# Patient Record
Sex: Female | Born: 1950 | Race: White | Hispanic: No | Marital: Married | Smoking: Never smoker
Health system: Southern US, Community
[De-identification: ages and names within clinical notes are randomized; demographics above are authoritative.]

## PROBLEM LIST (undated history)

## (undated) DIAGNOSIS — K219 Gastro-esophageal reflux disease without esophagitis: Secondary | ICD-10-CM

## (undated) DIAGNOSIS — D649 Anemia, unspecified: Secondary | ICD-10-CM

## (undated) DIAGNOSIS — Z87442 Personal history of urinary calculi: Secondary | ICD-10-CM

## (undated) DIAGNOSIS — M722 Plantar fascial fibromatosis: Secondary | ICD-10-CM

## (undated) DIAGNOSIS — E782 Mixed hyperlipidemia: Secondary | ICD-10-CM

## (undated) DIAGNOSIS — I10 Essential (primary) hypertension: Secondary | ICD-10-CM

## (undated) DIAGNOSIS — E119 Type 2 diabetes mellitus without complications: Secondary | ICD-10-CM

## (undated) DIAGNOSIS — H269 Unspecified cataract: Secondary | ICD-10-CM

## (undated) DIAGNOSIS — E785 Hyperlipidemia, unspecified: Secondary | ICD-10-CM

## (undated) DIAGNOSIS — R011 Cardiac murmur, unspecified: Secondary | ICD-10-CM

## (undated) DIAGNOSIS — I350 Nonrheumatic aortic (valve) stenosis: Secondary | ICD-10-CM

## (undated) DIAGNOSIS — T7840XA Allergy, unspecified, initial encounter: Secondary | ICD-10-CM

## (undated) DIAGNOSIS — R519 Headache, unspecified: Secondary | ICD-10-CM

## (undated) DIAGNOSIS — G4730 Sleep apnea, unspecified: Secondary | ICD-10-CM

## (undated) DIAGNOSIS — M7582 Other shoulder lesions, left shoulder: Secondary | ICD-10-CM

## (undated) DIAGNOSIS — M1990 Unspecified osteoarthritis, unspecified site: Secondary | ICD-10-CM

## (undated) HISTORY — DX: Sleep apnea, unspecified: G47.30

## (undated) HISTORY — DX: Mixed hyperlipidemia: E78.2

## (undated) HISTORY — PX: UPPER GASTROINTESTINAL ENDOSCOPY: SHX188

## (undated) HISTORY — DX: Other shoulder lesions, left shoulder: M75.82

## (undated) HISTORY — DX: Anemia, unspecified: D64.9

## (undated) HISTORY — DX: Plantar fascial fibromatosis: M72.2

## (undated) HISTORY — PX: NECK SURGERY: SHX720

## (undated) HISTORY — DX: Nonrheumatic aortic (valve) stenosis: I35.0

## (undated) HISTORY — PX: BILATERAL CARPAL TUNNEL RELEASE: SHX6508

## (undated) HISTORY — DX: Unspecified cataract: H26.9

## (undated) HISTORY — PX: COLONOSCOPY: SHX174

## (undated) HISTORY — DX: Type 2 diabetes mellitus without complications: E11.9

## (undated) HISTORY — DX: Essential (primary) hypertension: I10

## (undated) HISTORY — DX: Unspecified osteoarthritis, unspecified site: M19.90

## (undated) HISTORY — PX: WRIST SURGERY: SHX841

## (undated) HISTORY — PX: OTHER SURGICAL HISTORY: SHX169

## (undated) HISTORY — PX: ANTERIOR CERVICAL DECOMP/DISCECTOMY FUSION: SHX1161

## (undated) HISTORY — PX: TRIGGER FINGER RELEASE: SHX641

## (undated) HISTORY — DX: Gastro-esophageal reflux disease without esophagitis: K21.9

## (undated) HISTORY — DX: Allergy, unspecified, initial encounter: T78.40XA

## (undated) HISTORY — DX: Cardiac murmur, unspecified: R01.1

## (undated) HISTORY — DX: Hyperlipidemia, unspecified: E78.5

---

## 1997-11-21 ENCOUNTER — Other Ambulatory Visit: Admission: RE | Admit: 1997-11-21 | Discharge: 1997-11-21 | Payer: Self-pay | Admitting: Obstetrics and Gynecology

## 1998-08-11 ENCOUNTER — Ambulatory Visit (HOSPITAL_COMMUNITY): Admission: RE | Admit: 1998-08-11 | Discharge: 1998-08-11 | Payer: Self-pay | Admitting: Obstetrics and Gynecology

## 1999-04-03 ENCOUNTER — Other Ambulatory Visit: Admission: RE | Admit: 1999-04-03 | Discharge: 1999-04-03 | Payer: Self-pay | Admitting: Obstetrics and Gynecology

## 2000-02-25 ENCOUNTER — Encounter: Admission: RE | Admit: 2000-02-25 | Discharge: 2000-02-25 | Payer: Self-pay | Admitting: Family Medicine

## 2000-02-25 ENCOUNTER — Encounter: Payer: Self-pay | Admitting: Family Medicine

## 2000-04-15 ENCOUNTER — Other Ambulatory Visit: Admission: RE | Admit: 2000-04-15 | Discharge: 2000-04-15 | Payer: Self-pay | Admitting: Obstetrics and Gynecology

## 2001-04-15 ENCOUNTER — Other Ambulatory Visit: Admission: RE | Admit: 2001-04-15 | Discharge: 2001-04-15 | Payer: Self-pay | Admitting: Obstetrics & Gynecology

## 2002-04-30 ENCOUNTER — Other Ambulatory Visit: Admission: RE | Admit: 2002-04-30 | Discharge: 2002-04-30 | Payer: Self-pay | Admitting: Obstetrics and Gynecology

## 2002-05-02 ENCOUNTER — Encounter: Admission: RE | Admit: 2002-05-02 | Discharge: 2002-05-02 | Payer: Self-pay | Admitting: Family Medicine

## 2002-05-02 ENCOUNTER — Encounter: Payer: Self-pay | Admitting: Family Medicine

## 2002-06-23 ENCOUNTER — Encounter: Payer: Self-pay | Admitting: Neurosurgery

## 2002-06-25 ENCOUNTER — Encounter: Payer: Self-pay | Admitting: Neurosurgery

## 2002-06-25 ENCOUNTER — Observation Stay (HOSPITAL_COMMUNITY): Admission: RE | Admit: 2002-06-25 | Discharge: 2002-06-26 | Payer: Self-pay | Admitting: Neurosurgery

## 2003-08-16 ENCOUNTER — Other Ambulatory Visit: Admission: RE | Admit: 2003-08-16 | Discharge: 2003-08-16 | Payer: Self-pay | Admitting: Obstetrics and Gynecology

## 2004-08-24 ENCOUNTER — Other Ambulatory Visit: Admission: RE | Admit: 2004-08-24 | Discharge: 2004-08-24 | Payer: Self-pay | Admitting: Obstetrics and Gynecology

## 2007-11-13 ENCOUNTER — Encounter: Admission: RE | Admit: 2007-11-13 | Discharge: 2007-11-13 | Payer: Self-pay | Admitting: Orthopedic Surgery

## 2008-01-22 ENCOUNTER — Encounter: Admission: RE | Admit: 2008-01-22 | Discharge: 2008-01-22 | Payer: Self-pay | Admitting: Neurosurgery

## 2008-02-11 ENCOUNTER — Ambulatory Visit (HOSPITAL_COMMUNITY): Admission: RE | Admit: 2008-02-11 | Discharge: 2008-02-12 | Payer: Self-pay | Admitting: Neurosurgery

## 2008-10-21 ENCOUNTER — Ambulatory Visit (HOSPITAL_BASED_OUTPATIENT_CLINIC_OR_DEPARTMENT_OTHER): Admission: RE | Admit: 2008-10-21 | Discharge: 2008-10-21 | Payer: Self-pay | Admitting: Orthopedic Surgery

## 2009-03-07 ENCOUNTER — Ambulatory Visit (HOSPITAL_BASED_OUTPATIENT_CLINIC_OR_DEPARTMENT_OTHER): Admission: RE | Admit: 2009-03-07 | Discharge: 2009-03-07 | Payer: Self-pay | Admitting: Orthopedic Surgery

## 2009-06-06 ENCOUNTER — Encounter: Admission: RE | Admit: 2009-06-06 | Discharge: 2009-06-06 | Payer: Self-pay | Admitting: Orthopedic Surgery

## 2010-05-20 ENCOUNTER — Encounter: Payer: Self-pay | Admitting: Rheumatology

## 2010-07-16 ENCOUNTER — Emergency Department (HOSPITAL_COMMUNITY): Payer: 59

## 2010-07-16 ENCOUNTER — Emergency Department (HOSPITAL_COMMUNITY)
Admission: EM | Admit: 2010-07-16 | Discharge: 2010-07-16 | Disposition: A | Discharge status: Home / Self Care | Payer: 59 | Attending: Emergency Medicine | Admitting: Emergency Medicine

## 2010-07-16 DIAGNOSIS — M25476 Effusion, unspecified foot: Secondary | ICD-10-CM | POA: Insufficient documentation

## 2010-07-16 DIAGNOSIS — Y92009 Unspecified place in unspecified non-institutional (private) residence as the place of occurrence of the external cause: Secondary | ICD-10-CM | POA: Insufficient documentation

## 2010-07-16 DIAGNOSIS — Z79899 Other long term (current) drug therapy: Secondary | ICD-10-CM | POA: Insufficient documentation

## 2010-07-16 DIAGNOSIS — E7800 Pure hypercholesterolemia, unspecified: Secondary | ICD-10-CM | POA: Insufficient documentation

## 2010-07-16 DIAGNOSIS — I10 Essential (primary) hypertension: Secondary | ICD-10-CM | POA: Insufficient documentation

## 2010-07-16 DIAGNOSIS — M25473 Effusion, unspecified ankle: Secondary | ICD-10-CM | POA: Insufficient documentation

## 2010-07-16 DIAGNOSIS — M25579 Pain in unspecified ankle and joints of unspecified foot: Secondary | ICD-10-CM | POA: Insufficient documentation

## 2010-07-16 DIAGNOSIS — X500XXA Overexertion from strenuous movement or load, initial encounter: Secondary | ICD-10-CM | POA: Insufficient documentation

## 2010-07-16 DIAGNOSIS — S93409A Sprain of unspecified ligament of unspecified ankle, initial encounter: Secondary | ICD-10-CM | POA: Insufficient documentation

## 2010-07-16 DIAGNOSIS — M129 Arthropathy, unspecified: Secondary | ICD-10-CM | POA: Insufficient documentation

## 2010-07-16 DIAGNOSIS — M25519 Pain in unspecified shoulder: Secondary | ICD-10-CM | POA: Insufficient documentation

## 2010-08-01 LAB — BASIC METABOLIC PANEL
BUN: 17 mg/dL (ref 6–23)
CO2: 29 mEq/L (ref 19–32)
Calcium: 9 mg/dL (ref 8.4–10.5)
Chloride: 105 mEq/L (ref 96–112)
Creatinine, Ser: 0.83 mg/dL (ref 0.4–1.2)
GFR calc Af Amer: >60 mL/min (ref >60)
GFR calc non Af Amer: >60 mL/min (ref >60)
Glucose, Bld: 130 mg/dL — ABNORMAL HIGH (ref 70–99)
Potassium: 4.3 mEq/L (ref 3.5–5.1)
Sodium: 140 mEq/L (ref 135–145)

## 2010-08-01 LAB — POCT HEMOGLOBIN-HEMACUE: Hemoglobin: 12.9 g/dL (ref 12.0–15.0)

## 2010-08-06 LAB — BASIC METABOLIC PANEL
BUN: 11 mg/dL (ref 6–23)
CO2: 29 mEq/L (ref 19–32)
Calcium: 9.2 mg/dL (ref 8.4–10.5)
Chloride: 107 mEq/L (ref 96–112)
Creatinine, Ser: 0.65 mg/dL (ref 0.4–1.2)
GFR calc Af Amer: >60 mL/min (ref >60)
GFR calc non Af Amer: >60 mL/min (ref >60)
Glucose, Bld: 133 mg/dL — ABNORMAL HIGH (ref 70–99)
Potassium: 4 mEq/L (ref 3.5–5.1)
Sodium: 142 mEq/L (ref 135–145)

## 2010-08-06 LAB — POCT HEMOGLOBIN-HEMACUE: Hemoglobin: 13.6 g/dL (ref 12.0–15.0)

## 2010-09-11 NOTE — Op Note (Signed)
Megan Galvan, Megan Galvan               ACCOUNT NO.:  [id number]   MEDICAL RECORD NO.:  [id number]          PATIENT TYPE:  OIB   LOCATION:  3534                         FACILITY:  [facility]   PHYSICIAN:  Danae Orleans. Venetia Maxon, M.D.  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   PROCEDURES:  1. Exploration of fusion C5-C7 levels.  2. Anterior cervical decompression and fusion C4-C5 levels with      allograft, autograft, and anterior cervical plate.   SURGEON:  Danae Orleans. Venetia Maxon, MD   ASSISTANTS:  1. Georgiann Cocker, RN  2. Hewitt Shorts, MD   ANESTHESIA:  General endotracheal anesthesia.   ESTIMATED BLOOD LOSS:  Minimal.   COMPLICATIONS:  None.   DISPOSITION:  Recovery.   INDICATIONS:  Megan Galvan is a 60 year old woman who had previously  undergone anterior cervical decompression and fusion, C5-C7 levels.  She  has developed neck pain and bilateral upper extremity pain and weakness  and bilateral wrist pain and was found to have a herniated cervical disk  at C4-5 causing cord compression and her examination was consistent with  a cervical myelopathy.  It was elected to take her to surgery for  anterior cervical decompression and fusion at the C4-5 level with  exploration of previous fusion at C5-C7 levels.   PROCEDURE:  Megan Galvan was brought to the operating room.  Following  satisfactory and uncomplicated induction of general endotracheal  anesthesia and placement of intravenous lines, the patient was placed in  supine position on the operating table.  Her neck was maintained in  neutral alignment.  She was placed in 5 pounds Holter traction with her  head on  a horseshoe head holder.  Her anterior neck was then prepped and  draped in usual sterile fashion.  Previous incision was reopened on the  left side of midline, carried through platysmal layer, subplatysmal  dissection was performed exposing the anterior border of  sternocleidomastoid muscle.  Using blunt dissection, the carotid sheath  was kept lateral.  Trachea and esophagus kept medial exposing the  anterior cervical spine.  Dense scar tissue was then carefully dissected  exposing the previously placed anterior cervical plate.  This was  cleared of investing soft tissue and the previous Trinica plate was then  removed and all screws were removed.  The screw holes were waxed except  for the superior screw holes, which I planned to re-use.  The bone  grafts interfaces with the vertebrae were then carefully inspected.  There was no evidence of any pseudoarthrosis.  There did not appear to  be clefts suggestive of a nonunion, and I was not able to move the  vertebrae at each of these levels.  I therefore felt that the previous  fusion was solid.  I then turned my attention to the C4-5  level.  A  spinal needle was placed and intraoperative x-ray confirmed correct  orientation.  The longus colli muscles were taken down from the anterior  cervical spine using electrocautery and Key elevator.  Anterior  osteophytes were removed.  The interspace was incised and disk material  was removed in piecemeal fashion.  A shadow line retractor was placed  along with up and down retractors.  The interspace was cleared of  residual disk material.  A large amount of centrally herniated disk  material was removed.  Microscope was brought into the field.  Using  high-power microscopic visualization, the endplates were decorticated  with high-speed drill and uncinate spurs were removed.  The large  central disk herniation was removed.  The spinal cord dura was  decompressed, both cephalad and caudad and  overlying the neural  foramina.  Hemostasis was assured with Gelfoam-soaked in thrombin.  After trial sizing, a 7-mm allograft bone wedge was selected, fashioned  with a high-speed drill, packed with morselized bone autograft, and  demineralized bone matrix, inserted in the interspace, and countersunk  appropriately.  A 16-mm Trestle anterior cervical plate was then affixed  to the anterior cervical spine using variable-angled 14-mm screws.  Rescue screws were used at the previously utilized C5 screw holes and 4-  mm screws were placed at C4.  All screws had excellent purchase and  locking mechanisms were engaged.  Traction weight was removed prior to  placing the screws.  Wound was irrigated.  Soft tissues were inspected  and found to be in good repair.  Hemostasis assured with bipolar  electrocautery.  There appeared to be excellent hemostasis.  The final x-  ray demonstrated well-positioned interbody graft in anterior cervical  plate.  The platysma layer was closed with 3-0 Vicryl sutures.  Skin  edges were approximated with 3-0 Vicryl interrupted inverted sutures.  The wound was dressed with Dermabond.  The patient was extubated in the  operating room and taken to the recovery in stable and satisfactory  condition having tolerated the operation well.  Counts were correct at  the end of the case.      Danae Orleans. Venetia Maxon, M.D.  Electronically Signed     JDS/MEDQ  D:  02/11/2008  T:  02/11/2008  Job:  [id number]

## 2010-09-11 NOTE — Op Note (Signed)
Megan Galvan, GARRETSON               ACCOUNT NO.:  [id number]   MEDICAL RECORD NO.:  [id number]          PATIENT TYPE:  AMB   LOCATION:  DSC                          FACILITY:  [facility]   PHYSICIAN:  Cindee Salt, M.D.       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   POSTOPERATIVE DIAGNOSE:  1. Extensor carpi ulnaris subluxation and tenosynovitis of right wrist      with triangular fibrocartilage complex injury.  2. Dorsal wrist ganglion.   OPERATION:  Arthroscopy of right wrist, excision of cyst, debridement of  triangular fibrocartilage complex, reconstruction of extensor carpi  ulnaris sheath, and tenosynovectomy.   SURGEON:  Cindee Salt, MD   ANESTHESIA:  General.   ANESTHESIOLOGIST:  Dr. Jean Rosenthal.   HISTORY:  The patient is a 60 year old female with a history of injury  to her wrist.  She has had ulnar wrist pain.  She has had bone scan done  which is positive.  She has had MRI which reveals leakage of contrast  agent from the wrist joint into the ECU tendon sheath.  She has not  responded to conservative treatment including anti-inflammatory,  splinting, and injections.  She is admitted now for arthroscopy of her  wrist, debridement, possible repair of TFCC, and reconstruction of ECU  sheath as dictated by findings.  She is aware of risks and complications  including infection, recurrence injury to arteries, nerves, and tendons,  incomplete relief of symptoms, and dystrophy.  In the preoperative area,  the patient is seen.  The extremity is marked by both the patient and  surgeon.  Antibiotic is given.   PROCEDURE:  The patient was brought to the operating room where a  general anesthetic was carried out without difficulty under the  direction of Dr.  Jean Rosenthal.  She was prepped using DuraPrep, supine  position, right arm free.  A 3-minute dry time was allowed.  The time-  out was taken confirming the patient and procedure.  She was then  draped.  She was placed in the arc arthroscopy tower.  The 10 pounds of  traction was applied.  The joint was inflated to 3/4 portal.  A  transverse incision was made, deepened with hemostat.  Blunt trocar was  used to enter the joint.  The joint was inspected.  The volar radial  wrist ligaments were intact.  The scapholunate ligament was intact.  The  joint was fairly tight.  An irrigation catheter was placed using an 18  gauge needle and 6U, a 4/5 portal opened after localization with a 22  gauge needle.  A blunt probe was inserted.  This allowed the joint to be  opened and the scope to be brought to the ulnar side.  Triangle  fibrocartilage complex was found to be intact and normal trampoline  effect was palpable with placement of a probe.  A very significant ulnar  synovitis was present.  This was then removed with Merton Border wand full  radius shaver.  Bleeders were electrocauterized.  An area of the TFCC  appeared to be attached.  The scope was removed and attempt was made to  enter the distal radioulnar joint portal which was unsuccessful.  The  1.9-mm scope was used, but was unable to be entered in the joint and as  such it was felt the foveal attachment was probably intact.  This was  abandoned after several attempts.  This was done after inflating the  joint with a 22 gauge needle.  This was done from the proximal and  distal radioulnar joint portal dorsally.  The scope was then introduced  in the ulnar side of the wrist.  The small area of abrasion on the ulnar  aspect of the lunate was present, but no full cartilage loss.  The  lunotriquetral joint was intact.  The dorsal carpal ligaments were then  inspected.  A ganglion cyst was noted at the junction of scapholunate.  A full radius shaver was  introduced and this was removed.  Scope was  reintroduced into the 3/4 portal.  A continuation of the debridement of  the synovial tissue on the ulnar side, no discrete detachment of the  triangle fibrocartilage complex was noted.  The area was then shrunk,  maintaining excellent flow throughout the procedure.  The scopes were  removed.  The limb was exsanguinated with an Esmarch bandage after  removal from the arthroscopy tower.  A tourniquet placed high on the arm  was inflated to 250 mmHg.  A longitudinal incision was made on the ulnar  aspect of the wrist and carried down through the subcutaneous tissue.  Dorsal sensory branch of the ulnar nerve was protected.  The incision  was then made after creating a flap of retinaculum volarly.  This was  left attached to the dorsal aspect.  The ECU sheath was opened and a  moderate tenosynovitis was present.  This was removed with a rongeur.  No further unification was noted with the joint.  The ECU sheath was  then reconstructed, sutured into position, stabilizing the ECU tendon in  its groove.  This was performed with figure-of-eight 2-0 Vicryl sutures.  The old sheath was then repaired over the reconstruction and sutured  into position dorsally with the 2-0 Vicryl sutures.  This was done after  copiously irrigating the area with saline.  The subcutaneous tissue was  then closed with interrupted 4-0 Vicryl sutures, maintaining the wrist  in a pronated position.  The skin was closed with interrupted 4-0 Vicryl  Rapide sutures along with the portals.  A sterile compressive dressing  and long-arm splint were applied maintaining the elbow in slight flexion  and in pronation.  On deflation of the tourniquet, all fingers  immediately pinked.  She was taken to the recovery room for observation  in satisfactory condition.  She will be discharged home to return to the  [facility] in 1 week on Percocet.            ______________________________  Cindee Salt, M.D.     GK/MEDQ  D:  10/21/2008  T:  10/22/2008  Job:  [id number]

## 2010-09-14 NOTE — Op Note (Signed)
Megan Galvan                         ACCOUNT NO.:  [id number]   MEDICAL RECORD NO.:  [id number]                   PATIENT TYPE:  INP   LOCATION:  3172                                 FACILITY:  [facility]   PHYSICIAN:  Danae Orleans. Venetia Maxon, M.D.               DATE OF BIRTH:  [birth year]   DATE OF PROCEDURE:  06/25/2002  DATE OF DISCHARGE:                                 OPERATIVE REPORT   PREOPERATIVE DIAGNOSES:  1. Herniated cervical disk.  2. Cervical spondylosis.  3. Degenerative disk disease.  4. Cervical radiculopathy at C5-6 and C6-7 levels.   POSTOPERATIVE DIAGNOSES:  1. Herniated cervical disk.  2. Cervical spondylosis.  3. Degenerative disk disease.  4. Cervical radiculopathy at C5-6 and C6-7 levels.   PROCEDURE:  Anterior cervical decompression and fusion, C5-6 and C6-7  levels; with allograft bone graft and anterior cervical plate.   SURGEON:  Danae Orleans. Venetia Maxon, M.D.   ASSISTANT:  Hewitt Shorts, M.D.   ANESTHESIA:  General endotracheal.   ESTIMATED BLOOD LOSS:  300 cc.   COMPLICATIONS:  None.   DISPOSITION:  To recovery in good condition.   INDICATIONS:  The patient is Galvan 60 year old woman with central disk  herniation at C6-7 and biforaminal stenosis at C6-7 with left upper  extremity pain, and left greater than right foraminal stenosis of the C6-7  level.  Biforaminal stenosis at C5-6, with evidence of biceps and triceps  weakness bilaterally.  It was elected to  undergo surgery for anterior  decompression and fusion of the C5-6 and C6-7 levels.   DESCRIPTION OF PROCEDURE:  The patient is brought to the operating room.  Following the satisfactory and uncomplicated induction of general  endotracheal anesthesia, and placement of intravenous line, the patient was  placed in the supine position on the operating room table and the neck was  placed in slight extension.  She was placed in 10 pounds of Holter traction.  Her anterior neck was then prepped and  draped in the usual sterile fashion.  The area of planned incision was infiltrated with 0.25% Marcaine with  lidocaine 1:200,000 epinephrine.   The incision was made in the midline, carried through anterior border of the  sternocleidomastoid muscle and the lower neck crease on the left side.  This  was carried sharply through the platysmal area; subplatysmal dissection was  performed, exposing the anterior border of the sternocleidomastoid muscle --  using blunt dissection.  The carotid sheath was kept lateral, trachea and  esophagus kept medial and the anterior cervical spine was identified.  Because of the patient's large body habitus, it was elected to place an  identifying spinal needle at the C4-5 level, and this was visualized on  intraoperative x-ray.  Subsequently the longus colli muscles were taken down  from the anterior cervical spine from C5 through C7 levels bilaterally; this  was done with electrocautery and Key elevator.  Galvan self-retaining retractor  was then placed, __________ retraction.  The Leksell rongeur was used to  remove large ventral osteophytes at the C5-6 and C6-7 levels.   Subsequently, disk space spreader was placed additionally at C6-7 and  subsequently the C5-6 level.  The highly degenerated disk material was  removed; there was marked hypertrophy of the uncinate processes, which were  drilled down using an Anspach drill and AT equivalent bur.  Subsequently,  markedly degenerated disk at the C6-7 level was then removed.  Ultimately  the posterior longitudinal ligament was incised and removed in piecemeal  fashion, resulting in significant decompression of the cervical spinal cord  dura and C7 nerve roots.   Hemostasis was assured with Gelfoam soaked in thrombin.  An 8 mm tricortical  iliac crest bone graft was selected, sized and found to fit appropriately;  was then entered into the anterior of interspace and countersunk  appropriately.   Attention was  then turned to C5-6 level, where there was marked degeneration  of the disk space as well.  Uncinate spurs were drilled down and the central  spinal cord dura was decompressed with the longitudinal ligament being  removed in piecemeal fashion.  Both C6 nerve roots were widely decompressed  as they extended up the neuroforaminal.  Central spinal cord dura was also  decompressed.  Hemostasis was again assured with Gelfoam soaked in thrombin.  Galvan similarly sized bone graft was placed.   Galvan 46 mm anterior cervical Trinica anterior cervical plate was then affixed  to the anterior cervical spine using 14 mm very long screws (two at C5, two  at C6, two at C7).  All screws had excellent purchase.  Locking nuts added  and engaged.  Final x-ray confirmed positioning of bone graft and anterior  cervical plate.   The wound was then copiously irrigated with Bacitracin and saline.  Soft  tissues were inspected and found to be good to repair.  The platysmal area  was then closed with 3-0 Vicryl sutures.  The skin edges were reapproximated  with running 4-0 Vicryl subcuticular stitch.  The wound was dressed with  Dermabond.   The patient was extubated in the operating room and taken to the recovery  room in stable satisfactory condition, having tolerated her operation well.  Counts were correct at the end of the case.                                               Danae Orleans. Venetia Maxon, M.D.    JDS/MEDQ  D:  06/25/2002  T:  06/25/2002  Job:  [id number]

## 2011-01-29 LAB — BASIC METABOLIC PANEL
BUN: 11
CO2: 30
Calcium: 9.5
Chloride: 104
Creatinine, Ser: 0.71
GFR calc Af Amer: >60
GFR calc non Af Amer: >60
Glucose, Bld: 94
Potassium: 3.6
Sodium: 141

## 2011-01-29 LAB — CBC
HCT: 41.8
Hemoglobin: 14.1
MCHC: 33.7
MCV: 98.1
Platelets: 261
RBC: 4.26
RDW: 13.7
WBC: 6.4

## 2011-08-22 HISTORY — PX: COLONOSCOPY: SHX174

## 2015-07-07 DIAGNOSIS — E782 Mixed hyperlipidemia: Secondary | ICD-10-CM | POA: Diagnosis not present

## 2015-07-07 DIAGNOSIS — M25512 Pain in left shoulder: Secondary | ICD-10-CM | POA: Diagnosis not present

## 2015-07-07 DIAGNOSIS — E1140 Type 2 diabetes mellitus with diabetic neuropathy, unspecified: Secondary | ICD-10-CM | POA: Diagnosis not present

## 2015-07-07 DIAGNOSIS — I10 Essential (primary) hypertension: Secondary | ICD-10-CM | POA: Diagnosis not present

## 2015-07-07 DIAGNOSIS — F5101 Primary insomnia: Secondary | ICD-10-CM | POA: Diagnosis not present

## 2015-08-03 DIAGNOSIS — M25512 Pain in left shoulder: Secondary | ICD-10-CM | POA: Diagnosis not present

## 2015-08-03 DIAGNOSIS — M19012 Primary osteoarthritis, left shoulder: Secondary | ICD-10-CM | POA: Diagnosis not present

## 2015-08-21 DIAGNOSIS — M7552 Bursitis of left shoulder: Secondary | ICD-10-CM | POA: Diagnosis not present

## 2015-10-02 DIAGNOSIS — M7552 Bursitis of left shoulder: Secondary | ICD-10-CM | POA: Diagnosis not present

## 2015-10-13 DIAGNOSIS — E1142 Type 2 diabetes mellitus with diabetic polyneuropathy: Secondary | ICD-10-CM | POA: Diagnosis not present

## 2015-10-13 DIAGNOSIS — I10 Essential (primary) hypertension: Secondary | ICD-10-CM | POA: Diagnosis not present

## 2015-10-13 DIAGNOSIS — E782 Mixed hyperlipidemia: Secondary | ICD-10-CM | POA: Diagnosis not present

## 2015-10-13 DIAGNOSIS — E1140 Type 2 diabetes mellitus with diabetic neuropathy, unspecified: Secondary | ICD-10-CM | POA: Diagnosis not present

## 2015-10-13 DIAGNOSIS — F5101 Primary insomnia: Secondary | ICD-10-CM | POA: Diagnosis not present

## 2015-10-13 DIAGNOSIS — M25512 Pain in left shoulder: Secondary | ICD-10-CM | POA: Diagnosis not present

## 2015-10-16 DIAGNOSIS — E1140 Type 2 diabetes mellitus with diabetic neuropathy, unspecified: Secondary | ICD-10-CM | POA: Diagnosis not present

## 2015-11-15 DIAGNOSIS — M7552 Bursitis of left shoulder: Secondary | ICD-10-CM | POA: Diagnosis not present

## 2015-11-15 DIAGNOSIS — M25512 Pain in left shoulder: Secondary | ICD-10-CM | POA: Diagnosis not present

## 2015-11-22 DIAGNOSIS — M75112 Incomplete rotator cuff tear or rupture of left shoulder, not specified as traumatic: Secondary | ICD-10-CM | POA: Diagnosis not present

## 2015-11-22 DIAGNOSIS — M25512 Pain in left shoulder: Secondary | ICD-10-CM | POA: Diagnosis not present

## 2015-11-27 DIAGNOSIS — M75101 Unspecified rotator cuff tear or rupture of right shoulder, not specified as traumatic: Secondary | ICD-10-CM | POA: Diagnosis not present

## 2015-11-27 DIAGNOSIS — M7552 Bursitis of left shoulder: Secondary | ICD-10-CM | POA: Diagnosis not present

## 2015-11-30 DIAGNOSIS — I10 Essential (primary) hypertension: Secondary | ICD-10-CM | POA: Diagnosis not present

## 2015-12-12 DIAGNOSIS — Z01810 Encounter for preprocedural cardiovascular examination: Secondary | ICD-10-CM | POA: Diagnosis not present

## 2015-12-15 DIAGNOSIS — Z01818 Encounter for other preprocedural examination: Secondary | ICD-10-CM | POA: Diagnosis not present

## 2015-12-15 DIAGNOSIS — I517 Cardiomegaly: Secondary | ICD-10-CM | POA: Diagnosis not present

## 2015-12-22 DIAGNOSIS — I10 Essential (primary) hypertension: Secondary | ICD-10-CM | POA: Diagnosis not present

## 2015-12-22 DIAGNOSIS — Z79899 Other long term (current) drug therapy: Secondary | ICD-10-CM | POA: Diagnosis not present

## 2015-12-22 DIAGNOSIS — G8918 Other acute postprocedural pain: Secondary | ICD-10-CM | POA: Diagnosis not present

## 2015-12-22 DIAGNOSIS — M19012 Primary osteoarthritis, left shoulder: Secondary | ICD-10-CM | POA: Diagnosis not present

## 2015-12-22 DIAGNOSIS — M75102 Unspecified rotator cuff tear or rupture of left shoulder, not specified as traumatic: Secondary | ICD-10-CM | POA: Diagnosis not present

## 2015-12-22 DIAGNOSIS — M75101 Unspecified rotator cuff tear or rupture of right shoulder, not specified as traumatic: Secondary | ICD-10-CM | POA: Diagnosis not present

## 2015-12-22 DIAGNOSIS — M25512 Pain in left shoulder: Secondary | ICD-10-CM | POA: Diagnosis not present

## 2015-12-22 DIAGNOSIS — M7542 Impingement syndrome of left shoulder: Secondary | ICD-10-CM | POA: Diagnosis not present

## 2015-12-22 DIAGNOSIS — E119 Type 2 diabetes mellitus without complications: Secondary | ICD-10-CM | POA: Diagnosis not present

## 2015-12-22 DIAGNOSIS — M7552 Bursitis of left shoulder: Secondary | ICD-10-CM | POA: Diagnosis not present

## 2015-12-22 DIAGNOSIS — E7800 Pure hypercholesterolemia, unspecified: Secondary | ICD-10-CM | POA: Diagnosis not present

## 2015-12-22 DIAGNOSIS — G8929 Other chronic pain: Secondary | ICD-10-CM | POA: Diagnosis not present

## 2015-12-22 DIAGNOSIS — Z7982 Long term (current) use of aspirin: Secondary | ICD-10-CM | POA: Diagnosis not present

## 2015-12-25 DIAGNOSIS — M25412 Effusion, left shoulder: Secondary | ICD-10-CM | POA: Diagnosis not present

## 2015-12-25 DIAGNOSIS — M25612 Stiffness of left shoulder, not elsewhere classified: Secondary | ICD-10-CM | POA: Diagnosis not present

## 2015-12-25 DIAGNOSIS — M25512 Pain in left shoulder: Secondary | ICD-10-CM | POA: Diagnosis not present

## 2015-12-27 DIAGNOSIS — M25612 Stiffness of left shoulder, not elsewhere classified: Secondary | ICD-10-CM | POA: Diagnosis not present

## 2015-12-27 DIAGNOSIS — M25412 Effusion, left shoulder: Secondary | ICD-10-CM | POA: Diagnosis not present

## 2015-12-27 DIAGNOSIS — M25512 Pain in left shoulder: Secondary | ICD-10-CM | POA: Diagnosis not present

## 2016-01-03 DIAGNOSIS — M25612 Stiffness of left shoulder, not elsewhere classified: Secondary | ICD-10-CM | POA: Diagnosis not present

## 2016-01-03 DIAGNOSIS — M25412 Effusion, left shoulder: Secondary | ICD-10-CM | POA: Diagnosis not present

## 2016-01-03 DIAGNOSIS — M25512 Pain in left shoulder: Secondary | ICD-10-CM | POA: Diagnosis not present

## 2016-01-05 DIAGNOSIS — M25412 Effusion, left shoulder: Secondary | ICD-10-CM | POA: Diagnosis not present

## 2016-01-05 DIAGNOSIS — M25612 Stiffness of left shoulder, not elsewhere classified: Secondary | ICD-10-CM | POA: Diagnosis not present

## 2016-01-05 DIAGNOSIS — M25512 Pain in left shoulder: Secondary | ICD-10-CM | POA: Diagnosis not present

## 2016-01-09 DIAGNOSIS — M25512 Pain in left shoulder: Secondary | ICD-10-CM | POA: Diagnosis not present

## 2016-01-09 DIAGNOSIS — M25612 Stiffness of left shoulder, not elsewhere classified: Secondary | ICD-10-CM | POA: Diagnosis not present

## 2016-01-09 DIAGNOSIS — M25412 Effusion, left shoulder: Secondary | ICD-10-CM | POA: Diagnosis not present

## 2016-01-11 DIAGNOSIS — M25412 Effusion, left shoulder: Secondary | ICD-10-CM | POA: Diagnosis not present

## 2016-01-11 DIAGNOSIS — M25512 Pain in left shoulder: Secondary | ICD-10-CM | POA: Diagnosis not present

## 2016-01-11 DIAGNOSIS — M25612 Stiffness of left shoulder, not elsewhere classified: Secondary | ICD-10-CM | POA: Diagnosis not present

## 2016-01-16 DIAGNOSIS — E782 Mixed hyperlipidemia: Secondary | ICD-10-CM | POA: Diagnosis not present

## 2016-01-16 DIAGNOSIS — Z23 Encounter for immunization: Secondary | ICD-10-CM | POA: Diagnosis not present

## 2016-01-16 DIAGNOSIS — E1140 Type 2 diabetes mellitus with diabetic neuropathy, unspecified: Secondary | ICD-10-CM | POA: Diagnosis not present

## 2016-01-16 DIAGNOSIS — F5101 Primary insomnia: Secondary | ICD-10-CM | POA: Diagnosis not present

## 2016-01-16 DIAGNOSIS — I10 Essential (primary) hypertension: Secondary | ICD-10-CM | POA: Diagnosis not present

## 2016-01-17 DIAGNOSIS — M25612 Stiffness of left shoulder, not elsewhere classified: Secondary | ICD-10-CM | POA: Diagnosis not present

## 2016-01-17 DIAGNOSIS — M25412 Effusion, left shoulder: Secondary | ICD-10-CM | POA: Diagnosis not present

## 2016-01-17 DIAGNOSIS — M25512 Pain in left shoulder: Secondary | ICD-10-CM | POA: Diagnosis not present

## 2016-01-19 DIAGNOSIS — M25412 Effusion, left shoulder: Secondary | ICD-10-CM | POA: Diagnosis not present

## 2016-01-19 DIAGNOSIS — M25612 Stiffness of left shoulder, not elsewhere classified: Secondary | ICD-10-CM | POA: Diagnosis not present

## 2016-01-19 DIAGNOSIS — M25512 Pain in left shoulder: Secondary | ICD-10-CM | POA: Diagnosis not present

## 2016-01-22 DIAGNOSIS — M25612 Stiffness of left shoulder, not elsewhere classified: Secondary | ICD-10-CM | POA: Diagnosis not present

## 2016-01-22 DIAGNOSIS — M25512 Pain in left shoulder: Secondary | ICD-10-CM | POA: Diagnosis not present

## 2016-01-22 DIAGNOSIS — M25412 Effusion, left shoulder: Secondary | ICD-10-CM | POA: Diagnosis not present

## 2016-01-25 DIAGNOSIS — M25512 Pain in left shoulder: Secondary | ICD-10-CM | POA: Diagnosis not present

## 2016-01-25 DIAGNOSIS — M25612 Stiffness of left shoulder, not elsewhere classified: Secondary | ICD-10-CM | POA: Diagnosis not present

## 2016-01-25 DIAGNOSIS — M25412 Effusion, left shoulder: Secondary | ICD-10-CM | POA: Diagnosis not present

## 2016-01-29 DIAGNOSIS — M25612 Stiffness of left shoulder, not elsewhere classified: Secondary | ICD-10-CM | POA: Diagnosis not present

## 2016-01-29 DIAGNOSIS — M25412 Effusion, left shoulder: Secondary | ICD-10-CM | POA: Diagnosis not present

## 2016-01-29 DIAGNOSIS — M25512 Pain in left shoulder: Secondary | ICD-10-CM | POA: Diagnosis not present

## 2016-01-31 DIAGNOSIS — M25512 Pain in left shoulder: Secondary | ICD-10-CM | POA: Diagnosis not present

## 2016-01-31 DIAGNOSIS — M25412 Effusion, left shoulder: Secondary | ICD-10-CM | POA: Diagnosis not present

## 2016-01-31 DIAGNOSIS — M25612 Stiffness of left shoulder, not elsewhere classified: Secondary | ICD-10-CM | POA: Diagnosis not present

## 2016-02-06 DIAGNOSIS — M25412 Effusion, left shoulder: Secondary | ICD-10-CM | POA: Diagnosis not present

## 2016-02-06 DIAGNOSIS — M25612 Stiffness of left shoulder, not elsewhere classified: Secondary | ICD-10-CM | POA: Diagnosis not present

## 2016-02-06 DIAGNOSIS — M25512 Pain in left shoulder: Secondary | ICD-10-CM | POA: Diagnosis not present

## 2016-02-09 DIAGNOSIS — M25512 Pain in left shoulder: Secondary | ICD-10-CM | POA: Diagnosis not present

## 2016-02-09 DIAGNOSIS — M25412 Effusion, left shoulder: Secondary | ICD-10-CM | POA: Diagnosis not present

## 2016-02-09 DIAGNOSIS — M25612 Stiffness of left shoulder, not elsewhere classified: Secondary | ICD-10-CM | POA: Diagnosis not present

## 2016-02-13 DIAGNOSIS — M25612 Stiffness of left shoulder, not elsewhere classified: Secondary | ICD-10-CM | POA: Diagnosis not present

## 2016-02-13 DIAGNOSIS — M25412 Effusion, left shoulder: Secondary | ICD-10-CM | POA: Diagnosis not present

## 2016-02-13 DIAGNOSIS — M25512 Pain in left shoulder: Secondary | ICD-10-CM | POA: Diagnosis not present

## 2016-02-15 DIAGNOSIS — M25412 Effusion, left shoulder: Secondary | ICD-10-CM | POA: Diagnosis not present

## 2016-02-15 DIAGNOSIS — M25512 Pain in left shoulder: Secondary | ICD-10-CM | POA: Diagnosis not present

## 2016-02-15 DIAGNOSIS — M25612 Stiffness of left shoulder, not elsewhere classified: Secondary | ICD-10-CM | POA: Diagnosis not present

## 2016-02-19 DIAGNOSIS — M25512 Pain in left shoulder: Secondary | ICD-10-CM | POA: Diagnosis not present

## 2016-02-19 DIAGNOSIS — M25412 Effusion, left shoulder: Secondary | ICD-10-CM | POA: Diagnosis not present

## 2016-02-19 DIAGNOSIS — M25612 Stiffness of left shoulder, not elsewhere classified: Secondary | ICD-10-CM | POA: Diagnosis not present

## 2016-02-22 DIAGNOSIS — M25512 Pain in left shoulder: Secondary | ICD-10-CM | POA: Diagnosis not present

## 2016-02-22 DIAGNOSIS — M25612 Stiffness of left shoulder, not elsewhere classified: Secondary | ICD-10-CM | POA: Diagnosis not present

## 2016-02-22 DIAGNOSIS — M25412 Effusion, left shoulder: Secondary | ICD-10-CM | POA: Diagnosis not present

## 2016-02-26 DIAGNOSIS — Z23 Encounter for immunization: Secondary | ICD-10-CM | POA: Diagnosis not present

## 2016-02-26 DIAGNOSIS — Z0001 Encounter for general adult medical examination with abnormal findings: Secondary | ICD-10-CM | POA: Diagnosis not present

## 2016-02-26 DIAGNOSIS — Z1239 Encounter for other screening for malignant neoplasm of breast: Secondary | ICD-10-CM | POA: Diagnosis not present

## 2016-02-26 DIAGNOSIS — Z1211 Encounter for screening for malignant neoplasm of colon: Secondary | ICD-10-CM | POA: Diagnosis not present

## 2016-02-28 DIAGNOSIS — M25612 Stiffness of left shoulder, not elsewhere classified: Secondary | ICD-10-CM | POA: Diagnosis not present

## 2016-02-28 DIAGNOSIS — M25412 Effusion, left shoulder: Secondary | ICD-10-CM | POA: Diagnosis not present

## 2016-02-28 DIAGNOSIS — M7501 Adhesive capsulitis of right shoulder: Secondary | ICD-10-CM | POA: Diagnosis not present

## 2016-02-28 DIAGNOSIS — M25512 Pain in left shoulder: Secondary | ICD-10-CM | POA: Diagnosis not present

## 2016-02-29 DIAGNOSIS — I10 Essential (primary) hypertension: Secondary | ICD-10-CM | POA: Diagnosis not present

## 2016-02-29 DIAGNOSIS — M25612 Stiffness of left shoulder, not elsewhere classified: Secondary | ICD-10-CM | POA: Diagnosis not present

## 2016-02-29 DIAGNOSIS — M25412 Effusion, left shoulder: Secondary | ICD-10-CM | POA: Diagnosis not present

## 2016-02-29 DIAGNOSIS — M25512 Pain in left shoulder: Secondary | ICD-10-CM | POA: Diagnosis not present

## 2016-03-04 DIAGNOSIS — M25512 Pain in left shoulder: Secondary | ICD-10-CM | POA: Diagnosis not present

## 2016-03-04 DIAGNOSIS — M25412 Effusion, left shoulder: Secondary | ICD-10-CM | POA: Diagnosis not present

## 2016-03-04 DIAGNOSIS — M25612 Stiffness of left shoulder, not elsewhere classified: Secondary | ICD-10-CM | POA: Diagnosis not present

## 2016-03-07 DIAGNOSIS — M25512 Pain in left shoulder: Secondary | ICD-10-CM | POA: Diagnosis not present

## 2016-03-07 DIAGNOSIS — M25612 Stiffness of left shoulder, not elsewhere classified: Secondary | ICD-10-CM | POA: Diagnosis not present

## 2016-03-07 DIAGNOSIS — M25412 Effusion, left shoulder: Secondary | ICD-10-CM | POA: Diagnosis not present

## 2016-03-11 DIAGNOSIS — M25412 Effusion, left shoulder: Secondary | ICD-10-CM | POA: Diagnosis not present

## 2016-03-11 DIAGNOSIS — M25612 Stiffness of left shoulder, not elsewhere classified: Secondary | ICD-10-CM | POA: Diagnosis not present

## 2016-03-11 DIAGNOSIS — M25512 Pain in left shoulder: Secondary | ICD-10-CM | POA: Diagnosis not present

## 2016-03-14 DIAGNOSIS — M25612 Stiffness of left shoulder, not elsewhere classified: Secondary | ICD-10-CM | POA: Diagnosis not present

## 2016-03-14 DIAGNOSIS — M25512 Pain in left shoulder: Secondary | ICD-10-CM | POA: Diagnosis not present

## 2016-03-14 DIAGNOSIS — M25412 Effusion, left shoulder: Secondary | ICD-10-CM | POA: Diagnosis not present

## 2016-03-15 DIAGNOSIS — I10 Essential (primary) hypertension: Secondary | ICD-10-CM | POA: Diagnosis not present

## 2016-03-19 DIAGNOSIS — M25612 Stiffness of left shoulder, not elsewhere classified: Secondary | ICD-10-CM | POA: Diagnosis not present

## 2016-03-19 DIAGNOSIS — M25412 Effusion, left shoulder: Secondary | ICD-10-CM | POA: Diagnosis not present

## 2016-03-19 DIAGNOSIS — M25512 Pain in left shoulder: Secondary | ICD-10-CM | POA: Diagnosis not present

## 2016-03-20 DIAGNOSIS — Z1211 Encounter for screening for malignant neoplasm of colon: Secondary | ICD-10-CM | POA: Diagnosis not present

## 2016-03-25 DIAGNOSIS — M25612 Stiffness of left shoulder, not elsewhere classified: Secondary | ICD-10-CM | POA: Diagnosis not present

## 2016-03-25 DIAGNOSIS — M25512 Pain in left shoulder: Secondary | ICD-10-CM | POA: Diagnosis not present

## 2016-03-25 DIAGNOSIS — M25412 Effusion, left shoulder: Secondary | ICD-10-CM | POA: Diagnosis not present

## 2016-03-26 DIAGNOSIS — M25512 Pain in left shoulder: Secondary | ICD-10-CM | POA: Diagnosis not present

## 2016-03-26 DIAGNOSIS — M25412 Effusion, left shoulder: Secondary | ICD-10-CM | POA: Diagnosis not present

## 2016-03-26 DIAGNOSIS — M25612 Stiffness of left shoulder, not elsewhere classified: Secondary | ICD-10-CM | POA: Diagnosis not present

## 2016-03-29 DIAGNOSIS — Z1231 Encounter for screening mammogram for malignant neoplasm of breast: Secondary | ICD-10-CM | POA: Diagnosis not present

## 2016-04-01 DIAGNOSIS — M25512 Pain in left shoulder: Secondary | ICD-10-CM | POA: Diagnosis not present

## 2016-04-01 DIAGNOSIS — M25412 Effusion, left shoulder: Secondary | ICD-10-CM | POA: Diagnosis not present

## 2016-04-01 DIAGNOSIS — M25612 Stiffness of left shoulder, not elsewhere classified: Secondary | ICD-10-CM | POA: Diagnosis not present

## 2016-04-04 DIAGNOSIS — M25512 Pain in left shoulder: Secondary | ICD-10-CM | POA: Diagnosis not present

## 2016-04-04 DIAGNOSIS — M25412 Effusion, left shoulder: Secondary | ICD-10-CM | POA: Diagnosis not present

## 2016-04-04 DIAGNOSIS — M25612 Stiffness of left shoulder, not elsewhere classified: Secondary | ICD-10-CM | POA: Diagnosis not present

## 2016-04-09 DIAGNOSIS — M25512 Pain in left shoulder: Secondary | ICD-10-CM | POA: Diagnosis not present

## 2016-04-09 DIAGNOSIS — M25612 Stiffness of left shoulder, not elsewhere classified: Secondary | ICD-10-CM | POA: Diagnosis not present

## 2016-04-09 DIAGNOSIS — M25412 Effusion, left shoulder: Secondary | ICD-10-CM | POA: Diagnosis not present

## 2016-04-12 DIAGNOSIS — M25612 Stiffness of left shoulder, not elsewhere classified: Secondary | ICD-10-CM | POA: Diagnosis not present

## 2016-04-12 DIAGNOSIS — M25412 Effusion, left shoulder: Secondary | ICD-10-CM | POA: Diagnosis not present

## 2016-04-12 DIAGNOSIS — M25512 Pain in left shoulder: Secondary | ICD-10-CM | POA: Diagnosis not present

## 2016-04-16 DIAGNOSIS — M25412 Effusion, left shoulder: Secondary | ICD-10-CM | POA: Diagnosis not present

## 2016-04-16 DIAGNOSIS — M25512 Pain in left shoulder: Secondary | ICD-10-CM | POA: Diagnosis not present

## 2016-04-16 DIAGNOSIS — M25612 Stiffness of left shoulder, not elsewhere classified: Secondary | ICD-10-CM | POA: Diagnosis not present

## 2016-04-25 DIAGNOSIS — M25412 Effusion, left shoulder: Secondary | ICD-10-CM | POA: Diagnosis not present

## 2016-04-25 DIAGNOSIS — M25612 Stiffness of left shoulder, not elsewhere classified: Secondary | ICD-10-CM | POA: Diagnosis not present

## 2016-04-25 DIAGNOSIS — M25512 Pain in left shoulder: Secondary | ICD-10-CM | POA: Diagnosis not present

## 2016-05-06 DIAGNOSIS — J018 Other acute sinusitis: Secondary | ICD-10-CM | POA: Diagnosis not present

## 2016-05-09 DIAGNOSIS — R509 Fever, unspecified: Secondary | ICD-10-CM | POA: Diagnosis not present

## 2016-05-09 DIAGNOSIS — J0100 Acute maxillary sinusitis, unspecified: Secondary | ICD-10-CM | POA: Diagnosis not present

## 2016-05-09 DIAGNOSIS — J209 Acute bronchitis, unspecified: Secondary | ICD-10-CM | POA: Diagnosis not present

## 2016-05-20 DIAGNOSIS — E1140 Type 2 diabetes mellitus with diabetic neuropathy, unspecified: Secondary | ICD-10-CM | POA: Diagnosis not present

## 2016-05-20 DIAGNOSIS — K219 Gastro-esophageal reflux disease without esophagitis: Secondary | ICD-10-CM | POA: Diagnosis not present

## 2016-05-20 DIAGNOSIS — E782 Mixed hyperlipidemia: Secondary | ICD-10-CM | POA: Diagnosis not present

## 2016-05-20 DIAGNOSIS — F5101 Primary insomnia: Secondary | ICD-10-CM | POA: Diagnosis not present

## 2016-05-20 DIAGNOSIS — I10 Essential (primary) hypertension: Secondary | ICD-10-CM | POA: Diagnosis not present

## 2016-05-22 DIAGNOSIS — M75101 Unspecified rotator cuff tear or rupture of right shoulder, not specified as traumatic: Secondary | ICD-10-CM | POA: Diagnosis not present

## 2016-06-06 DIAGNOSIS — E119 Type 2 diabetes mellitus without complications: Secondary | ICD-10-CM | POA: Diagnosis not present

## 2016-06-06 DIAGNOSIS — H5203 Hypermetropia, bilateral: Secondary | ICD-10-CM | POA: Diagnosis not present

## 2016-06-18 DIAGNOSIS — L02411 Cutaneous abscess of right axilla: Secondary | ICD-10-CM | POA: Diagnosis not present

## 2016-06-21 DIAGNOSIS — L02411 Cutaneous abscess of right axilla: Secondary | ICD-10-CM | POA: Diagnosis not present

## 2016-06-24 DIAGNOSIS — R21 Rash and other nonspecific skin eruption: Secondary | ICD-10-CM | POA: Diagnosis not present

## 2016-06-24 DIAGNOSIS — L02411 Cutaneous abscess of right axilla: Secondary | ICD-10-CM | POA: Diagnosis not present

## 2016-06-28 DIAGNOSIS — L02411 Cutaneous abscess of right axilla: Secondary | ICD-10-CM | POA: Diagnosis not present

## 2016-07-08 DIAGNOSIS — M75101 Unspecified rotator cuff tear or rupture of right shoulder, not specified as traumatic: Secondary | ICD-10-CM | POA: Diagnosis not present

## 2016-08-08 DIAGNOSIS — M25511 Pain in right shoulder: Secondary | ICD-10-CM | POA: Diagnosis not present

## 2016-08-21 DIAGNOSIS — E1140 Type 2 diabetes mellitus with diabetic neuropathy, unspecified: Secondary | ICD-10-CM | POA: Diagnosis not present

## 2016-08-21 DIAGNOSIS — K219 Gastro-esophageal reflux disease without esophagitis: Secondary | ICD-10-CM | POA: Diagnosis not present

## 2016-08-21 DIAGNOSIS — E1142 Type 2 diabetes mellitus with diabetic polyneuropathy: Secondary | ICD-10-CM | POA: Diagnosis not present

## 2016-08-21 DIAGNOSIS — I10 Essential (primary) hypertension: Secondary | ICD-10-CM | POA: Diagnosis not present

## 2016-08-21 DIAGNOSIS — R0789 Other chest pain: Secondary | ICD-10-CM | POA: Diagnosis not present

## 2016-08-21 DIAGNOSIS — E782 Mixed hyperlipidemia: Secondary | ICD-10-CM | POA: Diagnosis not present

## 2016-10-15 DIAGNOSIS — M25511 Pain in right shoulder: Secondary | ICD-10-CM | POA: Diagnosis not present

## 2016-10-24 DIAGNOSIS — R400 Somnolence: Secondary | ICD-10-CM | POA: Diagnosis not present

## 2016-10-24 DIAGNOSIS — B070 Plantar wart: Secondary | ICD-10-CM | POA: Diagnosis not present

## 2016-10-24 DIAGNOSIS — R0683 Snoring: Secondary | ICD-10-CM | POA: Diagnosis not present

## 2016-11-08 DIAGNOSIS — L2089 Other atopic dermatitis: Secondary | ICD-10-CM | POA: Diagnosis not present

## 2016-11-08 DIAGNOSIS — R400 Somnolence: Secondary | ICD-10-CM | POA: Diagnosis not present

## 2016-11-08 DIAGNOSIS — B070 Plantar wart: Secondary | ICD-10-CM | POA: Diagnosis not present

## 2016-11-13 DIAGNOSIS — M25511 Pain in right shoulder: Secondary | ICD-10-CM | POA: Diagnosis not present

## 2016-11-17 HISTORY — PX: BACK SURGERY: SHX140

## 2016-11-20 DIAGNOSIS — M25511 Pain in right shoulder: Secondary | ICD-10-CM | POA: Diagnosis not present

## 2016-11-21 DIAGNOSIS — M25511 Pain in right shoulder: Secondary | ICD-10-CM | POA: Diagnosis not present

## 2016-11-21 DIAGNOSIS — M19011 Primary osteoarthritis, right shoulder: Secondary | ICD-10-CM | POA: Diagnosis not present

## 2016-11-21 DIAGNOSIS — M7531 Calcific tendinitis of right shoulder: Secondary | ICD-10-CM | POA: Diagnosis not present

## 2016-11-22 DIAGNOSIS — E782 Mixed hyperlipidemia: Secondary | ICD-10-CM | POA: Diagnosis not present

## 2016-11-22 DIAGNOSIS — E1140 Type 2 diabetes mellitus with diabetic neuropathy, unspecified: Secondary | ICD-10-CM | POA: Diagnosis not present

## 2016-11-22 DIAGNOSIS — K219 Gastro-esophageal reflux disease without esophagitis: Secondary | ICD-10-CM | POA: Diagnosis not present

## 2016-11-22 DIAGNOSIS — I10 Essential (primary) hypertension: Secondary | ICD-10-CM | POA: Diagnosis not present

## 2016-12-03 DIAGNOSIS — Z01818 Encounter for other preprocedural examination: Secondary | ICD-10-CM | POA: Diagnosis not present

## 2016-12-03 DIAGNOSIS — Z01810 Encounter for preprocedural cardiovascular examination: Secondary | ICD-10-CM | POA: Diagnosis not present

## 2016-12-03 DIAGNOSIS — M25511 Pain in right shoulder: Secondary | ICD-10-CM | POA: Diagnosis not present

## 2016-12-11 DIAGNOSIS — G4733 Obstructive sleep apnea (adult) (pediatric): Secondary | ICD-10-CM | POA: Diagnosis not present

## 2016-12-13 DIAGNOSIS — G8918 Other acute postprocedural pain: Secondary | ICD-10-CM | POA: Diagnosis not present

## 2016-12-13 DIAGNOSIS — E119 Type 2 diabetes mellitus without complications: Secondary | ICD-10-CM | POA: Diagnosis not present

## 2016-12-13 DIAGNOSIS — M7531 Calcific tendinitis of right shoulder: Secondary | ICD-10-CM | POA: Diagnosis not present

## 2016-12-13 DIAGNOSIS — J3089 Other allergic rhinitis: Secondary | ICD-10-CM | POA: Diagnosis not present

## 2016-12-13 DIAGNOSIS — Z79899 Other long term (current) drug therapy: Secondary | ICD-10-CM | POA: Diagnosis not present

## 2016-12-13 DIAGNOSIS — E7800 Pure hypercholesterolemia, unspecified: Secondary | ICD-10-CM | POA: Diagnosis not present

## 2016-12-13 DIAGNOSIS — M19011 Primary osteoarthritis, right shoulder: Secondary | ICD-10-CM | POA: Diagnosis not present

## 2016-12-13 DIAGNOSIS — Z7984 Long term (current) use of oral hypoglycemic drugs: Secondary | ICD-10-CM | POA: Diagnosis not present

## 2016-12-13 DIAGNOSIS — G8929 Other chronic pain: Secondary | ICD-10-CM | POA: Diagnosis not present

## 2016-12-13 DIAGNOSIS — I10 Essential (primary) hypertension: Secondary | ICD-10-CM | POA: Diagnosis not present

## 2016-12-13 DIAGNOSIS — K219 Gastro-esophageal reflux disease without esophagitis: Secondary | ICD-10-CM | POA: Diagnosis not present

## 2016-12-13 DIAGNOSIS — M75101 Unspecified rotator cuff tear or rupture of right shoulder, not specified as traumatic: Secondary | ICD-10-CM | POA: Diagnosis not present

## 2016-12-13 DIAGNOSIS — Z7982 Long term (current) use of aspirin: Secondary | ICD-10-CM | POA: Diagnosis not present

## 2016-12-16 DIAGNOSIS — M7531 Calcific tendinitis of right shoulder: Secondary | ICD-10-CM | POA: Diagnosis not present

## 2016-12-16 DIAGNOSIS — M6281 Muscle weakness (generalized): Secondary | ICD-10-CM | POA: Diagnosis not present

## 2016-12-16 DIAGNOSIS — M25511 Pain in right shoulder: Secondary | ICD-10-CM | POA: Diagnosis not present

## 2016-12-16 DIAGNOSIS — M19011 Primary osteoarthritis, right shoulder: Secondary | ICD-10-CM | POA: Diagnosis not present

## 2016-12-19 DIAGNOSIS — M6281 Muscle weakness (generalized): Secondary | ICD-10-CM | POA: Diagnosis not present

## 2016-12-19 DIAGNOSIS — M7531 Calcific tendinitis of right shoulder: Secondary | ICD-10-CM | POA: Diagnosis not present

## 2016-12-19 DIAGNOSIS — M19011 Primary osteoarthritis, right shoulder: Secondary | ICD-10-CM | POA: Diagnosis not present

## 2016-12-19 DIAGNOSIS — M25511 Pain in right shoulder: Secondary | ICD-10-CM | POA: Diagnosis not present

## 2016-12-24 DIAGNOSIS — M6281 Muscle weakness (generalized): Secondary | ICD-10-CM | POA: Diagnosis not present

## 2016-12-24 DIAGNOSIS — M19011 Primary osteoarthritis, right shoulder: Secondary | ICD-10-CM | POA: Diagnosis not present

## 2016-12-24 DIAGNOSIS — M7531 Calcific tendinitis of right shoulder: Secondary | ICD-10-CM | POA: Diagnosis not present

## 2016-12-24 DIAGNOSIS — M25511 Pain in right shoulder: Secondary | ICD-10-CM | POA: Diagnosis not present

## 2016-12-26 DIAGNOSIS — M6281 Muscle weakness (generalized): Secondary | ICD-10-CM | POA: Diagnosis not present

## 2016-12-26 DIAGNOSIS — M25511 Pain in right shoulder: Secondary | ICD-10-CM | POA: Diagnosis not present

## 2016-12-26 DIAGNOSIS — M7531 Calcific tendinitis of right shoulder: Secondary | ICD-10-CM | POA: Diagnosis not present

## 2016-12-26 DIAGNOSIS — M19011 Primary osteoarthritis, right shoulder: Secondary | ICD-10-CM | POA: Diagnosis not present

## 2016-12-27 DIAGNOSIS — M19011 Primary osteoarthritis, right shoulder: Secondary | ICD-10-CM | POA: Diagnosis not present

## 2016-12-27 DIAGNOSIS — M25511 Pain in right shoulder: Secondary | ICD-10-CM | POA: Diagnosis not present

## 2016-12-27 DIAGNOSIS — M6281 Muscle weakness (generalized): Secondary | ICD-10-CM | POA: Diagnosis not present

## 2016-12-27 DIAGNOSIS — M7531 Calcific tendinitis of right shoulder: Secondary | ICD-10-CM | POA: Diagnosis not present

## 2016-12-31 DIAGNOSIS — M6281 Muscle weakness (generalized): Secondary | ICD-10-CM | POA: Diagnosis not present

## 2016-12-31 DIAGNOSIS — M7531 Calcific tendinitis of right shoulder: Secondary | ICD-10-CM | POA: Diagnosis not present

## 2016-12-31 DIAGNOSIS — G4733 Obstructive sleep apnea (adult) (pediatric): Secondary | ICD-10-CM | POA: Diagnosis not present

## 2016-12-31 DIAGNOSIS — M19011 Primary osteoarthritis, right shoulder: Secondary | ICD-10-CM | POA: Diagnosis not present

## 2016-12-31 DIAGNOSIS — M25511 Pain in right shoulder: Secondary | ICD-10-CM | POA: Diagnosis not present

## 2017-01-02 DIAGNOSIS — M25511 Pain in right shoulder: Secondary | ICD-10-CM | POA: Diagnosis not present

## 2017-01-02 DIAGNOSIS — M7531 Calcific tendinitis of right shoulder: Secondary | ICD-10-CM | POA: Diagnosis not present

## 2017-01-02 DIAGNOSIS — M6281 Muscle weakness (generalized): Secondary | ICD-10-CM | POA: Diagnosis not present

## 2017-01-02 DIAGNOSIS — M19011 Primary osteoarthritis, right shoulder: Secondary | ICD-10-CM | POA: Diagnosis not present

## 2017-01-06 DIAGNOSIS — M25511 Pain in right shoulder: Secondary | ICD-10-CM | POA: Diagnosis not present

## 2017-01-06 DIAGNOSIS — M6281 Muscle weakness (generalized): Secondary | ICD-10-CM | POA: Diagnosis not present

## 2017-01-06 DIAGNOSIS — M19011 Primary osteoarthritis, right shoulder: Secondary | ICD-10-CM | POA: Diagnosis not present

## 2017-01-06 DIAGNOSIS — M7531 Calcific tendinitis of right shoulder: Secondary | ICD-10-CM | POA: Diagnosis not present

## 2017-01-10 DIAGNOSIS — M19011 Primary osteoarthritis, right shoulder: Secondary | ICD-10-CM | POA: Diagnosis not present

## 2017-01-10 DIAGNOSIS — M25511 Pain in right shoulder: Secondary | ICD-10-CM | POA: Diagnosis not present

## 2017-01-10 DIAGNOSIS — M6281 Muscle weakness (generalized): Secondary | ICD-10-CM | POA: Diagnosis not present

## 2017-01-10 DIAGNOSIS — M7531 Calcific tendinitis of right shoulder: Secondary | ICD-10-CM | POA: Diagnosis not present

## 2017-01-14 DIAGNOSIS — M7531 Calcific tendinitis of right shoulder: Secondary | ICD-10-CM | POA: Diagnosis not present

## 2017-01-14 DIAGNOSIS — M6281 Muscle weakness (generalized): Secondary | ICD-10-CM | POA: Diagnosis not present

## 2017-01-14 DIAGNOSIS — M19011 Primary osteoarthritis, right shoulder: Secondary | ICD-10-CM | POA: Diagnosis not present

## 2017-01-14 DIAGNOSIS — M25511 Pain in right shoulder: Secondary | ICD-10-CM | POA: Diagnosis not present

## 2017-01-16 DIAGNOSIS — M19011 Primary osteoarthritis, right shoulder: Secondary | ICD-10-CM | POA: Diagnosis not present

## 2017-01-16 DIAGNOSIS — M25511 Pain in right shoulder: Secondary | ICD-10-CM | POA: Diagnosis not present

## 2017-01-16 DIAGNOSIS — M6281 Muscle weakness (generalized): Secondary | ICD-10-CM | POA: Diagnosis not present

## 2017-01-16 DIAGNOSIS — M7531 Calcific tendinitis of right shoulder: Secondary | ICD-10-CM | POA: Diagnosis not present

## 2017-01-21 DIAGNOSIS — M7531 Calcific tendinitis of right shoulder: Secondary | ICD-10-CM | POA: Diagnosis not present

## 2017-01-21 DIAGNOSIS — M6281 Muscle weakness (generalized): Secondary | ICD-10-CM | POA: Diagnosis not present

## 2017-01-21 DIAGNOSIS — M25511 Pain in right shoulder: Secondary | ICD-10-CM | POA: Diagnosis not present

## 2017-01-21 DIAGNOSIS — M19011 Primary osteoarthritis, right shoulder: Secondary | ICD-10-CM | POA: Diagnosis not present

## 2017-01-24 DIAGNOSIS — M19011 Primary osteoarthritis, right shoulder: Secondary | ICD-10-CM | POA: Diagnosis not present

## 2017-01-24 DIAGNOSIS — M6281 Muscle weakness (generalized): Secondary | ICD-10-CM | POA: Diagnosis not present

## 2017-01-24 DIAGNOSIS — M7531 Calcific tendinitis of right shoulder: Secondary | ICD-10-CM | POA: Diagnosis not present

## 2017-01-24 DIAGNOSIS — M25511 Pain in right shoulder: Secondary | ICD-10-CM | POA: Diagnosis not present

## 2017-03-03 DIAGNOSIS — M6281 Muscle weakness (generalized): Secondary | ICD-10-CM | POA: Diagnosis not present

## 2017-03-03 DIAGNOSIS — M19011 Primary osteoarthritis, right shoulder: Secondary | ICD-10-CM | POA: Diagnosis not present

## 2017-03-03 DIAGNOSIS — M7531 Calcific tendinitis of right shoulder: Secondary | ICD-10-CM | POA: Diagnosis not present

## 2017-03-03 DIAGNOSIS — M25511 Pain in right shoulder: Secondary | ICD-10-CM | POA: Diagnosis not present

## 2017-03-04 DIAGNOSIS — G4733 Obstructive sleep apnea (adult) (pediatric): Secondary | ICD-10-CM | POA: Diagnosis not present

## 2017-03-05 DIAGNOSIS — M25511 Pain in right shoulder: Secondary | ICD-10-CM | POA: Diagnosis not present

## 2017-03-05 DIAGNOSIS — M25611 Stiffness of right shoulder, not elsewhere classified: Secondary | ICD-10-CM | POA: Diagnosis not present

## 2017-03-07 DIAGNOSIS — M6281 Muscle weakness (generalized): Secondary | ICD-10-CM | POA: Diagnosis not present

## 2017-03-07 DIAGNOSIS — M19011 Primary osteoarthritis, right shoulder: Secondary | ICD-10-CM | POA: Diagnosis not present

## 2017-03-07 DIAGNOSIS — M25511 Pain in right shoulder: Secondary | ICD-10-CM | POA: Diagnosis not present

## 2017-03-07 DIAGNOSIS — M7531 Calcific tendinitis of right shoulder: Secondary | ICD-10-CM | POA: Diagnosis not present

## 2017-03-12 DIAGNOSIS — M25511 Pain in right shoulder: Secondary | ICD-10-CM | POA: Diagnosis not present

## 2017-03-12 DIAGNOSIS — M7531 Calcific tendinitis of right shoulder: Secondary | ICD-10-CM | POA: Diagnosis not present

## 2017-03-12 DIAGNOSIS — M19011 Primary osteoarthritis, right shoulder: Secondary | ICD-10-CM | POA: Diagnosis not present

## 2017-03-12 DIAGNOSIS — M6281 Muscle weakness (generalized): Secondary | ICD-10-CM | POA: Diagnosis not present

## 2017-03-13 DIAGNOSIS — Z13820 Encounter for screening for osteoporosis: Secondary | ICD-10-CM | POA: Diagnosis not present

## 2017-03-13 DIAGNOSIS — Z0001 Encounter for general adult medical examination with abnormal findings: Secondary | ICD-10-CM | POA: Diagnosis not present

## 2017-03-13 DIAGNOSIS — R012 Other cardiac sounds: Secondary | ICD-10-CM | POA: Diagnosis not present

## 2017-03-13 DIAGNOSIS — Z1231 Encounter for screening mammogram for malignant neoplasm of breast: Secondary | ICD-10-CM | POA: Diagnosis not present

## 2017-03-14 DIAGNOSIS — M19011 Primary osteoarthritis, right shoulder: Secondary | ICD-10-CM | POA: Diagnosis not present

## 2017-03-14 DIAGNOSIS — M6281 Muscle weakness (generalized): Secondary | ICD-10-CM | POA: Diagnosis not present

## 2017-03-14 DIAGNOSIS — M25511 Pain in right shoulder: Secondary | ICD-10-CM | POA: Diagnosis not present

## 2017-03-14 DIAGNOSIS — M7531 Calcific tendinitis of right shoulder: Secondary | ICD-10-CM | POA: Diagnosis not present

## 2017-03-19 DIAGNOSIS — M19011 Primary osteoarthritis, right shoulder: Secondary | ICD-10-CM | POA: Diagnosis not present

## 2017-03-19 DIAGNOSIS — M7531 Calcific tendinitis of right shoulder: Secondary | ICD-10-CM | POA: Diagnosis not present

## 2017-03-19 DIAGNOSIS — M25511 Pain in right shoulder: Secondary | ICD-10-CM | POA: Diagnosis not present

## 2017-03-19 DIAGNOSIS — M6281 Muscle weakness (generalized): Secondary | ICD-10-CM | POA: Diagnosis not present

## 2017-03-21 DIAGNOSIS — R011 Cardiac murmur, unspecified: Secondary | ICD-10-CM | POA: Diagnosis not present

## 2017-03-25 DIAGNOSIS — E782 Mixed hyperlipidemia: Secondary | ICD-10-CM | POA: Diagnosis not present

## 2017-03-25 DIAGNOSIS — E1140 Type 2 diabetes mellitus with diabetic neuropathy, unspecified: Secondary | ICD-10-CM | POA: Diagnosis not present

## 2017-03-25 DIAGNOSIS — I10 Essential (primary) hypertension: Secondary | ICD-10-CM | POA: Diagnosis not present

## 2017-03-25 DIAGNOSIS — K219 Gastro-esophageal reflux disease without esophagitis: Secondary | ICD-10-CM | POA: Diagnosis not present

## 2017-03-26 DIAGNOSIS — G4733 Obstructive sleep apnea (adult) (pediatric): Secondary | ICD-10-CM | POA: Diagnosis not present

## 2017-03-26 DIAGNOSIS — M19011 Primary osteoarthritis, right shoulder: Secondary | ICD-10-CM | POA: Diagnosis not present

## 2017-03-26 DIAGNOSIS — M6281 Muscle weakness (generalized): Secondary | ICD-10-CM | POA: Diagnosis not present

## 2017-03-26 DIAGNOSIS — M25511 Pain in right shoulder: Secondary | ICD-10-CM | POA: Diagnosis not present

## 2017-03-26 DIAGNOSIS — M7531 Calcific tendinitis of right shoulder: Secondary | ICD-10-CM | POA: Diagnosis not present

## 2017-03-28 DIAGNOSIS — M19011 Primary osteoarthritis, right shoulder: Secondary | ICD-10-CM | POA: Diagnosis not present

## 2017-03-28 DIAGNOSIS — M7531 Calcific tendinitis of right shoulder: Secondary | ICD-10-CM | POA: Diagnosis not present

## 2017-03-28 DIAGNOSIS — M6281 Muscle weakness (generalized): Secondary | ICD-10-CM | POA: Diagnosis not present

## 2017-03-28 DIAGNOSIS — M25511 Pain in right shoulder: Secondary | ICD-10-CM | POA: Diagnosis not present

## 2017-04-01 DIAGNOSIS — M19011 Primary osteoarthritis, right shoulder: Secondary | ICD-10-CM | POA: Diagnosis not present

## 2017-04-01 DIAGNOSIS — M6281 Muscle weakness (generalized): Secondary | ICD-10-CM | POA: Diagnosis not present

## 2017-04-01 DIAGNOSIS — M7531 Calcific tendinitis of right shoulder: Secondary | ICD-10-CM | POA: Diagnosis not present

## 2017-04-01 DIAGNOSIS — M25511 Pain in right shoulder: Secondary | ICD-10-CM | POA: Diagnosis not present

## 2017-04-02 DIAGNOSIS — J060 Acute laryngopharyngitis: Secondary | ICD-10-CM | POA: Diagnosis not present

## 2017-04-03 DIAGNOSIS — G4733 Obstructive sleep apnea (adult) (pediatric): Secondary | ICD-10-CM | POA: Diagnosis not present

## 2017-04-11 DIAGNOSIS — N958 Other specified menopausal and perimenopausal disorders: Secondary | ICD-10-CM | POA: Diagnosis not present

## 2017-04-11 DIAGNOSIS — M85852 Other specified disorders of bone density and structure, left thigh: Secondary | ICD-10-CM | POA: Diagnosis not present

## 2017-04-11 DIAGNOSIS — Z1231 Encounter for screening mammogram for malignant neoplasm of breast: Secondary | ICD-10-CM | POA: Diagnosis not present

## 2017-04-16 DIAGNOSIS — J060 Acute laryngopharyngitis: Secondary | ICD-10-CM | POA: Diagnosis not present

## 2017-04-21 DIAGNOSIS — M25511 Pain in right shoulder: Secondary | ICD-10-CM | POA: Diagnosis not present

## 2017-04-21 DIAGNOSIS — M7531 Calcific tendinitis of right shoulder: Secondary | ICD-10-CM | POA: Diagnosis not present

## 2017-04-21 DIAGNOSIS — M25611 Stiffness of right shoulder, not elsewhere classified: Secondary | ICD-10-CM | POA: Diagnosis not present

## 2017-05-04 DIAGNOSIS — G4733 Obstructive sleep apnea (adult) (pediatric): Secondary | ICD-10-CM | POA: Diagnosis not present

## 2017-05-08 DIAGNOSIS — L578 Other skin changes due to chronic exposure to nonionizing radiation: Secondary | ICD-10-CM | POA: Diagnosis not present

## 2017-05-08 DIAGNOSIS — D485 Neoplasm of uncertain behavior of skin: Secondary | ICD-10-CM | POA: Diagnosis not present

## 2017-05-08 DIAGNOSIS — L570 Actinic keratosis: Secondary | ICD-10-CM | POA: Diagnosis not present

## 2017-05-22 DIAGNOSIS — I10 Essential (primary) hypertension: Secondary | ICD-10-CM | POA: Diagnosis not present

## 2017-06-04 DIAGNOSIS — G4733 Obstructive sleep apnea (adult) (pediatric): Secondary | ICD-10-CM | POA: Diagnosis not present

## 2017-06-05 DIAGNOSIS — I10 Essential (primary) hypertension: Secondary | ICD-10-CM | POA: Diagnosis not present

## 2017-06-05 DIAGNOSIS — R600 Localized edema: Secondary | ICD-10-CM | POA: Diagnosis not present

## 2017-06-18 DIAGNOSIS — R82998 Other abnormal findings in urine: Secondary | ICD-10-CM | POA: Diagnosis not present

## 2017-06-18 DIAGNOSIS — M545 Low back pain: Secondary | ICD-10-CM | POA: Diagnosis not present

## 2017-06-19 DIAGNOSIS — M545 Low back pain: Secondary | ICD-10-CM | POA: Diagnosis not present

## 2017-06-19 DIAGNOSIS — M5136 Other intervertebral disc degeneration, lumbar region: Secondary | ICD-10-CM | POA: Diagnosis not present

## 2017-06-23 DIAGNOSIS — M4726 Other spondylosis with radiculopathy, lumbar region: Secondary | ICD-10-CM | POA: Diagnosis not present

## 2017-06-23 DIAGNOSIS — M5432 Sciatica, left side: Secondary | ICD-10-CM | POA: Diagnosis not present

## 2017-06-23 DIAGNOSIS — M5116 Intervertebral disc disorders with radiculopathy, lumbar region: Secondary | ICD-10-CM | POA: Diagnosis not present

## 2017-06-24 DIAGNOSIS — G4733 Obstructive sleep apnea (adult) (pediatric): Secondary | ICD-10-CM | POA: Diagnosis not present

## 2017-06-27 DIAGNOSIS — E782 Mixed hyperlipidemia: Secondary | ICD-10-CM | POA: Diagnosis not present

## 2017-06-27 DIAGNOSIS — I10 Essential (primary) hypertension: Secondary | ICD-10-CM | POA: Diagnosis not present

## 2017-06-27 DIAGNOSIS — K219 Gastro-esophageal reflux disease without esophagitis: Secondary | ICD-10-CM | POA: Diagnosis not present

## 2017-06-27 DIAGNOSIS — E1140 Type 2 diabetes mellitus with diabetic neuropathy, unspecified: Secondary | ICD-10-CM | POA: Diagnosis not present

## 2017-06-30 DIAGNOSIS — M545 Low back pain: Secondary | ICD-10-CM | POA: Diagnosis not present

## 2017-06-30 DIAGNOSIS — M4726 Other spondylosis with radiculopathy, lumbar region: Secondary | ICD-10-CM | POA: Diagnosis not present

## 2017-07-02 DIAGNOSIS — G4733 Obstructive sleep apnea (adult) (pediatric): Secondary | ICD-10-CM | POA: Diagnosis not present

## 2017-07-03 DIAGNOSIS — M5116 Intervertebral disc disorders with radiculopathy, lumbar region: Secondary | ICD-10-CM | POA: Diagnosis not present

## 2017-07-03 DIAGNOSIS — M5432 Sciatica, left side: Secondary | ICD-10-CM | POA: Diagnosis not present

## 2017-07-03 DIAGNOSIS — M4726 Other spondylosis with radiculopathy, lumbar region: Secondary | ICD-10-CM | POA: Diagnosis not present

## 2017-07-10 DIAGNOSIS — J018 Other acute sinusitis: Secondary | ICD-10-CM | POA: Diagnosis not present

## 2017-07-17 DIAGNOSIS — I10 Essential (primary) hypertension: Secondary | ICD-10-CM | POA: Diagnosis not present

## 2017-07-17 DIAGNOSIS — M5416 Radiculopathy, lumbar region: Secondary | ICD-10-CM | POA: Diagnosis not present

## 2017-07-22 DIAGNOSIS — M5416 Radiculopathy, lumbar region: Secondary | ICD-10-CM | POA: Diagnosis not present

## 2017-08-02 DIAGNOSIS — G4733 Obstructive sleep apnea (adult) (pediatric): Secondary | ICD-10-CM | POA: Diagnosis not present

## 2017-08-12 DIAGNOSIS — M5416 Radiculopathy, lumbar region: Secondary | ICD-10-CM | POA: Diagnosis not present

## 2017-08-28 DIAGNOSIS — M7918 Myalgia, other site: Secondary | ICD-10-CM | POA: Diagnosis not present

## 2017-08-28 DIAGNOSIS — M5416 Radiculopathy, lumbar region: Secondary | ICD-10-CM | POA: Diagnosis not present

## 2017-09-01 DIAGNOSIS — G4733 Obstructive sleep apnea (adult) (pediatric): Secondary | ICD-10-CM | POA: Diagnosis not present

## 2017-09-13 DIAGNOSIS — A932 Colorado tick fever: Secondary | ICD-10-CM | POA: Diagnosis not present

## 2017-09-17 DIAGNOSIS — M5416 Radiculopathy, lumbar region: Secondary | ICD-10-CM | POA: Diagnosis not present

## 2017-09-17 DIAGNOSIS — G894 Chronic pain syndrome: Secondary | ICD-10-CM | POA: Diagnosis not present

## 2017-09-17 DIAGNOSIS — Z79899 Other long term (current) drug therapy: Secondary | ICD-10-CM | POA: Diagnosis not present

## 2017-09-17 DIAGNOSIS — Z79891 Long term (current) use of opiate analgesic: Secondary | ICD-10-CM | POA: Diagnosis not present

## 2017-09-29 DIAGNOSIS — K219 Gastro-esophageal reflux disease without esophagitis: Secondary | ICD-10-CM | POA: Diagnosis not present

## 2017-09-29 DIAGNOSIS — E1140 Type 2 diabetes mellitus with diabetic neuropathy, unspecified: Secondary | ICD-10-CM | POA: Diagnosis not present

## 2017-09-29 DIAGNOSIS — Z0184 Encounter for antibody response examination: Secondary | ICD-10-CM | POA: Diagnosis not present

## 2017-09-29 DIAGNOSIS — E782 Mixed hyperlipidemia: Secondary | ICD-10-CM | POA: Diagnosis not present

## 2017-09-29 DIAGNOSIS — I10 Essential (primary) hypertension: Secondary | ICD-10-CM | POA: Diagnosis not present

## 2017-10-02 DIAGNOSIS — G4733 Obstructive sleep apnea (adult) (pediatric): Secondary | ICD-10-CM | POA: Diagnosis not present

## 2017-10-09 DIAGNOSIS — M5416 Radiculopathy, lumbar region: Secondary | ICD-10-CM | POA: Diagnosis not present

## 2017-10-09 DIAGNOSIS — M5116 Intervertebral disc disorders with radiculopathy, lumbar region: Secondary | ICD-10-CM | POA: Diagnosis not present

## 2017-10-09 DIAGNOSIS — M4726 Other spondylosis with radiculopathy, lumbar region: Secondary | ICD-10-CM | POA: Diagnosis not present

## 2017-10-28 DIAGNOSIS — H52223 Regular astigmatism, bilateral: Secondary | ICD-10-CM | POA: Diagnosis not present

## 2017-10-28 DIAGNOSIS — E119 Type 2 diabetes mellitus without complications: Secondary | ICD-10-CM | POA: Diagnosis not present

## 2017-11-01 DIAGNOSIS — G4733 Obstructive sleep apnea (adult) (pediatric): Secondary | ICD-10-CM | POA: Diagnosis not present

## 2017-11-06 DIAGNOSIS — Z01818 Encounter for other preprocedural examination: Secondary | ICD-10-CM | POA: Diagnosis not present

## 2017-11-06 DIAGNOSIS — R233 Spontaneous ecchymoses: Secondary | ICD-10-CM | POA: Diagnosis not present

## 2017-11-06 DIAGNOSIS — Z01810 Encounter for preprocedural cardiovascular examination: Secondary | ICD-10-CM | POA: Diagnosis not present

## 2017-11-06 DIAGNOSIS — E1142 Type 2 diabetes mellitus with diabetic polyneuropathy: Secondary | ICD-10-CM | POA: Diagnosis not present

## 2017-11-06 DIAGNOSIS — E782 Mixed hyperlipidemia: Secondary | ICD-10-CM | POA: Diagnosis not present

## 2017-11-06 DIAGNOSIS — I10 Essential (primary) hypertension: Secondary | ICD-10-CM | POA: Diagnosis not present

## 2017-11-07 DIAGNOSIS — M4726 Other spondylosis with radiculopathy, lumbar region: Secondary | ICD-10-CM | POA: Diagnosis not present

## 2017-11-07 DIAGNOSIS — M5116 Intervertebral disc disorders with radiculopathy, lumbar region: Secondary | ICD-10-CM | POA: Diagnosis not present

## 2017-11-07 DIAGNOSIS — Z4689 Encounter for fitting and adjustment of other specified devices: Secondary | ICD-10-CM | POA: Diagnosis not present

## 2017-11-07 DIAGNOSIS — M48062 Spinal stenosis, lumbar region with neurogenic claudication: Secondary | ICD-10-CM | POA: Diagnosis not present

## 2017-11-11 DIAGNOSIS — M5117 Intervertebral disc disorders with radiculopathy, lumbosacral region: Secondary | ICD-10-CM | POA: Diagnosis not present

## 2017-11-11 DIAGNOSIS — Z01818 Encounter for other preprocedural examination: Secondary | ICD-10-CM | POA: Diagnosis not present

## 2017-11-11 DIAGNOSIS — G4733 Obstructive sleep apnea (adult) (pediatric): Secondary | ICD-10-CM | POA: Diagnosis not present

## 2017-11-17 DIAGNOSIS — M6281 Muscle weakness (generalized): Secondary | ICD-10-CM | POA: Diagnosis not present

## 2017-11-17 DIAGNOSIS — M545 Low back pain: Secondary | ICD-10-CM | POA: Diagnosis not present

## 2017-11-17 DIAGNOSIS — Z7984 Long term (current) use of oral hypoglycemic drugs: Secondary | ICD-10-CM | POA: Diagnosis not present

## 2017-11-17 DIAGNOSIS — M48062 Spinal stenosis, lumbar region with neurogenic claudication: Secondary | ICD-10-CM | POA: Diagnosis not present

## 2017-11-17 DIAGNOSIS — R5383 Other fatigue: Secondary | ICD-10-CM | POA: Diagnosis not present

## 2017-11-17 DIAGNOSIS — G4730 Sleep apnea, unspecified: Secondary | ICD-10-CM | POA: Diagnosis not present

## 2017-11-17 DIAGNOSIS — I10 Essential (primary) hypertension: Secondary | ICD-10-CM | POA: Diagnosis not present

## 2017-11-17 DIAGNOSIS — M2550 Pain in unspecified joint: Secondary | ICD-10-CM | POA: Diagnosis not present

## 2017-11-17 DIAGNOSIS — K219 Gastro-esophageal reflux disease without esophagitis: Secondary | ICD-10-CM | POA: Diagnosis not present

## 2017-11-17 DIAGNOSIS — M532X6 Spinal instabilities, lumbar region: Secondary | ICD-10-CM | POA: Diagnosis not present

## 2017-11-17 DIAGNOSIS — R278 Other lack of coordination: Secondary | ICD-10-CM | POA: Diagnosis not present

## 2017-11-17 DIAGNOSIS — M549 Dorsalgia, unspecified: Secondary | ICD-10-CM | POA: Diagnosis not present

## 2017-11-17 DIAGNOSIS — M5116 Intervertebral disc disorders with radiculopathy, lumbar region: Secondary | ICD-10-CM | POA: Diagnosis not present

## 2017-11-17 DIAGNOSIS — M4327 Fusion of spine, lumbosacral region: Secondary | ICD-10-CM | POA: Diagnosis not present

## 2017-11-17 DIAGNOSIS — R2689 Other abnormalities of gait and mobility: Secondary | ICD-10-CM | POA: Diagnosis not present

## 2017-11-17 DIAGNOSIS — M47896 Other spondylosis, lumbar region: Secondary | ICD-10-CM | POA: Diagnosis not present

## 2017-11-17 DIAGNOSIS — M48061 Spinal stenosis, lumbar region without neurogenic claudication: Secondary | ICD-10-CM | POA: Diagnosis not present

## 2017-11-17 DIAGNOSIS — Z79899 Other long term (current) drug therapy: Secondary | ICD-10-CM | POA: Diagnosis not present

## 2017-11-17 DIAGNOSIS — Z7401 Bed confinement status: Secondary | ICD-10-CM | POA: Diagnosis not present

## 2017-11-17 DIAGNOSIS — M4726 Other spondylosis with radiculopathy, lumbar region: Secondary | ICD-10-CM | POA: Diagnosis not present

## 2017-11-17 DIAGNOSIS — E119 Type 2 diabetes mellitus without complications: Secondary | ICD-10-CM | POA: Diagnosis not present

## 2017-11-17 DIAGNOSIS — Z48811 Encounter for surgical aftercare following surgery on the nervous system: Secondary | ICD-10-CM | POA: Diagnosis not present

## 2017-11-17 DIAGNOSIS — M5136 Other intervertebral disc degeneration, lumbar region: Secondary | ICD-10-CM | POA: Diagnosis not present

## 2017-11-19 DIAGNOSIS — M47896 Other spondylosis, lumbar region: Secondary | ICD-10-CM | POA: Diagnosis not present

## 2017-11-19 DIAGNOSIS — R278 Other lack of coordination: Secondary | ICD-10-CM | POA: Diagnosis not present

## 2017-11-19 DIAGNOSIS — M2550 Pain in unspecified joint: Secondary | ICD-10-CM | POA: Diagnosis not present

## 2017-11-19 DIAGNOSIS — R5383 Other fatigue: Secondary | ICD-10-CM | POA: Diagnosis not present

## 2017-11-19 DIAGNOSIS — M6281 Muscle weakness (generalized): Secondary | ICD-10-CM | POA: Diagnosis not present

## 2017-11-19 DIAGNOSIS — Z48811 Encounter for surgical aftercare following surgery on the nervous system: Secondary | ICD-10-CM | POA: Diagnosis not present

## 2017-11-19 DIAGNOSIS — Z7401 Bed confinement status: Secondary | ICD-10-CM | POA: Diagnosis not present

## 2017-11-19 DIAGNOSIS — I10 Essential (primary) hypertension: Secondary | ICD-10-CM | POA: Diagnosis not present

## 2017-11-19 DIAGNOSIS — E1159 Type 2 diabetes mellitus with other circulatory complications: Secondary | ICD-10-CM | POA: Diagnosis not present

## 2017-11-19 DIAGNOSIS — R2689 Other abnormalities of gait and mobility: Secondary | ICD-10-CM | POA: Diagnosis not present

## 2017-11-19 DIAGNOSIS — Z981 Arthrodesis status: Secondary | ICD-10-CM | POA: Diagnosis not present

## 2017-11-19 DIAGNOSIS — M549 Dorsalgia, unspecified: Secondary | ICD-10-CM | POA: Diagnosis not present

## 2017-11-19 DIAGNOSIS — R531 Weakness: Secondary | ICD-10-CM | POA: Diagnosis not present

## 2017-11-19 DIAGNOSIS — M5136 Other intervertebral disc degeneration, lumbar region: Secondary | ICD-10-CM | POA: Diagnosis not present

## 2017-11-19 DIAGNOSIS — M545 Low back pain: Secondary | ICD-10-CM | POA: Diagnosis not present

## 2017-11-19 DIAGNOSIS — M48061 Spinal stenosis, lumbar region without neurogenic claudication: Secondary | ICD-10-CM | POA: Diagnosis not present

## 2017-11-19 DIAGNOSIS — E119 Type 2 diabetes mellitus without complications: Secondary | ICD-10-CM | POA: Diagnosis not present

## 2017-11-21 DIAGNOSIS — I10 Essential (primary) hypertension: Secondary | ICD-10-CM | POA: Diagnosis not present

## 2017-11-21 DIAGNOSIS — R531 Weakness: Secondary | ICD-10-CM | POA: Diagnosis not present

## 2017-11-21 DIAGNOSIS — E1159 Type 2 diabetes mellitus with other circulatory complications: Secondary | ICD-10-CM | POA: Diagnosis not present

## 2017-11-21 DIAGNOSIS — Z981 Arthrodesis status: Secondary | ICD-10-CM | POA: Diagnosis not present

## 2017-11-28 DIAGNOSIS — Z7984 Long term (current) use of oral hypoglycemic drugs: Secondary | ICD-10-CM | POA: Diagnosis not present

## 2017-11-28 DIAGNOSIS — E559 Vitamin D deficiency, unspecified: Secondary | ICD-10-CM | POA: Diagnosis not present

## 2017-11-28 DIAGNOSIS — Z7982 Long term (current) use of aspirin: Secondary | ICD-10-CM | POA: Diagnosis not present

## 2017-11-28 DIAGNOSIS — G4733 Obstructive sleep apnea (adult) (pediatric): Secondary | ICD-10-CM | POA: Diagnosis not present

## 2017-11-28 DIAGNOSIS — E1159 Type 2 diabetes mellitus with other circulatory complications: Secondary | ICD-10-CM | POA: Diagnosis not present

## 2017-11-28 DIAGNOSIS — M150 Primary generalized (osteo)arthritis: Secondary | ICD-10-CM | POA: Diagnosis not present

## 2017-11-28 DIAGNOSIS — Z79891 Long term (current) use of opiate analgesic: Secondary | ICD-10-CM | POA: Diagnosis not present

## 2017-11-28 DIAGNOSIS — M47892 Other spondylosis, cervical region: Secondary | ICD-10-CM | POA: Diagnosis not present

## 2017-11-28 DIAGNOSIS — Z9181 History of falling: Secondary | ICD-10-CM | POA: Diagnosis not present

## 2017-11-28 DIAGNOSIS — Z981 Arthrodesis status: Secondary | ICD-10-CM | POA: Diagnosis not present

## 2017-11-28 DIAGNOSIS — I10 Essential (primary) hypertension: Secondary | ICD-10-CM | POA: Diagnosis not present

## 2017-11-28 DIAGNOSIS — Z4789 Encounter for other orthopedic aftercare: Secondary | ICD-10-CM | POA: Diagnosis not present

## 2017-12-01 DIAGNOSIS — K5903 Drug induced constipation: Secondary | ICD-10-CM | POA: Diagnosis not present

## 2017-12-01 DIAGNOSIS — K648 Other hemorrhoids: Secondary | ICD-10-CM | POA: Diagnosis not present

## 2017-12-02 DIAGNOSIS — Z79891 Long term (current) use of opiate analgesic: Secondary | ICD-10-CM | POA: Diagnosis not present

## 2017-12-02 DIAGNOSIS — Z7982 Long term (current) use of aspirin: Secondary | ICD-10-CM | POA: Diagnosis not present

## 2017-12-02 DIAGNOSIS — I10 Essential (primary) hypertension: Secondary | ICD-10-CM | POA: Diagnosis not present

## 2017-12-02 DIAGNOSIS — Z9181 History of falling: Secondary | ICD-10-CM | POA: Diagnosis not present

## 2017-12-02 DIAGNOSIS — Z7984 Long term (current) use of oral hypoglycemic drugs: Secondary | ICD-10-CM | POA: Diagnosis not present

## 2017-12-02 DIAGNOSIS — Z4789 Encounter for other orthopedic aftercare: Secondary | ICD-10-CM | POA: Diagnosis not present

## 2017-12-02 DIAGNOSIS — M47892 Other spondylosis, cervical region: Secondary | ICD-10-CM | POA: Diagnosis not present

## 2017-12-02 DIAGNOSIS — E559 Vitamin D deficiency, unspecified: Secondary | ICD-10-CM | POA: Diagnosis not present

## 2017-12-02 DIAGNOSIS — M150 Primary generalized (osteo)arthritis: Secondary | ICD-10-CM | POA: Diagnosis not present

## 2017-12-02 DIAGNOSIS — Z981 Arthrodesis status: Secondary | ICD-10-CM | POA: Diagnosis not present

## 2017-12-02 DIAGNOSIS — G4733 Obstructive sleep apnea (adult) (pediatric): Secondary | ICD-10-CM | POA: Diagnosis not present

## 2017-12-02 DIAGNOSIS — E1159 Type 2 diabetes mellitus with other circulatory complications: Secondary | ICD-10-CM | POA: Diagnosis not present

## 2017-12-08 DIAGNOSIS — E559 Vitamin D deficiency, unspecified: Secondary | ICD-10-CM | POA: Diagnosis not present

## 2017-12-08 DIAGNOSIS — E1159 Type 2 diabetes mellitus with other circulatory complications: Secondary | ICD-10-CM | POA: Diagnosis not present

## 2017-12-08 DIAGNOSIS — M150 Primary generalized (osteo)arthritis: Secondary | ICD-10-CM | POA: Diagnosis not present

## 2017-12-08 DIAGNOSIS — Z981 Arthrodesis status: Secondary | ICD-10-CM | POA: Diagnosis not present

## 2017-12-08 DIAGNOSIS — Z79891 Long term (current) use of opiate analgesic: Secondary | ICD-10-CM | POA: Diagnosis not present

## 2017-12-08 DIAGNOSIS — Z7984 Long term (current) use of oral hypoglycemic drugs: Secondary | ICD-10-CM | POA: Diagnosis not present

## 2017-12-08 DIAGNOSIS — Z9181 History of falling: Secondary | ICD-10-CM | POA: Diagnosis not present

## 2017-12-08 DIAGNOSIS — M47892 Other spondylosis, cervical region: Secondary | ICD-10-CM | POA: Diagnosis not present

## 2017-12-08 DIAGNOSIS — Z7982 Long term (current) use of aspirin: Secondary | ICD-10-CM | POA: Diagnosis not present

## 2017-12-08 DIAGNOSIS — I10 Essential (primary) hypertension: Secondary | ICD-10-CM | POA: Diagnosis not present

## 2017-12-08 DIAGNOSIS — Z4789 Encounter for other orthopedic aftercare: Secondary | ICD-10-CM | POA: Diagnosis not present

## 2017-12-08 DIAGNOSIS — G4733 Obstructive sleep apnea (adult) (pediatric): Secondary | ICD-10-CM | POA: Diagnosis not present

## 2017-12-09 DIAGNOSIS — I10 Essential (primary) hypertension: Secondary | ICD-10-CM | POA: Diagnosis not present

## 2017-12-09 DIAGNOSIS — Z981 Arthrodesis status: Secondary | ICD-10-CM | POA: Diagnosis not present

## 2017-12-09 DIAGNOSIS — Z79891 Long term (current) use of opiate analgesic: Secondary | ICD-10-CM | POA: Diagnosis not present

## 2017-12-09 DIAGNOSIS — Z7982 Long term (current) use of aspirin: Secondary | ICD-10-CM | POA: Diagnosis not present

## 2017-12-09 DIAGNOSIS — G4733 Obstructive sleep apnea (adult) (pediatric): Secondary | ICD-10-CM | POA: Diagnosis not present

## 2017-12-09 DIAGNOSIS — Z7984 Long term (current) use of oral hypoglycemic drugs: Secondary | ICD-10-CM | POA: Diagnosis not present

## 2017-12-09 DIAGNOSIS — Z4789 Encounter for other orthopedic aftercare: Secondary | ICD-10-CM | POA: Diagnosis not present

## 2017-12-09 DIAGNOSIS — E1159 Type 2 diabetes mellitus with other circulatory complications: Secondary | ICD-10-CM | POA: Diagnosis not present

## 2017-12-09 DIAGNOSIS — E559 Vitamin D deficiency, unspecified: Secondary | ICD-10-CM | POA: Diagnosis not present

## 2017-12-09 DIAGNOSIS — Z9181 History of falling: Secondary | ICD-10-CM | POA: Diagnosis not present

## 2017-12-09 DIAGNOSIS — M47892 Other spondylosis, cervical region: Secondary | ICD-10-CM | POA: Diagnosis not present

## 2017-12-09 DIAGNOSIS — M150 Primary generalized (osteo)arthritis: Secondary | ICD-10-CM | POA: Diagnosis not present

## 2017-12-11 ENCOUNTER — Encounter: Payer: Self-pay | Admitting: Gastroenterology

## 2017-12-11 ENCOUNTER — Telehealth: Payer: Self-pay | Admitting: Gastroenterology

## 2017-12-11 ENCOUNTER — Ambulatory Visit (INDEPENDENT_AMBULATORY_CARE_PROVIDER_SITE_OTHER): Payer: Medicare Other | Admitting: Gastroenterology

## 2017-12-11 VITALS — BP 128/72 | HR 75 | Ht 66.0 in | Wt 210.2 lb

## 2017-12-11 DIAGNOSIS — K625 Hemorrhage of anus and rectum: Secondary | ICD-10-CM | POA: Diagnosis not present

## 2017-12-11 DIAGNOSIS — K602 Anal fissure, unspecified: Secondary | ICD-10-CM | POA: Diagnosis not present

## 2017-12-11 MED ORDER — PROCTOZONE-HC 2.5 % RE CREA: TOPICAL_CREAM | Freq: Two times a day (BID) | RECTAL | 0 refills | Status: AC

## 2017-12-11 MED ORDER — DILTIAZEM GEL 2 %: 1.0000 "application " | Freq: Three times a day (TID) | CUTANEOUS | 1 refills | Status: DC

## 2017-12-11 NOTE — Patient Instructions (Signed)
If you are age 66 or older, your body mass index should be between 23-30. Your Body mass index is 33.94 kg/m. If this is out of the aforementioned range listed, please consider follow up with your Primary Care Provider.  If you are age 54 or younger, your body mass index should be between 19-25. Your Body mass index is 33.94 kg/m. If this is out of the aformentioned range listed, please consider follow up with your Primary Care Provider.   We have sent the following medications to your pharmacy for you to pick up at your convenience: Diltiazem 2% three times daily x 4 weeks Anusol 2.5% twice daily x 14 days.   It has been recommended to you by your physician that you have a(n) Colonoscopy completed. He would like you to have this done in 3 months.  Please contact our office at [phone]  to have the procedure scheduled.  Thank you,  Dr. Jackquline Denmark

## 2017-12-11 NOTE — Progress Notes (Signed)
IMPRESSION and PLAN:    #1. Chronic constipation (worse after back surgery July 2019) - better now #2.  Rectal bleeding due to hoids/fissure. R/O other causes.  Last colonoscopy 07/2011 showing moderate sigmoid diverticulosis, small internal hemorrhoids.  Colon was difficult to examine due to redundancy. -Prune 1/day -Increase water intake -Anusol HC cream 2.5% 1 twice daily after the bowel movement for 10 to 14 days. -Diltiazem 2% gel 3 times a day for 4 weeks.  -Recommend colonoscopy in 3 months since patient recently had back surgery. Pt will also discussed with Dr Patrice Paradise. -Patient and patient's husband will call us in 2 weeks if she still has problems.      HPI:    Chief Complaint:   Megan Galvan is a 67 y.o. female  Status post back surgery November 17, 2017 Had severe constipation thereafter Had been taking stool softeners and prunes. Started having rectal bleeding with rectal pain. Evaluated by Dr. Marguerite Olea on Anusol 2.5% HC cream with some relief. 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 Has been off all pain medications Has appointment with Dr. Patrice Paradise in coming weeks   Past Medical History:  Diagnosis Date  . Arthritis   . Bone spur of acromioclavicular joint, left   . GERD (gastroesophageal reflux disease)   . Hyperlipidemia   . Hypertension   . Plantar fasciitis     Current Outpatient Medications  Medication Sig Dispense Refill  . Acetaminophen 500 MG coapsule 1 capsule as needed.    . Azilsartan Medoxomil 40 MG TABS Take 2 tablets by mouth daily.    Marland Kitchen CALCIUM CITRATE-VITAMIN D PO Take 1 tablet by mouth daily.    . cetirizine (ZYRTEC) 10 MG tablet Take 1 tablet by mouth daily.    . chlorthalidone (HYGROTON) 25 MG tablet Take 1 tablet by mouth daily.    . fluticasone (FLONASE) 50 MCG/ACT nasal spray Place 1 spray into the nose as needed.    . lansoprazole (PREVACID) 30 MG capsule Take 1 capsule by mouth 2 (two) times daily.    .  Melatonin 5 MG TABS Take 1 tablet by mouth at bedtime.    . metFORMIN (GLUCOPHAGE) 1000 MG tablet Take 2 tablets by mouth.    . metoprolol succinate (TOPROL-XL) 50 MG 24 hr tablet Take 1 tablet by mouth daily.    . Multiple Vitamin (MULTI-VITAMINS) TABS Take 1 tablet by mouth daily.    . potassium chloride SA (K-DUR,KLOR-CON) 20 MEQ tablet Take 3 tablets by mouth daily.    . pravastatin (PRAVACHOL) 40 MG tablet Take 1 tablet by mouth at bedtime.    Marland Kitchen PROCTOZONE-HC 2.5 % rectal cream APPLY A THIN LAYER TO THE AFFECTED AREA RECTALLY TWICE DAILY  0  . ranitidine (ZANTAC) 300 MG tablet Take 1 tablet by mouth at bedtime.    . traZODone (DESYREL) 100 MG tablet Take 1 tablet by mouth at bedtime.     No current facility-administered medications for this visit.     Past Surgical History:  Procedure Laterality Date  . BACK SURGERY  11/17/2016   L3-L5  . BILATERAL CARPAL TUNNEL RELEASE    . bone spur Left    left shoulder  . bone spur Right    Right shoulder  . COLONOSCOPY  08/22/2011   Moderate predominantly sigmoid diverticulosis. Small internal hemorrhoids.   Marland Kitchen NECK SURGERY    . NECK SURGERY    . torn rotator cuff Right    Right  shoulder  . torn rotator cuff Left    left shoulder  . TRIGGER FINGER RELEASE Left    thumb  . TRIGGER FINGER RELEASE Right    thumb and middle finger  . WRIST SURGERY Left    torn ligament  . WRIST SURGERY Right    cyst    Family History  Problem Relation Age of Onset  . Colon cancer Paternal Grandmother     Social History   Tobacco Use  . Smoking status: Never Smoker  . Smokeless tobacco: Never Used  Substance Use Topics  . Alcohol use: Never    Frequency: Never  . Drug use: Never    Allergies  Allergen Reactions  . Doxycycline     Rash   . Latex     Burn and itch     Review of Systems: All systems reviewed and negative except where noted in HPI.    Physical Exam:     BP 128/72   Pulse 75   Ht 5\' 6"  (1.676 m)   Wt 210 lb 4  oz (95.4 kg)   BMI 33.94 kg/m  @WEIGHTLAST3 @ GENERAL:  Alert, oriented, cooperative, not in acute distress. PSYCH: :Pleasant, normal mood and affect. HEENT:  conjunctiva pink, mucous membranes moist, neck supple without masses. No jaundice. CARDIAC:  S1 S2 normal. No murmers. PULM: Normal respiratory effort, lungs CTA bilaterally, no wheezing. ABDOMEN: Inspection: No visible peristalsis, no abnormal pulsations, skin normal.  Palpation/percussion: Soft, nontender, nondistended, no rigidity, no abnormal dullness to percussion, no hepatosplenomegaly and no palpable abdominal masses.  Auscultation: Normal bowel sounds, no abdominal bruits. Rectal exam: Posterior anal fissure with rectal spasms, small internal hemorrhoids, heme-negative stools SKIN:  turgor, no lesions seen. Musculoskeletal:  Normal muscle tone, normal strength. NEURO: Alert and oriented x 3, no focal neurologic deficits. Seen and rectal exam in presence of Robin CMA. Pt's husband was present throughout the history taking and exam.     Azzan Butler,MD 12/11/2017, 3:31 PM   CC Cox, Elnita Maxwell, MD

## 2017-12-11 NOTE — Telephone Encounter (Signed)
[facility] in [place] called to inform that prescription for diltiazem gel is a compound and they do not do it.

## 2017-12-12 DIAGNOSIS — I10 Essential (primary) hypertension: Secondary | ICD-10-CM | POA: Diagnosis not present

## 2017-12-12 DIAGNOSIS — E1159 Type 2 diabetes mellitus with other circulatory complications: Secondary | ICD-10-CM | POA: Diagnosis not present

## 2017-12-12 DIAGNOSIS — G4733 Obstructive sleep apnea (adult) (pediatric): Secondary | ICD-10-CM | POA: Diagnosis not present

## 2017-12-12 DIAGNOSIS — Z7984 Long term (current) use of oral hypoglycemic drugs: Secondary | ICD-10-CM | POA: Diagnosis not present

## 2017-12-12 DIAGNOSIS — Z9181 History of falling: Secondary | ICD-10-CM | POA: Diagnosis not present

## 2017-12-12 DIAGNOSIS — Z7982 Long term (current) use of aspirin: Secondary | ICD-10-CM | POA: Diagnosis not present

## 2017-12-12 DIAGNOSIS — E559 Vitamin D deficiency, unspecified: Secondary | ICD-10-CM | POA: Diagnosis not present

## 2017-12-12 DIAGNOSIS — Z981 Arthrodesis status: Secondary | ICD-10-CM | POA: Diagnosis not present

## 2017-12-12 DIAGNOSIS — M47892 Other spondylosis, cervical region: Secondary | ICD-10-CM | POA: Diagnosis not present

## 2017-12-12 DIAGNOSIS — Z79891 Long term (current) use of opiate analgesic: Secondary | ICD-10-CM | POA: Diagnosis not present

## 2017-12-12 DIAGNOSIS — M150 Primary generalized (osteo)arthritis: Secondary | ICD-10-CM | POA: Diagnosis not present

## 2017-12-12 DIAGNOSIS — Z4789 Encounter for other orthopedic aftercare: Secondary | ICD-10-CM | POA: Diagnosis not present

## 2017-12-12 MED ORDER — DILTIAZEM GEL 2 %: 1.0000 "application " | Freq: Three times a day (TID) | CUTANEOUS | 1 refills | Status: DC

## 2017-12-12 NOTE — Addendum Note (Signed)
Addended by: Karena Addison on: 12/12/2017 09:56 AM   Modules accepted: Orders

## 2017-12-12 NOTE — Telephone Encounter (Signed)
Sent prescription to [facility], notified patient.

## 2017-12-15 DIAGNOSIS — M5106 Intervertebral disc disorders with myelopathy, lumbar region: Secondary | ICD-10-CM | POA: Diagnosis not present

## 2017-12-15 DIAGNOSIS — M4726 Other spondylosis with radiculopathy, lumbar region: Secondary | ICD-10-CM | POA: Diagnosis not present

## 2017-12-15 DIAGNOSIS — M4326 Fusion of spine, lumbar region: Secondary | ICD-10-CM | POA: Diagnosis not present

## 2017-12-15 DIAGNOSIS — Z981 Arthrodesis status: Secondary | ICD-10-CM | POA: Diagnosis not present

## 2017-12-15 DIAGNOSIS — M5116 Intervertebral disc disorders with radiculopathy, lumbar region: Secondary | ICD-10-CM | POA: Diagnosis not present

## 2017-12-16 DIAGNOSIS — I10 Essential (primary) hypertension: Secondary | ICD-10-CM | POA: Diagnosis not present

## 2017-12-16 DIAGNOSIS — E1159 Type 2 diabetes mellitus with other circulatory complications: Secondary | ICD-10-CM | POA: Diagnosis not present

## 2017-12-16 DIAGNOSIS — G4733 Obstructive sleep apnea (adult) (pediatric): Secondary | ICD-10-CM | POA: Diagnosis not present

## 2017-12-16 DIAGNOSIS — E559 Vitamin D deficiency, unspecified: Secondary | ICD-10-CM | POA: Diagnosis not present

## 2017-12-16 DIAGNOSIS — Z981 Arthrodesis status: Secondary | ICD-10-CM | POA: Diagnosis not present

## 2017-12-16 DIAGNOSIS — Z9181 History of falling: Secondary | ICD-10-CM | POA: Diagnosis not present

## 2017-12-16 DIAGNOSIS — Z7982 Long term (current) use of aspirin: Secondary | ICD-10-CM | POA: Diagnosis not present

## 2017-12-16 DIAGNOSIS — M150 Primary generalized (osteo)arthritis: Secondary | ICD-10-CM | POA: Diagnosis not present

## 2017-12-16 DIAGNOSIS — Z79891 Long term (current) use of opiate analgesic: Secondary | ICD-10-CM | POA: Diagnosis not present

## 2017-12-16 DIAGNOSIS — M47892 Other spondylosis, cervical region: Secondary | ICD-10-CM | POA: Diagnosis not present

## 2017-12-16 DIAGNOSIS — Z4789 Encounter for other orthopedic aftercare: Secondary | ICD-10-CM | POA: Diagnosis not present

## 2017-12-16 DIAGNOSIS — Z7984 Long term (current) use of oral hypoglycemic drugs: Secondary | ICD-10-CM | POA: Diagnosis not present

## 2017-12-17 ENCOUNTER — Encounter: Payer: Self-pay | Admitting: Gastroenterology

## 2017-12-17 DIAGNOSIS — M150 Primary generalized (osteo)arthritis: Secondary | ICD-10-CM | POA: Diagnosis not present

## 2017-12-17 DIAGNOSIS — Z9181 History of falling: Secondary | ICD-10-CM | POA: Diagnosis not present

## 2017-12-17 DIAGNOSIS — E559 Vitamin D deficiency, unspecified: Secondary | ICD-10-CM | POA: Diagnosis not present

## 2017-12-17 DIAGNOSIS — E1159 Type 2 diabetes mellitus with other circulatory complications: Secondary | ICD-10-CM | POA: Diagnosis not present

## 2017-12-17 DIAGNOSIS — Z4789 Encounter for other orthopedic aftercare: Secondary | ICD-10-CM | POA: Diagnosis not present

## 2017-12-17 DIAGNOSIS — M47892 Other spondylosis, cervical region: Secondary | ICD-10-CM | POA: Diagnosis not present

## 2017-12-17 DIAGNOSIS — Z79891 Long term (current) use of opiate analgesic: Secondary | ICD-10-CM | POA: Diagnosis not present

## 2017-12-17 DIAGNOSIS — G4733 Obstructive sleep apnea (adult) (pediatric): Secondary | ICD-10-CM | POA: Diagnosis not present

## 2017-12-17 DIAGNOSIS — Z7982 Long term (current) use of aspirin: Secondary | ICD-10-CM | POA: Diagnosis not present

## 2017-12-17 DIAGNOSIS — Z981 Arthrodesis status: Secondary | ICD-10-CM | POA: Diagnosis not present

## 2017-12-17 DIAGNOSIS — I10 Essential (primary) hypertension: Secondary | ICD-10-CM | POA: Diagnosis not present

## 2017-12-17 DIAGNOSIS — Z7984 Long term (current) use of oral hypoglycemic drugs: Secondary | ICD-10-CM | POA: Diagnosis not present

## 2017-12-18 ENCOUNTER — Telehealth: Payer: Self-pay | Admitting: Gastroenterology

## 2017-12-18 NOTE — Telephone Encounter (Signed)
I spoke with the patient and she reports that the Diltiazem cream has not been effective and she is requesting something different if possible.  Please advise.  Thank you.

## 2017-12-19 NOTE — Telephone Encounter (Signed)
Pt calling back in regards of this states she really needs to hear back from [place] needs help.

## 2017-12-22 ENCOUNTER — Telehealth: Payer: Self-pay | Admitting: Gastroenterology

## 2017-12-22 DIAGNOSIS — M150 Primary generalized (osteo)arthritis: Secondary | ICD-10-CM | POA: Diagnosis not present

## 2017-12-22 DIAGNOSIS — E1159 Type 2 diabetes mellitus with other circulatory complications: Secondary | ICD-10-CM | POA: Diagnosis not present

## 2017-12-22 DIAGNOSIS — E559 Vitamin D deficiency, unspecified: Secondary | ICD-10-CM | POA: Diagnosis not present

## 2017-12-22 DIAGNOSIS — Z981 Arthrodesis status: Secondary | ICD-10-CM | POA: Diagnosis not present

## 2017-12-22 DIAGNOSIS — G4733 Obstructive sleep apnea (adult) (pediatric): Secondary | ICD-10-CM | POA: Diagnosis not present

## 2017-12-22 DIAGNOSIS — Z9181 History of falling: Secondary | ICD-10-CM | POA: Diagnosis not present

## 2017-12-22 DIAGNOSIS — Z7982 Long term (current) use of aspirin: Secondary | ICD-10-CM | POA: Diagnosis not present

## 2017-12-22 DIAGNOSIS — Z4789 Encounter for other orthopedic aftercare: Secondary | ICD-10-CM | POA: Diagnosis not present

## 2017-12-22 DIAGNOSIS — M47892 Other spondylosis, cervical region: Secondary | ICD-10-CM | POA: Diagnosis not present

## 2017-12-22 DIAGNOSIS — Z79891 Long term (current) use of opiate analgesic: Secondary | ICD-10-CM | POA: Diagnosis not present

## 2017-12-22 DIAGNOSIS — Z7984 Long term (current) use of oral hypoglycemic drugs: Secondary | ICD-10-CM | POA: Diagnosis not present

## 2017-12-22 DIAGNOSIS — I10 Essential (primary) hypertension: Secondary | ICD-10-CM | POA: Diagnosis not present

## 2017-12-22 NOTE — Telephone Encounter (Signed)
Pt has been having diarrea for the past 3 days, wants to know what she can take. Pls call her

## 2017-12-22 NOTE — Telephone Encounter (Signed)
She is having 3-4 loose stools daily.  She is going to increase the Immodium to 3 times daily and see if that helps.

## 2017-12-25 DIAGNOSIS — M150 Primary generalized (osteo)arthritis: Secondary | ICD-10-CM | POA: Diagnosis not present

## 2017-12-25 DIAGNOSIS — M47892 Other spondylosis, cervical region: Secondary | ICD-10-CM | POA: Diagnosis not present

## 2017-12-25 DIAGNOSIS — Z7982 Long term (current) use of aspirin: Secondary | ICD-10-CM | POA: Diagnosis not present

## 2017-12-25 DIAGNOSIS — E1159 Type 2 diabetes mellitus with other circulatory complications: Secondary | ICD-10-CM | POA: Diagnosis not present

## 2017-12-25 DIAGNOSIS — I10 Essential (primary) hypertension: Secondary | ICD-10-CM | POA: Diagnosis not present

## 2017-12-25 DIAGNOSIS — Z9181 History of falling: Secondary | ICD-10-CM | POA: Diagnosis not present

## 2017-12-25 DIAGNOSIS — Z981 Arthrodesis status: Secondary | ICD-10-CM | POA: Diagnosis not present

## 2017-12-25 DIAGNOSIS — G4733 Obstructive sleep apnea (adult) (pediatric): Secondary | ICD-10-CM | POA: Diagnosis not present

## 2017-12-25 DIAGNOSIS — E559 Vitamin D deficiency, unspecified: Secondary | ICD-10-CM | POA: Diagnosis not present

## 2017-12-25 DIAGNOSIS — Z7984 Long term (current) use of oral hypoglycemic drugs: Secondary | ICD-10-CM | POA: Diagnosis not present

## 2017-12-25 DIAGNOSIS — Z79891 Long term (current) use of opiate analgesic: Secondary | ICD-10-CM | POA: Diagnosis not present

## 2017-12-25 DIAGNOSIS — Z4789 Encounter for other orthopedic aftercare: Secondary | ICD-10-CM | POA: Diagnosis not present

## 2017-12-31 DIAGNOSIS — K219 Gastro-esophageal reflux disease without esophagitis: Secondary | ICD-10-CM | POA: Diagnosis not present

## 2017-12-31 DIAGNOSIS — E1142 Type 2 diabetes mellitus with diabetic polyneuropathy: Secondary | ICD-10-CM | POA: Diagnosis not present

## 2017-12-31 DIAGNOSIS — Z23 Encounter for immunization: Secondary | ICD-10-CM | POA: Diagnosis not present

## 2017-12-31 DIAGNOSIS — E1140 Type 2 diabetes mellitus with diabetic neuropathy, unspecified: Secondary | ICD-10-CM | POA: Diagnosis not present

## 2017-12-31 DIAGNOSIS — I10 Essential (primary) hypertension: Secondary | ICD-10-CM | POA: Diagnosis not present

## 2017-12-31 DIAGNOSIS — G4733 Obstructive sleep apnea (adult) (pediatric): Secondary | ICD-10-CM | POA: Diagnosis not present

## 2017-12-31 DIAGNOSIS — E782 Mixed hyperlipidemia: Secondary | ICD-10-CM | POA: Diagnosis not present

## 2018-01-07 DIAGNOSIS — R197 Diarrhea, unspecified: Secondary | ICD-10-CM | POA: Diagnosis not present

## 2018-01-13 ENCOUNTER — Telehealth: Payer: Self-pay | Admitting: Gastroenterology

## 2018-01-13 MED ORDER — HYDROCORTISONE 2.5 % RE CREA: 1.0000 "application " | TOPICAL_CREAM | Freq: Two times a day (BID) | RECTAL | 1 refills | Status: DC

## 2018-01-13 NOTE — Telephone Encounter (Signed)
Spoke with pt and she is aware. Scripts sent to pharmacy. 

## 2018-01-13 NOTE — Telephone Encounter (Signed)
Let us restart -Anusol HC cream 2.5% 1 twice daily after the bowel movement for 10 to 14 days. -Diltiazem 2% gel 3 times a day for 4 weeks. -Sitz bath's twice a day -If not better, can we work her in [facility]

## 2018-01-13 NOTE — Telephone Encounter (Signed)
Pt states that she is out of her medication for hemorrhoids and fissure. Reports she was getting better but now her hemorrhoids are quite swollen and there was a lot of blood in the toilet. Also reports she is having a lot of pain in her rectum. Please advise.

## 2018-01-13 NOTE — Telephone Encounter (Signed)
Patient states her hemorrhoid medication was working but now she is seeing some blood and having pain. Patient wanting advice from the nurse.

## 2018-01-13 NOTE — Telephone Encounter (Signed)
Diltiazem gel refilled at [facility].

## 2018-02-05 DIAGNOSIS — G4733 Obstructive sleep apnea (adult) (pediatric): Secondary | ICD-10-CM | POA: Diagnosis not present

## 2018-02-11 DIAGNOSIS — M5116 Intervertebral disc disorders with radiculopathy, lumbar region: Secondary | ICD-10-CM | POA: Diagnosis not present

## 2018-02-11 DIAGNOSIS — M4326 Fusion of spine, lumbar region: Secondary | ICD-10-CM | POA: Diagnosis not present

## 2018-02-12 ENCOUNTER — Other Ambulatory Visit: Payer: Self-pay

## 2018-02-12 ENCOUNTER — Ambulatory Visit (AMBULATORY_SURGERY_CENTER): Payer: Self-pay

## 2018-02-12 VITALS — Ht 66.0 in | Wt 212.6 lb

## 2018-02-12 DIAGNOSIS — K625 Hemorrhage of anus and rectum: Secondary | ICD-10-CM

## 2018-02-12 MED ORDER — NA SULFATE-K SULFATE-MG SULF 17.5-3.13-1.6 GM/177ML PO SOLN: 1.0000 | Freq: Once | ORAL | 0 refills | Status: AC

## 2018-02-12 NOTE — Progress Notes (Signed)
No egg or soy allergy known to patient  No issues with past sedation with any surgeries  or procedures, no intubation problems  No diet pills per patient No home 02 use per patient  No blood thinners per patient  Pt denies issues with constipation  No A fib or A flutter  EMMI video sent to pt's e mail  

## 2018-02-16 ENCOUNTER — Telehealth: Payer: Self-pay | Admitting: Gastroenterology

## 2018-02-16 NOTE — Telephone Encounter (Signed)
Patient states she used the medication creams that Dr.Gupta advised her to use for 4 weeks. Patient states they helped tremendously but when the 4 weeks was up everything went back to the same symptoms. Patient wanting to know does she keep using medication or do something else. Pt scheduled for colon on 10.29.19.

## 2018-02-16 NOTE — Telephone Encounter (Signed)
Pt scheduled to see Dr. Lyndel Safe tomorrow at 3:45pm. Pt aware of appt.

## 2018-02-17 ENCOUNTER — Ambulatory Visit (INDEPENDENT_AMBULATORY_CARE_PROVIDER_SITE_OTHER): Payer: Medicare Other | Admitting: Gastroenterology

## 2018-02-17 ENCOUNTER — Encounter: Payer: Self-pay | Admitting: Gastroenterology

## 2018-02-17 VITALS — BP 144/82 | HR 65 | Ht 66.0 in | Wt 209.2 lb

## 2018-02-17 DIAGNOSIS — K625 Hemorrhage of anus and rectum: Secondary | ICD-10-CM | POA: Diagnosis not present

## 2018-02-17 MED ORDER — HYDROCORTISONE 2.5 % RE CREA: 1.0000 "application " | TOPICAL_CREAM | Freq: Two times a day (BID) | RECTAL | 2 refills | Status: AC

## 2018-02-17 MED ORDER — HYDROCORTISONE 2.5 % RE CREA: 1.0000 "application " | TOPICAL_CREAM | Freq: Two times a day (BID) | RECTAL | 6 refills | Status: DC

## 2018-02-17 NOTE — Patient Instructions (Signed)
If you are age 66 or older, your body mass index should be between 23-30. Your Body mass index is 33.77 kg/m. If this is out of the aforementioned range listed, please consider follow up with your Primary Care Provider.  If you are age 38 or younger, your body mass index should be between 19-25. Your Body mass index is 33.77 kg/m. If this is out of the aformentioned range listed, please consider follow up with your Primary Care Provider.   We have sent the following medications to your pharmacy for you to pick up at your convenience: Anusol  Thank you,  Dr. Jackquline Denmark

## 2018-02-17 NOTE — Progress Notes (Signed)
IMPRESSION and PLAN:    #1. Chronic constipation (worse after back surgery July 2019) - better now #2. Rectal bleeding due to hoids/fissure. R/O other causes.  Last colonoscopy 07/2011 showing moderate sigmoid diverticulosis, small internal hemorrhoids.  Colon was difficult to examine due to redundancy.  Plan: -Prune 1/day -Increase water intake. -Anusol HC cream 2.5% 1 twice daily after the bowel movement for 10 to 14 days. -Discontinued diltiazem 2% gel 3 times a day for 4 weeks.  -Proceed with colonoscopy after 2-day preparation.  This is already scheduled.  I have explained risks and benefits.  Okay with Dr Patrice Paradise to proceed. -Patient and patient's husband will call us in 2 weeks if she still has problems. -May need hemorrhoid banding or surgical hemorrhoidectomy thereafter.      HPI:    Chief Complaint:   Megan Galvan is a 67 y.o. female  Status post back surgery November 17, 2017 Doing much better Had severe constipation thereafter -better with prunes.  She has stopped taking stool softeners Would occasionally have diarrhea Started having rectal bleeding with rectal pain. Still using Anusol 2.5% HC cream with some relief. Diltiazem cream has been discontinued. Here as a pre-colonoscopy visit.   Past Medical History:  Diagnosis Date  . Arthritis   . Bone spur of acromioclavicular joint, left   . GERD (gastroesophageal reflux disease)   . Hyperlipidemia   . Hypertension   . Plantar fasciitis     Current Outpatient Medications  Medication Sig Dispense Refill  . Acetaminophen 500 MG coapsule 1 capsule as needed.    . Azilsartan Medoxomil 40 MG TABS Take 2 tablets by mouth daily.    Marland Kitchen CALCIUM CITRATE-VITAMIN D PO Take 1 tablet by mouth daily.    . cetirizine (ZYRTEC) 10 MG tablet Take 1 tablet by mouth daily.    . chlorthalidone (HYGROTON) 25 MG tablet Take 1 tablet by mouth daily.    . fluticasone (FLONASE) 50 MCG/ACT nasal spray Place 1 spray into the nose as  needed.    . lansoprazole (PREVACID) 30 MG capsule Take 1 capsule by mouth 2 (two) times daily.    . Melatonin 5 MG TABS Take 1 tablet by mouth at bedtime.    . metFORMIN (GLUCOPHAGE) 1000 MG tablet Take 2 tablets by mouth.    . metoprolol succinate (TOPROL-XL) 50 MG 24 hr tablet Take 1 tablet by mouth daily.    . Multiple Vitamin (MULTI-VITAMINS) TABS Take 1 tablet by mouth daily.    . potassium chloride SA (K-DUR,KLOR-CON) 20 MEQ tablet Take 3 tablets by mouth daily.    . pravastatin (PRAVACHOL) 40 MG tablet Take 1 tablet by mouth at bedtime.    Marland Kitchen PROCTOZONE-HC 2.5 % rectal cream APPLY A THIN LAYER TO THE AFFECTED AREA RECTALLY TWICE DAILY  0  . ranitidine (ZANTAC) 300 MG tablet Take 1 tablet by mouth at bedtime.    . traZODone (DESYREL) 100 MG tablet Take 1 tablet by mouth at bedtime.     No current facility-administered medications for this visit.     Past Surgical History:  Procedure Laterality Date  . BACK SURGERY  11/17/2016   L3-L5  . BILATERAL CARPAL TUNNEL RELEASE    . bone spur Left    left shoulder  . bone spur Right    Right shoulder  . COLONOSCOPY  08/22/2011   Moderate predominantly sigmoid diverticulosis. Small internal hemorrhoids.   Marland Kitchen NECK SURGERY    . NECK SURGERY    .  torn rotator cuff Right    Right shoulder  . torn rotator cuff Left    left shoulder  . TRIGGER FINGER RELEASE Left    thumb  . TRIGGER FINGER RELEASE Right    thumb and middle finger  . WRIST SURGERY Left    torn ligament  . WRIST SURGERY Right    cyst    Family History  Problem Relation Age of Onset  . Colon cancer Paternal Grandmother     Social History   Tobacco Use  . Smoking status: Never Smoker  . Smokeless tobacco: Never Used  Substance Use Topics  . Alcohol use: Never    Frequency: Never  . Drug use: Never    Allergies  Allergen Reactions  . Doxycycline     Rash   . Latex     Burn and itch     Review of Systems: All systems reviewed and negative except  where noted in HPI.    Physical Exam:     BP 128/72   Pulse 75   Ht 5\' 6"  (1.676 m)   Wt 210 lb 4 oz (95.4 kg)   BMI 33.94 kg/m  @WEIGHTLAST3 @ GENERAL:  Alert, oriented, cooperative, not in acute distress. PSYCH: :Pleasant, normal mood and affect. HEENT:  conjunctiva pink, mucous membranes moist, neck supple without masses. No jaundice. CARDIAC:  S1 S2 normal. No murmers. PULM: Normal respiratory effort, lungs CTA bilaterally, no wheezing. ABDOMEN: Inspection: No visible peristalsis, no abnormal pulsations, skin normal.  Palpation/percussion: Soft, nontender, nondistended, no rigidity, no abnormal dullness to percussion, no hepatosplenomegaly and no palpable abdominal masses.  Auscultation: Normal bowel sounds, no abdominal bruits. Rectal exam: To be performed at the time of colonoscopy SKIN:  turgor, no lesions seen. Musculoskeletal:  Normal muscle tone, normal strength. NEURO: Alert and oriented x 3, no focal neurologic deficits. Pt's husband was present throughout the history taking and exam.     Lenox Bink,MD 12/11/2017, 3:31 PM   CC Cox, Elnita Maxwell, MD

## 2018-02-24 ENCOUNTER — Encounter: Payer: Self-pay | Admitting: Gastroenterology

## 2018-02-24 ENCOUNTER — Ambulatory Visit (AMBULATORY_SURGERY_CENTER): Payer: Medicare Other | Admitting: Gastroenterology

## 2018-02-24 VITALS — BP 115/58 | HR 57 | Temp 97.8°F | Resp 12 | Ht 66.0 in | Wt 209.0 lb

## 2018-02-24 DIAGNOSIS — D120 Benign neoplasm of cecum: Secondary | ICD-10-CM | POA: Diagnosis not present

## 2018-02-24 DIAGNOSIS — K625 Hemorrhage of anus and rectum: Secondary | ICD-10-CM

## 2018-02-24 DIAGNOSIS — K5730 Diverticulosis of large intestine without perforation or abscess without bleeding: Secondary | ICD-10-CM | POA: Diagnosis not present

## 2018-02-24 DIAGNOSIS — K641 Second degree hemorrhoids: Secondary | ICD-10-CM

## 2018-02-24 MED ORDER — SODIUM CHLORIDE 0.9 % IV SOLN: 500.0000 mL | Freq: Once | INTRAVENOUS | Status: DC

## 2018-02-24 NOTE — Progress Notes (Signed)
Pt's states no medical or surgical changes since previsit or office visit. 

## 2018-02-24 NOTE — Progress Notes (Signed)
PT taken to PACU. Monitors in place. VSS. Report given to RN. 

## 2018-02-24 NOTE — Patient Instructions (Signed)
YOU HAD AN ENDOSCOPIC PROCEDURE TODAY AT THE [facility]:   Refer to the procedure report that was given to you for any specific questions about what was found during the examination.  If the procedure report does not answer your questions, please call your gastroenterologist to clarify.  If you requested that your care partner not be given the details of your procedure findings, then the procedure report has been included in a sealed envelope for you to review at your convenience later.  YOU SHOULD EXPECT: Some feelings of bloating in the abdomen. Passage of more gas than usual.  Walking can help get rid of the air that was put into your GI tract during the procedure and reduce the bloating. If you had a lower endoscopy (such as a colonoscopy or flexible sigmoidoscopy) you may notice spotting of blood in your stool or on the toilet paper. If you underwent a bowel prep for your procedure, you may not have a normal bowel movement for a few days.  Please Note:  You might notice some irritation and congestion in your nose or some drainage.  This is from the oxygen used during your procedure.  There is no need for concern and it should clear up in a day or so.  SYMPTOMS TO REPORT IMMEDIATELY:   Following lower endoscopy (colonoscopy or flexible sigmoidoscopy):  Excessive amounts of blood in the stool  Significant tenderness or worsening of abdominal pains  Swelling of the abdomen that is new, acute  Fever of 100F or higher  For urgent or emergent issues, a gastroenterologist can be reached at any hour by calling [phone].   DIET:  We do recommend a small meal at first, but then you may proceed to your regular diet.  Drink plenty of fluids but you should avoid alcoholic beverages for 24 hours.  ACTIVITY:  You should plan to take it easy for the rest of today and you should NOT DRIVE or use heavy machinery until tomorrow (because of the sedation medicines used during the test).     FOLLOW UP: Our staff will call the number listed on your records the next business day following your procedure to check on you and address any questions or concerns that you may have regarding the information given to you following your procedure. If we do not reach you, we will leave a message.  However, if you are feeling well and you are not experiencing any problems, there is no need to return our call.  We will assume that you have returned to your regular daily activities without incident.  If any biopsies were taken you will be contacted by phone or by letter within the next 1-3 weeks.  Please call us at [phone] if you have not heard about the biopsies in 3 weeks.    Await for biopsy results Polyps (handout given) Hemorrhoids (handout given)  SIGNATURES/CONFIDENTIALITY: You and/or your care partner have signed paperwork which will be entered into your electronic medical record.  These signatures attest to the fact that that the information above on your After Visit Summary has been reviewed and is understood.  Full responsibility of the confidentiality of this discharge information lies with you and/or your care-partner. 

## 2018-02-24 NOTE — Progress Notes (Signed)
Called to room to assist during endoscopic procedure.  Patient ID and intended procedure confirmed with present staff. Received instructions for my participation in the procedure from the performing physician.  

## 2018-02-24 NOTE — Op Note (Signed)
[facility] Patient Name: Megan Galvan Procedure Date: 02/24/2018 10:33 AM MRN: [id number] Endoscopist: Jackquline Denmark , MD Age: 67 Referring MD:  Date of Birth: 1950-05-30 Gender: Female Account #: [id number] Procedure:                Colonoscopy Indications:              #1. Chronic constipation (worse after back surgery                            July 2019) - better now                           #2. Rectal bleeding Medicines:                Monitored Anesthesia Care Procedure:                Pre-Anesthesia Assessment:                           - Prior to the procedure, a History and Physical                            was performed, and patient medications and                            allergies were reviewed. The patient's tolerance of                            previous anesthesia was also reviewed. The risks                            and benefits of the procedure and the sedation                            options and risks were discussed with the patient.                            All questions were answered, and informed consent                            was obtained. Prior Anticoagulants: The patient has                            taken no previous anticoagulant or antiplatelet                            agents. ASA Grade Assessment: III - A patient with                            severe systemic disease. After reviewing the risks                            and benefits, the patient was deemed in  satisfactory condition to undergo the procedure.                           After obtaining informed consent, the colonoscope                            was passed under direct vision. Throughout the                            procedure, the patient's blood pressure, pulse, and                            oxygen saturations were monitored continuously. The                            Colonoscope was introduced through the anus and               advanced to the the cecum, identified by                            appendiceal orifice and ileocecal valve. The                            colonoscopy was performed without difficulty. The                            patient tolerated the procedure well. The quality                            of the bowel preparation was excellent. Scope In: 10:45:51 AM Scope Out: 11:02:49 AM Scope Withdrawal Time: 0 hours 10 minutes 26 seconds  Total Procedure Duration: 0 hours 16 minutes 58 seconds  Findings:                 A 6 mm polyp was found in the cecum. The polyp was                            sessile. The polyp was removed with a cold snare.                            Resection and retrieval were complete. Estimated                            blood loss: none.                           A few small-mouthed diverticula were found in the                            sigmoid colon, descending colon and rare in                            ascending colon.  Non-bleeding internal hemorrhoids were found during                            retroflexion and during perianal exam. The                            hemorrhoids were small. Completely healed posterior                            anal fissure.                           The exam was otherwise without abnormality. Complications:            No immediate complications. Estimated Blood Loss:     Estimated blood loss: none. Impression:               -Colonic polyp status post polypectomy                           -Predominantly left colonic diverticulosis.                           -Non-bleeding internal hemorrhoids.                           -Otherwise normal colonoscopy. Recommendation:           - Patient has a contact number available for                            emergencies. The signs and symptoms of potential                            delayed complications were discussed with the                             patient. Return to normal activities tomorrow.                            Written discharge instructions were provided to the                            patient.                           - Resume previous diet.                           - Continue present medications.                           - Await pathology results.                           - Repeat colonoscopy for surveillance based on                            pathology  results.                           - Return to GI clinic PRN. Jackquline Denmark, MD 02/24/2018 11:09:36 AM This report has been signed electronically.

## 2018-02-25 ENCOUNTER — Telehealth: Payer: Self-pay

## 2018-02-25 NOTE — Telephone Encounter (Signed)
  Follow up Call-  Call back number 02/24/2018  Post procedure Call Back phone  # [phone]  Permission to leave phone message Yes  Some recent data might be hidden     Patient questions:  Do you have a fever, pain , or abdominal swelling? No Pain Score  0  Have you tolerated food without any problems? Yes  Have you been able to return to your normal activities? Yes  Do you have any questions about your discharge instructions: Diet   No Medications  No Follow up visit  No  Do you have questions or concerns about your Care? No  Actions: * If pain score is 4 or above: "No action needed, pain <4."

## 2018-02-26 ENCOUNTER — Encounter: Payer: Self-pay | Admitting: Gastroenterology

## 2018-03-16 DIAGNOSIS — I10 Essential (primary) hypertension: Secondary | ICD-10-CM | POA: Diagnosis not present

## 2018-03-16 DIAGNOSIS — Z0000 Encounter for general adult medical examination without abnormal findings: Secondary | ICD-10-CM | POA: Diagnosis not present

## 2018-04-02 DIAGNOSIS — E782 Mixed hyperlipidemia: Secondary | ICD-10-CM | POA: Diagnosis not present

## 2018-04-02 DIAGNOSIS — I10 Essential (primary) hypertension: Secondary | ICD-10-CM | POA: Diagnosis not present

## 2018-04-02 DIAGNOSIS — G4733 Obstructive sleep apnea (adult) (pediatric): Secondary | ICD-10-CM | POA: Diagnosis not present

## 2018-04-02 DIAGNOSIS — E1142 Type 2 diabetes mellitus with diabetic polyneuropathy: Secondary | ICD-10-CM | POA: Diagnosis not present

## 2018-04-02 DIAGNOSIS — K219 Gastro-esophageal reflux disease without esophagitis: Secondary | ICD-10-CM | POA: Diagnosis not present

## 2018-04-13 DIAGNOSIS — Z1231 Encounter for screening mammogram for malignant neoplasm of breast: Secondary | ICD-10-CM | POA: Diagnosis not present

## 2018-05-01 ENCOUNTER — Telehealth: Payer: Self-pay | Admitting: Gastroenterology

## 2018-05-01 NOTE — Telephone Encounter (Signed)
Pt requesting rf for anusol hc 25 ml suppositories sent to [facility] in [place].

## 2018-05-01 NOTE — Telephone Encounter (Signed)
Would you like to refill this prescription?  

## 2018-05-04 MED ORDER — HYDROCORTISONE 2.5 % RE CREA: 1.0000 "application " | TOPICAL_CREAM | Freq: Two times a day (BID) | RECTAL | 6 refills | Status: AC

## 2018-05-04 NOTE — Telephone Encounter (Signed)
Please do Anusol HC suppositories 1 twice daily after the bowel movement for 10 days prn, 6 refills

## 2018-05-04 NOTE — Telephone Encounter (Signed)
Sent medication to patients pharmacy.  

## 2018-05-04 NOTE — Telephone Encounter (Signed)
Pt called to follow up on this request. I inform pt that prescription was approved by Dr. Lyndel Safe and will be sent to her pharmacy today.

## 2018-05-05 DIAGNOSIS — K600 Acute anal fissure: Secondary | ICD-10-CM | POA: Diagnosis not present

## 2018-05-05 DIAGNOSIS — K5904 Chronic idiopathic constipation: Secondary | ICD-10-CM | POA: Diagnosis not present

## 2018-05-12 ENCOUNTER — Ambulatory Visit: Payer: Medicare Other | Admitting: Gastroenterology

## 2018-05-13 DIAGNOSIS — G4733 Obstructive sleep apnea (adult) (pediatric): Secondary | ICD-10-CM | POA: Diagnosis not present

## 2018-05-14 DIAGNOSIS — M4326 Fusion of spine, lumbar region: Secondary | ICD-10-CM | POA: Diagnosis not present

## 2018-05-14 DIAGNOSIS — M5116 Intervertebral disc disorders with radiculopathy, lumbar region: Secondary | ICD-10-CM | POA: Diagnosis not present

## 2018-06-04 DIAGNOSIS — J018 Other acute sinusitis: Secondary | ICD-10-CM | POA: Diagnosis not present

## 2018-06-04 DIAGNOSIS — K600 Acute anal fissure: Secondary | ICD-10-CM | POA: Diagnosis not present

## 2018-06-20 DIAGNOSIS — G4733 Obstructive sleep apnea (adult) (pediatric): Secondary | ICD-10-CM | POA: Diagnosis not present

## 2018-07-28 DIAGNOSIS — E782 Mixed hyperlipidemia: Secondary | ICD-10-CM | POA: Diagnosis not present

## 2018-07-28 DIAGNOSIS — G4733 Obstructive sleep apnea (adult) (pediatric): Secondary | ICD-10-CM | POA: Diagnosis not present

## 2018-07-28 DIAGNOSIS — K219 Gastro-esophageal reflux disease without esophagitis: Secondary | ICD-10-CM | POA: Diagnosis not present

## 2018-07-28 DIAGNOSIS — E1142 Type 2 diabetes mellitus with diabetic polyneuropathy: Secondary | ICD-10-CM | POA: Diagnosis not present

## 2018-07-28 DIAGNOSIS — I10 Essential (primary) hypertension: Secondary | ICD-10-CM | POA: Diagnosis not present

## 2018-08-11 DIAGNOSIS — G4733 Obstructive sleep apnea (adult) (pediatric): Secondary | ICD-10-CM | POA: Diagnosis not present

## 2018-10-29 DIAGNOSIS — I10 Essential (primary) hypertension: Secondary | ICD-10-CM | POA: Diagnosis not present

## 2018-10-29 DIAGNOSIS — G4733 Obstructive sleep apnea (adult) (pediatric): Secondary | ICD-10-CM | POA: Diagnosis not present

## 2018-10-29 DIAGNOSIS — E782 Mixed hyperlipidemia: Secondary | ICD-10-CM | POA: Diagnosis not present

## 2018-10-29 DIAGNOSIS — K219 Gastro-esophageal reflux disease without esophagitis: Secondary | ICD-10-CM | POA: Diagnosis not present

## 2018-10-29 DIAGNOSIS — E1169 Type 2 diabetes mellitus with other specified complication: Secondary | ICD-10-CM | POA: Diagnosis not present

## 2018-10-29 DIAGNOSIS — E1142 Type 2 diabetes mellitus with diabetic polyneuropathy: Secondary | ICD-10-CM | POA: Diagnosis not present

## 2018-11-13 DIAGNOSIS — J018 Other acute sinusitis: Secondary | ICD-10-CM | POA: Diagnosis not present

## 2018-11-19 DIAGNOSIS — H25811 Combined forms of age-related cataract, right eye: Secondary | ICD-10-CM | POA: Diagnosis not present

## 2018-11-19 DIAGNOSIS — E119 Type 2 diabetes mellitus without complications: Secondary | ICD-10-CM | POA: Diagnosis not present

## 2018-11-25 DIAGNOSIS — M5116 Intervertebral disc disorders with radiculopathy, lumbar region: Secondary | ICD-10-CM | POA: Diagnosis not present

## 2018-11-25 DIAGNOSIS — M4326 Fusion of spine, lumbar region: Secondary | ICD-10-CM | POA: Diagnosis not present

## 2018-11-26 DIAGNOSIS — K219 Gastro-esophageal reflux disease without esophagitis: Secondary | ICD-10-CM | POA: Diagnosis not present

## 2018-11-26 DIAGNOSIS — G4733 Obstructive sleep apnea (adult) (pediatric): Secondary | ICD-10-CM | POA: Diagnosis not present

## 2018-12-21 ENCOUNTER — Other Ambulatory Visit: Payer: Self-pay

## 2018-12-21 ENCOUNTER — Ambulatory Visit (INDEPENDENT_AMBULATORY_CARE_PROVIDER_SITE_OTHER): Payer: Medicare Other | Admitting: Gastroenterology

## 2018-12-21 ENCOUNTER — Encounter: Payer: Self-pay | Admitting: Gastroenterology

## 2018-12-21 VITALS — BP 138/84 | HR 66 | Temp 98.4°F | Ht 66.0 in | Wt 208.1 lb

## 2018-12-21 DIAGNOSIS — K625 Hemorrhage of anus and rectum: Secondary | ICD-10-CM

## 2018-12-21 DIAGNOSIS — K649 Unspecified hemorrhoids: Secondary | ICD-10-CM

## 2018-12-21 DIAGNOSIS — K5904 Chronic idiopathic constipation: Secondary | ICD-10-CM | POA: Diagnosis not present

## 2018-12-21 MED ORDER — FLUTICASONE PROPIONATE 0.05 % EX CREA: TOPICAL_CREAM | Freq: Two times a day (BID) | CUTANEOUS | 2 refills | Status: AC

## 2018-12-21 NOTE — Patient Instructions (Addendum)
Your prescription for Fluticasone 0.05% cream has been sent to your pharmacy of choice.  Please use this cream twice a day for at least 10 days for relief from hemorrhoids. Usually used after a bowel movement and before bed.  Please eat one prune per day.    Use Miralax 17 grams (1 capfull) daily.    Increase your water intake.   If still with problems after these 10 days, please call the office for additional information/instructions.
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
## 2019-01-27 DIAGNOSIS — R21 Rash and other nonspecific skin eruption: Secondary | ICD-10-CM | POA: Diagnosis not present

## 2019-01-27 DIAGNOSIS — M25551 Pain in right hip: Secondary | ICD-10-CM | POA: Diagnosis not present

## 2019-02-01 DIAGNOSIS — M1611 Unilateral primary osteoarthritis, right hip: Secondary | ICD-10-CM | POA: Diagnosis not present

## 2019-02-01 DIAGNOSIS — M25551 Pain in right hip: Secondary | ICD-10-CM | POA: Diagnosis not present

## 2019-02-01 DIAGNOSIS — E782 Mixed hyperlipidemia: Secondary | ICD-10-CM | POA: Diagnosis not present

## 2019-02-01 DIAGNOSIS — I10 Essential (primary) hypertension: Secondary | ICD-10-CM | POA: Diagnosis not present

## 2019-02-01 DIAGNOSIS — G4733 Obstructive sleep apnea (adult) (pediatric): Secondary | ICD-10-CM | POA: Diagnosis not present

## 2019-02-01 DIAGNOSIS — E1142 Type 2 diabetes mellitus with diabetic polyneuropathy: Secondary | ICD-10-CM | POA: Diagnosis not present

## 2019-02-01 DIAGNOSIS — K219 Gastro-esophageal reflux disease without esophagitis: Secondary | ICD-10-CM | POA: Diagnosis not present

## 2019-02-16 ENCOUNTER — Encounter: Payer: Self-pay | Admitting: Gastroenterology

## 2019-02-16 ENCOUNTER — Ambulatory Visit (INDEPENDENT_AMBULATORY_CARE_PROVIDER_SITE_OTHER): Payer: Medicare Other | Admitting: Gastroenterology

## 2019-02-16 ENCOUNTER — Other Ambulatory Visit: Payer: Self-pay

## 2019-02-16 VITALS — BP 128/82 | HR 60 | Temp 98.0°F | Ht 66.0 in | Wt 210.4 lb

## 2019-02-16 DIAGNOSIS — K649 Unspecified hemorrhoids: Secondary | ICD-10-CM

## 2019-02-16 NOTE — Progress Notes (Signed)
IMPRESSION and PLAN:    #1. Chronic constipation (worse after back surgery July 2019) - better now with MiraLAX/Metamucil. #2. Rectal bleeding d/t hoids.  Failed extensive medical therapy including fiber supplements, sitz bath's, hydrocortisone cream, fluticasone, Cardizem cream.  Last colonoscopy 01/2018- small colonic polyp (TA) S/P polypectomy, moderate sigmoid diverticulosis, internal hemorrhoids.  Plan: -Continue Miralax 17g po qd. Can decrease dose if she has frequent bowel movements. -Continue Metamucil. -Increase water intake. -Continue sitz bath's twice a day. -fluticasone cream 0.05% generic 30g 1 bid PR x 10 days, 2 refill. -Appointment with Dr. Forestine Na [facility]) for possible EUA and hemorrhoidectomy.      HPI:    Chief Complaint:   Megan Galvan is a 68 y.o. female  For follow-up visit Having good bowel movements on MiraLAX Still having problems with hemorrhoids despite aggressive medical treatment with fiber, HC cream, fluticasone, sitz baths, Cardizem cream. This is affecting her lifestyle. She is mentally ready to get it "fixed"  No weight loss.  Accompanied by her husband.   Past Medical History:  Diagnosis Date  . Arthritis   . Bone spur of acromioclavicular joint, left   . GERD (gastroesophageal reflux disease)   . Hyperlipidemia   . Hypertension   . Plantar fasciitis     Current Outpatient Medications  Medication Sig Dispense Refill  . Acetaminophen 500 MG coapsule 1 capsule as needed.    . Azilsartan Medoxomil 40 MG TABS Take 2 tablets by mouth daily.    Marland Kitchen CALCIUM CITRATE-VITAMIN D PO Take 1 tablet by mouth daily.    . cetirizine (ZYRTEC) 10 MG tablet Take 1 tablet by mouth daily.    . chlorthalidone (HYGROTON) 25 MG tablet Take 1 tablet by mouth daily.    . fluticasone (FLONASE) 50 MCG/ACT nasal spray Place 1 spray into the nose as needed.    . lansoprazole (PREVACID) 30 MG capsule Take 1 capsule by mouth 2 (two) times daily.    .  Melatonin 5 MG TABS Take 1 tablet by mouth at bedtime.    . metFORMIN (GLUCOPHAGE) 1000 MG tablet Take 2 tablets by mouth.    . metoprolol succinate (TOPROL-XL) 50 MG 24 hr tablet Take 1 tablet by mouth daily.    . Multiple Vitamin (MULTI-VITAMINS) TABS Take 1 tablet by mouth daily.    . potassium chloride SA (K-DUR,KLOR-CON) 20 MEQ tablet Take 3 tablets by mouth daily.    . pravastatin (PRAVACHOL) 40 MG tablet Take 1 tablet by mouth at bedtime.    Marland Kitchen PROCTOZONE-HC 2.5 % rectal cream APPLY A THIN LAYER TO THE AFFECTED AREA RECTALLY TWICE DAILY  0  . ranitidine (ZANTAC) 300 MG tablet Take 1 tablet by mouth at bedtime.    . traZODone (DESYREL) 100 MG tablet Take 1 tablet by mouth at bedtime.     No current facility-administered medications for this visit.     Past Surgical History:  Procedure Laterality Date  . BACK SURGERY  11/17/2016   L3-L5  . BILATERAL CARPAL TUNNEL RELEASE    . bone spur Left    left shoulder  . bone spur Right    Right shoulder  . COLONOSCOPY  08/22/2011   Moderate predominantly sigmoid diverticulosis. Small internal hemorrhoids.   Marland Kitchen NECK SURGERY    . NECK SURGERY    . torn rotator cuff Right    Right shoulder  . torn rotator cuff Left    left shoulder  . TRIGGER FINGER RELEASE Left  thumb  . TRIGGER FINGER RELEASE Right    thumb and middle finger  . WRIST SURGERY Left    torn ligament  . WRIST SURGERY Right    cyst    Family History  Problem Relation Age of Onset  . Colon cancer Paternal Grandmother     Social History   Tobacco Use  . Smoking status: Never Smoker  . Smokeless tobacco: Never Used  Substance Use Topics  . Alcohol use: Never    Frequency: Never  . Drug use: Never    Allergies  Allergen Reactions  . Doxycycline     Rash   . Latex     Burn and itch     Review of Systems: All systems reviewed and negative except where noted in HPI.    Physical Exam:     BP 128/72   Pulse 75   Ht 5\' 6"  (1.676 m)   Wt 210 lb 4  oz (95.4 kg)   BMI 33.94 kg/m  GENERAL:  Alert, oriented, cooperative, not in acute distress. PSYCH: :Pleasant, normal mood and affect. ABDOMEN: Inspection: No visible peristalsis, no abnormal pulsations, skin normal.  Palpation/percussion: Soft, nontender, nondistended, no rigidity, no abnormal dullness to percussion, no hepatosplenomegaly and no palpable abdominal masses.  Auscultation: Normal bowel sounds, no abdominal bruits. Rectal exam: Not examined today.  Previously examined -no external hemorrhoids, no obvious fissures, 2 channels of internal hemorrhoids.  Heme-negative brown stool. Pt's husband was present throughout the history taking and exam. Seen in presence of Brooks CMA      Jorgen Wolfinger,MD 12/11/2017, 3:31 PM   CC Cox, Elnita Maxwell, MD Dr Amalia Hailey

## 2019-02-16 NOTE — Patient Instructions (Signed)
If you are age 67 or older, your body mass index should be between 23-30. Your Body mass index is 33.96 kg/m. If this is out of the aforementioned range listed, please consider follow up with your Primary Care Provider.  If you are age 67 or younger, your body mass index should be between 19-25. Your Body mass index is 33.96 kg/m. If this is out of the aformentioned range listed, please consider follow up with your Primary Care Provider.   You have an appointment with Dr. Amalia Hailey on 02/17/19 at 3:15pm. Address: [address], [place], [place] [address] Phone: [phone]  Thank you,  Dr. Jackquline Denmark

## 2019-02-17 DIAGNOSIS — E119 Type 2 diabetes mellitus without complications: Secondary | ICD-10-CM | POA: Diagnosis not present

## 2019-02-17 DIAGNOSIS — K6289 Other specified diseases of anus and rectum: Secondary | ICD-10-CM | POA: Diagnosis not present

## 2019-02-17 DIAGNOSIS — I10 Essential (primary) hypertension: Secondary | ICD-10-CM | POA: Diagnosis not present

## 2019-02-17 DIAGNOSIS — K601 Chronic anal fissure: Secondary | ICD-10-CM | POA: Diagnosis not present

## 2019-02-22 DIAGNOSIS — Z01812 Encounter for preprocedural laboratory examination: Secondary | ICD-10-CM | POA: Diagnosis not present

## 2019-02-22 DIAGNOSIS — K601 Chronic anal fissure: Secondary | ICD-10-CM | POA: Diagnosis not present

## 2019-02-24 DIAGNOSIS — G4733 Obstructive sleep apnea (adult) (pediatric): Secondary | ICD-10-CM | POA: Diagnosis not present

## 2019-02-28 HISTORY — PX: HEMORROIDECTOMY: SUR656

## 2019-03-01 DIAGNOSIS — K648 Other hemorrhoids: Secondary | ICD-10-CM | POA: Diagnosis not present

## 2019-03-01 DIAGNOSIS — E1165 Type 2 diabetes mellitus with hyperglycemia: Secondary | ICD-10-CM | POA: Diagnosis not present

## 2019-03-01 DIAGNOSIS — R9431 Abnormal electrocardiogram [ECG] [EKG]: Secondary | ICD-10-CM | POA: Diagnosis not present

## 2019-03-01 DIAGNOSIS — K601 Chronic anal fissure: Secondary | ICD-10-CM | POA: Diagnosis not present

## 2019-03-01 DIAGNOSIS — K649 Unspecified hemorrhoids: Secondary | ICD-10-CM | POA: Diagnosis not present

## 2019-03-01 DIAGNOSIS — K644 Residual hemorrhoidal skin tags: Secondary | ICD-10-CM | POA: Diagnosis not present

## 2019-03-01 DIAGNOSIS — K641 Second degree hemorrhoids: Secondary | ICD-10-CM | POA: Diagnosis not present

## 2019-03-18 DIAGNOSIS — Z0000 Encounter for general adult medical examination without abnormal findings: Secondary | ICD-10-CM | POA: Diagnosis not present

## 2019-05-24 DIAGNOSIS — N952 Postmenopausal atrophic vaginitis: Secondary | ICD-10-CM | POA: Diagnosis not present

## 2019-05-24 DIAGNOSIS — R300 Dysuria: Secondary | ICD-10-CM | POA: Diagnosis not present

## 2019-06-01 ENCOUNTER — Other Ambulatory Visit: Payer: Self-pay | Admitting: Family Medicine

## 2019-06-01 ENCOUNTER — Other Ambulatory Visit: Payer: Self-pay

## 2019-06-01 MED ORDER — PREMARIN 0.625 MG/GM VA CREA: 1.0000 | TOPICAL_CREAM | Freq: Every day | VAGINAL | 12 refills | Status: DC

## 2019-06-01 NOTE — Telephone Encounter (Signed)
Megan Galvan called to report that the estrogen samples worked well but have not cleared her symptoms.  Do you want to send a prescription to [facility] in [place].

## 2019-06-01 NOTE — Telephone Encounter (Signed)
Sent Ann's Premarin cream. She may use it daily for another 2 weeks and then go to 3 x week.

## 2019-06-02 DIAGNOSIS — M85852 Other specified disorders of bone density and structure, left thigh: Secondary | ICD-10-CM | POA: Diagnosis not present

## 2019-06-02 DIAGNOSIS — Z1231 Encounter for screening mammogram for malignant neoplasm of breast: Secondary | ICD-10-CM | POA: Diagnosis not present

## 2019-06-02 DIAGNOSIS — N959 Unspecified menopausal and perimenopausal disorder: Secondary | ICD-10-CM | POA: Diagnosis not present

## 2019-06-02 NOTE — Telephone Encounter (Signed)
Patient aware that premarin cream was sent to pharmacy and directions were given.

## 2019-06-03 ENCOUNTER — Other Ambulatory Visit: Payer: Self-pay

## 2019-06-03 ENCOUNTER — Encounter: Payer: Self-pay | Admitting: Family Medicine

## 2019-06-03 ENCOUNTER — Ambulatory Visit (INDEPENDENT_AMBULATORY_CARE_PROVIDER_SITE_OTHER): Payer: Medicare Other | Admitting: Family Medicine

## 2019-06-03 VITALS — BP 124/70 | HR 64 | Temp 97.5°F | Resp 16 | Ht 65.0 in | Wt 211.2 lb

## 2019-06-03 DIAGNOSIS — M7918 Myalgia, other site: Secondary | ICD-10-CM

## 2019-06-03 DIAGNOSIS — K219 Gastro-esophageal reflux disease without esophagitis: Secondary | ICD-10-CM

## 2019-06-03 DIAGNOSIS — F5101 Primary insomnia: Secondary | ICD-10-CM

## 2019-06-03 DIAGNOSIS — E1142 Type 2 diabetes mellitus with diabetic polyneuropathy: Secondary | ICD-10-CM

## 2019-06-03 DIAGNOSIS — M791 Myalgia, unspecified site: Secondary | ICD-10-CM | POA: Diagnosis not present

## 2019-06-03 DIAGNOSIS — Z6835 Body mass index (BMI) 35.0-35.9, adult: Secondary | ICD-10-CM

## 2019-06-03 HISTORY — DX: Type 2 diabetes mellitus with diabetic polyneuropathy: E11.42

## 2019-06-03 HISTORY — DX: Primary insomnia: F51.01

## 2019-06-03 HISTORY — DX: Gastro-esophageal reflux disease without esophagitis: K21.9

## 2019-06-03 LAB — POCT UA - MICROALBUMIN: Microalbumin Ur, POC: 10 mg/L

## 2019-06-04 LAB — HGB A1C W/O EAG: Hgb A1c MFr Bld: 6.6 % — ABNORMAL HIGH (ref 4.8–5.6)

## 2019-06-04 LAB — CBC WITH DIFFERENTIAL/PLATELET
Basophils Absolute: 0.1 10*3/uL (ref 0.0–0.2)
Basos: 1 %
EOS (ABSOLUTE): 0.1 10*3/uL (ref 0.0–0.4)
Eos: 2 %
Hematocrit: 34.4 % (ref 34.0–46.6)
Hemoglobin: 11.8 g/dL (ref 11.1–15.9)
Immature Grans (Abs): 0 10*3/uL (ref 0.0–0.1)
Immature Granulocytes: 0 %
Lymphocytes Absolute: 2.6 10*3/uL (ref 0.7–3.1)
Lymphs: 39 %
MCH: 33.1 pg — ABNORMAL HIGH (ref 26.6–33.0)
MCHC: 34.3 g/dL (ref 31.5–35.7)
MCV: 96 fL (ref 79–97)
Monocytes Absolute: 0.7 10*3/uL (ref 0.1–0.9)
Monocytes: 10 %
Neutrophils Absolute: 3.2 10*3/uL (ref 1.4–7.0)
Neutrophils: 48 %
Platelets: 241 10*3/uL (ref 150–450)
RBC: 3.57 x10E6/uL — ABNORMAL LOW (ref 3.77–5.28)
RDW: 12.9 % (ref 11.7–15.4)
WBC: 6.6 10*3/uL (ref 3.4–10.8)

## 2019-06-04 LAB — COMPREHENSIVE METABOLIC PANEL
ALT: 28 IU/L (ref 0–32)
AST: 33 IU/L (ref 0–40)
Albumin/Globulin Ratio: 1.8 (ref 1.2–2.2)
Albumin: 4.4 g/dL (ref 3.8–4.8)
Alkaline Phosphatase: 62 IU/L (ref 39–117)
BUN/Creatinine Ratio: 20 (ref 12–28)
BUN: 18 mg/dL (ref 8–27)
Bilirubin Total: 0.5 mg/dL (ref 0.0–1.2)
CO2: 25 mmol/L (ref 20–29)
Calcium: 9.3 mg/dL (ref 8.7–10.3)
Chloride: 101 mmol/L (ref 96–106)
Creatinine, Ser: 0.88 mg/dL (ref 0.57–1.00)
GFR calc Af Amer: 78 mL/min/{1.73_m2} (ref >59)
GFR calc non Af Amer: 68 mL/min/{1.73_m2} (ref >59)
Globulin, Total: 2.4 g/dL (ref 1.5–4.5)
Glucose: 131 mg/dL — ABNORMAL HIGH (ref 65–99)
Potassium: 3.6 mmol/L (ref 3.5–5.2)
Sodium: 141 mmol/L (ref 134–144)
Total Protein: 6.8 g/dL (ref 6.0–8.5)

## 2019-06-04 LAB — LIPID PANEL W/O CHOL/HDL RATIO
Cholesterol, Total: 109 mg/dL (ref 100–199)
HDL: 39 mg/dL — ABNORMAL LOW (ref >39)
LDL Chol Calc (NIH): 50 mg/dL (ref 0–99)
Triglycerides: 107 mg/dL (ref 0–149)
VLDL Cholesterol Cal: 20 mg/dL (ref 5–40)

## 2019-06-04 LAB — CARDIOVASCULAR RISK ASSESSMENT

## 2019-06-07 DIAGNOSIS — M7918 Myalgia, other site: Secondary | ICD-10-CM | POA: Insufficient documentation

## 2019-06-07 DIAGNOSIS — M791 Myalgia, unspecified site: Secondary | ICD-10-CM | POA: Insufficient documentation

## 2019-06-07 DIAGNOSIS — Z6835 Body mass index (BMI) 35.0-35.9, adult: Secondary | ICD-10-CM | POA: Insufficient documentation

## 2019-06-07 DIAGNOSIS — I739 Peripheral vascular disease, unspecified: Secondary | ICD-10-CM | POA: Insufficient documentation

## 2019-06-07 DIAGNOSIS — E6609 Other obesity due to excess calories: Secondary | ICD-10-CM | POA: Insufficient documentation

## 2019-06-07 DIAGNOSIS — E66812 Obesity, class 2: Secondary | ICD-10-CM

## 2019-06-07 DIAGNOSIS — Z6834 Body mass index (BMI) 34.0-34.9, adult: Secondary | ICD-10-CM | POA: Insufficient documentation

## 2019-06-07 HISTORY — DX: Myalgia, unspecified site: M79.10

## 2019-06-07 HISTORY — DX: Obesity, class 2: E66.812

## 2019-06-07 NOTE — Assessment & Plan Note (Signed)
Well controlled. Continue current medicines. 

## 2019-06-07 NOTE — Assessment & Plan Note (Addendum)
Associated with diabetes and hyperlipidemia.  Work on diet and exercise.

## 2019-06-07 NOTE — Assessment & Plan Note (Signed)
Drink plenty of fluids. Check magnesium and phosphorus.

## 2019-06-07 NOTE — Progress Notes (Signed)
Established Patient Office Visit  Subjective:  Patient ID: Megan Galvan, female    DOB: 02/14/1951  Age: 69 y.o. MRN: [id number]  CC:  Chief Complaint  Patient presents with  . Diabetes  . Hyperlipidemia  . Hypertension  . Gastroesophageal Reflux    HPI Megan Galvan presents for Diabetes She presents for her follow-up diabetic visit. She has type 2 diabetes mellitus. Her disease course has been stable. Pertinent negatives for hypoglycemia include no dizziness, headaches or nervousness/anxiousness. Pertinent negatives for diabetes include no chest pain, no polydipsia, no polyphagia and no polyuria. There are no hypoglycemic complications. Diabetic complications include peripheral neuropathy. Risk factors for coronary artery disease include dyslipidemia. Current diabetic treatment includes diet and oral agent (monotherapy) (Metformin). She is compliant with treatment all of the time. She is following a diabetic diet. She rarely participates in exercise. Her breakfast blood glucose range is generally 130-140 mg/dl. An ACE inhibitor/angiotensin II receptor blocker is being taken. Eye exam is current (10/2018).  Hyperlipidemia This is a chronic problem. The current episode started more than 1 year ago. The problem is controlled. Recent lipid tests were reviewed and are normal. There are no known factors aggravating her hyperlipidemia. Associated symptoms include myalgias. Pertinent negatives include no chest pain or shortness of breath. Current antihyperlipidemic treatment includes statins (pravastatin). Compliance problems include adherence to exercise.   Hypertension This is a chronic problem. The current episode started more than 1 year ago. The problem is controlled. Pertinent negatives include no chest pain, headaches or shortness of breath. Past treatments include angiotensin blockers and beta blockers. Compliance problems include exercise.   Gastroesophageal Reflux She reports no  abdominal pain, no chest pain, no coughing, no nausea or no sore throat. This is a chronic problem. She has tried a PPI and a histamine-2 antagonist for the symptoms. The treatment provided significant relief.    Past Medical History:  Diagnosis Date  . Anemia   . Arthritis   . Bone spur of acromioclavicular joint, left   . Cataract   . Diabetes mellitus without complication ([place])   . GERD (gastroesophageal reflux disease)   . Heart murmur   . Hyperlipidemia   . Hypertension   . Nonrheumatic aortic (valve) stenosis   . Plantar fasciitis   . Sleep apnea    cpap    Past Surgical History:  Procedure Laterality Date  . BACK SURGERY  11/17/2016   L3-L5  . BILATERAL CARPAL TUNNEL RELEASE    . bone spur Left    left shoulder  . bone spur Right    Right shoulder  . COLONOSCOPY  08/22/2011   Moderate predominantly sigmoid diverticulosis. Small internal hemorrhoids.   Marland Kitchen NECK SURGERY    . NECK SURGERY    . torn rotator cuff Right    Right shoulder  . torn rotator cuff Left    left shoulder  . TRIGGER FINGER RELEASE Left    thumb  . TRIGGER FINGER RELEASE Right    thumb and middle finger  . WRIST SURGERY Left    torn ligament  . WRIST SURGERY Right    cyst    Family History  Problem Relation Age of Onset  . Colon cancer Paternal Grandmother   . Esophageal cancer Neg Hx   . Rectal cancer Neg Hx   . Stomach cancer Neg Hx     Social History   Socioeconomic History  . Marital status: Married    Spouse name: Not on file  .  Number of children: 2  . Years of education: Not on file  . Highest education level: Not on file  Occupational History  . Not on file  Tobacco Use  . Smoking status: Never Smoker  . Smokeless tobacco: Never Used  Substance and Sexual Activity  . Alcohol use: Never  . Drug use: Never  . Sexual activity: Not on file  Other Topics Concern  . Not on file  Social History Narrative  . Not on file   Social Determinants of Health   Financial  Resource Strain:   . Difficulty of Paying Living Expenses: Not on file  Food Insecurity:   . Worried About Charity fundraiser in the Last Year: Not on file  . Ran Out of Food in the Last Year: Not on file  Transportation Needs:   . Lack of Transportation (Medical): Not on file  . Lack of Transportation (Non-Medical): Not on file  Physical Activity:   . Days of Exercise per Week: Not on file  . Minutes of Exercise per Session: Not on file  Stress:   . Feeling of Stress : Not on file  Social Connections:   . Frequency of Communication with Friends and Family: Not on file  . Frequency of Social Gatherings with Friends and Family: Not on file  . Attends Religious Services: Not on file  . Active Member of Clubs or Organizations: Not on file  . Attends Archivist Meetings: Not on file  . Marital Status: Not on file  Intimate Partner Violence:   . Fear of Current or Ex-Partner: Not on file  . Emotionally Abused: Not on file  . Physically Abused: Not on file  . Sexually Abused: Not on file    Outpatient Medications Prior to Visit  Medication Sig Dispense Refill  . Acetaminophen 500 MG coapsule 1 capsule as needed.    . AMBULATORY NON FORMULARY MEDICATION Nifedipine 5%    . aspirin EC 81 MG tablet Take 81 mg by mouth daily.    Marland Kitchen CALCIUM CITRATE-VITAMIN D PO Take 1 tablet by mouth daily.    . cetirizine (ZYRTEC) 10 MG tablet Take 1 tablet by mouth daily.    . chlorthalidone (HYGROTON) 25 MG tablet Take 1 tablet by mouth daily.    Marland Kitchen conjugated estrogens (PREMARIN) vaginal cream Place 1 Applicatorful vaginally daily. 42.5 g 12  . famotidine (PEPCID) 40 MG tablet Take 40 mg by mouth daily.    . fluticasone (FLONASE) 50 MCG/ACT nasal spray Place 1 spray into the nose as needed.    . Melatonin 5 MG TABS Take 1 tablet by mouth at bedtime.    . metFORMIN (GLUCOPHAGE) 1000 MG tablet Take 1 tablet by mouth daily.     . metoprolol succinate (TOPROL-XL) 50 MG 24 hr tablet Take 1 tablet  by mouth daily.    . Multiple Vitamin (MULTI-VITAMINS) TABS Take 1 tablet by mouth daily.    Marland Kitchen omeprazole (PRILOSEC) 40 MG capsule Take 40 mg by mouth daily.    . potassium chloride SA (K-DUR,KLOR-CON) 20 MEQ tablet Take 3 tablets by mouth daily.    . pravastatin (PRAVACHOL) 40 MG tablet Take 1 tablet by mouth at bedtime.    . traZODone (DESYREL) 100 MG tablet Take 1 tablet by mouth at bedtime.    . valsartan (DIOVAN) 320 MG tablet Take 320 mg by mouth daily.     Facility-Administered Medications Prior to Visit  Medication Dose Route Frequency Provider Last Rate Last Admin  .  0.9 %  sodium chloride infusion  500 mL Intravenous Once Jackquline Denmark, MD        Allergies  Allergen Reactions  . Ace Inhibitors Cough  . Doxycycline     Rash   . Keflex [Cephalexin]   . Latex     Burn and itch  . Nexium [Esomeprazole Magnesium] Diarrhea  . Protonix [Pantoprazole Sodium] Other (See Comments)    Constipation    ROS Review of Systems  Constitutional: Negative for chills and fever.  HENT: Negative for congestion, ear pain and sore throat.   Respiratory: Negative for cough and shortness of breath.   Cardiovascular: Negative for chest pain.  Gastrointestinal: Negative for abdominal pain, constipation, diarrhea, nausea and vomiting.  Endocrine: Negative for polydipsia, polyphagia and polyuria.  Genitourinary: Positive for vaginal pain. Negative for dysuria and urgency.       Vaginal irritation. Premarin helped, but rx was $140. For a month. She cannot afford this.   Musculoskeletal: Positive for myalgias. Negative for arthralgias.       Cramps in her legs.   Neurological: Negative for dizziness and headaches.  Psychiatric/Behavioral: Negative for dysphoric mood. The patient is not nervous/anxious.       Objective:    Physical Exam  Constitutional: She is oriented to person, place, and time. She appears well-developed and well-nourished.  Cardiovascular: Normal rate, regular rhythm,  normal heart sounds and normal pulses.  No murmur heard. Pulmonary/Chest: Effort normal and breath sounds normal. No respiratory distress. She has no wheezes. She has no rales.  Abdominal: Soft. Bowel sounds are normal. There is no abdominal tenderness.  Musculoskeletal:        General: No edema. Normal range of motion.  Neurological: She is alert and oriented to person, place, and time.  Psychiatric: She has a normal mood and affect.    BP 124/70 (BP Location: Left Arm, Patient Position: Sitting, Cuff Size: Normal)   Pulse 64   Temp (!) 97.5 F (36.4 C)   Resp 16   Ht 5\' 5"  (1.651 m)   Wt 211 lb 3.2 oz (95.8 kg)   BMI 35.15 kg/m  Wt Readings from Last 3 Encounters:  06/03/19 211 lb 3.2 oz (95.8 kg)  02/16/19 210 lb 6 oz (95.4 kg)  12/21/18 208 lb 2 oz (94.4 kg)     Health Maintenance Due  Topic Date Due  . Hepatitis C Screening  Apr 11, 1951  . FOOT EXAM  05/28/1961  . OPHTHALMOLOGY EXAM  05/28/1961  . MAMMOGRAM  05/28/2001  . DEXA SCAN  05/28/2016  . PNA vac Low Risk Adult (2 of 2 - PPSV23) 04/13/2017    There are no preventive care reminders to display for this patient.  No results found for: TSH Lab Results  Component Value Date   WBC 6.6 06/03/2019   HGB 11.8 06/03/2019   HCT 34.4 06/03/2019   MCV 96 06/03/2019   PLT 241 06/03/2019   Lab Results  Component Value Date   NA 141 06/03/2019   K 3.6 06/03/2019   CO2 25 06/03/2019   GLUCOSE 131 (H) 06/03/2019   BUN 18 06/03/2019   CREATININE 0.88 06/03/2019   BILITOT 0.5 06/03/2019   ALKPHOS 62 06/03/2019   AST 33 06/03/2019   ALT 28 06/03/2019   PROT 6.8 06/03/2019   ALBUMIN 4.4 06/03/2019   CALCIUM 9.3 06/03/2019   Lab Results  Component Value Date   CHOL 109 06/03/2019   Lab Results  Component Value Date   HDL 39 (  L) 06/03/2019   Lab Results  Component Value Date   LDLCALC 50 06/03/2019   Lab Results  Component Value Date   TRIG 107 06/03/2019   No results found for: CHOLHDL Lab Results   Component Value Date   HGBA1C 6.6 (H) 06/03/2019      Assessment & Plan:   Problem List Items Addressed This Visit      Digestive   Gastro-esophageal reflux disease without esophagitis   Relevant Orders   Comprehensive metabolic panel (Completed)   CBC with Differential/Platelet (Completed)   Hgb A1c w/o eAG (Completed)   Lipid Panel w/o Chol/HDL Ratio (Completed)     Endocrine   Type 2 diabetes mellitus with diabetic polyneuropathy ([place]) - Primary   Relevant Orders   POCT UA - Microalbumin (Completed)   Comprehensive metabolic panel (Completed)   CBC with Differential/Platelet (Completed)   Hgb A1c w/o eAG (Completed)   Lipid Panel w/o Chol/HDL Ratio (Completed)     Other   Primary insomnia    Other Visit Diagnoses    Myalgia       Relevant Orders   Magnesium   Phosphorus      No orders of the defined types were placed in this encounter.   Follow-up: No follow-ups on file.    Rochel Brome, MD

## 2019-06-07 NOTE — Assessment & Plan Note (Signed)
Well controlled. Continue current medicines. Labs drawn. Work on diet and exercise.

## 2019-06-09 ENCOUNTER — Encounter: Payer: Self-pay | Admitting: Family Medicine

## 2019-06-11 LAB — SPECIMEN STATUS REPORT

## 2019-06-11 LAB — MAGNESIUM: Magnesium: 1.7 mg/dL (ref 1.6–2.3)

## 2019-06-11 LAB — PHOSPHORUS: Phosphorus: 3.9 mg/dL (ref 3.0–4.3)

## 2019-06-18 ENCOUNTER — Other Ambulatory Visit: Payer: Self-pay | Admitting: Family Medicine

## 2019-06-21 DIAGNOSIS — N760 Acute vaginitis: Secondary | ICD-10-CM | POA: Diagnosis not present

## 2019-07-06 ENCOUNTER — Other Ambulatory Visit: Payer: Self-pay | Admitting: Family Medicine

## 2019-07-13 DIAGNOSIS — G4733 Obstructive sleep apnea (adult) (pediatric): Secondary | ICD-10-CM | POA: Diagnosis not present

## 2019-07-19 DIAGNOSIS — N760 Acute vaginitis: Secondary | ICD-10-CM | POA: Diagnosis not present

## 2019-07-20 ENCOUNTER — Ambulatory Visit (INDEPENDENT_AMBULATORY_CARE_PROVIDER_SITE_OTHER): Payer: Medicare Other | Admitting: Nurse Practitioner

## 2019-07-20 ENCOUNTER — Other Ambulatory Visit: Payer: Self-pay

## 2019-07-20 ENCOUNTER — Encounter: Payer: Self-pay | Admitting: Nurse Practitioner

## 2019-07-20 VITALS — BP 138/72 | HR 72 | Temp 96.2°F | Ht 65.0 in | Wt 212.0 lb

## 2019-07-20 DIAGNOSIS — R42 Dizziness and giddiness: Secondary | ICD-10-CM | POA: Insufficient documentation

## 2019-07-20 HISTORY — DX: Dizziness and giddiness: R42

## 2019-07-20 MED ORDER — MECLIZINE HCL 12.5 MG PO TABS: 12.5000 mg | ORAL_TABLET | Freq: Two times a day (BID) | ORAL | 0 refills | Status: AC

## 2019-07-20 NOTE — Patient Instructions (Addendum)
Dizziness Dizziness is a common problem. It makes you feel unsteady or light-headed. You may feel like you are about to pass out (faint). Dizziness can lead to getting hurt if you stumble or fall. Dizziness can be caused by many things, including:  Medicines.  Not having enough water in your body (dehydration).  Illness. Follow these instructions at home: Eating and drinking   Drink enough fluid to keep your pee (urine) clear or pale yellow. This helps to keep you from getting dehydrated. Try to drink more clear fluids, such as water.  Do not drink alcohol.  Limit how much caffeine you drink or eat, if your doctor tells you to do that.  Limit how much salt (sodium) you drink or eat, if your doctor tells you to do that. Activity   Avoid making quick movements. ? When you stand up from sitting in a chair, steady yourself until you feel okay. ? In the morning, first sit up on the side of the bed. When you feel okay, stand slowly while you hold onto something. Do this until you know that your balance is fine.  If you need to stand in one place for a long time, move your legs often. Tighten and relax the muscles in your legs while you are standing.  Do not drive or use heavy machinery if you feel dizzy.  Avoid bending down if you feel dizzy. Place items in your home so you can reach them easily without leaning over. Lifestyle  Do not use any products that contain nicotine or tobacco, such as cigarettes and e-cigarettes. If you need help quitting, ask your doctor.  Try to lower your stress level. You can do this by using methods such as yoga or meditation. Talk with your doctor if you need help. General instructions  Watch your dizziness for any changes.  Take over-the-counter and prescription medicines only as told by your doctor. Talk with your doctor if you think that you are dizzy because of a medicine that you are taking.  Tell a friend or a family member that you are feeling  dizzy. If he or she notices any changes in your behavior, have this person call your doctor.  Keep all follow-up visits as told by your doctor. This is important. Contact a doctor if:  Your dizziness does not go away.  Your dizziness or light-headedness gets worse.  You feel sick to your stomach (nauseous).  You have trouble hearing.  You have new symptoms.  You are unsteady on your feet.  You feel like the room is spinning. Get help right away if:  You throw up (vomit) or have watery poop (diarrhea), and you cannot eat or drink anything.  You have trouble: ? Talking. ? Walking. ? Swallowing. ? Using your arms, hands, or legs.  You feel generally weak.  You are not thinking clearly, or you have trouble forming sentences. A friend or family member may notice this.  You have: ? Chest pain. ? Pain in your belly (abdomen). ? Shortness of breath. ? Sweating.  Your vision changes.  You are bleeding.  You have a very bad headache.  You have neck pain or a stiff neck.  You have a fever. These symptoms may be an emergency. Do not wait to see if the symptoms will go away. Get medical help right away. Call your local emergency services (911 in the [place] Do not drive yourself to the hospital. Summary  Dizziness makes you feel unsteady or light-headed. You   may feel like you are about to pass out (faint).  Drink enough fluid to keep your pee (urine) clear or pale yellow. Do not drink alcohol.  Avoid making quick movements if you feel dizzy.  Watch your dizziness for any changes. This information is not intended to replace advice given to you by your health care provider. Make sure you discuss any questions you have with your health care provider. Document Revised: 04/18/2017 Document Reviewed: 05/02/2016 Elsevier Patient Education  2020 [employer] Inc[employer]  Vertigo Vertigo is the feeling that you or the things around you are moving when they are not. This feeling can come  and go at any time. Vertigo often goes away on its own. This condition can be dangerous if it happens when you are doing activities like driving or working with machines. Your doctor will do tests to find the cause of your vertigo. These tests will also help your doctor decide on the best treatment for you. Follow these instructions at home: Eating and drinking      Drink enough fluid to keep your pee (urine) pale yellow.  Do not drink alcohol. Activity  Return to your normal activities as told by your doctor. Ask your doctor what activities are safe for you.  In the morning, first sit up on the side of the bed. When you feel okay, stand slowly while you hold onto something until you know that your balance is fine.  Move slowly. Avoid sudden body or head movements or certain positions, as told by your doctor.  Use a cane if you have trouble standing or walking.  Sit down right away if you feel dizzy.  Avoid doing any tasks or activities that can cause danger to you or others if you get dizzy.  Avoid bending down if you feel dizzy. Place items in your home so that they are easy for you to reach without leaning over.  Do not drive or use heavy machinery if you feel dizzy. General instructions  Take over-the-counter and prescription medicines only as told by your doctor.  Keep all follow-up visits as told by your doctor. This is important. Contact a doctor if:  Your medicine does not help your vertigo.  You have a fever.  Your problems get worse or you have new symptoms.  Your family or friends see changes in your behavior.  The feeling of being sick to your stomach gets worse.  Your vomiting gets worse.  You lose feeling (have numbness) in part of your body.  You feel prickling and tingling in a part of your body. Get help right away if:  You have trouble moving or talking.  You are always dizzy.  You pass out (faint).  You get very bad headaches.  You feel  weak in your hands, arms, or legs.  You have changes in your hearing.  You have changes in how you see (vision).  You get a stiff neck.  Bright light starts to bother you. Summary  Vertigo is the feeling that you or the things around you are moving when they are not.  Your doctor will do tests to find the cause of your vertigo.  You may be told to avoid some tasks, positions, or movements.  Contact a doctor if your medicine is not helping, or if you have a fever, new symptoms, or a change in behavior.  Get help right away if you get very bad headaches, or if you have changes in how you speak, hear, or   see. This information is not intended to replace advice given to you by your health care provider. Make sure you discuss any questions you have with your health care provider. Document Revised: 03/09/2018 Document Reviewed: 03/09/2018 Elsevier Patient Education  2020 Elsevier Inc.  

## 2019-07-20 NOTE — Progress Notes (Signed)
Acute Office Visit  Subjective:    Patient ID: Megan Galvan, female    DOB: November 12, 1950, 69 y.o.   MRN: [id number]  Chief Complaint  Patient presents with  . Dizziness    X3 WEEKS WHEN LAYING DOWN AND GETTING UP.  Patient  is a  69 years old female. She is here for virtigo  The patient's medications were reviewed and reconciled since the patient's last visit.  History details were provided by the patient. The history appears to be reliable.    Vertigo Patient presents for evaluation of dizziness. The symptoms started 3 days ago  and have progressively worse. The attacks occurs frequently and last for a few seconds.   Positions that worsen symptoms: standing from lying and turning head from side to side. No previous work up has been done, this is a new problem. patient has not changed or started any new medication, no recent infection, no head trauma, noise exposure or family history   Past Medical History:  Diagnosis Date  . Anemia   . Arthritis   . Bone spur of acromioclavicular joint, left   . Cataract   . Diabetes mellitus without complication ([place])   . GERD (gastroesophageal reflux disease)   . Heart murmur   . Hyperlipidemia   . Hypertension   . Nonrheumatic aortic (valve) stenosis   . Plantar fasciitis   . Sleep apnea    cpap    Past Surgical History:  Procedure Laterality Date  . BACK SURGERY  11/17/2016   L3-L5  . BILATERAL CARPAL TUNNEL RELEASE    . bone spur Left    left shoulder  . bone spur Right    Right shoulder  . COLONOSCOPY  08/22/2011   Moderate predominantly sigmoid diverticulosis. Small internal hemorrhoids.   Marland Kitchen NECK SURGERY    . NECK SURGERY    . torn rotator cuff Right    Right shoulder  . torn rotator cuff Left    left shoulder  . TRIGGER FINGER RELEASE Left    thumb  . TRIGGER FINGER RELEASE Right    thumb and middle finger  . WRIST SURGERY Left    torn ligament  . WRIST SURGERY Right    cyst    Family History  Problem Relation  Age of Onset  . Colon cancer Paternal Grandmother   . Esophageal cancer Neg Hx   . Rectal cancer Neg Hx   . Stomach cancer Neg Hx     Social History   Socioeconomic History  . Marital status: Married    Spouse name: Not on file  . Number of children: 2  . Years of education: Not on file  . Highest education level: Not on file  Occupational History  . Not on file  Tobacco Use  . Smoking status: Never Smoker  . Smokeless tobacco: Never Used  Substance and Sexual Activity  . Alcohol use: Never  . Drug use: Never  . Sexual activity: Not on file  Other Topics Concern  . Not on file  Social History Narrative  . Not on file   Social Determinants of Health   Financial Resource Strain:   . Difficulty of Paying Living Expenses:   Food Insecurity:   . Worried About Charity fundraiser in the Last Year:   . Arboriculturist in the Last Year:   Transportation Needs:   . Film/video editor (Medical):   Marland Kitchen Lack of Transportation (Non-Medical):   Physical Activity:   .  Days of Exercise per Week:   . Minutes of Exercise per Session:   Stress:   . Feeling of Stress :   Social Connections:   . Frequency of Communication with Friends and Family:   . Frequency of Social Gatherings with Friends and Family:   . Attends Religious Services:   . Active Member of Clubs or Organizations:   . Attends Archivist Meetings:   Marland Kitchen Marital Status:   Intimate Partner Violence:   . Fear of Current or Ex-Partner:   . Emotionally Abused:   Marland Kitchen Physically Abused:   . Sexually Abused:     Outpatient Medications Prior to Visit  Medication Sig Dispense Refill  . ACCU-CHEK AVIVA PLUS test strip CHECK BLOOD SUGAR ONCE  DAILY 100 strip 3  . Acetaminophen 500 MG coapsule 1 capsule as needed.    . AMBULATORY NON FORMULARY MEDICATION Nifedipine 5%    . ANUSOL-HC 2.5 % rectal cream SMARTSIG:1 Sparingly Topical Twice Daily    . aspirin EC 81 MG tablet Take 81 mg by mouth daily.    . calcium  citrate-vitamin D (CITRACAL+D) 315-200 MG-UNIT tablet     . cetirizine (ZYRTEC) 10 MG tablet Take 1 tablet by mouth daily.    . chlorthalidone (HYGROTON) 25 MG tablet Take 1 tablet by mouth daily.    Marland Kitchen conjugated estrogens (PREMARIN) vaginal cream Place 1 Applicatorful vaginally daily. 42.5 g 12  . famotidine (PEPCID) 40 MG tablet TAKE 1 TABLET BY MOUTH AT  BEDTIME 90 tablet 3  . fluconazole (DIFLUCAN) 150 MG tablet     . fluticasone (FLONASE) 50 MCG/ACT nasal spray Place 1 spray into the nose as needed.    . Melatonin 5 MG CAPS     . metFORMIN (GLUCOPHAGE) 1000 MG tablet Take 1 tablet by mouth daily.     . metoprolol succinate (TOPROL-XL) 50 MG 24 hr tablet Take 1 tablet by mouth daily.    . metroNIDAZOLE (FLAGYL) 500 MG tablet Take 500 mg by mouth 2 (two) times daily.    . Multiple Vitamin (MULTI-VITAMINS) TABS Take 1 tablet by mouth daily.    Marland Kitchen omeprazole (PRILOSEC) 40 MG capsule Take 40 mg by mouth daily.    . potassium chloride SA (K-DUR,KLOR-CON) 20 MEQ tablet Take 3 tablets by mouth daily.    . pravastatin (PRAVACHOL) 40 MG tablet Take 1 tablet by mouth at bedtime.    Marland Kitchen PREVACID 30 MG capsule     . traZODone (DESYREL) 100 MG tablet Take 1 tablet by mouth at bedtime.    . valsartan (DIOVAN) 320 MG tablet Take 320 mg by mouth daily.    Marland Kitchen CALCIUM CITRATE-VITAMIN D PO Take 1 tablet by mouth daily.    . Melatonin 5 MG TABS Take 1 tablet by mouth at bedtime.     Facility-Administered Medications Prior to Visit  Medication Dose Route Frequency Provider Last Rate Last Admin  . 0.9 %  sodium chloride infusion  500 mL Intravenous Once Jackquline Denmark, MD        Allergies  Allergen Reactions  . Ace Inhibitors Cough  . Doxycycline     Rash   . Keflex [Cephalexin]   . Latex     Burn and itch  . Nexium [Esomeprazole Magnesium] Diarrhea  . Protonix [Pantoprazole Sodium] Other (See Comments)    Constipation    Review of Systems  Constitutional: Negative for activity change, appetite  change and fatigue.  HENT: Negative for congestion and ear pain.   Eyes:  Negative for pain.  Respiratory: Negative for cough, chest tightness and shortness of breath.   Cardiovascular: Negative for chest pain and palpitations.  Gastrointestinal: Negative for abdominal distention and abdominal pain.  Genitourinary: Negative for difficulty urinating.  Musculoskeletal: Negative for arthralgias and myalgias.  Skin: Negative for rash.  Neurological: Positive for dizziness. Negative for facial asymmetry, speech difficulty and headaches.  Psychiatric/Behavioral: The patient is not nervous/anxious.        Objective:    Physical Exam Constitutional:      Appearance: Normal appearance.  HENT:     Head: Normocephalic.     Right Ear: External ear normal.     Left Ear: External ear normal.     Nose: Nose normal. No congestion.     Mouth/Throat:     Mouth: Mucous membranes are moist.  Eyes:     Conjunctiva/sclera: Conjunctivae normal.  Cardiovascular:     Rate and Rhythm: Normal rate and regular rhythm.     Pulses: Normal pulses.  Pulmonary:     Effort: Pulmonary effort is normal.     Breath sounds: Normal breath sounds.  Chest:     Chest wall: No tenderness.  Abdominal:     General: Bowel sounds are normal.     Tenderness: There is no abdominal tenderness.  Musculoskeletal:     Cervical back: Normal range of motion.  Skin:    General: Skin is warm.  Neurological:     Mental Status: She is alert and oriented to person, place, and time.     Motor: No weakness.  Psychiatric:        Mood and Affect: Mood normal.        Behavior: Behavior normal.     BP 138/72 (BP Location: Left Arm, Patient Position: Sitting)   Pulse 72   Temp (!) 96.2 F (35.7 C) (Temporal)   Ht 5\' 5"  (1.651 m)   Wt 212 lb (96.2 kg)   SpO2 98%   BMI 35.28 kg/m  Wt Readings from Last 3 Encounters:  07/20/19 212 lb (96.2 kg)  06/03/19 211 lb 3.2 oz (95.8 kg)  02/16/19 210 lb 6 oz (95.4 kg)    Health  Maintenance Due  Topic Date Due  . Hepatitis C Screening  Never done  . FOOT EXAM  Never done  . OPHTHALMOLOGY EXAM  Never done  . PNA vac Low Risk Adult (2 of 2 - PPSV23) 04/13/2017    There are no preventive care reminders to display for this patient.   No results found for: TSH Lab Results  Component Value Date   WBC 6.6 06/03/2019   HGB 11.8 06/03/2019   HCT 34.4 06/03/2019   MCV 96 06/03/2019   PLT 241 06/03/2019   Lab Results  Component Value Date   NA 141 06/03/2019   K 3.6 06/03/2019   CO2 25 06/03/2019   GLUCOSE 131 (H) 06/03/2019   BUN 18 06/03/2019   CREATININE 0.88 06/03/2019   BILITOT 0.5 06/03/2019   ALKPHOS 62 06/03/2019   AST 33 06/03/2019   ALT 28 06/03/2019   PROT 6.8 06/03/2019   ALBUMIN 4.4 06/03/2019   CALCIUM 9.3 06/03/2019   Lab Results  Component Value Date   CHOL 109 06/03/2019   Lab Results  Component Value Date   HDL 39 (L) 06/03/2019   Lab Results  Component Value Date   LDLCALC 50 06/03/2019   Lab Results  Component Value Date   TRIG 107 06/03/2019   No results found  for: CHOLHDL Lab Results  Component Value Date   HGBA1C 6.6 (H) 06/03/2019       Assessment & Plan:  Vertigo Patient not well controlled.  provided education. Meclizine 12.5 mg started for vertigo. Pt knows to follow up with unresolved or worsening symptoms.  Problem List Items Addressed This Visit      Other   Vertigo - Primary    Patient not well controlled.  provided education. Meclizine 12.5 mg started for vertigo. Pt knows to follow up with unresolved or worsening symptoms.          Meds ordered this encounter  Medications  . meclizine (ANTIVERT) 12.5 MG tablet    Sig: Take 1 tablet (12.5 mg total) by mouth 2 (two) times daily for 10 days.    Dispense:  20 tablet    Refill:  0    Order Specific Question:   Supervising Provider    AnswerShelton Silvas     Ivy Lynn, NP

## 2019-07-26 DIAGNOSIS — S82891A Other fracture of right lower leg, initial encounter for closed fracture: Secondary | ICD-10-CM | POA: Diagnosis not present

## 2019-07-29 DIAGNOSIS — S82831A Other fracture of upper and lower end of right fibula, initial encounter for closed fracture: Secondary | ICD-10-CM | POA: Diagnosis not present

## 2019-07-30 DIAGNOSIS — G4733 Obstructive sleep apnea (adult) (pediatric): Secondary | ICD-10-CM | POA: Diagnosis not present

## 2019-08-05 ENCOUNTER — Telehealth: Payer: Self-pay | Admitting: Family Medicine

## 2019-08-05 NOTE — Progress Notes (Signed)
  Chronic Care Management   Note  08/05/2019 Name: Megan Galvan MRN: [id number] DOB: Nov 21, 1950  Megan Galvan is a 69 y.o. year old female who is a primary care patient of Cox, Kirsten, MD. I reached out to [name] by phone today in response to a referral sent by [name] PCP, Cox, Kirsten, MD.   [name] was given information about Chronic Care Management services today including:  1. CCM service includes personalized support from designated clinical staff supervised by her physician, including individualized plan of care and coordination with other care providers 2. 24/7 contact phone numbers for assistance for urgent and routine care needs. 3. Service will only be billed when office clinical staff spend 20 minutes or more in a month to coordinate care. 4. Only one practitioner may furnish and bill the service in a calendar month. 5. The patient may stop CCM services at any time (effective at the end of the month) by phone call to the office staff.   Patient agreed to services and verbal consent obtained.   Follow up plan:   Earney Hamburg Upstream Scheduler

## 2019-08-06 NOTE — Assessment & Plan Note (Signed)
Patient not well controlled.  provided education. Meclizine 12.5 mg started for vertigo. Pt knows to follow up with unresolved or worsening symptoms.

## 2019-08-16 ENCOUNTER — Other Ambulatory Visit: Payer: Self-pay | Admitting: Family Medicine

## 2019-08-17 NOTE — Chronic Care Management (AMB) (Deleted)
Chronic Care Management Pharmacy  Name: Megan Galvan  MRN: [id number] DOB: [birth year]  Chief Complaint/ HPI  Megan Galvan,  69 y.o. , female presents for their Initial CCM visit with the clinical pharmacist via telephone due to COVID-19 Pandemic.  PCP : Rochel Brome, MD  Their chronic conditions include: GERD, DM, Insomnia, Vertigo, HTN, Arthritis, HLD.  Office Visits: 07/20/2019 - Vertigo - ordered meclizine 12.5 mg bid. 06/03/2019 - no med changes. Encouraged diet and exercise.  05/24/2019 - Postmenopausal atrophic vaginitis symptoms of itching and acute cystitis. Premarin vaginal cream started.  03/18/2019 - Medicare AWV  Consult Visit: 06/02/2019 - Dexa scan revealed osteopenia. Negative Mammogram 03/01/2019 - Dr. Amalia Hailey procedure for hemorrhoids. Oxycodone for post-op pain. 02/22/2019 - COVID test for procedure. 02/17/2019 - Pre-op consult with Dr. Amalia Hailey 02/16/2019 - Dr. Lyndel Safe visit for hemorrhoids and chronic constipation. Continue metamucil and miralax along with sitz bath and hydration. Referred to Dr. Amalia Hailey for evaluation of hemorrhoid removal. Medications: Outpatient Encounter Medications as of 08/18/2019  Medication Sig Note  . ACCU-CHEK AVIVA PLUS test strip CHECK BLOOD SUGAR ONCE  DAILY   . Acetaminophen 500 MG coapsule 1 capsule as needed.   . AMBULATORY NON FORMULARY MEDICATION Nifedipine 5%   . ANUSOL-HC 2.5 % rectal cream SMARTSIG:1 Sparingly Topical Twice Daily   . aspirin EC 81 MG tablet Take 81 mg by mouth daily.   . calcium citrate-vitamin D (CITRACAL+D) 315-200 MG-UNIT tablet    . cetirizine (ZYRTEC) 10 MG tablet Take 1 tablet by mouth daily.   . chlorthalidone (HYGROTON) 25 MG tablet Take 1 tablet by mouth daily.   Marland Kitchen conjugated estrogens (PREMARIN) vaginal cream Place 1 Applicatorful vaginally daily.   . famotidine (PEPCID) 40 MG tablet TAKE 1 TABLET BY MOUTH AT  BEDTIME   . fluconazole (DIFLUCAN) 150 MG tablet    . fluticasone (FLONASE) 50 MCG/ACT  nasal spray Place 1 spray into the nose as needed. 12/11/2017: 1 spray in each   . Melatonin 5 MG CAPS    . metFORMIN (GLUCOPHAGE) 1000 MG tablet Take 1 tablet by mouth daily.  12/11/2017: At night   . metoprolol succinate (TOPROL-XL) 50 MG 24 hr tablet Take 1 tablet by mouth daily.   . metroNIDAZOLE (FLAGYL) 500 MG tablet Take 500 mg by mouth 2 (two) times daily.   . Multiple Vitamin (MULTI-VITAMINS) TABS Take 1 tablet by mouth daily.   Marland Kitchen omeprazole (PRILOSEC) 40 MG capsule Take 40 mg by mouth daily.   . potassium chloride SA (K-DUR,KLOR-CON) 20 MEQ tablet Take 3 tablets by mouth daily. 12/11/2017: 2 in the morning and 1 at night   . pravastatin (PRAVACHOL) 40 MG tablet Take 1 tablet by mouth at bedtime.   Marland Kitchen PREVACID 30 MG capsule    . traZODone (DESYREL) 100 MG tablet Take 1 tablet by mouth at bedtime.   . valsartan (DIOVAN) 320 MG tablet Take 320 mg by mouth daily.    Facility-Administered Encounter Medications as of 08/18/2019  Medication  . 0.9 %  sodium chloride infusion   Allergies  Allergen Reactions  . Ace Inhibitors Cough  . Doxycycline     Rash   . Keflex [Cephalexin]   . Latex     Burn and itch  . Nexium [Esomeprazole Magnesium] Diarrhea  . Protonix [Pantoprazole Sodium] Other (See Comments)    Constipation    SDOH Screenings   Alcohol Screen:   . Last Alcohol Screening Score (AUDIT):   Depression (PHQ2-9): Low Risk   .  PHQ-2 Score: 0  Financial Resource Strain:   . Difficulty of Paying Living Expenses:   Food Insecurity:   . Worried About Charity fundraiser in the Last Year:   . [place] in the Last Year:   Housing:   . Last Housing Risk Score:   Physical Activity:   . Days of Exercise per Week:   . Minutes of Exercise per Session:   Social Connections:   . Frequency of Communication with Friends and Family:   . Frequency of Social Gatherings with Friends and Family:   . Attends Religious Services:   . Active Member of Clubs or Organizations:   .  Attends Archivist Meetings:   Marland Kitchen Marital Status:   Stress:   . Feeling of Stress :   Tobacco Use: Low Risk   . Smoking Tobacco Use: Never Smoker  . Smokeless Tobacco Use: Never Used  Transportation Needs:   . Film/video editor (Medical):   Marland Kitchen Lack of Transportation (Non-Medical):      Current Diagnosis/Assessment:  Goals Addressed   None    Hyperlipidemia   Lipid Panel     Component Value Date/Time   CHOL 109 06/03/2019 1408   TRIG 107 06/03/2019 1408   HDL 39 (L) 06/03/2019 1408   LDLCALC 50 06/03/2019 1408   LABVLDL 20 06/03/2019 1408     The ASCVD Risk score (Goff DC Jr., et al., 2013) failed to calculate for the following reasons:   The valid total cholesterol range is 130 to 320 mg/dL   Patient has failed these meds in past: *** Patient is currently {CHL Controlled/Uncontrolled:2160170081} on the following medications: pravastatin 40 mg bedtime, aspirin ec 81 mg  We discussed:  {CHL HP Upstream Pharmacy discussion:[phone]}  Plan  Continue {CHL HP Upstream Pharmacy Plans:[phone]}  Diabetes   Recent Relevant Labs: Lab Results  Component Value Date/Time   HGBA1C 6.6 (H) 06/03/2019 02:08 PM   MICROALBUR 10 06/03/2019 11:13 AM     Checking BG: {CHL HP Blood Glucose Monitoring Frequency:[phone]}  Recent FBG Readings: Recent pre-meal BG readings: *** Recent 2hr PP BG readings:  *** Recent HS BG readings: *** Patient has failed these meds in past: *** Patient is currently {CHL Controlled/Uncontrolled:2160170081} on the following medications: metformin 1000 mg daily, aviva plus testing supplies  Last diabetic Foot exam: No results found for: HMDIABEYEEXA  Last diabetic Eye exam: No results found for: HMDIABFOOTEX   We discussed: {CHL HP Upstream Pharmacy discussion:[phone]}  Plan  Continue {CHL HP Upstream Pharmacy Plans:[phone]},  Hypertension   BP today is:  {CHL HP UPSTREAM Pharmacist BP ranges:[phone]}  Office  blood pressures are  BP Readings from Last 3 Encounters:  07/20/19 138/72  06/03/19 124/70  02/16/19 128/82    Patient has failed these meds in the past: azilsartan medoxomil 40 mg - 2 tablets daily Patient is currently controlled/uncontrolled*** on the following medications: chlorthalidone 25 mg daily, metoprolol succinate 50 mg daily, valsartan 320 mg daily  Patient checks BP at home {CHL HP BP Monitoring [phone]}  Patient home BP readings are ranging: ***  We discussed {CHL HP Upstream Pharmacy discussion:[phone]}  Plan  Continue {CHL HP Upstream Pharmacy Plans:[phone]}   GERD   Patient has failed these meds in past: ranitidine 300 mg qhs Patient is currently {CHL Controlled/Uncontrolled:2160170081} on the following medications: prevacid 30 mg   We discussed:  {CHL HP Upstream Pharmacy discussion:[phone]}  Plan  Continue {CHL HP Upstream Pharmacy Plans:[phone]}     and  Insomnia    Patient has failed these meds in past: *** Patient is currently {CHL Controlled/Uncontrolled:[phone]} on the following medications: melatonin 5 mg, traozodone 100 mg bedtime   We discussed:  {CHL HP Upstream Pharmacy [phone]}  Plan  Continue {CHL HP Upstream Pharmacy Plans:[id number]}   Allergies???***   Patient has failed these meds in past: *** Patient is currently {CHL Controlled/Uncontrolled:[phone]} on the following medications: cetirizine 10 mg daily, fluticasone nasal prn  We discussed:  {CHL HP Upstream Pharmacy [phone]}  Plan  Continue {CHL HP Upstream Pharmacy Plans:[id number]}   Health Maintenance   Patient is currently {CHL Controlled/Uncontrolled:[phone]} on the following medications:  Multivitamin Premarin -  Citracal+D -  Tylenol - arthritis pain  We discussed:  ***  Plan  Continue {CHL HP Upstream Pharmacy PH:[id number]  Vaccines   Reviewed and discussed patient's vaccination history.     Immunization History  Administered Date(s) Administered  . Hepatitis B 06/22/2013, 11/16/2014, 05/26/2015  . Influenza, High Dose Seasonal PF 12/29/2018  . Pneumococcal Conjugate-13 12/29/2013  . Pneumococcal Polysaccharide-23 04/13/2012  . Td 11/23/2014    Plan  Recommended patient receive *** vaccine in *** office/pharmacy.   Medication Management   Pt uses *** pharmacy for all medications ***pill box Pt endorses ***% compliance  We discussed: ***  Plan  ***     Follow up: ***

## 2019-08-18 ENCOUNTER — Telehealth: Payer: Medicare Other

## 2019-08-19 ENCOUNTER — Other Ambulatory Visit: Payer: Self-pay

## 2019-08-19 DIAGNOSIS — E1142 Type 2 diabetes mellitus with diabetic polyneuropathy: Secondary | ICD-10-CM

## 2019-08-19 DIAGNOSIS — N952 Postmenopausal atrophic vaginitis: Secondary | ICD-10-CM | POA: Diagnosis not present

## 2019-08-19 DIAGNOSIS — S82831A Other fracture of upper and lower end of right fibula, initial encounter for closed fracture: Secondary | ICD-10-CM | POA: Diagnosis not present

## 2019-08-19 DIAGNOSIS — F5101 Primary insomnia: Secondary | ICD-10-CM

## 2019-08-19 NOTE — Progress Notes (Signed)
Patient had an appointment on 08/18/2019 with Sherre Poot, PharmD

## 2019-08-21 ENCOUNTER — Other Ambulatory Visit: Payer: Self-pay | Admitting: Family Medicine

## 2019-09-20 DIAGNOSIS — S82831A Other fracture of upper and lower end of right fibula, initial encounter for closed fracture: Secondary | ICD-10-CM | POA: Diagnosis not present

## 2019-09-29 NOTE — Progress Notes (Signed)
Established Patient Office Visit  Subjective:  Patient ID: Megan Galvan, female    DOB: 1950/07/13  Age: 69 y.o. MRN: [id number]  CC:  Chief Complaint  Patient presents with  . Diabetes  . Hyperlipidemia  . Hypertension  . Gastroesophageal Reflux    HPI  Megan Galvan presents for Diabetes She presents for her follow-up diabetic visit. She has type 2 diabetes mellitus. Pertinent negatives for diabetes include no polydipsia, no polyphagia and no polyuria. There are no hypoglycemic complications. Diabetic complications include peripheral neuropathy. Risk factors for coronary artery disease include dyslipidemia. Current diabetic treatment includes diet and oral agent (monotherapy) (Metformin). She is compliant with treatment all of the time. She is following a diabetic diet. She rarely participates in exercise.  Exercise decreased due to ankle sprain. Her fasting blood glucose range is generally 120-140 mg/dl. An ACE inhibitor/angiotensin II receptor blocker is being taken. Eye exam is current (10/2018). Hyperlipidemia This is a chronic problem. The current episode started more than 1 year ago. The problem is controlled.  Pertinent negatives include no chest pain, myalgias or shortness of breath. Current antihyperlipidemic treatment includes statins (pravastatin). Compliance problems include adherence to exercise.   Hypertension This is a chronic problem. The current episode started more than 1 year ago. The problem is well controlled. Pertinent negatives include no chest pain, headaches or shortness of breath. Currently on valsartan 320 mg once daily, chlorthalidone 25 mg once daily, and metoprolol xl 50 mg 1/2 pill daily.    Gastroesophageal Reflux: Long standing problem that is well controlled. She reports no abdominal pain, no chest pain, no coughing, no nausea or no sore throat. This is a chronic problem. She has tried a omeprazole and pepcid for the symptoms. The treatment provided  significant relief.    Past Medical History:  Diagnosis Date  . Anemia   . Arthritis   . Bone spur of acromioclavicular joint, left   . Cataract   . Diabetes mellitus without complication ([place])   . GERD (gastroesophageal reflux disease)   . Heart murmur   . Hyperlipidemia   . Hypertension   . Nonrheumatic aortic (valve) stenosis   . Plantar fasciitis   . Sleep apnea    cpap    Past Surgical History:  Procedure Laterality Date  . BACK SURGERY  11/17/2016   L3-L5  . BILATERAL CARPAL TUNNEL RELEASE    . bone spur Left    left shoulder  . bone spur Right    Right shoulder  . COLONOSCOPY  08/22/2011   Moderate predominantly sigmoid diverticulosis. Small internal hemorrhoids.   Marland Kitchen NECK SURGERY    . NECK SURGERY    . torn rotator cuff Right    Right shoulder  . torn rotator cuff Left    left shoulder  . TRIGGER FINGER RELEASE Left    thumb  . TRIGGER FINGER RELEASE Right    thumb and middle finger  . WRIST SURGERY Left    torn ligament  . WRIST SURGERY Right    cyst    Family History  Problem Relation Age of Onset  . Colon cancer Paternal Grandmother   . Esophageal cancer Neg Hx   . Rectal cancer Neg Hx   . Stomach cancer Neg Hx     Social History   Socioeconomic History  . Marital status: Married    Spouse name: Not on file  . Number of children: 2  . Years of education: Not on file  . Highest  education level: Not on file  Occupational History  . Not on file  Tobacco Use  . Smoking status: Never Smoker  . Smokeless tobacco: Never Used  Substance and Sexual Activity  . Alcohol use: Never  . Drug use: Never  . Sexual activity: Not on file  Other Topics Concern  . Not on file  Social History Narrative  . Not on file   Social Determinants of Health   Financial Resource Strain:   . Difficulty of Paying Living Expenses:   Food Insecurity: No Food Insecurity  . Worried About Charity fundraiser in the Last Year: Never true  . Ran Out of Food in the  Last Year: Never true  Transportation Needs: No Transportation Needs  . Lack of Transportation (Medical): No  . Lack of Transportation (Non-Medical): No  Physical Activity:   . Days of Exercise per Week:   . Minutes of Exercise per Session:   Stress:   . Feeling of Stress :   Social Connections:   . Frequency of Communication with Friends and Family:   . Frequency of Social Gatherings with Friends and Family:   . Attends Religious Services:   . Active Member of Clubs or Organizations:   . Attends Archivist Meetings:   Marland Kitchen Marital Status:   Intimate Partner Violence:   . Fear of Current or Ex-Partner:   . Emotionally Abused:   Marland Kitchen Physically Abused:   . Sexually Abused:     Outpatient Medications Prior to Visit  Medication Sig Dispense Refill  . ACCU-CHEK AVIVA PLUS test strip CHECK BLOOD SUGAR ONCE  DAILY 100 strip 3  . acetaminophen (TYLENOL) 500 MG tablet 500 mg as needed.     . ANUSOL-HC 2.5 % rectal cream Place 1 application rectally as needed.     Marland Kitchen aspirin EC 81 MG tablet Take 81 mg by mouth daily.    . calcium citrate-vitamin D (CITRACAL+D) 315-200 MG-UNIT tablet Take 2 tablets by mouth daily.     . cetirizine (ZYRTEC) 10 MG tablet Take 1 tablet by mouth daily.    . chlorthalidone (HYGROTON) 25 MG tablet TAKE 1 TABLET BY MOUTH ONCE DAILY (Patient taking differently: Take 25 mg by mouth daily. ) 90 tablet 3  . conjugated estrogens (PREMARIN) vaginal cream Place 1 Applicatorful vaginally daily. (Patient taking differently: Place 1 Applicatorful vaginally 2 (two) times a week. ) 42.5 g 12  . famotidine (PEPCID) 40 MG tablet TAKE 1 TABLET BY MOUTH AT  BEDTIME 90 tablet 3  . fluticasone (FLONASE) 50 MCG/ACT nasal spray Place 1 spray into the nose as needed.    . magnesium oxide (MAG-OX) 400 MG tablet Take 400 mg by mouth 2 (two) times daily.    . Melatonin 5 MG CAPS     . metFORMIN (GLUCOPHAGE) 1000 MG tablet TAKE 1 TABLET BY MOUTH ONCE DAILY WITH MORNING MEAL (Patient  taking differently: Take 1,000 mg by mouth at bedtime. ) 90 tablet 3  . metoprolol succinate (TOPROL-XL) 50 MG 24 hr tablet TAKE ONE-HALF TABLET BY  MOUTH DAILY 130 tablet 0  . Multiple Vitamin (MULTI-VITAMINS) TABS Take 1 tablet by mouth daily.    Marland Kitchen omeprazole (PRILOSEC) 40 MG capsule Take 40 mg by mouth in the morning and at bedtime.     . potassium chloride SA (K-DUR,KLOR-CON) 20 MEQ tablet Take 3 tablets by mouth daily. Takes 2 in the morning and 1 at night.    . pravastatin (PRAVACHOL) 40 MG tablet Take 1  tablet by mouth at bedtime.    . traZODone (DESYREL) 100 MG tablet Take 1 tablet by mouth at bedtime.    . valsartan (DIOVAN) 320 MG tablet Take 320 mg by mouth daily.    . AMBULATORY NON FORMULARY MEDICATION Nifedipine 5%    . fluconazole (DIFLUCAN) 150 MG tablet     . metroNIDAZOLE (FLAGYL) 500 MG tablet Take 500 mg by mouth 2 (two) times daily.    Marland Kitchen PREVACID 30 MG capsule      Facility-Administered Medications Prior to Visit  Medication Dose Route Frequency Provider Last Rate Last Admin  . 0.9 %  sodium chloride infusion  500 mL Intravenous Once Jackquline Denmark, MD        Allergies  Allergen Reactions  . Ace Inhibitors Cough  . Doxycycline     Rash   . Keflex [Cephalexin]   . Latex     Burn and itch  . Nexium [Esomeprazole Magnesium] Diarrhea  . Protonix [Pantoprazole Sodium] Other (See Comments)    Constipation    ROS Review of Systems  Constitutional: Negative for chills and fever.  HENT: Negative for congestion, ear pain and sore throat.   Respiratory: Negative for cough and shortness of breath.   Cardiovascular: Negative for chest pain.  Gastrointestinal: Negative for abdominal pain, constipation, diarrhea, nausea and vomiting.  Endocrine: Positive for polyuria. Negative for polydipsia and polyphagia.  Genitourinary: Positive for frequency. Negative for dysuria and urgency (Urge incontinence. prefers no medicines.).  Musculoskeletal: Negative for arthralgias, back  pain and myalgias.  Neurological: Negative for dizziness and headaches.  Psychiatric/Behavioral: Negative for dysphoric mood and sleep disturbance. The patient is not nervous/anxious.       Objective:    Physical Exam  Constitutional: She is oriented to person, place, and time. She appears well-developed and well-nourished.  Cardiovascular: Normal rate, regular rhythm and normal pulses.  Murmur heard. Pulmonary/Chest: Effort normal and breath sounds normal. No respiratory distress. She has no wheezes. She has no rales.  Abdominal: Soft. Bowel sounds are normal. There is no abdominal tenderness.  Musculoskeletal:        General: No edema.  Neurological: She is alert and oriented to person, place, and time.  Skin: Skin is warm.  Psychiatric: She has a normal mood and affect.   Diabetic Foot Exam - Simple   Simple Foot Form Visual Inspection No deformities, no ulcerations, no other skin breakdown bilaterally: Yes Sensation Testing Intact to touch and monofilament testing bilaterally: Yes Pulse Check Posterior Tibialis and Dorsalis pulse intact bilaterally: Yes Comments     BP (!) 148/78   Pulse 72   Temp (!) 97.2 F (36.2 C)   Resp 14   Ht 5\' 6"  (1.676 m)   Wt 213 lb 3.2 oz (96.7 kg)   BMI 34.41 kg/m  Wt Readings from Last 3 Encounters:  10/01/19 213 lb 3.2 oz (96.7 kg)  07/20/19 212 lb (96.2 kg)  06/03/19 211 lb 3.2 oz (95.8 kg)     Health Maintenance Due  Topic Date Due  . Hepatitis C Screening  Never done  . FOOT EXAM  Never done  . OPHTHALMOLOGY EXAM  Never done  . PNA vac Low Risk Adult (2 of 2 - PPSV23) 04/13/2017    There are no preventive care reminders to display for this patient.  No results found for: TSH Lab Results  Component Value Date   WBC 6.6 06/03/2019   HGB 11.8 06/03/2019   HCT 34.4 06/03/2019   MCV 96  06/03/2019   PLT 241 06/03/2019   Lab Results  Component Value Date   NA 141 06/03/2019   K 3.6 06/03/2019   CO2 25 06/03/2019    GLUCOSE 131 (H) 06/03/2019   BUN 18 06/03/2019   CREATININE 0.88 06/03/2019   BILITOT 0.5 06/03/2019   ALKPHOS 62 06/03/2019   AST 33 06/03/2019   ALT 28 06/03/2019   PROT 6.8 06/03/2019   ALBUMIN 4.4 06/03/2019   CALCIUM 9.3 06/03/2019   Lab Results  Component Value Date   CHOL 109 06/03/2019   Lab Results  Component Value Date   HDL 39 (L) 06/03/2019   Lab Results  Component Value Date   LDLCALC 50 06/03/2019   Lab Results  Component Value Date   TRIG 107 06/03/2019   No results found for: [facility] Lab Results  Component Value Date   HGBA1C 6.6 (H) 06/03/2019      Assessment & Plan:  1. Type 2 diabetes mellitus with diabetic polyneuropathy, without long-term current use of insulin (HCC) Control: good Recommend check sugars fasting daily. Recommend check feet daily. Recommend annual eye exams. Medicines: no changes Continue to work on eating a healthy diet and exercise.  Labs drawn today.   - Hemoglobin A1c - CBC with Differential/Platelet - POCT UA - Microalbumin  2. Mixed hyperlipidemia Well controlled.  No changes to medicines.  Continue to work on eating a healthy diet and exercise.  Labs drawn today.  - Lipid panel  3. Essential hypertension Well controlled.  No changes to medicines.  Continue to work on eating a healthy diet and exercise.  Labs drawn today.  - Comprehensive metabolic panel  4. Class 1 obesity due to excess calories with serious comorbidity and body mass index (BMI) of 34.0 to 34.9 in adult Recommend continue to work on eating healthy diet and exercise.  5. GERD - well controlled. Continue current medicines.  Follow-up: Return in about 4 months (around 01/31/2020) for fasting.    Rochel Brome, MD

## 2019-09-30 ENCOUNTER — Ambulatory Visit: Payer: Medicare Other

## 2019-09-30 DIAGNOSIS — E1142 Type 2 diabetes mellitus with diabetic polyneuropathy: Secondary | ICD-10-CM

## 2019-09-30 DIAGNOSIS — E782 Mixed hyperlipidemia: Secondary | ICD-10-CM

## 2019-09-30 NOTE — Chronic Care Management (AMB) (Signed)
Chronic Care Management Pharmacy  Name: Megan Galvan  MRN: [id number] DOB: [birth year]  Chief Complaint/ HPI  Megan Galvan,  69 y.o. , female presents for their Initial CCM visit with the clinical pharmacist via telephone due to COVID-19 Pandemic.  PCP : Rochel Brome, MD  Their chronic conditions include: GERD, DM, Insomnia, Vertigo, HTN, HLD, Anemia.  Office Visits: 07/20/2019 - Vertigo - ordered meclizine 12.5 mg bid. 06/03/2019 - no med changes. Encouraged diet and exercise.  05/24/2019 - Postmenopausal atrophic vaginitis symptoms of itching and acute cystitis. Premarin vaginal cream started.  03/18/2019 - Medicare AWV Consult Visit: 06/02/2019 - Dexa scan revealed osteopenia. Negative Mammogram 03/01/2019 - Dr. Amalia Hailey procedure for hemorrhoids. Oxycodone for post-op pain. 02/22/2019 - COVID test for procedure. 02/17/2019 - Pre-op consult with Dr. Amalia Hailey 02/16/2019 - Dr. Lyndel Safe visit for hemorrhoids and chronic constipation. Continue metamucil and miralax along with sitz bath and hydration. Referred to Dr. Amalia Hailey for evaluation of hemorrhoid removal Medications: Outpatient Encounter Medications as of 09/30/2019  Medication Sig Note  . ACCU-CHEK AVIVA PLUS test strip CHECK BLOOD SUGAR ONCE  DAILY   . acetaminophen (TYLENOL) 500 MG tablet 500 mg as needed.    . ANUSOL-HC 2.5 % rectal cream Place 1 application rectally as needed.    Marland Kitchen aspirin EC 81 MG tablet Take 81 mg by mouth daily.   . calcium citrate-vitamin D (CITRACAL+D) 315-200 MG-UNIT tablet Take 2 tablets by mouth daily.    . cetirizine (ZYRTEC) 10 MG tablet Take 1 tablet by mouth daily.   . chlorthalidone (HYGROTON) 25 MG tablet TAKE 1 TABLET BY MOUTH ONCE DAILY (Patient taking differently: Take 25 mg by mouth daily. )   . conjugated estrogens (PREMARIN) vaginal cream Place 1 Applicatorful vaginally daily. (Patient taking differently: Place 1 Applicatorful vaginally 2 (two) times a week. )   . famotidine (PEPCID) 40 MG  tablet TAKE 1 TABLET BY MOUTH AT  BEDTIME   . fluticasone (FLONASE) 50 MCG/ACT nasal spray Place 1 spray into the nose as needed. 12/11/2017: 1 spray in each   . magnesium oxide (MAG-OX) 400 MG tablet Take 400 mg by mouth 2 (two) times daily.   . Melatonin 5 MG CAPS    . metFORMIN (GLUCOPHAGE) 1000 MG tablet TAKE 1 TABLET BY MOUTH ONCE DAILY WITH MORNING MEAL (Patient taking differently: Take 1,000 mg by mouth at bedtime. )   . metoprolol succinate (TOPROL-XL) 50 MG 24 hr tablet TAKE ONE-HALF TABLET BY  MOUTH DAILY   . Multiple Vitamin (MULTI-VITAMINS) TABS Take 1 tablet by mouth daily.   Marland Kitchen omeprazole (PRILOSEC) 40 MG capsule Take 40 mg by mouth in the morning and at bedtime.    . potassium chloride SA (K-DUR,KLOR-CON) 20 MEQ tablet Take 3 tablets by mouth daily. Takes 2 in the morning and 1 at night. 12/11/2017: 2 in the morning and 1 at night   . pravastatin (PRAVACHOL) 40 MG tablet Take 1 tablet by mouth at bedtime.   . traZODone (DESYREL) 100 MG tablet Take 1 tablet by mouth at bedtime.   . valsartan (DIOVAN) 320 MG tablet Take 320 mg by mouth daily.   . AMBULATORY NON FORMULARY MEDICATION Nifedipine 5%   . fluconazole (DIFLUCAN) 150 MG tablet    . metroNIDAZOLE (FLAGYL) 500 MG tablet Take 500 mg by mouth 2 (two) times daily.   Marland Kitchen PREVACID 30 MG capsule     Facility-Administered Encounter Medications as of 09/30/2019  Medication  . 0.9 %  sodium chloride infusion  Allergies  Allergen Reactions  . Ace Inhibitors Cough  . Doxycycline     Rash   . Keflex [Cephalexin]   . Latex     Burn and itch  . Nexium [Esomeprazole Magnesium] Diarrhea  . Protonix [Pantoprazole Sodium] Other (See Comments)    Constipation   SDOH Screenings   Alcohol Screen:   . Last Alcohol Screening Score (AUDIT):   Depression (PHQ2-9): Low Risk   . PHQ-2 Score: 0  Financial Resource Strain:   . Difficulty of Paying Living Expenses:   Food Insecurity: No Food Insecurity  . Worried About Charity fundraiser  in the Last Year: Never true  . Ran Out of Food in the Last Year: Never true  Housing: Low Risk   . Last Housing Risk Score: 0  Physical Activity:   . Days of Exercise per Week:   . Minutes of Exercise per Session:   Social Connections:   . Frequency of Communication with Friends and Family:   . Frequency of Social Gatherings with Friends and Family:   . Attends Religious Services:   . Active Member of Clubs or Organizations:   . Attends Archivist Meetings:   Marland Kitchen Marital Status:   Stress:   . Feeling of Stress :   Tobacco Use: Low Risk   . Smoking Tobacco Use: Never Smoker  . Smokeless Tobacco Use: Never Used  Transportation Needs: No Transportation Needs  . Lack of Transportation (Medical): No  . Lack of Transportation (Non-Medical): No    Current Diagnosis/Assessment:  Goals Addressed            This Visit's Progress   . Pharmacy Care Plan       CARE PLAN ENTRY  Current Barriers:  . Chronic Disease Management support, education, and care coordination needs related to Hypertension, Hyperlipidemia, and Diabetes\   Hypertension . Pharmacist Clinical Goal(s): o Over the next 90 days, patient will work with PharmD and providers to maintain BP goal <130/80 . Current regimen:  o chlorthalidone 25 mg daily  o metoprolol succinate 25 mg daily o valsartan 320 mg daily . Interventions: o Keep up the good work with healthy diet.  . Patient self care activities - Over the next 90 days, patient will: o Check BP monthly, document, and provide at future appointments o Ensure daily salt intake < 2300 mg/day  Hyperlipidemia . Pharmacist Clinical Goal(s): o Over the next 90 days, patient will work with PharmD and providers to maintain LDL goal < 70 . Current regimen:  . pravastatin 40 mg bedtime . aspirin ec 81 mg daily . Interventions: o Keep up the good work with healthy diet.  . Patient self care activities - Over the next 90 days, patient will: o Recommend  continuing to incorporate more walking at home each week.   Diabetes . Pharmacist Clinical Goal(s): o Over the next 90 days, patient will work with PharmD and providers to maintain A1c goal <7% . Current regimen:  o Metformin 1000 mg at bedtime . Interventions: o Keep up the good work eating a healthy diet full of vegetables.  . Patient self care activities - Over the next 90 days, patient will: o Check blood sugar once daily, document, and provide at future appointments o Contact provider with any episodes of hypoglycemia  Medication management . Pharmacist Clinical Goal(s): o Over the next 90 days, patient will work with PharmD and providers to maintain optimal medication adherence . Current pharmacy: [facility] . Interventions  o Comprehensive medication review performed. o Continue current medication management strategy . Patient self care activities - Over the next 90 days, patient will: o Focus on medication adherence by continuing to use weekly pill box.  o Take medications as prescribed o Report any questions or concerns to PharmD and/or provider(s)  Initial goal documentation       Diabetes   Recent Relevant Labs: Lab Results  Component Value Date/Time   HGBA1C 6.6 (H) 06/03/2019 02:08 PM   MICROALBUR 10 06/03/2019 11:13 AM     Checking BG: Weekly  Recent FBG Readings: 142 mg/dL Patient has failed these meds in past: n/a Patient is currently controlled on the following medications: metformin 1000 mg daily, aviva plus testing supplies  Last diabetic Foot exam:  05/2019 Last diabetic Eye exam: 10/2018  We discussed: diet and exercise extensively. Doesn't get much exercise. Walks some while shopping weekly. Tries to watch her diet. Tries to eat more vegetables in her diet daily. Avoiding potatoes, macaroni and cheese, rice, etc. Tries not to eat sweets daily.   Plan  Continue current medications  Hypertension   BP today is:  <130/80  Office blood  pressures are  BP Readings from Last 3 Encounters:  07/20/19 138/72  06/03/19 124/70  02/16/19 128/82    Patient has failed these meds in the past: azilsartan medoxomil  Patient is currently controlled on the following medications: chlorthalidone 25 mg daily, metoprolol succinate 50 mg daily, valsartan 320 mg daily  Patient checks BP at home several times per month  Patient home BP readings are ranging: 128/72 mmHg  We discussed diet and exercise extensively  Plan  Continue current medications   Hyperlipidemia   Lipid Panel     Component Value Date/Time   CHOL 109 06/03/2019 1408   TRIG 107 06/03/2019 1408   HDL 39 (L) 06/03/2019 1408   LDLCALC 50 06/03/2019 1408     The ASCVD Risk score (Goff DC Jr., et al., 2013) failed to calculate for the following reasons:   The valid total cholesterol range is 130 to 320 mg/dL   Patient has failed these meds in past: n/a Patient is currently controlled on the following medications:  . pravastatin 40 mg bedtime . aspirin ec 81 mg daily  We discussed:  diet and exercise extensively  Plan  Continue current medications    and  Other Diagnosis:GERD    Patient has failed these meds in past: n/a Patient is currently controlled on the following medications: prevacid 30 mg daily, famotidine 40 mg qhs  We discussed:  diet and exercise extensively. Trigger foods or overating can flare up symptoms. Meats (stew beef) aggravate her symptoms at times.   Plan  Continue current medications   Insomnia   Patient has failed these meds in past: n/a Patient is currently controlled on the following medications:  . melatonin 5 mg . traozodone 100 mg bedtime   We discussed:  Patient reports good sleep with both medications.   Plan  Continue current medications  Health Maintenance   Patient is currently controlled on the following medications:  Marland Kitchen Multivitamin - general health . Premarin - twice weekly . Citracal+D - bone  health . Magnesium bid - bone health . Tylenol - arthritis pain . cetirizine 10 mg daily - allergies . fluticasone nasal prn - allergies  We discussed:  Patient started magnesium twice daily when Dr. [name] recommended. She is using for bone health. Patient is currently taking Citracal + D two tablets daily.  We discussed changing to 1 tablet twice daily to ensure the calcium is absorbed best. Patient agrees and plans to change timing of medication.   Plan  Continue current medications  Vaccines   Reviewed and discussed patient's vaccination history.  Has had both COVID vaccines Moderna. We discussed Shingrix but patient checked on the price last year and was going to be $300 for series. She is going to call her pharmacy this week to see if the copay has changed for this year.   Immunization History  Administered Date(s) Administered  . Hepatitis B 06/22/2013, 11/16/2014, 05/26/2015  . Influenza, High Dose Seasonal PF 12/29/2018  . Pneumococcal Conjugate-13 12/29/2013  . Pneumococcal Polysaccharide-23 04/13/2012  . Td 11/23/2014    Plan  Recommended patient receive annual flu vaccine in office.   Medication Management   [facility] Uses pill box? Yes Pt endorses 100% compliance  We discussed: Patient does not miss doses of her medication. Her pill box is helping keep her on track.   Plan  Continue current medication management strategy    Follow up: 6 month phone visit

## 2019-09-30 NOTE — Patient Instructions (Addendum)
Visit Information  Thank you for your time discussing your medications. I look forward to working with you to achieve your health care goals. Below is a summary of what we talked about during our visit.   Goals Addressed            This Visit's Progress   . Pharmacy Care Plan       CARE PLAN ENTRY  Current Barriers:  . Chronic Disease Management support, education, and care coordination needs related to Hypertension, Hyperlipidemia, and Diabetes\   Hypertension . Pharmacist Clinical Goal(s): o Over the next 90 days, patient will work with PharmD and providers to maintain BP goal <130/80 . Current regimen:  o chlorthalidone 25 mg daily  o metoprolol succinate 25 mg daily o valsartan 320 mg daily . Interventions: o Keep up the good work with healthy diet.  . Patient self care activities - Over the next 90 days, patient will: o Check BP monthly, document, and provide at future appointments o Ensure daily salt intake < 2300 mg/day  Hyperlipidemia . Pharmacist Clinical Goal(s): o Over the next 90 days, patient will work with PharmD and providers to maintain LDL goal < 70 . Current regimen:  . pravastatin 40 mg bedtime . aspirin ec 81 mg daily . Interventions: o Keep up the good work with healthy diet.  . Patient self care activities - Over the next 90 days, patient will: o Recommend continuing to incorporate more walking at home each week.   Diabetes . Pharmacist Clinical Goal(s): o Over the next 90 days, patient will work with PharmD and providers to maintain A1c goal <7% . Current regimen:  o Metformin 1000 mg at bedtime . Interventions: o Keep up the good work eating a healthy diet full of vegetables.  . Patient self care activities - Over the next 90 days, patient will: o Check blood sugar once daily, document, and provide at future appointments o Contact provider with any episodes of hypoglycemia  Medication management . Pharmacist Clinical Goal(s): o Over the  next 90 days, patient will work with PharmD and providers to maintain optimal medication adherence . Current pharmacy: [facility] . Interventions o Comprehensive medication review performed. o Continue current medication management strategy . Patient self care activities - Over the next 90 days, patient will: o Focus on medication adherence by continuing to use weekly pill box.  o Take medications as prescribed o Report any questions or concerns to PharmD and/or provider(s)  Initial goal documentation        Ms. Bubb was given information about Chronic Care Management services today including:  1. CCM service includes personalized support from designated clinical staff supervised by her physician, including individualized plan of care and coordination with other care providers 2. 24/7 contact phone numbers for assistance for urgent and routine care needs. 3. Standard insurance, coinsurance, copays and deductibles apply for chronic care management only during months in which we provide at least 20 minutes of these services. Most insurances cover these services at 100%, however patients may be responsible for any copay, coinsurance and/or deductible if applicable. This service may help you avoid the need for more expensive face-to-face services. 4. Only one practitioner may furnish and bill the service in a calendar month. 5. The patient may stop CCM services at any time (effective at the end of the month) by phone call to the office staff.  Patient agreed to services and verbal consent obtained.   The patient verbalized understanding of instructions  provided today and agreed to receive a mailed copy of patient instruction and/or educational materials. Telephone follow up appointment with pharmacy team member scheduled for:03/2020  Sherre Poot, PharmD Clinical Pharmacist [place] [phone] (office) [phone] (mobile)  Exercises To Do While  Sitting  Exercises that you do while sitting (chair exercises) can give you many of the same benefits as full exercise. Benefits include strengthening your heart, burning calories, and keeping muscles and joints healthy. Exercise can also improve your mood and help with depression and anxiety. You may benefit from chair exercises if you are unable to do standing exercises because of:  Diabetic foot pain.  Obesity.  Illness.  Arthritis.  Recovery from surgery or injury.  Breathing problems.  Balance problems.  Another type of disability. Before starting chair exercises, check with your health care provider or a physical therapist to find out how much exercise you can tolerate and which exercises are safe for you. If your health care provider approves:  Start out slowly and build up over time. Aim to work up to about 10-20 minutes for each exercise session.  Make exercise part of your daily routine.  Drink water when you exercise. Do not wait until you are thirsty. Drink every 10-15 minutes.  Stop exercising right away if you have pain, nausea, shortness of breath, or dizziness.  If you are exercising in a wheelchair, make sure to lock the wheels.  Ask your health care provider whether you can do tai chi or yoga. Many positions in these mind-body exercises can be modified to do while seated. Warm-up Before starting other exercises: 1. Sit up as straight as you can. Have your knees bent at 90 degrees, which is the shape of the capital letter "L." Keep your feet flat on the floor. 2. Sit at the front edge of your chair, if you can. 3. Pull in (tighten) the muscles in your abdomen and stretch your spine and neck as straight as you can. Hold this position for a few minutes. 4. Breathe in and out evenly. Try to concentrate on your breathing, and relax your mind. Stretching Exercise A: Arm stretch 1. Hold your arms out straight in front of your body. 2. Bend your hands at the wrist  with your fingers pointing up, as if signaling someone to stop. Notice the slight tension in your forearms as you hold the position. 3. Keeping your arms out and your hands bent, rotate your hands outward as far as you can and hold this stretch. Aim to have your thumbs pointing up and your pinkie fingers pointing down. Slowly repeat arm stretches for one minute as tolerated. Exercise B: Leg stretch 1. If you can move your legs, try to "draw" letters on the floor with the toes of your foot. Write your name with one foot. 2. Write your name with the toes of your other foot. Slowly repeat the movements for one minute as tolerated. Exercise C: Reach for the sky 1. Reach your hands as far over your head as you can to stretch your spine. 2. Move your hands and arms as if you are climbing a rope. Slowly repeat the movements for one minute as tolerated. Range of motion exercises Exercise A: Shoulder roll 1. Let your arms hang loosely at your sides. 2. Lift just your shoulders up toward your ears, then let them relax back down. 3. When your shoulders feel loose, rotate your shoulders in backward and forward circles. Do shoulder rolls slowly for one  minute as tolerated. Exercise B: March in place 1. As if you are marching, pump your arms and lift your legs up and down. Lift your knees as high as you can. ? If you are unable to lift your knees, just pump your arms and move your ankles and feet up and down. March in place for one minute as tolerated. Exercise C: Seated jumping jacks 1. Let your arms hang down straight. 2. Keeping your arms straight, lift them up over your head. Aim to point your fingers to the ceiling. 3. While you lift your arms, straighten your legs and slide your heels along the floor to your sides, as wide as you can. 4. As you bring your arms back down to your sides, slide your legs back together. ? If you are unable to use your legs, just move your arms. Slowly repeat seated  jumping jacks for one minute as tolerated. Strengthening exercises Exercise A: Shoulder squeeze 1. Hold your arms straight out from your body to your sides, with your elbows bent and your fists pointed at the ceiling. 2. Keeping your arms in the bent position, move them forward so your elbows and forearms meet in front of your face. 3. Open your arms back out as wide as you can with your elbows still bent, until you feel your shoulder blades squeezing together. Hold for 5 seconds. Slowly repeat the movements forward and backward for one minute as tolerated. Contact a health care provider if you:  Had to stop exercising due to any of the following: ? Pain. ? Nausea. ? Shortness of breath. ? Dizziness. ? Fatigue.  Have significant pain or soreness after exercising. Get help right away if you have:  Chest pain.  Difficulty breathing. These symptoms may represent a serious problem that is an emergency. Do not wait to see if the symptoms will go away. Get medical help right away. Call your local emergency services (911 in the [place] Do not drive yourself to the hospital. This information is not intended to replace advice given to you by your health care provider. Make sure you discuss any questions you have with your health care provider. Document Revised: 08/06/2018 Document Reviewed: 02/26/2017 Elsevier Patient Education  [place].  Exercises To Do While Sitting  Exercises that you do while sitting (chair exercises) can give you many of the same benefits as full exercise. Benefits include strengthening your heart, burning calories, and keeping muscles and joints healthy. Exercise can also improve your mood and help with depression and anxiety. You may benefit from chair exercises if you are unable to do standing exercises because of:  Diabetic foot pain.  Obesity.  Illness.  Arthritis.  Recovery from surgery or injury.  Breathing problems.  Balance  problems.  Another type of disability. Before starting chair exercises, check with your health care provider or a physical therapist to find out how much exercise you can tolerate and which exercises are safe for you. If your health care provider approves:  Start out slowly and build up over time. Aim to work up to about 10-20 minutes for each exercise session.  Make exercise part of your daily routine.  Drink water when you exercise. Do not wait until you are thirsty. Drink every 10-15 minutes.  Stop exercising right away if you have pain, nausea, shortness of breath, or dizziness.  If you are exercising in a wheelchair, make sure to lock the wheels.  Ask your health care provider whether you can do  tai chi or yoga. Many positions in these mind-body exercises can be modified to do while seated. Warm-up Before starting other exercises: 5. Sit up as straight as you can. Have your knees bent at 90 degrees, which is the shape of the capital letter "L." Keep your feet flat on the floor. 6. Sit at the front edge of your chair, if you can. 7. Pull in (tighten) the muscles in your abdomen and stretch your spine and neck as straight as you can. Hold this position for a few minutes. 8. Breathe in and out evenly. Try to concentrate on your breathing, and relax your mind. Stretching Exercise A: Arm stretch 4. Hold your arms out straight in front of your body. 5. Bend your hands at the wrist with your fingers pointing up, as if signaling someone to stop. Notice the slight tension in your forearms as you hold the position. 6. Keeping your arms out and your hands bent, rotate your hands outward as far as you can and hold this stretch. Aim to have your thumbs pointing up and your pinkie fingers pointing down. Slowly repeat arm stretches for one minute as tolerated. Exercise B: Leg stretch 3. If you can move your legs, try to "draw" letters on the floor with the toes of your foot. Write your name with  one foot. 4. Write your name with the toes of your other foot. Slowly repeat the movements for one minute as tolerated. Exercise C: Reach for the sky 3. Reach your hands as far over your head as you can to stretch your spine. 4. Move your hands and arms as if you are climbing a rope. Slowly repeat the movements for one minute as tolerated. Range of motion exercises Exercise A: Shoulder roll 4. Let your arms hang loosely at your sides. 5. Lift just your shoulders up toward your ears, then let them relax back down. 6. When your shoulders feel loose, rotate your shoulders in backward and forward circles. Do shoulder rolls slowly for one minute as tolerated. Exercise B: March in place 2. As if you are marching, pump your arms and lift your legs up and down. Lift your knees as high as you can. ? If you are unable to lift your knees, just pump your arms and move your ankles and feet up and down. March in place for one minute as tolerated. Exercise C: Seated jumping jacks 5. Let your arms hang down straight. 6. Keeping your arms straight, lift them up over your head. Aim to point your fingers to the ceiling. 7. While you lift your arms, straighten your legs and slide your heels along the floor to your sides, as wide as you can. 8. As you bring your arms back down to your sides, slide your legs back together. ? If you are unable to use your legs, just move your arms. Slowly repeat seated jumping jacks for one minute as tolerated. Strengthening exercises Exercise A: Shoulder squeeze 4. Hold your arms straight out from your body to your sides, with your elbows bent and your fists pointed at the ceiling. 5. Keeping your arms in the bent position, move them forward so your elbows and forearms meet in front of your face. 6. Open your arms back out as wide as you can with your elbows still bent, until you feel your shoulder blades squeezing together. Hold for 5 seconds. Slowly repeat the movements  forward and backward for one minute as tolerated. Contact a health care provider if you:  Had to stop exercising due to any of the following: ? Pain. ? Nausea. ? Shortness of breath. ? Dizziness. ? Fatigue.  Have significant pain or soreness after exercising. Get help right away if you have:  Chest pain.  Difficulty breathing. These symptoms may represent a serious problem that is an emergency. Do not wait to see if the symptoms will go away. Get medical help right away. Call your local emergency services (911 in the [place] Do not drive yourself to the hospital. This information is not intended to replace advice given to you by your health care provider. Make sure you discuss any questions you have with your health care provider. Document Revised: 08/06/2018 Document Reviewed: 02/26/2017 Elsevier Patient Education  2020 [employer].

## 2019-10-01 ENCOUNTER — Ambulatory Visit (INDEPENDENT_AMBULATORY_CARE_PROVIDER_SITE_OTHER): Payer: Medicare Other | Admitting: Family Medicine

## 2019-10-01 ENCOUNTER — Encounter: Payer: Self-pay | Admitting: Family Medicine

## 2019-10-01 ENCOUNTER — Other Ambulatory Visit: Payer: Self-pay

## 2019-10-01 VITALS — BP 148/78 | HR 72 | Temp 97.2°F | Resp 14 | Ht 66.0 in | Wt 213.2 lb

## 2019-10-01 DIAGNOSIS — E1142 Type 2 diabetes mellitus with diabetic polyneuropathy: Secondary | ICD-10-CM

## 2019-10-01 DIAGNOSIS — K219 Gastro-esophageal reflux disease without esophagitis: Secondary | ICD-10-CM

## 2019-10-01 DIAGNOSIS — I10 Essential (primary) hypertension: Secondary | ICD-10-CM

## 2019-10-01 DIAGNOSIS — E782 Mixed hyperlipidemia: Secondary | ICD-10-CM | POA: Diagnosis not present

## 2019-10-01 DIAGNOSIS — E6609 Other obesity due to excess calories: Secondary | ICD-10-CM

## 2019-10-01 DIAGNOSIS — I152 Hypertension secondary to endocrine disorders: Secondary | ICD-10-CM

## 2019-10-01 DIAGNOSIS — Z6834 Body mass index (BMI) 34.0-34.9, adult: Secondary | ICD-10-CM

## 2019-10-01 DIAGNOSIS — E1159 Type 2 diabetes mellitus with other circulatory complications: Secondary | ICD-10-CM

## 2019-10-01 HISTORY — DX: Hypertension secondary to endocrine disorders: I15.2

## 2019-10-01 HISTORY — DX: Type 2 diabetes mellitus with other circulatory complications: E11.59

## 2019-10-01 LAB — POCT UA - MICROALBUMIN: Microalbumin Ur, POC: 30 mg/L

## 2019-10-02 LAB — LIPID PANEL
Chol/HDL Ratio: 2.7 ratio (ref 0.0–4.4)
Cholesterol, Total: 110 mg/dL (ref 100–199)
HDL: 41 mg/dL (ref >39)
LDL Chol Calc (NIH): 47 mg/dL (ref 0–99)
Triglycerides: 120 mg/dL (ref 0–149)
VLDL Cholesterol Cal: 22 mg/dL (ref 5–40)

## 2019-10-02 LAB — CBC WITH DIFFERENTIAL/PLATELET
Basophils Absolute: 0.1 10*3/uL (ref 0.0–0.2)
Basos: 1 %
EOS (ABSOLUTE): 0.2 10*3/uL (ref 0.0–0.4)
Eos: 2 %
Hematocrit: 36.2 % (ref 34.0–46.6)
Hemoglobin: 11.7 g/dL (ref 11.1–15.9)
Immature Grans (Abs): 0 10*3/uL (ref 0.0–0.1)
Immature Granulocytes: 0 %
Lymphocytes Absolute: 2.6 10*3/uL (ref 0.7–3.1)
Lymphs: 39 %
MCH: 31.9 pg (ref 26.6–33.0)
MCHC: 32.3 g/dL (ref 31.5–35.7)
MCV: 99 fL — ABNORMAL HIGH (ref 79–97)
Monocytes Absolute: 0.7 10*3/uL (ref 0.1–0.9)
Monocytes: 10 %
Neutrophils Absolute: 3.2 10*3/uL (ref 1.4–7.0)
Neutrophils: 48 %
Platelets: 226 10*3/uL (ref 150–450)
RBC: 3.67 x10E6/uL — ABNORMAL LOW (ref 3.77–5.28)
RDW: 13.4 % (ref 11.7–15.4)
WBC: 6.7 10*3/uL (ref 3.4–10.8)

## 2019-10-02 LAB — COMPREHENSIVE METABOLIC PANEL
ALT: 29 IU/L (ref 0–32)
AST: 31 IU/L (ref 0–40)
Albumin/Globulin Ratio: 1.5 (ref 1.2–2.2)
Albumin: 4.3 g/dL (ref 3.8–4.8)
Alkaline Phosphatase: 62 IU/L (ref 48–121)
BUN/Creatinine Ratio: 18 (ref 12–28)
BUN: 17 mg/dL (ref 8–27)
Bilirubin Total: 0.4 mg/dL (ref 0.0–1.2)
CO2: 29 mmol/L (ref 20–29)
Calcium: 9.8 mg/dL (ref 8.7–10.3)
Chloride: 99 mmol/L (ref 96–106)
Creatinine, Ser: 0.97 mg/dL (ref 0.57–1.00)
GFR calc Af Amer: 69 mL/min/{1.73_m2} (ref >59)
GFR calc non Af Amer: 60 mL/min/{1.73_m2} (ref >59)
Globulin, Total: 2.8 g/dL (ref 1.5–4.5)
Glucose: 128 mg/dL — ABNORMAL HIGH (ref 65–99)
Potassium: 4.5 mmol/L (ref 3.5–5.2)
Sodium: 140 mmol/L (ref 134–144)
Total Protein: 7.1 g/dL (ref 6.0–8.5)

## 2019-10-02 LAB — CARDIOVASCULAR RISK ASSESSMENT

## 2019-10-02 LAB — HEMOGLOBIN A1C
Est. average glucose Bld gHb Est-mCnc: 143 mg/dL
Hgb A1c MFr Bld: 6.6 % — ABNORMAL HIGH (ref 4.8–5.6)

## 2019-10-04 ENCOUNTER — Other Ambulatory Visit: Payer: Self-pay

## 2019-10-04 DIAGNOSIS — E1142 Type 2 diabetes mellitus with diabetic polyneuropathy: Secondary | ICD-10-CM

## 2019-10-04 DIAGNOSIS — E782 Mixed hyperlipidemia: Secondary | ICD-10-CM

## 2019-10-04 DIAGNOSIS — I10 Essential (primary) hypertension: Secondary | ICD-10-CM

## 2019-10-04 NOTE — Progress Notes (Signed)
Patient had an appt on 09/30/2019 with Sherre Poot, PharmD.

## 2019-10-18 ENCOUNTER — Other Ambulatory Visit: Payer: Self-pay | Admitting: Family Medicine

## 2019-10-21 DIAGNOSIS — G4733 Obstructive sleep apnea (adult) (pediatric): Secondary | ICD-10-CM | POA: Diagnosis not present

## 2019-11-05 ENCOUNTER — Other Ambulatory Visit: Payer: Self-pay | Admitting: Family Medicine

## 2019-12-02 ENCOUNTER — Other Ambulatory Visit: Payer: Medicare Other

## 2019-12-02 ENCOUNTER — Encounter: Payer: Self-pay | Admitting: Gastroenterology

## 2019-12-02 ENCOUNTER — Other Ambulatory Visit: Payer: Self-pay

## 2019-12-02 ENCOUNTER — Ambulatory Visit (INDEPENDENT_AMBULATORY_CARE_PROVIDER_SITE_OTHER): Payer: Medicare Other | Admitting: Gastroenterology

## 2019-12-02 VITALS — BP 134/76 | HR 63 | Ht 66.0 in | Wt 213.1 lb

## 2019-12-02 DIAGNOSIS — N50819 Testicular pain, unspecified: Secondary | ICD-10-CM

## 2019-12-02 DIAGNOSIS — K625 Hemorrhage of anus and rectum: Secondary | ICD-10-CM

## 2019-12-02 DIAGNOSIS — R194 Change in bowel habit: Secondary | ICD-10-CM

## 2019-12-02 DIAGNOSIS — R112 Nausea with vomiting, unspecified: Secondary | ICD-10-CM

## 2019-12-02 DIAGNOSIS — R1013 Epigastric pain: Secondary | ICD-10-CM | POA: Diagnosis not present

## 2019-12-02 MED ORDER — DICYCLOMINE HCL 10 MG PO CAPS
10.0000 mg | ORAL_CAPSULE | Freq: Two times a day (BID) | ORAL | 0 refills | Status: DC
Start: 2019-12-02 — End: 2020-01-29

## 2019-12-02 NOTE — Patient Instructions (Addendum)
If you are age 68 or older, your body mass index should be between 23-30. Your Body mass index is 34.4 kg/m. If this is out of the aforementioned range listed, please consider follow up with your Primary Care Provider.  If you are age 24 or younger, your body mass index should be between 19-25. Your Body mass index is 34.4 kg/m. If this is out of the aformentioned range listed, please consider follow up with your Primary Care Provider.   You have been scheduled for an endoscopy. Please follow written instructions given to you at your visit today. If you use inhalers (even only as needed), please bring them with you on the day of your procedure.   Your provider has requested that you go to the basement level for lab work at [facility], [place], [place] . Press "B" on the elevator. The lab is located at the first door on the left as you exit the elevator.  STOP Magnesium.  Continue Prevacid.  We have sent the following medications to your pharmacy for you to pick up at your convenience: Bentyl 10 mg twice daily.  Thank you,  Dr. Jackquline Denmark

## 2019-12-02 NOTE — Progress Notes (Signed)
IMPRESSION and PLAN:    #1. Change in bowel habits in form of intermittent diarrhea.  Previous H/O constipation.  Combination of IBS/medications #2. Rectal bleeding (resolved) d/t hoids s/p Hoidectomy. Last colon 01/2018- small colonic polyp (TA) S/P polypectomy, mod sig diverticulosis. Next due 01/2023 #3. Epi pain with occ N/V. H/O GERD  Plan: -Stool studies for GI Pathogens, O&P, fecal elastase, fat and Calprotectin. -Stop Mg -Trial of Bentyl 10mg  po bid #60,  -Not keen on stopping metformin. -EGD with possible SB Bx for further evaluation. -Continue pravacid -If neg, [place] abdo if needed       HPI:    Chief Complaint:   Megan Galvan is a 69 y.o. female  For follow-up visit  BMs 4-7/day, mushy.  Used to be constipated.  Has stopped taking MiraLAX/Metamucil.  If he takes Imodium, she would not have a bowel movement for 24 hours.  No nocturnal symptoms.  No recent antibiotics or travel history. Does take magnesium which is in multivitamins.  No further rectal bleeding ever since she had hemorrhoidectomy.  Has been complaining of some epigastric discomfort with intermittent N/V.  Never had EGD.  Would like to get it done.  No odynophagia or dysphagia.  No weight loss.  Accompanied by her husband.   Past Medical History:  Diagnosis Date  . Arthritis   . Bone spur of acromioclavicular joint, left   . GERD (gastroesophageal reflux disease)   . Hyperlipidemia   . Hypertension   . Plantar fasciitis     Current Outpatient Medications  Medication Sig Dispense Refill  . Acetaminophen 500 MG coapsule 1 capsule as needed.    . Azilsartan Medoxomil 40 MG TABS Take 2 tablets by mouth daily.    Marland Kitchen CALCIUM CITRATE-VITAMIN D PO Take 1 tablet by mouth daily.    . cetirizine (ZYRTEC) 10 MG tablet Take 1 tablet by mouth daily.    . chlorthalidone (HYGROTON) 25 MG tablet Take 1 tablet by mouth daily.    . fluticasone (FLONASE) 50 MCG/ACT nasal spray Place 1 spray into the  nose as needed.    . lansoprazole (PREVACID) 30 MG capsule Take 1 capsule by mouth 2 (two) times daily.    . Melatonin 5 MG TABS Take 1 tablet by mouth at bedtime.    . metFORMIN (GLUCOPHAGE) 1000 MG tablet Take 2 tablets by mouth.    . metoprolol succinate (TOPROL-XL) 50 MG 24 hr tablet Take 1 tablet by mouth daily.    . Multiple Vitamin (MULTI-VITAMINS) TABS Take 1 tablet by mouth daily.    . potassium chloride SA (K-DUR,KLOR-CON) 20 MEQ tablet Take 3 tablets by mouth daily.    . pravastatin (PRAVACHOL) 40 MG tablet Take 1 tablet by mouth at bedtime.    Marland Kitchen PROCTOZONE-HC 2.5 % rectal cream APPLY A THIN LAYER TO THE AFFECTED AREA RECTALLY TWICE DAILY  0  . ranitidine (ZANTAC) 300 MG tablet Take 1 tablet by mouth at bedtime.    . traZODone (DESYREL) 100 MG tablet Take 1 tablet by mouth at bedtime.     No current facility-administered medications for this visit.     Past Surgical History:  Procedure Laterality Date  . BACK SURGERY  11/17/2016   L3-L5  . BILATERAL CARPAL TUNNEL RELEASE    . bone spur Left    left shoulder  . bone spur Right    Right shoulder  . COLONOSCOPY  08/22/2011   Moderate predominantly sigmoid diverticulosis. Small internal hemorrhoids.   Marland Kitchen  NECK SURGERY    . NECK SURGERY    . torn rotator cuff Right    Right shoulder  . torn rotator cuff Left    left shoulder  . TRIGGER FINGER RELEASE Left    thumb  . TRIGGER FINGER RELEASE Right    thumb and middle finger  . WRIST SURGERY Left    torn ligament  . WRIST SURGERY Right    cyst    Family History  Problem Relation Age of Onset  . Colon cancer Paternal Grandmother     Social History   Tobacco Use  . Smoking status: Never Smoker  . Smokeless tobacco: Never Used  Substance Use Topics  . Alcohol use: Never    Frequency: Never  . Drug use: Never    Allergies  Allergen Reactions  . Doxycycline     Rash   . Latex     Burn and itch     Review of Systems: All systems reviewed and negative  except where noted in HPI.    Physical Exam:     BP 128/72   Pulse 75   Ht 5\' 6"  (1.676 m)   Wt 210 lb 4 oz (95.4 kg)   BMI 33.94 kg/m  GENERAL:  Alert, oriented, cooperative, not in acute distress. PSYCH: :Pleasant, normal mood and affect. ABDOMEN: Inspection: No visible peristalsis, no abnormal pulsations, skin normal.  Palpation/percussion: Soft, nontender, nondistended, no rigidity, no abnormal dullness to percussion, no hepatosplenomegaly and no palpable abdominal masses.  Auscultation: Normal bowel sounds, no abdominal bruits. Pt's husband was present throughout the history taking and exam.    Lilyian Quayle,MD   CC Cox, Elnita Maxwell, MD

## 2019-12-06 ENCOUNTER — Other Ambulatory Visit: Payer: Medicare Other

## 2019-12-06 DIAGNOSIS — K625 Hemorrhage of anus and rectum: Secondary | ICD-10-CM | POA: Diagnosis not present

## 2019-12-06 DIAGNOSIS — N50819 Testicular pain, unspecified: Secondary | ICD-10-CM

## 2019-12-06 DIAGNOSIS — R194 Change in bowel habit: Secondary | ICD-10-CM | POA: Diagnosis not present

## 2019-12-07 ENCOUNTER — Other Ambulatory Visit: Payer: Self-pay

## 2019-12-07 ENCOUNTER — Ambulatory Visit (AMBULATORY_SURGERY_CENTER): Payer: Medicare Other | Admitting: Gastroenterology

## 2019-12-07 ENCOUNTER — Encounter: Payer: Self-pay | Admitting: Gastroenterology

## 2019-12-07 VITALS — BP 132/50 | HR 60 | Temp 97.7°F | Resp 16 | Ht 66.0 in | Wt 213.0 lb

## 2019-12-07 DIAGNOSIS — R1013 Epigastric pain: Secondary | ICD-10-CM | POA: Diagnosis not present

## 2019-12-07 DIAGNOSIS — K319 Disease of stomach and duodenum, unspecified: Secondary | ICD-10-CM | POA: Diagnosis not present

## 2019-12-07 DIAGNOSIS — K2970 Gastritis, unspecified, without bleeding: Secondary | ICD-10-CM

## 2019-12-07 DIAGNOSIS — K625 Hemorrhage of anus and rectum: Secondary | ICD-10-CM

## 2019-12-07 DIAGNOSIS — N50819 Testicular pain, unspecified: Secondary | ICD-10-CM

## 2019-12-07 DIAGNOSIS — K3189 Other diseases of stomach and duodenum: Secondary | ICD-10-CM | POA: Diagnosis not present

## 2019-12-07 MED ORDER — SODIUM CHLORIDE 0.9 % IV SOLN: 500.0000 mL | Freq: Once | INTRAVENOUS | Status: DC

## 2019-12-07 NOTE — Op Note (Signed)
[facility] Patient Name: Megan Galvan Procedure Date: 12/07/2019 9:58 AM MRN: [id number] Endoscopist: Jackquline Denmark , MD Age: 69 Referring MD:  Date of Birth: 06-11-1950 Gender: Female Account #: [id number] Procedure:                Upper GI endoscopy Indications:              Epigastric abdominal pain Medicines:                Monitored Anesthesia Care Procedure:                Pre-Anesthesia Assessment:                           - Prior to the procedure, a History and Physical                            was performed, and patient medications and                            allergies were reviewed. The patient's tolerance of                            previous anesthesia was also reviewed. The risks                            and benefits of the procedure and the sedation                            options and risks were discussed with the patient.                            All questions were answered, and informed consent                            was obtained. Prior Anticoagulants: The patient has                            taken no previous anticoagulant or antiplatelet                            agents. ASA Grade Assessment: II - A patient with                            mild systemic disease. After reviewing the risks                            and benefits, the patient was deemed in                            satisfactory condition to undergo the procedure.                           After obtaining informed consent, the endoscope was  passed under direct vision. Throughout the                            procedure, the patient's blood pressure, pulse, and                            oxygen saturations were monitored continuously. The                            Endoscope was introduced through the mouth, and                            advanced to the second part of duodenum. The upper                            GI endoscopy was accomplished  without difficulty.                            The patient tolerated the procedure well. Scope In: Scope Out: Findings:                 The examined esophagus was normal with well-defined                            Z-line at 38 cm from incisors. Examined by NBI.                           Localized minimal inflammation characterized by                            erythema was found in the gastric antrum. Biopsies                            were taken with a cold forceps for histology.                           The examined duodenum was normal. Biopsies for                            histology were taken with a cold forceps for                            evaluation of celiac disease. Complications:            No immediate complications. Estimated Blood Loss:     Estimated blood loss: none. Impression:               -Minimal gastritis Recommendation:           - Patient has a contact number available for                            emergencies. The signs and symptoms of potential                            delayed complications were  discussed with the                            patient. Return to normal activities tomorrow.                            Written discharge instructions were provided to the                            patient.                           - Resume previous diet.                           - Continue Prevacid 30 mg p.o. once a day                           - Await pathology results.                           - Return to GI clinic PRN.                           -Discussed with patient's husband Brunilda Payor, MD 12/07/2019 10:21:40 AM This report has been signed electronically.

## 2019-12-07 NOTE — Patient Instructions (Signed)
YOU HAD AN ENDOSCOPIC PROCEDURE TODAY AT THE [facility]:   Refer to the procedure report that was given to you for any specific questions about what was found during the examination.  If the procedure report does not answer your questions, please call your gastroenterologist to clarify.  If you requested that your care partner not be given the details of your procedure findings, then the procedure report has been included in a sealed envelope for you to review at your convenience later.  YOU SHOULD EXPECT: Some feelings of bloating in the abdomen. Passage of more gas than usual.  Walking can help get rid of the air that was put into your GI tract during the procedure and reduce the bloating. If you had a lower endoscopy (such as a colonoscopy or flexible sigmoidoscopy) you may notice spotting of blood in your stool or on the toilet paper. If you underwent a bowel prep for your procedure, you may not have a normal bowel movement for a few days.  Please Note:  You might notice some irritation and congestion in your nose or some drainage.  This is from the oxygen used during your procedure.  There is no need for concern and it should clear up in a day or so.  SYMPTOMS TO REPORT IMMEDIATELY:    Following upper endoscopy (EGD)  Vomiting of blood or coffee ground material  New chest pain or pain under the shoulder blades  Painful or persistently difficult swallowing  New shortness of breath  Fever of 100F or higher  Black, tarry-looking stools  For urgent or emergent issues, a gastroenterologist can be reached at any hour by calling [phone]. Do not use MyChart messaging for urgent concerns.    DIET:  We do recommend a small meal at first, but then you may proceed to your regular diet.  Drink plenty of fluids but you should avoid alcoholic beverages for 24 hours.  ACTIVITY:  You should plan to take it easy for the rest of today and you should NOT DRIVE or use heavy machinery  until tomorrow (because of the sedation medicines used during the test).    FOLLOW UP: Our staff will call the number listed on your records 48-72 hours following your procedure to check on you and address any questions or concerns that you may have regarding the information given to you following your procedure. If we do not reach you, we will leave a message.  We will attempt to reach you two times.  During this call, we will ask if you have developed any symptoms of COVID 19. If you develop any symptoms (ie: fever, flu-like symptoms, shortness of breath, cough etc.) before then, please call [phone].  If you test positive for Covid 19 in the 2 weeks post procedure, please call and report this information to us.    If any biopsies were taken you will be contacted by phone or by letter within the next 1-3 weeks.  Please call us at [phone] if you have not heard about the biopsies in 3 weeks.    SIGNATURES/CONFIDENTIALITY: You and/or your care partner have signed paperwork which will be entered into your electronic medical record.  These signatures attest to the fact that that the information above on your After Visit Summary has been reviewed and is understood.  Full responsibility of the confidentiality of this discharge information lies with you and/or your care-partner. 

## 2019-12-07 NOTE — Progress Notes (Signed)
Called to room to assist during endoscopic procedure.  Patient ID and intended procedure confirmed with present staff. Received instructions for my participation in the procedure from the performing physician.  

## 2019-12-07 NOTE — Progress Notes (Signed)
VS-CW 

## 2019-12-07 NOTE — Progress Notes (Signed)
PT taken to PACU. Monitors in place. VSS. Report given to RN. 

## 2019-12-08 LAB — GI PROFILE, STOOL, PCR

## 2019-12-09 ENCOUNTER — Telehealth: Payer: Self-pay | Admitting: *Deleted

## 2019-12-09 NOTE — Telephone Encounter (Signed)
  Follow up Call-  Call back number 12/07/2019 02/24/2018  Post procedure Call Back phone  # [phone]  Permission to leave phone message Yes Yes  Some recent data might be hidden     Patient questions:  Do you have a fever, pain , or abdominal swelling? No. Pain Score  0 *  Have you tolerated food without any problems? Yes.    Have you been able to return to your normal activities? Yes.    Do you have any questions about your discharge instructions: Diet   No. Medications  No. Follow up visit  No.  Do you have questions or concerns about your Care? No.  Actions: * If pain score is 4 or above: No action needed, pain <4.  1. Have you developed a fever since your procedure? no  2.   Have you had an respiratory symptoms (SOB or cough) since your procedure? no  3.   Have you tested positive for COVID 19 since your procedure no  4.   Have you had any family members/close contacts diagnosed with the COVID 19 since your procedure?  no   If yes to any of these questions please route to Joylene John, RN and Joella Prince, RN

## 2019-12-14 ENCOUNTER — Encounter: Payer: Self-pay | Admitting: Gastroenterology

## 2019-12-14 ENCOUNTER — Telehealth: Payer: Self-pay | Admitting: Gastroenterology

## 2019-12-14 NOTE — Telephone Encounter (Signed)
Spoke to patient to inform her that Dr Lyndel Safe was out of the office this week but her labs appeared to be with in normal limits. We will contact her next week once Dr Lyndel Safe reviews her labs and pathology. Patient voiced understanding

## 2019-12-15 ENCOUNTER — Telehealth: Payer: Self-pay | Admitting: Gastroenterology

## 2019-12-15 NOTE — Telephone Encounter (Signed)
Megan Galvan with [facility] calling, they need clarification on code for labs from 12/06/19. She stated that order was sent with code N50.819 that code is for female patients. Pls call [facility], the ref # [id number].

## 2019-12-15 NOTE — Telephone Encounter (Signed)
Spoke to a [employer] who states that code N50.819 indicates a female patient. That code was removed from the order,and the other 2 DX codes were kept as the same.

## 2019-12-22 LAB — OVA AND PARASITE EXAMINATION
CONCENTRATE RESULT:: NONE SEEN
MICRO NUMBER:: 10803134
SPECIMEN QUALITY:: ADEQUATE
TRICHROME RESULT:: NONE SEEN

## 2019-12-22 LAB — FECAL FAT, QUANTITATIVE: Specimen Total Weight: 22 g

## 2019-12-22 LAB — PANCREATIC ELASTASE, FECAL: Pancreatic Elastase-1, Stool: >500 mcg/g

## 2019-12-22 LAB — CALPROTECTIN: Calprotectin: 87 mcg/g

## 2020-01-28 ENCOUNTER — Other Ambulatory Visit: Payer: Self-pay

## 2020-01-28 ENCOUNTER — Ambulatory Visit (INDEPENDENT_AMBULATORY_CARE_PROVIDER_SITE_OTHER): Payer: Medicare Other | Admitting: Family Medicine

## 2020-01-28 VITALS — BP 148/60 | HR 72 | Temp 96.3°F | Resp 16 | Ht 66.0 in | Wt 213.4 lb

## 2020-01-28 DIAGNOSIS — Z6834 Body mass index (BMI) 34.0-34.9, adult: Secondary | ICD-10-CM

## 2020-01-28 DIAGNOSIS — E1142 Type 2 diabetes mellitus with diabetic polyneuropathy: Secondary | ICD-10-CM | POA: Diagnosis not present

## 2020-01-28 DIAGNOSIS — I10 Essential (primary) hypertension: Secondary | ICD-10-CM | POA: Diagnosis not present

## 2020-01-28 DIAGNOSIS — E6609 Other obesity due to excess calories: Secondary | ICD-10-CM | POA: Diagnosis not present

## 2020-01-28 DIAGNOSIS — E782 Mixed hyperlipidemia: Secondary | ICD-10-CM

## 2020-01-28 DIAGNOSIS — K219 Gastro-esophageal reflux disease without esophagitis: Secondary | ICD-10-CM | POA: Diagnosis not present

## 2020-01-28 MED ORDER — CHLORTHALIDONE 50 MG PO TABS: 50.0000 mg | ORAL_TABLET | Freq: Every day | ORAL | 1 refills | Status: DC

## 2020-01-28 NOTE — Progress Notes (Signed)
Subjective:  Patient ID: Megan Galvan, female    DOB: May 08, 1950  Age: 69 y.o. MRN: [id number]  Chief Complaint  Patient presents with  . Diabetes    HPI   Megan Galvan presents for Diabetes She presents for her follow-up diabetic visit. She has type 2 diabetes mellitus. Pertinent negatives for diabetes include no polydipsia, no polyphagia and no polyuria. There are no hypoglycemic complications. Diabetic complications include peripheral neuropathy. Risk factors for coronary artery disease include dyslipidemia. Current diabetic treatment includes diet and oral agent (monotherapy) (Metformin). She is compliant with treatment all of the time. She is following a diabetic diet most of the time.. Patient is not exercising. Her fasting blood glucose range is generally 130-140 mg/dl. An ACE inhibitor/angiotensin II receptor blocker is being taken. Eye exam is scheduled. Her last one was 10/2018. Marland Kitchen Hyperlipidemia This is a chronic problem. The current episode started more than 1 year ago. The problem is controlled.  Pertinent negatives include no chest pain, myalgias or shortness of breath. Current antihyperlipidemic treatment includes statins (pravastatin). Compliance problems include adherence to exercise.    Hypertension This is a chronic problem. The current episode started more than 1 year ago. The problem is well controlled. Pertinent negatives include no chest pain, headaches or shortness of breath. Currently on valsartan 320 mg once daily, chlorthalidone 25 mg once daily, and metoprolol xl 50 mg 1/2 pill daily.    Gastroesophageal Reflux: Long standing problem that is well controlled. She reports no abdominal pain, no chest pain, no coughing, no nausea or no sore throat. This is a chronic problem. She  Is on omeprazole and pepcid for the symptoms. The treatment provided significant relief.   Current Outpatient Medications on File Prior to Visit  Medication Sig Dispense Refill  .  ACCU-CHEK AVIVA PLUS test strip CHECK BLOOD SUGAR ONCE  DAILY 100 strip 3  . acetaminophen (TYLENOL) 500 MG tablet 500 mg as needed.     . ANUSOL-HC 2.5 % rectal cream Place 1 application rectally as needed.     Marland Kitchen aspirin EC 81 MG tablet Take 81 mg by mouth daily.    . cetirizine (ZYRTEC) 10 MG tablet Take 1 tablet by mouth daily.    . famotidine (PEPCID) 40 MG tablet TAKE 1 TABLET BY MOUTH AT  BEDTIME 90 tablet 3  . fluticasone (FLONASE) 50 MCG/ACT nasal spray Place 1 spray into the nose as needed.    . metFORMIN (GLUCOPHAGE) 1000 MG tablet TAKE 1 TABLET BY MOUTH ONCE DAILY WITH MORNING MEAL (Patient taking differently: Take 1,000 mg by mouth at bedtime. ) 90 tablet 3  . metoprolol succinate (TOPROL-XL) 50 MG 24 hr tablet TAKE ONE-HALF TABLET BY  MOUTH DAILY 130 tablet 0  . potassium chloride SA (KLOR-CON) 20 MEQ tablet TAKE 2 TABLETS BY MOUTH IN  THE MORNING AND 1 TABLET IN THE EVENING 270 tablet 3  . pravastatin (PRAVACHOL) 40 MG tablet TAKE 1 TABLET BY MOUTH AT  BEDTIME 90 tablet 3  . traZODone (DESYREL) 100 MG tablet TAKE 1 TABLET BY MOUTH  BEFORE BEDTIME 90 tablet 3  . valsartan (DIOVAN) 320 MG tablet TAKE 1 TABLET BY MOUTH ONCE DAILY 90 tablet 3   No current facility-administered medications on file prior to visit.   Past Medical History:  Diagnosis Date  . Allergy   . Anemia   . Arthritis   . Bone spur of acromioclavicular joint, left   . Cataract   . Diabetes mellitus without complication (  HCC)   . GERD (gastroesophageal reflux disease)   . Heart murmur   . Hyperlipidemia   . Hypertension   . Nonrheumatic aortic (valve) stenosis   . Plantar fasciitis   . Sleep apnea    cpap   Past Surgical History:  Procedure Laterality Date  . BACK SURGERY  11/17/2016   L3-L5  . BILATERAL CARPAL TUNNEL RELEASE    . bone spur Left    left shoulder  . bone spur Right    Right shoulder  . COLONOSCOPY  08/22/2011   Moderate predominantly sigmoid diverticulosis. Small internal  hemorrhoids.   Marland Kitchen HEMORROIDECTOMY  02/2019  . NECK SURGERY    . NECK SURGERY    . torn rotator cuff Right    Right shoulder  . torn rotator cuff Left    left shoulder  . TRIGGER FINGER RELEASE Left    thumb  . TRIGGER FINGER RELEASE Right    thumb and middle finger  . UPPER GASTROINTESTINAL ENDOSCOPY    . WRIST SURGERY Left    torn ligament  . WRIST SURGERY Right    cyst    Family History  Problem Relation Age of Onset  . Colon cancer Paternal Grandmother   . Esophageal cancer Neg Hx   . Rectal cancer Neg Hx   . Stomach cancer Neg Hx    Social History   Socioeconomic History  . Marital status: Married    Spouse name: Not on file  . Number of children: 2  . Years of education: Not on file  . Highest education level: Not on file  Occupational History  . Not on file  Tobacco Use  . Smoking status: Never Smoker  . Smokeless tobacco: Never Used  Vaping Use  . Vaping Use: Never used  Substance and Sexual Activity  . Alcohol use: Never  . Drug use: Never  . Sexual activity: Not on file  Other Topics Concern  . Not on file  Social History Narrative  . Not on file   Social Determinants of Health   Financial Resource Strain:   . Difficulty of Paying Living Expenses: Not on file  Food Insecurity: No Food Insecurity  . Worried About Charity fundraiser in the Last Year: Never true  . Ran Out of Food in the Last Year: Never true  Transportation Needs: No Transportation Needs  . Lack of Transportation (Medical): No  . Lack of Transportation (Non-Medical): No  Physical Activity:   . Days of Exercise per Week: Not on file  . Minutes of Exercise per Session: Not on file  Stress:   . Feeling of Stress : Not on file  Social Connections:   . Frequency of Communication with Friends and Family: Not on file  . Frequency of Social Gatherings with Friends and Family: Not on file  . Attends Religious Services: Not on file  . Active Member of Clubs or Organizations: Not on  file  . Attends Archivist Meetings: Not on file  . Marital Status: Not on file    Review of Systems  Constitutional: Negative for chills, fatigue and fever.  HENT: Negative for congestion, rhinorrhea and sore throat.   Eyes: Positive for visual disturbance (going to schedule exam exam ).  Respiratory: Negative for cough and shortness of breath.   Cardiovascular: Negative for chest pain.  Gastrointestinal: Positive for constipation. Negative for abdominal pain, diarrhea, nausea and vomiting.  Endocrine: Negative for polydipsia, polyphagia and polyuria.  Genitourinary: Positive for vaginal  pain (irritation.). Negative for dysuria and urgency.  Musculoskeletal: Positive for myalgias. Negative for arthralgias and back pain.  Neurological: Positive for numbness. Negative for dizziness, weakness, light-headedness and headaches.  Psychiatric/Behavioral: Negative for dysphoric mood. The patient is not nervous/anxious.      Objective:  BP (!) 148/60   Pulse 72   Temp (!) 96.3 F (35.7 C)   Resp 16   Ht 5\' 6"  (1.676 m)   Wt 213 lb 6.4 oz (96.8 kg)   BMI 34.44 kg/m   BP/Weight 01/28/2020 06/12/863 11/04/4694  Systolic BP 295 284 132  Diastolic BP 60 50 76  Wt. (Lbs) 213.4 213 213.13  BMI 34.44 34.38 34.4    Physical Exam Vitals reviewed.  Constitutional:      Appearance: Normal appearance. She is normal weight.  Cardiovascular:     Rate and Rhythm: Normal rate and regular rhythm.     Pulses: Normal pulses.     Heart sounds: Normal heart sounds.  Pulmonary:     Effort: Pulmonary effort is normal. No respiratory distress.     Breath sounds: Normal breath sounds.  Abdominal:     General: Abdomen is flat. Bowel sounds are normal.     Palpations: Abdomen is soft.     Tenderness: There is no abdominal tenderness.  Neurological:     Mental Status: She is alert and oriented to person, place, and time.  Psychiatric:        Mood and Affect: Mood normal.        Behavior:  Behavior normal.     Diabetic Foot Exam - Simple   Simple Foot Form Diabetic Foot exam was performed with the following findings: Yes 01/28/2020 11:00 AM  Visual Inspection See comments: Yes Sensation Testing See comments: Yes Pulse Check Posterior Tibialis and Dorsalis pulse intact bilaterally: Yes Comments Mild decreased sensation. Dry skin.      Lab Results  Component Value Date   WBC 6.3 01/28/2020   HGB 11.9 01/28/2020   HCT 35.9 01/28/2020   PLT 211 01/28/2020   GLUCOSE 128 (H) 01/28/2020   CHOL 113 01/28/2020   TRIG 107 01/28/2020   HDL 40 01/28/2020   LDLCALC 53 01/28/2020   ALT 30 01/28/2020   AST 38 01/28/2020   NA 139 01/28/2020   K 4.1 01/28/2020   CL 98 01/28/2020   CREATININE 0.82 01/28/2020   BUN 14 01/28/2020   CO2 28 01/28/2020   HGBA1C 6.9 (H) 01/28/2020   MICROALBUR 30 10/01/2019      Assessment & Plan:   1. Type 2 diabetes mellitus with diabetic polyneuropathy, without long-term current use of insulin (HCC) Control: good Recommend check sugars fasting daily. Recommend check feet daily. Recommend annual eye exams. Medicines: no changes Continue to work on eating a healthy diet and exercise.  Labs drawn today.   - CBC with Differential/Platelet - Comprehensive metabolic panel - Hemoglobin A1c  2. Mixed hyperlipidemia Well controlled.  No changes to medicines.  Continue to work on eating a healthy diet and exercise.  Labs drawn today.  - CBC with Differential/Platelet - Comprehensive metabolic panel - Lipid panel  3. Class 1 obesity due to excess calories with serious comorbidity and body mass index (BMI) of 34.0 to 34.9 in adult Recommend continue to work on eating healthy diet and exercise. Recommend weight loss.  4. Essential hypertension Uncontrolled. Increase chlorthalidone to 50 mg once daily. Check bp daily and if still up in a couple of weeks, call.  Continue to  work on eating a healthy diet and exercise.  Labs drawn  today.   5. Gastro-esophageal reflux disease without esophagitis  The current medical regimen is effective;  continue present plan and medications.  Meds ordered this encounter  Medications  . chlorthalidone (HYGROTON) 50 MG tablet    Sig: Take 1 tablet (50 mg total) by mouth daily.    Dispense:  90 tablet    Refill:  1  . omeprazole (PRILOSEC) 40 MG capsule    Sig: Take 1 capsule (40 mg total) by mouth daily.    Dispense:  90 capsule    Refill:  1    Requesting 1 year supply  . conjugated estrogens (PREMARIN) vaginal cream    Sig: Place 1 Applicatorful vaginally 2 (two) times a week.    Dispense:  42.5 g    Refill:  2    Orders Placed This Encounter  Procedures  . CBC with Differential/Platelet  . Comprehensive metabolic panel  . Hemoglobin A1c  . Lipid panel  . Cardiovascular Risk Assessment     Follow-up: Return in about 3 months (around 05/09/2020) for fasting.  An After Visit Summary was printed and given to the patient.  Rochel Brome [facility] [phone]

## 2020-01-29 ENCOUNTER — Encounter: Payer: Self-pay | Admitting: Family Medicine

## 2020-01-29 LAB — COMPREHENSIVE METABOLIC PANEL
ALT: 30 IU/L (ref 0–32)
AST: 38 IU/L (ref 0–40)
Albumin/Globulin Ratio: 1.6 (ref 1.2–2.2)
Albumin: 4.4 g/dL (ref 3.8–4.8)
Alkaline Phosphatase: 59 IU/L (ref 44–121)
BUN/Creatinine Ratio: 17 (ref 12–28)
BUN: 14 mg/dL (ref 8–27)
Bilirubin Total: 0.5 mg/dL (ref 0.0–1.2)
CO2: 28 mmol/L (ref 20–29)
Calcium: 9.6 mg/dL (ref 8.7–10.3)
Chloride: 98 mmol/L (ref 96–106)
Creatinine, Ser: 0.82 mg/dL (ref 0.57–1.00)
GFR calc Af Amer: 85 mL/min/{1.73_m2} (ref >59)
GFR calc non Af Amer: 74 mL/min/{1.73_m2} (ref >59)
Globulin, Total: 2.7 g/dL (ref 1.5–4.5)
Glucose: 128 mg/dL — ABNORMAL HIGH (ref 65–99)
Potassium: 4.1 mmol/L (ref 3.5–5.2)
Sodium: 139 mmol/L (ref 134–144)
Total Protein: 7.1 g/dL (ref 6.0–8.5)

## 2020-01-29 LAB — HEMOGLOBIN A1C
Est. average glucose Bld gHb Est-mCnc: 151 mg/dL
Hgb A1c MFr Bld: 6.9 % — ABNORMAL HIGH (ref 4.8–5.6)

## 2020-01-29 LAB — CBC WITH DIFFERENTIAL/PLATELET
Basophils Absolute: 0.1 10*3/uL (ref 0.0–0.2)
Basos: 1 %
EOS (ABSOLUTE): 0.1 10*3/uL (ref 0.0–0.4)
Eos: 2 %
Hematocrit: 35.9 % (ref 34.0–46.6)
Hemoglobin: 11.9 g/dL (ref 11.1–15.9)
Immature Grans (Abs): 0 10*3/uL (ref 0.0–0.1)
Immature Granulocytes: 0 %
Lymphocytes Absolute: 2.3 10*3/uL (ref 0.7–3.1)
Lymphs: 37 %
MCH: 32.2 pg (ref 26.6–33.0)
MCHC: 33.1 g/dL (ref 31.5–35.7)
MCV: 97 fL (ref 79–97)
Monocytes Absolute: 0.6 10*3/uL (ref 0.1–0.9)
Monocytes: 10 %
Neutrophils Absolute: 3.2 10*3/uL (ref 1.4–7.0)
Neutrophils: 50 %
Platelets: 211 10*3/uL (ref 150–450)
RBC: 3.69 x10E6/uL — ABNORMAL LOW (ref 3.77–5.28)
RDW: 13 % (ref 11.7–15.4)
WBC: 6.3 10*3/uL (ref 3.4–10.8)

## 2020-01-29 LAB — LIPID PANEL
Chol/HDL Ratio: 2.8 ratio (ref 0.0–4.4)
Cholesterol, Total: 113 mg/dL (ref 100–199)
HDL: 40 mg/dL (ref >39)
LDL Chol Calc (NIH): 53 mg/dL (ref 0–99)
Triglycerides: 107 mg/dL (ref 0–149)
VLDL Cholesterol Cal: 20 mg/dL (ref 5–40)

## 2020-01-29 LAB — CARDIOVASCULAR RISK ASSESSMENT

## 2020-01-29 MED ORDER — PREMARIN 0.625 MG/GM VA CREA: 1.0000 | TOPICAL_CREAM | VAGINAL | 2 refills | Status: DC

## 2020-01-29 MED ORDER — OMEPRAZOLE 40 MG PO CPDR
40.0000 mg | DELAYED_RELEASE_CAPSULE | Freq: Every day | ORAL | 1 refills | Status: DC
Start: 2020-01-29 — End: 2020-11-16

## 2020-01-31 ENCOUNTER — Telehealth: Payer: Self-pay

## 2020-01-31 NOTE — Telephone Encounter (Signed)
Left detailed message with normal lab results.

## 2020-02-03 ENCOUNTER — Telehealth: Payer: Self-pay

## 2020-02-03 DIAGNOSIS — G4733 Obstructive sleep apnea (adult) (pediatric): Secondary | ICD-10-CM | POA: Diagnosis not present

## 2020-02-03 NOTE — Chronic Care Management (AMB) (Signed)
Patient is using Premarin cream and it is becoming cost prohibitive. Pharmacist discussed options with patient and reviewed insurance formulary. Referred patient to contact [facility] to determine eligibility to receive product from manufacturer.   Patient acknowledged understanding and plans to call [facility] [phone]).    Sherre Poot, PharmD, [facility] Clinical Pharmacist [facility] [phone] (office) [phone] (mobile)

## 2020-02-10 ENCOUNTER — Other Ambulatory Visit: Payer: Self-pay | Admitting: Family Medicine

## 2020-02-23 ENCOUNTER — Encounter: Payer: Self-pay | Admitting: Family Medicine

## 2020-02-23 ENCOUNTER — Ambulatory Visit (INDEPENDENT_AMBULATORY_CARE_PROVIDER_SITE_OTHER): Payer: Medicare Other | Admitting: Family Medicine

## 2020-02-23 ENCOUNTER — Other Ambulatory Visit: Payer: Self-pay

## 2020-02-23 VITALS — BP 122/66 | HR 70 | Temp 93.6°F | Ht 66.0 in | Wt 210.0 lb

## 2020-02-23 DIAGNOSIS — K641 Second degree hemorrhoids: Secondary | ICD-10-CM | POA: Diagnosis not present

## 2020-02-23 DIAGNOSIS — B3789 Other sites of candidiasis: Secondary | ICD-10-CM

## 2020-02-23 MED ORDER — FLUCONAZOLE 150 MG PO TABS: 150.0000 mg | ORAL_TABLET | Freq: Every day | ORAL | 0 refills | Status: AC

## 2020-02-23 MED ORDER — HYDROCORTISONE ACETATE 25 MG RE SUPP: 25.0000 mg | Freq: Two times a day (BID) | RECTAL | 1 refills | Status: DC

## 2020-02-23 NOTE — Progress Notes (Signed)
Acute Office Visit  Subjective:    Patient ID: Megan Galvan, female    DOB: 05-18-1950, 69 y.o.   MRN: [id number]  Chief Complaint  Patient presents with  . Hemorrhoids    HPI Patient is in today for hemorrhoids. States this issue comes and goes however her flare up this time started 2 days ago. She has been applying an ointment which has not helped much. She also had been taking metamucil which does help some. Reports having constipation on Monday and has noticed blood on the toilet tissue after wiping but not in the toilet. Hemorrhoids very painful.   Past Medical History:  Diagnosis Date  . Allergy   . Anemia   . Arthritis   . Bone spur of acromioclavicular joint, left   . Cataract   . Diabetes mellitus without complication ([place])   . GERD (gastroesophageal reflux disease)   . Heart murmur   . Hyperlipidemia   . Hypertension   . Nonrheumatic aortic (valve) stenosis   . Plantar fasciitis   . Sleep apnea    cpap    Past Surgical History:  Procedure Laterality Date  . BACK SURGERY  11/17/2016   L3-L5  . BILATERAL CARPAL TUNNEL RELEASE    . bone spur Left    left shoulder  . bone spur Right    Right shoulder  . COLONOSCOPY  08/22/2011   Moderate predominantly sigmoid diverticulosis. Small internal hemorrhoids.   Marland Kitchen HEMORROIDECTOMY  02/2019  . NECK SURGERY    . NECK SURGERY    . torn rotator cuff Right    Right shoulder  . torn rotator cuff Left    left shoulder  . TRIGGER FINGER RELEASE Left    thumb  . TRIGGER FINGER RELEASE Right    thumb and middle finger  . UPPER GASTROINTESTINAL ENDOSCOPY    . WRIST SURGERY Left    torn ligament  . WRIST SURGERY Right    cyst    Family History  Problem Relation Age of Onset  . Colon cancer Paternal Grandmother   . Esophageal cancer Neg Hx   . Rectal cancer Neg Hx   . Stomach cancer Neg Hx     Social History   Socioeconomic History  . Marital status: Married    Spouse name: Not on file  . Number of  children: 2  . Years of education: Not on file  . Highest education level: Not on file  Occupational History  . Not on file  Tobacco Use  . Smoking status: Never Smoker  . Smokeless tobacco: Never Used  Vaping Use  . Vaping Use: Never used  Substance and Sexual Activity  . Alcohol use: Never  . Drug use: Never  . Sexual activity: Not on file  Other Topics Concern  . Not on file  Social History Narrative  . Not on file   Social Determinants of Health   Financial Resource Strain:   . Difficulty of Paying Living Expenses: Not on file  Food Insecurity: No Food Insecurity  . Worried About Charity fundraiser in the Last Year: Never true  . Ran Out of Food in the Last Year: Never true  Transportation Needs: No Transportation Needs  . Lack of Transportation (Medical): No  . Lack of Transportation (Non-Medical): No  Physical Activity:   . Days of Exercise per Week: Not on file  . Minutes of Exercise per Session: Not on file  Stress:   . Feeling of Stress :  Not on file  Social Connections:   . Frequency of Communication with Friends and Family: Not on file  . Frequency of Social Gatherings with Friends and Family: Not on file  . Attends Religious Services: Not on file  . Active Member of Clubs or Organizations: Not on file  . Attends Archivist Meetings: Not on file  . Marital Status: Not on file  Intimate Partner Violence:   . Fear of Current or Ex-Partner: Not on file  . Emotionally Abused: Not on file  . Physically Abused: Not on file  . Sexually Abused: Not on file    Outpatient Medications Prior to Visit  Medication Sig Dispense Refill  . ACCU-CHEK AVIVA PLUS test strip CHECK BLOOD SUGAR ONCE  DAILY 100 strip 3  . acetaminophen (TYLENOL) 500 MG tablet 500 mg as needed.     . ANUSOL-HC 2.5 % rectal cream Place 1 application rectally as needed.     Marland Kitchen aspirin EC 81 MG tablet Take 81 mg by mouth daily.    . cetirizine (ZYRTEC) 10 MG tablet Take 1 tablet by  mouth daily.    . chlorthalidone (HYGROTON) 50 MG tablet Take 1 tablet (50 mg total) by mouth daily. (Patient taking differently: Take 50 mg by mouth in the morning and at bedtime. ) 90 tablet 1  . conjugated estrogens (PREMARIN) vaginal cream Place 1 Applicatorful vaginally 2 (two) times a week. 42.5 g 2  . famotidine (PEPCID) 40 MG tablet TAKE 1 TABLET BY MOUTH AT  BEDTIME 90 tablet 3  . fluticasone (FLONASE) 50 MCG/ACT nasal spray Place 1 spray into the nose as needed.    . metFORMIN (GLUCOPHAGE) 1000 MG tablet TAKE 1 TABLET BY MOUTH ONCE DAILY WITH MORNING MEAL (Patient taking differently: Take 1,000 mg by mouth at bedtime. ) 90 tablet 3  . metoprolol succinate (TOPROL-XL) 50 MG 24 hr tablet TAKE ONE-HALF TABLET BY  MOUTH DAILY 45 tablet 3  . omeprazole (PRILOSEC) 40 MG capsule Take 1 capsule (40 mg total) by mouth daily. 90 capsule 1  . potassium chloride SA (KLOR-CON) 20 MEQ tablet TAKE 2 TABLETS BY MOUTH IN  THE MORNING AND 1 TABLET IN THE EVENING 270 tablet 3  . pravastatin (PRAVACHOL) 40 MG tablet TAKE 1 TABLET BY MOUTH AT  BEDTIME 90 tablet 3  . traZODone (DESYREL) 100 MG tablet TAKE 1 TABLET BY MOUTH  BEFORE BEDTIME 90 tablet 3  . valsartan (DIOVAN) 320 MG tablet TAKE 1 TABLET BY MOUTH ONCE DAILY 90 tablet 3   No facility-administered medications prior to visit.    Allergies  Allergen Reactions  . Ace Inhibitors Cough  . Doxycycline     Rash   . Keflex [Cephalexin]   . Latex     Burn and itch  . Nexium [Esomeprazole Magnesium] Diarrhea  . Protonix [Pantoprazole Sodium] Other (See Comments)    Constipation    Review of Systems  Constitutional: Negative for chills and fatigue.  HENT: Negative for congestion and sore throat.   Respiratory: Negative for cough.   Cardiovascular: Negative for chest pain.  Gastrointestinal: Positive for abdominal pain, constipation and rectal pain. Negative for diarrhea.       Objective:    Physical Exam Vitals reviewed.  Constitutional:       Appearance: Normal appearance. She is obese.  Skin:    Findings: Erythema (around anus. Unable to do rectal exam due to pain. ) present.  Neurological:     Mental Status: She is alert.  BP 122/66   Pulse 70   Temp (!) 93.6 F (34.2 C)   Ht 5\' 6"  (1.676 m)   Wt 210 lb (95.3 kg)   SpO2 100%   BMI 33.89 kg/m  Wt Readings from Last 3 Encounters:  02/23/20 210 lb (95.3 kg)  01/28/20 213 lb 6.4 oz (96.8 kg)  12/07/19 213 lb (96.6 kg)    Health Maintenance Due  Topic Date Due  . Hepatitis C Screening  Never done  . OPHTHALMOLOGY EXAM  Never done  . PNA vac Low Risk Adult (2 of 2 - PPSV23) 04/13/2017    There are no preventive care reminders to display for this patient.   No results found for: TSH Lab Results  Component Value Date   WBC 6.3 01/28/2020   HGB 11.9 01/28/2020   HCT 35.9 01/28/2020   MCV 97 01/28/2020   PLT 211 01/28/2020   Lab Results  Component Value Date   NA 139 01/28/2020   K 4.1 01/28/2020   CO2 28 01/28/2020   GLUCOSE 128 (H) 01/28/2020   BUN 14 01/28/2020   CREATININE 0.82 01/28/2020   BILITOT 0.5 01/28/2020   ALKPHOS 59 01/28/2020   AST 38 01/28/2020   ALT 30 01/28/2020   PROT 7.1 01/28/2020   ALBUMIN 4.4 01/28/2020   CALCIUM 9.6 01/28/2020   Lab Results  Component Value Date   CHOL 113 01/28/2020   Lab Results  Component Value Date   HDL 40 01/28/2020   Lab Results  Component Value Date   LDLCALC 53 01/28/2020   Lab Results  Component Value Date   TRIG 107 01/28/2020   Lab Results  Component Value Date   CHOLHDL 2.8 01/28/2020   Lab Results  Component Value Date   HGBA1C 6.9 (H) 01/28/2020       Assessment & Plan:  1. Candidiasis of anus - fluconazole (DIFLUCAN) 150 MG tablet; Take 1 tablet (150 mg total) by mouth daily for 3 doses.  Dispense: 3 tablet; Refill: 0  2. Grade II hemorrhoids - hydrocortisone (ANUSOL-HC) 25 MG suppository; Place 1 suppository (25 mg total) rectally 2 (two) times daily.   Dispense: 12 suppository; Refill: 1 Recommend fiber and fluids.   Meds ordered this encounter  Medications  . hydrocortisone (ANUSOL-HC) 25 MG suppository    Sig: Place 1 suppository (25 mg total) rectally 2 (two) times daily.    Dispense:  12 suppository    Refill:  1  . fluconazole (DIFLUCAN) 150 MG tablet    Sig: Take 1 tablet (150 mg total) by mouth daily for 3 doses.    Dispense:  3 tablet    Refill:  0    No orders of the defined types were placed in this encounter.    Follow-up: No follow-ups on file.  An After Visit Summary was printed and given to the patient.  Rochel Brome [facility] [phone]

## 2020-02-29 ENCOUNTER — Ambulatory Visit (INDEPENDENT_AMBULATORY_CARE_PROVIDER_SITE_OTHER): Payer: Medicare Other

## 2020-02-29 DIAGNOSIS — Z23 Encounter for immunization: Secondary | ICD-10-CM

## 2020-02-29 NOTE — Progress Notes (Signed)
   Covid-19 Vaccination Clinic  Name:  Megan Galvan    MRN: [id number] DOB: [birth year]  02/29/2020  [name] was observed post Covid-19 immunization for 15 minutes without incident. She was provided with Vaccine Information Sheet and instruction to access the V-Safe system.   [name] was instructed to call 911 with any severe reactions post vaccine: Marland Kitchen Difficulty breathing  . Swelling of face and throat  . A fast heartbeat  . A bad rash all over body  . Dizziness and weakness

## 2020-03-03 ENCOUNTER — Telehealth: Payer: Self-pay

## 2020-03-03 ENCOUNTER — Other Ambulatory Visit: Payer: Self-pay

## 2020-03-03 MED ORDER — FLUCONAZOLE 100 MG PO TABS: 100.0000 mg | ORAL_TABLET | Freq: Every day | ORAL | 0 refills | Status: DC

## 2020-03-03 NOTE — Telephone Encounter (Signed)
Patient called stating she was burning, swollen and having some redness again in her vaginal area and requested a refill on Diflucan. Per Dr. [name] ok to send in a refill but for Diflucan 100 mg 1 tablet daily for 7 days. Rx sent. Pt informed.

## 2020-03-14 ENCOUNTER — Other Ambulatory Visit: Payer: Self-pay

## 2020-03-14 ENCOUNTER — Encounter: Payer: Self-pay | Admitting: Family Medicine

## 2020-03-14 ENCOUNTER — Ambulatory Visit (INDEPENDENT_AMBULATORY_CARE_PROVIDER_SITE_OTHER): Payer: Medicare Other | Admitting: Family Medicine

## 2020-03-14 VITALS — BP 138/60 | HR 74 | Resp 18 | Ht 66.0 in | Wt 212.0 lb

## 2020-03-14 DIAGNOSIS — E1142 Type 2 diabetes mellitus with diabetic polyneuropathy: Secondary | ICD-10-CM

## 2020-03-14 DIAGNOSIS — K5909 Other constipation: Secondary | ICD-10-CM | POA: Diagnosis not present

## 2020-03-14 DIAGNOSIS — R197 Diarrhea, unspecified: Secondary | ICD-10-CM

## 2020-03-14 NOTE — Progress Notes (Signed)
Subjective:  Patient ID: Megan Galvan, female    DOB: 02-05-1951  Age: 69 y.o. MRN: [id number]  Chief Complaint  Patient presents with  . Diabetes  . Medication Management    Metformin causing nausea, diarrhea and abdominal cramping    HPI  Patient has been dealing with nausea, diarrhea, abdominal cramping and issues with hemorrhoids. Has either constipation or diarrhea. Pt takes miralax and stool softeners intermittently. Nausea, but no vomiting. No abdominal pain, but lots of gas.  Dr. Lyndel Safe saw in  her 11/2019. He performed EGD in 11/2019 and found mild gastritis and Colonoscopy s/p polypectomy in 01/2018 Current Outpatient Medications on File Prior to Visit  Medication Sig Dispense Refill  . ACCU-CHEK AVIVA PLUS test strip CHECK BLOOD SUGAR ONCE  DAILY 100 strip 3  . acetaminophen (TYLENOL) 500 MG tablet 500 mg as needed.     . ANUSOL-HC 2.5 % rectal cream Place 1 application rectally as needed.     Marland Kitchen aspirin EC 81 MG tablet Take 81 mg by mouth daily.    . cetirizine (ZYRTEC) 10 MG tablet Take 1 tablet by mouth daily.    . chlorthalidone (HYGROTON) 50 MG tablet Take 1 tablet (50 mg total) by mouth daily. (Patient taking differently: Take 50 mg by mouth in the morning and at bedtime. ) 90 tablet 1  . conjugated estrogens (PREMARIN) vaginal cream Place 1 Applicatorful vaginally 2 (two) times a week. 42.5 g 2  . famotidine (PEPCID) 40 MG tablet TAKE 1 TABLET BY MOUTH AT  BEDTIME 90 tablet 3  . fluticasone (FLONASE) 50 MCG/ACT nasal spray Place 1 spray into the nose as needed.    . hydrocortisone (ANUSOL-HC) 25 MG suppository Place 1 suppository (25 mg total) rectally 2 (two) times daily. 12 suppository 1  . metFORMIN (GLUCOPHAGE) 1000 MG tablet TAKE 1 TABLET BY MOUTH ONCE DAILY WITH MORNING MEAL (Patient taking differently: Take 1,000 mg by mouth at bedtime. ) 90 tablet 3  . metoprolol succinate (TOPROL-XL) 50 MG 24 hr tablet TAKE ONE-HALF TABLET BY  MOUTH DAILY 45 tablet 3  .  omeprazole (PRILOSEC) 40 MG capsule Take 1 capsule (40 mg total) by mouth daily. 90 capsule 1  . potassium chloride SA (KLOR-CON) 20 MEQ tablet TAKE 2 TABLETS BY MOUTH IN  THE MORNING AND 1 TABLET IN THE EVENING 270 tablet 3  . pravastatin (PRAVACHOL) 40 MG tablet TAKE 1 TABLET BY MOUTH AT  BEDTIME 90 tablet 3  . traZODone (DESYREL) 100 MG tablet TAKE 1 TABLET BY MOUTH  BEFORE BEDTIME 90 tablet 3  . valsartan (DIOVAN) 320 MG tablet TAKE 1 TABLET BY MOUTH ONCE DAILY 90 tablet 3   No current facility-administered medications on file prior to visit.   Past Medical History:  Diagnosis Date  . Allergy   . Anemia   . Arthritis   . Bone spur of acromioclavicular joint, left   . Cataract   . Diabetes mellitus without complication ([place])   . GERD (gastroesophageal reflux disease)   . Heart murmur   . Hyperlipidemia   . Hypertension   . Nonrheumatic aortic (valve) stenosis   . Plantar fasciitis   . Sleep apnea    cpap   Past Surgical History:  Procedure Laterality Date  . BACK SURGERY  11/17/2016   L3-L5  . BILATERAL CARPAL TUNNEL RELEASE    . bone spur Left    left shoulder  . bone spur Right    Right shoulder  . COLONOSCOPY  08/22/2011   Moderate predominantly sigmoid diverticulosis. Small internal hemorrhoids.   Marland Kitchen HEMORROIDECTOMY  02/2019  . NECK SURGERY    . NECK SURGERY    . torn rotator cuff Right    Right shoulder  . torn rotator cuff Left    left shoulder  . TRIGGER FINGER RELEASE Left    thumb  . TRIGGER FINGER RELEASE Right    thumb and middle finger  . UPPER GASTROINTESTINAL ENDOSCOPY    . WRIST SURGERY Left    torn ligament  . WRIST SURGERY Right    cyst    Family History  Problem Relation Age of Onset  . Colon cancer Paternal Grandmother   . Esophageal cancer Neg Hx   . Rectal cancer Neg Hx   . Stomach cancer Neg Hx    Social History   Socioeconomic History  . Marital status: Married    Spouse name: Not on file  . Number of children: 2  . Years of  education: Not on file  . Highest education level: Not on file  Occupational History  . Not on file  Tobacco Use  . Smoking status: Never Smoker  . Smokeless tobacco: Never Used  Vaping Use  . Vaping Use: Never used  Substance and Sexual Activity  . Alcohol use: Never  . Drug use: Never  . Sexual activity: Not on file  Other Topics Concern  . Not on file  Social History Narrative  . Not on file   Social Determinants of Health   Financial Resource Strain:   . Difficulty of Paying Living Expenses: Not on file  Food Insecurity: No Food Insecurity  . Worried About Charity fundraiser in the Last Year: Never true  . Ran Out of Food in the Last Year: Never true  Transportation Needs: No Transportation Needs  . Lack of Transportation (Medical): No  . Lack of Transportation (Non-Medical): No  Physical Activity:   . Days of Exercise per Week: Not on file  . Minutes of Exercise per Session: Not on file  Stress:   . Feeling of Stress : Not on file  Social Connections:   . Frequency of Communication with Friends and Family: Not on file  . Frequency of Social Gatherings with Friends and Family: Not on file  . Attends Religious Services: Not on file  . Active Member of Clubs or Organizations: Not on file  . Attends Archivist Meetings: Not on file  . Marital Status: Not on file    Review of Systems  Constitutional: Negative for chills, fatigue and fever.  HENT: Negative for congestion, rhinorrhea and sore throat.   Respiratory: Negative for cough and shortness of breath.   Cardiovascular: Negative for chest pain and palpitations.  Gastrointestinal: Positive for abdominal pain, diarrhea and nausea. Negative for constipation and vomiting.  Genitourinary: Negative for dysuria and urgency.  Musculoskeletal: Negative for back pain and myalgias.  Neurological: Negative for dizziness, weakness, light-headedness and headaches.  Psychiatric/Behavioral: Negative for dysphoric  mood. The patient is not nervous/anxious.      Objective:  BP 138/60   Pulse 74   Resp 18   Ht 5\' 6"  (1.676 m)   Wt 212 lb (96.2 kg)   BMI 34.22 kg/m   BP/Weight 03/14/2020 02/23/2020 94/04/7406  Systolic BP 144 818 563  Diastolic BP 60 66 60  Wt. (Lbs) 212 210 213.4  BMI 34.22 33.89 34.44    Physical Exam Vitals reviewed.  Constitutional:  Appearance: Normal appearance. She is normal weight.  Cardiovascular:     Rate and Rhythm: Normal rate and regular rhythm.     Pulses: Normal pulses.     Heart sounds: Normal heart sounds.  Pulmonary:     Effort: Pulmonary effort is normal. No respiratory distress.     Breath sounds: Normal breath sounds.  Abdominal:     General: Abdomen is flat. Bowel sounds are normal.     Palpations: Abdomen is soft.     Tenderness: There is no abdominal tenderness.  Neurological:     Mental Status: She is alert and oriented to person, place, and time.  Psychiatric:        Mood and Affect: Mood normal.        Behavior: Behavior normal.     Diabetic Foot Exam - Simple   No data filed       Lab Results  Component Value Date   WBC 6.3 01/28/2020   HGB 11.9 01/28/2020   HCT 35.9 01/28/2020   PLT 211 01/28/2020   GLUCOSE 128 (H) 01/28/2020   CHOL 113 01/28/2020   TRIG 107 01/28/2020   HDL 40 01/28/2020   LDLCALC 53 01/28/2020   ALT 30 01/28/2020   AST 38 01/28/2020   NA 139 01/28/2020   K 4.1 01/28/2020   CL 98 01/28/2020   CREATININE 0.82 01/28/2020   BUN 14 01/28/2020   CO2 28 01/28/2020   HGBA1C 6.9 (H) 01/28/2020   MICROALBUR 30 10/01/2019      Assessment & Plan:   1. Diarrhea, unspecified type 2. Other constipation 3. Diabetes with neuropathy. Plan: Trial of metformin.  Given samples jardiance 10 mg once daily in am If sugars > 150. Call back and update [place] in a couple weeks.    I spent 20 minutes dedicated to the care of this patient on the date of this encounter to include face-to-face time with the  patient, as well as: Preparing to see the patient (review of endoscopies/labs). Performing a medically appropriate examination and/or evaluation. Counseling and educating the patient/family/caregiver. Ordering medications Documenting clinical information in the electronic or other health record.  My nursing staff have aided in the documentation of this note on the behalf of Rochel Brome, MD,as directed by  Rochel Brome, MD and thoroughly reviewed by Rochel Brome, MD.  Follow-up: No follow-ups on file.  An After Visit Summary was printed and given to the patient.  Rochel Brome, MD [facility] [phone]

## 2020-03-14 NOTE — Patient Instructions (Addendum)
Discontinue metformin.  Start jardiance 10 mg once daily in am IF sugars > 150. Update [place] in a couple of weeks.

## 2020-03-20 DIAGNOSIS — E119 Type 2 diabetes mellitus without complications: Secondary | ICD-10-CM | POA: Diagnosis not present

## 2020-03-20 DIAGNOSIS — H5203 Hypermetropia, bilateral: Secondary | ICD-10-CM | POA: Diagnosis not present

## 2020-03-20 LAB — HM DIABETES EYE EXAM

## 2020-03-27 ENCOUNTER — Encounter: Payer: Self-pay | Admitting: Family Medicine

## 2020-03-27 ENCOUNTER — Other Ambulatory Visit: Payer: Self-pay

## 2020-03-27 ENCOUNTER — Ambulatory Visit (INDEPENDENT_AMBULATORY_CARE_PROVIDER_SITE_OTHER): Payer: Medicare Other | Admitting: Family Medicine

## 2020-03-27 ENCOUNTER — Telehealth: Payer: Self-pay

## 2020-03-27 VITALS — BP 122/70 | HR 57 | Resp 18 | Ht 66.0 in | Wt 211.0 lb

## 2020-03-27 DIAGNOSIS — Z0000 Encounter for general adult medical examination without abnormal findings: Secondary | ICD-10-CM | POA: Diagnosis not present

## 2020-03-27 NOTE — Patient Instructions (Addendum)
List the Names of Other Physician/Practitioners you currently use: 1.  Dr Gupta-GI-diarrhea/constipation 2. Manuela Schwartz [facility] 3. Dr Hillard Danker specialist/cataracts  Advanced Directive Ms. Ladell Pier , Thank you for taking time to come for your Medicare Wellness Visit. I appreciate your ongoing commitment to your health goals. Please review the following plan we discussed and let me know if I can assist you in the future.   These are the goals we discussed: Goals    . DIET - EAT MORE FRUITS AND VEGETABLES     Eat a better diet for diabetes. Wants a better layout of foods to help maintain glucose.     Marland Kitchen Pharmacy Care Plan     CARE PLAN ENTRY  Current Barriers:  . Chronic Disease Management support, education, and care coordination needs related to Hypertension, Hyperlipidemia, and Diabetes\   Hypertension . Pharmacist Clinical Goal(s): o Over the next 90 days, patient will work with PharmD and providers to maintain BP goal <130/80 . Current regimen:  o chlorthalidone 25 mg daily  o metoprolol succinate 25 mg daily o valsartan 320 mg daily . Interventions: o Keep up the good work with healthy diet.  . Patient self care activities - Over the next 90 days, patient will: o Check BP monthly, document, and provide at future appointments o Ensure daily salt intake < 2300 mg/day  Hyperlipidemia . Pharmacist Clinical Goal(s): o Over the next 90 days, patient will work with PharmD and providers to maintain LDL goal < 70 . Current regimen:  . pravastatin 40 mg bedtime . aspirin ec 81 mg daily . Interventions: o Keep up the good work with healthy diet.  . Patient self care activities - Over the next 90 days, patient will: o Recommend continuing to incorporate more walking at home each week.   Diabetes . Pharmacist Clinical Goal(s): o Over the next 90 days, patient will work with PharmD and providers to maintain A1c goal <7% . Current regimen:  o Metformin 1000 mg at  bedtime . Interventions: o Keep up the good work eating a healthy diet full of vegetables.  . Patient self care activities - Over the next 90 days, patient will: o Check blood sugar once daily, document, and provide at future appointments o Contact provider with any episodes of hypoglycemia  Medication management . Pharmacist Clinical Goal(s): o Over the next 90 days, patient will work with PharmD and providers to maintain optimal medication adherence . Current pharmacy: [facility] . Interventions o Comprehensive medication review performed. o Continue current medication management strategy . Patient self care activities - Over the next 90 days, patient will: o Focus on medication adherence by continuing to use weekly pill box.  o Take medications as prescribed o Report any questions or concerns to PharmD and/or provider(s)  Initial goal documentation        This is a list of the screening recommended for you and due dates:  Health Maintenance  Topic Date Due  .  Hepatitis C: One time screening is recommended by Center for Disease Control  (CDC) for  adults born from [birth year] through 1964.   Never done  . Pneumonia vaccines (2 of 2 - PPSV23) 04/13/2017  . Hemoglobin A1C  07/28/2020  . Complete foot exam   01/27/2021  . Eye exam for diabetics  03/20/2021  . Mammogram  06/01/2021  . Colon Cancer Screening  02/25/2023  . Tetanus Vaccine  11/22/2024  . Flu Shot  Completed  . DEXA scan (bone density measurement)  Completed  . COVID-19 Vaccine  Completed

## 2020-03-27 NOTE — Progress Notes (Signed)
Subjective:    Megan Galvan is a 69 y.o. female who presents for Medicare Annual/Subsequent preventive examination.  Preventive Screening-Counseling & Management  Tobacco Social History   Tobacco Use  Smoking Status Never Smoker  Smokeless Tobacco Never Used     Problems Prior to Visit 1. Diarrhea/constipation 2. cataracts  Current Problems (verified) Patient Active Problem List   Diagnosis Date Noted  . Essential hypertension 10/01/2019  . Mixed hyperlipidemia   . Vertigo 07/20/2019  . Myalgia, other site 06/07/2019  . Class 1 obesity due to excess calories with serious comorbidity and body mass index (BMI) of 34.0 to 34.9 in adult 06/07/2019  . BMI 35.0-35.9,adult 06/07/2019  . Gastroesophageal reflux disease without esophagitis 06/03/2019  . Type 2 diabetes mellitus with diabetic polyneuropathy ([place]) 06/03/2019  . Primary insomnia 06/03/2019    Medications Prior to Visit Current Outpatient Medications on File Prior to Visit  Medication Sig Dispense Refill  . ACCU-CHEK AVIVA PLUS test strip CHECK BLOOD SUGAR ONCE  DAILY 100 strip 3  . acetaminophen (TYLENOL) 500 MG tablet 500 mg as needed.     . ANUSOL-HC 2.5 % rectal cream Place 1 application rectally as needed.     Marland Kitchen aspirin EC 81 MG tablet Take 81 mg by mouth daily.    . cetirizine (ZYRTEC) 10 MG tablet Take 1 tablet by mouth daily.    . chlorthalidone (HYGROTON) 50 MG tablet Take 1 tablet (50 mg total) by mouth daily. (Patient taking differently: Take 50 mg by mouth in the morning and at bedtime. ) 90 tablet 1  . conjugated estrogens (PREMARIN) vaginal cream Place 1 Applicatorful vaginally 2 (two) times a week. 42.5 g 2  . famotidine (PEPCID) 40 MG tablet TAKE 1 TABLET BY MOUTH AT  BEDTIME 90 tablet 3  . fluticasone (FLONASE) 50 MCG/ACT nasal spray Place 1 spray into the nose as needed.    . hydrocortisone (ANUSOL-HC) 25 MG suppository Place 1 suppository (25 mg total) rectally 2 (two) times daily. 12 suppository  1  . metoprolol succinate (TOPROL-XL) 50 MG 24 hr tablet TAKE ONE-HALF TABLET BY  MOUTH DAILY 45 tablet 3  . omeprazole (PRILOSEC) 40 MG capsule Take 1 capsule (40 mg total) by mouth daily. 90 capsule 1  . potassium chloride SA (KLOR-CON) 20 MEQ tablet TAKE 2 TABLETS BY MOUTH IN  THE MORNING AND 1 TABLET IN THE EVENING 270 tablet 3  . pravastatin (PRAVACHOL) 40 MG tablet TAKE 1 TABLET BY MOUTH AT  BEDTIME 90 tablet 3  . traZODone (DESYREL) 100 MG tablet TAKE 1 TABLET BY MOUTH  BEFORE BEDTIME 90 tablet 3  . valsartan (DIOVAN) 320 MG tablet TAKE 1 TABLET BY MOUTH ONCE DAILY 90 tablet 3   No current facility-administered medications on file prior to visit.    Current Medications (verified) Current Outpatient Medications  Medication Sig Dispense Refill  . ACCU-CHEK AVIVA PLUS test strip CHECK BLOOD SUGAR ONCE  DAILY 100 strip 3  . acetaminophen (TYLENOL) 500 MG tablet 500 mg as needed.     . ANUSOL-HC 2.5 % rectal cream Place 1 application rectally as needed.     Marland Kitchen aspirin EC 81 MG tablet Take 81 mg by mouth daily.    . cetirizine (ZYRTEC) 10 MG tablet Take 1 tablet by mouth daily.    . chlorthalidone (HYGROTON) 50 MG tablet Take 1 tablet (50 mg total) by mouth daily. (Patient taking differently: Take 50 mg by mouth in the morning and at bedtime. ) 90 tablet  1  . conjugated estrogens (PREMARIN) vaginal cream Place 1 Applicatorful vaginally 2 (two) times a week. 42.5 g 2  . empagliflozin (JARDIANCE) 10 MG TABS tablet Take 10 mg by mouth daily.    . famotidine (PEPCID) 40 MG tablet TAKE 1 TABLET BY MOUTH AT  BEDTIME 90 tablet 3  . fluticasone (FLONASE) 50 MCG/ACT nasal spray Place 1 spray into the nose as needed.    . hydrocortisone (ANUSOL-HC) 25 MG suppository Place 1 suppository (25 mg total) rectally 2 (two) times daily. 12 suppository 1  . metoprolol succinate (TOPROL-XL) 50 MG 24 hr tablet TAKE ONE-HALF TABLET BY  MOUTH DAILY 45 tablet 3  . omeprazole (PRILOSEC) 40 MG capsule Take 1 capsule  (40 mg total) by mouth daily. 90 capsule 1  . potassium chloride SA (KLOR-CON) 20 MEQ tablet TAKE 2 TABLETS BY MOUTH IN  THE MORNING AND 1 TABLET IN THE EVENING 270 tablet 3  . pravastatin (PRAVACHOL) 40 MG tablet TAKE 1 TABLET BY MOUTH AT  BEDTIME 90 tablet 3  . traZODone (DESYREL) 100 MG tablet TAKE 1 TABLET BY MOUTH  BEFORE BEDTIME 90 tablet 3  . valsartan (DIOVAN) 320 MG tablet TAKE 1 TABLET BY MOUTH ONCE DAILY 90 tablet 3   No current facility-administered medications for this visit.     Allergies (verified) Ace inhibitors, Doxycycline, Keflex [cephalexin], Latex, Nexium [esomeprazole magnesium], and Protonix [pantoprazole sodium]   PAST HISTORY  Family History Family History  Problem Relation Age of Onset  . Colon cancer Paternal Grandmother   . Esophageal cancer Neg Hx   . Rectal cancer Neg Hx   . Stomach cancer Neg Hx     Social History Social History   Tobacco Use  . Smoking status: Never Smoker  . Smokeless tobacco: Never Used  Substance Use Topics  . Alcohol use: Never     Are there smokers in your home (other than you)? None smokers  Risk Factors Current exercise habits:no regular exercise Dietary issues discussed: no concerns with swallowing or eating. Pt states she chews meat well.  Make own food  Cardiac risk factors: DM/HTN/Hyperlipidemia  Depression Screen-PHQ9-2   Activities of Daily Living In your present state of health, do you have any difficulty performing the following activities?:  Driving? Pt does not drive since back surgery Managing money?  Pt with no difficulty managing money Feeding yourself? No concerns Getting from bed to chair?no concerns Climbing a flight of stairs?no stairs Preparing food and eating?:pt cooks Bathing or showering?no concerns Getting dressed: no difficulty Getting to the toilet? No difficulty Using the toilet-constipation-metformin caused problems-abdominal pain Moving around from place to place:no difficulty   In the past year have you fallen or had a near fall?: no falls   Do you have more than one partner?  50th wedding anniversary  Hearing Difficulties: no concerns   Do you feel that you have a problem with memory? yes  Do you often misplace items? yes  Do you feel safe at home? yes  Cognitive Testing normal per pt   Advanced Directives have been discussed with the patient? no List the Names of Other Physician/Practitioners you currently use: 1.  Dr Gupta-GI-diarrhea/constipation 2. Manuela Schwartz [facility] 3. Dr Hillard Danker specialist/cataracts  Immunization History  Administered Date(s) Administered  . Hepatitis B 06/22/2013, 11/16/2014, 05/26/2015  . Influenza, High Dose Seasonal PF 12/29/2018  . Influenza-Unspecified 01/24/2020  . Moderna SARS-COV2 Booster Vaccination 02/29/2020  . Moderna SARS-COVID-2 Vaccination 06/07/2019, 07/05/2019  . Pneumococcal Conjugate-13 12/29/2013  . Pneumococcal  Polysaccharide-23 04/13/2012  . Td 11/23/2014    Screening Tests Health Maintenance  Topic Date Due  . Hepatitis C Screening  Never done  . PNA vac Low Risk Adult (2 of 2 - PPSV23) 04/13/2017  . HEMOGLOBIN A1C  07/28/2020  . FOOT EXAM  01/27/2021  . OPHTHALMOLOGY EXAM  03/20/2021  . MAMMOGRAM  06/01/2021  . COLONOSCOPY  02/25/2023  . TETANUS/TDAP  11/22/2024  . INFLUENZA VACCINE  Completed  . DEXA SCAN  Completed  . COVID-19 Vaccine  Completed    All answers were reviewed with the patient and necessary referrals were made:  Mertha Baars, MD   03/27/2020   Objective:     Vision by Snellen chart: saw Dr. Valarie Merino cataract surgery :Body mass index is 34.06 kg/m. BP 122/70   Pulse (!) 57   Resp 18   Ht 5\' 6"  (1.676 m)   Wt 211 lb (95.7 kg)   SpO2 98%   BMI 34.06 kg/m     Assessment:  1. Medicare annual wellness visit, subsequent DEXA 2023-Calcium/ Mammogram-2/22    Plan:   Advanced directive  Medicare Attestation I have personally  reviewed: The patient's medical and social history Their use of alcohol, tobacco or illicit drugs Their current medications and supplements The patient's functional ability including ADLs,fall risks, home safety risks, cognitive, and hearing and visual impairment Diet and physical activities Evidence for depression or mood disorders  The patient's weight, height, BMI, and visual acuity have been recorded in the chart.  I have made referrals, counseling, and provided education to the patient based on review of the above and I have provided the patient with a written personalized care plan for preventive services.     Mertha Baars, MD   03/27/2020

## 2020-03-27 NOTE — Telephone Encounter (Signed)
Continue jardiance at current dose.  High sugars may have been due to the holiday. (or she may eventually need a higher dose) I do not want her back on metformin. Ask if her bowels have improved?

## 2020-03-27 NOTE — Telephone Encounter (Signed)
Patient called stating that since starting Jardiance her FBS have been 150/170/173. Per Dr. [name] patient is to continue current dose and let us know if her FBS are remaining high, since patient started medication the week of Thanksgiving. Patient has 1 more week of samples and will start that bottle on Wednesday. Advised patient to call us at the beginning of next week with her readings.

## 2020-03-30 ENCOUNTER — Ambulatory Visit: Payer: Medicare Other

## 2020-03-30 ENCOUNTER — Other Ambulatory Visit: Payer: Self-pay

## 2020-03-30 DIAGNOSIS — E782 Mixed hyperlipidemia: Secondary | ICD-10-CM

## 2020-03-30 DIAGNOSIS — E1142 Type 2 diabetes mellitus with diabetic polyneuropathy: Secondary | ICD-10-CM

## 2020-03-30 DIAGNOSIS — I10 Essential (primary) hypertension: Secondary | ICD-10-CM

## 2020-03-30 NOTE — Chronic Care Management (AMB) (Signed)
Chronic Care Management Pharmacy  Name: Megan Galvan  MRN: [id number] DOB: 08-Feb-1951  Chief Complaint/ HPI  Megan Galvan,  69 y.o. , female presents for their Follow-Up CCM visit with the clinical pharmacist via telephone due to COVID-19 Pandemic.  PCP : Rochel Brome, MD   Plan Recommendations:   Patient is concerned with her fluctuating blood sugars. She is continuing Jardiance 10 mg daily and working on improving her diet. Patient will contact pharmacist if continued of Jardiance. Pharmacist will coordinate patient assistance at that time.   Their chronic conditions include: GERD, DM, Insomnia, Vertigo, HTN, HLD, Anemia.  Office Visits: 03/27/2020 - Medicare AWV.  03/14/2020 - stop metformin. Start jardiance 10 mg once daily in am if sugars >150.  02/29/2020 - Moderna Covid booster.  02/23/2020 - fluconazole for candidiasis of anus. Anusol hc suppository for hemorrhoids. Recommend fiber and fluids. 01/28/2020 - increase chlorthalidone to 50 mg once daily.  10/01/2019 - no changes to medication.   Consult Visit: 12/02/2019 - Gertie Fey - stool studies for GI pathogens. Stop magnesium. Trial of Bentyl 10 mg bid. Consider stopping metformin.   Medications: Outpatient Encounter Medications as of 03/30/2020  Medication Sig Note  . ACCU-CHEK AVIVA PLUS test strip CHECK BLOOD SUGAR ONCE  DAILY   . acetaminophen (TYLENOL) 500 MG tablet 500 mg as needed.    . ANUSOL-HC 2.5 % rectal cream Place 1 application rectally as needed.    Marland Kitchen aspirin EC 81 MG tablet Take 81 mg by mouth daily.   . cetirizine (ZYRTEC) 10 MG tablet Take 1 tablet by mouth daily.   . chlorthalidone (HYGROTON) 50 MG tablet Take 1 tablet (50 mg total) by mouth daily. (Patient taking differently: Take 50 mg by mouth in the morning and at bedtime. )   . conjugated estrogens (PREMARIN) vaginal cream Place 1 Applicatorful vaginally 2 (two) times a week.   . empagliflozin (JARDIANCE) 10 MG TABS tablet Take 10 mg by mouth  daily.   . famotidine (PEPCID) 40 MG tablet TAKE 1 TABLET BY MOUTH AT  BEDTIME   . fluticasone (FLONASE) 50 MCG/ACT nasal spray Place 1 spray into the nose as needed. 12/11/2017: 1 spray in each   . hydrocortisone (ANUSOL-HC) 25 MG suppository Place 1 suppository (25 mg total) rectally 2 (two) times daily.   . metoprolol succinate (TOPROL-XL) 50 MG 24 hr tablet TAKE ONE-HALF TABLET BY  MOUTH DAILY   . omeprazole (PRILOSEC) 40 MG capsule Take 1 capsule (40 mg total) by mouth daily.   . potassium chloride SA (KLOR-CON) 20 MEQ tablet TAKE 2 TABLETS BY MOUTH IN  THE MORNING AND 1 TABLET IN THE EVENING   . pravastatin (PRAVACHOL) 40 MG tablet TAKE 1 TABLET BY MOUTH AT  BEDTIME   . traZODone (DESYREL) 100 MG tablet TAKE 1 TABLET BY MOUTH  BEFORE BEDTIME   . valsartan (DIOVAN) 320 MG tablet TAKE 1 TABLET BY MOUTH ONCE DAILY    No facility-administered encounter medications on file as of 03/30/2020.   Allergies  Allergen Reactions  . Ace Inhibitors Cough  . Doxycycline     Rash   . Keflex [Cephalexin]   . Latex     Burn and itch  . Nexium [Esomeprazole Magnesium] Diarrhea  . Protonix [Pantoprazole Sodium] Other (See Comments)    Constipation   SDOH Screenings   Alcohol Screen:   . Last Alcohol Screening Score (AUDIT): Not on file  Depression (PHQ2-9): Low Risk   . PHQ-2 Score: 2  Financial  Resource Strain:   . Difficulty of Paying Living Expenses: Not on file  Food Insecurity: No Food Insecurity  . Worried About Charity fundraiser in the Last Year: Never true  . Ran Out of Food in the Last Year: Never true  Housing: Low Risk   . Last Housing Risk Score: 0  Physical Activity:   . Days of Exercise per Week: Not on file  . Minutes of Exercise per Session: Not on file  Social Connections:   . Frequency of Communication with Friends and Family: Not on file  . Frequency of Social Gatherings with Friends and Family: Not on file  . Attends Religious Services: Not on file  . Active Member  of Clubs or Organizations: Not on file  . Attends Archivist Meetings: Not on file  . Marital Status: Not on file  Stress:   . Feeling of Stress : Not on file  Tobacco Use: Low Risk   . Smoking Tobacco Use: Never Smoker  . Smokeless Tobacco Use: Never Used  Transportation Needs: No Transportation Needs  . Lack of Transportation (Medical): No  . Lack of Transportation (Non-Medical): No    Current Diagnosis/Assessment:  Goals Addressed            This Visit's Progress   . Pharmacy Care Plan       CARE PLAN ENTRY  Current Barriers:  . Chronic Disease Management support, education, and care coordination needs related to Hypertension, Hyperlipidemia, and Diabetes\   Hypertension . Pharmacist Clinical Goal(s): o Over the next 90 days, patient will work with PharmD and providers to maintain BP goal <130/80 . Current regimen:  o chlorthalidone 50 mg daily  o metoprolol succinate 25 mg daily o valsartan 320 mg daily . Interventions: o Keep up the good work with healthy diet.  . Patient self care activities - Over the next 90 days, patient will: o Check BP monthly, document, and provide at future appointments o Ensure daily salt intake < 2300 mg/day  Hyperlipidemia . Pharmacist Clinical Goal(s): o Over the next 90 days, patient will work with PharmD and providers to maintain LDL goal < 70 . Current regimen:  . pravastatin 40 mg bedtime . aspirin ec 81 mg daily . Interventions: o Keep up the good work with healthy diet.  . Patient self care activities - Over the next 90 days, patient will: o Recommend continuing to incorporate more walking at home each week.   Diabetes . Pharmacist Clinical Goal(s): o Over the next 90 days, patient will work with PharmD and providers to maintain A1c goal <7% . Current regimen:  o Jardiance 10 mg daily  . Interventions: o Keep up the good work eating a healthy diet full of vegetables.  o Encouraged patient to begin exercising  at home using arm weights while sitting. Discussed ultimate goal of 150 minutes each week and building up slowly to that point.  o Patient will contact pharmacist if needs patient assistance for Jardiance.  . Patient self care activities - Over the next 90 days, patient will: o Check blood sugar once daily, document, and provide at future appointments o Contact provider with any episodes of hypoglycemia  Medication management . Pharmacist Clinical Goal(s): o Over the next 90 days, patient will work with PharmD and providers to maintain optimal medication adherence . Current pharmacy: [facility] . Interventions o Comprehensive medication review performed. o Continue current medication management strategy . Patient self care activities - Over the next  90 days, patient will: o Focus on medication adherence by continuing to use weekly pill box.  o Take medications as prescribed o Report any questions or concerns to PharmD and/or provider(s)  Please see past updates related to this goal by clicking on the "Past Updates" button in the selected goal        Diabetes   Recent Relevant Labs: Lab Results  Component Value Date/Time   HGBA1C 6.9 (H) 01/28/2020 11:23 AM   HGBA1C 6.6 (H) 10/01/2019 10:34 AM   MICROALBUR 30 10/01/2019 10:27 AM   MICROALBUR 10 06/03/2019 11:13 AM     Checking BG: Weekly  Recent FBG Readings: 139, 204 mg/dL Patient has failed these meds in past: metformin Patient is currently controlled on the following medications:   Jardiance 10 mg daily   aviva plus testing supplies  Last diabetic Foot exam:  05/2019 Last diabetic Eye exam: 02/2020  We discussed: diet and exercise extensively. Has cut back on bread and sweets lately. She plans to begin exercising with arm weights at home. Patient's balance limits ability to walk well.   Patient recently began Jardiance 10 mg daily she denies side effects but concerned about her sugars fluctuating. Today's  fasting was 139. Patient had a reading of 204 one day this week after eating meatloaf with Sherl Yzaguirre sugar/ketchup topping. She is going to watch her diet a little more closely to help avoid that.   She is using samples at this time but if continued will pursue patient assistance. Discussed patient assistance options. Pharmacist will coordinate if needed. Patient has pharmacist phone number if needed.   Plan  Continue current medications  Hypertension   BP today is:  <130/80  Office blood pressures are  BP Readings from Last 3 Encounters:  03/27/20 122/70  03/14/20 138/60  02/23/20 122/66    Patient has failed these meds in the past: azilsartan medoxomil  Patient is currently controlled on the following medications:   chlorthalidone 60 mg daily,   metoprolol succinate 50 mg 1/2 tablet daily  valsartan 320 mg daily  Patient checks BP at home several times per month  Patient home BP readings are ranging: 128/72 mmHg  We discussed diet and exercise extensively. Patient reports pleased with current regimen and good control.   Plan  Continue current medications   Hyperlipidemia   Lipid Panel     Component Value Date/Time   CHOL 113 01/28/2020 1123   TRIG 107 01/28/2020 1123   HDL 40 01/28/2020 1123   LDLCALC 53 01/28/2020 1123     The ASCVD Risk score (Goff DC Jr., et al., 2013) failed to calculate for the following reasons:   The valid total cholesterol range is 130 to 320 mg/dL   Patient has failed these meds in past: n/a Patient is currently controlled on the following medications:  . pravastatin 40 mg bedtime . aspirin ec 81 mg daily  We discussed:  diet and exercise extensively. Reviewed updated lab results. Patient is working to improve her diet and plans to begin using arm weights for exercise.   Plan  Continue current medications    and  Other Diagnosis:GERD    Patient has failed these meds in past: n/a Patient is currently controlled on the  following medications:   prevacid 30 mg daily  famotidine 40 mg qhs  We discussed:  diet and exercise extensively. Patient reports stomach symptoms of morning nausea have improved since stopping metformin.    Plan  Continue current medications  Vaccines   Reviewed and discussed patient's vaccination history.  Has had both COVID vaccines Moderna. We discussed Shingrix but patient checked on the price last year and was going to be $300 for series. She is going to call her pharmacy this week to see if the copay has changed for this year.   Immunization History  Administered Date(s) Administered  . Hepatitis B 06/22/2013, 11/16/2014, 05/26/2015  . Influenza, High Dose Seasonal PF 12/29/2018  . Influenza-Unspecified 01/24/2020  . Moderna SARS-COV2 Booster Vaccination 02/29/2020  . Moderna SARS-COVID-2 Vaccination 06/07/2019, 07/05/2019  . Pneumococcal Conjugate-13 12/29/2013  . Pneumococcal Polysaccharide-23 04/13/2012  . Td 11/23/2014    Plan  Recommended patient receive annual flu vaccine in office.   Medication Management   [facility] Uses pill box? Yes Pt endorses 100% compliance  We discussed: Patient does not miss doses of her medication. Her pill box is helping keep her on track.   Plan  Continue current medication management strategy    Follow up: 3 month phone visit

## 2020-03-30 NOTE — Patient Instructions (Addendum)
Visit Information  Goals Addressed            This Visit's Progress   . Pharmacy Care Plan       CARE PLAN ENTRY  Current Barriers:  . Chronic Disease Management support, education, and care coordination needs related to Hypertension, Hyperlipidemia, and Diabetes\   Hypertension . Pharmacist Clinical Goal(s): o Over the next 90 days, patient will work with PharmD and providers to maintain BP goal <130/80 . Current regimen:  o chlorthalidone 50 mg daily  o metoprolol succinate 25 mg daily o valsartan 320 mg daily . Interventions: o Keep up the good work with healthy diet.  . Patient self care activities - Over the next 90 days, patient will: o Check BP monthly, document, and provide at future appointments o Ensure daily salt intake < 2300 mg/day  Hyperlipidemia . Pharmacist Clinical Goal(s): o Over the next 90 days, patient will work with PharmD and providers to maintain LDL goal < 70 . Current regimen:  . pravastatin 40 mg bedtime . aspirin ec 81 mg daily . Interventions: o Keep up the good work with healthy diet.  . Patient self care activities - Over the next 90 days, patient will: o Recommend continuing to incorporate more walking at home each week.   Diabetes . Pharmacist Clinical Goal(s): o Over the next 90 days, patient will work with PharmD and providers to maintain A1c goal <7% . Current regimen:  o Jardiance 10 mg daily  . Interventions: o Keep up the good work eating a healthy diet full of vegetables.  o Encouraged patient to begin exercising at home using arm weights while sitting. Discussed ultimate goal of 150 minutes each week and building up slowly to that point.  o Patient will contact pharmacist if needs patient assistance for Jardiance.  . Patient self care activities - Over the next 90 days, patient will: o Check blood sugar once daily, document, and provide at future appointments o Contact provider with any episodes of hypoglycemia  Medication  management . Pharmacist Clinical Goal(s): o Over the next 90 days, patient will work with PharmD and providers to maintain optimal medication adherence . Current pharmacy: [facility] . Interventions o Comprehensive medication review performed. o Continue current medication management strategy . Patient self care activities - Over the next 90 days, patient will: o Focus on medication adherence by continuing to use weekly pill box.  o Take medications as prescribed o Report any questions or concerns to PharmD and/or provider(s)  Please see past updates related to this goal by clicking on the "Past Updates" button in the selected goal         The patient verbalized understanding of instructions, educational materials, and care plan provided today and declined offer to receive copy of patient instructions, educational materials, and care plan.   Telephone follow up appointment with pharmacy team member scheduled for: 06/2020  Sherre Poot, PharmD, [facility] Clinical Pharmacist [facility] [phone] (office) [phone] (mobile)   Exercises To Do While Sitting  Exercises that you do while sitting (chair exercises) can give you many of the same benefits as full exercise. Benefits include strengthening your heart, burning calories, and keeping muscles and joints healthy. Exercise can also improve your mood and help with depression and anxiety. You may benefit from chair exercises if you are unable to do standing exercises because of:  Diabetic foot pain.  Obesity.  Illness.  Arthritis.  Recovery from surgery or injury.  Breathing problems.  Balance problems.  Another type of disability. Before starting chair exercises, check with your health care provider or a physical therapist to find out how much exercise you can tolerate and which exercises are safe for you. If your health care provider approves:  Start out slowly and build up over time. Aim to work  up to about 10-20 minutes for each exercise session.  Make exercise part of your daily routine.  Drink water when you exercise. Do not wait until you are thirsty. Drink every 10-15 minutes.  Stop exercising right away if you have pain, nausea, shortness of breath, or dizziness.  If you are exercising in a wheelchair, make sure to lock the wheels.  Ask your health care provider whether you can do tai chi or yoga. Many positions in these mind-body exercises can be modified to do while seated. Warm-up Before starting other exercises: 1. Sit up as straight as you can. Have your knees bent at 90 degrees, which is the shape of the capital letter "L." Keep your feet flat on the floor. 2. Sit at the front edge of your chair, if you can. 3. Pull in (tighten) the muscles in your abdomen and stretch your spine and neck as straight as you can. Hold this position for a few minutes. 4. Breathe in and out evenly. Try to concentrate on your breathing, and relax your mind. Stretching Exercise A: Arm stretch 1. Hold your arms out straight in front of your body. 2. Bend your hands at the wrist with your fingers pointing up, as if signaling someone to stop. Notice the slight tension in your forearms as you hold the position. 3. Keeping your arms out and your hands bent, rotate your hands outward as far as you can and hold this stretch. Aim to have your thumbs pointing up and your pinkie fingers pointing down. Slowly repeat arm stretches for one minute as tolerated. Exercise B: Leg stretch 1. If you can move your legs, try to "draw" letters on the floor with the toes of your foot. Write your name with one foot. 2. Write your name with the toes of your other foot. Slowly repeat the movements for one minute as tolerated. Exercise C: Reach for the sky 1. Reach your hands as far over your head as you can to stretch your spine. 2. Move your hands and arms as if you are climbing a rope. Slowly repeat the  movements for one minute as tolerated. Range of motion exercises Exercise A: Shoulder roll 1. Let your arms hang loosely at your sides. 2. Lift just your shoulders up toward your ears, then let them relax back down. 3. When your shoulders feel loose, rotate your shoulders in backward and forward circles. Do shoulder rolls slowly for one minute as tolerated. Exercise B: March in place 1. As if you are marching, pump your arms and lift your legs up and down. Lift your knees as high as you can. ? If you are unable to lift your knees, just pump your arms and move your ankles and feet up and down. March in place for one minute as tolerated. Exercise C: Seated jumping jacks 1. Let your arms hang down straight. 2. Keeping your arms straight, lift them up over your head. Aim to point your fingers to the ceiling. 3. While you lift your arms, straighten your legs and slide your heels along the floor to your sides, as wide as you can. 4. As you bring your arms back down to  your sides, slide your legs back together. ? If you are unable to use your legs, just move your arms. Slowly repeat seated jumping jacks for one minute as tolerated. Strengthening exercises Exercise A: Shoulder squeeze 1. Hold your arms straight out from your body to your sides, with your elbows bent and your fists pointed at the ceiling. 2. Keeping your arms in the bent position, move them forward so your elbows and forearms meet in front of your face. 3. Open your arms back out as wide as you can with your elbows still bent, until you feel your shoulder blades squeezing together. Hold for 5 seconds. Slowly repeat the movements forward and backward for one minute as tolerated. Contact a health care provider if you:  Had to stop exercising due to any of the following: ? Pain. ? Nausea. ? Shortness of breath. ? Dizziness. ? Fatigue.  Have significant pain or soreness after exercising. Get help right away if you have:  Chest  pain.  Difficulty breathing. These symptoms may represent a serious problem that is an emergency. Do not wait to see if the symptoms will go away. Get medical help right away. Call your local emergency services (911 in the [place] Do not drive yourself to the hospital. This information is not intended to replace advice given to you by your health care provider. Make sure you discuss any questions you have with your health care provider. Document Revised: 08/06/2018 Document Reviewed: 02/26/2017 Elsevier Patient Education  2020 [employer].

## 2020-04-03 NOTE — Telephone Encounter (Signed)
Patient called with FBS readings ranging from 133-190 since last Monday. Per Dr. [name] patient can either stay at her current dose of 10 mg or she could increase to 25 mg. Patient stated she will increase to 25 mg tablets and will come by our office pick up samples. She did deny having any vaginal itching or urinary issues. She also reported that since d/c metformin her bowels have improved and she has no longer has any abdominal pain.

## 2020-04-05 ENCOUNTER — Encounter: Payer: Self-pay | Admitting: Family Medicine

## 2020-04-05 ENCOUNTER — Ambulatory Visit (INDEPENDENT_AMBULATORY_CARE_PROVIDER_SITE_OTHER): Payer: Medicare Other | Admitting: Family Medicine

## 2020-04-05 ENCOUNTER — Other Ambulatory Visit: Payer: Self-pay

## 2020-04-05 VITALS — BP 136/64 | HR 72 | Temp 97.4°F | Resp 18 | Ht 66.0 in | Wt 209.0 lb

## 2020-04-05 DIAGNOSIS — N952 Postmenopausal atrophic vaginitis: Secondary | ICD-10-CM | POA: Diagnosis not present

## 2020-04-05 DIAGNOSIS — B3789 Other sites of candidiasis: Secondary | ICD-10-CM | POA: Diagnosis not present

## 2020-04-05 DIAGNOSIS — E1142 Type 2 diabetes mellitus with diabetic polyneuropathy: Secondary | ICD-10-CM

## 2020-04-05 DIAGNOSIS — R300 Dysuria: Secondary | ICD-10-CM | POA: Diagnosis not present

## 2020-04-05 LAB — POCT URINALYSIS DIPSTICK
Bilirubin, UA: NEGATIVE
Blood, UA: NEGATIVE
Glucose, UA: POSITIVE — AB
Ketones, UA: NEGATIVE
Leukocytes, UA: NEGATIVE
Nitrite, UA: NEGATIVE
Protein, UA: NEGATIVE
Spec Grav, UA: 1.02 (ref 1.010–1.025)
Urobilinogen, UA: NEGATIVE E.U./dL — AB
pH, UA: 6 (ref 5.0–8.0)

## 2020-04-05 MED ORDER — FLUCONAZOLE 100 MG PO TABS: 100.0000 mg | ORAL_TABLET | Freq: Every day | ORAL | 0 refills | Status: DC

## 2020-04-05 NOTE — Progress Notes (Signed)
Acute Office Visit  Subjective:    Patient ID: Megan Galvan, female    DOB: 1950/06/20, 69 y.o.   MRN: [id number]  Chief Complaint  Patient presents with  . vaginal itching and burning    HPI Patient is in today for vaginal itching.  Diabetes: Patient is taking Jardiance.  Her sugars have been anywhere from 130 and 160.  I did discontinue her Metformin which did help her diarrhea.  Yeast infection: Around anus  Atrophic vaginitis: Patient is using Premarin cream regularly internally is helping.  She still itches and burns on the outside.  She is not using it on the outside however.  Patient also has itching around her anus and burning.  Is here uncomfortable to sit.  She improves with treatment (Anusol + HC and fluconazole) and then worsens again.  Past Medical History:  Diagnosis Date  . Allergy   . Anemia   . Arthritis   . Bone spur of acromioclavicular joint, left   . Cataract   . Diabetes mellitus without complication ([place])   . GERD (gastroesophageal reflux disease)   . Heart murmur   . Hyperlipidemia   . Hypertension   . Nonrheumatic aortic (valve) stenosis   . Plantar fasciitis   . Sleep apnea    cpap    Past Surgical History:  Procedure Laterality Date  . BACK SURGERY  11/17/2016   L3-L5  . BILATERAL CARPAL TUNNEL RELEASE    . bone spur Left    left shoulder  . bone spur Right    Right shoulder  . COLONOSCOPY  08/22/2011   Moderate predominantly sigmoid diverticulosis. Small internal hemorrhoids.   Marland Kitchen HEMORROIDECTOMY  02/2019  . NECK SURGERY    . NECK SURGERY    . torn rotator cuff Right    Right shoulder  . torn rotator cuff Left    left shoulder  . TRIGGER FINGER RELEASE Left    thumb  . TRIGGER FINGER RELEASE Right    thumb and middle finger  . UPPER GASTROINTESTINAL ENDOSCOPY    . WRIST SURGERY Left    torn ligament  . WRIST SURGERY Right    cyst    Family History  Problem Relation Age of Onset  . Colon cancer Paternal Grandmother    . Esophageal cancer Neg Hx   . Rectal cancer Neg Hx   . Stomach cancer Neg Hx     Social History   Socioeconomic History  . Marital status: Married    Spouse name: Not on file  . Number of children: 2  . Years of education: Not on file  . Highest education level: Not on file  Occupational History  . Not on file  Tobacco Use  . Smoking status: Never Smoker  . Smokeless tobacco: Never Used  Vaping Use  . Vaping Use: Never used  Substance and Sexual Activity  . Alcohol use: Never  . Drug use: Never  . Sexual activity: Not on file  Other Topics Concern  . Not on file  Social History Narrative  . Not on file   Social Determinants of Health   Financial Resource Strain:   . Difficulty of Paying Living Expenses: Not on file  Food Insecurity: No Food Insecurity  . Worried About Charity fundraiser in the Last Year: Never true  . Ran Out of Food in the Last Year: Never true  Transportation Needs: No Transportation Needs  . Lack of Transportation (Medical): No  . Lack of  Transportation (Non-Medical): No  Physical Activity:   . Days of Exercise per Week: Not on file  . Minutes of Exercise per Session: Not on file  Stress:   . Feeling of Stress : Not on file  Social Connections:   . Frequency of Communication with Friends and Family: Not on file  . Frequency of Social Gatherings with Friends and Family: Not on file  . Attends Religious Services: Not on file  . Active Member of Clubs or Organizations: Not on file  . Attends Archivist Meetings: Not on file  . Marital Status: Not on file  Intimate Partner Violence:   . Fear of Current or Ex-Partner: Not on file  . Emotionally Abused: Not on file  . Physically Abused: Not on file  . Sexually Abused: Not on file    Outpatient Medications Prior to Visit  Medication Sig Dispense Refill  . ACCU-CHEK AVIVA PLUS test strip CHECK BLOOD SUGAR ONCE  DAILY 100 strip 3  . acetaminophen (TYLENOL) 500 MG tablet 500 mg as  needed.     . ANUSOL-HC 2.5 % rectal cream Place 1 application rectally as needed.     Marland Kitchen aspirin EC 81 MG tablet Take 81 mg by mouth daily.    . cetirizine (ZYRTEC) 10 MG tablet Take 1 tablet by mouth daily.    . chlorthalidone (HYGROTON) 50 MG tablet Take 1 tablet (50 mg total) by mouth daily. (Patient taking differently: Take 50 mg by mouth in the morning and at bedtime. ) 90 tablet 1  . conjugated estrogens (PREMARIN) vaginal cream Place 1 Applicatorful vaginally 2 (two) times a week. 42.5 g 2  . famotidine (PEPCID) 40 MG tablet TAKE 1 TABLET BY MOUTH AT  BEDTIME 90 tablet 3  . fluticasone (FLONASE) 50 MCG/ACT nasal spray Place 1 spray into the nose as needed.    . hydrocortisone (ANUSOL-HC) 25 MG suppository Place 1 suppository (25 mg total) rectally 2 (two) times daily. 12 suppository 1  . metoprolol succinate (TOPROL-XL) 50 MG 24 hr tablet TAKE ONE-HALF TABLET BY  MOUTH DAILY 45 tablet 3  . omeprazole (PRILOSEC) 40 MG capsule Take 1 capsule (40 mg total) by mouth daily. 90 capsule 1  . potassium chloride SA (KLOR-CON) 20 MEQ tablet TAKE 2 TABLETS BY MOUTH IN  THE MORNING AND 1 TABLET IN THE EVENING 270 tablet 3  . pravastatin (PRAVACHOL) 40 MG tablet TAKE 1 TABLET BY MOUTH AT  BEDTIME 90 tablet 3  . traZODone (DESYREL) 100 MG tablet TAKE 1 TABLET BY MOUTH  BEFORE BEDTIME 90 tablet 3  . valsartan (DIOVAN) 320 MG tablet TAKE 1 TABLET BY MOUTH ONCE DAILY 90 tablet 3  . empagliflozin (JARDIANCE) 10 MG TABS tablet Take 10 mg by mouth daily.     No facility-administered medications prior to visit.    Allergies  Allergen Reactions  . Ace Inhibitors Cough  . Doxycycline     Rash   . Keflex [Cephalexin]   . Latex     Burn and itch  . Nexium [Esomeprazole Magnesium] Diarrhea  . Protonix [Pantoprazole Sodium] Other (See Comments)    Constipation    Review of Systems  Constitutional: Negative for chills, fatigue and fever.  HENT: Negative for congestion, rhinorrhea and sore throat.    Respiratory: Negative for cough and shortness of breath.   Cardiovascular: Negative for chest pain.  Gastrointestinal: Positive for rectal pain (and itching). Negative for abdominal pain, constipation, diarrhea, nausea and vomiting.  Genitourinary: Positive for vaginal  pain (Vaginal itching ). Negative for dysuria and urgency.  Musculoskeletal: Negative for back pain and myalgias.  Neurological: Negative for dizziness, weakness, light-headedness and headaches.  Psychiatric/Behavioral: Negative for dysphoric mood. The patient is not nervous/anxious.        Objective:    Physical Exam Vitals reviewed.  Constitutional:      Appearance: Normal appearance. She is obese.  Cardiovascular:     Rate and Rhythm: Normal rate and regular rhythm.     Heart sounds: Normal heart sounds.  Pulmonary:     Effort: Pulmonary effort is normal.     Breath sounds: Normal breath sounds.  Abdominal:     Comments: Erythema around anus.   Neurological:     Mental Status: She is alert.    BP 136/64   Pulse 72   Temp (!) 97.4 F (36.3 C)   Resp 18   Ht 5\' 6"  (1.676 m)   Wt 209 lb (94.8 kg)   BMI 33.73 kg/m  Wt Readings from Last 3 Encounters:  04/05/20 209 lb (94.8 kg)  03/27/20 211 lb (95.7 kg)  03/14/20 212 lb (96.2 kg)    Health Maintenance Due  Topic Date Due  . Hepatitis C Screening  Never done  . PNA vac Low Risk Adult (2 of 2 - PPSV23) 04/13/2017    There are no preventive care reminders to display for this patient.   No results found for: TSH Lab Results  Component Value Date   WBC 6.3 01/28/2020   HGB 11.9 01/28/2020   HCT 35.9 01/28/2020   MCV 97 01/28/2020   PLT 211 01/28/2020   Lab Results  Component Value Date   NA 139 01/28/2020   K 4.1 01/28/2020   CO2 28 01/28/2020   GLUCOSE 128 (H) 01/28/2020   BUN 14 01/28/2020   CREATININE 0.82 01/28/2020   BILITOT 0.5 01/28/2020   ALKPHOS 59 01/28/2020   AST 38 01/28/2020   ALT 30 01/28/2020   PROT 7.1 01/28/2020    ALBUMIN 4.4 01/28/2020   CALCIUM 9.6 01/28/2020   Lab Results  Component Value Date   CHOL 113 01/28/2020   Lab Results  Component Value Date   HDL 40 01/28/2020   Lab Results  Component Value Date   LDLCALC 53 01/28/2020   Lab Results  Component Value Date   TRIG 107 01/28/2020   Lab Results  Component Value Date   CHOLHDL 2.8 01/28/2020   Lab Results  Component Value Date   HGBA1C 6.9 (H) 01/28/2020       Assessment & Plan:  1. Dysuria - POCT urinalysis dipstick normal. No uti. Likely due to combination of atrophic vaginitis and candidiasis.  Stop jardiance as it is likely making this worse.   2. Type 2 diabetes mellitus with diabetic polyneuropathy, without long-term current use of insulin ([place]) Stop jardiance. Start rybelsus 3 mg once daily.  Continue to check sugars daily and keep a log. Let Emeterio Reeve, pharmacist, or myself know how it is working.   3. Candidiasis of anus Fluconazole 100 mg once daily for 7 days.   4. Atrophic vaginitis  Use estrogen cream on labia as well as internally.   5. Diarrhea resolved with discontinuation of metformin.   Meds ordered this encounter  Medications  . fluconazole (DIFLUCAN) 100 MG tablet    Sig: Take 1 tablet (100 mg total) by mouth daily.    Dispense:  7 tablet    Refill:  0    Orders Placed  This Encounter  Procedures  . POCT urinalysis dipstick    Follow-up: January or sooner if not improving.   An After Visit Summary was printed and given to the patient.  Rochel Brome, MD [facility] [phone]

## 2020-04-05 NOTE — Patient Instructions (Signed)
Stop jardiance.  Start rybelsus 3 mg once daily.  Let Emeterio Reeve or myself know how it is working.  Use estrogen cream on labia as well as internally.  Yeast infection around anus.  Fluconazole 100 mg once daily for 7 days.

## 2020-04-14 ENCOUNTER — Other Ambulatory Visit: Payer: Self-pay | Admitting: Family Medicine

## 2020-04-14 ENCOUNTER — Telehealth: Payer: Self-pay

## 2020-04-14 MED ORDER — FLUCONAZOLE 100 MG PO TABS
100.0000 mg | ORAL_TABLET | Freq: Every day | ORAL | 0 refills | Status: DC
Start: 2020-04-14 — End: 2020-05-09

## 2020-04-14 NOTE — Telephone Encounter (Signed)
Megan Galvan called to report that she finished her medication on Monday for her yeast infection and she is extremely red and irritated.  She requested  that you send something different to the pharmacy.

## 2020-04-18 ENCOUNTER — Other Ambulatory Visit: Payer: Self-pay

## 2020-04-18 DIAGNOSIS — E1142 Type 2 diabetes mellitus with diabetic polyneuropathy: Secondary | ICD-10-CM

## 2020-04-18 MED ORDER — ACCU-CHEK SOFTCLIX LANCETS MISC: 12 refills | Status: DC

## 2020-04-18 MED ORDER — ACCU-CHEK AVIVA PLUS VI STRP: ORAL_STRIP | 3 refills | Status: DC

## 2020-05-04 ENCOUNTER — Telehealth: Payer: Self-pay

## 2020-05-04 NOTE — Telephone Encounter (Signed)
Megan Galvan called to report that she is experiencing abdominal pain, constipation and nausea with the Rybelsus.  Marianne Sofia, PA advised that she stop the medication and discuss with Dr. Sedalia Muta when she comes in for follow-up on Tuesday.

## 2020-05-09 ENCOUNTER — Other Ambulatory Visit: Payer: Self-pay

## 2020-05-09 ENCOUNTER — Ambulatory Visit (INDEPENDENT_AMBULATORY_CARE_PROVIDER_SITE_OTHER): Payer: Medicare Other | Admitting: Family Medicine

## 2020-05-09 VITALS — BP 122/72 | HR 66 | Temp 95.4°F | Ht 66.0 in | Wt 216.0 lb

## 2020-05-09 DIAGNOSIS — E1142 Type 2 diabetes mellitus with diabetic polyneuropathy: Secondary | ICD-10-CM

## 2020-05-09 DIAGNOSIS — E782 Mixed hyperlipidemia: Secondary | ICD-10-CM

## 2020-05-09 DIAGNOSIS — E66811 Obesity, class 1: Secondary | ICD-10-CM

## 2020-05-09 DIAGNOSIS — E669 Obesity, unspecified: Secondary | ICD-10-CM

## 2020-05-09 DIAGNOSIS — K219 Gastro-esophageal reflux disease without esophagitis: Secondary | ICD-10-CM | POA: Diagnosis not present

## 2020-05-09 DIAGNOSIS — I10 Essential (primary) hypertension: Secondary | ICD-10-CM

## 2020-05-09 LAB — POCT UA - MICROALBUMIN: Microalbumin Ur, POC: 10 mg/L

## 2020-05-09 MED ORDER — VALSARTAN 320 MG PO TABS: 320.0000 mg | ORAL_TABLET | Freq: Every day | ORAL | 1 refills | Status: DC

## 2020-05-09 MED ORDER — POTASSIUM CHLORIDE CRYS ER 20 MEQ PO TBCR
40.0000 meq | EXTENDED_RELEASE_TABLET | Freq: Every day | ORAL | 1 refills | Status: DC
Start: 2020-05-09 — End: 2020-05-19

## 2020-05-09 MED ORDER — PRAVASTATIN SODIUM 40 MG PO TABS: 40.0000 mg | ORAL_TABLET | Freq: Every day | ORAL | 1 refills | Status: DC

## 2020-05-09 NOTE — Progress Notes (Signed)
Subjective:  Patient ID: Megan Galvan, female    DOB: 11-Dec-1950  Age: 70 y.o. MRN: [id number]  Chief Complaint  Patient presents with  . Diabetes  . Hypertension  . Hyperlipidemia    HPI Diabetes- currently not taking any medications. Eating low sugar. Not exercising. Sugars 108-188.  Hypertension-Valsartan, Chlorthalidone, metoprolol xl. Bp well controlled. Hyperlipidemia-Pravastatin. Low fat diet.  GERD: pepcid and omeprazole.  Current Outpatient Medications on File Prior to Visit  Medication Sig Dispense Refill  . Accu-Chek Softclix Lancets lancets Use as instructed 100 each 12  . acetaminophen (TYLENOL) 500 MG tablet 500 mg as needed.     Marland Kitchen aspirin EC 81 MG tablet Take 81 mg by mouth daily.    . cetirizine (ZYRTEC) 10 MG tablet Take 1 tablet by mouth daily.    . chlorthalidone (HYGROTON) 50 MG tablet Take 1 tablet (50 mg total) by mouth daily. (Patient taking differently: Take 50 mg by mouth in the morning and at bedtime. ) 90 tablet 1  . conjugated estrogens (PREMARIN) vaginal cream Place 1 Applicatorful vaginally 2 (two) times a week. 42.5 g 2  . famotidine (PEPCID) 40 MG tablet TAKE 1 TABLET BY MOUTH AT  BEDTIME 90 tablet 3  . fluticasone (FLONASE) 50 MCG/ACT nasal spray Place 1 spray into the nose as needed.    Marland Kitchen glucose blood (ACCU-CHEK AVIVA PLUS) test strip CHECK BLOOD SUGAR ONCE  DAILY 100 strip 3  . metoprolol succinate (TOPROL-XL) 50 MG 24 hr tablet TAKE ONE-HALF TABLET BY  MOUTH DAILY 45 tablet 3  . omeprazole (PRILOSEC) 40 MG capsule Take 1 capsule (40 mg total) by mouth daily. 90 capsule 1   No current facility-administered medications on file prior to visit.   Past Medical History:  Diagnosis Date  . Allergy   . Anemia   . Arthritis   . Bone spur of acromioclavicular joint, left   . Cataract   . Diabetes mellitus without complication ([place])   . GERD (gastroesophageal reflux disease)   . Heart murmur   . Hyperlipidemia   . Hypertension   .  Nonrheumatic aortic (valve) stenosis   . Plantar fasciitis   . Sleep apnea    cpap   Past Surgical History:  Procedure Laterality Date  . BACK SURGERY  11/17/2016   L3-L5  . BILATERAL CARPAL TUNNEL RELEASE    . bone spur Left    left shoulder  . bone spur Right    Right shoulder  . COLONOSCOPY  08/22/2011   Moderate predominantly sigmoid diverticulosis. Small internal hemorrhoids.   Marland Kitchen HEMORROIDECTOMY  02/2019  . NECK SURGERY    . NECK SURGERY    . torn rotator cuff Right    Right shoulder  . torn rotator cuff Left    left shoulder  . TRIGGER FINGER RELEASE Left    thumb  . TRIGGER FINGER RELEASE Right    thumb and middle finger  . UPPER GASTROINTESTINAL ENDOSCOPY    . WRIST SURGERY Left    torn ligament  . WRIST SURGERY Right    cyst    Family History  Problem Relation Age of Onset  . Colon cancer Paternal Grandmother   . Esophageal cancer Neg Hx   . Rectal cancer Neg Hx   . Stomach cancer Neg Hx    Social History   Socioeconomic History  . Marital status: Married    Spouse name: Not on file  . Number of children: 2  . Years of education: Not  on file  . Highest education level: Not on file  Occupational History  . Not on file  Tobacco Use  . Smoking status: Never Smoker  . Smokeless tobacco: Never Used  Vaping Use  . Vaping Use: Never used  Substance and Sexual Activity  . Alcohol use: Never  . Drug use: Never  . Sexual activity: Not on file  Other Topics Concern  . Not on file  Social History Narrative  . Not on file   Social Determinants of Health   Financial Resource Strain: Not on file  Food Insecurity: No Food Insecurity  . Worried About Charity fundraiser in the Last Year: Never true  . Ran Out of Food in the Last Year: Never true  Transportation Needs: No Transportation Needs  . Lack of Transportation (Medical): No  . Lack of Transportation (Non-Medical): No  Physical Activity: Not on file  Stress: Not on file  Social Connections: Not  on file    Review of Systems  Constitutional: Positive for unexpected weight change (increased). Negative for chills, fatigue and fever.  HENT: Negative for congestion, ear pain, rhinorrhea and sore throat.   Eyes: Negative for visual disturbance.  Respiratory: Negative for cough and shortness of breath.   Cardiovascular: Negative for chest pain.  Gastrointestinal: Positive for constipation (improved. ). Negative for abdominal pain, diarrhea, nausea and vomiting.       Anus tender intermittently.  Genitourinary: Negative for dysuria and urgency.  Musculoskeletal: Negative for back pain and myalgias.  Neurological: Positive for headaches (frequent). Negative for dizziness, weakness and light-headedness.  Psychiatric/Behavioral: Negative for dysphoric mood. The patient is not nervous/anxious.      Objective:  BP 122/72   Pulse 66   Temp (!) 95.4 F (35.2 C)   Ht 5\' 6"  (1.676 m)   Wt 216 lb (98 kg)   SpO2 99%   BMI 34.86 kg/m   BP/Weight 05/09/2020 04/05/2020 40/98/1191  Systolic BP 478 295 621  Diastolic BP 72 64 70  Wt. (Lbs) 216 209 211  BMI 34.86 33.73 34.06    Physical Exam Vitals reviewed.  Constitutional:      Appearance: Normal appearance. She is normal weight.  Neck:     Vascular: No carotid bruit.  Cardiovascular:     Rate and Rhythm: Normal rate and regular rhythm.     Pulses: Normal pulses.     Heart sounds: Normal heart sounds.  Pulmonary:     Effort: Pulmonary effort is normal. No respiratory distress.     Breath sounds: Normal breath sounds.  Abdominal:     General: Abdomen is flat. Bowel sounds are normal.     Palpations: Abdomen is soft.     Tenderness: There is no abdominal tenderness.  Neurological:     Mental Status: She is alert and oriented to person, place, and time.  Psychiatric:        Mood and Affect: Mood normal.        Behavior: Behavior normal.     Lab Results  Component Value Date   WBC 7.2 05/09/2020   HGB 11.7 05/09/2020    HCT 34.7 05/09/2020   PLT 203 05/09/2020   GLUCOSE 131 (H) 05/09/2020   CHOL 116 05/09/2020   TRIG 107 05/09/2020   HDL 45 05/09/2020   LDLCALC 51 05/09/2020   ALT 31 05/09/2020   AST 37 05/09/2020   NA 144 05/09/2020   K 4.4 05/09/2020   CL 102 05/09/2020   CREATININE 0.83 05/09/2020  BUN 16 05/09/2020   CO2 30 (H) 05/09/2020   HGBA1C 6.8 (H) 05/09/2020   MICROALBUR 10 05/09/2020      Assessment & Plan:   1. Type 2 diabetes mellitus with diabetic polyneuropathy, without long-term current use of insulin (HCC) Check sugars daily.  Low sugar diet and exercise.  Check feet daily.  Eye exam annually - CBC with Differential/Platelet - Hemoglobin A1c - POCT UA - Microalbumin  2. Mixed hyperlipidemia Low fat diet.  Continue pravastatin. Start exercise.  - pravastatin (PRAVACHOL) 40 MG tablet; Take 1 tablet (40 mg total) by mouth at bedtime.  Dispense: 90 tablet; Refill: 1 - Lipid panel  3. Essential hypertension Well controlled.  Low salt diet and exercise.  - valsartan (DIOVAN) 320 MG tablet; Take 1 tablet (320 mg total) by mouth daily.  Dispense: 90 tablet; Refill: 1 - Comprehensive metabolic panel  4. Gastroesophageal reflux disease without esophagitis The current medical regimen is effective;  continue present plan and medications.  5. Obesity (BMI 30.0-34.9)  Recommend eating healthy and exercise.  Meds ordered this encounter  Medications  . potassium chloride SA (KLOR-CON) 20 MEQ tablet    Sig: Take 2 tablets (40 mEq total) by mouth daily. with food    Dispense:  270 tablet    Refill:  1    Requesting 1 year supply  . pravastatin (PRAVACHOL) 40 MG tablet    Sig: Take 1 tablet (40 mg total) by mouth at bedtime.    Dispense:  90 tablet    Refill:  1    Requesting 1 year supply  . traZODone (DESYREL) 100 MG tablet    Sig: TAKE 1 TABLET BY MOUTH  BEFORE BEDTIME    Dispense:  90 tablet    Refill:  1    Requesting 1 year supply  . valsartan (DIOVAN) 320  MG tablet    Sig: Take 1 tablet (320 mg total) by mouth daily.    Dispense:  90 tablet    Refill:  1    Requesting 1 year supply    Orders Placed This Encounter  Procedures  . CBC with Differential/Platelet  . Comprehensive metabolic panel  . Hemoglobin A1c  . Lipid panel  . Cardiovascular Risk Assessment  . POCT UA - Microalbumin     Follow-up: Return in about 3 months (around 08/07/2020) for fasting.Marland Kitchen  An After Visit Summary was printed and given to the patient.  Rochel Brome, MD [facility] [phone]

## 2020-05-10 ENCOUNTER — Encounter: Payer: Self-pay | Admitting: Family Medicine

## 2020-05-10 DIAGNOSIS — G4733 Obstructive sleep apnea (adult) (pediatric): Secondary | ICD-10-CM | POA: Diagnosis not present

## 2020-05-10 LAB — CBC WITH DIFFERENTIAL/PLATELET
Basophils Absolute: 0.1 10*3/uL (ref 0.0–0.2)
Basos: 1 %
EOS (ABSOLUTE): 0.1 10*3/uL (ref 0.0–0.4)
Eos: 2 %
Hematocrit: 34.7 % (ref 34.0–46.6)
Hemoglobin: 11.7 g/dL (ref 11.1–15.9)
Immature Grans (Abs): 0 10*3/uL (ref 0.0–0.1)
Immature Granulocytes: 0 %
Lymphocytes Absolute: 2.9 10*3/uL (ref 0.7–3.1)
Lymphs: 40 %
MCH: 32.7 pg (ref 26.6–33.0)
MCHC: 33.7 g/dL (ref 31.5–35.7)
MCV: 97 fL (ref 79–97)
Monocytes Absolute: 0.8 10*3/uL (ref 0.1–0.9)
Monocytes: 12 %
Neutrophils Absolute: 3.3 10*3/uL (ref 1.4–7.0)
Neutrophils: 45 %
Platelets: 203 10*3/uL (ref 150–450)
RBC: 3.58 x10E6/uL — ABNORMAL LOW (ref 3.77–5.28)
RDW: 12.9 % (ref 11.7–15.4)
WBC: 7.2 10*3/uL (ref 3.4–10.8)

## 2020-05-10 LAB — LIPID PANEL
Chol/HDL Ratio: 2.6 ratio (ref 0.0–4.4)
Cholesterol, Total: 116 mg/dL (ref 100–199)
HDL: 45 mg/dL (ref >39)
LDL Chol Calc (NIH): 51 mg/dL (ref 0–99)
Triglycerides: 107 mg/dL (ref 0–149)
VLDL Cholesterol Cal: 20 mg/dL (ref 5–40)

## 2020-05-10 LAB — COMPREHENSIVE METABOLIC PANEL
ALT: 31 IU/L (ref 0–32)
AST: 37 IU/L (ref 0–40)
Albumin/Globulin Ratio: 1.9 (ref 1.2–2.2)
Albumin: 4.3 g/dL (ref 3.8–4.8)
Alkaline Phosphatase: 65 IU/L (ref 44–121)
BUN/Creatinine Ratio: 19 (ref 12–28)
BUN: 16 mg/dL (ref 8–27)
Bilirubin Total: 0.4 mg/dL (ref 0.0–1.2)
CO2: 30 mmol/L — ABNORMAL HIGH (ref 20–29)
Calcium: 9.4 mg/dL (ref 8.7–10.3)
Chloride: 102 mmol/L (ref 96–106)
Creatinine, Ser: 0.83 mg/dL (ref 0.57–1.00)
GFR calc Af Amer: 84 mL/min/{1.73_m2} (ref >59)
GFR calc non Af Amer: 73 mL/min/{1.73_m2} (ref >59)
Globulin, Total: 2.3 g/dL (ref 1.5–4.5)
Glucose: 131 mg/dL — ABNORMAL HIGH (ref 65–99)
Potassium: 4.4 mmol/L (ref 3.5–5.2)
Sodium: 144 mmol/L (ref 134–144)
Total Protein: 6.6 g/dL (ref 6.0–8.5)

## 2020-05-10 LAB — HEMOGLOBIN A1C
Est. average glucose Bld gHb Est-mCnc: 148 mg/dL
Hgb A1c MFr Bld: 6.8 % — ABNORMAL HIGH (ref 4.8–5.6)

## 2020-05-10 LAB — CARDIOVASCULAR RISK ASSESSMENT

## 2020-05-10 MED ORDER — TRAZODONE HCL 100 MG PO TABS
ORAL_TABLET | ORAL | 1 refills | Status: DC
Start: 2020-05-10 — End: 2020-08-15

## 2020-05-11 ENCOUNTER — Other Ambulatory Visit: Payer: Self-pay | Admitting: Family Medicine

## 2020-05-11 MED ORDER — POTASSIUM CHLORIDE 20 MEQ PO PACK: 40.0000 meq | PACK | Freq: Every day | ORAL | 1 refills | Status: DC

## 2020-05-15 ENCOUNTER — Telehealth: Payer: Self-pay

## 2020-05-15 NOTE — Progress Notes (Signed)
Chronic Care Management Pharmacy Assistant   Name: Megan Galvan  MRN: [id number] DOB: 1950-06-14  Reason for Encounter: Disease State call for Diabetes  Patient Questions:  1.  Have you seen any other providers since your last visit? No  2.  Any changes in your medicines or health? Yes,Rilla called to report that she is experiencing abdominal pain, constipation and nausea with the Rybelsus.  [name], PA advised that she stop the medication and discuss with Dr. [name] when she comes in for follow-up on Tuesday.    Patient stated today that she is no longer taking Jardiance due to it causing yeast infections.  She is currently not raking any diabetic medications.    Patient stated she has made request for some medication refills with Dr. [name].    PCP : Rochel Brome, MD  Allergies:   Allergies  Allergen Reactions   Ace Inhibitors Cough   Doxycycline     Rash    Keflex [Cephalexin]    Latex     Burn and itch   Nexium [Esomeprazole Magnesium] Diarrhea   Protonix [Pantoprazole Sodium] Other (See Comments)    Constipation    Medications: Outpatient Encounter Medications as of 05/15/2020  Medication Sig Note   Accu-Chek Softclix Lancets lancets Use as instructed    acetaminophen (TYLENOL) 500 MG tablet 500 mg as needed.     aspirin EC 81 MG tablet Take 81 mg by mouth daily.    cetirizine (ZYRTEC) 10 MG tablet Take 1 tablet by mouth daily.    chlorthalidone (HYGROTON) 50 MG tablet Take 1 tablet (50 mg total) by mouth daily. (Patient taking differently: Take 50 mg by mouth in the morning and at bedtime. )    conjugated estrogens (PREMARIN) vaginal cream Place 1 Applicatorful vaginally 2 (two) times a week.    famotidine (PEPCID) 40 MG tablet TAKE 1 TABLET BY MOUTH AT  BEDTIME    fluticasone (FLONASE) 50 MCG/ACT nasal spray Place 1 spray into the nose as needed. 12/11/2017: 1 spray in each    glucose blood (ACCU-CHEK AVIVA PLUS) test strip CHECK BLOOD SUGAR ONCE   DAILY    metoprolol succinate (TOPROL-XL) 50 MG 24 hr tablet TAKE ONE-HALF TABLET BY  MOUTH DAILY    omeprazole (PRILOSEC) 40 MG capsule Take 1 capsule (40 mg total) by mouth daily.    potassium chloride (KLOR-CON) 20 MEQ packet Take 40 mEq by mouth daily.    potassium chloride SA (KLOR-CON) 20 MEQ tablet Take 2 tablets (40 mEq total) by mouth daily. with food    pravastatin (PRAVACHOL) 40 MG tablet Take 1 tablet (40 mg total) by mouth at bedtime.    traZODone (DESYREL) 100 MG tablet TAKE 1 TABLET BY MOUTH  BEFORE BEDTIME    valsartan (DIOVAN) 320 MG tablet Take 1 tablet (320 mg total) by mouth daily.    No facility-administered encounter medications on file as of 05/15/2020.    Current Diagnosis: Patient Active Problem List   Diagnosis Date Noted   Medicare annual wellness visit, subsequent 03/27/2020   Essential hypertension 10/01/2019   Mixed hyperlipidemia    Vertigo 07/20/2019   Myalgia, other site 06/07/2019   Class 1 obesity due to excess calories with serious comorbidity and body mass index (BMI) of 34.0 to 34.9 in adult 06/07/2019   BMI 35.0-35.9,adult 06/07/2019   Gastroesophageal reflux disease without esophagitis 06/03/2019   Type 2 diabetes mellitus with diabetic polyneuropathy ([place]) 06/03/2019   Primary insomnia 06/03/2019   Recent  Relevant Labs: Lab Results  Component Value Date/Time   HGBA1C 6.8 (H) 05/09/2020 11:32 AM   HGBA1C 6.9 (H) 01/28/2020 11:23 AM   MICROALBUR 10 05/09/2020 11:32 AM   MICROALBUR 30 10/01/2019 10:27 AM    Kidney Function Lab Results  Component Value Date/Time   CREATININE 0.83 05/09/2020 11:32 AM   CREATININE 0.82 01/28/2020 11:23 AM   GFRNONAA 73 05/09/2020 11:32 AM   GFRAA 84 05/09/2020 11:32 AM     Current antihyperglycemic regimen:  Jardiance 10 mg daily (Patient stated she is not taking)   What recent interventions/DTPs have been made to improve glycemic control: Jardiance caused yeast infections        Rybelsus    Have there been any recent hospitalizations or ED visits since last visit with CPP? No    Patient denies hypoglycemic symptoms    Patient denies hyperglycemic symptoms    How often are you checking your blood sugar? once daily    What are your blood sugars ranging?  o Fasting: 128 on 05/15/20 o Before meals: None o After meals: 05/14/20 100 after lunch, 05/13/20  124 after lunch o Bedtime: None   During the week, how often does your blood glucose drop below 70? Never    Are you checking your feet daily/regularly? Patient stated she checks her feet while in the shower daily.  Adherence Review: Is the patient currently on a STATIN medication? Yes Is the patient currently on ACE/ARB medication? Yes Does the patient have >5 day gap between last estimated fill dates? No   Follow-Up:  Pharmacist Review  Donette Larry, CPP  Notified  [name], [place] Pharmacist Assistant [phone]

## 2020-05-17 ENCOUNTER — Other Ambulatory Visit: Payer: Self-pay

## 2020-05-17 MED ORDER — HYDROCORTISONE ACETATE 25 MG RE SUPP: 25.0000 mg | Freq: Two times a day (BID) | RECTAL | 2 refills | Status: DC

## 2020-05-19 ENCOUNTER — Other Ambulatory Visit: Payer: Self-pay

## 2020-05-19 ENCOUNTER — Other Ambulatory Visit: Payer: Self-pay | Admitting: Family Medicine

## 2020-05-19 MED ORDER — POTASSIUM CHLORIDE CRYS ER 20 MEQ PO TBCR: EXTENDED_RELEASE_TABLET | ORAL | 1 refills | Status: DC

## 2020-05-21 ENCOUNTER — Other Ambulatory Visit: Payer: Self-pay | Admitting: Family Medicine

## 2020-06-01 ENCOUNTER — Telehealth (INDEPENDENT_AMBULATORY_CARE_PROVIDER_SITE_OTHER): Payer: Medicare Other | Admitting: Nurse Practitioner

## 2020-06-01 ENCOUNTER — Encounter: Payer: Self-pay | Admitting: Nurse Practitioner

## 2020-06-01 VITALS — BP 140/58 | HR 60 | Temp 97.6°F | Ht 66.0 in | Wt 216.0 lb

## 2020-06-01 DIAGNOSIS — J019 Acute sinusitis, unspecified: Secondary | ICD-10-CM | POA: Diagnosis not present

## 2020-06-01 MED ORDER — AZITHROMYCIN 250 MG PO TABS: ORAL_TABLET | ORAL | 0 refills | Status: DC

## 2020-06-01 NOTE — Progress Notes (Signed)
Virtual Visit via Telephone Note   This visit type was conducted due to national recommendations for restrictions regarding the COVID-19 Pandemic (e.g. social distancing) in an effort to limit this patient's exposure and mitigate transmission in our community.  Due to her co-morbid illnesses, this patient is at least at moderate risk for complications without adequate follow up.  This format is felt to be most appropriate for this patient at this time.  The patient did not have access to video technology/had technical difficulties with video requiring transitioning to audio format only (telephone).  All issues noted in this document were discussed and addressed.  No physical exam could be performed with this format.  Patient verbally consented to a telehealth visit.   Date:  06/01/2020   ID:  Megan Galvan, DOB 26-Dec-1950, MRN [id number]  Patient Location: Home Provider Location: Office/Clinic  PCP:  Rochel Brome, MD   Evaluation Performed:  Established patient, acute telemedicine visit  Chief Complaint:  Sinus pain  History of Present Illness:    Megan Galvan is a 70 y.o. female with bloody sinus drainage, sinus pressure/pain, headache, and states her "teeth hurt". Onset was one-day-ago. Treatment has included Flonase, Claritin, and pushing fluids. She states she has a history of allergic rhinitis and chronic sinus problems. She has declined COVID-19 testing today. She denies exposure to ill-contacts.   The patient does have symptoms concerning for COVID-19 infection (fever, chills, cough, or new shortness of breath).    Past Medical History:  Diagnosis Date  . Allergy   . Anemia   . Arthritis   . Bone spur of acromioclavicular joint, left   . Cataract   . Diabetes mellitus without complication ([place])   . GERD (gastroesophageal reflux disease)   . Heart murmur   . Hyperlipidemia   . Hypertension   . Nonrheumatic aortic (valve) stenosis   . Plantar fasciitis   . Sleep apnea     cpap    Past Surgical History:  Procedure Laterality Date  . BACK SURGERY  11/17/2016   L3-L5  . BILATERAL CARPAL TUNNEL RELEASE    . bone spur Left    left shoulder  . bone spur Right    Right shoulder  . COLONOSCOPY  08/22/2011   Moderate predominantly sigmoid diverticulosis. Small internal hemorrhoids.   Marland Kitchen HEMORROIDECTOMY  02/2019  . NECK SURGERY    . NECK SURGERY    . torn rotator cuff Right    Right shoulder  . torn rotator cuff Left    left shoulder  . TRIGGER FINGER RELEASE Left    thumb  . TRIGGER FINGER RELEASE Right    thumb and middle finger  . UPPER GASTROINTESTINAL ENDOSCOPY    . WRIST SURGERY Left    torn ligament  . WRIST SURGERY Right    cyst    Family History  Problem Relation Age of Onset  . Colon cancer Paternal Grandmother   . Esophageal cancer Neg Hx   . Rectal cancer Neg Hx   . Stomach cancer Neg Hx     Social History   Socioeconomic History  . Marital status: Married    Spouse name: Not on file  . Number of children: 2  . Years of education: Not on file  . Highest education level: Not on file  Occupational History  . Not on file  Tobacco Use  . Smoking status: Never Smoker  . Smokeless tobacco: Never Used  Vaping Use  . Vaping Use: Never used  Substance and Sexual Activity  . Alcohol use: Never  . Drug use: Never  . Sexual activity: Not on file  Other Topics Concern  . Not on file  Social History Narrative  . Not on file   Social Determinants of Health   Financial Resource Strain: Not on file  Food Insecurity: No Food Insecurity  . Worried About Charity fundraiser in the Last Year: Never true  . Ran Out of Food in the Last Year: Never true  Transportation Needs: No Transportation Needs  . Lack of Transportation (Medical): No  . Lack of Transportation (Non-Medical): No  Physical Activity: Not on file  Stress: Not on file  Social Connections: Not on file  Intimate Partner Violence: Not on file    Outpatient  Medications Prior to Visit  Medication Sig Dispense Refill  . hydrocortisone (ANUSOL-HC) 25 MG suppository Place 1 suppository (25 mg total) rectally 2 (two) times daily. 14 suppository 2  . Accu-Chek Softclix Lancets lancets Use as instructed 100 each 12  . acetaminophen (TYLENOL) 500 MG tablet 500 mg as needed.     Marland Kitchen aspirin EC 81 MG tablet Take 81 mg by mouth daily.    . cetirizine (ZYRTEC) 10 MG tablet Take 1 tablet by mouth daily.    . chlorthalidone (HYGROTON) 50 MG tablet Take 1 tablet (50 mg total) by mouth daily. (Patient taking differently: Take 50 mg by mouth in the morning and at bedtime. ) 90 tablet 1  . conjugated estrogens (PREMARIN) vaginal cream Place 1 Applicatorful vaginally 2 (two) times a week. 42.5 g 2  . famotidine (PEPCID) 40 MG tablet TAKE 1 TABLET BY MOUTH AT BEDTIME 90 tablet 3  . fluticasone (FLONASE) 50 MCG/ACT nasal spray Place 1 spray into the nose as needed.    Marland Kitchen glucose blood (ACCU-CHEK AVIVA PLUS) test strip CHECK BLOOD SUGAR ONCE  DAILY 100 strip 3  . metoprolol succinate (TOPROL-XL) 50 MG 24 hr tablet TAKE ONE-HALF TABLET BY  MOUTH DAILY 45 tablet 3  . omeprazole (PRILOSEC) 40 MG capsule Take 1 capsule (40 mg total) by mouth daily. 90 capsule 1  . potassium chloride SA (KLOR-CON) 20 MEQ tablet 2 in am and 1 at night. 270 tablet 1  . pravastatin (PRAVACHOL) 40 MG tablet Take 1 tablet (40 mg total) by mouth at bedtime. 90 tablet 1  . traZODone (DESYREL) 100 MG tablet TAKE 1 TABLET BY MOUTH  BEFORE BEDTIME 90 tablet 1  . valsartan (DIOVAN) 320 MG tablet Take 1 tablet (320 mg total) by mouth daily. 90 tablet 1   No facility-administered medications prior to visit.    Allergies:   Ace inhibitors, Doxycycline, Keflex [cephalexin], Latex, Nexium [esomeprazole magnesium], and Protonix [pantoprazole sodium]   Social History   Tobacco Use  . Smoking status: Never Smoker  . Smokeless tobacco: Never Used  Vaping Use  . Vaping Use: Never used  Substance Use  Topics  . Alcohol use: Never  . Drug use: Never     Review of Systems  Constitutional: Negative for chills and malaise/fatigue.  HENT: Positive for congestion, nosebleeds and sinus pain. Negative for ear pain and sore throat.        "Teeth hurt"  Eyes: Negative for pain.  Respiratory: Negative for cough and shortness of breath.   Cardiovascular: Negative for chest pain and orthopnea.  Gastrointestinal: Negative for abdominal pain, constipation, diarrhea, nausea and vomiting.  Genitourinary: Negative for dysuria, frequency and urgency.  Musculoskeletal: Negative for back pain, joint  pain and myalgias.  Skin: Negative for rash.  Neurological: Positive for headaches.     Labs/Other Tests and Data Reviewed:    Recent Labs: 06/03/2019: Magnesium 1.7 05/09/2020: ALT 31; BUN 16; Creatinine, Ser 0.83; Hemoglobin 11.7; Platelets 203; Potassium 4.4; Sodium 144   Recent Lipid Panel Lab Results  Component Value Date/Time   CHOL 116 05/09/2020 11:32 AM   TRIG 107 05/09/2020 11:32 AM   HDL 45 05/09/2020 11:32 AM   CHOLHDL 2.6 05/09/2020 11:32 AM   LDLCALC 51 05/09/2020 11:32 AM    Wt Readings from Last 3 Encounters:  05/09/20 216 lb (98 kg)  04/05/20 209 lb (94.8 kg)  03/27/20 211 lb (95.7 kg)     Objective:    Vital Signs:  BP (!) 140/58   Pulse 60   Temp 97.6 F (36.4 C)   Ht 5\' 6"  (1.676 m)   Wt 216 lb (98 kg)   BMI 34.86 kg/m    Physical Exam Vitals reviewed.    No physical exam due to telemedicine visit  ASSESSMENT & PLAN:    1. Acute non-recurrent sinusitis, unspecified location - azithromycin (ZITHROMAX) 250 MG tablet; Take two tablets by mouth on day one, take one tablet by mouth days two-five  Dispense: 6 tablet; Refill: 0  Rest and push fluids Notify office if symptoms fail to improve    COVID-19 Education: The signs and symptoms of COVID-19 were discussed with the patient and how to seek care for testing (follow up with PCP or arrange E-visit). The  importance of social distancing was discussed today.   I spent 10 minutes dedicated to the care of this patient on the date of this encounter to include telephone time with the patient, as well as: EMR review and prescription medication managment  Follow Up:  Virtual Visit  prn  Signed,  Rip Harbour, NP  06/01/2020 11:06 AM    [place]

## 2020-06-19 ENCOUNTER — Telehealth: Payer: Self-pay

## 2020-06-19 ENCOUNTER — Telehealth (INDEPENDENT_AMBULATORY_CARE_PROVIDER_SITE_OTHER): Payer: Medicare Other | Admitting: Nurse Practitioner

## 2020-06-19 ENCOUNTER — Encounter: Payer: Self-pay | Admitting: Nurse Practitioner

## 2020-06-19 DIAGNOSIS — R9431 Abnormal electrocardiogram [ECG] [EKG]: Secondary | ICD-10-CM | POA: Diagnosis not present

## 2020-06-19 DIAGNOSIS — G4733 Obstructive sleep apnea (adult) (pediatric): Secondary | ICD-10-CM

## 2020-06-19 DIAGNOSIS — D649 Anemia, unspecified: Secondary | ICD-10-CM | POA: Diagnosis not present

## 2020-06-19 DIAGNOSIS — R0602 Shortness of breath: Secondary | ICD-10-CM | POA: Diagnosis not present

## 2020-06-19 DIAGNOSIS — R079 Chest pain, unspecified: Secondary | ICD-10-CM | POA: Diagnosis not present

## 2020-06-19 DIAGNOSIS — E876 Hypokalemia: Secondary | ICD-10-CM | POA: Diagnosis not present

## 2020-06-19 DIAGNOSIS — R0601 Orthopnea: Secondary | ICD-10-CM | POA: Diagnosis not present

## 2020-06-19 DIAGNOSIS — R0789 Other chest pain: Secondary | ICD-10-CM | POA: Diagnosis not present

## 2020-06-19 DIAGNOSIS — R7989 Other specified abnormal findings of blood chemistry: Secondary | ICD-10-CM | POA: Diagnosis not present

## 2020-06-19 NOTE — Telephone Encounter (Signed)
Patient was speaking with our nurse Lauren about her sinus symptoms for her telephone appointment with Larene Beach. Pt asked the nurse who she was seeing, Lauren stated Larene Beach, NP. Pt stated well I need to make another appointment with Dr. [name] for other things. She was then transferred upfront to schedule an in office appointment with Dr.Cox. After speaking with the patient, I learning that she has been experiencing sporadic chest pain radiating to her neck. Pain level is a 3/10 when the chest pain occurs. Chest pain started 3 weeks about. Pt stated she had chest pain with laying down today. Her BP was 143/69, her husband told her to go rest before rechecking her BP. Pt laid in bed for 30 minutes, rechecked BP to find that it was elevated to 152/65. Dr. [name] notified. Per Dr. [name], pt needs to have a rapid COVID test if negative pt can be seen in the office. But she needs to be seen today. Dr. [name] does not have any openings, nor did the other providers. Unable to work the pt in. Pt was notified that she needs to be seen today, either at [facility] or at the ED.

## 2020-06-19 NOTE — Telephone Encounter (Signed)
Pt called left message that sinus infection symptoms returned after completion of antibiotic.   Attempted to call mobile number, call could not be completed.   Called home phone. Pt states 2-3 weeks ago, azithromycin was called in. She felt she was getting better but is now worse again. Her head "is killing me." She also is "blwoing out blood." Was not tested for COVID. Did have covid shots, 3. Did have flu shot. No exposures known. Appointment scheduled.

## 2020-06-19 NOTE — Progress Notes (Deleted)
Virtual Visit via Telephone Note   This visit type was conducted due to national recommendations for restrictions regarding the COVID-19 Pandemic (e.g. social distancing) in an effort to limit this patient's exposure and mitigate transmission in our community.  Due to her co-morbid illnesses, this patient is at least at moderate risk for complications without adequate follow up.  This format is felt to be most appropriate for this patient at this time.  The patient did not have access to video technology/had technical difficulties with video requiring transitioning to audio format only (telephone).  All issues noted in this document were discussed and addressed.  No physical exam could be performed with this format.  Patient verbally consented to a telehealth visit.   Date:  06/19/2020   ID:  Megan Galvan, DOB 02/25/1951, MRN [id number]  {Patient [phone]::"Home"} {Provider Location:[id number]::"Home Office"}  PCP:  Megan Brome, MD   Evaluation Performed:  {Choose Visit VPXT:[id number]::"EVOJJK-KX Visit"}  Chief Complaint:  ***  History of Present Illness:    Megan Galvan is a 70 y.o. female with ***  The patient {does/does not:200015} have symptoms concerning for COVID-19 infection (fever, chills, cough, or new shortness of breath).    Past Medical History:  Diagnosis Date  . Allergy   . Anemia   . Arthritis   . Bone spur of acromioclavicular joint, left   . Cataract   . Diabetes mellitus without complication ([place])   . GERD (gastroesophageal reflux disease)   . Heart murmur   . Hyperlipidemia   . Hypertension   . Nonrheumatic aortic (valve) stenosis   . Plantar fasciitis   . Sleep apnea    cpap    Past Surgical History:  Procedure Laterality Date  . BACK SURGERY  11/17/2016   L3-L5  . BILATERAL CARPAL TUNNEL RELEASE    . bone spur Left    left shoulder  . bone spur Right    Right shoulder  . COLONOSCOPY  08/22/2011   Moderate predominantly  sigmoid diverticulosis. Small internal hemorrhoids.   Marland Kitchen HEMORROIDECTOMY  02/2019  . NECK SURGERY    . NECK SURGERY    . torn rotator cuff Right    Right shoulder  . torn rotator cuff Left    left shoulder  . TRIGGER FINGER RELEASE Left    thumb  . TRIGGER FINGER RELEASE Right    thumb and middle finger  . UPPER GASTROINTESTINAL ENDOSCOPY    . WRIST SURGERY Left    torn ligament  . WRIST SURGERY Right    cyst    Family History  Problem Relation Age of Onset  . Colon cancer Paternal Grandmother   . Esophageal cancer Neg Hx   . Rectal cancer Neg Hx   . Stomach cancer Neg Hx     Social History   Socioeconomic History  . Marital status: Married    Spouse name: Not on file  . Number of children: 2  . Years of education: Not on file  . Highest education level: Not on file  Occupational History  . Not on file  Tobacco Use  . Smoking status: Never Smoker  . Smokeless tobacco: Never Used  Vaping Use  . Vaping Use: Never used  Substance and Sexual Activity  . Alcohol use: Never  . Drug use: Never  . Sexual activity: Not on file  Other Topics Concern  . Not on file  Social History Narrative  . Not on file   Social Determinants of Health  Financial Resource Strain: Not on file  Food Insecurity: No Food Insecurity  . Worried About Charity fundraiser in the Last Year: Never true  . Ran Out of Food in the Last Year: Never true  Transportation Needs: No Transportation Needs  . Lack of Transportation (Medical): No  . Lack of Transportation (Non-Medical): No  Physical Activity: Not on file  Stress: Not on file  Social Connections: Not on file  Intimate Partner Violence: Not on file    Outpatient Medications Prior to Visit  Medication Sig Dispense Refill  . Accu-Chek Softclix Lancets lancets Use as instructed 100 each 12  . acetaminophen (TYLENOL) 500 MG tablet 500 mg as needed.     Marland Kitchen aspirin EC 81 MG tablet Take 81 mg by mouth daily.    . cetirizine (ZYRTEC) 10  MG tablet Take 1 tablet by mouth daily.    . chlorthalidone (HYGROTON) 50 MG tablet Take 1 tablet (50 mg total) by mouth daily. (Patient taking differently: Take 50 mg by mouth in the morning and at bedtime.) 90 tablet 1  . conjugated estrogens (PREMARIN) vaginal cream Place 1 Applicatorful vaginally 2 (two) times a week. 42.5 g 2  . famotidine (PEPCID) 40 MG tablet TAKE 1 TABLET BY MOUTH AT BEDTIME 90 tablet 3  . fluticasone (FLONASE) 50 MCG/ACT nasal spray Place 1 spray into the nose as needed.    Marland Kitchen glucose blood (ACCU-CHEK AVIVA PLUS) test strip CHECK BLOOD SUGAR ONCE  DAILY 100 strip 3  . hydrocortisone (ANUSOL-HC) 25 MG suppository Place 1 suppository (25 mg total) rectally 2 (two) times daily. 14 suppository 2  . metoprolol succinate (TOPROL-XL) 50 MG 24 hr tablet TAKE ONE-HALF TABLET BY  MOUTH DAILY 45 tablet 3  . omeprazole (PRILOSEC) 40 MG capsule Take 1 capsule (40 mg total) by mouth daily. 90 capsule 1  . potassium chloride SA (KLOR-CON) 20 MEQ tablet 2 in am and 1 at night. 270 tablet 1  . pravastatin (PRAVACHOL) 40 MG tablet Take 1 tablet (40 mg total) by mouth at bedtime. 90 tablet 1  . traZODone (DESYREL) 100 MG tablet TAKE 1 TABLET BY MOUTH  BEFORE BEDTIME 90 tablet 1  . valsartan (DIOVAN) 320 MG tablet Take 1 tablet (320 mg total) by mouth daily. 90 tablet 1  . azithromycin (ZITHROMAX) 250 MG tablet Take two tablets by mouth on day one, take one tablet by mouth days two-five 6 tablet 0   No facility-administered medications prior to visit.    Allergies:   Ace inhibitors, Doxycycline, Keflex [cephalexin], Latex, Nexium [esomeprazole magnesium], and Protonix [pantoprazole sodium]   Social History   Tobacco Use  . Smoking status: Never Smoker  . Smokeless tobacco: Never Used  Vaping Use  . Vaping Use: Never used  Substance Use Topics  . Alcohol use: Never  . Drug use: Never     Review of Systems  Constitutional: Negative for chills, fever and malaise/fatigue.  HENT:  Positive for congestion and sinus pain. Negative for ear pain and sore throat.   Respiratory: Negative for cough, sputum production, shortness of breath and wheezing.   Gastrointestinal: Negative for abdominal pain, diarrhea, heartburn, nausea and vomiting.  Genitourinary: Negative for dysuria, frequency and urgency.  Musculoskeletal: Negative for joint pain and myalgias.  Neurological: Positive for headaches. Negative for dizziness.     Labs/Other Tests and Data Reviewed:    Recent Labs: 05/09/2020: ALT 31; BUN 16; Creatinine, Ser 0.83; Hemoglobin 11.7; Platelets 203; Potassium 4.4; Sodium 144  Recent Lipid Panel Lab Results  Component Value Date/Time   CHOL 116 05/09/2020 11:32 AM   TRIG 107 05/09/2020 11:32 AM   HDL 45 05/09/2020 11:32 AM   CHOLHDL 2.6 05/09/2020 11:32 AM   LDLCALC 51 05/09/2020 11:32 AM    Wt Readings from Last 3 Encounters:  06/01/20 216 lb (98 kg)  05/09/20 216 lb (98 kg)  04/05/20 209 lb (94.8 kg)     Objective:    Vital Signs:  There were no vitals taken for this visit.   Physical Exam   ASSESSMENT & PLAN:   There are no diagnoses linked to this encounter.   No orders of the defined types were placed in this encounter.    No orders of the defined types were placed in this encounter.   COVID-19 Education: The signs and symptoms of COVID-19 were discussed with the patient and how to seek care for testing (follow up with PCP or arrange E-visit). The importance of social distancing was discussed today.   I spent < time > minutes dedicated to the care of this patient on the date of this encounter to include face-to-face time with the patient, as well as: ***  Follow Up:  {F/U Format:[phone]} {follow up:15908}  Signed,  Oleta Mouse, CMA  06/19/2020 2:30 PM    [place]

## 2020-06-20 DIAGNOSIS — R9431 Abnormal electrocardiogram [ECG] [EKG]: Secondary | ICD-10-CM | POA: Diagnosis not present

## 2020-06-22 ENCOUNTER — Encounter: Payer: Self-pay | Admitting: Nurse Practitioner

## 2020-06-22 ENCOUNTER — Ambulatory Visit (INDEPENDENT_AMBULATORY_CARE_PROVIDER_SITE_OTHER): Payer: Medicare Other | Admitting: Nurse Practitioner

## 2020-06-22 ENCOUNTER — Other Ambulatory Visit: Payer: Self-pay

## 2020-06-22 VITALS — BP 140/68 | HR 62 | Temp 97.3°F | Ht 66.0 in | Wt 213.0 lb

## 2020-06-22 DIAGNOSIS — R079 Chest pain, unspecified: Secondary | ICD-10-CM

## 2020-06-22 DIAGNOSIS — R011 Cardiac murmur, unspecified: Secondary | ICD-10-CM | POA: Diagnosis not present

## 2020-06-22 DIAGNOSIS — J0100 Acute maxillary sinusitis, unspecified: Secondary | ICD-10-CM

## 2020-06-22 DIAGNOSIS — R7989 Other specified abnormal findings of blood chemistry: Secondary | ICD-10-CM

## 2020-06-22 DIAGNOSIS — R0602 Shortness of breath: Secondary | ICD-10-CM | POA: Diagnosis not present

## 2020-06-22 DIAGNOSIS — H6502 Acute serous otitis media, left ear: Secondary | ICD-10-CM

## 2020-06-22 MED ORDER — SULFAMETHOXAZOLE-TRIMETHOPRIM 800-160 MG PO TABS: 1.0000 | ORAL_TABLET | Freq: Two times a day (BID) | ORAL | 0 refills | Status: DC

## 2020-06-22 NOTE — Progress Notes (Signed)
Established Patient Office Visit  Subjective:  Patient ID: Megan Galvan, female    DOB: Sep 21, 1950  Age: 70 y.o. MRN: [id number]  CC:  Chief Complaint  Patient presents with  . Hospitalization Follow-up    Went to [facility] ER for chest pain.    HPI Megan Galvan is a 70 year old Caucasian female that presents for hospital follow-up for chest pain and recurrent sinus congestion. Megan Galvan was seen in ED at [facility] 3-days ago on -06/19/20 for chest pain and dyspnea. BNP was elevated at 255, Tropon I less than 18. She discharged and scheduled for echocardiogram as outpatient. She states she has not been evaluated by cardiologist. She tells me that she has a history of heart murmur that was discovered in her 74's. States "it must have gotten louder". She denies current chest pain or dyspnea. She has a past medical history of hypertension, hyperlipidemia, and type 2 DM.   Jamyia was recently treated for sinusitis with a course of Azithromycin on 06/01/20. She states she improved initially but symptoms did not subside entirely. She states she has pressure/popping to left ear and nasal congestion. Treatments have included Zyrtec 10 mg daily and Flonase nasal spray.     Past Medical History:  Diagnosis Date  . Allergy   . Anemia   . Arthritis   . Bone spur of acromioclavicular joint, left   . Cataract   . Diabetes mellitus without complication ([place])   . GERD (gastroesophageal reflux disease)   . Heart murmur   . Hyperlipidemia   . Hypertension   . Nonrheumatic aortic (valve) stenosis   . Plantar fasciitis   . Sleep apnea    cpap    Past Surgical History:  Procedure Laterality Date  . BACK SURGERY  11/17/2016   L3-L5  . BILATERAL CARPAL TUNNEL RELEASE    . bone spur Left    left shoulder  . bone spur Right    Right shoulder  . COLONOSCOPY  08/22/2011   Moderate predominantly sigmoid diverticulosis. Small internal hemorrhoids.   Marland Kitchen HEMORROIDECTOMY  02/2019  .  NECK SURGERY    . NECK SURGERY    . torn rotator cuff Right    Right shoulder  . torn rotator cuff Left    left shoulder  . TRIGGER FINGER RELEASE Left    thumb  . TRIGGER FINGER RELEASE Right    thumb and middle finger  . UPPER GASTROINTESTINAL ENDOSCOPY    . WRIST SURGERY Left    torn ligament  . WRIST SURGERY Right    cyst    Family History  Problem Relation Age of Onset  . Colon cancer Paternal Grandmother   . Esophageal cancer Neg Hx   . Rectal cancer Neg Hx   . Stomach cancer Neg Hx     Social History   Socioeconomic History  . Marital status: Married    Spouse name: Not on file  . Number of children: 2  . Years of education: Not on file  . Highest education level: Not on file  Occupational History  . Not on file  Tobacco Use  . Smoking status: Never Smoker  . Smokeless tobacco: Never Used  Vaping Use  . Vaping Use: Never used  Substance and Sexual Activity  . Alcohol use: Never  . Drug use: Never  . Sexual activity: Not on file  Other Topics Concern  . Not on file  Social History Narrative  . Not on file  Social Determinants of Health   Financial Resource Strain: Not on file  Food Insecurity: No Food Insecurity  . Worried About Charity fundraiser in the Last Year: Never true  . Ran Out of Food in the Last Year: Never true  Transportation Needs: No Transportation Needs  . Lack of Transportation (Medical): No  . Lack of Transportation (Non-Medical): No  Physical Activity: Not on file  Stress: Not on file  Social Connections: Not on file  Intimate Partner Violence: Not on file    Outpatient Medications Prior to Visit  Medication Sig Dispense Refill  . Accu-Chek Softclix Lancets lancets Use as instructed 100 each 12  . acetaminophen (TYLENOL) 500 MG tablet 500 mg as needed.     Marland Kitchen aspirin EC 81 MG tablet Take 81 mg by mouth daily.    . cetirizine (ZYRTEC) 10 MG tablet Take 1 tablet by mouth daily.    . chlorthalidone (HYGROTON) 50 MG tablet  Take 1 tablet (50 mg total) by mouth daily. (Patient taking differently: Take 50 mg by mouth in the morning and at bedtime.) 90 tablet 1  . conjugated estrogens (PREMARIN) vaginal cream Place 1 Applicatorful vaginally 2 (two) times a week. 42.5 g 2  . famotidine (PEPCID) 40 MG tablet TAKE 1 TABLET BY MOUTH AT BEDTIME 90 tablet 3  . fluticasone (FLONASE) 50 MCG/ACT nasal spray Place 1 spray into the nose as needed.    Marland Kitchen glucose blood (ACCU-CHEK AVIVA PLUS) test strip CHECK BLOOD SUGAR ONCE  DAILY 100 strip 3  . hydrocortisone (ANUSOL-HC) 25 MG suppository Place 1 suppository (25 mg total) rectally 2 (two) times daily. 14 suppository 2  . metoprolol succinate (TOPROL-XL) 50 MG 24 hr tablet TAKE ONE-HALF TABLET BY  MOUTH DAILY 45 tablet 3  . omeprazole (PRILOSEC) 40 MG capsule Take 1 capsule (40 mg total) by mouth daily. 90 capsule 1  . potassium chloride SA (KLOR-CON) 20 MEQ tablet 2 in am and 1 at night. 270 tablet 1  . pravastatin (PRAVACHOL) 40 MG tablet Take 1 tablet (40 mg total) by mouth at bedtime. 90 tablet 1  . traZODone (DESYREL) 100 MG tablet TAKE 1 TABLET BY MOUTH  BEFORE BEDTIME 90 tablet 1  . valsartan (DIOVAN) 320 MG tablet Take 1 tablet (320 mg total) by mouth daily. 90 tablet 1   No facility-administered medications prior to visit.    Allergies  Allergen Reactions  . Ace Inhibitors Cough  . Doxycycline     Rash   . Keflex [Cephalexin]   . Latex     Burn and itch  . Nexium [Esomeprazole Magnesium] Diarrhea  . Protonix [Pantoprazole Sodium] Other (See Comments)    Constipation    ROS Review of Systems  Constitutional: Negative for appetite change, fatigue and unexpected weight change.  HENT: Positive for congestion (sinus pressure), ear pain (left ear "popping" and pressure), postnasal drip, rhinorrhea and sinus pain. Negative for sinus pressure and tinnitus.   Eyes: Positive for visual disturbance (cataracts). Negative for pain.  Respiratory: Positive for cough and  shortness of breath (intermittent).   Cardiovascular: Positive for chest pain (intermittent). Negative for palpitations and leg swelling.  Gastrointestinal: Negative for abdominal pain, constipation, diarrhea, nausea and vomiting.  Endocrine: Negative for cold intolerance, heat intolerance, polydipsia, polyphagia and polyuria.  Genitourinary: Negative for dysuria, frequency and hematuria.  Musculoskeletal: Negative for arthralgias, back pain, joint swelling and myalgias.  Skin: Negative for rash.  Allergic/Immunologic: Negative for environmental allergies.  Neurological: Positive for headaches. Negative  for dizziness.  Hematological: Negative for adenopathy.  Psychiatric/Behavioral: Negative for decreased concentration and sleep disturbance. The patient is not nervous/anxious.       Objective:    Physical Exam Vitals reviewed.  Constitutional:      Appearance: Normal appearance. She is obese.  HENT:     Head: Normocephalic.     Right Ear: Tympanic membrane normal.     Left Ear: Tenderness present. A middle ear effusion is present.     Nose: Congestion and rhinorrhea present.     Right Turbinates: Swollen.     Left Turbinates: Swollen.     Right Sinus: Maxillary sinus tenderness present.     Left Sinus: Maxillary sinus tenderness present.     Mouth/Throat:     Mouth: Mucous membranes are moist.  Cardiovascular:     Rate and Rhythm: Normal rate and regular rhythm.     Pulses: Normal pulses.     Heart sounds: Murmur heard.    Pulmonary:     Effort: Pulmonary effort is normal.     Breath sounds: Normal breath sounds.  Abdominal:     General: Bowel sounds are normal.     Palpations: Abdomen is soft.     Tenderness: There is no abdominal tenderness. There is no guarding.  Skin:    General: Skin is warm and dry.     Capillary Refill: Capillary refill takes less than 2 seconds.  Neurological:     Mental Status: She is alert and oriented to person, place, and time.   Psychiatric:        Mood and Affect: Mood normal.        Behavior: Behavior normal.     BP 140/68 (BP Location: Left Arm, Patient Position: Sitting)   Pulse 62   Temp (!) 97.3 F (36.3 C) (Temporal)   Ht 5\' 6"  (1.676 m)   Wt 213 lb (96.6 kg)   SpO2 97%   BMI 34.38 kg/m  Wt Readings from Last 3 Encounters:  06/22/20 213 lb (96.6 kg)  06/01/20 216 lb (98 kg)  05/09/20 216 lb (98 kg)     Health Maintenance Due  Topic Date Due  . Hepatitis C Screening  Never done  . PNA vac Low Risk Adult (2 of 2 - PPSV23) 04/13/2017      Lab Results  Component Value Date   WBC 7.2 05/09/2020   HGB 11.7 05/09/2020   HCT 34.7 05/09/2020   MCV 97 05/09/2020   PLT 203 05/09/2020   Lab Results  Component Value Date   NA 144 05/09/2020   K 4.4 05/09/2020   CO2 30 (H) 05/09/2020   GLUCOSE 131 (H) 05/09/2020   BUN 16 05/09/2020   CREATININE 0.83 05/09/2020   BILITOT 0.4 05/09/2020   ALKPHOS 65 05/09/2020   AST 37 05/09/2020   ALT 31 05/09/2020   PROT 6.6 05/09/2020   ALBUMIN 4.3 05/09/2020   CALCIUM 9.4 05/09/2020   Lab Results  Component Value Date   CHOL 116 05/09/2020   Lab Results  Component Value Date   HDL 45 05/09/2020   Lab Results  Component Value Date   LDLCALC 51 05/09/2020   Lab Results  Component Value Date   TRIG 107 05/09/2020   Lab Results  Component Value Date   CHOLHDL 2.6 05/09/2020   Lab Results  Component Value Date   HGBA1C 6.8 (H) 05/09/2020      Assessment & Plan:   1. Heart murmur - Ambulatory referral to  Cardiology  2. Elevated brain natriuretic peptide (BNP) level -Keep echocardiogram appointment -Follow-up with cardiology as ordered  3. Chest pain at rest -Take prescribed cardiac medications -Seek emergency medical care for severe chest pain  4. Shortness of breath -Rest when short of breath -Seek emergency medical care for shortness of breath  5. Acute maxillary sinusitis, recurrence not specified -  sulfamethoxazole-trimethoprim (BACTRIM DS) 800-160 MG tablet; Take 1 tablet by mouth 2 (two) times daily.  Dispense: 14 tablet; Refill: 0  6. Acute serous otitis media of left ear, recurrence not specified - sulfamethoxazole-trimethoprim (BACTRIM DS) 800-160 MG tablet; Take 1 tablet by mouth 2 (two) times daily.  Dispense: 14 tablet; Refill: 0    Keep echocardiogram appointment as scheduled We will call you with cardiology appointment Seek emergency medical care for chest pain and shortness of breath Take Bactrim DS twice daily for 7 days for left ear infection and sinusitis Continue Flonase and Zyrtec daily  Follow-up in 4-weeks with Dr [name]           Follow-up:  4-weeks   Signed,  Jerrell Belfast, DNP

## 2020-06-22 NOTE — Patient Instructions (Addendum)
Keep echocardiogram appointment as scheduled We will call you with cardiology appointment Seek emergency medical care for chest pain and shortness of breath Take Bactrim DS twice daily for 7 days for left ear infection and sinusitis Continue Flonase and Zyrtec daily  Follow-up in 4-weeks with Dr [name]                                                                                                                                                                                                                                   Heart Murmur A heart murmur is an extra sound that is caused by chaotic blood flow through the valves of the heart. The murmur can be heard as a "hum" or "whoosh" sound when blood flows through the heart. There are two types of heart murmurs:  Innocent (benign) murmurs. Most people with this type of heart murmur do not have a heart problem. Many children have innocent heart murmurs. Your health care provider may suggest some basic tests to find out whether your murmur is an innocent murmur. If an innocent heart murmur is found, there is no need for further tests or treatment and no need to restrict activities or stop playing sports.  Abnormal murmurs. These types of murmurs can occur in children and adults. Abnormal murmurs may be a sign of a more serious heart condition, such as a heart defect present at birth (congenital defect) or heart valve disease. What are the causes? The heart has four areas called chambers. Valves separate the upper and lower chambers from each other (tricuspid valve and mitral valve) and separate the lower chambers of the heart from pathways that lead away from the heart (aortic valve and pulmonary valve). Normally, the valves open to let blood flow through or out of your heart, and then they shut to keep the blood from flowing backward. This condition is caused by heart valves that are not working properly.  In children, abnormal heart murmurs are  typically caused by congenital defects.  In adults, abnormal murmurs are usually caused by heart valve problems from disease, infection, or aging. This condition may also be caused by:  Pregnancy.  Fever.  Overactive thyroid gland.  Anemia.  Exercise.  Rapid growth spurts (in children).   What are the signs or symptoms? Innocent murmurs do not cause symptoms, and many people with abnormal murmurs may not have symptoms. If symptoms do develop, they may include:  Shortness of breath.  Blue coloring of the skin, especially on the fingertips.  Chest pain.  Palpitations, or feeling a fluttering or skipped heartbeat.  Fainting.  Persistent cough.  Getting tired much faster than expected.  Swelling in the abdomen, feet, or ankles. How is this diagnosed? This condition may be diagnosed during a routine physical or other exam. If your health care provider hears a murmur with a stethoscope, he or she will listen for:  Where the murmur is located in your heart.  How long the murmur lasts (duration).  When the murmur is heard during the heartbeat.  How loud the murmur is. This may help the health care provider figure out what is causing the murmur. You may be referred to a heart specialist (cardiologist). You may also have other tests, including:  Electrocardiogram (ECG or EKG). This test measures the electrical activity of your heart.  Echocardiogram. This test uses high frequency sound waves to make pictures of your heart.  MRI or chest X-ray.  Cardiac catheterization. This test looks at blood flow through the arteries around the heart. For children and adults who have an abnormal heart murmur and want to stay active, it is important to:  Complete testing.  Review test results.  Receive recommendations from your health care provider. If heart disease is present, it may not be safe to play or be active. How is this treated? Heart murmurs themselves do not need  treatment. In some cases, a heart murmur may go away on its own. If an underlying problem or disease is causing the murmur, you may need treatment. If treatment is needed, it will depend on the type and severity of the disease or heart problem causing the murmur. Treatment may include:  Medicine.  Surgery.  Dietary and lifestyle changes. Follow these instructions at home:  Talk with your health care provider before participating in sports or other activities that require a lot of effort and energy (are strenuous).  Learn as much as possible about your condition and any related diseases. Ask your health care provider if you may be at risk for any medical emergencies.  Talk with your health care provider about what symptoms you should look out for.  It is up to you to get your test results. Ask your health care provider, or the department that is doing the test, when your results will be ready.  Keep all follow-up visits as told by your health care provider. This is important. Contact a health care provider if:  You are frequently short of breath.  You feel more tired than usual.  You are having a hard time keeping up with normal activities or fitness routines.  You have swelling in your ankles or feet.  You notice that your heart often beats irregularly.  You develop any new symptoms. Get help right away if:  You have chest pain.  You are having trouble breathing.  You feel light-headed or you pass out.  Your symptoms suddenly get worse. These symptoms may represent a serious problem that is an emergency. Do not wait to see if the symptoms will go away. Get medical help right away. Call your local emergency services (911 in the [place] Do not drive yourself to the hospital. Summary  Normally, the heart valves open to let blood flow through or out of your heart, and then they shut to keep the blood from flowing backward.  A heart murmur is caused by heart valves that are not  working properly.  You may need treatment if an underlying problem or disease is causing the heart murmur. Treatment may include medicine, surgery, or dietary and lifestyle changes.  Talk with your health care provider before participating in sports or other activities that require a lot of effort and energy (are strenuous).  Talk with your health care provider about what symptoms you should watch out for. This information is not intended to replace advice given to you by your health care provider. Make sure you discuss any questions you have with your health care provider. Document Revised: 10/07/2017 Document Reviewed: 10/07/2017 Elsevier Patient Education  2021 [place]. Sinusitis, Adult Sinusitis is soreness and swelling (inflammation) of your sinuses. Sinuses are hollow spaces in the bones around your face. They are located:  Around your eyes.  In the middle of your forehead.  Behind your nose.  In your cheekbones. Your sinuses and nasal passages are lined with a fluid called mucus. Mucus drains out of your sinuses. Swelling can trap mucus in your sinuses. This lets germs (bacteria, virus, or fungus) grow, which leads to infection. Most of the time, this condition is caused by a virus. What are the causes? This condition is caused by:  Allergies.  Asthma.  Germs.  Things that block your nose or sinuses.  Growths in the nose (nasal polyps).  Chemicals or irritants in the air.  Fungus (rare). What increases the risk? You are more likely to develop this condition if:  You have a weak body defense system (immune system).  You do a lot of swimming or diving.  You use nasal sprays too much.  You smoke. What are the signs or symptoms? The main symptoms of this condition are pain and a feeling of pressure around the sinuses. Other symptoms include:  Stuffy nose (congestion).  Runny nose (drainage).  Swelling and warmth in the  sinuses.  Headache.  Toothache.  A cough that may get worse at night.  Mucus that collects in the throat or the back of the nose (postnasal drip).  Being unable to smell and taste.  Being very tired (fatigue).  A fever.  Sore throat.  Bad breath. How is this diagnosed? This condition is diagnosed based on:  Your symptoms.  Your medical history.  A physical exam.  Tests to find out if your condition is short-term (acute) or long-term (chronic). Your doctor may: ? Check your nose for growths (polyps). ? Check your sinuses using a tool that has a light (endoscope). ? Check for allergies or germs. ? Do imaging tests, such as an MRI or CT scan. How is this treated? Treatment for this condition depends on the cause and whether it is short-term or long-term.  If caused by a virus, your symptoms should go away on their own within 10 days. You may be given medicines to relieve symptoms. They include: ? Medicines that shrink swollen tissue in the nose. ? Medicines that treat allergies (antihistamines). ? A spray that treats swelling of the nostrils. ? Rinses that help get rid of thick mucus in your nose (nasal saline washes).  If caused by bacteria, your doctor may wait to see if you will get better without treatment. You may be given antibiotic medicine if you have: ? A very bad infection. ? A weak body defense system.  If caused by growths in the nose, you may need to have surgery. Follow these instructions at home: Medicines  Take, use, or apply over-the-counter and prescription medicines only as told by your  doctor. These may include nasal sprays.  If you were prescribed an antibiotic medicine, take it as told by your doctor. Do not stop taking the antibiotic even if you start to feel better. Hydrate and humidify  Drink enough water to keep your pee (urine) pale yellow.  Use a cool mist humidifier to keep the humidity level in your home above 50%.  Breathe in  steam for 10-15 minutes, 3-4 times a day, or as told by your doctor. You can do this in the bathroom while a hot shower is running.  Try not to spend time in cool or dry air.   Rest  Rest as much as you can.  Sleep with your head raised (elevated).  Make sure you get enough sleep each night. General instructions  Put a warm, moist washcloth on your face 3-4 times a day, or as often as told by your doctor. This will help with discomfort.  Wash your hands often with soap and water. If there is no soap and water, use hand sanitizer.  Do not smoke. Avoid being around people who are smoking (secondhand smoke).  Keep all follow-up visits as told by your doctor. This is important.   Contact a doctor if:  You have a fever.  Your symptoms get worse.  Your symptoms do not get better within 10 days. Get help right away if:  You have a very bad headache.  You cannot stop throwing up (vomiting).  You have very bad pain or swelling around your face or eyes.  You have trouble seeing.  You feel confused.  Your neck is stiff.  You have trouble breathing. Summary  Sinusitis is swelling of your sinuses. Sinuses are hollow spaces in the bones around your face.  This condition is caused by tissues in your nose that become inflamed or swollen. This traps germs. These can lead to infection.  If you were prescribed an antibiotic medicine, take it as told by your doctor. Do not stop taking it even if you start to feel better.  Keep all follow-up visits as told by your doctor. This is important. This information is not intended to replace advice given to you by your health care provider. Make sure you discuss any questions you have with your health care provider. Document Revised: 09/15/2017 Document Reviewed: 09/15/2017 Elsevier Patient Education  2021 [place].  Echocardiogram An echocardiogram is a test that uses sound waves (ultrasound) to produce images of the heart. Images  from an echocardiogram can provide important information about:  Heart size and shape.  The size and thickness and movement of your heart's walls.  Heart muscle function and strength.  Heart valve function or if you have stenosis. Stenosis is when the heart valves are too narrow.  If blood is flowing backward through the heart valves (regurgitation).  A tumor or infectious growth around the heart valves.  Areas of heart muscle that are not working well because of poor blood flow or injury from a heart attack.  Aneurysm detection. An aneurysm is a weak or damaged part of an artery wall. The wall bulges out from the normal force of blood pumping through the body. Tell a health care provider about:  Any allergies you have.  All medicines you are taking, including vitamins, herbs, eye drops, creams, and over-the-counter medicines.  Any blood disorders you have.  Any surgeries you have had.  Any medical conditions you have.  Whether you are pregnant or may be pregnant. What are the  risks? Generally, this is a safe test. However, problems may occur, including an allergic reaction to dye (contrast) that may be used during the test. What happens before the test? No specific preparation is needed. You may eat and drink normally. What happens during the test?  You will take off your clothes from the waist up and put on a hospital gown.  Electrodes or electrocardiogram (ECG)patches may be placed on your chest. The electrodes or patches are then connected to a device that monitors your heart rate and rhythm.  You will lie down on a table for an ultrasound exam. A gel will be applied to your chest to help sound waves pass through your skin.  A handheld device, called a transducer, will be pressed against your chest and moved over your heart. The transducer produces sound waves that travel to your heart and bounce back (or "echo" back) to the transducer. These sound waves will be captured  in real-time and changed into images of your heart that can be viewed on a video monitor. The images will be recorded on a computer and reviewed by your health care provider.  You may be asked to change positions or hold your breath for a short time. This makes it easier to get different views or better views of your heart.  In some cases, you may receive contrast through an IV in one of your veins. This can improve the quality of the pictures from your heart. The procedure may vary among health care providers and hospitals.   What can I expect after the test? You may return to your normal, everyday life, including diet, activities, and medicines, unless your health care provider tells you not to do that. Follow these instructions at home:  It is up to you to get the results of your test. Ask your health care provider, or the department that is doing the test, when your results will be ready.  Keep all follow-up visits. This is important. Summary  An echocardiogram is a test that uses sound waves (ultrasound) to produce images of the heart.  Images from an echocardiogram can provide important information about the size and shape of your heart, heart muscle function, heart valve function, and other possible heart problems.  You do not need to do anything to prepare before this test. You may eat and drink normally.  After the echocardiogram is completed, you may return to your normal, everyday life, unless your health care provider tells you not to do that. This information is not intended to replace advice given to you by your health care provider. Make sure you discuss any questions you have with your health care provider. Document Revised: 12/07/2019 Document Reviewed: 12/07/2019 Elsevier Patient Education  2021 [place].  Otitis Media, Adult  Otitis media is a condition in which the middle ear is red and swollen (inflamed) and full of fluid. The middle ear is the part of the ear  that contains bones for hearing as well as air that helps send sounds to the brain. The condition usually goes away on its own. What are the causes? This condition is caused by a blockage in the eustachian tube. The eustachian tube connects the middle ear to the back of the nose. It normally allows air into the middle ear. The blockage is caused by fluid or swelling. Problems that can cause blockage include:  A cold or infection that affects the nose, mouth, or throat.  Allergies.  An irritant, such as tobacco  smoke.  Adenoids that have become large. The adenoids are soft tissue located in the back of the throat, behind the nose and the roof of the mouth.  Growth or swelling in the upper part of the throat, just behind the nose (nasopharynx).  Damage to the ear caused by change in pressure. This is called barotrauma. What are the signs or symptoms? Symptoms of this condition include:  Ear pain.  Fever.  Problems with hearing.  Being tired.  Fluid leaking from the ear.  Ringing in the ear. How is this treated? This condition can go away on its own within 3-5 days. But if the condition is caused by bacteria or does not go away on its own, or if it keeps coming back, your doctor may:  Give you antibiotic medicines.  Give you medicines for pain. Follow these instructions at home:  Take over-the-counter and prescription medicines only as told by your doctor.  If you were prescribed an antibiotic medicine, take it as told by your doctor. Do not stop taking the antibiotic even if you start to feel better.  Keep all follow-up visits as told by your doctor. This is important. Contact a doctor if:  You have bleeding from your nose.  There is a lump on your neck.  You are not feeling better in 5 days.  You feel worse instead of better. Get help right away if:  You have pain that is not helped with medicine.  You have swelling, redness, or pain around your ear.  You get a  stiff neck.  You cannot move part of your face (paralysis).  You notice that the bone behind your ear hurts when you touch it.  You get a very bad headache. Summary  Otitis media means that the middle ear is red, swollen, and full of fluid.  This condition usually goes away on its own.  If the problem does not go away, treatment may be needed. You may be given medicines to treat the infection or to treat your pain.  If you were prescribed an antibiotic medicine, take it as told by your doctor. Do not stop taking the antibiotic even if you start to feel better.  Keep all follow-up visits as told by your doctor. This is important. This information is not intended to replace advice given to you by your health care provider. Make sure you discuss any questions you have with your health care provider. Document Revised: 03/18/2019 Document Reviewed: 03/18/2019 Elsevier Patient Education  2021 [place]. Sulfamethoxazole; Trimethoprim, SMX-TMP tablets What is this medicine? SULFAMETHOXAZOLE; TRIMETHOPRIM or SMX-TMP (suhl fuh meth OK suh zohl; trye METH oh prim) is a combination of a sulfonamide antibiotic and a second antibiotic, trimethoprim. It is used to treat or prevent certain kinds of bacterial infections. It will not work for colds, flu, or other viral infections. This medicine may be used for other purposes; ask your health care provider or pharmacist if you have questions. COMMON BRAND NAME(S): Bacter-Aid DS, Bactrim, Bactrim DS, Septra, Septra DS What should I tell my health care provider before I take this medicine? They need to know if you have any of these conditions:  G6PD deficiency  HIV or AIDS  kidney disease  liver disease  low platelets  low red blood cell counts  poor nutrition  stomach or intestine problems like colitis  thyroid disease  an unusual or allergic reaction to sulfamethoxazole, trimethoprim, sulfa drugs, other drugs, foods, dyes, or  preservatives  pregnant or trying  to get pregnant  breast-feeding How should I use this medicine? Take this medicine by mouth with a glass of water. Follow the directions on the prescription label. Take your medicine at regular intervals. Do not take it more often than directed. Take all of your medicine as directed even if you think you are better. Do not skip doses or stop your medicine early. Talk to your pediatrician regarding the use of this medicine in children. While this drug may be prescribed for children as young as 2 months for selected conditions, precautions do apply. Overdosage: If you think you have taken too much of this medicine contact a poison control center or emergency room at once. NOTE: This medicine is only for you. Do not share this medicine with others. What if I miss a dose? If you miss a dose, take it as soon as you can. If it is almost time for your next dose, take only that dose. Do not take double or extra doses. What may interact with this medicine? Do not take this medicine with any of the following medications:  dofetilide This medicine may also interact with the following medications:  amantadine  birth control pills  certain medicines for blood pressure, heart disease  certain medicines for depression, like amitriptyline  certain medicines for diabetes, like glipizide or glyburide  certain medicines that treat or prevent blood clots like warfarin  cyclosporine  digoxin  diuretics  indomethacin  methotrexate  phenytoin  procainamide  pyrimethamine  zidovudine This list may not describe all possible interactions. Give your health care provider a list of all the medicines, herbs, non-prescription drugs, or dietary supplements you use. Also tell them if you smoke, drink alcohol, or use illegal drugs. Some items may interact with your medicine. What should I watch for while using this medicine? Tell your health care provider if your  symptoms do not start to get better or if they get worse. Do not treat diarrhea with over the counter products. Contact your health care provider if you have diarrhea that lasts more than 2 days or if it is severe and watery. This drug may cause serious skin reactions. They can happen weeks to months after starting the drug. Contact your health care provider right away if you notice fevers or flu-like symptoms with a rash. The rash may be red or purple and then turn into blisters or peeling of the skin. Or, you might notice a red rash with swelling of the face, lips or lymph nodes in your neck or under your arms. This drug can make you more sensitive to the sun. Keep out of the sun. If you cannot avoid being in the sun, wear protective clothing and sunscreen. Do not use sun lamps or tanning beds/booths. Be careful brushing or flossing your teeth or using a toothpick because you may get an infection or bleed more easily. If you have any dental work done, tell your dentist you are receiving this drug. What side effects may I notice from receiving this medicine? Side effects that you should report to your doctor or health care professional as soon as possible:  allergic reactions (skin rash, itching or hives; swelling of the face, lips, or tongue)  bloody or watery diarrhea  cough  heartbeat rhythm changes (trouble breathing; chest pain; dizziness; fast, irregular heartbeat; feeling faint or lightheaded, falls,)  fever  high potassium levels (chest pain; fast, irregular heartbeat; muscle weakness)  kidney injury (trouble passing urine or change in the amount of  urine)  liver injury (dark yellow or brown urine; general ill feeling or flu-like symptoms; loss of appetite, right upper belly pain; unusually weak or tired, yellowing of the eyes or skin)  low blood pressure (dizziness; feeling faint or lightheaded, falls; unusually weak or tired)  low blood sugar (feeling anxious; confusion;  dizziness; increased hunger; unusually weak or tired; increased sweating; shakiness; cold, clammy skin; irritable; headache; blurred vision; fast heartbeat; loss of consciousness)  low red blood cell counts (trouble breathing; feeling faint; lightheaded, falls; unusually weak or tired)  rash, fever, and swollen lymph nodes  redness, blistering, peeling, or loosening of the skin, including inside the mouth  trouble breathing  unusual bruising or bleeding Side effects that usually do not require medical attention (report to your doctor or health care professional if they continue or are bothersome):  lack or loss of appetite  nausea  vomiting This list may not describe all possible side effects. Call your doctor for medical advice about side effects. You may report side effects to FDA at 1-800-FDA-1088. Where should I keep my medicine? Keep out of the reach of children. Store between 15 and 25 degrees C (59 to 77 degrees F). Protect from light. Keep the container tightly closed. Throw away any unused drug after the expiration date. NOTE: This sheet is a summary. It may not cover all possible information. If you have questions about this medicine, talk to your doctor, pharmacist, or health care provider.  2021 Elsevier/Gold Standard (2019-03-19 09:54:22)

## 2020-06-23 DIAGNOSIS — D649 Anemia, unspecified: Secondary | ICD-10-CM | POA: Insufficient documentation

## 2020-06-23 DIAGNOSIS — G4730 Sleep apnea, unspecified: Secondary | ICD-10-CM | POA: Insufficient documentation

## 2020-06-23 DIAGNOSIS — K219 Gastro-esophageal reflux disease without esophagitis: Secondary | ICD-10-CM | POA: Insufficient documentation

## 2020-06-23 DIAGNOSIS — T7840XA Allergy, unspecified, initial encounter: Secondary | ICD-10-CM | POA: Insufficient documentation

## 2020-06-23 DIAGNOSIS — I10 Essential (primary) hypertension: Secondary | ICD-10-CM | POA: Insufficient documentation

## 2020-06-23 DIAGNOSIS — M1990 Unspecified osteoarthritis, unspecified site: Secondary | ICD-10-CM | POA: Insufficient documentation

## 2020-06-23 DIAGNOSIS — E119 Type 2 diabetes mellitus without complications: Secondary | ICD-10-CM | POA: Insufficient documentation

## 2020-06-23 DIAGNOSIS — I350 Nonrheumatic aortic (valve) stenosis: Secondary | ICD-10-CM | POA: Insufficient documentation

## 2020-06-23 DIAGNOSIS — M7582 Other shoulder lesions, left shoulder: Secondary | ICD-10-CM | POA: Insufficient documentation

## 2020-06-23 DIAGNOSIS — E785 Hyperlipidemia, unspecified: Secondary | ICD-10-CM | POA: Insufficient documentation

## 2020-06-23 DIAGNOSIS — H269 Unspecified cataract: Secondary | ICD-10-CM | POA: Insufficient documentation

## 2020-06-23 DIAGNOSIS — M722 Plantar fascial fibromatosis: Secondary | ICD-10-CM | POA: Insufficient documentation

## 2020-06-23 DIAGNOSIS — R011 Cardiac murmur, unspecified: Secondary | ICD-10-CM | POA: Insufficient documentation

## 2020-06-27 NOTE — Progress Notes (Signed)
Chronic Care Management Pharmacy Note  06/28/2020 Name:  Megan Galvan MRN:  [id number] DOB:  1950-11-25   Plan updates:   Patient is scheduled to see Dr. Bettina Gavia on Friday for chest pain episodes.   Blood sugar seems stable without medication. Patient is improving diet and working to increase activity.   Subjective: Megan Galvan is an 70 y.o. year old female who is a primary patient of Cox, Kirsten, MD.  The CCM team was consulted for assistance with disease management and care coordination needs.    Engaged with patient by telephone for follow up visit in response to provider referral for pharmacy case management and/or care coordination services.   Consent to Services:  The patient was given information about Chronic Care Management services, agreed to services, and gave verbal consent prior to initiation of services.  Please see initial visit note for detailed documentation.   Patient Care Team: Rochel Brome, MD as PCP - General (Family Medicine) Burnice Logan, [facility] as Pharmacist (Pharmacist)  Recent office visits: 06/22/2020 - referral to cardiology. Keep echocardiogram appointment. Bactrim DS for sinusitis. Continue flonase and Zyrtec.  06/01/2020 - azithromycin for sinusitis.  05/09/2020 - low fat and low sugar diet encouraged.   Recent consult visits:   Hospital visits: 06/19/2020 - ED visit for chest pain. Abnormal EKG changes.   Objective:  Lab Results  Component Value Date   CREATININE 0.83 05/09/2020   BUN 16 05/09/2020   GFRNONAA 73 05/09/2020   GFRAA 84 05/09/2020   NA 144 05/09/2020   K 4.4 05/09/2020   CALCIUM 9.4 05/09/2020   CO2 30 (H) 05/09/2020    Lab Results  Component Value Date/Time   HGBA1C 6.8 (H) 05/09/2020 11:32 AM   HGBA1C 6.9 (H) 01/28/2020 11:23 AM   MICROALBUR 10 05/09/2020 11:32 AM   MICROALBUR 30 10/01/2019 10:27 AM    Last diabetic Eye exam:  Lab Results  Component Value Date/Time   HMDIABEYEEXA No Retinopathy 03/20/2020  04:25 PM    Last diabetic Foot exam: No results found for: HMDIABFOOTEX   Lab Results  Component Value Date   CHOL 116 05/09/2020   HDL 45 05/09/2020   LDLCALC 51 05/09/2020   TRIG 107 05/09/2020   CHOLHDL 2.6 05/09/2020    Hepatic Function Latest Ref Rng & Units 05/09/2020 01/28/2020 10/01/2019  Total Protein 6.0 - 8.5 g/dL 6.6 7.1 7.1  Albumin 3.8 - 4.8 g/dL 4.3 4.4 4.3  AST 0 - 40 IU/L 37 38 31  ALT 0 - 32 IU/L _0 Alk Phosphatase 44 - 121 IU/L 65 59 62  Total Bilirubin 0.0 - 1.2 mg/dL 0.4 0.5 0.4    No results found for: TSH, FREET4  CBC Latest Ref Rng & Units 05/09/2020 01/28/2020 10/01/2019  WBC 3.4 - 10.8 x10E3/uL 7.2 6.3 6.7  Hemoglobin 11.1 - 15.9 g/dL 11.7 11.9 11.7  Hematocrit 34.0 - 46.6 % 34.7 35.9 36.2  Platelets 150 - 450 x10E3/uL 203 211 226    No results found for: VD25OH  Clinical ASCVD: No  The ASCVD Risk score Mikey Bussing DC Jr., et al., 2013) failed to calculate for the following reasons:   The valid total cholesterol range is 130 to 320 mg/dL    Depression screen [facility] 2/9 05/10/2020 03/27/2020 03/14/2020  Decreased Interest 0 1 0  Down, Depressed, Hopeless 0 1 0  PHQ - 2 Score 0 2 0  Altered sleeping 0 0 -  Tired, decreased energy 1 0 -  Change in appetite 0 0 -  Feeling bad or failure about yourself  0 0 -  Trouble concentrating 0 0 -  Moving slowly or fidgety/restless 0 0 -  Suicidal thoughts 0 0 -  PHQ-9 Score 1 2 -  Difficult doing work/chores Not difficult at all - -    Social History   Tobacco Use  Smoking Status Never Smoker  Smokeless Tobacco Never Used   BP Readings from Last 3 Encounters:  06/22/20 140/68  06/01/20 (!) 140/58  05/09/20 122/72   Pulse Readings from Last 3 Encounters:  06/22/20 62  06/01/20 60  05/09/20 66   Wt Readings from Last 3 Encounters:  06/22/20 213 lb (96.6 kg)  06/01/20 216 lb (98 kg)  05/09/20 216 lb (98 kg)    Assessment/Interventions: Review of patient past medical history, allergies,  medications, health status, including review of consultants reports, laboratory and other test data, was performed as part of comprehensive evaluation and provision of chronic care management services.   SDOH:  (Social Determinants of Health) assessments and interventions performed: Yes   CCM Care Plan  Allergies  Allergen Reactions  . Ace Inhibitors Cough  . Doxycycline     Rash   . Keflex [Cephalexin]   . Latex     Burn and itch  . Nexium [Esomeprazole Magnesium] Diarrhea  . Protonix [Pantoprazole Sodium] Other (See Comments)    Constipation    Medications Reviewed Today    Reviewed by Burnice Logan, [facility] (Pharmacist) on 06/28/20 at 1141  Med List Status: <None>  Medication Order Taking? Sig Documenting Provider Last Dose Status Informant  Accu-Chek Softclix Lancets lancets [id number] Yes Use as instructed [name], PA-C Taking Active   acetaminophen (TYLENOL) 500 MG tablet [id number] Yes 500 mg as needed.  [provider] Taking Active   aspirin EC 81 MG tablet [id number] Yes Take 81 mg by mouth daily. [provider] Taking Active   cetirizine (ZYRTEC) 10 MG tablet [id number] Yes Take 1 tablet by mouth daily. [provider] Taking Active   chlorthalidone (HYGROTON) 50 MG tablet [id number] Yes Take 1 tablet (50 mg total) by mouth daily.  Patient taking differently: Take 50 mg by mouth in the morning and at bedtime.   Cox, Kirsten, MD Taking Active   conjugated estrogens (PREMARIN) vaginal cream [id number] Yes Place 1 Applicatorful vaginally 2 (two) times a week. Cox, Kirsten, MD Taking Active   famotidine (PEPCID) 40 MG tablet [id number] Yes TAKE 1 TABLET BY MOUTH AT BEDTIME Cox, Kirsten, MD Taking Active   fluticasone [facility]) 50 MCG/ACT nasal spray [id number] Yes Place 1 spray into the nose as needed. [provider] Taking Active            Med Note (HARP, Thad Ranger Dec 11, 2017  3:01 PM) 1 spray in each   glucose blood (ACCU-CHEK AVIVA  PLUS) test strip [id number] Yes CHECK BLOOD SUGAR ONCE  DAILY [name], PA-C Taking Active   hydrocortisone (ANUSOL-HC) 25 MG suppository [id number] Yes Place 1 suppository (25 mg total) rectally 2 (two) times daily. Cox, Kirsten, MD Taking Active   metoprolol succinate (TOPROL-XL) 50 MG 24 hr tablet [id number] Yes TAKE ONE-HALF TABLET BY  MOUTH DAILY Cox, Kirsten, MD Taking Active   omeprazole (PRILOSEC) 40 MG capsule [id number] Yes Take 1 capsule (40 mg total) by mouth daily. Cox, Kirsten, MD Taking Active   potassium chloride SA (KLOR-CON) 20 MEQ tablet [id number] Yes 2 in am and 1 at  night. Cox, Kirsten, MD Taking Active   pravastatin (PRAVACHOL) 40 MG tablet [id number] Yes Take 1 tablet (40 mg total) by mouth at bedtime. Cox, Kirsten, MD Taking Active   sulfamethoxazole-trimethoprim (BACTRIM DS) 800-160 MG tablet [id number] No Take 1 tablet by mouth 2 (two) times daily.  Patient not taking: Reported on 06/28/2020   Rip Harbour, NP Not Taking Active   traZODone (DESYREL) 100 MG tablet [id number] Yes TAKE 1 TABLET BY MOUTH  BEFORE BEDTIME Cox, Kirsten, MD Taking Active   valsartan (DIOVAN) 320 MG tablet [id number] Yes Take 1 tablet (320 mg total) by mouth daily. CoxElnita Maxwell, MD Taking Active           Patient Active Problem List   Diagnosis Date Noted  . Sleep apnea   . Plantar fasciitis   . Nonrheumatic aortic (valve) stenosis   . Hypertension   . Hyperlipidemia   . Heart murmur   . GERD (gastroesophageal reflux disease)   . Diabetes mellitus without complication ([place])   . Cataract   . Bone spur of acromioclavicular joint, left   . Arthritis   . Anemia   . Allergy   . Medicare annual wellness visit, subsequent 03/27/2020  . Essential hypertension 10/01/2019  . Mixed hyperlipidemia   . Vertigo 07/20/2019  . Myalgia, other site 06/07/2019  . Class 1 obesity due to excess calories with serious comorbidity and body mass index (BMI) of 34.0 to 34.9 in adult 06/07/2019  . BMI  35.0-35.9,adult 06/07/2019  . Gastroesophageal reflux disease without esophagitis 06/03/2019  . Type 2 diabetes mellitus with diabetic polyneuropathy ([place]) 06/03/2019  . Primary insomnia 06/03/2019    Immunization History  Administered Date(s) Administered  . Hepatitis B 06/22/2013, 11/16/2014, 05/26/2015  . Influenza, High Dose Seasonal PF 12/29/2018  . Influenza-Unspecified 01/24/2020  . Moderna SARS-COV2 Booster Vaccination 02/29/2020  . Moderna Sars-Covid-2 Vaccination 06/07/2019, 07/05/2019  . Pneumococcal Conjugate-13 12/29/2013  . Pneumococcal Polysaccharide-23 04/13/2012  . Td 11/23/2014    Conditions to be addressed/monitored:  Hypertension, Hyperlipidemia, Diabetes and GERD  Care Plan : [place]  Updates made by Burnice Logan, [place] since 06/28/2020 12:00 AM    Problem: dm, htn, hld, gerd   Priority: High  Onset Date: 06/28/2020    Long-Range Goal: disease management   Start Date: 06/28/2020  Expected End Date: 06/28/2021  This Visit's Progress: On track  Priority: High  Note:    Current Barriers:  . Patient working to increase exercise but dealing with shortness of breath and chest pain episodes.    Pharmacist Clinical Goal(s):  Marland Kitchen Over the next 90 days, patient will achieve control of chest pain episodes as evidenced by ability to exercise and avoid chest pain episodes.  through collaboration with PharmD and provider.   Interventions: . 1:1 collaboration with Rochel Brome, MD regarding development and update of comprehensive plan of care as evidenced by provider attestation and co-signature . Inter-disciplinary care team collaboration (see longitudinal plan of care) . Comprehensive medication review performed; medication list updated in electronic medical record  Hypertension (BP goal <130/80) -Controlled -Current treatment: . chlorthalidone 50 mg am and hs . Metoprolol succinate 50 mg daily  . Valsartan 320 mg daily  -Medications previously tried:  ace inhibitors  -Current home readings:  120-130/80 lately - went to ER with high bp lately -Current dietary habits: trying to eat healthier for diabetes control.  -Current exercise habits: working to increase and start working more  -Reports hypotensive/hypertensive symptoms -Educated on BP  goals and benefits of medications for prevention of heart attack, stroke and kidney damage; Daily salt intake goal < 2300 mg; Exercise goal of 150 minutes per week; Importance of home blood pressure monitoring; -Counseled to monitor BP at home daily, document, and provide log at future appointments -Counseled on diet and exercise extensively Recommended to continue current medication Educated on writing down questions and blood pressures to have with her at cardiologist visit this week.   Hyperlipidemia: (LDL goal < 70) -Controlled -Current treatment: . Pravastatin 40 mg daily at bedtime -Medications previously tried: none reported  -Current dietary patterns: healthier diet and working to increase protein in diet -Current exercise habits: working to increase activity.  -Educated on Cholesterol goals;  Benefits of statin for ASCVD risk reduction; Importance of limiting foods high in cholesterol; Exercise goal of 150 minutes per week; -Counseled on diet and exercise extensively Recommended to continue current medication  Diabetes (A1c goal <7%) -Controlled -Current medications: . accu-chek softclix daily . aviva plus test strips daily  -Medications previously tried: metformin  -Current home glucose readings . fasting glucose: ~120-150 -Denies hypoglycemic/hyperglycemic symptoms -Current meal patterns:  . breakfast: working to get protein and control sugar with options - eggs   . Lunch and dinner: loves spaghetti and trying to limit eating it -Current exercise: trying to exercise some -  having some shortness of breath or chest pain being evaluated by cardiologist.  -Educated onA1c and blood  sugar goals; Complications of diabetes including kidney damage, retinal damage, and cardiovascular disease; Exercise goal of 150 minutes per week; Benefits of routine self-monitoring of blood sugar; Carbohydrate counting and/or plate method -Counseled to check feet daily and get yearly eye exams -Counseled on diet and exercise extensively  GERD  (Goal: control acid reflux symptoms ) -Controlled -Current treatment  . famotidine 40 mg daily at bedtime  . Omeprazole 40 mg daily -Medications previously tried: none reported -Recommended to continue current medication Counseled on triggers to flares. Patient is having some chest pain symptoms and being seen by cardiology this week. Doesn't feel that symptoms are related to acid reflux.     Patient Goals/Self-Care Activities . Over the next 90 days, patient will:  - take medications as prescribed focus on medication adherence by using pill box check glucose daily, document, and provide at future appointments check blood pressure daily, document, and provide at future appointments target a minimum of 150 minutes of moderate intensity exercise weekly  Follow Up Plan: Telephone follow up appointment with care management team member scheduled for: 12/2020      Medication Assistance: None required.  Patient affirms current coverage meets needs.  Patient's preferred pharmacy is:  [facility] [place], [place] - [address] AT [address] [place] [address] Phone: [phone] Fax: [phone]  [place], [place] [address] Phone: [phone] Fax: [phone]  [place], [place], [address] [place], [address] [place] [address] Phone: [phone] Fax: [phone]  Uses pill box? Yes Pt endorses excellent compliance  We discussed: Current pharmacy is preferred  with insurance plan and patient is satisfied with pharmacy services Patient decided to: Continue current medication management strategy  Care Plan and Follow Up Patient Decision:  Patient agrees to Care Plan and Follow-up.  Plan: Telephone follow up appointment with care management team member scheduled for:  12/2020

## 2020-06-28 ENCOUNTER — Other Ambulatory Visit: Payer: Self-pay

## 2020-06-28 ENCOUNTER — Ambulatory Visit (INDEPENDENT_AMBULATORY_CARE_PROVIDER_SITE_OTHER): Payer: Medicare Other

## 2020-06-28 DIAGNOSIS — K219 Gastro-esophageal reflux disease without esophagitis: Secondary | ICD-10-CM

## 2020-06-28 DIAGNOSIS — E782 Mixed hyperlipidemia: Secondary | ICD-10-CM | POA: Diagnosis not present

## 2020-06-28 DIAGNOSIS — I10 Essential (primary) hypertension: Secondary | ICD-10-CM | POA: Diagnosis not present

## 2020-06-28 DIAGNOSIS — E1142 Type 2 diabetes mellitus with diabetic polyneuropathy: Secondary | ICD-10-CM

## 2020-06-28 NOTE — Patient Instructions (Addendum)
Visit Information  Goals Addressed            This Visit's Progress   . Learn More About My Health       Timeframe:  Long-Range Goal Priority:  High Start Date:       06/28/2020                      Expected End Date: 06/28/2021               Follow Up Date 12/29/2020    - tell my story and reason for my visit - repeat what I heard to make sure I understand - bring a list of my medicines to the visit - speak up when I don't understand    Why is this important?    The best way to learn about your health and care is by talking to the doctor and nurse.   They will answer your questions and give you information in the way that you like best.    Notes:     Marland Kitchen Manage My Medicine       Timeframe:  Long-Range Goal Priority:  High Start Date:     06/28/20                       Expected End Date:         06/28/2021              Follow Up Date 12/29/2020    - call for medicine refill 2 or 3 days before it runs out - keep a list of all the medicines I take; vitamins and herbals too - use a pillbox to sort medicine    Why is this important?   . These steps will help you keep on track with your medicines.   Notes:     Marland Kitchen Monitor and Manage My Blood Sugar-Diabetes Type 2       Timeframe:  Long-Range Goal Priority:  High Start Date:          06/28/2020                   Expected End Date:     06/28/2021                  Follow Up Date 12/29/2020    - check blood sugar if I feel it is too high or too low - take the blood sugar log to all doctor visits    Why is this important?    Checking your blood sugar at home helps to keep it from getting very high or very low.   Writing the results in a diary or log helps the doctor know how to care for you.   Your blood sugar log should have the time, date and the results.   Also, write down the amount of insulin or other medicine that you take.   Other information, like what you ate, exercise done and how you were feeling,  will also be helpful.     Notes:       Patient Care Plan: CCM Pharmacy Care Plan    Problem Identified: dm, htn, hld, gerd   Priority: High  Onset Date: 06/28/2020    Long-Range Goal: disease management   Start Date: 06/28/2020  Expected End Date: 06/28/2021  This Visit's Progress: On track  Priority: High  Note:    Current Barriers:  . Patient working  to increase exercise but dealing with shortness of breath and chest pain episodes.    Pharmacist Clinical Goal(s):  Marland Kitchen Over the next 90 days, patient will achieve control of chest pain episodes as evidenced by ability to exercise and avoid chest pain episodes.  through collaboration with PharmD and provider.   Interventions: . 1:1 collaboration with Rochel Brome, MD regarding development and update of comprehensive plan of care as evidenced by provider attestation and co-signature . Inter-disciplinary care team collaboration (see longitudinal plan of care) . Comprehensive medication review performed; medication list updated in electronic medical record  Hypertension (BP goal <130/80) -Controlled -Current treatment: . chlorthalidone 50 mg am and hs . Metoprolol succinate 50 mg daily  . Valsartan 320 mg daily  -Medications previously tried: ace inhibitors  -Current home readings:  120-130/80 lately - went to ER with high bp lately -Current dietary habits: trying to eat healthier for diabetes control.  -Current exercise habits: working to increase and start working more  -Reports hypotensive/hypertensive symptoms -Educated on BP goals and benefits of medications for prevention of heart attack, stroke and kidney damage; Daily salt intake goal < 2300 mg; Exercise goal of 150 minutes per week; Importance of home blood pressure monitoring; -Counseled to monitor BP at home daily, document, and provide log at future appointments -Counseled on diet and exercise extensively Recommended to continue current medication Educated on writing  down questions and blood pressures to have with her at cardiologist visit this week.   Hyperlipidemia: (LDL goal < 70) -Controlled -Current treatment: . Pravastatin 40 mg daily at bedtime -Medications previously tried: none reported  -Current dietary patterns: healthier diet and working to increase protein in diet -Current exercise habits: working to increase activity.  -Educated on Cholesterol goals;  Benefits of statin for ASCVD risk reduction; Importance of limiting foods high in cholesterol; Exercise goal of 150 minutes per week; -Counseled on diet and exercise extensively Recommended to continue current medication  Diabetes (A1c goal <7%) -Controlled -Current medications: . accu-chek softclix daily . aviva plus test strips daily  -Medications previously tried: metformin  -Current home glucose readings . fasting glucose: ~120-150 -Denies hypoglycemic/hyperglycemic symptoms -Current meal patterns:  . breakfast: working to get protein and control sugar with options - eggs   . Lunch and dinner: loves spaghetti and trying to limit eating it -Current exercise: trying to exercise some -  having some shortness of breath or chest pain being evaluated by cardiologist.  -Educated onA1c and blood sugar goals; Complications of diabetes including kidney damage, retinal damage, and cardiovascular disease; Exercise goal of 150 minutes per week; Benefits of routine self-monitoring of blood sugar; Carbohydrate counting and/or plate method -Counseled to check feet daily and get yearly eye exams -Counseled on diet and exercise extensively  GERD  (Goal: control acid reflux symptoms ) -Controlled -Current treatment  . famotidine 40 mg daily at bedtime  . Omeprazole 40 mg daily -Medications previously tried: none reported -Recommended to continue current medication Counseled on triggers to flares. Patient is having some chest pain symptoms and being seen by cardiology this week. Doesn't  feel that symptoms are related to acid reflux.     Patient Goals/Self-Care Activities . Over the next 90 days, patient will:  - take medications as prescribed focus on medication adherence by using pill box check glucose daily, document, and provide at future appointments check blood pressure daily, document, and provide at future appointments target a minimum of 150 minutes of moderate intensity exercise weekly  Follow Up Plan:  Telephone follow up appointment with care management team member scheduled for: 12/2020      The patient verbalized understanding of instructions, educational materials, and care plan provided today and declined offer to receive copy of patient instructions, educational materials, and care plan.  Telephone follow up appointment with pharmacy team member scheduled for: 12/2020  Burnice Logan, [facility]  Blood Pressure Record Sheet To take your blood pressure, you will need a blood pressure machine. You can buy a blood pressure machine (blood pressure monitor) at your clinic, drug store, or online. When choosing one, consider:  An automatic monitor that has an arm cuff.  A cuff that wraps snugly around your upper arm. You should be able to fit only one finger between your arm and the cuff.  A device that stores blood pressure reading results.  Do not choose a monitor that measures your blood pressure from your wrist or finger. Follow your health care provider's instructions for how to take your blood pressure. To use this form:  Get one reading in the morning (a.m.) before you take any medicines.  Get one reading in the evening (p.m.) before supper.  Take at least 2 readings with each blood pressure check. This makes sure the results are correct. Wait 1-2 minutes between measurements.  Write down the results in the spaces on this form.  Repeat this once a week, or as told by your health care provider.  Make a follow-up appointment with your health care  provider to discuss the results. Blood pressure log Date: _______________________  a.m. _____________________(1st reading) _____________________(2nd reading)  p.m. _____________________(1st reading) _____________________(2nd reading) Date: _______________________  a.m. _____________________(1st reading) _____________________(2nd reading)  p.m. _____________________(1st reading) _____________________(2nd reading) Date: _______________________  a.m. _____________________(1st reading) _____________________(2nd reading)  p.m. _____________________(1st reading) _____________________(2nd reading) Date: _______________________  a.m. _____________________(1st reading) _____________________(2nd reading)  p.m. _____________________(1st reading) _____________________(2nd reading) Date: _______________________  a.m. _____________________(1st reading) _____________________(2nd reading)  p.m. _____________________(1st reading) _____________________(2nd reading) This information is not intended to replace advice given to you by your health care provider. Make sure you discuss any questions you have with your health care provider. Document Revised: 08/04/2019 Document Reviewed: 08/04/2019 Elsevier Patient Education  2021 [employer].

## 2020-06-29 ENCOUNTER — Ambulatory Visit: Payer: Medicare Other | Admitting: Cardiology

## 2020-06-30 ENCOUNTER — Ambulatory Visit: Payer: Medicare Other | Admitting: Cardiology

## 2020-07-18 ENCOUNTER — Other Ambulatory Visit: Payer: Self-pay

## 2020-07-18 ENCOUNTER — Ambulatory Visit (INDEPENDENT_AMBULATORY_CARE_PROVIDER_SITE_OTHER): Payer: Medicare Other | Admitting: Family Medicine

## 2020-07-18 VITALS — BP 150/76 | HR 60 | Temp 97.2°F | Resp 16 | Ht 66.0 in | Wt 215.0 lb

## 2020-07-18 DIAGNOSIS — R0789 Other chest pain: Secondary | ICD-10-CM

## 2020-07-18 DIAGNOSIS — I352 Nonrheumatic aortic (valve) stenosis with insufficiency: Secondary | ICD-10-CM | POA: Diagnosis not present

## 2020-07-18 DIAGNOSIS — I340 Nonrheumatic mitral (valve) insufficiency: Secondary | ICD-10-CM | POA: Diagnosis not present

## 2020-07-18 DIAGNOSIS — K6289 Other specified diseases of anus and rectum: Secondary | ICD-10-CM | POA: Diagnosis not present

## 2020-07-18 DIAGNOSIS — I517 Cardiomegaly: Secondary | ICD-10-CM | POA: Diagnosis not present

## 2020-07-18 DIAGNOSIS — R079 Chest pain, unspecified: Secondary | ICD-10-CM | POA: Diagnosis not present

## 2020-07-18 NOTE — Progress Notes (Signed)
Subjective:  Patient ID: Megan Galvan, female    DOB: 15-Feb-1951  Age: 70 y.o. MRN: [id number]  Chief Complaint  Patient presents with  . Rectal Pain    HPI Continued rectal pain. This has been going on for a couple of years intermittently. She underwent rectum surgery with Dr. Amalia Hailey. Per the patient he did not feel she could undergo any further rectal surgery due to the amount of surgery he had to do.   Pt was seen on 06/19/2020 at [facility]. Has had recurrent chest pain under her left axilla and radiated up her left neck. Lasted 5 minutes. SOB. No nausea, vomiting. No diaphoresis. Occurred with activity. Similar to pain that took her to the ED on 06/19/2020.    Current Outpatient Medications on File Prior to Visit  Medication Sig Dispense Refill  . Accu-Chek Softclix Lancets lancets Use as instructed 100 each 12  . acetaminophen (TYLENOL) 500 MG tablet Take 1,000 mg by mouth every 6 (six) hours as needed for moderate pain or headache.    Marland Kitchen aspirin EC 81 MG tablet Take 81 mg by mouth daily.    . cetirizine (ZYRTEC) 10 MG tablet Take 10 mg by mouth daily.    . chlorthalidone (HYGROTON) 50 MG tablet Take 1 tablet (50 mg total) by mouth daily. 90 tablet 1  . conjugated estrogens (PREMARIN) vaginal cream Place 1 Applicatorful vaginally 2 (two) times a week. 42.5 g 2  . famotidine (PEPCID) 40 MG tablet TAKE 1 TABLET BY MOUTH AT BEDTIME (Patient taking differently: Take 40 mg by mouth at bedtime.) 90 tablet 3  . fluticasone (FLONASE) 50 MCG/ACT nasal spray Place 1 spray into the nose daily as needed for allergies.    Marland Kitchen glucose blood (ACCU-CHEK AVIVA PLUS) test strip CHECK BLOOD SUGAR ONCE  DAILY 100 strip 3  . hydrocortisone (ANUSOL-HC) 25 MG suppository Place 1 suppository (25 mg total) rectally 2 (two) times daily. (Patient taking differently: Place 25 mg rectally 2 (two) times daily as needed for hemorrhoids.) 14 suppository 2  . metoprolol succinate (TOPROL-XL) 50 MG 24 hr  tablet TAKE ONE-HALF TABLET BY  MOUTH DAILY (Patient taking differently: Take 25 mg by mouth daily.) 45 tablet 3  . omeprazole (PRILOSEC) 40 MG capsule Take 1 capsule (40 mg total) by mouth daily. 90 capsule 1  . potassium chloride SA (KLOR-CON) 20 MEQ tablet 2 in am and 1 at night. (Patient taking differently: Take 20-40 mEq by mouth See admin instructions. Take 40 meq in the morning and 20 meq at night) 270 tablet 1  . pravastatin (PRAVACHOL) 40 MG tablet Take 1 tablet (40 mg total) by mouth at bedtime. 90 tablet 1  . traZODone (DESYREL) 100 MG tablet TAKE 1 TABLET BY MOUTH  BEFORE BEDTIME (Patient taking differently: Take 100 mg by mouth at bedtime.) 90 tablet 1  . valsartan (DIOVAN) 320 MG tablet Take 1 tablet (320 mg total) by mouth daily. 90 tablet 1   No current facility-administered medications on file prior to visit.   Past Medical History:  Diagnosis Date  . Allergy   . Anemia   . Arthritis   . Bone spur of acromioclavicular joint, left   . Cataract   . Diabetes mellitus without complication ([place])   . GERD (gastroesophageal reflux disease)   . Hyperlipidemia   . Hypertension   . Nonrheumatic aortic (valve) stenosis   . Plantar fasciitis   . Sleep apnea    cpap   Past  Surgical History:  Procedure Laterality Date  . BACK SURGERY  11/17/2016   L3-L5  . BILATERAL CARPAL TUNNEL RELEASE    . bone spur Left    left shoulder  . bone spur Right    Right shoulder  . COLONOSCOPY  08/22/2011   Moderate predominantly sigmoid diverticulosis. Small internal hemorrhoids.   Marland Kitchen HEMORROIDECTOMY  02/2019  . NECK SURGERY    . NECK SURGERY    . RIGHT/LEFT HEART CATH AND CORONARY ANGIOGRAPHY N/A 07/27/2020   Procedure: RIGHT/LEFT HEART CATH AND CORONARY ANGIOGRAPHY;  Surgeon: Sherren Mocha, MD;  Location: [facility];  Service: Cardiovascular;  Laterality: N/A;  . torn rotator cuff Right    Right shoulder  . torn rotator cuff Left    left shoulder  . TRIGGER FINGER RELEASE Left     thumb  . TRIGGER FINGER RELEASE Right    thumb and middle finger  . UPPER GASTROINTESTINAL ENDOSCOPY    . WRIST SURGERY Left    torn ligament  . WRIST SURGERY Right    cyst    Family History  Problem Relation Age of Onset  . Colon cancer Paternal Grandmother   . Esophageal cancer Neg Hx   . Rectal cancer Neg Hx   . Stomach cancer Neg Hx    Social History   Socioeconomic History  . Marital status: Married    Spouse name: Not on file  . Number of children: 2  . Years of education: Not on file  . Highest education level: Not on file  Occupational History  . Not on file  Tobacco Use  . Smoking status: Never Smoker  . Smokeless tobacco: Never Used  Vaping Use  . Vaping Use: Never used  Substance and Sexual Activity  . Alcohol use: Never  . Drug use: Never  . Sexual activity: Not on file  Other Topics Concern  . Not on file  Social History Narrative  . Not on file   Social Determinants of Health   Financial Resource Strain: Not on file  Food Insecurity: No Food Insecurity  . Worried About Charity fundraiser in the Last Year: Never true  . Ran Out of Food in the Last Year: Never true  Transportation Needs: No Transportation Needs  . Lack of Transportation (Medical): No  . Lack of Transportation (Non-Medical): No  Physical Activity: Not on file  Stress: Not on file  Social Connections: Not on file    Review of Systems  Constitutional: Negative for chills, fatigue and fever.  HENT: Negative for congestion, rhinorrhea and sore throat.   Respiratory: Negative for cough and shortness of breath.   Cardiovascular: Negative for chest pain.  Gastrointestinal: Positive for constipation. Negative for abdominal pain, diarrhea, nausea and vomiting.  Genitourinary: Negative for dysuria and urgency.       Loss of bladder control  Hemorrhoids  Musculoskeletal: Positive for back pain. Negative for myalgias.  Neurological: Positive for headaches. Negative for dizziness,  weakness and light-headedness.  Psychiatric/Behavioral: Negative for dysphoric mood. The patient is not nervous/anxious.      Objective:  BP (!) 150/76   Pulse 60   Temp (!) 97.2 F (36.2 C)   Resp 16   Ht 5\' 6"  (1.676 m)   Wt 215 lb (97.5 kg)   BMI 34.70 kg/m   BP/Weight 07/27/2020 07/26/2020 9/32/3557  Systolic BP 322 025 427  Diastolic BP 60 70 76  Wt. (Lbs) 213 213.08 215  BMI 34.38 34.39 34.7  Physical Exam Vitals reviewed.  Constitutional:      Appearance: Normal appearance.  Neck:     Vascular: No carotid bruit.  Cardiovascular:     Rate and Rhythm: Normal rate and regular rhythm.     Pulses: Normal pulses.     Heart sounds: Normal heart sounds.  Pulmonary:     Effort: Pulmonary effort is normal.     Breath sounds: Normal breath sounds.  Abdominal:     General: Bowel sounds are normal.     Palpations: Abdomen is soft.     Tenderness: There is no abdominal tenderness.     Comments: Anus is tender on right lateral side. No hemorrhoids.  Neurological:     Mental Status: She is alert and oriented to person, place, and time.  Psychiatric:        Mood and Affect: Mood normal.        Behavior: Behavior normal.     Diabetic Foot Exam - Simple   No data filed      Lab Results  Component Value Date   WBC 6.5 07/26/2020   HGB 10.5 (L) 07/27/2020   HCT 31.0 (L) 07/27/2020   PLT 192 07/26/2020   GLUCOSE 131 (H) 07/26/2020   CHOL 116 05/09/2020   TRIG 107 05/09/2020   HDL 45 05/09/2020   LDLCALC 51 05/09/2020   ALT 31 05/09/2020   AST 37 05/09/2020   NA 144 07/27/2020   K 2.6 (LL) 07/27/2020   CL 100 07/26/2020   CREATININE 0.88 07/26/2020   BUN 17 07/26/2020   CO2 26 07/26/2020   HGBA1C 6.8 (H) 05/09/2020   MICROALBUR 10 05/09/2020      Assessment & Plan:   1. Anal pain: affecting quality of life.  Rx for Nfedipine 5% ointment sent to [facility] per Dr. [name] with 1 year of refills. Patient is aware.  2. Other chest pain  Follow  up with cardiology in the next week.  3. Heart murmur: concerning for AS.  Keep follow up with cardiology  Follow-up: Return if symptoms worsen or fail to improve.  An After Visit Summary was printed and given to the patient.  Rochel Brome, MD [facility] [phone]

## 2020-07-19 ENCOUNTER — Other Ambulatory Visit: Payer: Self-pay

## 2020-07-19 ENCOUNTER — Telehealth: Payer: Self-pay

## 2020-07-19 NOTE — Telephone Encounter (Signed)
Rx for Nfedipine 5% ointment sent to [facility] per Dr. [name] with 1 year of refills. Patient is aware.

## 2020-07-19 NOTE — Telephone Encounter (Signed)
Patient left vm stating that [facility] does not her rx that you were going to send in yesterday.

## 2020-07-25 NOTE — Progress Notes (Signed)
Cardiology Office Note:    Date:  07/26/2020   ID:  Megan Galvan, DOB 1950-05-09, MRN [id number]  PCP:  Rochel Brome, MD  Cardiologist:  Shirlee More, MD   Referring MD: Rip Harbour, NP  ASSESSMENT:    1. Nonrheumatic aortic (valve) stenosis   2. Essential hypertension   3. Bicuspid aortic valve   4. Mixed hyperlipidemia    PLAN:    In order of problems listed above:  1. She is symptomatic having typical angina and has critical AS greater than 5 m/s squared.  I advised her husband the need for imminent coronary angiography and intervention and may require surgical AVR with a bicuspid valve depending on experience of the valve group and findings of coronary angiography.  She will be scheduled for left and right heart catheterization 2. Stable BP at target continue current treatment beta-blocker thiazide and ARB. 3. Stable continue her statin  Next appointment 6 weeks   Medication Adjustments/Labs and Tests Ordered: Current medicines are reviewed at length with the patient today.  Concerns regarding medicines are outlined above.  Orders Placed This Encounter  Procedures  . Basic metabolic panel  . CBC  . EKG 12-Lead   No orders of the defined types were placed in this encounter.    Chief Complaint  Patient presents with  . Aortic Stenosis    She has critical aortic stenosis with recent ED visit chest pain    History of Present Illness:    Megan Galvan is a 70 y.o. female with a history of hypertension hyperlipidemia and diabetes mellitus ho is being seen today for the evaluation of a heart murmur at the request of Rip Harbour, NP.  She had an echocardiogram performed [facility] 07/18/2020.  I cannot review the images but the report describes the left ventricle is having normal systolic function EF 60 to 65% with moderate concentric left ventricular hypertrophy and mild to moderate left atrial enlargement.  Aortic valve is abnormal  with mild regurgitation and severe stenosis with a mean gradient of 67 mmHg other findings included mild mitral regurgitation.  The aortic valve VTI ratio was diminished 0.18 and stroke-volume was low normal at 35 cc/m the aortic valve peak velocity exceeded 5 m/s with peak and mean gradients of 121 and 67 mmHg.  Her aortic stenosis is very severe.  She was seen by her PCP 06/22/2020  07/18/2020 with chest pain.  She is in the emergency room [facility] 06/19/2020 after urgent care visit with an abnormal EKG and noted to have a loud systolic ejection murmur of aortic stenosis.  Laboratory studies show a normal high-sensitivity troponin hemoglobin 11.9 mildly diminished potassium 3.4 creatinine 0.86 and her EKG was described as sinus rhythm with no ischemia.  Chest x-ray was performed showing no active cardiovascular disease and she was discharged from the hospital with recommendations for outpatient echocardiogram and cardiology evaluation.  She had a total of 3 high-sensitivity troponins recorded at 311 and 14 without significant delta and BNP mildly elevated at 255.  She was given potassium supplement in the ED.  Additional records including myocardial perfusion study at [facility] 02/21/2015 EF 73% normal myocardial perfusion.  She had echocardiogram done at [facility] in 2015 which showed a bicuspid aortic valve with mild aortic stenosis peak and mean gradients 27 to 14 mmHg normal left ventricular function.  She remains very active and intermittently has episodes of chest pain fairly typical angina  aching substernal radiates to the jaw with activity relieved with rest.  The day she went to the hospital was more prolonged and more severe and spontaneously resolved.  She has not had syncope she is not short of breath.  She has a long-term problem with hemorrhoids will have a little blood on the toilet paper but has not had GI or GU bleeding with her severe aortic stenosis.   She was unaware that she had valvular heart disease heart murmur. Past Medical History:  Diagnosis Date  . Allergy   . Anemia   . Arthritis   . Bone spur of acromioclavicular joint, left   . Cataract   . Diabetes mellitus without complication ([place])   . GERD (gastroesophageal reflux disease)   . Heart murmur   . Hyperlipidemia   . Hypertension   . Nonrheumatic aortic (valve) stenosis   . Plantar fasciitis   . Sleep apnea    cpap    Past Surgical History:  Procedure Laterality Date  . BACK SURGERY  11/17/2016   L3-L5  . BILATERAL CARPAL TUNNEL RELEASE    . bone spur Left    left shoulder  . bone spur Right    Right shoulder  . COLONOSCOPY  08/22/2011   Moderate predominantly sigmoid diverticulosis. Small internal hemorrhoids.   Marland Kitchen HEMORROIDECTOMY  02/2019  . NECK SURGERY    . NECK SURGERY    . torn rotator cuff Right    Right shoulder  . torn rotator cuff Left    left shoulder  . TRIGGER FINGER RELEASE Left    thumb  . TRIGGER FINGER RELEASE Right    thumb and middle finger  . UPPER GASTROINTESTINAL ENDOSCOPY    . WRIST SURGERY Left    torn ligament  . WRIST SURGERY Right    cyst    Current Medications: Current Meds  Medication Sig  . Accu-Chek Softclix Lancets lancets Use as instructed  . acetaminophen (TYLENOL) 500 MG tablet 500 mg as needed.   Marland Kitchen aspirin EC 81 MG tablet Take 81 mg by mouth daily.  . Calcium Carb-Cholecalciferol (CALCIUM 500 + D3 PO) Take 2 tablets by mouth daily.  . cetirizine (ZYRTEC) 10 MG tablet Take 1 tablet by mouth daily.  . chlorthalidone (HYGROTON) 50 MG tablet Take 1 tablet (50 mg total) by mouth daily. (Patient taking differently: Take 50 mg by mouth in the morning and at bedtime.)  . conjugated estrogens (PREMARIN) vaginal cream Place 1 Applicatorful vaginally 2 (two) times a week.  . famotidine (PEPCID) 40 MG tablet TAKE 1 TABLET BY MOUTH AT BEDTIME  . fluticasone (FLONASE) 50 MCG/ACT nasal spray Place 1 spray into the nose as  needed.  Marland Kitchen glucose blood (ACCU-CHEK AVIVA PLUS) test strip CHECK BLOOD SUGAR ONCE  DAILY  . hydrocortisone (ANUSOL-HC) 25 MG suppository Place 1 suppository (25 mg total) rectally 2 (two) times daily.  . metoprolol succinate (TOPROL-XL) 50 MG 24 hr tablet TAKE ONE-HALF TABLET BY  MOUTH DAILY  . omeprazole (PRILOSEC) 40 MG capsule Take 1 capsule (40 mg total) by mouth daily.  . potassium chloride SA (KLOR-CON) 20 MEQ tablet 2 in am and 1 at night.  . pravastatin (PRAVACHOL) 40 MG tablet Take 1 tablet (40 mg total) by mouth at bedtime.  Marland Kitchen PRESCRIPTION MEDICATION Apply topically as needed. Nifedipine 5% ointment as needed. Rx is sent to [place]. Last called in on 07/19/2020 with 1 yr of refills.  . traZODone (DESYREL) 100 MG tablet TAKE 1 TABLET  BY MOUTH  BEFORE BEDTIME  . valsartan (DIOVAN) 320 MG tablet Take 1 tablet (320 mg total) by mouth daily.     Allergies:   Ace inhibitors, Doxycycline, Keflex [cephalexin], Latex, Nexium [esomeprazole magnesium], and Protonix [pantoprazole sodium]   Social History   Socioeconomic History  . Marital status: Married    Spouse name: Not on file  . Number of children: 2  . Years of education: Not on file  . Highest education level: Not on file  Occupational History  . Not on file  Tobacco Use  . Smoking status: Never Smoker  . Smokeless tobacco: Never Used  Vaping Use  . Vaping Use: Never used  Substance and Sexual Activity  . Alcohol use: Never  . Drug use: Never  . Sexual activity: Not on file  Other Topics Concern  . Not on file  Social History Narrative  . Not on file   Social Determinants of Health   Financial Resource Strain: Not on file  Food Insecurity: No Food Insecurity  . Worried About Charity fundraiser in the Last Year: Never true  . Ran Out of Food in the Last Year: Never true  Transportation Needs: No Transportation Needs  . Lack of Transportation (Medical): No  . Lack of Transportation (Non-Medical): No   Physical Activity: Not on file  Stress: Not on file  Social Connections: Not on file     Family History: The patient's family history includes Colon cancer in her paternal grandmother. There is no history of Esophageal cancer, Rectal cancer, or Stomach cancer.  ROS:   ROS Please see the history of present illness.     All other systems reviewed and are negative.  EKGs/Labs/Other Studies Reviewed:    The following studies were reviewed today:   EKG:  EKG is  ordered today.  The ekg ordered today is personally reviewed and demonstrates sinus bradycardia 49 bpm  Recent Labs: 05/09/2020: ALT 31; BUN 16; Creatinine, Ser 0.83; Hemoglobin 11.7; Platelets 203; Potassium 4.4; Sodium 144  Recent Lipid Panel    Component Value Date/Time   CHOL 116 05/09/2020 1132   TRIG 107 05/09/2020 1132   HDL 45 05/09/2020 1132   CHOLHDL 2.6 05/09/2020 1132   LDLCALC 51 05/09/2020 1132    Physical Exam:    VS:  BP (!) 144/70   Pulse (!) 49   Ht 5\' 6"  (1.676 m)   Wt 213 lb 1.3 oz (96.7 kg)   SpO2 97%   BMI 34.39 kg/m     Wt Readings from Last 3 Encounters:  07/26/20 213 lb 1.3 oz (96.7 kg)  07/18/20 215 lb (97.5 kg)  06/22/20 213 lb (96.6 kg)     GEN: She does not look chronically ill well nourished, well developed in no acute distress HEENT: Normal NECK: No JVD; small volume carotids diminished upstroke with bilateral transmitted murmur LYMPHATICS: No lymphadenopathy CARDIAC: Harsh 346 AAS murmur holosystolic encompasses S2 single radiates to the carotids bilaterally no aortic regurgitation RRR, norubs, gallops RESPIRATORY:  Clear to auscultation without rales, wheezing or rhonchi  ABDOMEN: Soft, non-tender, non-distended MUSCULOSKELETAL:  No edema; No deformity  SKIN: Warm and dry NEUROLOGIC:  Alert and oriented x 3 PSYCHIATRIC:  Normal affect     Signed, Shirlee More, MD  07/26/2020 10:29 AM    [facility]

## 2020-07-25 NOTE — H&P (View-Only) (Signed)
Cardiology Office Note:    Date:  07/26/2020   ID:  Megan Galvan, DOB 25-Mar-1951, MRN [id number]  PCP:  Megan Brome, MD  Cardiologist:  Megan More, MD   Referring MD: Megan Harbour, NP  ASSESSMENT:    1. Nonrheumatic aortic (valve) stenosis   2. Essential hypertension   3. Bicuspid aortic valve   4. Mixed hyperlipidemia    PLAN:    In order of problems listed above:  1. She is symptomatic having typical angina and has critical AS greater than 5 m/s squared.  I advised her husband the need for imminent coronary angiography and intervention and may require surgical AVR with a bicuspid valve depending on experience of the valve group and findings of coronary angiography.  She will be scheduled for left and right heart catheterization 2. Stable BP at target continue current treatment beta-blocker thiazide and ARB. 3. Stable continue her statin  Next appointment 6 weeks   Medication Adjustments/Labs and Tests Ordered: Current medicines are reviewed at length with the patient today.  Concerns regarding medicines are outlined above.  Orders Placed This Encounter  Procedures  . Basic metabolic panel  . CBC  . EKG 12-Lead   No orders of the defined types were placed in this encounter.    Chief Complaint  Patient presents with  . Aortic Stenosis    She has critical aortic stenosis with recent ED visit chest pain    History of Present Illness:    Megan Galvan is a 70 y.o. female with a history of hypertension hyperlipidemia and diabetes mellitus ho is being seen today for the evaluation of a heart murmur at the request of Megan Harbour, NP.  She had an echocardiogram performed [facility] 07/18/2020.  I cannot review the images but the report describes the left ventricle is having normal systolic function EF 60 to 65% with moderate concentric left ventricular hypertrophy and mild to moderate left atrial enlargement.  Aortic valve is abnormal  with mild regurgitation and severe stenosis with a mean gradient of 67 mmHg other findings included mild mitral regurgitation.  The aortic valve VTI ratio was diminished 0.18 and stroke-volume was low normal at 35 cc/m the aortic valve peak velocity exceeded 5 m/s with peak and mean gradients of 121 and 67 mmHg.  Her aortic stenosis is very severe.  She was seen by her PCP 06/22/2020  07/18/2020 with chest pain.  She is in the emergency room [facility] 06/19/2020 after urgent care visit with an abnormal EKG and noted to have a loud systolic ejection murmur of aortic stenosis.  Laboratory studies show a normal high-sensitivity troponin hemoglobin 11.9 mildly diminished potassium 3.4 creatinine 0.86 and her EKG was described as sinus rhythm with no ischemia.  Chest x-ray was performed showing no active cardiovascular disease and she was discharged from the hospital with recommendations for outpatient echocardiogram and cardiology evaluation.  She had a total of 3 high-sensitivity troponins recorded at 311 and 14 without significant delta and BNP mildly elevated at 255.  She was given potassium supplement in the ED.  Additional records including myocardial perfusion study at [facility] 02/21/2015 EF 73% normal myocardial perfusion.  She had echocardiogram done at [facility] in 2015 which showed a bicuspid aortic valve with mild aortic stenosis peak and mean gradients 27 to 14 mmHg normal left ventricular function.  She remains very active and intermittently has episodes of chest pain fairly typical angina  aching substernal radiates to the jaw with activity relieved with rest.  The day she went to the hospital was Galvan prolonged and Galvan severe and spontaneously resolved.  She has not had syncope she is not short of breath.  She has a long-term problem with hemorrhoids will have a little blood on the toilet paper but has not had GI or GU bleeding with her severe aortic stenosis.   She was unaware that she had valvular heart disease heart murmur. Past Medical History:  Diagnosis Date  . Allergy   . Anemia   . Arthritis   . Bone spur of acromioclavicular joint, left   . Cataract   . Diabetes mellitus without complication ([employer] [facility])   . GERD (gastroesophageal reflux disease)   . Heart murmur   . Hyperlipidemia   . Hypertension   . Nonrheumatic aortic (valve) stenosis   . Plantar fasciitis   . Sleep apnea    cpap    Past Surgical History:  Procedure Laterality Date  . BACK SURGERY  11/17/2016   L3-L5  . BILATERAL CARPAL TUNNEL RELEASE    . bone spur Left    left shoulder  . bone spur Right    Right shoulder  . COLONOSCOPY  08/22/2011   Moderate predominantly sigmoid diverticulosis. Small internal hemorrhoids.   Megan Galvan HEMORROIDECTOMY  02/2019  . NECK SURGERY    . NECK SURGERY    . torn rotator cuff Right    Right shoulder  . torn rotator cuff Left    left shoulder  . TRIGGER FINGER RELEASE Left    thumb  . TRIGGER FINGER RELEASE Right    thumb and middle finger  . UPPER GASTROINTESTINAL ENDOSCOPY    . WRIST SURGERY Left    torn ligament  . WRIST SURGERY Right    cyst    Current Medications: Current Meds  Medication Sig  . Accu-Chek Softclix Lancets lancets Use as instructed  . acetaminophen (TYLENOL) 500 MG tablet 500 mg as needed.   Megan Galvan aspirin EC 81 MG tablet Take 81 mg by mouth daily.  . Calcium Carb-Cholecalciferol (CALCIUM 500 + D3 PO) Take 2 tablets by mouth daily.  . cetirizine (ZYRTEC) 10 MG tablet Take 1 tablet by mouth daily.  . chlorthalidone (HYGROTON) 50 MG tablet Take 1 tablet (50 mg total) by mouth daily. (Patient taking differently: Take 50 mg by mouth in the morning and at bedtime.)  . conjugated estrogens (PREMARIN) vaginal cream Place 1 Applicatorful vaginally 2 (two) times a week.  . famotidine (PEPCID) 40 MG tablet TAKE 1 TABLET BY MOUTH AT BEDTIME  . fluticasone (FLONASE) 50 MCG/ACT nasal spray Place 1 spray into the nose as  needed.  Megan Galvan glucose blood (ACCU-CHEK AVIVA PLUS) test strip CHECK BLOOD SUGAR ONCE  DAILY  . hydrocortisone (ANUSOL-HC) 25 MG suppository Place 1 suppository (25 mg total) rectally 2 (two) times daily.  . metoprolol succinate (TOPROL-XL) 50 MG 24 hr tablet TAKE ONE-HALF TABLET BY  MOUTH DAILY  . omeprazole (PRILOSEC) 40 MG capsule Take 1 capsule (40 mg total) by mouth daily.  . potassium chloride SA (KLOR-CON) 20 MEQ tablet 2 in am and 1 at night.  . pravastatin (PRAVACHOL) 40 MG tablet Take 1 tablet (40 mg total) by mouth at bedtime.  Megan Galvan PRESCRIPTION MEDICATION Apply topically as needed. Nifedipine 5% ointment as needed. Rx is sent to [place]. Last called in on 07/19/2020 with 1 yr of refills.  . traZODone (DESYREL) 100 MG tablet TAKE 1 TABLET  BY MOUTH  BEFORE BEDTIME  . valsartan (DIOVAN) 320 MG tablet Take 1 tablet (320 mg total) by mouth daily.     Allergies:   Ace inhibitors, Doxycycline, Keflex [cephalexin], Latex, Nexium [esomeprazole magnesium], and Protonix [pantoprazole sodium]   Social History   Socioeconomic History  . Marital status: Married    Spouse name: Not on file  . Number of children: 2  . Years of education: Not on file  . Highest education level: Not on file  Occupational History  . Not on file  Tobacco Use  . Smoking status: Never Smoker  . Smokeless tobacco: Never Used  Vaping Use  . Vaping Use: Never used  Substance and Sexual Activity  . Alcohol use: Never  . Drug use: Never  . Sexual activity: Not on file  Other Topics Concern  . Not on file  Social History Narrative  . Not on file   Social Determinants of Health   Financial Resource Strain: Not on file  Food Insecurity: No Food Insecurity  . Worried About Charity fundraiser in the Last Year: Never true  . Ran Out of Food in the Last Year: Never true  Transportation Needs: No Transportation Needs  . Lack of Transportation (Medical): No  . Lack of Transportation (Non-Medical): No   Physical Activity: Not on file  Stress: Not on file  Social Connections: Not on file     Family History: The patient's family history includes Colon cancer in her paternal grandmother. There is no history of Esophageal cancer, Rectal cancer, or Stomach cancer.  ROS:   ROS Please see the history of present illness.     All other systems reviewed and are negative.  EKGs/Labs/Other Studies Reviewed:    The following studies were reviewed today:   EKG:  EKG is  ordered today.  The ekg ordered today is personally reviewed and demonstrates sinus bradycardia 49 bpm  Recent Labs: 05/09/2020: ALT 31; BUN 16; Creatinine, Ser 0.83; Hemoglobin 11.7; Platelets 203; Potassium 4.4; Sodium 144  Recent Lipid Panel    Component Value Date/Time   CHOL 116 05/09/2020 1132   TRIG 107 05/09/2020 1132   HDL 45 05/09/2020 1132   CHOLHDL 2.6 05/09/2020 1132   LDLCALC 51 05/09/2020 1132    Physical Exam:    VS:  BP (!) 144/70   Pulse (!) 49   Ht 5\' 6"  (1.676 m)   Wt 213 lb 1.3 oz (96.7 kg)   SpO2 97%   BMI 34.39 kg/m     Wt Readings from Last 3 Encounters:  07/26/20 213 lb 1.3 oz (96.7 kg)  07/18/20 215 lb (97.5 kg)  06/22/20 213 lb (96.6 kg)     GEN: She does not look chronically ill well nourished, well developed in no acute distress HEENT: Normal NECK: No JVD; small volume carotids diminished upstroke with bilateral transmitted murmur LYMPHATICS: No lymphadenopathy CARDIAC: Harsh 346 AAS murmur holosystolic encompasses S2 single radiates to the carotids bilaterally no aortic regurgitation RRR, norubs, gallops RESPIRATORY:  Clear to auscultation without rales, wheezing or rhonchi  ABDOMEN: Soft, non-tender, non-distended MUSCULOSKELETAL:  No edema; No deformity  SKIN: Warm and dry NEUROLOGIC:  Alert and oriented x 3 PSYCHIATRIC:  Normal affect     Signed, Megan More, MD  07/26/2020 10:29 AM    [facility]

## 2020-07-26 ENCOUNTER — Encounter: Payer: Self-pay | Admitting: Cardiology

## 2020-07-26 ENCOUNTER — Ambulatory Visit (INDEPENDENT_AMBULATORY_CARE_PROVIDER_SITE_OTHER): Payer: Medicare Other | Admitting: Cardiology

## 2020-07-26 ENCOUNTER — Other Ambulatory Visit: Payer: Self-pay

## 2020-07-26 ENCOUNTER — Other Ambulatory Visit (HOSPITAL_COMMUNITY)
Admission: RE | Admit: 2020-07-26 | Discharge: 2020-07-26 | Disposition: A | Discharge status: Home / Self Care | Payer: Medicare Other | Source: Ambulatory Visit | Attending: Cardiovascular Disease | Admitting: Cardiovascular Disease

## 2020-07-26 VITALS — BP 144/70 | HR 49 | Ht 66.0 in | Wt 213.1 lb

## 2020-07-26 DIAGNOSIS — I350 Nonrheumatic aortic (valve) stenosis: Secondary | ICD-10-CM

## 2020-07-26 DIAGNOSIS — Z20822 Contact with and (suspected) exposure to covid-19: Secondary | ICD-10-CM | POA: Insufficient documentation

## 2020-07-26 DIAGNOSIS — Q231 Congenital insufficiency of aortic valve: Secondary | ICD-10-CM | POA: Diagnosis not present

## 2020-07-26 DIAGNOSIS — E782 Mixed hyperlipidemia: Secondary | ICD-10-CM

## 2020-07-26 DIAGNOSIS — I10 Essential (primary) hypertension: Secondary | ICD-10-CM | POA: Diagnosis not present

## 2020-07-26 DIAGNOSIS — Z01812 Encounter for preprocedural laboratory examination: Secondary | ICD-10-CM | POA: Diagnosis not present

## 2020-07-26 LAB — SARS CORONAVIRUS 2 (TAT 6-24 HRS): SARS Coronavirus 2: NEGATIVE

## 2020-07-26 NOTE — Patient Instructions (Signed)
Medication Instructions:  Your physician recommends that you continue on your current medications as directed. Please refer to the Current Medication list given to you today.  *If you need a refill on your cardiac medications before your next appointment, please call your pharmacy*   Lab Work: Your physician recommends that you return for lab work in: TODAY CBC, BMP If you have labs (blood work) drawn today and your tests are completely normal, you will receive your results only by: Marland Kitchen MyChart Message (if you have MyChart) OR . A paper copy in the mail If you have any lab test that is abnormal or we need to change your treatment, we will call you to review the results.   Testing/Procedures:    [facility] [address], [place] [address] Dept: [phone] Loc: [place]  07/26/2020  You are scheduled for a Cardiac Catheterization on Thursday, March 31 with Dr. Sherren Mocha.  1. Please arrive at the [facility] (Main Entrance A) at [facility]: [address] [place], [place] [address] at 1:30 PM (This time is two hours before your procedure to ensure your preparation). Free valet parking service is available.   Special note: Every effort is made to have your procedure done on time. Please understand that emergencies sometimes delay scheduled procedures.  2. Diet: Do not eat solid foods after midnight.  The patient may have clear liquids until 5am upon the day of the procedure.  3. Labs: YOU HAD YOUR LABS DRAWN TODAY 07/26/2020  4. Medication instructions in preparation for your procedure:   Contrast Allergy: No   Take only half of your units of insulin the night before your procedure. Do not take any insulin on the day of the procedure.   On the morning of your procedure, take your Aspirin and any morning medicines NOT listed above.  You may use sips  of water.  5. Plan for one night stay--bring personal belongings. 6. Bring a current list of your medications and current insurance cards. 7. You MUST have a responsible person to drive you home. 8. Someone MUST be with you the first 24 hours after you arrive home or your discharge will be delayed. 9. Please wear clothes that are easy to get on and off and wear slip-on shoes.  Thank you for allowing [place] to care for you!   -- [facility]    Follow-Up: At [facility], you and your health needs are our priority.  As part of our continuing mission to provide you with exceptional heart care, we have created designated Provider Care Teams.  These Care Teams include your primary Cardiologist (physician) and Advanced Practice Providers (APPs -  Physician Assistants and Nurse Practitioners) who all work together to provide you with the care you need, when you need it.  We recommend signing up for the patient portal called "MyChart".  Sign up information is provided on this After Visit Summary.  MyChart is used to connect with patients for Virtual Visits (Telemedicine).  Patients are able to view lab/test results, encounter notes, upcoming appointments, etc.  Non-urgent messages can be sent to your provider as well.   To learn more about what you can do with MyChart, go to [email or web address].    Your next appointment:   6 week(s)  The format for your next appointment:   In Person  Provider:   Shirlee More,  MD   Other Instructions

## 2020-07-27 ENCOUNTER — Telehealth: Payer: Self-pay

## 2020-07-27 ENCOUNTER — Encounter (HOSPITAL_COMMUNITY)
Admission: RE | Disposition: A | Discharge status: Home / Self Care | Payer: Self-pay | Source: Home / Self Care | Attending: Cardiovascular Disease

## 2020-07-27 ENCOUNTER — Ambulatory Visit: Payer: Medicare Other | Admitting: Cardiology

## 2020-07-27 ENCOUNTER — Ambulatory Visit (HOSPITAL_COMMUNITY)
Admission: RE | Admit: 2020-07-27 | Discharge: 2020-07-27 | Disposition: A | Discharge status: Home / Self Care | Payer: Medicare Other | Attending: Cardiovascular Disease | Admitting: Cardiovascular Disease

## 2020-07-27 ENCOUNTER — Other Ambulatory Visit: Payer: Self-pay

## 2020-07-27 DIAGNOSIS — I350 Nonrheumatic aortic (valve) stenosis: Secondary | ICD-10-CM

## 2020-07-27 DIAGNOSIS — I10 Essential (primary) hypertension: Secondary | ICD-10-CM | POA: Insufficient documentation

## 2020-07-27 DIAGNOSIS — E782 Mixed hyperlipidemia: Secondary | ICD-10-CM | POA: Insufficient documentation

## 2020-07-27 DIAGNOSIS — Z7982 Long term (current) use of aspirin: Secondary | ICD-10-CM | POA: Diagnosis not present

## 2020-07-27 DIAGNOSIS — I272 Pulmonary hypertension, unspecified: Secondary | ICD-10-CM | POA: Diagnosis not present

## 2020-07-27 DIAGNOSIS — I208 Other forms of angina pectoris: Secondary | ICD-10-CM | POA: Insufficient documentation

## 2020-07-27 DIAGNOSIS — E119 Type 2 diabetes mellitus without complications: Secondary | ICD-10-CM | POA: Diagnosis not present

## 2020-07-27 DIAGNOSIS — Z79899 Other long term (current) drug therapy: Secondary | ICD-10-CM | POA: Insufficient documentation

## 2020-07-27 DIAGNOSIS — Z888 Allergy status to other drugs, medicaments and biological substances status: Secondary | ICD-10-CM | POA: Diagnosis not present

## 2020-07-27 DIAGNOSIS — Z91040 Latex allergy status: Secondary | ICD-10-CM | POA: Insufficient documentation

## 2020-07-27 DIAGNOSIS — Z881 Allergy status to other antibiotic agents status: Secondary | ICD-10-CM | POA: Diagnosis not present

## 2020-07-27 HISTORY — PX: RIGHT/LEFT HEART CATH AND CORONARY ANGIOGRAPHY: CATH118266

## 2020-07-27 LAB — BASIC METABOLIC PANEL
BUN/Creatinine Ratio: 19 (ref 12–28)
BUN: 17 mg/dL (ref 8–27)
CO2: 26 mmol/L (ref 20–29)
Calcium: 9.4 mg/dL (ref 8.7–10.3)
Chloride: 100 mmol/L (ref 96–106)
Creatinine, Ser: 0.88 mg/dL (ref 0.57–1.00)
Glucose: 131 mg/dL — ABNORMAL HIGH (ref 65–99)
Potassium: 4.1 mmol/L (ref 3.5–5.2)
Sodium: 142 mmol/L (ref 134–144)
eGFR: 71 mL/min/{1.73_m2} (ref >59)

## 2020-07-27 LAB — CBC
Hematocrit: 36 % (ref 34.0–46.6)
Hemoglobin: 12.2 g/dL (ref 11.1–15.9)
MCH: 33.2 pg — ABNORMAL HIGH (ref 26.6–33.0)
MCHC: 33.9 g/dL (ref 31.5–35.7)
MCV: 98 fL — ABNORMAL HIGH (ref 79–97)
Platelets: 192 10*3/uL (ref 150–450)
RBC: 3.68 x10E6/uL — ABNORMAL LOW (ref 3.77–5.28)
RDW: 12.9 % (ref 11.7–15.4)
WBC: 6.5 10*3/uL (ref 3.4–10.8)

## 2020-07-27 LAB — POCT I-STAT EG7
Acid-Base Excess: 4 mmol/L — ABNORMAL HIGH (ref 0.0–2.0)
Bicarbonate: 31 mmol/L — ABNORMAL HIGH (ref 20.0–28.0)
Calcium, Ion: 1.15 mmol/L (ref 1.15–1.40)
HCT: 31 % — ABNORMAL LOW (ref 36.0–46.0)
Hemoglobin: 10.5 g/dL — ABNORMAL LOW (ref 12.0–15.0)
O2 Saturation: 78 %
Potassium: 2.6 mmol/L — CL (ref 3.5–5.1)
Sodium: 144 mmol/L (ref 135–145)
TCO2: 33 mmol/L — ABNORMAL HIGH (ref 22–32)
pCO2, Ven: 56.8 mmHg (ref 44.0–60.0)
pH, Ven: 7.346 (ref 7.250–7.430)
pO2, Ven: 46 mmHg — ABNORMAL HIGH (ref 32.0–45.0)

## 2020-07-27 LAB — GLUCOSE, CAPILLARY
Glucose-Capillary: 110 mg/dL — ABNORMAL HIGH (ref 70–99)
Glucose-Capillary: 86 mg/dL (ref 70–99)

## 2020-07-27 SURGERY — RIGHT/LEFT HEART CATH AND CORONARY ANGIOGRAPHY
Anesthesia: LOCAL

## 2020-07-27 MED ORDER — HEPARIN (PORCINE) IN NACL 1000-0.9 UT/500ML-% IV SOLN
INTRAVENOUS | Status: AC
  Filled 2020-07-27: qty 1000

## 2020-07-27 MED ORDER — SODIUM CHLORIDE 0.9 % IV SOLN: 250.0000 mL | INTRAVENOUS | Status: DC | PRN

## 2020-07-27 MED ORDER — VERAPAMIL HCL 2.5 MG/ML IV SOLN
INTRAVENOUS | Status: DC | PRN
  Administered 2020-07-27: 10 mL via INTRA_ARTERIAL

## 2020-07-27 MED ORDER — MIDAZOLAM HCL 2 MG/2ML IJ SOLN
INTRAMUSCULAR | Status: AC
  Filled 2020-07-27: qty 2

## 2020-07-27 MED ORDER — SODIUM CHLORIDE 0.9% FLUSH: 3.0000 mL | Freq: Two times a day (BID) | INTRAVENOUS | Status: DC

## 2020-07-27 MED ORDER — SODIUM CHLORIDE 0.9 % WEIGHT BASED INFUSION
3.0000 mL/kg/h | INTRAVENOUS | Status: AC
  Administered 2020-07-27: 3 mL/kg/h via INTRAVENOUS

## 2020-07-27 MED ORDER — LIDOCAINE HCL (PF) 1 % IJ SOLN
INTRAMUSCULAR | Status: DC | PRN
  Administered 2020-07-27: 4 mL

## 2020-07-27 MED ORDER — LIDOCAINE HCL (PF) 1 % IJ SOLN
INTRAMUSCULAR | Status: AC
  Filled 2020-07-27: qty 30

## 2020-07-27 MED ORDER — SODIUM CHLORIDE 0.9 % WEIGHT BASED INFUSION: 1.0000 mL/kg/h | INTRAVENOUS | Status: DC

## 2020-07-27 MED ORDER — HEPARIN SODIUM (PORCINE) 1000 UNIT/ML IJ SOLN
INTRAMUSCULAR | Status: AC
  Filled 2020-07-27: qty 1

## 2020-07-27 MED ORDER — FENTANYL CITRATE (PF) 100 MCG/2ML IJ SOLN
INTRAMUSCULAR | Status: DC | PRN
  Administered 2020-07-27 (×2): 25 ug via INTRAVENOUS

## 2020-07-27 MED ORDER — ASPIRIN 81 MG PO CHEW: 81.0000 mg | CHEWABLE_TABLET | ORAL | Status: DC

## 2020-07-27 MED ORDER — FENTANYL CITRATE (PF) 100 MCG/2ML IJ SOLN
INTRAMUSCULAR | Status: AC
  Filled 2020-07-27: qty 2

## 2020-07-27 MED ORDER — HEPARIN (PORCINE) IN NACL 1000-0.9 UT/500ML-% IV SOLN
INTRAVENOUS | Status: DC | PRN
  Administered 2020-07-27 (×2): 500 mL

## 2020-07-27 MED ORDER — HEPARIN SODIUM (PORCINE) 1000 UNIT/ML IJ SOLN
INTRAMUSCULAR | Status: DC | PRN
  Administered 2020-07-27: 5000 [IU] via INTRAVENOUS

## 2020-07-27 MED ORDER — SODIUM CHLORIDE 0.9% FLUSH: 3.0000 mL | INTRAVENOUS | Status: DC | PRN

## 2020-07-27 MED ORDER — IOHEXOL 350 MG/ML SOLN
INTRAVENOUS | Status: DC | PRN
  Administered 2020-07-27: 45 mL via INTRA_ARTERIAL

## 2020-07-27 MED ORDER — VERAPAMIL HCL 2.5 MG/ML IV SOLN
INTRAVENOUS | Status: AC
  Filled 2020-07-27: qty 2

## 2020-07-27 MED ORDER — MIDAZOLAM HCL 2 MG/2ML IJ SOLN
INTRAMUSCULAR | Status: DC | PRN
  Administered 2020-07-27 (×2): 1 mg via INTRAVENOUS

## 2020-07-27 SURGICAL SUPPLY — 13 items
BAG SNAP BAND KOVER 36X36 (MISCELLANEOUS) ×1 IMPLANT
CATH 5FR JL3.5 JR4 ANG PIG MP (CATHETERS) ×1 IMPLANT
CATH SWAN GANZ 7F STRAIGHT (CATHETERS) ×1 IMPLANT
COVER DOME SNAP 22 D (MISCELLANEOUS) ×1 IMPLANT
DEVICE RAD COMP TR BAND LRG (VASCULAR PRODUCTS) ×1 IMPLANT
GLIDESHEATH SLEND SS 6F .021 (SHEATH) ×1 IMPLANT
GLIDESHEATH SLENDER 7FR .021G (SHEATH) ×1 IMPLANT
GUIDEWIRE INQWIRE 1.5J.035X260 (WIRE) IMPLANT
INQWIRE 1.5J .035X260CM (WIRE) ×2
KIT HEART LEFT (KITS) ×2 IMPLANT
PACK CARDIAC CATHETERIZATION (CUSTOM PROCEDURE TRAY) ×2 IMPLANT
TRANSDUCER W/STOPCOCK (MISCELLANEOUS) ×2 IMPLANT
TUBING CIL FLEX 10 FLL-RA (TUBING) ×2 IMPLANT

## 2020-07-27 NOTE — Discharge Instructions (Signed)
Radial Site Care  This sheet gives you information about how to care for yourself after your procedure. Your health care provider may also give you more specific instructions. If you have problems or questions, contact your health care provider. What can I expect after the procedure? After the procedure, it is common to have:  Bruising and tenderness at the catheter insertion area. Follow these instructions at home: Medicines  Take over-the-counter and prescription medicines only as told by your health care provider. Insertion site care  Follow instructions from your health care provider about how to take care of your insertion site. Make sure you: ? Wash your hands with soap and water before you change your bandage (dressing). If soap and water are not available, use hand sanitizer. ? Change your dressing as told by your health care provider. ? Leave stitches (sutures), skin glue, or adhesive strips in place. These skin closures may need to stay in place for 2 weeks or longer. If adhesive strip edges start to loosen and curl up, you may trim the loose edges. Do not remove adhesive strips completely unless your health care provider tells you to do that.  Check your insertion site every day for signs of infection. Check for: ? Redness, swelling, or pain. ? Fluid or blood. ? Pus or a bad smell. ? Warmth.  Do not take baths, swim, or use a hot tub until your health care provider approves.  You may shower 24-48 hours after the procedure, or as directed by your health care provider. ? Remove the dressing and gently wash the site with plain soap and water. ? Pat the area dry with a clean towel. ? Do not rub the site. That could cause bleeding.  Do not apply powder or lotion to the site. Activity  For 24 hours after the procedure, or as directed by your health care provider: ? Do not flex or bend the affected arm. ? Do not push or pull heavy objects with the affected arm. ? Do not drive  yourself home from the hospital or clinic. You may drive 24 hours after the procedure unless your health care provider tells you not to. ? Do not operate machinery or power tools.  Do not lift anything that is heavier than 10 lb (4.5 kg), or the limit that you are told, until your health care provider says that it is safe.  Ask your health care provider when it is okay to: ? Return to work or school. ? Resume usual physical activities or sports. ? Resume sexual activity.   General instructions  If the catheter site starts to bleed, raise your arm and put firm pressure on the site. If the bleeding does not stop, get help right away. This is a medical emergency.  If you went home on the same day as your procedure, a responsible adult should be with you for the first 24 hours after you arrive home.  Keep all follow-up visits as told by your health care provider. This is important. Contact a health care provider if:  You have a fever.  You have redness, swelling, or yellow drainage around your insertion site. Get help right away if:  You have unusual pain at the radial site.  The catheter insertion area swells very fast.  The insertion area is bleeding, and the bleeding does not stop when you hold steady pressure on the area.  Your arm or hand becomes pale, cool, tingly, or numb. These symptoms may represent a serious   problem that is an emergency. Do not wait to see if the symptoms will go away. Get medical help right away. Call your local emergency services (911 in the [place] Do not drive yourself to the hospital. Summary  After the procedure, it is common to have bruising and tenderness at the site.  Follow instructions from your health care provider about how to take care of your radial site wound. Check the wound every day for signs of infection.  Do not lift anything that is heavier than 10 lb (4.5 kg), or the limit that you are told, until your health care provider says that it  is safe. This information is not intended to replace advice given to you by your health care provider. Make sure you discuss any questions you have with your health care provider. Document Revised: 05/21/2017 Document Reviewed: 05/21/2017 Elsevier Patient Education  2021 Elsevier Inc.  

## 2020-07-27 NOTE — Telephone Encounter (Signed)
-----   Message from Richardo Priest, MD sent at 07/27/2020  7:56 AM EDT ----- Labs are good no changes.

## 2020-07-27 NOTE — Telephone Encounter (Signed)
Spoke with patient regarding results and recommendation.  Patient verbalizes understanding and is agreeable to plan of care. Advised patient to call back with any issues or concerns.  

## 2020-07-27 NOTE — Interval H&P Note (Signed)
History and Physical Interval Note:  07/27/2020 4:48 PM  Megan Galvan  has presented today for surgery, with the diagnosis of chest pain - aortic stenosis.  The various methods of treatment have been discussed with the patient and family. After consideration of risks, benefits and other options for treatment, the patient has consented to  Procedure(s): RIGHT/LEFT HEART CATH AND CORONARY ANGIOGRAPHY (N/A) as a surgical intervention.  The patient's history has been reviewed, patient examined, no change in status, stable for surgery.  I have reviewed the patient's chart and labs.  Questions were answered to the patient's satisfaction.     Sherren Mocha

## 2020-07-28 ENCOUNTER — Encounter (HOSPITAL_COMMUNITY): Payer: Self-pay | Admitting: Cardiovascular Disease

## 2020-07-28 ENCOUNTER — Other Ambulatory Visit: Payer: Self-pay

## 2020-07-28 DIAGNOSIS — I350 Nonrheumatic aortic (valve) stenosis: Secondary | ICD-10-CM

## 2020-07-30 ENCOUNTER — Encounter: Payer: Self-pay | Admitting: Family Medicine

## 2020-07-31 ENCOUNTER — Encounter (HOSPITAL_COMMUNITY): Payer: Self-pay

## 2020-07-31 ENCOUNTER — Other Ambulatory Visit: Payer: Self-pay | Admitting: *Deleted

## 2020-07-31 ENCOUNTER — Other Ambulatory Visit: Payer: Self-pay | Admitting: Physician Assistant

## 2020-07-31 ENCOUNTER — Encounter: Payer: Self-pay | Admitting: Thoracic Surgery (Cardiothoracic Vascular Surgery)

## 2020-07-31 ENCOUNTER — Encounter: Payer: Self-pay | Admitting: Physical Therapy

## 2020-07-31 ENCOUNTER — Ambulatory Visit: Payer: Medicare Other | Attending: Physician Assistant | Admitting: Physical Therapy

## 2020-07-31 ENCOUNTER — Ambulatory Visit (HOSPITAL_COMMUNITY)
Admission: RE | Admit: 2020-07-31 | Discharge: 2020-07-31 | Disposition: A | Discharge status: Home / Self Care | Payer: Medicare Other | Source: Ambulatory Visit | Attending: Cardiovascular Disease | Admitting: Cardiovascular Disease

## 2020-07-31 ENCOUNTER — Institutional Professional Consult (permissible substitution) (INDEPENDENT_AMBULATORY_CARE_PROVIDER_SITE_OTHER): Payer: Medicare Other | Admitting: Thoracic Surgery (Cardiothoracic Vascular Surgery)

## 2020-07-31 ENCOUNTER — Other Ambulatory Visit: Payer: Self-pay | Admitting: Thoracic Surgery (Cardiothoracic Vascular Surgery)

## 2020-07-31 ENCOUNTER — Other Ambulatory Visit: Payer: Self-pay

## 2020-07-31 ENCOUNTER — Ambulatory Visit (HOSPITAL_COMMUNITY): Payer: Medicare Other

## 2020-07-31 VITALS — BP 152/76 | HR 60 | Resp 20 | Ht 66.0 in | Wt 207.0 lb

## 2020-07-31 DIAGNOSIS — I350 Nonrheumatic aortic (valve) stenosis: Secondary | ICD-10-CM

## 2020-07-31 DIAGNOSIS — R293 Abnormal posture: Secondary | ICD-10-CM | POA: Diagnosis not present

## 2020-07-31 DIAGNOSIS — N200 Calculus of kidney: Secondary | ICD-10-CM | POA: Diagnosis not present

## 2020-07-31 DIAGNOSIS — K5730 Diverticulosis of large intestine without perforation or abscess without bleeding: Secondary | ICD-10-CM | POA: Diagnosis not present

## 2020-07-31 DIAGNOSIS — Z01818 Encounter for other preprocedural examination: Secondary | ICD-10-CM | POA: Diagnosis not present

## 2020-07-31 MED ORDER — IOHEXOL 350 MG/ML SOLN
100.0000 mL | Freq: Once | INTRAVENOUS | Status: AC | PRN
  Administered 2020-07-31: 100 mL via INTRAVENOUS

## 2020-07-31 NOTE — Therapy (Signed)
[place], [place], [address] Phone: [phone]   Fax:  [phone]  Physical Therapy Evaluation  Patient Details  Name: Megan Galvan MRN: [id number] Date of Birth: 1950/07/07 Referring Provider (PT): Eileen Stanford, [place]   Encounter Date: 07/31/2020   PT End of Session - 07/31/20 1316    Visit Number 1    Number of Visits 1    Date for PT Re-Evaluation 08/01/20    PT Start Time 1320    PT Stop Time 1348    PT Time Calculation (min) 28 min    Activity Tolerance Patient tolerated treatment well    Behavior During Therapy [facility] for tasks assessed/performed           Past Medical History:  Diagnosis Date  . Allergy   . Anemia   . Arthritis   . Bone spur of acromioclavicular joint, left   . Cataract   . Diabetes mellitus without complication ([place])   . GERD (gastroesophageal reflux disease)   . Hyperlipidemia   . Hypertension   . Nonrheumatic aortic (valve) stenosis   . Plantar fasciitis   . Sleep apnea    cpap    Past Surgical History:  Procedure Laterality Date  . BACK SURGERY  11/17/2016   L3-L5  . BILATERAL CARPAL TUNNEL RELEASE    . bone spur Left    left shoulder  . bone spur Right    Right shoulder  . COLONOSCOPY  08/22/2011   Moderate predominantly sigmoid diverticulosis. Small internal hemorrhoids.   Marland Kitchen HEMORROIDECTOMY  02/2019  . NECK SURGERY    . NECK SURGERY    . RIGHT/LEFT HEART CATH AND CORONARY ANGIOGRAPHY N/A 07/27/2020   Procedure: RIGHT/LEFT HEART CATH AND CORONARY ANGIOGRAPHY;  Surgeon: Sherren Mocha, MD;  Location: [facility];  Service: Cardiovascular;  Laterality: N/A;  . torn rotator cuff Right    Right shoulder  . torn rotator cuff Left    left shoulder  . TRIGGER FINGER RELEASE Left    thumb  . TRIGGER FINGER RELEASE Right    thumb and middle finger  . UPPER GASTROINTESTINAL ENDOSCOPY    . WRIST SURGERY Left    torn ligament  . WRIST  SURGERY Right    cyst    There were no vitals filed for this visit.    Subjective Assessment - 07/31/20 1325    Subjective pt is a 70 y.o F with CC of general fatigue and occasional shortness of breath that has been going on for several months, and reports it seems to be progressively worsening. She reports no other issues today.    Patient Stated Goals to fix heart              [facility] PT Assessment - 07/31/20 1318      Assessment   Medical Diagnosis Severe aortic stenosis    Referring Provider (PT) Eileen Stanford, PA-C    Onset Date/Surgical Date --   March 2022   Hand Dominance Right      Precautions   Precautions None      Restrictions   Weight Bearing Restrictions No      Balance Screen   Has the patient fallen in the past 6 months No      [place] residence    Living Arrangements Spouse/significant other    Available Help at Discharge Family    Type of [place]  Home Access Level entry    Home Layout One level    [place] - 2 wheels;Cane - single point;Crutches      Posture/Postural Control   Posture/Postural Control Postural limitations    Postural Limitations Rounded Shoulders;Forward head      ROM / Strength   AROM / PROM / Strength AROM;Strength      AROM   Overall AROM  Within functional limits for tasks performed      Strength   Overall Strength Within functional limits for tasks performed    Right Hand Grip (lbs) 37    Left Hand Grip (lbs) 26      Ambulation/Gait   Ambulation/Gait Yes    Gait Pattern Step-through pattern    Gait Comments pt ambulated 555 ft in 2:55 requiring seated rest break for 1:20 HR 96, and O2 76%. she was able resume walking after rest walking 350 ft finishing the test.            [facility] Pre-Surgical Assessment - 07/31/20 0001    5 Meter Walk Test- trial 1 4 sec    5 Meter Walk Test- trial 2 4 sec.     5 Meter Walk Test- trial 3 4 sec.    5 meter walk test  average 4 sec    4 Stage Balance Test tolerated for:  1 sec.    4 Stage Balance Test Position 3    Sit To Stand Test- trial 1 20 sec.    ADL/IADL Independent with: Bathing;Dressing;Meal prep;Finances    ADL/IADL Needs Assistance with: Yard work    6 Minute Walk- Baseline yes    BP (mmHg) 149/57    HR (bpm) 51    02 Sat (%RA) 100 %    Modified Borg Scale for Dyspnea 1- Very mild shortness of breath    Perceived Rate of Exertion (Borg) 9- very light    6 Minute Walk Post Test yes    BP (mmHg) 170/72    HR (bpm) 96    02 Sat (%RA) 76 %    Modified Borg Scale for Dyspnea 3- Moderate shortness of breath or breathing difficulty    Perceived Rate of Exertion (Borg) 13- Somewhat hard    Aerobic Endurance Distance Walked 905    Endurance additional comments pt is 48.73% limited compared to age related norm                    Objective measurements completed on examination: See above findings.                            Plan - 07/31/20 1317    Clinical Impression Statement see assessment in note    Stability/Clinical Decision Making Stable/Uncomplicated    Clinical Decision Making Low    PT Frequency One time visit    PT Next Visit Plan Pre-TAVR evaluation    Consulted and Agree with Plan of Care Patient            Clinical Impression Statement: Pt is a 70 yo F presenting to OP PT for evaluation prior to possible TAVR surgery due to severe aortic stenosis. Pt reports onset of SOB and general fatigue approximately 1-2  months ago. Symptoms are limiting walking endurance. Pt presents with good ROM and strength, fair balance and is assessed as moderate at high fall risk 4 stage balance test, good walking speed and limited aerobic endurance per 6 minute  walk test.pt ambulated 555 ft in 2:55 requiring seated rest break for 1:20 HR 96, and O2 76% on room air. she was able resume walking after rest walking 350 ft finishing. Pt reported 3/10 shortness of breath  on modified scale for dyspnea. . Pt ambulated a total of 905 feet in 6 minute walk. SOB, fatigue and headache increased significantly with 6 minute walk test. Based on the Short Physical Performance Battery, patient has a frailty rating of 7/12 with </= 5/12 considered frail.   Patient demonstrated the following deficits and impairments:     Visit Diagnosis: Abnormal posture     Problem List Patient Active Problem List   Diagnosis Date Noted  . Sleep apnea   . Plantar fasciitis   . Nonrheumatic aortic (valve) stenosis   . Hypertension   . Hyperlipidemia   . Heart murmur   . GERD (gastroesophageal reflux disease)   . Diabetes mellitus without complication ([place])   . Cataract   . Bone spur of acromioclavicular joint, left   . Arthritis   . Anemia   . Allergy   . Medicare annual wellness visit, subsequent 03/27/2020  . Essential hypertension 10/01/2019  . Mixed hyperlipidemia   . Vertigo 07/20/2019  . Myalgia, other site 06/07/2019  . Class 1 obesity due to excess calories with serious comorbidity and body mass index (BMI) of 34.0 to 34.9 in adult 06/07/2019  . BMI 35.0-35.9,adult 06/07/2019  . Gastroesophageal reflux disease without esophagitis 06/03/2019  . Type 2 diabetes mellitus with diabetic polyneuropathy ([place]) 06/03/2019  . Primary insomnia 06/03/2019   Starr Lake PT, DPT, LAT, ATC  07/31/20  1:54 PM      [facility] [address] [place], [place], [address] Phone: [phone]   Fax:  [phone]  Name: FEROL LAICHE MRN: [id number] Date of Birth: 12/30/1950

## 2020-07-31 NOTE — H&P (View-Only) (Signed)
HEART AND [place] SURGERY CONSULTATION REPORT  Referring Provider is Bettina Gavia, Hilton Cork, MD PCP is Cox, Elnita Maxwell, MD  Chief Complaint  Megan Galvan presents with  . 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  Megan Galvan states that she has been told that she had a heart murmur all of her life.  She has never been formally evaluated by cardiologist until recently.  She describes a 1 year history of progressive symptoms of exertional shortness of breath and chest tightness which has gotten much worse over the past 4 to 6 weeks.  She now gets short of breath and tightness across her chest with very low level activity and occasionally at rest.  She has had episodes of nocturnal shortness of breath and she cannot lay flat in bed.  She has had some mild dizzy spells without syncope.  She was evaluated by her primary care physician and underwent a transthoracic echocardiogram at [facility] which revealed findings suggestive of bicuspid aortic valve with severe aortic stenosis.  She was evaluated by Dr. Bettina Gavia and subsequently referred to our multidisciplinary heart valve clinic.  She underwent diagnostic cardiac catheterization by Dr. Burt Knack on July 27, 2020 confirming the presence of severe aortic stenosis.  The Megan Galvan was noted to have no significant coronary artery disease and mildly elevated right heart pressures.  CT angiography was performed earlier today and the Megan Galvan referred for surgical consultation.  Megan Galvan is married and lives in [place] with her husband and one of her adult sons.  She  has a second adult son and 2 grandchildren.  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  In retirement she has remained functionally independent although she does not exercise or walk on a regular basis.  She has had some problems with both cervical and lumbar degenerative disc disease but she still gets around fairly well without significant physical limitations.  She describes a 1 year history of progressive symptoms of exertional shortness of breath which is gotten particularly severe over the past 4 to 6 weeks as discussed previously.  She otherwise has no significant functional limitations.  Past Medical History:  Diagnosis Date  . Allergy   . Anemia   . Arthritis   . Bone spur of acromioclavicular joint, left   . Cataract   . Diabetes mellitus without complication ([place])   . GERD (gastroesophageal reflux disease)   . Hyperlipidemia   . Hypertension   . Nonrheumatic aortic (valve) stenosis   . Plantar fasciitis   . Sleep apnea    cpap    Past Surgical History:  Procedure Laterality Date  . BACK SURGERY  11/17/2016   L3-L5  . BILATERAL CARPAL TUNNEL RELEASE    . bone spur Left    left shoulder  . bone spur Right    Right shoulder  . COLONOSCOPY  08/22/2011   Moderate predominantly sigmoid diverticulosis. Small internal hemorrhoids.   Marland Kitchen HEMORROIDECTOMY  02/2019  . NECK SURGERY    . NECK SURGERY    . RIGHT/LEFT HEART CATH AND CORONARY ANGIOGRAPHY N/A 07/27/2020  Procedure: RIGHT/LEFT HEART CATH AND CORONARY ANGIOGRAPHY;  Surgeon: Sherren Mocha, MD;  Location: [facility];  Service: Cardiovascular;  Laterality: N/A;  . torn rotator cuff Right    Right shoulder  . torn rotator cuff Left    left shoulder  . TRIGGER FINGER RELEASE Left    thumb  . TRIGGER FINGER RELEASE Right    thumb and middle finger  . UPPER GASTROINTESTINAL ENDOSCOPY    . WRIST SURGERY Left     torn ligament  . WRIST SURGERY Right    cyst    Family History  Problem Relation Age of Onset  . Colon cancer Paternal Grandmother   . Esophageal cancer Neg Hx   . Rectal cancer Neg Hx   . Stomach cancer Neg Hx     Social History   Socioeconomic History  . Marital status: Married    Spouse name: Not on file  . Number of children: 2  . Years of education: Not on file  . Highest education level: Not on file  Occupational History  . Not on file  Tobacco Use  . Smoking status: Never Smoker  . Smokeless tobacco: Never Used  Vaping Use  . Vaping Use: Never used  Substance and Sexual Activity  . Alcohol use: Never  . Drug use: Never  . Sexual activity: Not on file  Other Topics Concern  . Not on file  Social History Narrative  . Not on file   Social Determinants of Health   Financial Resource Strain: Not on file  Food Insecurity: No Food Insecurity  . Worried About Charity fundraiser in the Last Year: Never true  . Ran Out of Food in the Last Year: Never true  Transportation Needs: No Transportation Needs  . Lack of Transportation (Medical): No  . Lack of Transportation (Non-Medical): No  Physical Activity: Not on file  Stress: Not on file  Social Connections: Not on file  Intimate Partner Violence: Not on file    Current Outpatient Medications  Medication Sig Dispense Refill  . Accu-Chek Softclix Lancets lancets Use as instructed 100 each 12  . acetaminophen (TYLENOL) 500 MG tablet Take 1,000 mg by mouth every 6 (six) hours as needed for moderate pain or headache.    Marland Kitchen aspirin EC 81 MG tablet Take 81 mg by mouth daily.    . Calcium Carb-Cholecalciferol (CALCIUM 600 + D PO) Take 2 tablets by mouth daily.    . cetirizine (ZYRTEC) 10 MG tablet Take 10 mg by mouth daily.    . chlorthalidone (HYGROTON) 50 MG tablet Take 1 tablet (50 mg total) by mouth daily. 90 tablet 1  . conjugated estrogens (PREMARIN) vaginal cream Place 1 Applicatorful vaginally 2 (two) times a  week. 42.5 g 2  . famotidine (PEPCID) 40 MG tablet TAKE 1 TABLET BY MOUTH AT BEDTIME (Megan Galvan taking differently: Take 40 mg by mouth at bedtime.) 90 tablet 3  . fluticasone (FLONASE) 50 MCG/ACT nasal spray Place 1 spray into the nose daily as needed for allergies.    Marland Kitchen glucose blood (ACCU-CHEK AVIVA PLUS) test strip CHECK BLOOD SUGAR ONCE  DAILY 100 strip 3  . hydrocortisone (ANUSOL-HC) 25 MG suppository Place 1 suppository (25 mg total) rectally 2 (two) times daily. (Megan Galvan taking differently: Place 25 mg rectally 2 (two) times daily as needed for hemorrhoids.) 14 suppository 2  . Ketotifen Fumarate (ALLERGY EYE DROPS OP) Place 1 drop into both eyes daily as needed (allergies).    . Melatonin  5 MG CAPS Take 5 mg by mouth at bedtime.    . metoprolol succinate (TOPROL-XL) 50 MG 24 hr tablet TAKE ONE-HALF TABLET BY  MOUTH DAILY (Megan Galvan taking differently: Take 25 mg by mouth daily.) 45 tablet 3  . Multiple Vitamin (MULTIVITAMIN WITH MINERALS) TABS tablet Take 1 tablet by mouth daily.    Marland Kitchen omeprazole (PRILOSEC) 40 MG capsule Take 1 capsule (40 mg total) by mouth daily. 90 capsule 1  . potassium chloride SA (KLOR-CON) 20 MEQ tablet 2 in am and 1 at night. (Megan Galvan taking differently: Take 20-40 mEq by mouth See admin instructions. Take 40 meq in the morning and 20 meq at night) 270 tablet 1  . pravastatin (PRAVACHOL) 40 MG tablet Take 1 tablet (40 mg total) by mouth at bedtime. 90 tablet 1  . PRESCRIPTION MEDICATION Apply 1 application topically 4 (four) times daily as needed (hemorrhoid). Nifedipine 5% ointment  Rx is sent to [facility]. Last called in on 07/19/2020 with 1 yr of refills.    . traZODone (DESYREL) 100 MG tablet TAKE 1 TABLET BY MOUTH  BEFORE BEDTIME (Megan Galvan taking differently: Take 100 mg by mouth at bedtime.) 90 tablet 1  . valsartan (DIOVAN) 320 MG tablet Take 1 tablet (320 mg total) by mouth daily. 90 tablet 1   No current facility-administered medications for this visit.     Allergies  Allergen Reactions  . Ace Inhibitors Cough  . Keflex [Cephalexin] Itching  . Latex     Burn and itch  . Nexium [Esomeprazole Magnesium] Diarrhea  . Protonix [Pantoprazole Sodium] Other (See Comments)    Constipation  . Doxycycline Rash      Review of Systems:   General:  normal appetite, decreased energy, no weight gain, no weight loss, no fever  Cardiac:  + chest pain with exertion, + chest pain at rest, + SOB with exertion, + resting SOB, no PND, + orthopnea, no palpitations, no arrhythmia, no atrial fibrillation, no LE edema, + dizzy spells, no syncope  Respiratory:  + shortness of breath, no home oxygen, no productive cough, + dry cough, no bronchitis, no wheezing, no hemoptysis, no asthma, no pain with inspiration or cough, + sleep apnea, + CPAP at night  GI:   + difficulty swallowing, + reflux, no frequent heartburn, no hiatal hernia, no abdominal pain, + constipation, + occasional diarrhea, + hematochezia due to hemorrhoids, no hematemesis, no melena  GU:   no dysuria,  no frequency, no urinary tract infection, no hematuria, no kidney stones, no kidney disease  Vascular:  no pain suggestive of claudication, no pain in feet, no leg cramps, no varicose veins, no DVT, no non-healing foot ulcer  Neuro:   no stroke, no TIA's, no seizures, no headaches, no temporary blindness one eye,  no slurred speech, no peripheral neuropathy, no chronic pain, mild instability of gait, mild memory/cognitive dysfunction  Musculoskeletal: + arthritis, no joint swelling, no myalgias, no difficulty walking, normal mobility   Skin:   no rash, no itching, no skin infections, no pressure sores or ulcerations  Psych:   no anxiety, + depression, no nervousness, no unusual recent stress  Eyes:   no blurry vision, no floaters, + recent vision changes, + wears glasses or contacts  ENT:   no hearing loss, no loose or painful teeth, no dentures, last saw dentist February 2022  Hematologic:  no easy  bruising, no abnormal bleeding, no clotting disorder, no frequent epistaxis  Endocrine:  + diabetes, does check CBG's at  home           Physical Exam:   BP (!) 152/76 (BP Location: Right Arm, Megan Galvan Position: Sitting)   Pulse 60   Resp 20   Ht 5\' 6"  (1.676 m)   Wt 207 lb (93.9 kg)   SpO2 95% Comment: RA  BMI 33.41 kg/m   General:  Moderately obese,  well-appearing  HEENT:  Unremarkable   Neck:   no JVD, no bruits, no adenopathy   Chest:   clear to auscultation, symmetrical breath sounds, no wheezes, no rhonchi   CV:   RRR, grade III/VI crescendo/decrescendo murmur heard best at RSB,  no diastolic murmur  Abdomen:  soft, non-tender, no masses   Extremities:  warm, well-perfused, pulses palpable, no LE edema  Rectal/GU  Deferred  Neuro:   Grossly non-focal and symmetrical throughout  Skin:   Clean and dry, no rashes, no breakdown   Diagnostic Tests:  ECHOCARDIOGRAM:                 [place]                Cardiac Ultrasound-                [place]                [address]                [place] [address]         Transthoracic Echocardiogram Report Name: Megan Galvan, Megan Galvan        Study Date: 07/18/2020   Height: 65 in MRN: [id number]                           Weight: 209 lb DOB: 14-May-1950            Gender: Female       BSA: 2.0 m2 Age: 41 yrs              Ethnicity: W        BP: 127/58 mmHg Reason For Study: Chest pain; Chest pain, unspecified type    HR: 61 History: Chest Pain Ordering Physician: Ferol Luz  Performed By: Lynn Ito Referring Physician: SCOTT, [place] Image Quality: Technically  adequate. - SUMMARY Left ventricular systolic function is normal. LV ejection fraction = 60-65%. There is moderate concentric left ventricular hypertrophy. The left atrium is mildly to moderately dilated. There is severe aortic stenosis. There is mild aortic regurgitation. Aortic valve mean pressure gradient is 67 mmHg. There is mild mitral regurgitation. There is no pericardial effusion. There is no comparison study available.  - FINDINGS: LEFT VENTRICLE The left ventricular size is normal. There is moderate concentric left ventricular hypertrophy. Left ventricular systolic function is normal. LV ejection fraction = 60-65%. Mitral inflow deceleration timeNormal>150 msec. The left ventricular wall motion is normal.  - RIGHT VENTRICLE The right ventricle is normal in size and function.  LEFT ATRIUM The left atrium is mildly to moderately dilated.  RIGHT ATRIUM Right atrial size is normal. - AORTIC VALVE  Diffuse calcification of the aortic valve. There is severe aortic stenosis. There is mild aortic regurgitation. Aortic valve peak pressure gradient is 110 mmHg. Aortic valve mean pressure gradient is 67 mmHg. The calculated aortic valve area using the continuity equation is 0.45 cm2. - MITRAL VALVE There is trivial mitral valve thickening. There is mild mitral regurgitation. - TRICUSPID VALVE Structurally normal tricuspid valve. There was insufficient TR detected to calculate RV systolic pressure. - PULMONIC VALVE Structurally normal pulmonic valve. Trace pulmonic valvular regurgitation. - ARTERIES The aortic sinus is normal size. The ascending aorta is normal size. - VENOUS IVC size was normal. - EFFUSION There is no pericardial effusion. Pericardial fat pad is noted. There is no pleural effusion. - -  MMode/2D Measurements & Calculations IVSd: 1.4 cm    LA dim: 3.9 cm   ESV(MOD-sp4):LVOT diam: 1.8 cm LVIDd: 4.8 cm   EDV(MOD-sp4):   14.6  ml LVPWd: 1.2 cm   56.8 ml      EDV(MOD-sp2): LVIDs: 3.3 cm             71.9 ml                   ESV(MOD-sp2):                   17.6 ml    _______________________________________________________________________ SV(MOD-sp4):    EF A4C: 74.5 %   LA ESV (BP): LA ESV Index (A2C): 42.2 ml                84.8 ml   49.4 ml/m2 SI(MOD-sp4): 20.9 ml/m2    _______________________________________________________________________ LA ESV Index (A4C):LA ESV Index (BP): SV A4C: 33.1 ml/m2     42.0 ml/m2     42.3 ml  Doppler Measurements & Calculations MV E max vel:    MV V2 max:    MV P1/2t max JXB:JY(NWGN): 95.4 cm/sec     129.0 cm/sec   133.0 cm/sec   70.2 ml Med Peak E' Vel:  MV max PG:    MV P1/2t:    Ao V2 max: 6.7 cm/sec     6.7 mmHg     99.5 msec    550.4 cm/sec Lat Peak E' Vel:  MV V2 mean:   MV dec time:   Ao max PG: 4.7 cm/sec     78.1 cm/sec   0.18 sec     121.2 mmHg E/Lat E`: 20.3   MV mean PG:            Ao V2 mean: E/Med E`: 14.3   3.0 mmHg             380.0 cm/sec          MV V2 VTI:            Ao mean PG:          53.3 cm              67.0 mmHg          MV area (1 diam):         Ao V2 VTI:                           164.0 cm          10.2 cm2             AVA (VTI):          MVA(P1/2t):  2.2 cm2              0.43 cm2           MVA(VTI): 1.3 cm2    _______________________________________________________________________ AI max vel:     LV V1 VTI:    TR max vel:   RAP systole: 343.0 cm/sec    29.2 cm     197.0 cm/sec   3.0 mmHg AI dec slope:            TR max PG:                   15.5  mmHg 229.5 cm/sec2            RVSP(TR): AI P1/2t:              18.5 mmHg 437.7 msec    _______________________________________________________________________ AS Dimensionless  AVAi(VTI)    SV index(LVOT): Index (VTI): 0.18  cm^2/m^2:    34.8 ml/m2          0.21 cm2  _______________________________________________________________________ Reading Physician:         MD Clarene Critchley, [phone] 07/18/2020 05:03 PM    EKG: NSR w/out acute ischemic changes or AV conduction delay (07/26/2020)    RIGHT/LEFT HEART CATH AND CORONARY ANGIOGRAPHY    Conclusion  1. Severe calcific aortic stenosis with mean gradient 67 mmHg 2. Patent coronary arteries (right dominant) with minimal irregularities but no significant stenoses 3. Essentially normal right heart pressures with the exception of mild pulmonary HTN (mean PAP 27 mmHg)  Recommend: Continued multidisciplinary evaluation for treatment of severe symptomatic aortic stenosis.     Diagnostic Dominance: Right  Left Main  Vessel is angiographically normal.  Left Anterior Descending  The vessel exhibits minimal luminal irregularities.  Left Circumflex  The vessel exhibits minimal luminal irregularities.  Right Coronary Artery  The vessel exhibits minimal luminal irregularities.   Intervention   No interventions have been documented.  Left Heart  Aortic Valve There is severe aortic valve stenosis. The aortic valve is calcified. There is restricted aortic valve motion. Mean gradient 67 mmHg, peak-to-peak gradient 92 mmHg, AVA 0.93 square cm   Coronary Diagrams   Diagnostic Dominance: Right    Intervention    Implants    No implant documentation for this case.    Syngo Images  Show images for CARDIAC CATHETERIZATION  Images on Long Term Storage  Show images for [place], Avants to Procedure Log  Procedure Log     Hemo Data  Flowsheet  Row Most Recent Value  Fick Cardiac Output 8.23 L/min  Fick Cardiac Output Index 4.01 (L/min)/BSA  Aortic Mean Gradient 66.68 mmHg  Aortic Peak Gradient 92 mmHg  Aortic Valve Area 0.93  Aortic Value Area Index 0.45 cm2/BSA  RA A Wave 10 mmHg  RA V Wave 8 mmHg  RA Mean 6 mmHg  RV Systolic Pressure 43 mmHg  RV Diastolic Pressure 3 mmHg  RV EDP 9 mmHg  PA Systolic Pressure 41 mmHg  PA Diastolic Pressure 17 mmHg  PA Mean 27 mmHg  PW A Wave 20 mmHg  PW V Wave 25 mmHg  PW Mean 17 mmHg  AO Systolic Pressure 628 mmHg  AO Diastolic Pressure 53 mmHg  AO Mean 77 mmHg  LV Systolic Pressure 315 mmHg  LV Diastolic Pressure 9 mmHg  LV EDP 24 mmHg  AOp Systolic Pressure 176 mmHg  AOp Diastolic Pressure 56 mmHg  AOp Mean Pressure 84 mmHg  LVp Systolic Pressure 160  mmHg  LVp Diastolic Pressure 10 mmHg  LVp EDP Pressure 22 mmHg  QP/QS 1  TPVR Index 6.74 HRUI  TSVR Index 20.21 HRUI  PVR SVR Ratio 0.13  TPVR/TSVR Ratio 0.33     Cardiac TAVR CT  TECHNIQUE: The Megan Galvan was scanned on a [employer]. A 110 kV retrospective scan was triggered in the descending thoracic aorta at 111 HU's. Gantry rotation speed was 250 msecs and collimation was .6 mm. No beta blockade or nitro were given. The 3D data set was reconstructed in 5% intervals of the R-R cycle. Systolic and diastolic phases were analyzed on a dedicated work station using MPR, MIP and VRT modes. The Megan Galvan received 80 cc of contrast.  FINDINGS: Image quality: Excellent.  Noise artifact is: Limited.  Valve Morphology: The aortic valve is bicuspid with fusion of the RCC/LCC with raphe [place] type 1). The leaflets demonstrate restricted movement in systole. The leaflets are severely calcified with bulky calcification of the raphe and NCC.  Aortic Valve Calcium score: 2167  Aortic annular dimension:  Phase assessed: 10%  Annular area: 512 mm2  Annular perimeter: 82.5 mm  Max diameter: 30.1  mm  Min diameter: 22.0 mm  Annular and subannular calcification: None.  Optimal coplanar projection: LAO 6 CAU 5  Coronary Artery Height above Annulus:  Left Main: 12.2 mm  Right Coronary: 18.8 mm  Sinus of Valsalva Measurements:  Non-coronary: 33 mm  Right-coronary: 31 mm  Left-coronary: 31 mm  Sinus of Valsalva Height:  Non-coronary: 22.9 mm  Right-coronary: 18.8 mm  Left-coronary: 17.6 mm  Sinotubular Junction: 26 mm  Ascending Thoracic Aorta: 28 mm  Coronary Arteries: Normal coronary origin. Right dominance. The study was performed without use of NTG and is insufficient for plaque evaluation. Please refer to recent cardiac catheterization for coronary assessment. No significant CAD.  Cardiac Morphology:  Right Atrium: Right atrial size is within normal limits.  Right Ventricle: The right ventricular cavity is within normal limits.  Left Atrium: Left atrial size is normal in size with no left atrial appendage filling defect. A small PFO is present.  Left Ventricle: The ventricular cavity size is within normal limits. There are no stigmata of prior infarction. There is no abnormal filling defect. Normal LV function, LVEF=69%.  Pulmonary arteries: Normal in size without proximal filling defect.  Pulmonary veins: Normal pulmonary venous drainage.  Pericardium: Normal thickness with no significant effusion or calcium present.  Mitral Valve: The mitral valve is normal structure without significant calcification.  Extra-cardiac findings: See attached radiology report for non-cardiac structures.  IMPRESSION: 1. 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 Severe aortic stenosis (calcium score 2167). Severe bulky calcifications of the NCC and raphe.  2. Annular measurements appropriate for 26 mm S3 TAVR (512 mm2).  3. No significant annular or subannular calcifications.  4. Sufficient coronary to  annulus distance.  5. Optimal Fluoroscopic Angle for Delivery: LAO 6 CAU 5  6. Small PFO.  [place] T. Audie Box, MD   Electronically Signed   By: Eleonore Chiquito   On: 07/31/2020 16:21    CT ANGIOGRAPHY CHEST, ABDOMEN AND PELVIS  TECHNIQUE: Multidetector CT imaging through the chest, abdomen and pelvis was performed using the standard protocol during bolus administration of intravenous contrast. Multiplanar reconstructed images and MIPs were obtained and reviewed to evaluate the vascular anatomy.  CONTRAST:  153mL OMNIPAQUE IOHEXOL 350 MG/ML SOLN  COMPARISON:  No priors.  FINDINGS: CTA CHEST FINDINGS  Cardiovascular: Heart size is normal.  There is no significant pericardial fluid, thickening or pericardial calcification. Aortic atherosclerosis. No definite coronary artery calcifications. Severe thickening and calcification of the aortic valve.  Mediastinum/Lymph Nodes: No pathologically enlarged mediastinal or hilar lymph nodes. Esophagus is unremarkable in appearance. No axillary lymphadenopathy.  Lungs/Pleura: No suspicious appearing pulmonary nodules or masses are noted. No acute consolidative airspace disease. No pleural effusions.  Musculoskeletal/Soft Tissues: There are no aggressive appearing lytic or blastic lesions noted in the visualized portions of the skeleton.  CTA ABDOMEN AND PELVIS FINDINGS  Hepatobiliary: In segment 7 of the liver, compatible 1.9 cm low-attenuation lesion with a simple cyst. No other suspicious hepatic lesions. No intra or extrahepatic biliary ductal dilatation. Gallbladder is normal in appearance.  Pancreas: No pancreatic mass. No pancreatic ductal dilatation. No pancreatic or peripancreatic fluid collections or inflammatory changes.  Spleen: Unremarkable.  Adrenals/Urinary Tract: 4 mm nonobstructive calculus in the upper pole collecting system of the left kidney. Bilateral kidneys and adrenal glands are  otherwise normal in appearance. No hydroureteronephrosis. Urinary bladder is normal in appearance.  Stomach/Bowel: Normal appearance of the stomach. No pathologic dilatation of small bowel or colon. Several colonic diverticulae are noted, without surrounding inflammatory changes to suggest an acute diverticulitis at this time. Normal appendix.  Vascular/Lymphatic: Aortic atherosclerosis, without evidence of aneurysm or dissection in the abdominal or pelvic vasculature. Vascular findings and measurements pertinent to potential TAVR procedure, as detailed below. No lymphadenopathy noted in the abdomen or pelvis.  Reproductive: Uterus and ovaries are unremarkable in appearance.  Other: No significant volume of ascites.  No pneumoperitoneum.  Musculoskeletal: Status post PLIF from L3-S1, with interbody grafts at L3-L4, L4-L5 and L5-S1. There are no aggressive appearing lytic or blastic lesions noted in the visualized portions of the skeleton.  VASCULAR MEASUREMENTS PERTINENT TO TAVR:  AORTA:  Minimal Aortic Diameter-13 x 12 mm  Severity of Aortic Calcification-severe  RIGHT PELVIS:  Right Common Iliac Artery -  Minimal Diameter-8.8 x 7.9 mm  Tortuosity-mild  Calcification-none  Right External Iliac Artery -  Minimal Diameter-7.0 x 6.7 mm  Tortuosity - mild  Calcification-none  Right Common Femoral Artery -  Minimal Diameter-7.1 x 7.0 mm  Tortuosity - mild  Calcification-minimal  LEFT PELVIS:  Left Common Iliac Artery -  Minimal Diameter-9.5 x 7.7 mm  Tortuosity - mild  Calcification-none  Left External Iliac Artery -  Minimal Diameter-7.4 x 6.4 mm  Tortuosity - mild  Calcification-none  Left Common Femoral Artery -  Minimal Diameter-7.4 x 6.1 mm  Tortuosity - mild  Calcification-none  Review of the MIP images confirms the above findings.  IMPRESSION: 1. Vascular findings and measurements pertinent to  potential TAVR procedure, as detailed above. 2. Severe thickening calcification of the aortic valve, compatible with reported clinical history of severe aortic stenosis. 3.  Aortic Atherosclerosis (ICD10-I70.0). 4. 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 5. Colonic diverticulosis without evidence of acute diverticulitis at this time. 6. Additional incidental findings, as above.   Electronically Signed   By: Vinnie Langton M.D.   On: 07/31/2020 15:07   Impression:  Megan Galvan has stage D1 severe symptomatic aortic stenosis.  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  I have personally reviewed the report from transthoracic echocardiogram performed at [facility].  Images from  the report are not currently available.  By report, peak velocity across the aortic valve measured greater than 5 m/s corresponding to peak and mean transvalvular gradients estimated 110 and 67 mmHg, respectively, corresponding to aortic valve area calculated 0.45 cm by VTI.  Left ventricular function was reported to be normal.  I have personally reviewed results from the Megan Galvan's recent diagnostic cardiac catheterization as well as CT angiography performed earlier today.  Catheterization confirmed the presence of severe aortic stenosis with peak to peak and mean transvalvular gradients measured 92 and 67 mmHg, respectively, corresponding to aortic valve area calculated 0.93 cm.  Pulmonary artery pressures were mildly elevated.  The Megan Galvan had normal coronary artery anatomy with no significant coronary artery disease.  Cardiac gated CT angiogram of the heart performed earlier today confirmed the presence of a  Sievers type I bicuspid aortic valve with findings consistent with aortic stenosis including severe bulky calcification involving the aortic valve and the fused raphae.  CT angiography of the aorta and iliac vessels revealed no sign of aneurysmal enlargement of the aorta and adequate pelvic vascular access to facilitate transcatheter aortic valve replacement via transfemoral approach.  Baseline EKG reveals sinus rhythm with no significant AV conduction delay.   Plan:  The Megan Galvan and her husband were counseled at length regarding treatment alternatives for management of severe symptomatic aortic stenosis. Alternative approaches such as conventional aortic valve replacement, transcatheter aortic valve replacement, and continued medical therapy without intervention were compared and contrasted at length.  The risks associated with conventional surgical aortic valve replacement were discussed in detail, as were expectations for post-operative convalescence, alternative surgical approaches, prosthetic valve choices, and the presence or absence of other concomitant conditions which might require intervention.  Issues specific to transcatheter aortic valve replacement were discussed including questions about long term valve durability, the potential for paravalvular leak, possible increased risk of need for permanent pacemaker placement, the suitability of the Megan Galvan's surgical anatomy, and other potential technical complications related to the procedure itself.  Long-term prognosis without surgical intervention was discussed.  All treatment options were discussed in the context of the Megan Galvan's own specific clinical presentation, past medical history, long-term prognosis and life expectancy.  All of their questions have been addressed.  The Megan Galvan desires to proceed with aortic valve replacement using conventional surgical techniques as soon as possible.  We tentatively plan for surgery on August 10, 2020.  The  Megan Galvan understands and accepts all potential associated risks of surgery including but not limited to risk of death, stroke, myocardial infarction, congestive heart failure, respiratory failure, renal failure, pneumonia, bleeding requiring blood transfusion and or reexploration, arrhythmia, heart block or bradycardia requiring permanent pacemaker, aortic dissection or other major vascular complication, pleural effusions or other delayed complications related to continued congestive heart failure, and other late complications related to valve replacement including structural valve deterioration and failure, thrombosis, endocarditis, or paravalvular leak.  All questions have been answered.    I spent in excess of 90 minutes during the conduct of this office consultation and >50% of this time involved direct face-to-face encounter with the Megan Galvan for counseling and/or coordination of their care.      Valentina Gu. Roxy Manns, MD 07/31/2020 4:08 PM

## 2020-07-31 NOTE — Progress Notes (Signed)
HEART AND [place] SURGERY CONSULTATION REPORT  Referring Provider is Bettina Gavia, Hilton Cork, MD PCP is Cox, Elnita Maxwell, MD  Chief Complaint  Patient presents with  . 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  Patient states that she has been told that she had a heart murmur all of her life.  She has never been formally evaluated by cardiologist until recently.  She describes a 1 year history of progressive symptoms of exertional shortness of breath and chest tightness which has gotten much worse over the past 4 to 6 weeks.  She now gets short of breath and tightness across her chest with very low level activity and occasionally at rest.  She has had episodes of nocturnal shortness of breath and she cannot lay flat in bed.  She has had some mild dizzy spells without syncope.  She was evaluated by her primary care physician and underwent a transthoracic echocardiogram at [facility] which revealed findings suggestive of bicuspid aortic valve with severe aortic stenosis.  She was evaluated by Dr. Bettina Gavia and subsequently referred to our multidisciplinary heart valve clinic.  She underwent diagnostic cardiac catheterization by Dr. Burt Knack on July 27, 2020 confirming the presence of severe aortic stenosis.  The patient was noted to have no significant coronary artery disease and mildly elevated right heart pressures.  CT angiography was performed earlier today and the patient referred for surgical consultation.  Patient is married and lives in [place] with her husband and one of her adult sons.  She  has a second adult son and 2 grandchildren.  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  In retirement she has remained functionally independent although she does not exercise or walk on a regular basis.  She has had some problems with both cervical and lumbar degenerative disc disease but she still gets around fairly well without significant physical limitations.  She describes a 1 year history of progressive symptoms of exertional shortness of breath which is gotten particularly severe over the past 4 to 6 weeks as discussed previously.  She otherwise has no significant functional limitations.  Past Medical History:  Diagnosis Date  . Allergy   . Anemia   . Arthritis   . Bone spur of acromioclavicular joint, left   . Cataract   . Diabetes mellitus without complication ([place])   . GERD (gastroesophageal reflux disease)   . Hyperlipidemia   . Hypertension   . Nonrheumatic aortic (valve) stenosis   . Plantar fasciitis   . Sleep apnea    cpap    Past Surgical History:  Procedure Laterality Date  . BACK SURGERY  11/17/2016   L3-L5  . BILATERAL CARPAL TUNNEL RELEASE    . bone spur Left    left shoulder  . bone spur Right    Right shoulder  . COLONOSCOPY  08/22/2011   Moderate predominantly sigmoid diverticulosis. Small internal hemorrhoids.   Marland Kitchen HEMORROIDECTOMY  02/2019  . NECK SURGERY    . NECK SURGERY    . RIGHT/LEFT HEART CATH AND CORONARY ANGIOGRAPHY N/A 07/27/2020  Procedure: RIGHT/LEFT HEART CATH AND CORONARY ANGIOGRAPHY;  Surgeon: Sherren Mocha, MD;  Location: [facility];  Service: Cardiovascular;  Laterality: N/A;  . torn rotator cuff Right    Right shoulder  . torn rotator cuff Left    left shoulder  . TRIGGER FINGER RELEASE Left    thumb  . TRIGGER FINGER RELEASE Right    thumb and middle finger  . UPPER GASTROINTESTINAL ENDOSCOPY    . WRIST SURGERY Left     torn ligament  . WRIST SURGERY Right    cyst    Family History  Problem Relation Age of Onset  . Colon cancer Paternal Grandmother   . Esophageal cancer Neg Hx   . Rectal cancer Neg Hx   . Stomach cancer Neg Hx     Social History   Socioeconomic History  . Marital status: Married    Spouse name: Not on file  . Number of children: 2  . Years of education: Not on file  . Highest education level: Not on file  Occupational History  . Not on file  Tobacco Use  . Smoking status: Never Smoker  . Smokeless tobacco: Never Used  Vaping Use  . Vaping Use: Never used  Substance and Sexual Activity  . Alcohol use: Never  . Drug use: Never  . Sexual activity: Not on file  Other Topics Concern  . Not on file  Social History Narrative  . Not on file   Social Determinants of Health   Financial Resource Strain: Not on file  Food Insecurity: No Food Insecurity  . Worried About Charity fundraiser in the Last Year: Never true  . Ran Out of Food in the Last Year: Never true  Transportation Needs: No Transportation Needs  . Lack of Transportation (Medical): No  . Lack of Transportation (Non-Medical): No  Physical Activity: Not on file  Stress: Not on file  Social Connections: Not on file  Intimate Partner Violence: Not on file    Current Outpatient Medications  Medication Sig Dispense Refill  . Accu-Chek Softclix Lancets lancets Use as instructed 100 each 12  . acetaminophen (TYLENOL) 500 MG tablet Take 1,000 mg by mouth every 6 (six) hours as needed for moderate pain or headache.    Marland Kitchen aspirin EC 81 MG tablet Take 81 mg by mouth daily.    . Calcium Carb-Cholecalciferol (CALCIUM 600 + D PO) Take 2 tablets by mouth daily.    . cetirizine (ZYRTEC) 10 MG tablet Take 10 mg by mouth daily.    . chlorthalidone (HYGROTON) 50 MG tablet Take 1 tablet (50 mg total) by mouth daily. 90 tablet 1  . conjugated estrogens (PREMARIN) vaginal cream Place 1 Applicatorful vaginally 2 (two) times a  week. 42.5 g 2  . famotidine (PEPCID) 40 MG tablet TAKE 1 TABLET BY MOUTH AT BEDTIME (Patient taking differently: Take 40 mg by mouth at bedtime.) 90 tablet 3  . fluticasone (FLONASE) 50 MCG/ACT nasal spray Place 1 spray into the nose daily as needed for allergies.    Marland Kitchen glucose blood (ACCU-CHEK AVIVA PLUS) test strip CHECK BLOOD SUGAR ONCE  DAILY 100 strip 3  . hydrocortisone (ANUSOL-HC) 25 MG suppository Place 1 suppository (25 mg total) rectally 2 (two) times daily. (Patient taking differently: Place 25 mg rectally 2 (two) times daily as needed for hemorrhoids.) 14 suppository 2  . Ketotifen Fumarate (ALLERGY EYE DROPS OP) Place 1 drop into both eyes daily as needed (allergies).    . Melatonin  5 MG CAPS Take 5 mg by mouth at bedtime.    . metoprolol succinate (TOPROL-XL) 50 MG 24 hr tablet TAKE ONE-HALF TABLET BY  MOUTH DAILY (Patient taking differently: Take 25 mg by mouth daily.) 45 tablet 3  . Multiple Vitamin (MULTIVITAMIN WITH MINERALS) TABS tablet Take 1 tablet by mouth daily.    Marland Kitchen omeprazole (PRILOSEC) 40 MG capsule Take 1 capsule (40 mg total) by mouth daily. 90 capsule 1  . potassium chloride SA (KLOR-CON) 20 MEQ tablet 2 in am and 1 at night. (Patient taking differently: Take 20-40 mEq by mouth See admin instructions. Take 40 meq in the morning and 20 meq at night) 270 tablet 1  . pravastatin (PRAVACHOL) 40 MG tablet Take 1 tablet (40 mg total) by mouth at bedtime. 90 tablet 1  . PRESCRIPTION MEDICATION Apply 1 application topically 4 (four) times daily as needed (hemorrhoid). Nifedipine 5% ointment  Rx is sent to [facility]. Last called in on 07/19/2020 with 1 yr of refills.    . traZODone (DESYREL) 100 MG tablet TAKE 1 TABLET BY MOUTH  BEFORE BEDTIME (Patient taking differently: Take 100 mg by mouth at bedtime.) 90 tablet 1  . valsartan (DIOVAN) 320 MG tablet Take 1 tablet (320 mg total) by mouth daily. 90 tablet 1   No current facility-administered medications for this visit.     Allergies  Allergen Reactions  . Ace Inhibitors Cough  . Keflex [Cephalexin] Itching  . Latex     Burn and itch  . Nexium [Esomeprazole Magnesium] Diarrhea  . Protonix [Pantoprazole Sodium] Other (See Comments)    Constipation  . Doxycycline Rash      Review of Systems:   General:  normal appetite, decreased energy, no weight gain, no weight loss, no fever  Cardiac:  + chest pain with exertion, + chest pain at rest, + SOB with exertion, + resting SOB, no PND, + orthopnea, no palpitations, no arrhythmia, no atrial fibrillation, no LE edema, + dizzy spells, no syncope  Respiratory:  + shortness of breath, no home oxygen, no productive cough, + dry cough, no bronchitis, no wheezing, no hemoptysis, no asthma, no pain with inspiration or cough, + sleep apnea, + CPAP at night  GI:   + difficulty swallowing, + reflux, no frequent heartburn, no hiatal hernia, no abdominal pain, + constipation, + occasional diarrhea, + hematochezia due to hemorrhoids, no hematemesis, no melena  GU:   no dysuria,  no frequency, no urinary tract infection, no hematuria, no kidney stones, no kidney disease  Vascular:  no pain suggestive of claudication, no pain in feet, no leg cramps, no varicose veins, no DVT, no non-healing foot ulcer  Neuro:   no stroke, no TIA's, no seizures, no headaches, no temporary blindness one eye,  no slurred speech, no peripheral neuropathy, no chronic pain, mild instability of gait, mild memory/cognitive dysfunction  Musculoskeletal: + arthritis, no joint swelling, no myalgias, no difficulty walking, normal mobility   Skin:   no rash, no itching, no skin infections, no pressure sores or ulcerations  Psych:   no anxiety, + depression, no nervousness, no unusual recent stress  Eyes:   no blurry vision, no floaters, + recent vision changes, + wears glasses or contacts  ENT:   no hearing loss, no loose or painful teeth, no dentures, last saw dentist February 2022  Hematologic:  no easy  bruising, no abnormal bleeding, no clotting disorder, no frequent epistaxis  Endocrine:  + diabetes, does check CBG's at  home           Physical Exam:   BP (!) 152/76 (BP Location: Right Arm, Patient Position: Sitting)   Pulse 60   Resp 20   Ht 5\' 6"  (1.676 m)   Wt 207 lb (93.9 kg)   SpO2 95% Comment: RA  BMI 33.41 kg/m   General:  Moderately obese,  well-appearing  HEENT:  Unremarkable   Neck:   no JVD, no bruits, no adenopathy   Chest:   clear to auscultation, symmetrical breath sounds, no wheezes, no rhonchi   CV:   RRR, grade III/VI crescendo/decrescendo murmur heard best at RSB,  no diastolic murmur  Abdomen:  soft, non-tender, no masses   Extremities:  warm, well-perfused, pulses palpable, no LE edema  Rectal/GU  Deferred  Neuro:   Grossly non-focal and symmetrical throughout  Skin:   Clean and dry, no rashes, no breakdown   Diagnostic Tests:  ECHOCARDIOGRAM:                 [place]                Cardiac Ultrasound-                [place] [facility]                [address]                [place] [address]         Transthoracic Echocardiogram Report Name: JADDA, HUNSUCKER        Study Date: 07/18/2020   Height: 65 in MRN: [id number]                           Weight: 209 lb DOB: 04-14-1951            Gender: Female       BSA: 2.0 m2 Age: 87 yrs              Ethnicity: W        BP: 127/58 mmHg Reason For Study: Chest pain; Chest pain, unspecified type    HR: 61 History: Chest Pain Ordering Physician: Ferol Luz  Performed By: Lynn Ito Referring Physician: SCOTT, [place] Image Quality: Technically  adequate. - SUMMARY Left ventricular systolic function is normal. LV ejection fraction = 60-65%. There is moderate concentric left ventricular hypertrophy. The left atrium is mildly to moderately dilated. There is severe aortic stenosis. There is mild aortic regurgitation. Aortic valve mean pressure gradient is 67 mmHg. There is mild mitral regurgitation. There is no pericardial effusion. There is no comparison study available.  - FINDINGS: LEFT VENTRICLE The left ventricular size is normal. There is moderate concentric left ventricular hypertrophy. Left ventricular systolic function is normal. LV ejection fraction = 60-65%. Mitral inflow deceleration timeNormal>150 msec. The left ventricular wall motion is normal.  - RIGHT VENTRICLE The right ventricle is normal in size and function.  LEFT ATRIUM The left atrium is mildly to moderately dilated.  RIGHT ATRIUM Right atrial size is normal. - AORTIC VALVE  Diffuse calcification of the aortic valve. There is severe aortic stenosis. There is mild aortic regurgitation. Aortic valve peak pressure gradient is 110 mmHg. Aortic valve mean pressure gradient is 67 mmHg. The calculated aortic valve area using the continuity equation is 0.45 cm2. - MITRAL VALVE There is trivial mitral valve thickening. There is mild mitral regurgitation. - TRICUSPID VALVE Structurally normal tricuspid valve. There was insufficient TR detected to calculate RV systolic pressure. - PULMONIC VALVE Structurally normal pulmonic valve. Trace pulmonic valvular regurgitation. - ARTERIES The aortic sinus is normal size. The ascending aorta is normal size. - VENOUS IVC size was normal. - EFFUSION There is no pericardial effusion. Pericardial fat pad is noted. There is no pleural effusion. - -  MMode/2D Measurements & Calculations IVSd: 1.4 cm    LA dim: 3.9 cm   ESV(MOD-sp4):LVOT diam: 1.8 cm LVIDd: 4.8 cm   EDV(MOD-sp4):   14.6  ml LVPWd: 1.2 cm   56.8 ml      EDV(MOD-sp2): LVIDs: 3.3 cm             71.9 ml                   ESV(MOD-sp2):                   17.6 ml    _______________________________________________________________________ SV(MOD-sp4):    EF A4C: 74.5 %   LA ESV (BP): LA ESV Index (A2C): 42.2 ml                84.8 ml   49.4 ml/m2 SI(MOD-sp4): 20.9 ml/m2    _______________________________________________________________________ LA ESV Index (A4C):LA ESV Index (BP): SV A4C: 33.1 ml/m2     42.0 ml/m2     42.3 ml  Doppler Measurements & Calculations MV E max vel:    MV V2 max:    MV P1/2t max DZH:GD(JMEQ): 95.4 cm/sec     129.0 cm/sec   133.0 cm/sec   70.2 ml Med Peak E' Vel:  MV max PG:    MV P1/2t:    Ao V2 max: 6.7 cm/sec     6.7 mmHg     99.5 msec    550.4 cm/sec Lat Peak E' Vel:  MV V2 mean:   MV dec time:   Ao max PG: 4.7 cm/sec     78.1 cm/sec   0.18 sec     121.2 mmHg E/Lat E`: 20.3   MV mean PG:            Ao V2 mean: E/Med E`: 14.3   3.0 mmHg             380.0 cm/sec          MV V2 VTI:            Ao mean PG:          53.3 cm              67.0 mmHg          MV area (1 diam):         Ao V2 VTI:                           164.0 cm          10.2 cm2             AVA (VTI):          MVA(P1/2t):  2.2 cm2              0.43 cm2           MVA(VTI): 1.3 cm2    _______________________________________________________________________ AI max vel:     LV V1 VTI:    TR max vel:   RAP systole: 343.0 cm/sec    29.2 cm     197.0 cm/sec   3.0 mmHg AI dec slope:            TR max PG:                   15.5  mmHg 229.5 cm/sec2            RVSP(TR): AI P1/2t:              18.5 mmHg 437.7 msec    _______________________________________________________________________ AS Dimensionless  AVAi(VTI)    SV index(LVOT): Index (VTI): 0.18  cm^2/m^2:    34.8 ml/m2          0.21 cm2  _______________________________________________________________________ Reading Physician:         MD Clarene Critchley, [phone] 07/18/2020 05:03 PM    EKG: NSR w/out acute ischemic changes or AV conduction delay (07/26/2020)    RIGHT/LEFT HEART CATH AND CORONARY ANGIOGRAPHY    Conclusion  1. Severe calcific aortic stenosis with mean gradient 67 mmHg 2. Patent coronary arteries (right dominant) with minimal irregularities but no significant stenoses 3. Essentially normal right heart pressures with the exception of mild pulmonary HTN (mean PAP 27 mmHg)  Recommend: Continued multidisciplinary evaluation for treatment of severe symptomatic aortic stenosis.     Diagnostic Dominance: Right  Left Main  Vessel is angiographically normal.  Left Anterior Descending  The vessel exhibits minimal luminal irregularities.  Left Circumflex  The vessel exhibits minimal luminal irregularities.  Right Coronary Artery  The vessel exhibits minimal luminal irregularities.   Intervention   No interventions have been documented.  Left Heart  Aortic Valve There is severe aortic valve stenosis. The aortic valve is calcified. There is restricted aortic valve motion. Mean gradient 67 mmHg, peak-to-peak gradient 92 mmHg, AVA 0.93 square cm   Coronary Diagrams   Diagnostic Dominance: Right    Intervention    Implants    No implant documentation for this case.    Syngo Images  Show images for CARDIAC CATHETERIZATION  Images on Long Term Storage  Show images for [place], [place] to Procedure Log  Procedure Log     Hemo Data  Flowsheet  Row Most Recent Value  Fick Cardiac Output 8.23 L/min  Fick Cardiac Output Index 4.01 (L/min)/BSA  Aortic Mean Gradient 66.68 mmHg  Aortic Peak Gradient 92 mmHg  Aortic Valve Area 0.93  Aortic Value Area Index 0.45 cm2/BSA  RA A Wave 10 mmHg  RA V Wave 8 mmHg  RA Mean 6 mmHg  RV Systolic Pressure 43 mmHg  RV Diastolic Pressure 3 mmHg  RV EDP 9 mmHg  PA Systolic Pressure 41 mmHg  PA Diastolic Pressure 17 mmHg  PA Mean 27 mmHg  PW A Wave 20 mmHg  PW V Wave 25 mmHg  PW Mean 17 mmHg  AO Systolic Pressure 025 mmHg  AO Diastolic Pressure 53 mmHg  AO Mean 77 mmHg  LV Systolic Pressure 852 mmHg  LV Diastolic Pressure 9 mmHg  LV EDP 24 mmHg  AOp Systolic Pressure 778 mmHg  AOp Diastolic Pressure 56 mmHg  AOp Mean Pressure 84 mmHg  LVp Systolic Pressure 242  mmHg  LVp Diastolic Pressure 10 mmHg  LVp EDP Pressure 22 mmHg  QP/QS 1  TPVR Index 6.74 HRUI  TSVR Index 20.21 HRUI  PVR SVR Ratio 0.13  TPVR/TSVR Ratio 0.33     Cardiac TAVR CT  TECHNIQUE: The patient was scanned on a [employer]. A 110 kV retrospective scan was triggered in the descending thoracic aorta at 111 HU's. Gantry rotation speed was 250 msecs and collimation was .6 mm. No beta blockade or nitro were given. The 3D data set was reconstructed in 5% intervals of the R-R cycle. Systolic and diastolic phases were analyzed on a dedicated work station using MPR, MIP and VRT modes. The patient received 80 cc of contrast.  FINDINGS: Image quality: Excellent.  Noise artifact is: Limited.  Valve Morphology: The aortic valve is bicuspid with fusion of the RCC/LCC with raphe [place] type 1). The leaflets demonstrate restricted movement in systole. The leaflets are severely calcified with bulky calcification of the raphe and NCC.  Aortic Valve Calcium score: 2167  Aortic annular dimension:  Phase assessed: 10%  Annular area: 512 mm2  Annular perimeter: 82.5 mm  Max diameter: 30.1  mm  Min diameter: 22.0 mm  Annular and subannular calcification: None.  Optimal coplanar projection: LAO 6 CAU 5  Coronary Artery Height above Annulus:  Left Main: 12.2 mm  Right Coronary: 18.8 mm  Sinus of Valsalva Measurements:  Non-coronary: 33 mm  Right-coronary: 31 mm  Left-coronary: 31 mm  Sinus of Valsalva Height:  Non-coronary: 22.9 mm  Right-coronary: 18.8 mm  Left-coronary: 17.6 mm  Sinotubular Junction: 26 mm  Ascending Thoracic Aorta: 28 mm  Coronary Arteries: Normal coronary origin. Right dominance. The study was performed without use of NTG and is insufficient for plaque evaluation. Please refer to recent cardiac catheterization for coronary assessment. No significant CAD.  Cardiac Morphology:  Right Atrium: Right atrial size is within normal limits.  Right Ventricle: The right ventricular cavity is within normal limits.  Left Atrium: Left atrial size is normal in size with no left atrial appendage filling defect. A small PFO is present.  Left Ventricle: The ventricular cavity size is within normal limits. There are no stigmata of prior infarction. There is no abnormal filling defect. Normal LV function, LVEF=69%.  Pulmonary arteries: Normal in size without proximal filling defect.  Pulmonary veins: Normal pulmonary venous drainage.  Pericardium: Normal thickness with no significant effusion or calcium present.  Mitral Valve: The mitral valve is normal structure without significant calcification.  Extra-cardiac findings: See attached radiology report for non-cardiac structures.  IMPRESSION: 1. 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 Severe aortic stenosis (calcium score 2167). Severe bulky calcifications of the NCC and raphe.  2. Annular measurements appropriate for 26 mm S3 TAVR (512 mm2).  3. No significant annular or subannular calcifications.  4. Sufficient coronary to  annulus distance.  5. Optimal Fluoroscopic Angle for Delivery: LAO 6 CAU 5  6. Small PFO.  [place] T. Audie Box, MD   Electronically Signed   By: Eleonore Chiquito   On: 07/31/2020 16:21    CT ANGIOGRAPHY CHEST, ABDOMEN AND PELVIS  TECHNIQUE: Multidetector CT imaging through the chest, abdomen and pelvis was performed using the standard protocol during bolus administration of intravenous contrast. Multiplanar reconstructed images and MIPs were obtained and reviewed to evaluate the vascular anatomy.  CONTRAST:  160mL OMNIPAQUE IOHEXOL 350 MG/ML SOLN  COMPARISON:  No priors.  FINDINGS: CTA CHEST FINDINGS  Cardiovascular: Heart size is normal.  There is no significant pericardial fluid, thickening or pericardial calcification. Aortic atherosclerosis. No definite coronary artery calcifications. Severe thickening and calcification of the aortic valve.  Mediastinum/Lymph Nodes: No pathologically enlarged mediastinal or hilar lymph nodes. Esophagus is unremarkable in appearance. No axillary lymphadenopathy.  Lungs/Pleura: No suspicious appearing pulmonary nodules or masses are noted. No acute consolidative airspace disease. No pleural effusions.  Musculoskeletal/Soft Tissues: There are no aggressive appearing lytic or blastic lesions noted in the visualized portions of the skeleton.  CTA ABDOMEN AND PELVIS FINDINGS  Hepatobiliary: In segment 7 of the liver, compatible 1.9 cm low-attenuation lesion with a simple cyst. No other suspicious hepatic lesions. No intra or extrahepatic biliary ductal dilatation. Gallbladder is normal in appearance.  Pancreas: No pancreatic mass. No pancreatic ductal dilatation. No pancreatic or peripancreatic fluid collections or inflammatory changes.  Spleen: Unremarkable.  Adrenals/Urinary Tract: 4 mm nonobstructive calculus in the upper pole collecting system of the left kidney. Bilateral kidneys and adrenal glands are  otherwise normal in appearance. No hydroureteronephrosis. Urinary bladder is normal in appearance.  Stomach/Bowel: Normal appearance of the stomach. No pathologic dilatation of small bowel or colon. Several colonic diverticulae are noted, without surrounding inflammatory changes to suggest an acute diverticulitis at this time. Normal appendix.  Vascular/Lymphatic: Aortic atherosclerosis, without evidence of aneurysm or dissection in the abdominal or pelvic vasculature. Vascular findings and measurements pertinent to potential TAVR procedure, as detailed below. No lymphadenopathy noted in the abdomen or pelvis.  Reproductive: Uterus and ovaries are unremarkable in appearance.  Other: No significant volume of ascites.  No pneumoperitoneum.  Musculoskeletal: Status post PLIF from L3-S1, with interbody grafts at L3-L4, L4-L5 and L5-S1. There are no aggressive appearing lytic or blastic lesions noted in the visualized portions of the skeleton.  VASCULAR MEASUREMENTS PERTINENT TO TAVR:  AORTA:  Minimal Aortic Diameter-13 x 12 mm  Severity of Aortic Calcification-severe  RIGHT PELVIS:  Right Common Iliac Artery -  Minimal Diameter-8.8 x 7.9 mm  Tortuosity-mild  Calcification-none  Right External Iliac Artery -  Minimal Diameter-7.0 x 6.7 mm  Tortuosity - mild  Calcification-none  Right Common Femoral Artery -  Minimal Diameter-7.1 x 7.0 mm  Tortuosity - mild  Calcification-minimal  LEFT PELVIS:  Left Common Iliac Artery -  Minimal Diameter-9.5 x 7.7 mm  Tortuosity - mild  Calcification-none  Left External Iliac Artery -  Minimal Diameter-7.4 x 6.4 mm  Tortuosity - mild  Calcification-none  Left Common Femoral Artery -  Minimal Diameter-7.4 x 6.1 mm  Tortuosity - mild  Calcification-none  Review of the MIP images confirms the above findings.  IMPRESSION: 1. Vascular findings and measurements pertinent to  potential TAVR procedure, as detailed above. 2. Severe thickening calcification of the aortic valve, compatible with reported clinical history of severe aortic stenosis. 3.  Aortic Atherosclerosis (ICD10-I70.0). 4. 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 5. Colonic diverticulosis without evidence of acute diverticulitis at this time. 6. Additional incidental findings, as above.   Electronically Signed   By: Vinnie Langton M.D.   On: 07/31/2020 15:07   Impression:  Patient has stage D1 severe symptomatic aortic stenosis.  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  I have personally reviewed the report from transthoracic echocardiogram performed at [facility].  Images from  the report are not currently available.  By report, peak velocity across the aortic valve measured greater than 5 m/s corresponding to peak and mean transvalvular gradients estimated 110 and 67 mmHg, respectively, corresponding to aortic valve area calculated 0.45 cm by VTI.  Left ventricular function was reported to be normal.  I have personally reviewed results from the patient's recent diagnostic cardiac catheterization as well as CT angiography performed earlier today.  Catheterization confirmed the presence of severe aortic stenosis with peak to peak and mean transvalvular gradients measured 92 and 67 mmHg, respectively, corresponding to aortic valve area calculated 0.93 cm.  Pulmonary artery pressures were mildly elevated.  The patient had normal coronary artery anatomy with no significant coronary artery disease.  Cardiac gated CT angiogram of the heart performed earlier today confirmed the presence of a  Sievers type I bicuspid aortic valve with findings consistent with aortic stenosis including severe bulky calcification involving the aortic valve and the fused raphae.  CT angiography of the aorta and iliac vessels revealed no sign of aneurysmal enlargement of the aorta and adequate pelvic vascular access to facilitate transcatheter aortic valve replacement via transfemoral approach.  Baseline EKG reveals sinus rhythm with no significant AV conduction delay.   Plan:  The patient and her husband were counseled at length regarding treatment alternatives for management of severe symptomatic aortic stenosis. Alternative approaches such as conventional aortic valve replacement, transcatheter aortic valve replacement, and continued medical therapy without intervention were compared and contrasted at length.  The risks associated with conventional surgical aortic valve replacement were discussed in detail, as were expectations for post-operative convalescence, alternative surgical approaches, prosthetic valve choices, and the presence or absence of other concomitant conditions which might require intervention.  Issues specific to transcatheter aortic valve replacement were discussed including questions about long term valve durability, the potential for paravalvular leak, possible increased risk of need for permanent pacemaker placement, the suitability of the patient's surgical anatomy, and other potential technical complications related to the procedure itself.  Long-term prognosis without surgical intervention was discussed.  All treatment options were discussed in the context of the patient's own specific clinical presentation, past medical history, long-term prognosis and life expectancy.  All of their questions have been addressed.  The patient desires to proceed with aortic valve replacement using conventional surgical techniques as soon as possible.  We tentatively plan for surgery on August 10, 2020.  The  patient understands and accepts all potential associated risks of surgery including but not limited to risk of death, stroke, myocardial infarction, congestive heart failure, respiratory failure, renal failure, pneumonia, bleeding requiring blood transfusion and or reexploration, arrhythmia, heart block or bradycardia requiring permanent pacemaker, aortic dissection or other major vascular complication, pleural effusions or other delayed complications related to continued congestive heart failure, and other late complications related to valve replacement including structural valve deterioration and failure, thrombosis, endocarditis, or paravalvular leak.  All questions have been answered.    I spent in excess of 90 minutes during the conduct of this office consultation and >50% of this time involved direct face-to-face encounter with the patient for counseling and/or coordination of their care.      Valentina Gu. Roxy Manns, MD 07/31/2020 4:08 PM

## 2020-07-31 NOTE — Patient Instructions (Signed)
   Continue taking all current medications without change through the day before surgery.  Make sure to bring all of your medications with you when you come for your Pre-Admission Testing appointment at [facility] Short-Stay Department.  Have nothing to eat or drink after midnight the night before surgery.  On the morning of surgery take only Prilosec with a sip of water.  At your appointment for Pre-Admission Testing at the [facility] Short-Stay Department you will be asked to sign permission forms for your upcoming surgery.  By definition your signature on these forms implies that you and/or your designee provide full informed consent for your planned surgical procedure(s), that alternative treatment options have been discussed, that you understand and accept any and all potential risks, and that you have some understanding of what to expect for your post-operative convalescence.  For any major cardiac surgical procedure potential operative risks include but are not limited to at least some risk of death, stroke or other neurologic complication, myocardial infarction, congestive heart failure, respiratory failure, renal failure, bleeding requiring blood transfusion and/or reexploration, irregular heart rhythm, heart block or bradycardia requiring permanent pacemaker, aortic dissection or other major vascular complication, pneumonia, pericardial effusion, pleural effusion, wound infection, pulmonary embolus or other thromboembolic complication, chronic pain, or other complications related to the specific procedure(s) performed.  Please call to schedule a follow-up appointment in our office prior to surgery if you have any unresolved questions about your planned surgical procedure, the associated risks, alternative treatment options, and/or expectations for your post-operative recovery.

## 2020-07-31 NOTE — Progress Notes (Signed)
Carotid US completed    Please see CV Proc for preliminary results.   Vonzell Schlatter, RVT

## 2020-08-01 ENCOUNTER — Encounter: Payer: Self-pay | Admitting: *Deleted

## 2020-08-02 ENCOUNTER — Other Ambulatory Visit: Payer: Self-pay

## 2020-08-02 ENCOUNTER — Ambulatory Visit
Admission: RE | Admit: 2020-08-02 | Discharge: 2020-08-02 | Disposition: A | Discharge status: Home / Self Care | Payer: Self-pay | Source: Ambulatory Visit | Attending: Cardiology | Admitting: Cardiology

## 2020-08-02 DIAGNOSIS — I350 Nonrheumatic aortic (valve) stenosis: Secondary | ICD-10-CM

## 2020-08-08 ENCOUNTER — Other Ambulatory Visit (HOSPITAL_COMMUNITY)
Admission: RE | Admit: 2020-08-08 | Discharge: 2020-08-08 | Disposition: A | Discharge status: Home / Self Care | Payer: Medicare Other | Source: Ambulatory Visit | Attending: Thoracic Surgery (Cardiothoracic Vascular Surgery) | Admitting: Thoracic Surgery (Cardiothoracic Vascular Surgery)

## 2020-08-08 ENCOUNTER — Ambulatory Visit (HOSPITAL_COMMUNITY)
Admission: RE | Admit: 2020-08-08 | Discharge: 2020-08-08 | Disposition: A | Discharge status: Home / Self Care | Payer: Medicare Other | Source: Ambulatory Visit | Attending: Thoracic Surgery (Cardiothoracic Vascular Surgery) | Admitting: Thoracic Surgery (Cardiothoracic Vascular Surgery)

## 2020-08-08 ENCOUNTER — Other Ambulatory Visit: Payer: Self-pay

## 2020-08-08 ENCOUNTER — Encounter (HOSPITAL_COMMUNITY)
Admission: RE | Admit: 2020-08-08 | Discharge: 2020-08-08 | Disposition: A | Discharge status: Home / Self Care | Payer: Medicare Other | Source: Ambulatory Visit | Attending: Thoracic Surgery (Cardiothoracic Vascular Surgery) | Admitting: Thoracic Surgery (Cardiothoracic Vascular Surgery)

## 2020-08-08 ENCOUNTER — Encounter (HOSPITAL_COMMUNITY): Payer: Self-pay

## 2020-08-08 ENCOUNTER — Ambulatory Visit: Payer: Medicare Other | Admitting: Family Medicine

## 2020-08-08 ENCOUNTER — Ambulatory Visit (HOSPITAL_BASED_OUTPATIENT_CLINIC_OR_DEPARTMENT_OTHER)
Admission: RE | Admit: 2020-08-08 | Discharge: 2020-08-08 | Disposition: A | Discharge status: Home / Self Care | Payer: Medicare Other | Source: Ambulatory Visit | Attending: Thoracic Surgery (Cardiothoracic Vascular Surgery) | Admitting: Thoracic Surgery (Cardiothoracic Vascular Surgery)

## 2020-08-08 ENCOUNTER — Ambulatory Visit (HOSPITAL_COMMUNITY): Payer: Medicare Other

## 2020-08-08 DIAGNOSIS — Z20822 Contact with and (suspected) exposure to covid-19: Secondary | ICD-10-CM | POA: Insufficient documentation

## 2020-08-08 DIAGNOSIS — I4891 Unspecified atrial fibrillation: Secondary | ICD-10-CM | POA: Diagnosis not present

## 2020-08-08 DIAGNOSIS — I5032 Chronic diastolic (congestive) heart failure: Secondary | ICD-10-CM | POA: Diagnosis not present

## 2020-08-08 DIAGNOSIS — Z7982 Long term (current) use of aspirin: Secondary | ICD-10-CM | POA: Diagnosis not present

## 2020-08-08 DIAGNOSIS — I10 Essential (primary) hypertension: Secondary | ICD-10-CM | POA: Diagnosis not present

## 2020-08-08 DIAGNOSIS — D696 Thrombocytopenia, unspecified: Secondary | ICD-10-CM | POA: Diagnosis not present

## 2020-08-08 DIAGNOSIS — Z01818 Encounter for other preprocedural examination: Secondary | ICD-10-CM | POA: Insufficient documentation

## 2020-08-08 DIAGNOSIS — R079 Chest pain, unspecified: Secondary | ICD-10-CM | POA: Diagnosis not present

## 2020-08-08 DIAGNOSIS — Z01812 Encounter for preprocedural laboratory examination: Secondary | ICD-10-CM | POA: Insufficient documentation

## 2020-08-08 DIAGNOSIS — K219 Gastro-esophageal reflux disease without esophagitis: Secondary | ICD-10-CM | POA: Diagnosis not present

## 2020-08-08 DIAGNOSIS — Z79899 Other long term (current) drug therapy: Secondary | ICD-10-CM | POA: Diagnosis not present

## 2020-08-08 DIAGNOSIS — E782 Mixed hyperlipidemia: Secondary | ICD-10-CM | POA: Diagnosis not present

## 2020-08-08 DIAGNOSIS — D62 Acute posthemorrhagic anemia: Secondary | ICD-10-CM | POA: Diagnosis not present

## 2020-08-08 DIAGNOSIS — G4733 Obstructive sleep apnea (adult) (pediatric): Secondary | ICD-10-CM | POA: Diagnosis not present

## 2020-08-08 DIAGNOSIS — I517 Cardiomegaly: Secondary | ICD-10-CM | POA: Diagnosis not present

## 2020-08-08 DIAGNOSIS — Z952 Presence of prosthetic heart valve: Secondary | ICD-10-CM | POA: Diagnosis not present

## 2020-08-08 DIAGNOSIS — J90 Pleural effusion, not elsewhere classified: Secondary | ICD-10-CM | POA: Diagnosis not present

## 2020-08-08 DIAGNOSIS — I350 Nonrheumatic aortic (valve) stenosis: Secondary | ICD-10-CM

## 2020-08-08 DIAGNOSIS — J9811 Atelectasis: Secondary | ICD-10-CM | POA: Diagnosis not present

## 2020-08-08 DIAGNOSIS — I358 Other nonrheumatic aortic valve disorders: Secondary | ICD-10-CM | POA: Diagnosis not present

## 2020-08-08 DIAGNOSIS — M5136 Other intervertebral disc degeneration, lumbar region: Secondary | ICD-10-CM | POA: Diagnosis not present

## 2020-08-08 DIAGNOSIS — E1142 Type 2 diabetes mellitus with diabetic polyneuropathy: Secondary | ICD-10-CM | POA: Diagnosis not present

## 2020-08-08 DIAGNOSIS — I110 Hypertensive heart disease with heart failure: Secondary | ICD-10-CM | POA: Diagnosis not present

## 2020-08-08 DIAGNOSIS — I352 Nonrheumatic aortic (valve) stenosis with insufficiency: Secondary | ICD-10-CM | POA: Diagnosis not present

## 2020-08-08 HISTORY — DX: Headache, unspecified: R51.9

## 2020-08-08 LAB — URINALYSIS, ROUTINE W REFLEX MICROSCOPIC
Bilirubin Urine: NEGATIVE
Glucose, UA: NEGATIVE mg/dL
Hgb urine dipstick: NEGATIVE
Ketones, ur: NEGATIVE mg/dL
Leukocytes,Ua: NEGATIVE
Nitrite: NEGATIVE
Protein, ur: NEGATIVE mg/dL
Specific Gravity, Urine: 1.006 (ref 1.005–1.030)
pH: 6 (ref 5.0–8.0)

## 2020-08-08 LAB — APTT: aPTT: 30 seconds (ref 24–36)

## 2020-08-08 LAB — PROTIME-INR
INR: 1 (ref 0.8–1.2)
Prothrombin Time: 13.6 seconds (ref 11.4–15.2)

## 2020-08-08 LAB — COMPREHENSIVE METABOLIC PANEL
ALT: 24 U/L (ref 0–44)
AST: 30 U/L (ref 15–41)
Albumin: 3.9 g/dL (ref 3.5–5.0)
Alkaline Phosphatase: 50 U/L (ref 38–126)
Anion gap: 9 (ref 5–15)
BUN: 16 mg/dL (ref 8–23)
CO2: 27 mmol/L (ref 22–32)
Calcium: 9.1 mg/dL (ref 8.9–10.3)
Chloride: 100 mmol/L (ref 98–111)
Creatinine, Ser: 0.91 mg/dL (ref 0.44–1.00)
GFR, Estimated: >60 mL/min (ref >60)
Glucose, Bld: 138 mg/dL — ABNORMAL HIGH (ref 70–99)
Potassium: 3.3 mmol/L — ABNORMAL LOW (ref 3.5–5.1)
Sodium: 136 mmol/L (ref 135–145)
Total Bilirubin: 0.8 mg/dL (ref 0.3–1.2)
Total Protein: 6.8 g/dL (ref 6.5–8.1)

## 2020-08-08 LAB — HEMOGLOBIN A1C
Hgb A1c MFr Bld: 6.5 % — ABNORMAL HIGH (ref 4.8–5.6)
Mean Plasma Glucose: 139.85 mg/dL

## 2020-08-08 LAB — GLUCOSE, CAPILLARY: Glucose-Capillary: 125 mg/dL — ABNORMAL HIGH (ref 70–99)

## 2020-08-08 LAB — CBC
HCT: 35.5 % — ABNORMAL LOW (ref 36.0–46.0)
Hemoglobin: 12 g/dL (ref 12.0–15.0)
MCH: 32.7 pg (ref 26.0–34.0)
MCHC: 33.8 g/dL (ref 30.0–36.0)
MCV: 96.7 fL (ref 80.0–100.0)
Platelets: 211 10*3/uL (ref 150–400)
RBC: 3.67 MIL/uL — ABNORMAL LOW (ref 3.87–5.11)
RDW: 13.5 % (ref 11.5–15.5)
WBC: 6.7 10*3/uL (ref 4.0–10.5)
nRBC: 0 % (ref 0.0–0.2)

## 2020-08-08 LAB — BLOOD GAS, ARTERIAL
Acid-Base Excess: 4.7 mmol/L — ABNORMAL HIGH (ref 0.0–2.0)
Acid-base deficit: 28 mmol/L — ABNORMAL HIGH (ref 0.0–2.0)
Bicarbonate: 28 mmol/L (ref 20.0–28.0)
FIO2: 21
O2 Saturation: 96.6 %
Patient temperature: 37
pCO2 arterial: 48.9 mmHg — ABNORMAL HIGH (ref 32.0–48.0)
pH, Arterial: 7.395 (ref 7.350–7.450)
pO2, Arterial: 92.5 mmHg (ref 83.0–108.0)

## 2020-08-08 LAB — SARS CORONAVIRUS 2 (TAT 6-24 HRS): SARS Coronavirus 2: NEGATIVE

## 2020-08-08 LAB — SURGICAL PCR SCREEN
MRSA, PCR: NEGATIVE
Staphylococcus aureus: NEGATIVE

## 2020-08-08 NOTE — Progress Notes (Signed)
PCP - Dr.Kirsten Cox Cardiologist - Dr. Bettina Gavia  Chest x-ray - today EKG - 07/26/20 ECHO - 07/18/20 (CE) Cardiac Cath - 07/27/20  Sleep Study  ? CPAP - yes  Fasting Blood Sugar - between 120-125, last A1C was 6.8 on 05/09/20 Checks Blood Sugar __1___ times a day  Aspirin Instructions: to continue Aspirin, not to take day of surgery  COVID TEST- today   Anesthesia review: yes, heart and diabetes  Patient denies shortness of breath, fever, cough and chest pain at PAT appointment   All instructions explained to the patient, with a verbal understanding of the material. Patient agrees to go over the instructions while at home for a better understanding. Patient also instructed to self quarantine after being tested for COVID-19. The opportunity to ask questions was provided.

## 2020-08-08 NOTE — Progress Notes (Signed)
Pre-AVR ultrasound study completed.   Please see CV Proc for preliminary results.   Darlin Coco, RDMS, RVT

## 2020-08-08 NOTE — Pre-Procedure Instructions (Signed)
Megan Galvan  08/08/2020    Your procedure is scheduled on Thursday, August 10, 2020 at 7:30 AM.   Report to [facility] Entrance "A" Admitting Office at 5:30 AM.   Call this number if you have problems the morning of surgery: [phone]   Questions prior to day of surgery, please call [phone] between 8 & 4 PM.   Remember:  Do not eat or drink after midnight Wednesday, 08/09/20.  Take these medicines the morning of surgery with A SIP OF WATER: Metoprolol (Toprol SL)  Continue Aspirin, but do not take day of surgery. Stop Vitamins and Melatonin as of today. Do not use NSAIDS (Ibuprofen, Aleve, etc), other Aspirin containing products, other herbal medications or Fish oil prior to surgery.   HOW TO MANAGE YOUR DIABETES BEFORE AND AFTER SURGERY  Why is it important to control my blood sugar before and after surgery? . Improving blood sugar levels before and after surgery helps healing and can limit problems. . A way of improving blood sugar control is eating a healthy diet by: o  Eating less sugar and carbohydrates o  Increasing activity/exercise o  Talking with your doctor about reaching your blood sugar goals . High blood sugars (greater than 180 mg/dL) can raise your risk of infections and slow your recovery, so you will need to focus on controlling your diabetes during the weeks before surgery. . Make sure that the doctor who takes care of your diabetes knows about your planned surgery including the date and location.  How do I manage my blood sugar before surgery? . Check your blood sugar at least 4 times a day, starting 2 days before surgery, to make sure that the level is not too high or low. . Check your blood sugar the morning of your surgery when you wake up and every 2 hours until you get to the Short Stay unit. o If your blood sugar is less than 70 mg/dL, you will need to treat for low blood sugar: - Do not take insulin. - Treat a low blood sugar (less than  70 mg/dL) with  cup of clear juice (cranberry or apple), 4 glucose tablets, OR glucose gel. - Recheck blood sugar in 15 minutes after treatment (to make sure it is greater than 70 mg/dL). If your blood sugar is not greater than 70 mg/dL on recheck, call [phone] for further instructions. . Report your blood sugar to the short stay nurse when you get to Short Stay.  . If you are admitted to the hospital after surgery: o Your blood sugar will be checked by the staff and you will probably be given insulin after surgery (instead of oral diabetes medicines) to make sure you have good blood sugar levels. o The goal for blood sugar control after surgery is 80-180 mg/dL.     Do not wear jewelry, make-up or nail polish.  Do not wear lotions, powders, perfumes or deodorant.  Do not shave 48 hours prior to surgery.    Do not bring valuables to the hospital.  [facility] is not responsible for any belongings or valuables.  Contacts, dentures or bridgework may not be worn into surgery.  Leave your suitcase in the car.  After surgery it may be brought to your room.  For patients admitted to the hospital, discharge time will be determined by your treatment team.  [facility] - Preparing for Surgery  Before surgery, you can play an important role.  Because skin is not sterile,  your skin needs to be as free of germs as possible.  You can reduce the number of germs on you skin by washing with CHG (chlorahexidine gluconate) soap before surgery.  CHG is an antiseptic cleaner which kills germs and bonds with the skin to continue killing germs even after washing.  Oral Hygiene is also important in reducing the risk of infection.  Remember to brush your teeth with your regular toothpaste the morning of surgery.  Please DO NOT use if you have an allergy to CHG or antibacterial soaps.  If your skin becomes reddened/irritated stop using the CHG and inform your nurse when you arrive at Short Stay.  Do not shave  (including legs and underarms) for at least 48 hours prior to the first CHG shower.  You may shave your face.  Please follow these instructions carefully:   1.  Shower with CHG Soap the night before surgery and the morning of Surgery.  2.  If you choose to wash your hair, wash your hair first as usual with your normal shampoo.  3.  After you shampoo, rinse your hair and body thoroughly to remove the shampoo. 4.  Use CHG as you would any other liquid soap.  You can apply chg directly to the skin and wash gently with a      scrungie or washcloth.           5.  Apply the CHG Soap to your body ONLY FROM THE NECK DOWN.   Do not use on open wounds or open sores. Avoid contact with your eyes, ears, mouth and genitals (private parts).  Wash genitals (private parts) with your normal soap, do this prior to using CHG soap.  6.  Wash thoroughly, paying special attention to the area where your surgery will be performed.  7.  Thoroughly rinse your body with warm water from the neck down.  8.  DO NOT shower/wash with your normal soap after using and rinsing off the CHG Soap.  9.  Pat yourself dry with a clean towel.            10.  Wear clean pajamas.            11.  Place clean sheets on your bed the night of your first shower and do not sleep with pets.  Day of Surgery  Shower as above. Do not apply any lotions/deodorants the morning of surgery.   Please wear clean clothes to the hospital. Remember to brush your teeth with toothpaste.  Please read over the fact sheets that you were given.       y

## 2020-08-09 ENCOUNTER — Encounter (HOSPITAL_COMMUNITY): Payer: Self-pay

## 2020-08-09 MED ORDER — DEXMEDETOMIDINE HCL IN NACL 400 MCG/100ML IV SOLN
0.1000 ug/kg/h | INTRAVENOUS | Status: DC
  Filled 2020-08-09: qty 100

## 2020-08-09 MED ORDER — INSULIN REGULAR(HUMAN) IN NACL 100-0.9 UT/100ML-% IV SOLN
INTRAVENOUS | Status: DC
  Filled 2020-08-09: qty 100

## 2020-08-09 MED ORDER — LEVOFLOXACIN IN D5W 500 MG/100ML IV SOLN
500.0000 mg | INTRAVENOUS | Status: AC
  Administered 2020-08-10: 500 mg via INTRAVENOUS
  Filled 2020-08-09: qty 100

## 2020-08-09 MED ORDER — VANCOMYCIN HCL 1000 MG IV SOLR
INTRAVENOUS | Status: DC
  Filled 2020-08-09: qty 1000

## 2020-08-09 MED ORDER — NITROGLYCERIN IN D5W 200-5 MCG/ML-% IV SOLN
2.0000 ug/min | INTRAVENOUS | Status: DC
  Filled 2020-08-09: qty 250

## 2020-08-09 MED ORDER — MILRINONE LACTATE IN DEXTROSE 20-5 MG/100ML-% IV SOLN
0.3000 ug/kg/min | INTRAVENOUS | Status: DC
  Filled 2020-08-09: qty 100

## 2020-08-09 MED ORDER — TRANEXAMIC ACID (OHS) BOLUS VIA INFUSION
15.0000 mg/kg | INTRAVENOUS | Status: AC
  Administered 2020-08-10: 1402.5 mg via INTRAVENOUS
  Filled 2020-08-09: qty 1403

## 2020-08-09 MED ORDER — PLASMA-LYTE 148 IV SOLN
INTRAVENOUS | Status: DC
  Filled 2020-08-09: qty 2.5

## 2020-08-09 MED ORDER — PHENYLEPHRINE HCL-NACL 20-0.9 MG/250ML-% IV SOLN
30.0000 ug/min | INTRAVENOUS | Status: DC
  Filled 2020-08-09: qty 250

## 2020-08-09 MED ORDER — TRANEXAMIC ACID (OHS) PUMP PRIME SOLUTION
2.0000 mg/kg | INTRAVENOUS | Status: DC
  Filled 2020-08-09: qty 1.87

## 2020-08-09 MED ORDER — EPINEPHRINE HCL 5 MG/250ML IV SOLN IN NS
0.0000 ug/min | INTRAVENOUS | Status: DC
  Filled 2020-08-09: qty 250

## 2020-08-09 MED ORDER — NOREPINEPHRINE 4 MG/250ML-% IV SOLN
0.0000 ug/min | INTRAVENOUS | Status: DC
  Filled 2020-08-09: qty 250

## 2020-08-09 MED ORDER — POTASSIUM CHLORIDE 2 MEQ/ML IV SOLN
80.0000 meq | INTRAVENOUS | Status: DC
  Filled 2020-08-09: qty 40

## 2020-08-09 MED ORDER — SODIUM CHLORIDE 0.9 % IV SOLN
INTRAVENOUS | Status: DC
  Filled 2020-08-09: qty 30

## 2020-08-09 MED ORDER — VANCOMYCIN HCL 1500 MG/300ML IV SOLN
1500.0000 mg | INTRAVENOUS | Status: AC
  Administered 2020-08-10: 1500 mg via INTRAVENOUS
  Filled 2020-08-09: qty 300

## 2020-08-09 MED ORDER — TRANEXAMIC ACID 1000 MG/10ML IV SOLN
1.5000 mg/kg/h | INTRAVENOUS | Status: DC
  Filled 2020-08-09: qty 25

## 2020-08-09 MED ORDER — MANNITOL 20 % IV SOLN
Freq: Once | INTRAVENOUS | Status: DC
  Filled 2020-08-09: qty 13

## 2020-08-10 ENCOUNTER — Other Ambulatory Visit: Payer: Self-pay

## 2020-08-10 ENCOUNTER — Encounter (HOSPITAL_COMMUNITY)
Admission: RE | Disposition: A | Discharge status: Home / Self Care | Payer: Self-pay | Source: Home / Self Care | Attending: Thoracic Surgery (Cardiothoracic Vascular Surgery)

## 2020-08-10 ENCOUNTER — Inpatient Hospital Stay (HOSPITAL_COMMUNITY): Payer: Medicare Other | Admitting: Physician Assistant

## 2020-08-10 ENCOUNTER — Inpatient Hospital Stay (HOSPITAL_COMMUNITY): Payer: Medicare Other

## 2020-08-10 ENCOUNTER — Inpatient Hospital Stay (HOSPITAL_COMMUNITY)
Admission: RE | Admit: 2020-08-10 | Discharge: 2020-08-15 | DRG: 220 | Disposition: A | Discharge status: Home / Self Care | Payer: Medicare Other | Attending: Thoracic Surgery (Cardiothoracic Vascular Surgery) | Admitting: Thoracic Surgery (Cardiothoracic Vascular Surgery)

## 2020-08-10 ENCOUNTER — Inpatient Hospital Stay (HOSPITAL_COMMUNITY): Payer: Medicare Other | Admitting: Anesthesiology

## 2020-08-10 ENCOUNTER — Encounter (HOSPITAL_COMMUNITY): Payer: Self-pay | Admitting: Thoracic Surgery (Cardiothoracic Vascular Surgery)

## 2020-08-10 DIAGNOSIS — I350 Nonrheumatic aortic (valve) stenosis: Principal | ICD-10-CM

## 2020-08-10 DIAGNOSIS — Z7982 Long term (current) use of aspirin: Secondary | ICD-10-CM | POA: Diagnosis not present

## 2020-08-10 DIAGNOSIS — D696 Thrombocytopenia, unspecified: Secondary | ICD-10-CM | POA: Diagnosis not present

## 2020-08-10 DIAGNOSIS — Z9889 Other specified postprocedural states: Secondary | ICD-10-CM

## 2020-08-10 DIAGNOSIS — E782 Mixed hyperlipidemia: Secondary | ICD-10-CM | POA: Diagnosis present

## 2020-08-10 DIAGNOSIS — D62 Acute posthemorrhagic anemia: Secondary | ICD-10-CM | POA: Diagnosis not present

## 2020-08-10 DIAGNOSIS — I152 Hypertension secondary to endocrine disorders: Secondary | ICD-10-CM | POA: Diagnosis present

## 2020-08-10 DIAGNOSIS — Z79899 Other long term (current) drug therapy: Secondary | ICD-10-CM | POA: Diagnosis not present

## 2020-08-10 DIAGNOSIS — J9811 Atelectasis: Secondary | ICD-10-CM

## 2020-08-10 DIAGNOSIS — Z953 Presence of xenogenic heart valve: Secondary | ICD-10-CM

## 2020-08-10 DIAGNOSIS — I5032 Chronic diastolic (congestive) heart failure: Secondary | ICD-10-CM | POA: Diagnosis present

## 2020-08-10 DIAGNOSIS — Z6835 Body mass index (BMI) 35.0-35.9, adult: Secondary | ICD-10-CM

## 2020-08-10 DIAGNOSIS — Z952 Presence of prosthetic heart valve: Secondary | ICD-10-CM

## 2020-08-10 DIAGNOSIS — I110 Hypertensive heart disease with heart failure: Secondary | ICD-10-CM | POA: Diagnosis present

## 2020-08-10 DIAGNOSIS — D649 Anemia, unspecified: Secondary | ICD-10-CM | POA: Diagnosis present

## 2020-08-10 DIAGNOSIS — I10 Essential (primary) hypertension: Secondary | ICD-10-CM | POA: Diagnosis present

## 2020-08-10 DIAGNOSIS — E1142 Type 2 diabetes mellitus with diabetic polyneuropathy: Secondary | ICD-10-CM | POA: Diagnosis present

## 2020-08-10 DIAGNOSIS — I4891 Unspecified atrial fibrillation: Secondary | ICD-10-CM | POA: Diagnosis not present

## 2020-08-10 DIAGNOSIS — E119 Type 2 diabetes mellitus without complications: Secondary | ICD-10-CM

## 2020-08-10 DIAGNOSIS — K219 Gastro-esophageal reflux disease without esophagitis: Secondary | ICD-10-CM | POA: Diagnosis present

## 2020-08-10 DIAGNOSIS — Z20822 Contact with and (suspected) exposure to covid-19: Secondary | ICD-10-CM | POA: Diagnosis present

## 2020-08-10 DIAGNOSIS — M5136 Other intervertebral disc degeneration, lumbar region: Secondary | ICD-10-CM | POA: Diagnosis present

## 2020-08-10 DIAGNOSIS — E1159 Type 2 diabetes mellitus with other circulatory complications: Secondary | ICD-10-CM | POA: Diagnosis present

## 2020-08-10 HISTORY — DX: Presence of xenogenic heart valve: Z95.3

## 2020-08-10 HISTORY — PX: AORTIC VALVE REPLACEMENT: SHX41

## 2020-08-10 HISTORY — PX: TEE WITHOUT CARDIOVERSION: SHX5443

## 2020-08-10 LAB — POCT I-STAT 7, (LYTES, BLD GAS, ICA,H+H)
Acid-Base Excess: 4 mmol/L — ABNORMAL HIGH (ref 0.0–2.0)
Acid-Base Excess: 1 mmol/L (ref 0.0–2.0)
Acid-Base Excess: 4 mmol/L — ABNORMAL HIGH (ref 0.0–2.0)
Acid-Base Excess: 4 mmol/L — ABNORMAL HIGH (ref 0.0–2.0)
Acid-Base Excess: 3 mmol/L — ABNORMAL HIGH (ref 0.0–2.0)
Acid-Base Excess: 0 mmol/L (ref 0.0–2.0)
Acid-Base Excess: 1 mmol/L (ref 0.0–2.0)
Acid-base deficit: 2 mmol/L (ref 0.0–2.0)
Bicarbonate: 30.9 mmol/L — ABNORMAL HIGH (ref 20.0–28.0)
Bicarbonate: 25.5 mmol/L (ref 20.0–28.0)
Bicarbonate: 26.5 mmol/L (ref 20.0–28.0)
Bicarbonate: 28.6 mmol/L — ABNORMAL HIGH (ref 20.0–28.0)
Bicarbonate: 26.6 mmol/L (ref 20.0–28.0)
Bicarbonate: 25.6 mmol/L (ref 20.0–28.0)
Bicarbonate: 25.3 mmol/L (ref 20.0–28.0)
Bicarbonate: 27.4 mmol/L (ref 20.0–28.0)
Calcium, Ion: 1.22 mmol/L (ref 1.15–1.40)
Calcium, Ion: 0.99 mmol/L — ABNORMAL LOW (ref 1.15–1.40)
Calcium, Ion: 1.01 mmol/L — ABNORMAL LOW (ref 1.15–1.40)
Calcium, Ion: 1.04 mmol/L — ABNORMAL LOW (ref 1.15–1.40)
Calcium, Ion: 0.99 mmol/L — ABNORMAL LOW (ref 1.15–1.40)
Calcium, Ion: 1.08 mmol/L — ABNORMAL LOW (ref 1.15–1.40)
Calcium, Ion: 1.11 mmol/L — ABNORMAL LOW (ref 1.15–1.40)
Calcium, Ion: 1.11 mmol/L — ABNORMAL LOW (ref 1.15–1.40)
HCT: 44 % (ref 36.0–46.0)
HCT: 24 % — ABNORMAL LOW (ref 36.0–46.0)
HCT: 27 % — ABNORMAL LOW (ref 36.0–46.0)
HCT: 24 % — ABNORMAL LOW (ref 36.0–46.0)
HCT: 26 % — ABNORMAL LOW (ref 36.0–46.0)
HCT: 30 % — ABNORMAL LOW (ref 36.0–46.0)
HCT: 29 % — ABNORMAL LOW (ref 36.0–46.0)
HCT: 32 % — ABNORMAL LOW (ref 36.0–46.0)
Hemoglobin: 15 g/dL (ref 12.0–15.0)
Hemoglobin: 8.2 g/dL — ABNORMAL LOW (ref 12.0–15.0)
Hemoglobin: 9.2 g/dL — ABNORMAL LOW (ref 12.0–15.0)
Hemoglobin: 8.2 g/dL — ABNORMAL LOW (ref 12.0–15.0)
Hemoglobin: 8.8 g/dL — ABNORMAL LOW (ref 12.0–15.0)
Hemoglobin: 10.2 g/dL — ABNORMAL LOW (ref 12.0–15.0)
Hemoglobin: 9.9 g/dL — ABNORMAL LOW (ref 12.0–15.0)
Hemoglobin: 10.9 g/dL — ABNORMAL LOW (ref 12.0–15.0)
O2 Saturation: 100 %
O2 Saturation: 100 %
O2 Saturation: 77 %
O2 Saturation: 100 %
O2 Saturation: 100 %
O2 Saturation: 97 %
O2 Saturation: 95 %
O2 Saturation: 98 %
Patient temperature: 36.3
Patient temperature: 37.1
Patient temperature: 37.2
Potassium: 2.8 mmol/L — ABNORMAL LOW (ref 3.5–5.1)
Potassium: 2.8 mmol/L — ABNORMAL LOW (ref 3.5–5.1)
Potassium: 3.1 mmol/L — ABNORMAL LOW (ref 3.5–5.1)
Potassium: 3.3 mmol/L — ABNORMAL LOW (ref 3.5–5.1)
Potassium: 3.1 mmol/L — ABNORMAL LOW (ref 3.5–5.1)
Potassium: 2.9 mmol/L — ABNORMAL LOW (ref 3.5–5.1)
Potassium: 3.5 mmol/L (ref 3.5–5.1)
Potassium: 3.3 mmol/L — ABNORMAL LOW (ref 3.5–5.1)
Sodium: 141 mmol/L (ref 135–145)
Sodium: 140 mmol/L (ref 135–145)
Sodium: 141 mmol/L (ref 135–145)
Sodium: 137 mmol/L (ref 135–145)
Sodium: 139 mmol/L (ref 135–145)
Sodium: 140 mmol/L (ref 135–145)
Sodium: 141 mmol/L (ref 135–145)
Sodium: 139 mmol/L (ref 135–145)
TCO2: 32 mmol/L (ref 22–32)
TCO2: 27 mmol/L (ref 22–32)
TCO2: 27 mmol/L (ref 22–32)
TCO2: 30 mmol/L (ref 22–32)
TCO2: 28 mmol/L (ref 22–32)
TCO2: 27 mmol/L (ref 22–32)
TCO2: 27 mmol/L (ref 22–32)
TCO2: 29 mmol/L (ref 22–32)
pCO2 arterial: 51.4 mmHg — ABNORMAL HIGH (ref 32.0–48.0)
pCO2 arterial: 32.3 mmHg (ref 32.0–48.0)
pCO2 arterial: 39 mmHg (ref 32.0–48.0)
pCO2 arterial: 41.4 mmHg (ref 32.0–48.0)
pCO2 arterial: 35.7 mmHg (ref 32.0–48.0)
pCO2 arterial: 41.7 mmHg (ref 32.0–48.0)
pCO2 arterial: 55.2 mmHg — ABNORMAL HIGH (ref 32.0–48.0)
pCO2 arterial: 52.5 mmHg — ABNORMAL HIGH (ref 32.0–48.0)
pH, Arterial: 7.386 (ref 7.350–7.450)
pH, Arterial: 7.424 (ref 7.350–7.450)
pH, Arterial: 7.522 — ABNORMAL HIGH (ref 7.350–7.450)
pH, Arterial: 7.448 (ref 7.350–7.450)
pH, Arterial: 7.481 — ABNORMAL HIGH (ref 7.350–7.450)
pH, Arterial: 7.392 (ref 7.350–7.450)
pH, Arterial: 7.269 — ABNORMAL LOW (ref 7.350–7.450)
pH, Arterial: 7.327 — ABNORMAL LOW (ref 7.350–7.450)
pO2, Arterial: 315 mmHg — ABNORMAL HIGH (ref 83.0–108.0)
pO2, Arterial: 353 mmHg — ABNORMAL HIGH (ref 83.0–108.0)
pO2, Arterial: 41 mmHg — ABNORMAL LOW (ref 83.0–108.0)
pO2, Arterial: 413 mmHg — ABNORMAL HIGH (ref 83.0–108.0)
pO2, Arterial: 236 mmHg — ABNORMAL HIGH (ref 83.0–108.0)
pO2, Arterial: 92 mmHg (ref 83.0–108.0)
pO2, Arterial: 91 mmHg (ref 83.0–108.0)
pO2, Arterial: 109 mmHg — ABNORMAL HIGH (ref 83.0–108.0)

## 2020-08-10 LAB — APTT: aPTT: 37 seconds — ABNORMAL HIGH (ref 24–36)

## 2020-08-10 LAB — POCT I-STAT, CHEM 8
BUN: 16 mg/dL (ref 8–23)
BUN: 13 mg/dL (ref 8–23)
BUN: 15 mg/dL (ref 8–23)
BUN: 13 mg/dL (ref 8–23)
Calcium, Ion: 1.23 mmol/L (ref 1.15–1.40)
Calcium, Ion: 1.03 mmol/L — ABNORMAL LOW (ref 1.15–1.40)
Calcium, Ion: 1.19 mmol/L (ref 1.15–1.40)
Calcium, Ion: 0.99 mmol/L — ABNORMAL LOW (ref 1.15–1.40)
Chloride: 99 mmol/L (ref 98–111)
Chloride: 98 mmol/L (ref 98–111)
Chloride: 98 mmol/L (ref 98–111)
Chloride: 97 mmol/L — ABNORMAL LOW (ref 98–111)
Creatinine, Ser: 0.8 mg/dL (ref 0.44–1.00)
Creatinine, Ser: 0.6 mg/dL (ref 0.44–1.00)
Creatinine, Ser: 0.7 mg/dL (ref 0.44–1.00)
Creatinine, Ser: 0.6 mg/dL (ref 0.44–1.00)
Glucose, Bld: 123 mg/dL — ABNORMAL HIGH (ref 70–99)
Glucose, Bld: 116 mg/dL — ABNORMAL HIGH (ref 70–99)
Glucose, Bld: 134 mg/dL — ABNORMAL HIGH (ref 70–99)
Glucose, Bld: 126 mg/dL — ABNORMAL HIGH (ref 70–99)
HCT: 31 % — ABNORMAL LOW (ref 36.0–46.0)
HCT: 22 % — ABNORMAL LOW (ref 36.0–46.0)
HCT: 27 % — ABNORMAL LOW (ref 36.0–46.0)
HCT: 23 % — ABNORMAL LOW (ref 36.0–46.0)
Hemoglobin: 10.5 g/dL — ABNORMAL LOW (ref 12.0–15.0)
Hemoglobin: 7.5 g/dL — ABNORMAL LOW (ref 12.0–15.0)
Hemoglobin: 9.2 g/dL — ABNORMAL LOW (ref 12.0–15.0)
Hemoglobin: 7.8 g/dL — ABNORMAL LOW (ref 12.0–15.0)
Potassium: 2.9 mmol/L — ABNORMAL LOW (ref 3.5–5.1)
Potassium: 2.8 mmol/L — ABNORMAL LOW (ref 3.5–5.1)
Potassium: 2.8 mmol/L — ABNORMAL LOW (ref 3.5–5.1)
Potassium: 3.2 mmol/L — ABNORMAL LOW (ref 3.5–5.1)
Sodium: 141 mmol/L (ref 135–145)
Sodium: 139 mmol/L (ref 135–145)
Sodium: 139 mmol/L (ref 135–145)
Sodium: 138 mmol/L (ref 135–145)
TCO2: 30 mmol/L (ref 22–32)
TCO2: 28 mmol/L (ref 22–32)
TCO2: 31 mmol/L (ref 22–32)
TCO2: 27 mmol/L (ref 22–32)

## 2020-08-10 LAB — ECHO INTRAOPERATIVE TEE
AR max vel: 1.01 cm2
AV Area VTI: 0.78 cm2
AV Area mean vel: 0.95 cm2
AV Mean grad: 27.7 mmHg
AV Peak grad: 47.6 mmHg
Ao pk vel: 3.45 m/s
Calc EF: 60 %
Height: 66 in
P 1/2 time: 556 msec
S' Lateral: 3 cm
Single Plane A2C EF: 54.9 %
Single Plane A4C EF: 64.7 %
Weight: 3296 oz

## 2020-08-10 LAB — HEMOGLOBIN AND HEMATOCRIT, BLOOD
HCT: 25.9 % — ABNORMAL LOW (ref 36.0–46.0)
Hemoglobin: 9 g/dL — ABNORMAL LOW (ref 12.0–15.0)

## 2020-08-10 LAB — CBC
HCT: 29.4 % — ABNORMAL LOW (ref 36.0–46.0)
HCT: 31 % — ABNORMAL LOW (ref 36.0–46.0)
Hemoglobin: 10.2 g/dL — ABNORMAL LOW (ref 12.0–15.0)
Hemoglobin: 10.4 g/dL — ABNORMAL LOW (ref 12.0–15.0)
MCH: 33 pg (ref 26.0–34.0)
MCH: 32.6 pg (ref 26.0–34.0)
MCHC: 34.7 g/dL (ref 30.0–36.0)
MCHC: 33.5 g/dL (ref 30.0–36.0)
MCV: 95.1 fL (ref 80.0–100.0)
MCV: 97.2 fL (ref 80.0–100.0)
Platelets: 100 10*3/uL — ABNORMAL LOW (ref 150–400)
Platelets: 124 10*3/uL — ABNORMAL LOW (ref 150–400)
RBC: 3.09 MIL/uL — ABNORMAL LOW (ref 3.87–5.11)
RBC: 3.19 MIL/uL — ABNORMAL LOW (ref 3.87–5.11)
RDW: 15.3 % (ref 11.5–15.5)
RDW: 15.2 % (ref 11.5–15.5)
WBC: 11.4 10*3/uL — ABNORMAL HIGH (ref 4.0–10.5)
WBC: 14.2 10*3/uL — ABNORMAL HIGH (ref 4.0–10.5)
nRBC: 0 % (ref 0.0–0.2)
nRBC: 0 % (ref 0.0–0.2)

## 2020-08-10 LAB — BASIC METABOLIC PANEL
Anion gap: 5 (ref 5–15)
BUN: 11 mg/dL (ref 8–23)
CO2: 27 mmol/L (ref 22–32)
Calcium: 7.5 mg/dL — ABNORMAL LOW (ref 8.9–10.3)
Chloride: 106 mmol/L (ref 98–111)
Creatinine, Ser: 0.77 mg/dL (ref 0.44–1.00)
GFR, Estimated: >60 mL/min (ref >60)
Glucose, Bld: 117 mg/dL — ABNORMAL HIGH (ref 70–99)
Potassium: 3.5 mmol/L (ref 3.5–5.1)
Sodium: 138 mmol/L (ref 135–145)

## 2020-08-10 LAB — GLUCOSE, CAPILLARY
Glucose-Capillary: 117 mg/dL — ABNORMAL HIGH (ref 70–99)
Glucose-Capillary: 110 mg/dL — ABNORMAL HIGH (ref 70–99)
Glucose-Capillary: 134 mg/dL — ABNORMAL HIGH (ref 70–99)
Glucose-Capillary: 104 mg/dL — ABNORMAL HIGH (ref 70–99)
Glucose-Capillary: 102 mg/dL — ABNORMAL HIGH (ref 70–99)
Glucose-Capillary: 109 mg/dL — ABNORMAL HIGH (ref 70–99)
Glucose-Capillary: 116 mg/dL — ABNORMAL HIGH (ref 70–99)
Glucose-Capillary: 118 mg/dL — ABNORMAL HIGH (ref 70–99)
Glucose-Capillary: 132 mg/dL — ABNORMAL HIGH (ref 70–99)
Glucose-Capillary: 114 mg/dL — ABNORMAL HIGH (ref 70–99)
Glucose-Capillary: 153 mg/dL — ABNORMAL HIGH (ref 70–99)
Glucose-Capillary: 137 mg/dL — ABNORMAL HIGH (ref 70–99)
Glucose-Capillary: 129 mg/dL — ABNORMAL HIGH (ref 70–99)

## 2020-08-10 LAB — POTASSIUM: Potassium: 3.3 mmol/L — ABNORMAL LOW (ref 3.5–5.1)

## 2020-08-10 LAB — PLATELET COUNT: Platelets: 134 10*3/uL — ABNORMAL LOW (ref 150–400)

## 2020-08-10 LAB — PROTIME-INR
INR: 1.5 — ABNORMAL HIGH (ref 0.8–1.2)
Prothrombin Time: 17.9 seconds — ABNORMAL HIGH (ref 11.4–15.2)

## 2020-08-10 LAB — MAGNESIUM: Magnesium: 3.2 mg/dL — ABNORMAL HIGH (ref 1.7–2.4)

## 2020-08-10 LAB — ABO/RH: ABO/RH(D): O POS

## 2020-08-10 SURGERY — REPLACEMENT, AORTIC VALVE, OPEN
Anesthesia: General | Site: Chest

## 2020-08-10 MED ORDER — CHLORHEXIDINE GLUCONATE CLOTH 2 % EX PADS
6.0000 | MEDICATED_PAD | Freq: Every day | CUTANEOUS | Status: DC
  Administered 2020-08-10 – 2020-08-13 (×4): 6 via TOPICAL

## 2020-08-10 MED ORDER — LIDOCAINE 2% (20 MG/ML) 5 ML SYRINGE
INTRAMUSCULAR | Status: DC | PRN
  Administered 2020-08-10: 40 mg via INTRAVENOUS

## 2020-08-10 MED ORDER — PHENYLEPHRINE HCL-NACL 20-0.9 MG/250ML-% IV SOLN: 0.0000 ug/min | INTRAVENOUS | Status: DC

## 2020-08-10 MED ORDER — SODIUM CHLORIDE 0.9 % IR SOLN
Status: DC | PRN
  Administered 2020-08-10: 5000 mL

## 2020-08-10 MED ORDER — LACTATED RINGERS IV SOLN: INTRAVENOUS | Status: DC

## 2020-08-10 MED ORDER — SODIUM CHLORIDE 0.9 % IV SOLN: INTRAVENOUS | Status: AC

## 2020-08-10 MED ORDER — ALBUMIN HUMAN 5 % IV SOLN: INTRAVENOUS | Status: DC | PRN

## 2020-08-10 MED ORDER — ASPIRIN EC 325 MG PO TBEC
325.0000 mg | DELAYED_RELEASE_TABLET | Freq: Every day | ORAL | Status: DC
  Administered 2020-08-11 – 2020-08-15 (×5): 325 mg via ORAL
  Filled 2020-08-10 (×5): qty 1

## 2020-08-10 MED ORDER — DEXMEDETOMIDINE HCL IN NACL 400 MCG/100ML IV SOLN
0.0000 ug/kg/h | INTRAVENOUS | Status: DC
  Administered 2020-08-10: 0.7 ug/kg/h via INTRAVENOUS
  Filled 2020-08-10: qty 100

## 2020-08-10 MED ORDER — SODIUM CHLORIDE 0.9% FLUSH
3.0000 mL | Freq: Two times a day (BID) | INTRAVENOUS | Status: DC
  Administered 2020-08-11 – 2020-08-12 (×4): 3 mL via INTRAVENOUS

## 2020-08-10 MED ORDER — TRAMADOL HCL 50 MG PO TABS
50.0000 mg | ORAL_TABLET | ORAL | Status: DC | PRN
  Administered 2020-08-11 (×2): 100 mg via ORAL
  Filled 2020-08-10 (×3): qty 2

## 2020-08-10 MED ORDER — TRAZODONE HCL 100 MG PO TABS: 100.0000 mg | ORAL_TABLET | Freq: Every day | ORAL | Status: DC

## 2020-08-10 MED ORDER — ASPIRIN 81 MG PO CHEW: 324.0000 mg | CHEWABLE_TABLET | Freq: Every day | ORAL | Status: DC

## 2020-08-10 MED ORDER — MIDAZOLAM HCL (PF) 10 MG/2ML IJ SOLN
INTRAMUSCULAR | Status: AC
  Filled 2020-08-10: qty 2

## 2020-08-10 MED ORDER — PHENYLEPHRINE HCL-NACL 20-0.9 MG/250ML-% IV SOLN
INTRAVENOUS | Status: DC | PRN
  Administered 2020-08-10: 25 ug/min via INTRAVENOUS

## 2020-08-10 MED ORDER — ACETAMINOPHEN 160 MG/5ML PO SOLN: 650.0000 mg | Freq: Once | ORAL | Status: AC

## 2020-08-10 MED ORDER — SODIUM CHLORIDE 0.9 % IV SOLN: INTRAVENOUS | Status: DC

## 2020-08-10 MED ORDER — DOCUSATE SODIUM 100 MG PO CAPS
200.0000 mg | ORAL_CAPSULE | Freq: Every day | ORAL | Status: DC
  Administered 2020-08-11 – 2020-08-14 (×4): 200 mg via ORAL
  Filled 2020-08-10 (×5): qty 2

## 2020-08-10 MED ORDER — SODIUM CHLORIDE (PF) 0.9 % IJ SOLN
INTRAMUSCULAR | Status: AC
  Filled 2020-08-10: qty 10

## 2020-08-10 MED ORDER — SODIUM BICARBONATE 8.4 % IV SOLN
50.0000 meq | Freq: Once | INTRAVENOUS | Status: AC
  Administered 2020-08-10: 50 meq via INTRAVENOUS

## 2020-08-10 MED ORDER — MIDAZOLAM HCL 5 MG/5ML IJ SOLN
INTRAMUSCULAR | Status: DC | PRN
  Administered 2020-08-10 (×2): 2 mg via INTRAVENOUS
  Administered 2020-08-10: 4 mg via INTRAVENOUS
  Administered 2020-08-10: 2 mg via INTRAVENOUS

## 2020-08-10 MED ORDER — PLASMA-LYTE 148 IV SOLN
INTRAVENOUS | Status: DC | PRN
  Administered 2020-08-10: 500 mL via INTRAVASCULAR

## 2020-08-10 MED ORDER — DEXMEDETOMIDINE HCL IN NACL 400 MCG/100ML IV SOLN
INTRAVENOUS | Status: DC | PRN
  Administered 2020-08-10: .4 ug/kg/h via INTRAVENOUS

## 2020-08-10 MED ORDER — INSULIN REGULAR(HUMAN) IN NACL 100-0.9 UT/100ML-% IV SOLN: INTRAVENOUS | Status: DC

## 2020-08-10 MED ORDER — ROCURONIUM BROMIDE 10 MG/ML (PF) SYRINGE
PREFILLED_SYRINGE | INTRAVENOUS | Status: AC
  Filled 2020-08-10: qty 20

## 2020-08-10 MED ORDER — TRANEXAMIC ACID 1000 MG/10ML IV SOLN
INTRAVENOUS | Status: DC | PRN
  Administered 2020-08-10: 1.5 mg/kg/h via INTRAVENOUS

## 2020-08-10 MED ORDER — MIDAZOLAM HCL 2 MG/2ML IJ SOLN: 2.0000 mg | INTRAMUSCULAR | Status: DC | PRN

## 2020-08-10 MED ORDER — LACTATED RINGERS IV SOLN: 500.0000 mL | Freq: Once | INTRAVENOUS | Status: DC | PRN

## 2020-08-10 MED ORDER — HEPARIN SODIUM (PORCINE) 1000 UNIT/ML IJ SOLN
INTRAMUSCULAR | Status: AC
  Filled 2020-08-10: qty 1

## 2020-08-10 MED ORDER — PROTAMINE SULFATE 10 MG/ML IV SOLN
INTRAVENOUS | Status: AC
  Filled 2020-08-10: qty 25

## 2020-08-10 MED ORDER — CHLORHEXIDINE GLUCONATE 4 % EX LIQD: 30.0000 mL | CUTANEOUS | Status: DC

## 2020-08-10 MED ORDER — EPHEDRINE SULFATE 50 MG/ML IJ SOLN
INTRAMUSCULAR | Status: DC | PRN
  Administered 2020-08-10: 5 mg via INTRAVENOUS

## 2020-08-10 MED ORDER — VANCOMYCIN HCL 1000 MG IV SOLR
INTRAVENOUS | Status: DC | PRN
  Administered 2020-08-10: 1000 mL

## 2020-08-10 MED ORDER — DEXTROSE 50 % IV SOLN: 0.0000 mL | INTRAVENOUS | Status: DC | PRN

## 2020-08-10 MED ORDER — ALBUMIN HUMAN 5 % IV SOLN
250.0000 mL | INTRAVENOUS | Status: DC | PRN
  Administered 2020-08-10 (×2): 12.5 g via INTRAVENOUS

## 2020-08-10 MED ORDER — METOPROLOL TARTRATE 12.5 MG HALF TABLET
12.5000 mg | ORAL_TABLET | Freq: Once | ORAL | Status: DC
  Filled 2020-08-10: qty 1

## 2020-08-10 MED ORDER — OXYCODONE HCL 5 MG PO TABS
5.0000 mg | ORAL_TABLET | ORAL | Status: DC | PRN
  Administered 2020-08-12: 10 mg via ORAL
  Filled 2020-08-10: qty 2

## 2020-08-10 MED ORDER — MAGNESIUM SULFATE 4 GM/100ML IV SOLN
4.0000 g | Freq: Once | INTRAVENOUS | Status: AC
  Administered 2020-08-10: 4 g via INTRAVENOUS
  Filled 2020-08-10: qty 100

## 2020-08-10 MED ORDER — SODIUM CHLORIDE (PF) 0.9 % IJ SOLN
OROMUCOSAL | Status: DC | PRN
  Administered 2020-08-10 (×5): 4 mL via TOPICAL

## 2020-08-10 MED ORDER — BISACODYL 5 MG PO TBEC
10.0000 mg | DELAYED_RELEASE_TABLET | Freq: Every day | ORAL | Status: DC
  Administered 2020-08-11 – 2020-08-15 (×3): 10 mg via ORAL
  Filled 2020-08-10 (×5): qty 2

## 2020-08-10 MED ORDER — PROTAMINE SULFATE 10 MG/ML IV SOLN
INTRAVENOUS | Status: AC
  Filled 2020-08-10: qty 5

## 2020-08-10 MED ORDER — PRAVASTATIN SODIUM 40 MG PO TABS: 40.0000 mg | ORAL_TABLET | Freq: Every day | ORAL | Status: DC

## 2020-08-10 MED ORDER — LACTATED RINGERS IV SOLN: INTRAVENOUS | Status: DC | PRN

## 2020-08-10 MED ORDER — MELATONIN 5 MG PO TABS
5.0000 mg | ORAL_TABLET | Freq: Every day | ORAL | Status: DC
  Administered 2020-08-10: 5 mg
  Filled 2020-08-10: qty 1

## 2020-08-10 MED ORDER — NITROGLYCERIN IN D5W 200-5 MCG/ML-% IV SOLN
0.0000 ug/min | INTRAVENOUS | Status: DC
  Administered 2020-08-11: 10 ug/min via INTRAVENOUS

## 2020-08-10 MED ORDER — FLUTICASONE PROPIONATE 50 MCG/ACT NA SUSP
1.0000 | Freq: Every day | NASAL | Status: DC | PRN
  Filled 2020-08-10: qty 16

## 2020-08-10 MED ORDER — SODIUM CHLORIDE 0.45 % IV SOLN: INTRAVENOUS | Status: DC | PRN

## 2020-08-10 MED ORDER — FAMOTIDINE IN NACL 20-0.9 MG/50ML-% IV SOLN
20.0000 mg | Freq: Two times a day (BID) | INTRAVENOUS | Status: AC
  Administered 2020-08-10 (×2): 20 mg via INTRAVENOUS
  Filled 2020-08-10 (×2): qty 50

## 2020-08-10 MED ORDER — FAMOTIDINE 20 MG PO TABS: 40.0000 mg | ORAL_TABLET | Freq: Every day | ORAL | Status: DC

## 2020-08-10 MED ORDER — LORATADINE 10 MG PO TABS: 10.0000 mg | ORAL_TABLET | Freq: Every day | ORAL | Status: DC

## 2020-08-10 MED ORDER — BISACODYL 10 MG RE SUPP: 10.0000 mg | Freq: Every day | RECTAL | Status: DC

## 2020-08-10 MED ORDER — FENTANYL CITRATE (PF) 250 MCG/5ML IJ SOLN
INTRAMUSCULAR | Status: AC
  Filled 2020-08-10: qty 25

## 2020-08-10 MED ORDER — ACETAMINOPHEN 160 MG/5ML PO SOLN: 1000.0000 mg | Freq: Four times a day (QID) | ORAL | Status: DC

## 2020-08-10 MED ORDER — METOCLOPRAMIDE HCL 5 MG/ML IJ SOLN
10.0000 mg | Freq: Four times a day (QID) | INTRAMUSCULAR | Status: AC
  Administered 2020-08-10 – 2020-08-11 (×4): 10 mg via INTRAVENOUS
  Filled 2020-08-10 (×4): qty 2

## 2020-08-10 MED ORDER — ARTIFICIAL TEARS OPHTHALMIC OINT
TOPICAL_OINTMENT | OPHTHALMIC | Status: DC | PRN
  Administered 2020-08-10: 1 via OPHTHALMIC

## 2020-08-10 MED ORDER — SODIUM CHLORIDE 0.9 % IV SOLN: 250.0000 mL | INTRAVENOUS | Status: DC

## 2020-08-10 MED ORDER — HYDROCORTISONE ACETATE 25 MG RE SUPP
25.0000 mg | Freq: Two times a day (BID) | RECTAL | Status: DC | PRN
  Filled 2020-08-10: qty 1

## 2020-08-10 MED ORDER — CHLORHEXIDINE GLUCONATE 0.12 % MT SOLN
15.0000 mL | OROMUCOSAL | Status: AC
  Administered 2020-08-10: 15 mL via OROMUCOSAL

## 2020-08-10 MED ORDER — FENTANYL CITRATE (PF) 100 MCG/2ML IJ SOLN
INTRAMUSCULAR | Status: DC | PRN
  Administered 2020-08-10 (×3): 50 ug via INTRAVENOUS
  Administered 2020-08-10: 100 ug via INTRAVENOUS
  Administered 2020-08-10: 150 ug via INTRAVENOUS
  Administered 2020-08-10 (×2): 100 ug via INTRAVENOUS
  Administered 2020-08-10 (×2): 200 ug via INTRAVENOUS
  Administered 2020-08-10: 50 ug via INTRAVENOUS
  Administered 2020-08-10: 200 ug via INTRAVENOUS

## 2020-08-10 MED ORDER — MORPHINE SULFATE (PF) 2 MG/ML IV SOLN
1.0000 mg | INTRAVENOUS | Status: DC | PRN
  Administered 2020-08-10: 2 mg via INTRAVENOUS
  Administered 2020-08-10: 1 mg via INTRAVENOUS
  Administered 2020-08-11 (×3): 2 mg via INTRAVENOUS
  Filled 2020-08-10 (×5): qty 1

## 2020-08-10 MED ORDER — SODIUM CHLORIDE 0.9% FLUSH: 3.0000 mL | INTRAVENOUS | Status: DC | PRN

## 2020-08-10 MED ORDER — ROCURONIUM BROMIDE 10 MG/ML (PF) SYRINGE
PREFILLED_SYRINGE | INTRAVENOUS | Status: DC | PRN
  Administered 2020-08-10: 50 mg via INTRAVENOUS
  Administered 2020-08-10: 60 mg via INTRAVENOUS
  Administered 2020-08-10: 40 mg via INTRAVENOUS

## 2020-08-10 MED ORDER — PANTOPRAZOLE SODIUM 40 MG PO TBEC
40.0000 mg | DELAYED_RELEASE_TABLET | Freq: Every day | ORAL | Status: DC
  Administered 2020-08-12 – 2020-08-15 (×4): 40 mg via ORAL
  Filled 2020-08-10 (×4): qty 1

## 2020-08-10 MED ORDER — PROPOFOL 10 MG/ML IV BOLUS
INTRAVENOUS | Status: AC
  Filled 2020-08-10: qty 20

## 2020-08-10 MED ORDER — ACETAMINOPHEN 650 MG RE SUPP
650.0000 mg | Freq: Once | RECTAL | Status: AC
  Administered 2020-08-10: 650 mg via RECTAL

## 2020-08-10 MED ORDER — PANTOPRAZOLE SODIUM 40 MG PO TBEC: 40.0000 mg | DELAYED_RELEASE_TABLET | Freq: Every day | ORAL | Status: DC

## 2020-08-10 MED ORDER — ARTIFICIAL TEARS OPHTHALMIC OINT
TOPICAL_OINTMENT | OPHTHALMIC | Status: AC
  Filled 2020-08-10: qty 3.5

## 2020-08-10 MED ORDER — HEPARIN SODIUM (PORCINE) 1000 UNIT/ML IJ SOLN
INTRAMUSCULAR | Status: DC | PRN
  Administered 2020-08-10: 32000 [IU] via INTRAVENOUS

## 2020-08-10 MED ORDER — ONDANSETRON HCL 4 MG/2ML IJ SOLN
4.0000 mg | Freq: Four times a day (QID) | INTRAMUSCULAR | Status: DC | PRN
  Administered 2020-08-11: 4 mg via INTRAVENOUS
  Filled 2020-08-10: qty 2

## 2020-08-10 MED ORDER — CHLORHEXIDINE GLUCONATE 0.12 % MT SOLN
15.0000 mL | Freq: Once | OROMUCOSAL | Status: AC
  Administered 2020-08-10 (×2): 15 mL via OROMUCOSAL
  Filled 2020-08-10: qty 15

## 2020-08-10 MED ORDER — ACETAMINOPHEN 500 MG PO TABS
1000.0000 mg | ORAL_TABLET | Freq: Four times a day (QID) | ORAL | Status: DC
  Administered 2020-08-10 – 2020-08-15 (×18): 1000 mg via ORAL
  Filled 2020-08-10 (×18): qty 2

## 2020-08-10 MED ORDER — LEVOFLOXACIN IN D5W 750 MG/150ML IV SOLN
750.0000 mg | INTRAVENOUS | Status: AC
  Administered 2020-08-11: 750 mg via INTRAVENOUS
  Filled 2020-08-10: qty 150

## 2020-08-10 MED ORDER — PROPOFOL 10 MG/ML IV BOLUS
INTRAVENOUS | Status: DC | PRN
  Administered 2020-08-10: 90 mg via INTRAVENOUS

## 2020-08-10 MED ORDER — INSULIN REGULAR(HUMAN) IN NACL 100-0.9 UT/100ML-% IV SOLN
INTRAVENOUS | Status: DC | PRN
  Administered 2020-08-10: 2.2 [IU]/h via INTRAVENOUS

## 2020-08-10 MED ORDER — POTASSIUM CHLORIDE 10 MEQ/50ML IV SOLN
10.0000 meq | INTRAVENOUS | Status: AC
  Administered 2020-08-10 (×3): 10 meq via INTRAVENOUS

## 2020-08-10 MED ORDER — POTASSIUM CHLORIDE 10 MEQ/50ML IV SOLN
10.0000 meq | INTRAVENOUS | Status: AC
  Administered 2020-08-10 (×2): 10 meq via INTRAVENOUS
  Filled 2020-08-10 (×2): qty 50

## 2020-08-10 MED ORDER — VANCOMYCIN HCL IN DEXTROSE 1-5 GM/200ML-% IV SOLN
1000.0000 mg | Freq: Once | INTRAVENOUS | Status: AC
  Administered 2020-08-10: 1000 mg via INTRAVENOUS
  Filled 2020-08-10: qty 200

## 2020-08-10 MED ORDER — NITROGLYCERIN IN D5W 200-5 MCG/ML-% IV SOLN
INTRAVENOUS | Status: DC | PRN
  Administered 2020-08-10: 10 ug/min via INTRAVENOUS

## 2020-08-10 MED ORDER — PROTAMINE SULFATE 10 MG/ML IV SOLN
INTRAVENOUS | Status: DC | PRN
  Administered 2020-08-10: 250 mg via INTRAVENOUS

## 2020-08-10 SURGICAL SUPPLY — 84 items
ADAPTER CARDIO PERF ANTE/RETRO (ADAPTER) ×3 IMPLANT
ADPR PRFSN 84XANTGRD RTRGD (ADAPTER) ×2
BLADE CLIPPER SURG (BLADE) ×2 IMPLANT
BLADE STERNUM SYSTEM 6 (BLADE) ×3 IMPLANT
BLADE SURG 11 STRL SS (BLADE) ×3 IMPLANT
CANISTER SUCT 3000ML PPV (MISCELLANEOUS) ×3 IMPLANT
CANNULA EZ GLIDE AORTIC 21FR (CANNULA) ×3 IMPLANT
CANNULA GUNDRY RCSP 15FR (MISCELLANEOUS) ×3 IMPLANT
CANNULA MC2 2 STG 36/46 NON-V (CANNULA) IMPLANT
CANNULA VENOUS 2 STG 34/46 (CANNULA) ×3
CATH CPB KIT OWEN (MISCELLANEOUS) ×3 IMPLANT
CATH HEART VENT LEFT (CATHETERS) ×2 IMPLANT
CATH THORACIC 36FR RT ANG (CATHETERS) ×3 IMPLANT
CNTNR URN SCR LID CUP LEK RST (MISCELLANEOUS) IMPLANT
CONT SPEC 4OZ STRL OR WHT (MISCELLANEOUS) ×3
COVER SURGICAL LIGHT HANDLE (MISCELLANEOUS) ×3 IMPLANT
DEVICE SUT CK QUICK LOAD MINI (Prosthesis & Implant Heart) ×1 IMPLANT
DRAIN CHANNEL 32F RND 10.7 FF (WOUND CARE) ×3 IMPLANT
DRAPE CARDIOVASCULAR INCISE (DRAPES) ×3
DRAPE CV SPLIT W-CLR ANES SCRN (DRAPES) IMPLANT
DRAPE INCISE IOBAN 66X45 STRL (DRAPES) ×6 IMPLANT
DRAPE PERI GROIN 82X75IN TIB (DRAPES) IMPLANT
DRAPE SLUSH/WARMER DISC (DRAPES) ×3 IMPLANT
DRAPE SRG 135X102X78XABS (DRAPES) IMPLANT
DRSG AQUACEL AG ADV 3.5X14 (GAUZE/BANDAGES/DRESSINGS) ×3 IMPLANT
ELECT REM PT RETURN 9FT ADLT (ELECTROSURGICAL) ×6
ELECTRODE REM PT RTRN 9FT ADLT (ELECTROSURGICAL) ×4 IMPLANT
FELT TEFLON 1X6 (MISCELLANEOUS) ×5 IMPLANT
FIBERTAPE STERNAL CLSR 2 36IN (SUTURE) ×4 IMPLANT
FIBERTAPE STERNAL CLSR 2X36 (SUTURE) ×4 IMPLANT
GAUZE SPONGE 4X4 12PLY STRL (GAUZE/BANDAGES/DRESSINGS) ×5 IMPLANT
GLOVE ORTHO TXT STRL SZ7.5 (GLOVE) ×9 IMPLANT
GLOVE SURG UNDER POLY LF SZ6 (GLOVE) ×2 IMPLANT
GLOVE SURG UNDER POLY LF SZ6.5 (GLOVE) ×6 IMPLANT
GOWN STRL REUS W/ TWL LRG LVL3 (GOWN DISPOSABLE) ×8 IMPLANT
GOWN STRL REUS W/TWL LRG LVL3 (GOWN DISPOSABLE) ×27
HEMOSTAT POWDER SURGIFOAM 1G (HEMOSTASIS) ×9 IMPLANT
INSERT FOGARTY XLG (MISCELLANEOUS) ×3 IMPLANT
KIT BASIN OR (CUSTOM PROCEDURE TRAY) ×3 IMPLANT
KIT DRAINAGE VACCUM ASSIST (KITS) ×1 IMPLANT
KIT SUCTION CATH 14FR (SUCTIONS) ×9 IMPLANT
KIT SUT CK MINI COMBO 4X17 (Prosthesis & Implant Heart) ×1 IMPLANT
KIT TURNOVER KIT B (KITS) ×3 IMPLANT
LEAD PACING MYOCARDI (MISCELLANEOUS) ×3 IMPLANT
LINE VENT (MISCELLANEOUS) ×1 IMPLANT
NDL SUT PASSING CERCLAG MED (SUTURE) IMPLANT
NDL SUT PASSING CERCLAGE MED (SUTURE) ×6
NEEDLE SUT PASSING CERCLAG MED (SUTURE) ×4 IMPLANT
NS IRRIG 1000ML POUR BTL (IV SOLUTION) ×15 IMPLANT
PACK E OPEN HEART (SUTURE) ×3 IMPLANT
PACK OPEN HEART (CUSTOM PROCEDURE TRAY) ×3 IMPLANT
PAD ARMBOARD 7.5X6 YLW CONV (MISCELLANEOUS) ×6 IMPLANT
POSITIONER HEAD DONUT 9IN (MISCELLANEOUS) ×3 IMPLANT
PUMP SARN DELFIN (MISCELLANEOUS) ×1 IMPLANT
SET CARDIOPLEGIA MPS 5001102 (MISCELLANEOUS) ×1 IMPLANT
SOL ANTI FOG 6CC (MISCELLANEOUS) IMPLANT
SOLUTION ANTI FOG 6CC (MISCELLANEOUS) ×1
SPONGE LAP 4X18 RFD (DISPOSABLE) ×1 IMPLANT
SUT BONE WAX W31G (SUTURE) ×3 IMPLANT
SUT EB EXC GRN/WHT 2-0 V-5 (SUTURE) ×6 IMPLANT
SUT ETHIBOND X763 2 0 SH 1 (SUTURE) ×9 IMPLANT
SUT MNCRL AB 3-0 PS2 18 (SUTURE) ×6 IMPLANT
SUT PDS AB 1 CTX 36 (SUTURE) ×6 IMPLANT
SUT PROLENE 3 0 SH DA (SUTURE) ×2 IMPLANT
SUT PROLENE 4 0 RB 1 (SUTURE) ×36
SUT PROLENE 4 0 SH DA (SUTURE) ×3 IMPLANT
SUT PROLENE 4-0 RB1 .5 CRCL 36 (SUTURE) ×4 IMPLANT
SUT PROLENE 5 0 C 1 36 (SUTURE) IMPLANT
SUT PROLENE 6 0 C 1 30 (SUTURE) IMPLANT
SUT SILK  1 MH (SUTURE) ×3
SUT SILK 1 MH (SUTURE) ×2 IMPLANT
SUT SILK 2 0 SH CR/8 (SUTURE) IMPLANT
SUT SILK 3 0 SH CR/8 (SUTURE) IMPLANT
SUT STEEL 6MS V (SUTURE) IMPLANT
SUT VIC AB 2-0 CTX 27 (SUTURE) IMPLANT
SYSTEM SAHARA CHEST DRAIN ATS (WOUND CARE) ×3 IMPLANT
TAPE CLOTH SURG 4X10 WHT LF (GAUZE/BANDAGES/DRESSINGS) ×1 IMPLANT
TAPE PAPER 2X10 WHT MICROPORE (GAUZE/BANDAGES/DRESSINGS) ×1 IMPLANT
TOWEL GREEN STERILE (TOWEL DISPOSABLE) ×3 IMPLANT
TOWEL GREEN STERILE FF (TOWEL DISPOSABLE) ×3 IMPLANT
UNDERPAD 30X36 HEAVY ABSORB (UNDERPADS AND DIAPERS) ×3 IMPLANT
VALVE AORTIC SZ21 INSP/RESIL (Valve) ×1 IMPLANT
VENT LEFT HEART 12002 (CATHETERS) ×3
WATER STERILE IRR 1000ML POUR (IV SOLUTION) ×6 IMPLANT

## 2020-08-10 NOTE — Progress Notes (Signed)
  Echocardiogram Echocardiogram Transesophageal has been performed.  Megan Galvan 08/10/2020, 8:55 AM

## 2020-08-10 NOTE — Interval H&P Note (Signed)
History and Physical Interval Note:  08/10/2020 5:59 AM  Megan Galvan  has presented today for surgery, with the diagnosis of AS.  The various methods of treatment have been discussed with the patient and family. After consideration of risks, benefits and other options for treatment, the patient has consented to  Procedure(s): AORTIC VALVE REPLACEMENT (AVR) (N/A) TRANSESOPHAGEAL ECHOCARDIOGRAM (TEE) (N/A) as a surgical intervention.  The patient's history has been reviewed, patient examined, no change in status, stable for surgery.  I have reviewed the patient's chart and labs.  Questions were answered to the patient's satisfaction.     Rexene Alberts

## 2020-08-10 NOTE — Brief Op Note (Signed)
08/10/2020  11:47 AM  PATIENT:  Megan Galvan  70 y.o. female  PRE-OPERATIVE DIAGNOSIS:  Aortic Stenosis  POST-OPERATIVE DIAGNOSIS:  Aortic Stenosis  PROCEDURE:  Procedure(s):  AORTIC VALVE REPLACEMENT -21 mm Inspiris Resilia Aortic Valve  TRANSESOPHAGEAL ECHOCARDIOGRAM (TEE) (N/A)  SURGEON:  Surgeon(s) and Role:    Rexene Alberts, MD - Primary  PHYSICIAN ASSISTANT:Erin Barrett PA-C   ANESTHESIA:   general  EBL:  Per perfusion record   BLOOD ADMINISTERED: CELLSAVER  DRAINS: Mediastinal Chest Drains   LOCAL MEDICATIONS USED:  NONE  SPECIMEN:  Source of Specimen:  Aortic Valve Leaflets  DISPOSITION OF SPECIMEN:  PATHOLOGY  COUNTS:  YES  TOURNIQUET:  * No tourniquets in log *  DICTATION: .Dragon Dictation  PLAN OF CARE: Admit to inpatient   PATIENT DISPOSITION:  ICU - intubated and hemodynamically stable.   Delay start of Pharmacological VTE agent (>24hrs) due to surgical blood loss or risk of bleeding: yes

## 2020-08-10 NOTE — Progress Notes (Signed)
Pt is awake and weaning appropriately, passed breathing mechanics per RT, ABG obtained and results are as followed: pH 7.269, pCO2 55.2 HCO3 25.3 pO2 91. Dr. Cyndia Bent MD notified. 46meq bicarb ordered per Dr. Cyndia Bent. After 60meq of bicarb given okay to extubate per Dr. Cyndia Bent MD.   [id number]: Pt extubated per RT and RN. Pt uses home CPAP. RT notified. Pt resting comfortably and easily awakens. O2 sats 100% on 4L [place]. Will continue to monitor.   Luberta Mutter RN

## 2020-08-10 NOTE — Anesthesia Procedure Notes (Signed)
Procedure Name: Intubation Date/Time: 08/10/2020 8:26 AM Performed by: Kathryne Hitch, CRNA Pre-anesthesia Checklist: Patient identified, Emergency Drugs available, Suction available and Patient being monitored Patient Re-evaluated:Patient Re-evaluated prior to induction Oxygen Delivery Method: Circle system utilized Preoxygenation: Pre-oxygenation with 100% oxygen Induction Type: IV induction Ventilation: Mask ventilation with difficulty, Oral airway inserted - appropriate to patient size and Two handed mask ventilation required Laryngoscope Size: Miller and 3 Grade View: Grade II Tube type: Oral Tube size: 8.0 mm Number of attempts: 2 Airway Equipment and Method: Stylet and Oral airway Placement Confirmation: ETT inserted through vocal cords under direct vision,  positive ETCO2 and breath sounds checked- equal and bilateral Secured at: 22 cm Tube secured with: Tape Dental Injury: Teeth and Oropharynx as per pre-operative assessment

## 2020-08-10 NOTE — Transfer of Care (Signed)
Immediate Anesthesia Transfer of Care Note  Patient: Megan Galvan  Procedure(s) Performed: AORTIC VALVE REPLACEMENT (AVR) USING INSPIRIS VALVE SIZE 21MM (N/A Chest) TRANSESOPHAGEAL ECHOCARDIOGRAM (TEE) (N/A )  Patient Location: ICU  Anesthesia Type:General  Level of Consciousness: Patient remains intubated per anesthesia plan  Airway & Oxygen Therapy: Patient remains intubated per anesthesia plan and Patient placed on Ventilator (see vital sign flow sheet for setting)  Post-op Assessment: Report given to RN and Post -op Vital signs reviewed and stable  Post vital signs: Reviewed and stable  Last Vitals:  Vitals Value Taken Time  BP 114/56   Temp    Pulse 80 08/10/20 1221  Resp 12 08/10/20 1221  SpO2 98 % 08/10/20 1221  Vitals shown include unvalidated device data.  Last Pain:  Vitals:   08/10/20 0610  TempSrc:   PainSc: 0-No pain         Complications: No complications documented.

## 2020-08-10 NOTE — Progress Notes (Signed)
Patient stated she took metoprolol and omeprazole with "a little bit" of applesauce at 0230am morning of surgery.  Dr. Deatra Canter made aware.

## 2020-08-10 NOTE — Procedures (Signed)
Extubation Procedure Note  Patient Details:   Name: HARA MILHOLLAND DOB: January 23, 1951 MRN: [id number]   Airway Documentation:    Vent end date: 08/10/20 Vent end time: 1832   Evaluation  O2 sats: stable throughout Complications: No apparent complications Patient did tolerate procedure well. Bilateral Breath Sounds: Clear,Diminished   Yes, pt could speak post extubation.  Pt extubated to 4 l/m [place].  Earney Navy 08/10/2020, 6:34 PM

## 2020-08-10 NOTE — Op Note (Signed)
CARDIOTHORACIC SURGERY OPERATIVE NOTE  Date of Procedure:  08/10/2020  Preoperative Diagnosis: Severe Aortic Stenosis   Postoperative Diagnosis: Same   Procedure:    Aortic Valve Replacement  Edwards Inspiris Resilia stented bovine pericardial tissue valve (size 21 mm, ref # [id number], serial # [id number])   Surgeon: Valentina Gu. Roxy Manns, MD  Assistant: Ellwood Handler, PA-C  Anesthesia: Roberts Gaudy, MD  Operative Findings:  [place] type I bicuspid aortic valve  Severe aortic stenosis  Normal left ventricular systolic function            BRIEF CLINICAL NOTE AND INDICATIONS FOR SURGERY  Patient is 70 year old moderately obese female with longstanding history of heart murmur, hypertension, hyperlipidemia, and type 2 diabetes mellitus who has been referred for surgical consultation to discuss treatment options for recently discovered bicuspid aortic valve with critical aortic stenosis.  Patient states that she has been told that she had a heart murmur all of her life.  She has never been formally evaluated by cardiologist until recently.  She describes a 1 year history of progressive symptoms of exertional shortness of breath and chest tightness which has gotten much worse over the past 4 to 6 weeks.  She now gets short of breath and tightness across her chest with very low level activity and occasionally at rest.  She has had episodes of nocturnal shortness of breath and she cannot lay flat in bed.  She has had some mild dizzy spells without syncope.  She was evaluated by her primary care physician and underwent a transthoracic echocardiogram at [facility] which revealed findings suggestive of bicuspid aortic valve with severe aortic stenosis.  She was evaluated by Dr. Bettina Gavia and subsequently referred to our multidisciplinary heart valve clinic.  She underwent diagnostic cardiac catheterization by Dr. Burt Knack on July 27, 2020 confirming the presence of severe  aortic stenosis.  The patient was noted to have no significant coronary artery disease and mildly elevated right heart pressures.  CT angiography was performed earlier today and the patient referred for surgical consultation.  The patient has been seen in consultation and counseled at length regarding the indications, risks and potential benefits of surgery.  All questions have been answered, and the patient provides full informed consent for the operation as described.    DETAILS OF THE OPERATIVE PROCEDURE  Preparation:  The patient is brought to the operating room on the above mentioned date and central monitoring was established by the anesthesia team including placement of Swan-Ganz catheter and radial arterial line. The patient is placed in the supine position on the operating table.  Intravenous antibiotics are administered. General endotracheal anesthesia is induced uneventfully. A Foley catheter is placed.  Baseline transesophageal echocardiogram was performed.  Findings were notable for congenitally bicuspid aortic valve with severe aortic stenosis.  There was normal left ventricular systolic function with moderate left ventricular hypertrophy.  The patient's chest, abdomen, both groins, and both lower extremities are prepared and draped in a sterile manner. A time out procedure is performed.   Surgical Approach:  A median sternotomy incision was performed and the pericardium is opened. The ascending aorta is normal in appearance.    Extracorporeal Cardiopulmonary Bypass and Myocardial Protection:  The ascending aorta and the right atrium are cannulated for cardiopulmonary bypass.  Adequate heparinization is verified.   A retrograde cardioplegia cannula is placed through the right atrium into the coronary sinus.  The operative field was continuously flooded with carbon dioxide gas.  The entire pre-bypass portion  of the operation was notable for stable  hemodynamics.  Cardiopulmonary bypass was begun and the surface of the heart is inspected.  A left ventricular vent is placed through the right superior pulmonary vein.  A cardioplegia cannula is placed in the ascending aorta.  A temperature probe was placed in the interventricular septum.  The patient is cooled to 32C systemic temperature.  The aortic cross clamp is applied and cardioplegia is delivered initially in an antegrade fashion through the aortic root using modified [place] cold blood cardioplegia ([place] blood cardioplegia protocol).   The initial cardioplegic arrest is rapid with early diastolic arrest.  Myocardial protection was felt to be excellent.   Aortic Valve Replacement:  An oblique transverse aortotomy incision was performed.  The aortic valve was inspected and notable for [place] type I congenitally bicuspid aortic valve with a single raphae and fusion of the left and right leaflets.  There was severe aortic stenosis.  The aortic valve leaflets were excised sharply and the aortic annulus decalcified.  Decalcification was notably straightforward.  The aortic annulus was sized to accept a 21 mm prosthesis.  The aortic root and left ventricle were irrigated with copious cold saline solution.  Aortic valve replacement was performed using interrupted horizontal mattress 2-0 Ethibond pledgeted sutures with pledgets in the subannular position.  An Edwards Inspiris Resilia stented bovine pericardial tissue valve (size 21 mm, ref # [id number], serial # [id number]) was implanted uneventfully. All sutures were secured using a Cor-knot device.  The valve seated appropriately with adequate space beneath the left main and right coronary artery.   Procedure Completion:  The aortotomy was closed using a 2-layer closure of running 4-0 Prolene suture.  One final dose of warm retrograde "reanimation dose" cardioplegia was administered retrograde through the coronary sinus catheter while all air was  evacuated through the aortic root.  The aortic cross clamp was removed after a total cross clamp time of 73 minutes.  Epicardial pacing wires are fixed to the right ventricular outflow tract and to the right atrial appendage. The patient is rewarmed to 37C temperature. The aortic and left ventricular vents are removed.  The patient is weaned and disconnected from cardiopulmonary bypass.  The patient's rhythm at separation from bypass was sinus.  The patient was weaned from cardiopulmonary bypass without any inotropic support. Total cardiopulmonary bypass time for the operation was 96 minutes.  Followup transesophageal echocardiogram performed after separation from bypass revealed a well-seated aortic valve prosthesis that was functioning normally and without any sign of paravalvular leak.  Left ventricular function was unchanged from preoperatively.  The aortic and venous cannula were removed uneventfully. Protamine was administered to reverse the anticoagulation. The mediastinum and pleural space were inspected for hemostasis and irrigated with saline solution. The mediastinum was drained using 2 chest tubes placed through separate stab incisions inferiorly.  The soft tissues anterior to the aorta were reapproximated loosely. The sternum is closed with Fibertape cerclage. The soft tissues anterior to the sternum were closed in multiple layers and the skin is closed with a running subcuticular skin closure.  The post-bypass portion of the operation was notable for stable rhythm and hemodynamics.  No blood products were administered during the operation.   Disposition:  The patient tolerated the procedure well and is transported to the surgical intensive care in stable condition. There are no intraoperative complications. All sponge instrument and needle counts are verified correct at completion of the operation.    Valentina Gu. Roxy Manns MD 08/10/2020 11:47  AM

## 2020-08-10 NOTE — Anesthesia Procedure Notes (Signed)
Arterial Line Insertion Start/End4/14/2022 7:17 AM, 08/10/2020 7:17 AM Performed by: Kathryne Hitch, CRNA, CRNA  Patient location: Pre-op. Preanesthetic checklist: patient identified, IV checked, site marked, risks and benefits discussed, surgical consent, monitors and equipment checked, pre-op evaluation, timeout performed and anesthesia consent Lidocaine 1% used for infiltration Left, radial was placed Catheter size: 20 Fr Hand hygiene performed  and maximum sterile barriers used   Attempts: 1 Procedure performed without using ultrasound guided technique. Following insertion, dressing applied and Biopatch. Post procedure assessment: normal and unchanged  Patient tolerated the procedure well with no immediate complications.

## 2020-08-10 NOTE — Anesthesia Preprocedure Evaluation (Signed)
Anesthesia Evaluation  Patient identified by MRN, date of birth, ID band Patient awake    Reviewed: Allergy & Precautions, NPO status , Patient's Chart, lab work & pertinent test results  Airway Mallampati: III  TM Distance: >3 FB Neck ROM: Full    Dental  (+) Teeth Intact, Dental Advisory Given   Pulmonary    breath sounds clear to auscultation       Cardiovascular hypertension,  Rhythm:Regular Rate:Normal + Systolic murmurs    Neuro/Psych    GI/Hepatic   Endo/Other  diabetes  Renal/GU      Musculoskeletal   Abdominal (+) + obese,   Peds  Hematology   Anesthesia Other Findings   Reproductive/Obstetrics                             Anesthesia Physical Anesthesia Plan  ASA: III  Anesthesia Plan: General   Post-op Pain Management:    Induction: Intravenous  PONV Risk Score and Plan: Ondansetron and Dexamethasone  Airway Management Planned: Oral ETT  Additional Equipment: Arterial line, 3D TEE, PA Cath and Ultrasound Guidance Line Placement  Intra-op Plan:   Post-operative Plan:   Informed Consent: I have reviewed the patients History and Physical, chart, labs and discussed the procedure including the risks, benefits and alternatives for the proposed anesthesia with the patient or authorized representative who has indicated his/her understanding and acceptance.     Dental advisory given  Plan Discussed with: CRNA and Anesthesiologist  Anesthesia Plan Comments:         Anesthesia Quick Evaluation

## 2020-08-10 NOTE — Anesthesia Procedure Notes (Signed)
Central Venous Catheter Insertion Performed by: Roberts Gaudy, MD, anesthesiologist Start/End4/14/2022 6:35 AM, 08/10/2020 6:45 AM Patient location: Pre-op. Preanesthetic checklist: patient identified, IV checked, site marked, risks and benefits discussed, surgical consent, monitors and equipment checked, pre-op evaluation, timeout performed and anesthesia consent Position: supine Hand hygiene performed  and maximum sterile barriers used  PA cath was placed.Swan type:thermodilution Procedure performed without using ultrasound guided technique. Attempts: 1 Following insertion, line sutured, dressing applied and Biopatch. Post procedure assessment: blood return through all ports and free fluid flow  Patient tolerated the procedure well with no immediate complications.

## 2020-08-10 NOTE — Progress Notes (Signed)
Patient ID: Megan Galvan, female   DOB: 1951/04/22, 70 y.o.   MRN: [id number]  TCTS Evening Rounds:   Hemodynamically stable  CI = 2.1  Weaning on vent  Urine output good  CT output low  CBC    Component Value Date/Time   WBC 11.4 (H) 08/10/2020 1236   RBC 3.09 (L) 08/10/2020 1236   HGB 10.2 (L) 08/10/2020 1238   HGB 12.2 07/26/2020 1042   HCT 30.0 (L) 08/10/2020 1238   HCT 36.0 07/26/2020 1042   PLT 100 (L) 08/10/2020 1236   PLT 192 07/26/2020 1042   MCV 95.1 08/10/2020 1236   MCV 98 (H) 07/26/2020 1042   MCH 33.0 08/10/2020 1236   MCHC 34.7 08/10/2020 1236   RDW 15.3 08/10/2020 1236   RDW 12.9 07/26/2020 1042   LYMPHSABS 2.9 05/09/2020 1132   EOSABS 0.1 05/09/2020 1132   BASOSABS 0.1 05/09/2020 1132     BMET    Component Value Date/Time   NA 140 08/10/2020 1238   NA 142 07/26/2020 1042   K 2.9 (L) 08/10/2020 1238   CL 97 (L) 08/10/2020 1104   CO2 27 08/08/2020 1159   GLUCOSE 126 (H) 08/10/2020 1104   BUN 13 08/10/2020 1104   BUN 17 07/26/2020 1042   CREATININE 0.60 08/10/2020 1104   CALCIUM 9.1 08/08/2020 1159   GFRNONAA >60 08/08/2020 1159   GFRAA 84 05/09/2020 1132     A/P:  Stable postop course. Continue current plans

## 2020-08-10 NOTE — Anesthesia Procedure Notes (Signed)
Central Venous Catheter Insertion Performed by: Roberts Gaudy, MD, anesthesiologist Start/End4/14/2022 6:35 AM, 08/10/2020 6:45 AM Patient location: Pre-op. Preanesthetic checklist: patient identified, IV checked, site marked, risks and benefits discussed, surgical consent, monitors and equipment checked, pre-op evaluation, timeout performed and anesthesia consent Lidocaine 1% used for infiltration and patient sedated Hand hygiene performed  and maximum sterile barriers used  Catheter size: 9 Fr Sheath introducer Procedure performed using ultrasound guided technique. Ultrasound Notes:anatomy identified, needle tip was noted to be adjacent to the nerve/plexus identified, no ultrasound evidence of intravascular and/or intraneural injection and image(s) printed for medical record Attempts: 1 Following insertion, line sutured and dressing applied. Post procedure assessment: blood return through all ports, free fluid flow and no air  Patient tolerated the procedure well with no immediate complications.

## 2020-08-10 NOTE — Hospital Course (Addendum)
History of Present Illness:  Patient is 70 year old moderately obese female with longstanding history of heart murmur, hypertension, hyperlipidemia, and type 2 diabetes mellitus who has been referred for surgical consultation to discuss treatment options for recently discovered bicuspid aortic valve with critical aortic stenosis.   Patient states that she has been told that she had a heart murmur all of her life.  She has never been formally evaluated by cardiologist until recently.  She describes a 1 year history of progressive symptoms of exertional shortness of breath and chest tightness which has gotten much worse over the past 4 to 6 weeks.  She now gets short of breath and tightness across her chest with very low level activity and occasionally at rest.  She has had episodes of nocturnal shortness of breath and she cannot lay flat in bed.  She has had some mild dizzy spells without syncope.  She was evaluated by her primary care physician and underwent a transthoracic echocardiogram at [facility] which revealed findings suggestive of bicuspid aortic valve with severe aortic stenosis.  She was evaluated by Dr. Bettina Gavia and subsequently referred to our multidisciplinary heart valve clinic.  She underwent diagnostic cardiac catheterization by Dr. Burt Knack on July 27, 2020 confirming the presence of severe aortic stenosis.  The patient was noted to have no significant coronary artery disease and mildly elevated right heart pressures.  CT angiography was performed earlier today and the patient referred for surgical consultation.  She was evaluated by Dr. Roxy Manns at which time patient admitted to experiencing a 1 year history of progressive symptoms of exertional shortness of breath which is gotten particularly severe over the past 4 to 6 weeks.  She has remained functionally independent although she does not exercise or walk on a regular basis.  She has had some problems with both cervical and  lumbar degenerative disc disease but she still gets around fairly well without significant physical limitations.  It was felt the patient would benefit from surgical intervention on her Aortic Valve.  The risks and benefits of the procedure were explained to the patient and she was agreeable to proceed.  Hospital Course:  Mrs. Megan Galvan presented to [facility] on 08/10/2020.  She was taken to the operating room and underwent Aortic Valve replacement with a 21 mm Edwards Resilia Bioprosthetic tissue valve.  She tolerated the procedure without difficulty and was taken to the SICU in stable condition.  The patient was extubated without difficulty.  The patient was hypertensive and required NTG drip.  This was weaned as oral antihypertensive agents were initiated.  She is a well controlled diabetic, she was transitioned off insulin drip and initiated on SSIP and Levemir coverage.  Her chest tubes and arterial lines were removed without difficulty.  Post operative EKG demonstrated NSR. She went into atrial fibrillation on POD 3 and we started an Amio bolus and drip. POD 4 we switched her Amio drip to PO pills. She remained in NSR and unless she has another episode she should not need any anticoagulation. We added back half her dose of valsartan for better blood pressure control as well as increased her metoprolol for better heart rate control. She was encouraged to ambulate several times per day and use her incentive spirometer. Today, she is in normal sinus rhythm, tolerating room air, her incisions are healing well, her blood pressure is well controlled and she is ready for discharge home.

## 2020-08-10 NOTE — Anesthesia Postprocedure Evaluation (Signed)
Anesthesia Post Note  Patient: Megan Galvan  Procedure(s) Performed: AORTIC VALVE REPLACEMENT (AVR) USING INSPIRIS VALVE SIZE 21MM (N/A Chest) TRANSESOPHAGEAL ECHOCARDIOGRAM (TEE) (N/A )     Patient location during evaluation: SICU Anesthesia Type: General Level of consciousness: sedated and patient remains intubated per anesthesia plan Pain management: pain level controlled Vital Signs Assessment: post-procedure vital signs reviewed and stable Respiratory status: patient remains intubated per anesthesia plan and patient on ventilator - see flowsheet for VS Cardiovascular status: stable Anesthetic complications: no   No complications documented.  Last Vitals:  Vitals:   08/10/20 1430 08/10/20 1515  BP: (!) 100/49   Pulse: 80   Resp: 12   Temp: (!) 36 C   SpO2: 100% 100%    Last Pain:  Vitals:   08/10/20 0610  TempSrc:   PainSc: 0-No pain   Pain Goal:                   Megan Galvan

## 2020-08-11 ENCOUNTER — Encounter (HOSPITAL_COMMUNITY): Payer: Self-pay | Admitting: Thoracic Surgery (Cardiothoracic Vascular Surgery)

## 2020-08-11 ENCOUNTER — Inpatient Hospital Stay (HOSPITAL_COMMUNITY): Payer: Medicare Other

## 2020-08-11 LAB — BASIC METABOLIC PANEL
Anion gap: 5 (ref 5–15)
Anion gap: 3 — ABNORMAL LOW (ref 5–15)
BUN: 9 mg/dL (ref 8–23)
BUN: 9 mg/dL (ref 8–23)
CO2: 28 mmol/L (ref 22–32)
CO2: 32 mmol/L (ref 22–32)
Calcium: 7.7 mg/dL — ABNORMAL LOW (ref 8.9–10.3)
Calcium: 7.9 mg/dL — ABNORMAL LOW (ref 8.9–10.3)
Chloride: 104 mmol/L (ref 98–111)
Chloride: 99 mmol/L (ref 98–111)
Creatinine, Ser: 0.68 mg/dL (ref 0.44–1.00)
Creatinine, Ser: 0.71 mg/dL (ref 0.44–1.00)
GFR, Estimated: >60 mL/min (ref >60)
GFR, Estimated: >60 mL/min (ref >60)
Glucose, Bld: 137 mg/dL — ABNORMAL HIGH (ref 70–99)
Glucose, Bld: 132 mg/dL — ABNORMAL HIGH (ref 70–99)
Potassium: 3.7 mmol/L (ref 3.5–5.1)
Potassium: 3.5 mmol/L (ref 3.5–5.1)
Sodium: 137 mmol/L (ref 135–145)
Sodium: 134 mmol/L — ABNORMAL LOW (ref 135–145)

## 2020-08-11 LAB — CBC
HCT: 29.5 % — ABNORMAL LOW (ref 36.0–46.0)
HCT: 30.4 % — ABNORMAL LOW (ref 36.0–46.0)
Hemoglobin: 10.1 g/dL — ABNORMAL LOW (ref 12.0–15.0)
Hemoglobin: 10.1 g/dL — ABNORMAL LOW (ref 12.0–15.0)
MCH: 33.1 pg (ref 26.0–34.0)
MCH: 32.7 pg (ref 26.0–34.0)
MCHC: 34.2 g/dL (ref 30.0–36.0)
MCHC: 33.2 g/dL (ref 30.0–36.0)
MCV: 96.7 fL (ref 80.0–100.0)
MCV: 98.4 fL (ref 80.0–100.0)
Platelets: 122 10*3/uL — ABNORMAL LOW (ref 150–400)
Platelets: 113 10*3/uL — ABNORMAL LOW (ref 150–400)
RBC: 3.05 MIL/uL — ABNORMAL LOW (ref 3.87–5.11)
RBC: 3.09 MIL/uL — ABNORMAL LOW (ref 3.87–5.11)
RDW: 15.5 % (ref 11.5–15.5)
RDW: 15.5 % (ref 11.5–15.5)
WBC: 13.2 10*3/uL — ABNORMAL HIGH (ref 4.0–10.5)
WBC: 14.5 10*3/uL — ABNORMAL HIGH (ref 4.0–10.5)
nRBC: 0 % (ref 0.0–0.2)
nRBC: 0 % (ref 0.0–0.2)

## 2020-08-11 LAB — GLUCOSE, CAPILLARY
Glucose-Capillary: 123 mg/dL — ABNORMAL HIGH (ref 70–99)
Glucose-Capillary: 129 mg/dL — ABNORMAL HIGH (ref 70–99)
Glucose-Capillary: 134 mg/dL — ABNORMAL HIGH (ref 70–99)
Glucose-Capillary: 136 mg/dL — ABNORMAL HIGH (ref 70–99)
Glucose-Capillary: 137 mg/dL — ABNORMAL HIGH (ref 70–99)
Glucose-Capillary: 148 mg/dL — ABNORMAL HIGH (ref 70–99)
Glucose-Capillary: 163 mg/dL — ABNORMAL HIGH (ref 70–99)
Glucose-Capillary: 178 mg/dL — ABNORMAL HIGH (ref 70–99)
Glucose-Capillary: 96 mg/dL (ref 70–99)

## 2020-08-11 LAB — POTASSIUM: Potassium: 3.8 mmol/L (ref 3.5–5.1)

## 2020-08-11 LAB — MAGNESIUM
Magnesium: 2.5 mg/dL — ABNORMAL HIGH (ref 1.7–2.4)
Magnesium: 2.4 mg/dL (ref 1.7–2.4)

## 2020-08-11 MED ORDER — PRAVASTATIN SODIUM 40 MG PO TABS
40.0000 mg | ORAL_TABLET | Freq: Every day | ORAL | Status: DC
  Administered 2020-08-13 – 2020-08-14 (×2): 40 mg via ORAL
  Filled 2020-08-11 (×2): qty 1

## 2020-08-11 MED ORDER — INSULIN ASPART 100 UNIT/ML ~~LOC~~ SOLN
0.0000 [IU] | SUBCUTANEOUS | Status: DC
  Administered 2020-08-11: 4 [IU] via SUBCUTANEOUS
  Administered 2020-08-11: 2 [IU] via SUBCUTANEOUS
  Administered 2020-08-11 – 2020-08-12 (×2): 4 [IU] via SUBCUTANEOUS
  Administered 2020-08-12: 8 [IU] via SUBCUTANEOUS
  Administered 2020-08-12: 2 [IU] via SUBCUTANEOUS
  Administered 2020-08-12: 4 [IU] via SUBCUTANEOUS
  Administered 2020-08-12 (×2): 2 [IU] via SUBCUTANEOUS
  Administered 2020-08-13: 4 [IU] via SUBCUTANEOUS
  Administered 2020-08-13 (×5): 2 [IU] via SUBCUTANEOUS
  Administered 2020-08-13: 12 [IU] via SUBCUTANEOUS
  Administered 2020-08-14: 8 [IU] via SUBCUTANEOUS
  Administered 2020-08-14 – 2020-08-15 (×2): 2 [IU] via SUBCUTANEOUS

## 2020-08-11 MED ORDER — FUROSEMIDE 10 MG/ML IJ SOLN
20.0000 mg | Freq: Two times a day (BID) | INTRAMUSCULAR | Status: AC
  Administered 2020-08-11 – 2020-08-12 (×3): 20 mg via INTRAVENOUS
  Filled 2020-08-11 (×3): qty 2

## 2020-08-11 MED ORDER — POTASSIUM CHLORIDE CRYS ER 20 MEQ PO TBCR
20.0000 meq | EXTENDED_RELEASE_TABLET | ORAL | Status: AC
Start: 2020-08-11 — End: 2020-08-12
  Administered 2020-08-11 – 2020-08-12 (×3): 20 meq via ORAL
  Filled 2020-08-11 (×3): qty 1

## 2020-08-11 MED ORDER — ENOXAPARIN SODIUM 30 MG/0.3ML ~~LOC~~ SOLN
30.0000 mg | Freq: Every day | SUBCUTANEOUS | Status: DC
  Administered 2020-08-12 – 2020-08-14 (×3): 30 mg via SUBCUTANEOUS
  Filled 2020-08-11 (×3): qty 0.3

## 2020-08-11 MED ORDER — LORATADINE 10 MG PO TABS
10.0000 mg | ORAL_TABLET | Freq: Every day | ORAL | Status: DC
  Administered 2020-08-13 – 2020-08-15 (×3): 10 mg via ORAL
  Filled 2020-08-11 (×3): qty 1

## 2020-08-11 MED ORDER — POTASSIUM CHLORIDE 10 MEQ/50ML IV SOLN
10.0000 meq | INTRAVENOUS | Status: AC
  Administered 2020-08-11 (×3): 10 meq via INTRAVENOUS
  Filled 2020-08-11 (×3): qty 50

## 2020-08-11 MED ORDER — MELATONIN 5 MG PO TABS
5.0000 mg | ORAL_TABLET | Freq: Every day | ORAL | Status: DC
  Administered 2020-08-12 – 2020-08-14 (×3): 5 mg via ORAL
  Filled 2020-08-11 (×3): qty 1

## 2020-08-11 MED ORDER — POTASSIUM CHLORIDE CRYS ER 20 MEQ PO TBCR
20.0000 meq | EXTENDED_RELEASE_TABLET | ORAL | Status: DC
Start: 2020-08-11 — End: 2020-08-11

## 2020-08-11 NOTE — Progress Notes (Addendum)
[facility] DAILY ICU PROGRESS NOTE                   [address]            [place] [address]          [phone]   1 Day Post-Op Procedure(s) (LRB): AORTIC VALVE REPLACEMENT (AVR) USING INSPIRIS VALVE SIZE 21MM (N/A) TRANSESOPHAGEAL ECHOCARDIOGRAM (TEE) (N/A)  Total Length of Stay:  LOS: 1 day   Subjective:  Patient with mild pain.  Denies N/V.  Sleepy  Objective: Vital signs in last 24 hours: Temp:  [96.62 F (35.9 C)-99.32 F (37.4 C)] 98.96 F (37.2 C) (04/15 0700) Pulse Rate:  [78-80] 80 (04/15 0700) Cardiac Rhythm: Atrial paced (04/14 2000) Resp:  [10-25] 11 (04/15 0700) BP: (100-145)/(37-65) 119/51 (04/15 0700) SpO2:  [95 %-100 %] 96 % (04/15 0700) Arterial Line BP: (96-186)/(34-70) 155/60 (04/15 0700) FiO2 (%):  [36 %-50 %] 36 % (04/14 1833) Weight:  [97.5 kg] 97.5 kg (04/15 0500)  Filed Weights   08/10/20 0546 08/11/20 0500  Weight: 93.4 kg 97.5 kg    Weight change: 4.059 kg   Hemodynamic parameters for last 24 hours: PAP: (18-46)/(9-25) 39/20 CO:  [2.9 L/min-5.9 L/min] 5.2 L/min CI:  [1.4 L/min/m2-2.9 L/min/m2] 2.6 L/min/m2  Intake/Output from previous day: 04/14 0701 - 04/15 0700 In: 4417.6 [I.V.:2251.4; Blood:469; IV Piggyback:1697.3] Out: 2952 [Urine:3465; Blood:550; Chest Tube:310]  Current Meds: Scheduled Meds: . acetaminophen  1,000 mg Oral Q6H  . aspirin EC  325 mg Oral Daily  . bisacodyl  10 mg Oral Daily   Or  . bisacodyl  10 mg Rectal Daily  . Chlorhexidine Gluconate Cloth  6 each Topical Daily  . docusate sodium  200 mg Oral Daily  . [START ON 08/12/2020] enoxaparin (LOVENOX) injection  30 mg Subcutaneous QHS  . insulin aspart  0-24 Units Subcutaneous Q4H  . [START ON 08/13/2020] loratadine  10 mg Oral Daily  . [START ON 08/12/2020] melatonin  5 mg Oral QHS  . [START ON 08/12/2020] pantoprazole  40 mg Oral Daily  . [START ON 08/13/2020] pravastatin  40 mg Oral QHS  . sodium chloride flush  3 mL Intravenous Q12H    Continuous Infusions: . sodium chloride    . albumin human 12.5 g (08/10/20 1408)  . lactated ringers    . lactated ringers 20 mL/hr at 08/10/20 1215  . levofloxacin (LEVAQUIN) IV    . potassium chloride     PRN Meds:.albumin human, dextrose, fluticasone, hydrocortisone, morphine injection, ondansetron (ZOFRAN) IV, oxyCODONE, sodium chloride flush, traMADol  General appearance: alert, cooperative and no distress Heart: regular rate and rhythm Lungs: clear to auscultation bilaterally Abdomen: soft, non-tender; bowel sounds normal; no masses,  no organomegaly Extremities: edema trace Wound: aquacel in place  Lab Results: CBC: Recent Labs    08/10/20 1800 08/10/20 1801 08/10/20 2020 08/11/20 0520  WBC 14.2*  --   --  13.2*  HGB 10.4*   < > 10.9* 10.1*  HCT 31.0*   < > 32.0* 29.5*  PLT 124*  --   --  122*   < > = values in this interval not displayed.   BMET:  Recent Labs    08/10/20 1800 08/10/20 1801 08/10/20 2020 08/10/20 2156 08/11/20 0520  NA 138   < > 139  --  137  K 3.5   < > 3.3* 3.3* 3.7  CL 106  --   --   --  104  CO2 27  --   --   --  28  GLUCOSE 117*  --   --   --  137*  BUN 11  --   --   --  9  CREATININE 0.77  --   --   --  0.68  CALCIUM 7.5*  --   --   --  7.7*   < > = values in this interval not displayed.    CMET: Lab Results  Component Value Date   WBC 13.2 (H) 08/11/2020   HGB 10.1 (L) 08/11/2020   HCT 29.5 (L) 08/11/2020   PLT 122 (L) 08/11/2020   GLUCOSE 137 (H) 08/11/2020   CHOL 116 05/09/2020   TRIG 107 05/09/2020   HDL 45 05/09/2020   LDLCALC 51 05/09/2020   ALT 24 08/08/2020   AST 30 08/08/2020   NA 137 08/11/2020   K 3.7 08/11/2020   CL 104 08/11/2020   CREATININE 0.68 08/11/2020   BUN 9 08/11/2020   CO2 28 08/11/2020   INR 1.5 (H) 08/10/2020   HGBA1C 6.5 (H) 08/08/2020   MICROALBUR 10 05/09/2020      PT/INR:  Recent Labs    08/10/20 1236  LABPROT 17.9*  INR 1.5*   Radiology: [facility] Chest Port 1 View  Result  Date: 08/10/2020 CLINICAL DATA:  Status post aortic valve replacement. EXAM: PORTABLE CHEST 1 VIEW COMPARISON:  August 08, 2020. FINDINGS: Endotracheal and nasogastric tubes are unchanged in position. Right internal jugular Swan-Ganz catheter is noted with tip directed into right pulmonary artery. No pneumothorax or pleural effusion is noted. Bony thorax is unremarkable. IMPRESSION: Endotracheal and nasogastric tubes are in grossly good position. No pneumothorax is noted. Electronically Signed   By: Marijo Conception M.D.   On: 08/10/2020 12:50   ECHO INTRAOPERATIVE TEE  Result Date: 08/10/2020  *INTRAOPERATIVE TRANSESOPHAGEAL REPORT *  Patient Name:   Megan Galvan Date of Exam: 08/10/2020 Medical Rec #:  [id number]       Height:       66.0 in Accession #:    [id number]      Weight:       206.0 lb Date of Birth:  01-13-1951       BSA:          2.03 m Patient Age:    70 years        BP:           150/50 mmHg Patient Gender: F               HR:           49 bpm. Exam Location:  Inpatient Transesophogeal exam was perform intraoperatively during surgical procedure. Patient was closely monitored under general anesthesia during the entirety of examination. Indications:     aortic stenosis Sonographer:     Johny Chess RDCS Performing Phys: [place] Diagnosing Phys: Roberts Gaudy MD PROCEDURE: Intraoperative Transesophogeal No difficulty noted with probe insertion. Complications: No known complications during this procedure. PRE-OP FINDINGS  Left Ventricle: The left ventricle has normal systolic function, with an ejection fraction of 60-65%. There was moderate concentric LV hypertrophy. The LV wall thickness was 1.25-1.30 cm at end-diastole at the mid-papillary level. The ejection fraction was 60-65% using the 2D Simpson's method in the 4 and 2 chamber views. There were no regional wall motion abnormalities. On the post-bypass exam, the LV systolic function was unchanged fro the pre-bypass exam. Right  Ventricle: The right ventricle has  normal systolic function. The cavity was normal. There is no increase in right ventricular wall thickness. The TAPSE was 2.15 cm. Left Atrium: Left atrial size was dilated. No left atrial/left atrial appendage thrombus was detected. Right Atrium: Right atrial size was normal in size. Interatrial Septum: There was a small patent foramen ovale seen with color Doppler. This was located at the superior aspect of the fossa ovalis. There was left to right shunting. Pericardium: There is no evidence of pericardial effusion. Mitral Valve: The mitral valve is normal in structure. Mitral valve regurgitation is mild by color flow Doppler. The MR jet is centrally-directed. There is No evidence of mitral stenosis. Tricuspid Valve: The tricuspid valve was normal in structure. Tricuspid valve regurgitation is mild by color flow Doppler. No evidence of tricuspid stenosis is present. Aortic Valve: There was severe aortic stenosis and mild/moderate aortic insufficiency. 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 The peak velocity was 4.36 m/sec. with a peak gradient of 76 mm hg and a mean gradient of 39 mm hg. The AVA by Vmax was 0.59 cm2 and 0.56 by the VTI method. The ratio of the LVOT VTI/AV VTI was 0.147. There was an eccentric jet of aortic insufficiency. The AI PHT was 556 ms. The aortic annulus diameter measured 2.25 cm. On the post-bypass exam, there was a bioprosthetic valve in the aortic position. The leaflets opened normally and there was no aortic insufficiency. The mean trans-aortic gradient was 6 mm hg. The ratio of the LVOT VTI/AV VTI was 0.55.  Pulmonic Valve: The pulmonic valve was normal in structure, with normal. No evidence of pumonic stenosis. Pulmonic valve regurgitation is not visualized by color flow Doppler. Aorta: The aortic root and proximal ascending aorta were normal in diameter with a well defined  sino-tubular junction without effacement. There was intimal thickening and no protruding atheromatous disease. Aortic root: 3.06 cm STJ: 2.31 cm Proximal ascending aorta: 2.83 cm The descending aorta measured 2.15 cm in diameter. There was mild intimal thickening. +--------------+--------++ LEFT VENTRICLE         +--------------+--------++ PLAX 2D                +--------------+--------++ LVIDd:        4.70 cm  +--------------+--------++ LVIDs:        3.00 cm  +--------------+--------++ LVOT diam:    2.20 cm  +--------------+--------++ LV SV:        67 ml    +--------------+--------++ LV SV Index:  31.77    +--------------+--------++ LVOT Area:    3.80 cm +--------------+--------++                        +--------------+--------++  +------------------+---------++ LV Volumes (MOD)            +------------------+---------++ LV area d, A2C:   25.80 cm +------------------+---------++ LV area d, A4C:   31.20 cm +------------------+---------++ LV area s, A2C:   16.70 cm +------------------+---------++ LV area s, A4C:   17.70 cm +------------------+---------++ LV major d, A2C:  7.68 cm   +------------------+---------++ LV major d, A4C:  7.88 cm   +------------------+---------++ LV major s, A2C:  7.13 cm   +------------------+---------++ LV major s, A4C:  7.24 cm   +------------------+---------++ LV vol d, MOD A2C:73.0 ml   +------------------+---------++ LV vol d, MOD A4C:101.0 ml  +------------------+---------++ LV vol s, MOD A2C:32.9 ml   +------------------+---------++ LV vol s, MOD A4C:35.7 ml   +------------------+---------++ LV  SV MOD A2C:    40.1 ml   +------------------+---------++ LV SV MOD A4C:    101.0 ml  +------------------+---------++ LV SV MOD BP:     51.6 ml   +------------------+---------++ +------------------+------------++ AORTIC VALVE                   +------------------+------------++ AV Area  (Vmax):   0.59 cm     +------------------+------------++ AV Area (Vmean):               +------------------+------------++ AV Area (VTI):    0.56 cm     +------------------+------------++ AV Vmax:          436 cm/s     +------------------+------------++ AV Vmean:         225.333 cm/s +------------------+------------++ AV VTI:           1.11 m       +------------------+------------++ AV Peak Grad:     76 mmHg      +------------------+------------++ AV Mean Grad:     39 mmHg      +------------------+------------++ LVOT Vmax:        91.50 cm/s   +------------------+------------++ LVOT Vmean:       56.100 cm/s  +------------------+------------++ LVOT VTI:         0.164 m      +------------------+------------++ LVOT/AV VTI ratio:0.147        +------------------+------------++ AR PHT:           556 msec     +------------------+------------++  +--------------+-------+ SHUNTS                +--------------+-------+ Systemic VTI: 0.18 m  +--------------+-------+ Systemic Diam:2.20 cm +--------------+-------+  Roberts Gaudy MD Electronically signed by Roberts Gaudy MD Signature Date/Time: 08/10/2020/7:31:07 PM    Final      Assessment/Plan: S/P Procedure(s) (LRB): AORTIC VALVE REPLACEMENT (AVR) USING INSPIRIS VALVE SIZE 21MM (N/A) TRANSESOPHAGEAL ECHOCARDIOGRAM (TEE) (N/A)  1. CV- NSR, + HTN per arterial line.. per nurse this has been significantly higher than her cough pressure, she is currently on 30 of NTG 2. Pulm- no acute issues, CT output 310 cc since surgery, CXR with atelectasis, start IS use today 3. Renal- creatinine WNL, K is at 3.7, weight is elevated, may benefit from initiation of IV Lasix today 4. Expected post operative blood loss anemia, mild Hgb at 10.1 monitor 5. Mild Post Operative Expected Thrombocytopenia, monitor 6. DM- sugars controlled, wean off insulin drip, initiate Levemir and SSIP 7. Dispo- patient stable, in NSR, requiring  some IV NTG for HTN, CT output low, POD #1 progression orders per Dr. Geraldo Docker, PA-C 08/11/2020 7:13 AM    I have seen and examined the patient and agree with the assessment and plan as outlined.  Overall doing very well POD1.  Maintaining NSR w/ stable hemodynamics on IV NTG for HTN.  Breathing comfortably on nasal cannula and CXR looks good.  Continue routine early postop.  Start diuresis.  Restart Diovan 1-2 days if renal function stable and patient remains hypertensive  Rexene Alberts, MD 08/11/2020 8:08 AM

## 2020-08-11 NOTE — Progress Notes (Signed)
T[facility] Evening Rounds  POD #1 s/p AVR Mild incisional pain No other c/o BP 132/64   Pulse 78   Temp 98.5 F (36.9 C) (Oral)   Resp 18   Ht 5\' 6"  (1.676 m)   Wt 97.5 kg   SpO2 98%   BMI 34.69 kg/m  Alert/oriented CTA RRR Good uop  Intake/Output Summary (Last 24 hours) at 08/11/2020 1606 Last data filed at 08/11/2020 1500 Gross per 24 hour  Intake 1224.38 ml  Output 2495 ml  Net -1270.62 ml   A/P: continue routine post op management Heavyn Yearsley Z. Orvan Seen, [place]

## 2020-08-11 NOTE — Discharge Summary (Signed)
Megan Galvan 411       Megan Galvan,Megan Galvan [id number]             [phone]    Physician Discharge Summary  Patient ID: Megan Galvan MRN: [id number] DOB/AGE: Oct 09, 1950 70 y.o.  Admit date: 08/10/2020 Discharge date: 08/15/2020  Admission Diagnoses:  Patient Active Problem List   Diagnosis Date Noted  . Sleep apnea   . Plantar fasciitis   . Nonrheumatic aortic (valve) stenosis   . Hypertension   . Hyperlipidemia   . Heart murmur   . GERD (gastroesophageal reflux disease)   . Diabetes mellitus without complication ([place])   . Cataract   . Bone spur of acromioclavicular joint, left   . Arthritis   . Anemia   . Allergy   . Medicare annual wellness visit, subsequent 03/27/2020  . Essential hypertension 10/01/2019  . Mixed hyperlipidemia   . Vertigo 07/20/2019  . Myalgia, other site 06/07/2019  . Class 1 obesity due to excess calories with serious comorbidity and body mass index (BMI) of 34.0 to 34.9 in adult 06/07/2019  . BMI 35.0-35.9,adult 06/07/2019  . Gastroesophageal reflux disease without esophagitis 06/03/2019  . Type 2 diabetes mellitus with diabetic polyneuropathy ([place]) 06/03/2019  . Primary insomnia 06/03/2019   Discharge Diagnoses:  Patient Active Problem List   Diagnosis Date Noted  . S/P aortic valve replacement with bioprosthetic valve 08/10/2020  . S/P AVR (aortic valve replacement) 08/10/2020  . Sleep apnea   . Plantar fasciitis   . Nonrheumatic aortic (valve) stenosis   . Hypertension   . Hyperlipidemia   . Heart murmur   . GERD (gastroesophageal reflux disease)   . Diabetes mellitus without complication ([place])   . Cataract   . Bone spur of acromioclavicular joint, left   . Arthritis   . Anemia   . Allergy   . Medicare annual wellness visit, subsequent 03/27/2020  . Essential hypertension 10/01/2019  . Mixed hyperlipidemia   . Vertigo 07/20/2019  . Myalgia, other site 06/07/2019  . Class 1 obesity due to excess calories with serious  comorbidity and body mass index (BMI) of 34.0 to 34.9 in adult 06/07/2019  . BMI 35.0-35.9,adult 06/07/2019  . Gastroesophageal reflux disease without esophagitis 06/03/2019  . Type 2 diabetes mellitus with diabetic polyneuropathy ([place]) 06/03/2019  . Primary insomnia 06/03/2019   Discharged Condition: good  Consults: None   History of Present Illness:  Patient is 70 year old moderately obese female with longstanding history of heart murmur, hypertension, hyperlipidemia, and type 2 diabetes mellitus who has been referred for surgical consultation to discuss treatment options for recently discovered bicuspid aortic valve with critical aortic stenosis.   Patient states that she has been told that she had a heart murmur all of her life.  She has never been formally evaluated by cardiologist until recently.  She describes a 1 year history of progressive symptoms of exertional shortness of breath and chest tightness which has gotten much worse over the past 4 to 6 weeks.  She now gets short of breath and tightness across her chest with very low level activity and occasionally at rest.  She has had episodes of nocturnal shortness of breath and she cannot lay flat in bed.  She has had some mild dizzy spells without syncope.  She was evaluated by her primary care physician and underwent a transthoracic echocardiogram at [facility] which revealed findings suggestive of bicuspid aortic valve with severe aortic stenosis.  She  was evaluated by Dr. Bettina Gavia and subsequently referred to our multidisciplinary heart valve clinic.  She underwent diagnostic cardiac catheterization by Dr. Burt Knack on July 27, 2020 confirming the presence of severe aortic stenosis.  The patient was noted to have no significant coronary artery disease and mildly elevated right heart pressures.  CT angiography was performed earlier today and the patient referred for surgical consultation.  She was evaluated by Dr. Roxy Manns  at which time patient admitted to experiencing a 1 year history of progressive symptoms of exertional shortness of breath which is gotten particularly severe over the past 4 to 6 weeks.  She has remained functionally independent although she does not exercise or walk on a regular basis.  She has had some problems with both cervical and lumbar degenerative disc disease but she still gets around fairly well without significant physical limitations.  It was felt the patient would benefit from surgical intervention on her Aortic Valve.  The risks and benefits of the procedure were explained to the patient and she was agreeable to proceed.  Galvan Course:  [name] presented to [facility] on 08/10/2020.  She was taken to the operating room and underwent Aortic Valve replacement with a 21 mm Edwards Resilia Bioprosthetic tissue valve.  She tolerated the procedure without difficulty and was taken to the [facility] in stable condition.  The patient was extubated without difficulty.  The patient was hypertensive and required Megan Galvan drip.  This was weaned as oral antihypertensive agents were initiated.  She is a well controlled diabetic, she was transitioned off insulin drip and initiated on SSIP and Megan Galvan coverage.  Her chest tubes and arterial lines were removed without difficulty.  Post operative EKG demonstrated NSR. She went into atrial fibrillation on Megan Galvan 3 and we started an Megan Galvan bolus and drip. Megan Galvan 4 we switched her Megan Galvan drip to PO pills. She remained in NSR and unless she has another episode she should not need any anticoagulation. We added back half her dose of Megan Galvan for better blood pressure control as well as increased her Megan Galvan for better heart rate control. She was encouraged to ambulate several times per day and use her incentive spirometer. Today, she is in normal sinus rhythm, tolerating room air, her incisions are healing well, her blood pressure is well controlled and she is ready for  discharge home.    Significant Diagnostic Studies: angiography:   ECHOCARDIOGRAM:                 [place]                Cardiac Ultrasound-                [place]                [address]                [place] [address]         Transthoracic Echocardiogram Report Name: TALI, CLEAVES        Study Date: 07/18/2020   Height: 65 in MRN: [id number]  Weight: 209 lb DOB: [birth year]            Gender: Female       BSA: 2.0 m2 Age: 86 yrs              Ethnicity: W        BP: 127/58 mmHg Reason For Study: Chest pain; Chest pain, unspecified type    HR: 61 History: Chest Pain Ordering Physician: Ferol Luz  Performed By: Lynn Ito Referring Physician: SCOTT, [place] Image Quality: Technically adequate. - SUMMARY Left ventricular systolic function is normal. LV ejection fraction = 60-65%. There is moderate concentric left ventricular hypertrophy. The left atrium is mildly to moderately dilated. There is severe aortic stenosis. There is mild aortic regurgitation. Aortic valve mean pressure gradient is 67 mmHg. There is mild mitral regurgitation. There is no pericardial effusion. There is no comparison study available.  - FINDINGS: LEFT VENTRICLE The left ventricular size is normal. There is moderate concentric left ventricular hypertrophy. Left ventricular systolic function is normal. LV ejection fraction = 60-65%. Mitral inflow deceleration timeNormal>150 msec. The left ventricular wall motion is normal.  - RIGHT VENTRICLE The right ventricle is normal in size and function.  LEFT  ATRIUM The left atrium is mildly to moderately dilated.  RIGHT ATRIUM Right atrial size is normal. - AORTIC VALVE Diffuse calcification of the aortic valve. There is severe aortic stenosis. There is mild aortic regurgitation. Aortic valve peak pressure gradient is 110 mmHg. Aortic valve mean pressure gradient is 67 mmHg. The calculated aortic valve area using the continuity equation is 0.45 cm2. - MITRAL VALVE There is trivial mitral valve thickening. There is mild mitral regurgitation. - TRICUSPID VALVE Structurally normal tricuspid valve. There was insufficient TR detected to calculate RV systolic pressure. - PULMONIC VALVE Structurally normal pulmonic valve. Trace pulmonic valvular regurgitation. - ARTERIES The aortic sinus is normal size. The ascending aorta is normal size. - VENOUS IVC size was normal. - EFFUSION There is no pericardial effusion. Pericardial fat pad is noted. There is no pleural effusion.  Treatments: surgery:   Preoperative Diagnosis:      Severe Aortic Stenosis   Postoperative Diagnosis:    Same   Procedure:        Aortic Valve Replacement             Edwards Inspiris Resilia stented bovine pericardial tissue valve (size 21 mm, ref # [id number], serial # [id number])              Surgeon:        Valentina Gu. Roxy Manns, MD  Assistant:       Ellwood Handler, PA-C   Discharge Exam: Blood pressure (!) 177/65, pulse 98, temperature 98.3 F (36.8 C), temperature source Oral, resp. rate 18, height 5\' 6"  (1.676 m), weight 94.7 kg, SpO2 96 %.   General appearance: alert, cooperative and no distress Heart: regular rate and rhythm, S1, S2 normal, no murmur, click, rub or gallop Lungs: clear to auscultation bilaterally Abdomen: soft, non-tender; bowel sounds normal; no masses,  no organomegaly Extremities: extremities normal, atraumatic, no cyanosis or edema Wound: clean and dry   Discharge Medications:  The patient has been discharged on:   1.Beta  Blocker:  Yes [  yes ]                              No   [   ]  If No, reason:  2.Ace Inhibitor/ARB: Yes [ yes  ]                                     No  [    ]                                     If No, reason:  3.Statin:   Yes [ yes  ]                  No  [   ]                  If No, reason:  4.Ecasa:  Yes  [ yes  ]                  No   [   ]                  If No, reason:     Allergies as of 08/15/2020      Reactions   Ace Inhibitors Cough   Keflex [cephalexin] Itching   Latex    Burn and itch   Nexium [esomeprazole Magnesium] Diarrhea   Protonix [pantoprazole Sodium] Other (See Comments)   Constipation   Doxycycline Rash      Medication List    STOP taking these medications   Megan Galvan succinate 50 MG 24 hr tablet Commonly known as: TOPROL-XL   traZODone 100 MG tablet Commonly known as: DESYREL     TAKE these medications   Accu-Chek Aviva Plus test strip Generic drug: glucose blood CHECK BLOOD SUGAR ONCE  DAILY   Accu-Chek Softclix Lancets lancets Use as instructed   acetaminophen 500 MG tablet Commonly known as: TYLENOL Take 1,000 mg by mouth every 6 (six) hours as needed for moderate pain or headache.   ALLERGY EYE DROPS OP Place 1 drop into both eyes daily as needed (allergies).   amiodarone 200 MG tablet Commonly known as: PACERONE Take 1 tablet (200 mg total) by mouth 2 (two) times daily.   aspirin 325 MG EC tablet Take 1 tablet (325 mg total) by mouth daily. What changed:   medication strength  how much to take   CALCIUM 600 + D PO Take 2 tablets by mouth daily.   cetirizine 10 MG tablet Commonly known as: ZYRTEC Take 10 mg by mouth daily.   chlorthalidone 50 MG tablet Commonly known as: HYGROTON Take 1 tablet (50 mg total) by mouth daily.   famotidine 40 MG tablet Commonly known as: PEPCID TAKE 1 TABLET BY MOUTH AT BEDTIME   fluticasone 50 MCG/ACT nasal spray Commonly known as:  FLONASE Place 1 spray into the nose daily as needed for allergies.   hydrocortisone 25 MG suppository Commonly known as: ANUSOL-HC Place 1 suppository (25 mg total) rectally 2 (two) times daily. What changed:   when to take this  reasons to take this   linagliptin 5 MG Tabs tablet Commonly known as: Tradjenta Take 1 tablet (5 mg total) by mouth daily.   Melatonin 5 MG Caps Take 5 mg by mouth at bedtime.   Megan Galvan tartrate 25 MG tablet Commonly known as: LOPRESSOR Take 1 tablet (25 mg total) by mouth 2 (two) times daily.   multivitamin with minerals Tabs tablet Take 1  tablet by mouth daily.   omeprazole 40 MG capsule Commonly known as: PRILOSEC Take 1 capsule (40 mg total) by mouth daily.   oxyCODONE 5 MG immediate release tablet Commonly known as: Oxy IR/ROXICODONE Take 1 tablet (5 mg total) by mouth every 6 (six) hours as needed for severe pain.   potassium chloride SA 20 MEQ tablet Commonly known as: KLOR-CON 2 in am and 1 at night. What changed:   how much to take  how to take this  when to take this  additional instructions   pravastatin 40 MG tablet Commonly known as: PRAVACHOL Take 1 tablet (40 mg total) by mouth at bedtime.   Premarin vaginal cream Generic drug: conjugated estrogens Place 1 Applicatorful vaginally 2 (two) times a week.   PRESCRIPTION MEDICATION Apply 1 application topically 4 (four) times daily as needed (hemorrhoid). Nifedipine 5% ointment  Rx is sent to [facility]. Last called in on 07/19/2020 with 1 yr of refills.   Megan Galvan 320 MG tablet Commonly known as: DIOVAN Take 1 tablet (320 mg total) by mouth daily.       Follow-up Information    Rexene Alberts, MD Follow up on 09/11/2020.   Specialty: Cardiothoracic Surgery Why: Appointment is at 1:00, please get CXR at 12:30 at [facility] located on first floor of our office building Contact information: [place]  [address] [phone]        Richardo Priest, MD Follow up on 09/06/2020.   Specialties: Cardiology, Radiology Why: Appointment is at 10:20 Contact information: [place] [address] [phone]        Rochel Brome, MD. Call in 1 day(s).   Specialties: Family Medicine, Interventional Cardiology, Radiology, Anesthesiology Why: Please call for f/u within 1-2 weeks for diabetes.  Contact information: [place] [address] [place] [address] [phone]               Signed: Nicholes Rough, PA-C 08/15/2020, 8:14 AM

## 2020-08-11 NOTE — Progress Notes (Signed)
Pt placed on home CPAP.  

## 2020-08-11 NOTE — Progress Notes (Signed)
RT placed pt on home cpap unit.

## 2020-08-11 NOTE — Addendum Note (Signed)
Addendum  created 08/11/20 1957 by Roberts Gaudy, MD   Clinical Note Signed

## 2020-08-11 NOTE — Plan of Care (Signed)
  Problem: Clinical Measurements: Goal: Postoperative complications will be avoided or minimized Outcome: Progressing   Problem: Cardiac: Goal: Will achieve and/or maintain hemodynamic stability Outcome: Progressing   Problem: Education: Goal: Knowledge of disease or condition will improve Outcome: Progressing Goal: Knowledge of the prescribed therapeutic regimen will improve Outcome: Progressing   Problem: Elimination: Goal: Will not experience complications related to urinary retention Outcome: Progressing   Problem: Nutrition: Goal: Adequate nutrition will be maintained Outcome: Progressing   Problem: Activity: Goal: Risk for activity intolerance will decrease Outcome: Progressing

## 2020-08-11 NOTE — Progress Notes (Signed)
Anesthesiology Follow-up:  70 year old female one day S/P AVR with bioprosthetic valve for severe AS and mild/moderate AI.  Awake and alert, neuro intact, having some incisional pain, sitting in chair, hemodynamically stable.  VS: T-36.5 BP- 153/45 HR- 83 (SR) RR- 14 O2 Sat 98% on 3L Gem  K-3.5 BUN/Cr.- 9/0.71 H/H- 10.1/30.4 platelets- 113,000  Extubated 6 hours post-op. Doing well, no apparent complications.  Roberts Gaudy

## 2020-08-12 ENCOUNTER — Inpatient Hospital Stay (HOSPITAL_COMMUNITY): Payer: Medicare Other

## 2020-08-12 LAB — BPAM RBC
Blood Product Expiration Date: 202205152359
Blood Product Expiration Date: 202205152359
ISSUE DATE / TIME: 202204140820
ISSUE DATE / TIME: 202204140820
Unit Type and Rh: 5100
Unit Type and Rh: 5100

## 2020-08-12 LAB — TYPE AND SCREEN
ABO/RH(D): O POS
Antibody Screen: NEGATIVE
Unit division: 0
Unit division: 0

## 2020-08-12 LAB — CBC
HCT: 30.2 % — ABNORMAL LOW (ref 36.0–46.0)
Hemoglobin: 10.1 g/dL — ABNORMAL LOW (ref 12.0–15.0)
MCH: 32.5 pg (ref 26.0–34.0)
MCHC: 33.4 g/dL (ref 30.0–36.0)
MCV: 97.1 fL (ref 80.0–100.0)
Platelets: 127 10*3/uL — ABNORMAL LOW (ref 150–400)
RBC: 3.11 MIL/uL — ABNORMAL LOW (ref 3.87–5.11)
RDW: 15.6 % — ABNORMAL HIGH (ref 11.5–15.5)
WBC: 15.1 10*3/uL — ABNORMAL HIGH (ref 4.0–10.5)
nRBC: 0 % (ref 0.0–0.2)

## 2020-08-12 LAB — BASIC METABOLIC PANEL
Anion gap: 8 (ref 5–15)
BUN: 9 mg/dL (ref 8–23)
CO2: 32 mmol/L (ref 22–32)
Calcium: 8.3 mg/dL — ABNORMAL LOW (ref 8.9–10.3)
Chloride: 95 mmol/L — ABNORMAL LOW (ref 98–111)
Creatinine, Ser: 0.61 mg/dL (ref 0.44–1.00)
GFR, Estimated: >60 mL/min (ref >60)
Glucose, Bld: 161 mg/dL — ABNORMAL HIGH (ref 70–99)
Potassium: 3.7 mmol/L (ref 3.5–5.1)
Sodium: 135 mmol/L (ref 135–145)

## 2020-08-12 LAB — GLUCOSE, CAPILLARY
Glucose-Capillary: 144 mg/dL — ABNORMAL HIGH (ref 70–99)
Glucose-Capillary: 154 mg/dL — ABNORMAL HIGH (ref 70–99)
Glucose-Capillary: 156 mg/dL — ABNORMAL HIGH (ref 70–99)
Glucose-Capillary: 162 mg/dL — ABNORMAL HIGH (ref 70–99)
Glucose-Capillary: 224 mg/dL — ABNORMAL HIGH (ref 70–99)

## 2020-08-12 MED ORDER — METOPROLOL TARTRATE 12.5 MG HALF TABLET
12.5000 mg | ORAL_TABLET | Freq: Two times a day (BID) | ORAL | Status: DC
  Administered 2020-08-12 (×2): 12.5 mg via ORAL
  Filled 2020-08-12 (×2): qty 1

## 2020-08-12 MED ORDER — POTASSIUM CHLORIDE CRYS ER 20 MEQ PO TBCR
20.0000 meq | EXTENDED_RELEASE_TABLET | ORAL | Status: AC
  Administered 2020-08-12 (×3): 20 meq via ORAL
  Filled 2020-08-12 (×3): qty 1

## 2020-08-12 NOTE — Progress Notes (Signed)
Patient has home CPAP and mask at bedside.  RT plugged into a red outlet. Patient will notify RN when she is ready to apply CPAP for the night. RT will continue to monitor

## 2020-08-12 NOTE — Progress Notes (Signed)
2 Days Post-Op Procedure(s) (LRB): AORTIC VALVE REPLACEMENT (AVR) USING INSPIRIS VALVE SIZE 21MM (N/A) TRANSESOPHAGEAL ECHOCARDIOGRAM (TEE) (N/A) Subjective: Didn't sleep well Objective: Vital signs in last 24 hours: Temp:  [98.4 F (36.9 C)-99 F (37.2 C)] 98.4 F (36.9 C) (04/16 0743) Pulse Rate:  [71-94] 94 (04/16 0700) Cardiac Rhythm: Normal sinus rhythm (04/15 2000) Resp:  [13-23] 18 (04/16 0700) BP: (83-160)/(42-72) 147/53 (04/16 0700) SpO2:  [91 %-100 %] 93 % (04/16 0700) Arterial Line BP: (188)/(67) 188/67 (04/15 0800) Weight:  [96.2 kg] 96.2 kg (04/16 0500)  Hemodynamic parameters for last 24 hours: PAP: (38-41)/(19-25) 41/25 CVP:  [20 mmHg] 20 mmHg  Intake/Output from previous day: 04/15 0701 - 04/16 0700 In: 326.2 [I.V.:26.2; IV Piggyback:300] Out: 2560 [Urine:2375; Chest Tube:185] Intake/Output this shift: No intake/output data recorded.  General appearance: alert and cooperative Neurologic: intact Heart: regular rate and rhythm, S1, S2 normal, no murmur, click, rub or gallop Lungs: clear to auscultation bilaterally Abdomen: soft, non-tender; bowel sounds normal; no masses,  no organomegaly Extremities: edema 1+ Wound: c/d/i  Lab Results: Recent Labs    08/11/20 1618 08/12/20 0450  WBC 14.5* 15.1*  HGB 10.1* 10.1*  HCT 30.4* 30.2*  PLT 113* 127*   BMET:  Recent Labs    08/11/20 1618 08/12/20 0450  NA 134* 135  K 3.5 3.7  CL 99 95*  CO2 32 32  GLUCOSE 132* 161*  BUN 9 9  CREATININE 0.71 0.61  CALCIUM 7.9* 8.3*    PT/INR:  Recent Labs    08/10/20 1236  LABPROT 17.9*  INR 1.5*   ABG    Component Value Date/Time   PHART 7.327 (L) 08/10/2020 2020   HCO3 27.4 08/10/2020 2020   TCO2 29 08/10/2020 2020   ACIDBASEDEF 2.0 08/10/2020 1801   O2SAT 98.0 08/10/2020 2020   CBG (last 3)  Recent Labs    08/11/20 1930 08/12/20 0007 08/12/20 0731  GLUCAP 163* 154* 224*    Assessment/Plan: S/P Procedure(s) (LRB): AORTIC VALVE  REPLACEMENT (AVR) USING INSPIRIS VALVE SIZE 21MM (N/A) TRANSESOPHAGEAL ECHOCARDIOGRAM (TEE) (N/A) Mobilize Diuresis Plan for transfer to step-down: see transfer orders  Add beta-blocker   LOS: 2 days    Megan Galvan 08/12/2020

## 2020-08-12 NOTE — Progress Notes (Signed)
Mobility Specialist: Progress Note   08/12/20 1112  Mobility  Activity Ambulated in hall  Level of [place] wheel walker;None  Distance Ambulated (ft) 330 ft  Mobility Response Tolerated well  Mobility performed by Mobility specialist  Bed Position Chair  $Mobility charge 1 Mobility   Pre-Mobility: 88 HR, 97% SpO2 Post-Mobility: 86 HR, 167/68 BP, 97% SpO2  Pt to BR before ambulation, BM unsuccessful. Pt agreeable to ambulate after. Pt began ambulation with RW and halfway through walked independently back to room. Pt steady w/o AD. Pt c/o 5/10 chest pain during walk but otherwise asx. Pt to chair after walk to sit up and eat lunch.   [facility] Angle Karel Mobility Specialist Mobility Specialist Phone: [phone]

## 2020-08-13 ENCOUNTER — Inpatient Hospital Stay (HOSPITAL_COMMUNITY): Payer: Medicare Other

## 2020-08-13 LAB — CBC
HCT: 31.5 % — ABNORMAL LOW (ref 36.0–46.0)
Hemoglobin: 10.6 g/dL — ABNORMAL LOW (ref 12.0–15.0)
MCH: 32.7 pg (ref 26.0–34.0)
MCHC: 33.7 g/dL (ref 30.0–36.0)
MCV: 97.2 fL (ref 80.0–100.0)
Platelets: 193 10*3/uL (ref 150–400)
RBC: 3.24 MIL/uL — ABNORMAL LOW (ref 3.87–5.11)
RDW: 15.6 % — ABNORMAL HIGH (ref 11.5–15.5)
WBC: 12.1 10*3/uL — ABNORMAL HIGH (ref 4.0–10.5)
nRBC: 0 % (ref 0.0–0.2)

## 2020-08-13 LAB — BASIC METABOLIC PANEL
Anion gap: 10 (ref 5–15)
BUN: 12 mg/dL (ref 8–23)
CO2: 28 mmol/L (ref 22–32)
Calcium: 8.9 mg/dL (ref 8.9–10.3)
Chloride: 98 mmol/L (ref 98–111)
Creatinine, Ser: 0.69 mg/dL (ref 0.44–1.00)
GFR, Estimated: >60 mL/min (ref >60)
Glucose, Bld: 190 mg/dL — ABNORMAL HIGH (ref 70–99)
Potassium: 4 mmol/L (ref 3.5–5.1)
Sodium: 136 mmol/L (ref 135–145)

## 2020-08-13 LAB — GLUCOSE, CAPILLARY
Glucose-Capillary: 123 mg/dL — ABNORMAL HIGH (ref 70–99)
Glucose-Capillary: 137 mg/dL — ABNORMAL HIGH (ref 70–99)
Glucose-Capillary: 144 mg/dL — ABNORMAL HIGH (ref 70–99)
Glucose-Capillary: 181 mg/dL — ABNORMAL HIGH (ref 70–99)
Glucose-Capillary: 202 mg/dL — ABNORMAL HIGH (ref 70–99)

## 2020-08-13 MED ORDER — COLCHICINE 0.3 MG HALF TABLET
0.3000 mg | ORAL_TABLET | Freq: Two times a day (BID) | ORAL | Status: DC
  Administered 2020-08-13 – 2020-08-14 (×4): 0.3 mg via ORAL
  Filled 2020-08-13 (×5): qty 1

## 2020-08-13 MED ORDER — AMIODARONE HCL IN DEXTROSE 360-4.14 MG/200ML-% IV SOLN
60.0000 mg/h | INTRAVENOUS | Status: AC
  Administered 2020-08-13: 60 mg/h via INTRAVENOUS
  Filled 2020-08-13: qty 200

## 2020-08-13 MED ORDER — AMIODARONE HCL IN DEXTROSE 360-4.14 MG/200ML-% IV SOLN
30.0000 mg/h | INTRAVENOUS | Status: DC
  Administered 2020-08-13: 30 mg/h via INTRAVENOUS
  Filled 2020-08-13 (×2): qty 200

## 2020-08-13 MED ORDER — DIPHENHYDRAMINE HCL 25 MG PO CAPS
25.0000 mg | ORAL_CAPSULE | Freq: Every evening | ORAL | Status: DC | PRN
  Administered 2020-08-13: 25 mg via ORAL
  Filled 2020-08-13: qty 1

## 2020-08-13 MED ORDER — METOPROLOL TARTRATE 25 MG PO TABS
25.0000 mg | ORAL_TABLET | Freq: Two times a day (BID) | ORAL | Status: DC
  Administered 2020-08-13 – 2020-08-15 (×5): 25 mg via ORAL
  Filled 2020-08-13 (×5): qty 1

## 2020-08-13 MED ORDER — AMIODARONE LOAD VIA INFUSION
150.0000 mg | Freq: Once | INTRAVENOUS | Status: AC
  Administered 2020-08-13: 150 mg via INTRAVENOUS
  Filled 2020-08-13: qty 83.34

## 2020-08-13 NOTE — Progress Notes (Signed)
Foley removed at 10, pt  due to void at 1600.

## 2020-08-13 NOTE — Progress Notes (Signed)
Mobility Specialist: Progress Note   08/13/20 1234  Mobility  Activity Ambulated in hall  Level of Assistance Contact guard assist, steadying assist  Assistive Device None  Distance Ambulated (ft) 470 ft  Mobility Response Tolerated well  Mobility performed by Mobility specialist  Bed Position Chair  $Mobility charge 1 Mobility   Pre-Mobility: 65 HR, 97% SpO2 Post-Mobility: 75 HR, 143/56 BP, 99% SpO2  Pt felt much better today during ambulation. Pt ambulated holding her heart pillow today, rating her chest pain 2/10. Pt otherwise asx. Pt back to chair after walk per request.   Harrell Gave Myda Detwiler Mobility Specialist Mobility Specialist Phone: [phone]

## 2020-08-13 NOTE — Progress Notes (Addendum)
      [address]       [place] [address]             [phone]      3 Days Post-Op Procedure(s) (LRB): AORTIC VALVE REPLACEMENT (AVR) USING INSPIRIS VALVE SIZE 21MM (N/A) TRANSESOPHAGEAL ECHOCARDIOGRAM (TEE) (N/A) Subjective: Feels okay this morning, she is having some incisional pain.   Objective: Vital signs in last 24 hours: Temp:  [97.9 F (36.6 C)-99 F (37.2 C)] 98.9 F (37.2 C) (04/17 0400) Pulse Rate:  [84-126] 126 (04/17 0400) Cardiac Rhythm: Atrial fibrillation (04/17 0009) Resp:  [15-18] 18 (04/17 0400) BP: (128-168)/(51-76) 138/67 (04/17 0400) SpO2:  [93 %-96 %] 93 % (04/17 0400) Weight:  [92.6 kg] 92.6 kg (04/17 0652)     Intake/Output from previous day: 04/16 0701 - 04/17 0700 In: -  Out: 7544 [Urine:3420; Chest Tube:25] Intake/Output this shift: No intake/output data recorded.  General appearance: alert, cooperative and no distress Heart: irregularly irregular rhythm Lungs: clear to auscultation bilaterally Abdomen: soft, non-tender; bowel sounds normal; no masses,  no organomegaly Extremities: extremities normal, atraumatic, no cyanosis or edema Wound: clean and dry  Lab Results: Recent Labs    08/11/20 1618 08/12/20 0450  WBC 14.5* 15.1*  HGB 10.1* 10.1*  HCT 30.4* 30.2*  PLT 113* 127*   BMET:  Recent Labs    08/12/20 0450 08/13/20 0529  NA 135 136  K 3.7 4.0  CL 95* 98  CO2 32 28  GLUCOSE 161* 190*  BUN 9 12  CREATININE 0.61 0.69  CALCIUM 8.3* 8.9    PT/INR:  Recent Labs    08/10/20 1236  LABPROT 17.9*  INR 1.5*   ABG    Component Value Date/Time   PHART 7.327 (L) 08/10/2020 2020   HCO3 27.4 08/10/2020 2020   TCO2 29 08/10/2020 2020   ACIDBASEDEF 2.0 08/10/2020 1801   O2SAT 98.0 08/10/2020 2020   CBG (last 3)  Recent Labs    08/12/20 1659 08/12/20 2103 08/13/20 0654  GLUCAP 144* 156* 202*    Assessment/Plan: S/P Procedure(s) (LRB): AORTIC VALVE REPLACEMENT (AVR) USING INSPIRIS VALVE  SIZE 21MM (N/A) TRANSESOPHAGEAL ECHOCARDIOGRAM (TEE) (N/A)  1. CV- Afib early this morning, bolus and drip initiated. Increase BB to 25mg  BID.  2. Pulm- no acute issues, tolerating room air with good oxygen saturation  3. Renal- creatinine 0.69, potassium 4.0, weight is now below baseline, lasix discontinued 4. Expected post operative blood loss anemia, mild Hgb at 10.1/30.2, stable 5. Mild Post Operative Expected Thrombocytopenia, 127k 6. DM- sugars mostly controlled, continue Levemir and SSIP  Plan: Pacing wires are still in place, Afib early this morning with Amio protocol initiated. She is having some incisional pain but otherwise feels well. Encouraged to ambulate today and use incentive spirometer.     LOS: 3 days    Elgie Collard 08/13/2020 Pt seen and examined; agree with documentation. Cap wires; continue amiodarone. Hemodyn stable Zakaree Mcclenahan Z. Orvan Seen, Regino Ramirez

## 2020-08-13 NOTE — Plan of Care (Signed)
  Problem: Education: Goal: Knowledge of General Education information will improve Description Including pain rating scale, medication(s)/side effects and non-pharmacologic comfort measures Outcome: Progressing   

## 2020-08-13 NOTE — Progress Notes (Signed)
  Amiodarone Drug - Drug Interaction Consult Note  Recommendations: Monitor Amiodarone is metabolized by the cytochrome P450 system and therefore has the potential to cause many drug interactions. Amiodarone has an average plasma half-life of 50 days (range 20 to 100 days).   There is potential for drug interactions to occur several weeks or months after stopping treatment and the onset of drug interactions may be slow after initiating amiodarone.   [x]  Statins: Increased risk of myopathy. Counsel patients to report any muscle pain or weakness immediately.  Thank You,  Sherlon Handing, PharmD, BCPS Please see amion for complete clinical pharmacist phone list 08/13/2020 12:54 AM

## 2020-08-14 LAB — GLUCOSE, CAPILLARY
Glucose-Capillary: 162 mg/dL — ABNORMAL HIGH (ref 70–99)
Glucose-Capillary: 153 mg/dL — ABNORMAL HIGH (ref 70–99)
Glucose-Capillary: 239 mg/dL — ABNORMAL HIGH (ref 70–99)
Glucose-Capillary: 124 mg/dL — ABNORMAL HIGH (ref 70–99)
Glucose-Capillary: 110 mg/dL — ABNORMAL HIGH (ref 70–99)
Glucose-Capillary: 221 mg/dL — ABNORMAL HIGH (ref 70–99)

## 2020-08-14 LAB — SURGICAL PATHOLOGY

## 2020-08-14 MED ORDER — AMIODARONE HCL 200 MG PO TABS
400.0000 mg | ORAL_TABLET | Freq: Two times a day (BID) | ORAL | Status: DC
  Administered 2020-08-14 (×2): 400 mg via ORAL
  Filled 2020-08-14 (×2): qty 2

## 2020-08-14 MED ORDER — IRBESARTAN 150 MG PO TABS
150.0000 mg | ORAL_TABLET | Freq: Every day | ORAL | Status: DC
  Administered 2020-08-14: 150 mg via ORAL
  Filled 2020-08-14: qty 1

## 2020-08-14 MED FILL — Heparin Sodium (Porcine) Inj 1000 Unit/ML: INTRAMUSCULAR | Qty: 30 | Status: AC

## 2020-08-14 MED FILL — Lidocaine HCl Local Preservative Free (PF) Inj 2%: INTRAMUSCULAR | Qty: 15 | Status: AC

## 2020-08-14 MED FILL — Potassium Chloride Inj 2 mEq/ML: INTRAVENOUS | Qty: 40 | Status: AC

## 2020-08-14 NOTE — TOC Benefit Eligibility Note (Signed)
Transition of Care [facility]) Benefit Eligibility Note    Patient Details  Name: Megan Galvan MRN: [id number] Date of Birth: August 01, 1950   Medication/Dose: TRAJENTA  5 MG DAILY  Covered?: Yes  Tier: 3 Drug  Prescription Coverage [facility]: [facility]   and  [facility]  Spoke with Person/Company/Phone Number:: VINCENT  @ [facility] #  [phone]  Co-Pay: $ 47.00  Prior Approval: No  Deductible: Met (OUT-OF-POCKET:UNMET)  Additional Notes: JANUVIA  25 MG DAILY : COVER- YES  CO-PAY-$47.00 TIER- 3 DRUG   P/A-NO    Memory Argue Phone Number: 08/14/2020, 12:14 PM

## 2020-08-14 NOTE — Progress Notes (Signed)
Patient's home CPAP set up at bedside. Patient stated she will place herself on when ready. RT will monitor as needed.

## 2020-08-14 NOTE — Progress Notes (Addendum)
CARDIAC REHAB PHASE I   PRE:  Rate/Rhythm: 80 SR    BP: sitting 146/62    SaO2: 97 RA  MODE:  Ambulation: 470 ft   POST:  Rate/Rhythm: 102 ST    BP: sitting 184/66     SaO2: 96 RA  Pt stood independently and walked to BR for BM (loose). Cleaned herself then ambulated in hall independently. No c/o, steady. Tolerated very well. No Afib. To recliner after walk with elevated BP. Practiced IS, 1000-1200 mL. Pt can ambulate independently. [place], ACSM 08/14/2020 9:16 AM

## 2020-08-14 NOTE — Progress Notes (Signed)
Inpatient Diabetes Program Recommendations  AACE/ADA: New Consensus Statement on Inpatient Glycemic Control (2015)  Target Ranges:  Prepandial:   less than 140 mg/dL      Peak postprandial:   less than 180 mg/dL (1-2 hours)      Critically ill patients:  140 - 180 mg/dL   Lab Results  Component Value Date   GLUCAP 239 (H) 08/14/2020   HGBA1C 6.5 (H) 08/08/2020    Review of Glycemic Control Results for Megan Galvan, Megan Galvan (MRN [id number]) as of 08/14/2020 09:52  Ref. Range 08/13/2020 16:31 08/13/2020 20:51 08/13/2020 23:42 08/14/2020 04:46 08/14/2020 08:41  Glucose-Capillary Latest Ref Range: 70 - 99 mg/dL 144 (H) 123 (H) 137 (H) 153 (H) 239 (H)   Diabetes history: DM2 Outpatient Diabetes medications: None currently Current orders for Inpatient glycemic control: Novolog 0-24 units Q4H  Inpatient Diabetes Program Recommendations:    Please consider Novolog 0-24 units TID and HS as patient is eating.    Referral for DM coordinator "recommendations for DM home medication".  Spoke with patient at bedside.  She states she has not been taking any medications for diabetes since January.  She can not tolerate the GI upset from Metformin and Jardicance gave her yeast infections.  She checks her blood sugar at least 1 x a day.  Her meter reads 140-160 mg/dL.  She is current with her PCP.  Current A1C is 6.5% (average blood sugar of 140 mg/dL).  We discussed limiting CHO's and avoiding beverages with sugar.  Recommend DPP-4 at  discharge.  Tradjenta 5 mg daily or Januvia 25 mg daily depending on coverage.  Will ask TOC for benefit check.    Will continue to follow while inpatient.  Thank you, Reche Dixon, RN, BSN Diabetes Coordinator Inpatient Diabetes Program [phone] (team pager from 8a-5p)

## 2020-08-14 NOTE — Progress Notes (Signed)
Mobility Specialist - Progress Note   08/14/20 1152  Mobility  Activity Ambulated in hall  Level of Assistance Standby assist, set-up cues, supervision of patient - no hands on  Assistive Device None  Distance Ambulated (ft) 470 ft  Mobility Response Tolerated well  Mobility performed by Mobility specialist  $Mobility charge 1 Mobility   Pre-mobility: 66 HR, 142/55 BP During mobility: 100 HR Post-mobility: 67 HR, 159/68 BP  Pt asx throughout ambulation. Pt sitting on edge of bed after walk.   Pricilla Handler Mobility Specialist Mobility Specialist Phone: [phone]

## 2020-08-14 NOTE — Progress Notes (Addendum)
      [name]       [place] [address]             [phone]      4 Days Post-Op Procedure(s) (LRB): AORTIC VALVE REPLACEMENT (AVR) USING INSPIRIS VALVE SIZE 21MM (N/A) TRANSESOPHAGEAL ECHOCARDIOGRAM (TEE) (N/A) Subjective: Feels good this morning. She feels like she has trouble swallowing at times  Objective: Vital signs in last 24 hours: Temp:  [98 F (36.7 C)-98.4 F (36.9 C)] 98.1 F (36.7 C) (04/18 0440) Pulse Rate:  [74-102] 87 (04/18 0440) Cardiac Rhythm: Normal sinus rhythm (04/18 0459) Resp:  [16-20] 20 (04/18 0440) BP: (132-160)/(58-90) 160/58 (04/18 0440) SpO2:  [95 %-97 %] 96 % (04/18 0440) Weight:  [94.7 kg] 94.7 kg (04/18 0440)     Intake/Output from previous day: 04/17 0701 - 04/18 0700 In: 1962.9 [P.O.:1080; I.V.:882.9] Out: 900 [Urine:900] Intake/Output this shift: No intake/output data recorded.  General appearance: alert, cooperative and no distress Heart: regular rate and rhythm, S1, S2 normal, no murmur, click, rub or gallop Lungs: clear to auscultation bilaterally Abdomen: soft, non-tender; bowel sounds normal; no masses,  no organomegaly Extremities: extremities normal, atraumatic, no cyanosis or edema Wound: clean and dry  Lab Results: Recent Labs    08/12/20 0450 08/13/20 0921  WBC 15.1* 12.1*  HGB 10.1* 10.6*  HCT 30.2* 31.5*  PLT 127* 193   BMET:  Recent Labs    08/12/20 0450 08/13/20 0529  NA 135 136  K 3.7 4.0  CL 95* 98  CO2 32 28  GLUCOSE 161* 190*  BUN 9 12  CREATININE 0.61 0.69  CALCIUM 8.3* 8.9    PT/INR: No results for input(s): LABPROT, INR in the last 72 hours. ABG    Component Value Date/Time   PHART 7.327 (L) 08/10/2020 2020   HCO3 27.4 08/10/2020 2020   TCO2 29 08/10/2020 2020   ACIDBASEDEF 2.0 08/10/2020 1801   O2SAT 98.0 08/10/2020 2020   CBG (last 3)  Recent Labs    08/13/20 2051 08/13/20 2342 08/14/20 0446  GLUCAP 123* 137* 153*    Assessment/Plan: S/P  Procedure(s) (LRB): AORTIC VALVE REPLACEMENT (AVR) USING INSPIRIS VALVE SIZE 21MM (N/A) TRANSESOPHAGEAL ECHOCARDIOGRAM (TEE) (N/A)   1. CV-converted to NSR yesterday, remains on Amio drip will switch to PO. Continue BB at 25mg  BID. Add valsartan at half her home dose for hypertension.  2. Pulm- no acute issues, tolerating room air with good oxygen saturation  3. Renal- creatinine 0.69, potassium 4.0, weight is now below baseline, lasix discontinued 4. Expected post operative blood loss anemia, mild Hgb at 10.1/30.2, stable 5. Mild Post Operative Expected Thrombocytopenia, 127k 6. DM- sugars mostly controlled, continue Levemir and SSIP  Plan: Remove EPW, add valsartan for better blood pressure control. Switch to PO Amio. Hopefully for dc home tomorrow. Encourage ambulation in the halls and use of incentive spirometer.     LOS: 4 days    Megan Galvan 08/14/2020   I have seen and examined the patient and agree with the assessment and plan as outlined.  Rexene Alberts, MD 08/14/2020 8:19 AM

## 2020-08-15 ENCOUNTER — Other Ambulatory Visit: Payer: Self-pay | Admitting: Physician Assistant

## 2020-08-15 LAB — GLUCOSE, CAPILLARY
Glucose-Capillary: 131 mg/dL — ABNORMAL HIGH (ref 70–99)
Glucose-Capillary: 97 mg/dL (ref 70–99)

## 2020-08-15 MED ORDER — OXYCODONE HCL 5 MG PO TABS: 5.0000 mg | ORAL_TABLET | Freq: Four times a day (QID) | ORAL | 0 refills | Status: DC | PRN

## 2020-08-15 MED ORDER — FAMOTIDINE 20 MG PO TABS: 40.0000 mg | ORAL_TABLET | Freq: Two times a day (BID) | ORAL | Status: DC

## 2020-08-15 MED ORDER — AMIODARONE HCL 200 MG PO TABS
200.0000 mg | ORAL_TABLET | Freq: Two times a day (BID) | ORAL | Status: DC
  Administered 2020-08-15: 200 mg via ORAL
  Filled 2020-08-15: qty 1

## 2020-08-15 MED ORDER — AMIODARONE HCL 200 MG PO TABS: 200.0000 mg | ORAL_TABLET | Freq: Two times a day (BID) | ORAL | 1 refills | Status: DC

## 2020-08-15 MED ORDER — LINAGLIPTIN 5 MG PO TABS: 5.0000 mg | ORAL_TABLET | Freq: Every day | ORAL | 0 refills | Status: DC

## 2020-08-15 MED ORDER — CHLORTHALIDONE 25 MG PO TABS
25.0000 mg | ORAL_TABLET | Freq: Every day | ORAL | Status: DC
  Administered 2020-08-15: 25 mg via ORAL
  Filled 2020-08-15: qty 1

## 2020-08-15 MED ORDER — FAMOTIDINE 20 MG PO TABS: 40.0000 mg | ORAL_TABLET | Freq: Every day | ORAL | Status: DC

## 2020-08-15 MED ORDER — IRBESARTAN 300 MG PO TABS
300.0000 mg | ORAL_TABLET | Freq: Every day | ORAL | Status: DC
  Administered 2020-08-15: 300 mg via ORAL
  Filled 2020-08-15: qty 1

## 2020-08-15 MED ORDER — ASPIRIN 325 MG PO TBEC: 325.0000 mg | DELAYED_RELEASE_TABLET | Freq: Every day | ORAL | 0 refills | Status: DC

## 2020-08-15 MED ORDER — FE FUMARATE-B12-VIT C-FA-IFC PO CAPS
1.0000 | ORAL_CAPSULE | Freq: Every day | ORAL | Status: DC
  Administered 2020-08-15: 1 via ORAL
  Filled 2020-08-15: qty 1

## 2020-08-15 MED ORDER — METOPROLOL TARTRATE 25 MG PO TABS: 25.0000 mg | ORAL_TABLET | Freq: Two times a day (BID) | ORAL | 1 refills | Status: DC

## 2020-08-15 NOTE — Discharge Instructions (Signed)
Discharge Instructions:  1. You may shower, please wash incisions daily with soap and water and keep dry.  If you wish to cover wounds with dressing you may do so but please keep clean and change daily.  No tub baths or swimming until incisions have completely healed.  If your incisions become red or develop any drainage please call our office at [phone]  2. No Driving until cleared by Dr. Guy Sandifer office and you are no longer using narcotic pain medications  3. Monitor your weight daily.. Please use the same scale and weigh at same time... If you gain 3-5 lbs in 48 hours with associated lower extremity swelling, please contact our office at [phone]  4. Fever of 101.5 for at least 24 hours with no source, please contact our office at [phone]  5. Activity- up as tolerated, please walk at least 3 times per day.  Avoid strenuous activity, no lifting, pushing, or pulling with your arms over 8-10 lbs for a minimum of 6 weeks  6. If any questions or concerns arise, please do not hesitate to contact our office at [phone]

## 2020-08-15 NOTE — Care Management Important Message (Signed)
Important Message  Patient Details  Name: Megan Galvan MRN: [id number] Date of Birth: 01/24/1951   Medicare Important Message Given:  Yes     [name] 08/15/2020, 12:20 PM

## 2020-08-15 NOTE — Progress Notes (Signed)
Pt is d/c, husband here to take her home. Education provided, cardiac rehab education provided. No IV to remove, Telebox returned.  Chrisandra Carota, RN 08/15/2020 12:50 PM

## 2020-08-15 NOTE — Progress Notes (Addendum)
      [address]       [place] [address]             [phone]      5 Days Post-Op Procedure(s) (LRB): AORTIC VALVE REPLACEMENT (AVR) USING INSPIRIS VALVE SIZE 21MM (N/A) TRANSESOPHAGEAL ECHOCARDIOGRAM (TEE) (N/A) Subjective: Feels good this morning, no issues.   Objective: Vital signs in last 24 hours: Temp:  [97.8 F (36.6 C)-98.5 F (36.9 C)] 98.3 F (36.8 C) (04/19 0414) Pulse Rate:  [65-98] 98 (04/19 0414) Cardiac Rhythm: Normal sinus rhythm (04/19 0340) Resp:  [16-20] 18 (04/19 0540) BP: (135-177)/(57-68) 177/65 (04/19 0414) SpO2:  [96 %-100 %] 96 % (04/19 0540) Weight:  [94.7 kg] 94.7 kg (04/19 0414)     Intake/Output from previous day: No intake/output data recorded. Intake/Output this shift: No intake/output data recorded.  General appearance: alert, cooperative and no distress Heart: regular rate and rhythm, S1, S2 normal, no murmur, click, rub or gallop Lungs: clear to auscultation bilaterally Abdomen: soft, non-tender; bowel sounds normal; no masses,  no organomegaly Extremities: extremities normal, atraumatic, no cyanosis or edema Wound: clean and dry  Lab Results: Recent Labs    08/13/20 0921  WBC 12.1*  HGB 10.6*  HCT 31.5*  PLT 193   BMET:  Recent Labs    08/13/20 0529  NA 136  K 4.0  CL 98  CO2 28  GLUCOSE 190*  BUN 12  CREATININE 0.69  CALCIUM 8.9    PT/INR: No results for input(s): LABPROT, INR in the last 72 hours. ABG    Component Value Date/Time   PHART 7.327 (L) 08/10/2020 2020   HCO3 27.4 08/10/2020 2020   TCO2 29 08/10/2020 2020   ACIDBASEDEF 2.0 08/10/2020 1801   O2SAT 98.0 08/10/2020 2020   CBG (last 3)  Recent Labs    08/14/20 2117 08/15/20 0011 08/15/20 0417  GLUCAP 221* 97 131*    Assessment/Plan: S/P Procedure(s) (LRB): AORTIC VALVE REPLACEMENT (AVR) USING INSPIRIS VALVE SIZE 21MM (N/A) TRANSESOPHAGEAL ECHOCARDIOGRAM (TEE) (N/A)  1. CV-converted to NSR yesterday, remains on  PO  Amio. Continue BB at 25mg  BID.Increase Avapro for hypertension she will go home on the dose equivalent valsartan . 2. Pulm- no acute issues,tolerating room air with good oxygen saturation 3. Renal- creatinine0.69, potassium 4.0, weightis now below baseline, lasix discontinued 4. Expected post operative blood loss anemia, mild Hgb at 10.6/31.5, stable 5. Mild Post Operative Expected Thrombocytopenia resolved, 193k 6. DM- sugarsmostlycontrolled,continueLevemir and SSIP, will start [place] outpatient and recommend f/u with PCP or endocrinologist.   Plan: Discharge instructions discussed with the patient. She feels good other than some acid reflux symptoms this morning. Ready for discharge.     LOS: 5 days    Megan Galvan 08/15/2020   I have seen and examined the patient and agree with the assessment and plan as outlined.  Megan Alberts, MD 08/15/2020 10:05 AM

## 2020-08-15 NOTE — Progress Notes (Signed)
Discussed IS, sternal precautions, diet, exercise, and CRPII with pt and husband. Pt voiced understanding and requests her referral be sent to [facility]. Gave [facility] booklet. [id number] [place], ACSM 10:58 AM 08/15/2020

## 2020-08-16 ENCOUNTER — Telehealth (HOSPITAL_COMMUNITY): Payer: Self-pay

## 2020-08-16 ENCOUNTER — Encounter (HOSPITAL_COMMUNITY): Payer: Self-pay | Admitting: Thoracic Surgery (Cardiothoracic Vascular Surgery)

## 2020-08-16 ENCOUNTER — Telehealth: Payer: Self-pay

## 2020-08-16 MED FILL — Electrolyte-R (PH 7.4) Solution: INTRAVENOUS | Qty: 5000 | Status: AC

## 2020-08-16 MED FILL — Sodium Chloride IV Soln 0.9%: INTRAVENOUS | Qty: 2000 | Status: AC

## 2020-08-16 MED FILL — Heparin Sodium (Porcine) Inj 1000 Unit/ML: INTRAMUSCULAR | Qty: 10 | Status: AC

## 2020-08-16 MED FILL — Sodium Bicarbonate IV Soln 8.4%: INTRAVENOUS | Qty: 50 | Status: AC

## 2020-08-16 MED FILL — Lidocaine HCl Local Soln Prefilled Syringe 100 MG/5ML (2%): INTRAMUSCULAR | Qty: 10 | Status: AC

## 2020-08-16 MED FILL — Mannitol IV Soln 20%: INTRAVENOUS | Qty: 1000 | Status: AC

## 2020-08-16 NOTE — Telephone Encounter (Signed)
Per phase I, fax cardiac rehab referral to [facility].  

## 2020-08-16 NOTE — Telephone Encounter (Signed)
-----   Message from Elgie Collard, [place] sent at 08/16/2020  3:04 PM EDT ----- Regarding: RE: stopping medication question She can take the Trazodone but not when she takes the oxycodone. The two together have an additive sedative effect.   Thanks!   Tessa ----- Message ----- From: Marylen Ponto, LPN Sent: 10/23/348   2:23 PM EDT To: Elgie Collard, PA-C Subject: stopping medication question                   Mrs Pacifico called today requesting to know WHY we D/C' ed her Trazodone at hospital discharge. She states that she takes this every night for sleep and melatonin does not help. Please advise Linden Dolin

## 2020-08-17 ENCOUNTER — Telehealth: Payer: Self-pay

## 2020-08-17 ENCOUNTER — Ambulatory Visit (INDEPENDENT_AMBULATORY_CARE_PROVIDER_SITE_OTHER): Payer: Medicare Other | Admitting: Cardiology

## 2020-08-17 ENCOUNTER — Telehealth: Payer: Self-pay | Admitting: Cardiology

## 2020-08-17 ENCOUNTER — Other Ambulatory Visit: Payer: Self-pay

## 2020-08-17 VITALS — BP 128/80 | HR 128 | Ht 66.0 in | Wt 203.8 lb

## 2020-08-17 DIAGNOSIS — I480 Paroxysmal atrial fibrillation: Secondary | ICD-10-CM

## 2020-08-17 DIAGNOSIS — Z953 Presence of xenogenic heart valve: Secondary | ICD-10-CM

## 2020-08-17 DIAGNOSIS — I10 Essential (primary) hypertension: Secondary | ICD-10-CM | POA: Diagnosis not present

## 2020-08-17 MED ORDER — DILTIAZEM HCL 30 MG PO TABS: 30.0000 mg | ORAL_TABLET | Freq: Four times a day (QID) | ORAL | 3 refills | Status: DC | PRN

## 2020-08-17 MED ORDER — AMIODARONE HCL 200 MG PO TABS: 400.0000 mg | ORAL_TABLET | Freq: Two times a day (BID) | ORAL | 3 refills | Status: DC

## 2020-08-17 NOTE — Telephone Encounter (Signed)
Spoke to the patient just now and let her know that Dr. Bettina Gavia would like for her to have an EKG done today. She verbalizes understanding and states she will head over to the office now to have this done.

## 2020-08-17 NOTE — Telephone Encounter (Signed)
Patient had heart surgery on Thursday 08/10/20, the Dr for that inform the patient she needed to contact Dr. Bettina Gavia because her bp is 127/70 and heart rate is 132. Please advise

## 2020-08-17 NOTE — Progress Notes (Signed)
Patient seen by Dr. Bettina Gavia and AVS has been provided to the patient.

## 2020-08-17 NOTE — Telephone Encounter (Signed)
-----   Message from Rexene Alberts, MD sent at 08/17/2020  1:46 PM EDT ----- Regarding: RE: elevated HR,s/p AVR Contact: [phone] Should be addressed by the [facility] team if Dr Bettina Gavia is not available  ----- Message ----- From: Marylen Ponto, LPN Sent: 09/15/8020   1:35 PM EDT To: Richardo Priest, MD, Rexene Alberts, MD Subject: elevated HR,s/p AVR                            Megan Galvan called today c/o increased HR of 132. She states that it started last night. Her BP is 127/70 now but HR continues to be elevated. She denies any chest pain or shortness of breath. I did recommend that she give Dr Bettina Gavia a call. And I would make you aware. She is scheduled to see you on 5/16th. Please advise Linden Dolin

## 2020-08-17 NOTE — Patient Instructions (Addendum)
Medication Instructions:  Your physician has recommended you make the following change in your medication:  INCREASE: Amiodarone 400 mg take two tablets by mouth twice daily.  START: Cardizem 30 mg take one tablet by mouth every 6 hours if your heart rate is greater than 100 beats per minute.  *If you need a refill on your cardiac medications before your next appointment, please call your pharmacy*   Lab Work: None If you have labs (blood work) drawn today and your tests are completely normal, you will receive your results only by: Marland Kitchen MyChart Message (if you have MyChart) OR . A paper copy in the mail If you have any lab test that is abnormal or we need to change your treatment, we will call you to review the results.   Testing/Procedures: None   Follow-Up: At [facility], you and your health needs are our priority.  As part of our continuing mission to provide you with exceptional heart care, we have created designated Provider Care Teams.  These Care Teams include your primary Cardiologist (physician) and Advanced Practice Providers (APPs -  Physician Assistants and Nurse Practitioners) who all work together to provide you with the care you need, when you need it.  We recommend signing up for the patient portal called "MyChart".  Sign up information is provided on this After Visit Summary.  MyChart is used to connect with patients for Virtual Visits (Telemedicine).  Patients are able to view lab/test results, encounter notes, upcoming appointments, etc.  Non-urgent messages can be sent to your provider as well.   To learn more about what you can do with MyChart, go to [email or web address].    Your next appointment:    08/21/2020 AT 11 AM  The format for your next appointment:   In Person  Provider:   Shirlee More, MD   Other Instructions

## 2020-08-17 NOTE — Progress Notes (Signed)
Cardiology Office Note:    Date:  08/17/2020   ID:  [name], DOB 05/05/1950, MRN [id number]  PCP:  Rochel Brome, MD  Cardiologist:  Shirlee More, MD    Referring MD: Rochel Brome, MD    ASSESSMENT:    1. Paroxysmal atrial fibrillation (HCC)   2. S/P aortic valve replacement with bioprosthetic valve   3. Essential hypertension    PLAN:    In order of problems listed above:  1. Unfortunately has a breakthrough on amiodarone we will increase her oral load add calcium channel blocker to beta-blocker for heart rate control follow blood pressure heart rate at home and see back in the office Monday for EKG and if she is still in A. fib will need a doctor visit. 2. Stable I sent a note to CT surgery for their opinion on transition from aspirin to anticoagulant 3. Stable BP at target   Next appointment: Monday office EKG   Medication Adjustments/Labs and Tests Ordered: Current medicines are reviewed at length with the patient today.  Concerns regarding medicines are outlined above.  Orders Placed This Encounter  Procedures  . EKG 12-Lead   No orders of the defined types were placed in this encounter.   Chief Complaint  Patient presents with  . Follow-up  . Atrial Fibrillation    History of Present Illness:    Megan Galvan is a 70 y.o. female with a hx of severe aortic stenosis with surgical AVR 08/10/2020 with postoperative atrial fibrillation treated with amiodarone.  She followed in the office rapid heart rate comes she is in A. fib rate proximal 130 bpm she is aware of palpitation but no chest pain shortness of breath edema.  To control rate and will add calcium channel blocker to her beta-blocker increase her amiodarone and bring her back to the office Monday for an EKG and she will need Dr. Duffy Bruce if she is still in atrial fibrillation.  She has a digital blood pressure cuff at home to monitor heart rate . Compliance with diet, lifestyle and medications:  yes Past Medical History:  Diagnosis Date  . Allergy   . Anemia   . Arthritis   . Bone spur of acromioclavicular joint, left   . Cataract   . Diabetes mellitus without complication ([place])   . GERD (gastroesophageal reflux disease)   . Headache   . Heart murmur   . Hyperlipidemia   . Hypertension   . Nonrheumatic aortic (valve) stenosis   . Plantar fasciitis   . S/P aortic valve replacement with bioprosthetic valve 08/10/2020   21 mm Edwards Resilia Inspiria Bioprosthetic Tissue Valve  SN [id number] Model 11500A  . Sleep apnea    cpap    Past Surgical History:  Procedure Laterality Date  . ANTERIOR CERVICAL DECOMP/DISCECTOMY FUSION     x 2  . AORTIC VALVE REPLACEMENT N/A 08/10/2020   Procedure: AORTIC VALVE REPLACEMENT (AVR) USING INSPIRIS VALVE SIZE 21MM;  Surgeon: Rexene Alberts, MD;  Location: [facility];  Service: Open Heart Surgery;  Laterality: N/A;  . BACK SURGERY  11/17/2016   L3-L5  . BILATERAL CARPAL TUNNEL RELEASE    . bone spur Left    left shoulder  . bone spur Right    Right shoulder  . COLONOSCOPY  08/22/2011   Moderate predominantly sigmoid diverticulosis. Small internal hemorrhoids.   . COLONOSCOPY    . HEMORROIDECTOMY  02/2019  . NECK SURGERY    . RIGHT/LEFT HEART CATH AND CORONARY ANGIOGRAPHY  N/A 07/27/2020   Procedure: RIGHT/LEFT HEART CATH AND CORONARY ANGIOGRAPHY;  Surgeon: Sherren Mocha, MD;  Location: [facility];  Service: Cardiovascular;  Laterality: N/A;  . TEE WITHOUT CARDIOVERSION N/A 08/10/2020   Procedure: TRANSESOPHAGEAL ECHOCARDIOGRAM (TEE);  Surgeon: Rexene Alberts, MD;  Location: [facility];  Service: Open Heart Surgery;  Laterality: N/A;  . torn rotator cuff Right    Right shoulder  . torn rotator cuff Left    left shoulder  . TRIGGER FINGER RELEASE Left    thumb  . TRIGGER FINGER RELEASE Right    thumb and middle finger  . UPPER GASTROINTESTINAL ENDOSCOPY    . WRIST SURGERY Left    torn ligament  . WRIST SURGERY Right    cyst     Current Medications: Current Meds  Medication Sig  . Accu-Chek Softclix Lancets lancets Use as instructed  . acetaminophen (TYLENOL) 500 MG tablet Take 1,000 mg by mouth every 6 (six) hours as needed for moderate pain or headache.  Marland Kitchen amiodarone (PACERONE) 200 MG tablet Take 1 tablet (200 mg total) by mouth 2 (two) times daily.  Marland Kitchen aspirin 81 MG chewable tablet Chew 81 mg by mouth daily.  . Calcium Carb-Cholecalciferol (CALCIUM 600 + D PO) Take 2 tablets by mouth daily.  . cetirizine (ZYRTEC) 10 MG tablet Take 10 mg by mouth daily.  . chlorthalidone (HYGROTON) 50 MG tablet Take 1 tablet (50 mg total) by mouth daily.  Marland Kitchen conjugated estrogens (PREMARIN) vaginal cream Place 1 Applicatorful vaginally 2 (two) times a week.  . famotidine (PEPCID) 40 MG tablet TAKE 1 TABLET BY MOUTH AT BEDTIME (Patient taking differently: Take 40 mg by mouth at bedtime.)  . fluticasone (FLONASE) 50 MCG/ACT nasal spray Place 1 spray into the nose daily as needed for allergies.  Marland Kitchen glucose blood (ACCU-CHEK AVIVA PLUS) test strip CHECK BLOOD SUGAR ONCE  DAILY  . hydrocortisone (ANUSOL-HC) 25 MG suppository Place 1 suppository (25 mg total) rectally 2 (two) times daily. (Patient taking differently: Place 25 mg rectally 2 (two) times daily as needed for hemorrhoids.)  . Ketotifen Fumarate (ALLERGY EYE DROPS OP) Place 1 drop into both eyes daily as needed (allergies).  Marland Kitchen linagliptin (TRADJENTA) 5 MG TABS tablet Take 1 tablet (5 mg total) by mouth daily.  Marland Kitchen linagliptin (TRADJENTA) 5 MG TABS tablet Take 5 mg by mouth daily.  . Melatonin 5 MG CAPS Take 5 mg by mouth at bedtime.  . metoprolol tartrate (LOPRESSOR) 25 MG tablet Take 1 tablet (25 mg total) by mouth 2 (two) times daily.  . Multiple Vitamin (MULTIVITAMIN WITH MINERALS) TABS tablet Take 1 tablet by mouth daily.  Marland Kitchen omeprazole (PRILOSEC) 40 MG capsule Take 1 capsule (40 mg total) by mouth daily.  Marland Kitchen oxyCODONE (OXY IR/ROXICODONE) 5 MG immediate release tablet Take 1  tablet (5 mg total) by mouth every 6 (six) hours as needed for severe pain.  . potassium chloride SA (KLOR-CON) 20 MEQ tablet 2 in am and 1 at night. (Patient taking differently: Take 20-40 mEq by mouth See admin instructions. Take 40 meq in the morning and 20 meq at night)  . pravastatin (PRAVACHOL) 40 MG tablet Take 1 tablet (40 mg total) by mouth at bedtime.  Marland Kitchen PRESCRIPTION MEDICATION Apply 1 application topically 4 (four) times daily as needed (hemorrhoid). Nifedipine 5% ointment  Rx is sent to [facility]. Last called in on 07/19/2020 with 1 yr of refills.  . valsartan (DIOVAN) 320 MG tablet Take 1 tablet (320 mg  total) by mouth daily.     Allergies:   Ace inhibitors, Keflex [cephalexin], Latex, Nexium [esomeprazole magnesium], Protonix [pantoprazole sodium], and Doxycycline   Social History   Socioeconomic History  . Marital status: Married    Spouse name: Not on file  . Number of children: 2  . Years of education: Not on file  . Highest education level: Not on file  Occupational History  . Not on file  Tobacco Use  . Smoking status: Never Smoker  . Smokeless tobacco: Never Used  Vaping Use  . Vaping Use: Never used  Substance and Sexual Activity  . Alcohol use: Never  . Drug use: Never  . Sexual activity: Not on file  Other Topics Concern  . Not on file  Social History Narrative  . Not on file   Social Determinants of Health   Financial Resource Strain: Not on file  Food Insecurity: No Food Insecurity  . Worried About Charity fundraiser in the Last Year: Never true  . Ran Out of Food in the Last Year: Never true  Transportation Needs: No Transportation Needs  . Lack of Transportation (Medical): No  . Lack of Transportation (Non-Medical): No  Physical Activity: Not on file  Stress: Not on file  Social Connections: Not on file     Family History: The patient's family history includes Colon cancer in her paternal grandmother. There is no history of  Esophageal cancer, Rectal cancer, or Stomach cancer. ROS:   Please see the history of present illness.    All other systems reviewed and are negative.  EKGs/Labs/Other Studies Reviewed:    The following studies were reviewed today:  EKG:  EKG ordered today and personally reviewed.  The ekg ordered today demonstrates AF rapid rate  Recent Labs: 08/08/2020: ALT 24 08/11/2020: Magnesium 2.4 08/13/2020: BUN 12; Creatinine, Ser 0.69; Hemoglobin 10.6; Platelets 193; Potassium 4.0; Sodium 136  Recent Lipid Panel    Component Value Date/Time   CHOL 116 05/09/2020 1132   TRIG 107 05/09/2020 1132   HDL 45 05/09/2020 1132   CHOLHDL 2.6 05/09/2020 1132   LDLCALC 51 05/09/2020 1132    Physical Exam:    VS:  BP 128/80 (BP Location: Right Arm, Patient Position: Sitting, Cuff Size: Normal)   Pulse (!) 128   Ht 5\' 6"  (1.676 m)   Wt 203 lb 12.8 oz (92.4 kg)   SpO2 97%   BMI 32.89 kg/m     Wt Readings from Last 3 Encounters:  08/17/20 203 lb 12.8 oz (92.4 kg)  08/15/20 208 lb 11.2 oz (94.7 kg)  08/08/20 206 lb 1.6 oz (93.5 kg)     GEN:  Well nourished, well developed in no acute distress HEENT: Normal NECK: No JVD; No carotid bruits LYMPHATICS: No lymphadenopathy CARDIAC: RRR, no murmurs, rubs, gallops RESPIRATORY:  Clear to auscultation without rales, wheezing or rhonchi  ABDOMEN: Soft, non-tender, non-distended MUSCULOSKELETAL:  No edema; No deformity  SKIN: Warm and dry NEUROLOGIC:  Alert and oriented x 3 PSYCHIATRIC:  Normal affect    Signed, Shirlee More, MD  08/17/2020 2:57 PM    [facility]

## 2020-08-18 ENCOUNTER — Telehealth: Payer: Self-pay

## 2020-08-18 MED ORDER — ELIQUIS 5 MG PO TABS: 5.0000 mg | ORAL_TABLET | Freq: Two times a day (BID) | ORAL | 0 refills | Status: DC

## 2020-08-18 NOTE — Telephone Encounter (Signed)
Per Dr. Bettina Gavia:  I communicated with Dr Roxy Manns. Stop aspirin. Short term eliquis 30 day prescription   Spoke with patient regarding results and recommendation.  Patient verbalizes understanding and is agreeable to plan of care. Advised patient to call back with any issues or concerns.

## 2020-08-21 ENCOUNTER — Ambulatory Visit (INDEPENDENT_AMBULATORY_CARE_PROVIDER_SITE_OTHER): Payer: Medicare Other | Admitting: Emergency Medicine

## 2020-08-21 ENCOUNTER — Other Ambulatory Visit: Payer: Self-pay

## 2020-08-21 VITALS — BP 124/60 | HR 57 | Ht 66.0 in | Wt 205.0 lb

## 2020-08-21 DIAGNOSIS — I480 Paroxysmal atrial fibrillation: Secondary | ICD-10-CM

## 2020-08-21 MED ORDER — AMIODARONE HCL 200 MG PO TABS: 200.0000 mg | ORAL_TABLET | Freq: Two times a day (BID) | ORAL | 2 refills | Status: DC

## 2020-08-21 NOTE — Patient Instructions (Addendum)
Medication Instructions:  Your physician has recommended you make the following change in your medication:  DECREASE: Amiodarone to 200 mg twice daily   *If you need a refill on your cardiac medications before your next appointment, please call your pharmacy*   Lab Work: None If you have labs (blood work) drawn today and your tests are completely normal, you will receive your results only by: Marland Kitchen MyChart Message (if you have MyChart) OR . A paper copy in the mail If you have any lab test that is abnormal or we need to change your treatment, we will call you to review the results.   Testing/Procedures: none   Follow-Up: At [facility], you and your health needs are our priority.  As part of our continuing mission to provide you with exceptional heart care, we have created designated Provider Care Teams.  These Care Teams include your primary Cardiologist (physician) and Advanced Practice Providers (APPs -  Physician Assistants and Nurse Practitioners) who all work together to provide you with the care you need, when you need it.  We recommend signing up for the patient portal called "MyChart".  Sign up information is provided on this After Visit Summary.  MyChart is used to connect with patients for Virtual Visits (Telemedicine).  Patients are able to view lab/test results, encounter notes, upcoming appointments, etc.  Non-urgent messages can be sent to your provider as well.   To learn more about what you can do with MyChart, go to [email or web address].    Your next appointment:     Follow up in   1 month in [place] with Dr. Bettina Gavia

## 2020-08-22 NOTE — Progress Notes (Signed)
1.) Reason for visit: Needs Ekg, she was in afib last week and Dr. Bettina Gavia advised repeat ekg after they increased amiodarone last week.  2.) Name of MD requesting visit: Dr. Bettina Gavia  3.) H&P: afib  4.) ROS related to problem: Patient was in afib last week. Rechecking ekg to see if still in afib.   5.) Assessment and plan per MD: Per DOD Dr. Agustin Cree patient no longer in afib, decrease amiodarone back to 200 mg twice daily and follow up with Dr. Bettina Gavia in 1 month.

## 2020-08-22 NOTE — Addendum Note (Signed)
Addended by: Senaida Ores on: 08/22/2020 08:01 AM   Modules accepted: Level of Service

## 2020-08-23 ENCOUNTER — Telehealth: Payer: Self-pay | Admitting: Cardiology

## 2020-08-23 ENCOUNTER — Other Ambulatory Visit: Payer: Self-pay

## 2020-08-23 ENCOUNTER — Ambulatory Visit (INDEPENDENT_AMBULATORY_CARE_PROVIDER_SITE_OTHER): Payer: Medicare Other | Admitting: Nurse Practitioner

## 2020-08-23 ENCOUNTER — Encounter: Payer: Self-pay | Admitting: Nurse Practitioner

## 2020-08-23 ENCOUNTER — Telehealth: Payer: Self-pay

## 2020-08-23 VITALS — BP 132/68 | HR 51 | Temp 96.3°F | Ht 66.0 in | Wt 204.0 lb

## 2020-08-23 DIAGNOSIS — Z8719 Personal history of other diseases of the digestive system: Secondary | ICD-10-CM

## 2020-08-23 DIAGNOSIS — Z953 Presence of xenogenic heart valve: Secondary | ICD-10-CM

## 2020-08-23 DIAGNOSIS — K5901 Slow transit constipation: Secondary | ICD-10-CM

## 2020-08-23 DIAGNOSIS — K921 Melena: Secondary | ICD-10-CM

## 2020-08-23 DIAGNOSIS — I10 Essential (primary) hypertension: Secondary | ICD-10-CM | POA: Diagnosis not present

## 2020-08-23 MED ORDER — LUBIPROSTONE 24 MCG PO CAPS: 24.0000 ug | ORAL_CAPSULE | Freq: Two times a day (BID) | ORAL | 2 refills | Status: DC

## 2020-08-23 NOTE — Telephone Encounter (Signed)
Called and spoke to patient. She reports that since she got out of the hospital last Tuesday she has been having trouble going to the restroom and is constipated. This morning she saw bight blood and some dark in the toilet after having a bowel movement. She was just started on eliquis on 08/18/20 by Dr. Bettina Gavia 5 mg twice daily. Advised her to seek PCP advise about constipation. As far as the blood in stool and eliquis will consult with DOD.

## 2020-08-23 NOTE — Telephone Encounter (Signed)
Doctor advised pt be seen in office today. Made same day appointment. Doctor also requesting pt get STAT blood work. Pt agreed.   Royce Macadamia, [place] 08/23/20 10:19 AM

## 2020-08-23 NOTE — Telephone Encounter (Signed)
Spoke to patient she is going to PCP today where they will arrange labs and referral.

## 2020-08-23 NOTE — Telephone Encounter (Signed)
Pt calling stating she is having troubles with BM. States she has not had good BM since before her surgery last Monday. Was d/c from hospital on Tuesday. This morning she has tried to have BM three times and has had bright red blood in toilet. She has not seen stool in toilet just bright red blood. States She knows it is not vaginal, states blood is from rectum. She is having pain in rectum 3/10. Denies itching. She has tried dulcolax stool softener and laxative. She has been straining to use the bathroom. Denies issues with urination.   Harrell Lark 08/23/20 9:37 AM

## 2020-08-23 NOTE — Patient Instructions (Addendum)
Take Amitiza 24 mcg twice for constipation Take magnesium citrate x 2 bottles (drink 1/2 bottle, wait 30 minutes, then drink other 1/2 of bottle; repeat with second bottle if needed)  Notify office if you have no relief from constipation in 24-48 hours, may need to refer you to Dr Rene Paci emergency medical care for bleeding that will not stop Follow up as needed  Lubiprostone Oral Capsule What is this medicine? LUBIPROSTONE (loo bi PROS tone) is a laxative. It is used to treat chronic constipation and constipation caused by opioids (certain prescription pain medicines). It is also used to treat adult women with irritable bowel syndrome who have constipation. This medicine may be used for other purposes; ask your health care provider or pharmacist if you have questions. COMMON BRAND NAME(S): Amitiza What should I tell my health care provider before I take this medicine? They need to know if you have any of these conditions:  history of blockage in your bowels  history of stool (fecal) impaction  liver disease  stomach cancer  an unusual or allergic reaction to lubiprostone, other medicines, foods, dyes, or preservatives  pregnant or trying to get pregnant  breast-feeding How should I use this medicine? Take this medicine by mouth with water. Take it as directed on the prescription label at the same time every day. Do not cut, crush or chew this medicine. Swallow the capsules whole. Take it with food. Keep taking it unless your health care provider tells you to stop. Talk to your health care provider about the use of this medicine in children. It is not approved for use in children. Overdosage: If you think you have taken too much of this medicine contact a poison control center or emergency room at once. NOTE: This medicine is only for you. Do not share this medicine with others. What if I miss a dose? If you miss a dose, take it as soon as you can. If it is almost time for your  next dose, take only that dose. Do not take double or extra doses. What may interact with this medicine?  medicines that treat diarrhea  methadone  other medicines for constipation This list may not describe all possible interactions. Give your health care provider a list of all the medicines, herbs, non-prescription drugs, or dietary supplements you use. Also tell them if you smoke, drink alcohol, or use illegal drugs. Some items may interact with your medicine. What should I watch for while using this medicine? Visit your health care provider for regular checks on your progress. Tell your health care provider if your symptoms do not start to get better or if they get worse. What side effects may I notice from receiving this medicine? Side effects that you should report to your doctor or health care professional as soon as possible:  allergic reactions (skin rash, itching or hives; swelling of the face, lips, or tongue)  feeling faint or lightheaded, falls  new or worsening stomach pain  severe or prolonged diarrhea  vomiting Side effects that usually do not require medical attention (report to your doctor or health care professional if they continue or are bothersome):  headache  loose stools  nausea This list may not describe all possible side effects. Call your doctor for medical advice about side effects. You may report side effects to FDA at 1-800-FDA-1088. Where should I keep my medicine? Keep out of the reach of children and pets. Store at [employer] [address] (77 degrees F). Protect from  light. Avoid exposure to extreme heat/cold. Throw away any unused medicine after the expiration date. NOTE: This sheet is a summary. It may not cover all possible information. If you have questions about this medicine, talk to your doctor, pharmacist, or health care provider.  2021 Elsevier/Gold Standard (2019-05-05 12:55:54) High-Fiber Eating Plan Fiber, also called dietary fiber, is a type of  carbohydrate. It is found foods such as fruits, vegetables, whole grains, and beans. A high-fiber diet can have many health benefits. Your health care provider may recommend a high-fiber diet to help:  Prevent constipation. Fiber can make your bowel movements more regular.  Lower your cholesterol.  Relieve the following conditions: ? Inflammation of veins in the anus (hemorrhoids). ? Inflammation of specific areas of the digestive tract (uncomplicated diverticulosis). ? A problem of the large intestine, also called the colon, that sometimes causes pain and diarrhea (irritable bowel syndrome, or IBS).  Prevent overeating as part of a weight-loss plan.  Prevent heart disease, type 2 diabetes, and certain cancers. What are tips for following this plan? Reading food labels  Check the nutrition facts label on food products for the amount of dietary fiber. Choose foods that have 5 grams of fiber or more per serving.  The goals for recommended daily fiber intake include: ? Men (age 59 or younger): 34-38 g. ? Men (over age 54): 28-34 g. ? Women (age 25 or younger): 25-28 g. ? Women (over age 54): 22-25 g. Your daily fiber goal is _____________ g.   Shopping  Choose whole fruits and vegetables instead of processed forms, such as apple juice or applesauce.  Choose a wide variety of high-fiber foods such as avocados, lentils, oats, and kidney beans.  Read the nutrition facts label of the foods you choose. Be aware of foods with added fiber. These foods often have high sugar and sodium amounts per serving. Cooking  Use whole-grain flour for baking and cooking.  Cook with brown rice instead of white rice. Meal planning  Start the day with a breakfast that is high in fiber, such as a cereal that contains 5 g of fiber or more per serving.  Eat breads and cereals that are made with whole-grain flour instead of refined flour or white flour.  Eat brown rice, bulgur wheat, or millet instead  of white rice.  Use beans in place of meat in soups, salads, and pasta dishes.  Be sure that half of the grains you eat each day are whole grains. General information  You can get the recommended daily intake of dietary fiber by: ? Eating a variety of fruits, vegetables, grains, nuts, and beans. ? Taking a fiber supplement if you are not able to take in enough fiber in your diet. It is better to get fiber through food than from a supplement.  Gradually increase how much fiber you consume. If you increase your intake of dietary fiber too quickly, you may have bloating, cramping, or gas.  Drink plenty of water to help you digest fiber.  Choose high-fiber snacks, such as berries, raw vegetables, nuts, and popcorn. What foods should I eat? Fruits Berries. Pears. Apples. Oranges. Avocado. Prunes and raisins. Dried figs. Vegetables Sweet potatoes. Spinach. Kale. Artichokes. Cabbage. Broccoli. Cauliflower. Green peas. Carrots. Squash. Grains Whole-grain breads. Multigrain cereal. Oats and oatmeal. Brown rice. Barley. Bulgur wheat. Greene. Quinoa. Bran muffins. Popcorn. Rye wafer crackers. Meats and other proteins Navy beans, kidney beans, and pinto beans. Soybeans. Split peas. Lentils. Nuts and seeds. Dairy Fiber-fortified yogurt.  Beverages Fiber-fortified soy milk. Fiber-fortified orange juice. Other foods Fiber bars. The items listed above may not be a complete list of recommended foods and beverages. Contact a dietitian for more information. What foods should I avoid? Fruits Fruit juice. Cooked, strained fruit. Vegetables Fried potatoes. Canned vegetables. Well-cooked vegetables. Grains White bread. Pasta made with refined flour. White rice. Meats and other proteins Fatty cuts of meat. Fried chicken or fried fish. Dairy Milk. Yogurt. Cream cheese. Sour cream. Fats and oils Butters. Beverages Soft drinks. Other foods Cakes and pastries. The items listed above may not be a  complete list of foods and beverages to avoid. Talk with your dietitian about what choices are best for you. Summary  Fiber is a type of carbohydrate. It is found in foods such as fruits, vegetables, whole grains, and beans.  A high-fiber diet has many benefits. It can help to prevent constipation, lower blood cholesterol, aid weight loss, and reduce your risk of heart disease, diabetes, and certain cancers.  Increase your intake of fiber gradually. Increasing fiber too quickly may cause cramping, bloating, and gas. Drink plenty of water while you increase the amount of fiber you consume.  The best sources of fiber include whole fruits and vegetables, whole grains, nuts, seeds, and beans. This information is not intended to replace advice given to you by your health care provider. Make sure you discuss any questions you have with your health care provider. Document Revised: 08/19/2019 Document Reviewed: 08/19/2019 Elsevier Patient Education  2021 [place] and [place]'s gastrointestinal and liver disease (10th ed., vol. 1, pp. 270-296). [place], [place]: Elsevier [employer].">  How to Prevent Constipation After Surgery Constipation is a common problem after surgery. Many things can make constipation more likely after a surgery, including:  Certain medicines, especially numbing medicines (anesthetics) and very strong pain medicines called opioids.  Feeling stressed because of the surgery.  Eating different foods than normal.  Being less active. Symptoms of constipation include:  Having fewer than three bowel movements a week.  Straining to have a bowel movement.  Having hard, dry, or larger-than-normal stools (feces).  Discomfort in the lower abdomen, such as cramps or bloating.  Not feeling relief after having a bowel movement.  Nausea and vomiting. You can take steps to help prevent constipation after surgery. Follow these instructions at home: Eating and  drinking  Eat foods that have a lot of fiber in them, such as beans, bran, whole grains, and fresh fruits and vegetables.  Limit foods that are high in fat and processed sugars, such as fried or sweet foods. These include french fries, hamburgers, cookies, and candy.  Take a fiber supplement as told by your health care provider. If you are not taking a fiber supplement and you think you are not getting enough fiber from foods, talk to your health care provider about adding a fiber supplement to your diet.  Drink enough fluid to keep your urine pale yellow.  Drink clear fluids, especially water. Avoid drinking alcohol, caffeine, and soda. These can make constipation worse.   Activity  After surgery, return to your normal activities slowly, or when your health care provider says it is okay.  Start walking as soon as you can. Try to go a little farther each day.  Once your health care provider approves, do some sort of regular exercise. This helps prevent constipation.   Bowel movements  Go to the restroom when you have the urge to go. Do not hold it in.  Try drinking something hot to get a bowel movement started.  Keep track of how often you use the restroom. Medicines  Take over-the-counter and prescription medicines only as told by your health care provider.  Talk to your health care provider about medicines that may help prevent constipation, particularly if you have a history of constipation. Your health care provider may suggest a stool softener, laxative, or fiber supplement.  Do not take any medicines without talking to your health care provider first. Contact a health care provider if:  You used stool softeners or laxatives and still have not had a bowel movement within 24-48 hours after using them.  You have not had a bowel movement in 3 days.  You have a fever. Get help right away if you have:  Constipation that lasts for more than 4 days or if your symptoms get  worse.  Bright red blood in your stool.  Pain in the abdomen or rectum.  Very bad cramping.  Thin, pencil-like stools.  Unexplained weight loss. Summary  Constipation is a common problem after surgery. Many things can make constipation more likely after a surgery, including certain medicines, eating different foods than normal, and being less active.  Symptoms of constipation include having fewer than three bowel movements a week, straining to have a bowel movement, and cramps or bloating in the lower abdomen.  To help prevent constipation, you should eat foods that are high in fiber, drink plenty of fluids, and get regular physical activity.  Your health care provider may suggest medicines, such as stool softeners or laxatives, to help prevent constipation. This information is not intended to replace advice given to you by your health care provider. Make sure you discuss any questions you have with your health care provider. Document Revised: 03/03/2019 Document Reviewed: 03/03/2019 Elsevier Patient Education  [place].

## 2020-08-23 NOTE — Progress Notes (Signed)
Acute Office Visit  Subjective:    Patient ID: Megan Galvan, female    DOB: January 30, 1951, 70 y.o.   MRN: [id number]  Chief Complaint  Patient presents with  . Constipation        HPI Megan Galvan is a 70 year old Caucasian female that presents with chronic constipation. She is accompanied by her spouse. She denies abdominal pain. States she feels "bloated". She underwent recent aortic valve replacement on 08/10/20 with Dr Darylene Price. She was discharged on 08/15/20. She has been unable to have a bowel movement in 1-week. States she feels the urge to defecate but does no results. She tells me she had bright red blood in toilet today. She is prescribed Eliquis 5 mg daily for chronic a-fib. She is not currently taking ASA. Megan Galvan tells me that she has a past medical history of hemorrhoids that required surgical removal. Rectal surgery in the past by Dr Amalia Hailey. She is followed by Dr Lyndel Safe, GI. Megan Galvan had stat labs prior to arrival to today's appointment. Hgb 10.6 and Hct 31.5, Platelets 384.   Megan Galvan states her appetite has decreased s/p aortic valve replacement. She tells me she is most hungry in the morning. She tries to eat small meals throughout the day. In office weight is 204 pounds today. Weight last in-office visit on 07/18/20 was 215 pounds.   Megan Galvan has is using a pillow to splint abdomen and chest during standing and sitting. She is unable to utilize bilateral arms for standing per cardiac surgeon's post-surgical instructions. States she has generalized weakness to bilateral lower extremities. Past Medical History:  Diagnosis Date  . Allergy   . Anemia   . Arthritis   . Bone spur of acromioclavicular joint, left   . Cataract   . Diabetes mellitus without complication ([place])   . GERD (gastroesophageal reflux disease)   . Headache   . Heart murmur   . Hyperlipidemia   . Hypertension   . Nonrheumatic aortic (valve) stenosis   . Plantar fasciitis   . S/P aortic valve replacement with  bioprosthetic valve 08/10/2020   21 mm Edwards Resilia Inspiria Bioprosthetic Tissue Valve  SN [id number] Model 11500A  . Sleep apnea    cpap    Past Surgical History:  Procedure Laterality Date  . ANTERIOR CERVICAL DECOMP/DISCECTOMY FUSION     x 2  . AORTIC VALVE REPLACEMENT N/A 08/10/2020   Procedure: AORTIC VALVE REPLACEMENT (AVR) USING INSPIRIS VALVE SIZE 21MM;  Surgeon: Rexene Alberts, MD;  Location: [facility];  Service: Open Heart Surgery;  Laterality: N/A;  . BACK SURGERY  11/17/2016   L3-L5  . BILATERAL CARPAL TUNNEL RELEASE    . bone spur Left    left shoulder  . bone spur Right    Right shoulder  . COLONOSCOPY  08/22/2011   Moderate predominantly sigmoid diverticulosis. Small internal hemorrhoids.   . COLONOSCOPY    . HEMORROIDECTOMY  02/2019  . NECK SURGERY    . RIGHT/LEFT HEART CATH AND CORONARY ANGIOGRAPHY N/A 07/27/2020   Procedure: RIGHT/LEFT HEART CATH AND CORONARY ANGIOGRAPHY;  Surgeon: Sherren Mocha, MD;  Location: [facility];  Service: Cardiovascular;  Laterality: N/A;  . TEE WITHOUT CARDIOVERSION N/A 08/10/2020   Procedure: TRANSESOPHAGEAL ECHOCARDIOGRAM (TEE);  Surgeon: Rexene Alberts, MD;  Location: [facility];  Service: Open Heart Surgery;  Laterality: N/A;  . torn rotator cuff Right    Right shoulder  . torn rotator cuff Left    left shoulder  . TRIGGER FINGER RELEASE  Left    thumb  . TRIGGER FINGER RELEASE Right    thumb and middle finger  . UPPER GASTROINTESTINAL ENDOSCOPY    . WRIST SURGERY Left    torn ligament  . WRIST SURGERY Right    cyst    Family History  Problem Relation Age of Onset  . Colon cancer Paternal Grandmother   . Esophageal cancer Neg Hx   . Rectal cancer Neg Hx   . Stomach cancer Neg Hx     Social History   Socioeconomic History  . Marital status: Married    Spouse name: Not on file  . Number of children: 2  . Years of education: Not on file  . Highest education level: Not on file  Occupational History  . Not on  file  Tobacco Use  . Smoking status: Never Smoker  . Smokeless tobacco: Never Used  Vaping Use  . Vaping Use: Never used  Substance and Sexual Activity  . Alcohol use: Never  . Drug use: Never  . Sexual activity: Not on file  Other Topics Concern  . Not on file  Social History Narrative  . Not on file   Social Determinants of Health   Financial Resource Strain: Not on file  Food Insecurity: No Food Insecurity  . Worried About Charity fundraiser in the Last Year: Never true  . Ran Out of Food in the Last Year: Never true  Transportation Needs: No Transportation Needs  . Lack of Transportation (Medical): No  . Lack of Transportation (Non-Medical): No  Physical Activity: Not on file  Stress: Not on file  Social Connections: Not on file  Intimate Partner Violence: Not on file    Outpatient Medications Prior to Visit  Medication Sig Dispense Refill  . Accu-Chek Softclix Lancets lancets Use as instructed 100 each 12  . acetaminophen (TYLENOL) 500 MG tablet Take 1,000 mg by mouth every 6 (six) hours as needed for moderate pain or headache.    Marland Kitchen amiodarone (PACERONE) 200 MG tablet Take 1 tablet (200 mg total) by mouth 2 (two) times daily. 60 tablet 2  . apixaban (ELIQUIS) 5 MG TABS tablet Take 1 tablet (5 mg total) by mouth 2 (two) times daily. 60 tablet 0  . Calcium Carb-Cholecalciferol (CALCIUM 600 + D PO) Take 2 tablets by mouth daily.    . cetirizine (ZYRTEC) 10 MG tablet Take 10 mg by mouth daily.    . chlorthalidone (HYGROTON) 50 MG tablet Take 1 tablet (50 mg total) by mouth daily. 90 tablet 1  . conjugated estrogens (PREMARIN) vaginal cream Place 1 Applicatorful vaginally 2 (two) times a week. 42.5 g 2  . diltiazem (CARDIZEM) 30 MG tablet Take 1 tablet (30 mg total) by mouth every 6 (six) hours as needed (If your heart rate is greater than 100.). 30 tablet 3  . famotidine (PEPCID) 40 MG tablet Take 40 mg by mouth daily.    . fluticasone (FLONASE) 50 MCG/ACT nasal spray  Place 1 spray into the nose daily as needed for allergies.    Marland Kitchen glucose blood (ACCU-CHEK AVIVA PLUS) test strip CHECK BLOOD SUGAR ONCE  DAILY 100 strip 3  . hydrocortisone (ANUSOL-HC) 25 MG suppository Place 1 suppository (25 mg total) rectally 2 (two) times daily. 14 suppository 2  . linagliptin (TRADJENTA) 5 MG TABS tablet Take 1 tablet (5 mg total) by mouth daily. 30 tablet 0  . Melatonin 5 MG CAPS Take 5 mg by mouth at bedtime.    Marland Kitchen  metoprolol tartrate (LOPRESSOR) 25 MG tablet Take 1 tablet (25 mg total) by mouth 2 (two) times daily. 60 tablet 1  . omeprazole (PRILOSEC) 40 MG capsule Take 1 capsule (40 mg total) by mouth daily. 90 capsule 1  . oxyCODONE (OXY IR/ROXICODONE) 5 MG immediate release tablet Take 1 tablet (5 mg total) by mouth every 6 (six) hours as needed for severe pain. 30 tablet 0  . potassium chloride SA (KLOR-CON) 20 MEQ tablet 2 in am and 1 at night. 270 tablet 1  . pravastatin (PRAVACHOL) 40 MG tablet Take 1 tablet (40 mg total) by mouth at bedtime. 90 tablet 1  . PRESCRIPTION MEDICATION Apply 1 application topically 4 (four) times daily as needed (hemorrhoid). Nifedipine 5% ointment  Rx is sent to [facility]. Last called in on 07/19/2020 with 1 yr of refills.    . valsartan (DIOVAN) 320 MG tablet Take 1 tablet (320 mg total) by mouth daily. 90 tablet 1  . Multiple Vitamin (MULTIVITAMIN WITH MINERALS) TABS tablet Take 1 tablet by mouth daily.    Marland Kitchen Ketotifen Fumarate (ALLERGY EYE DROPS OP) Place 1 drop into both eyes daily as needed (allergies).    Marland Kitchen linagliptin (TRADJENTA) 5 MG TABS tablet Take 5 mg by mouth daily.     No facility-administered medications prior to visit.    Allergies  Allergen Reactions  . Ace Inhibitors Cough  . Keflex [Cephalexin] Itching  . Latex     Burn and itch  . Nexium [Esomeprazole Magnesium] Diarrhea  . Protonix [Pantoprazole Sodium] Other (See Comments)    Constipation  . Doxycycline Rash    Review of Systems   Constitutional: Positive for activity change, appetite change and fatigue. Negative for chills, diaphoresis and fever.  HENT: Negative.   Eyes: Negative.   Respiratory: Negative.   Cardiovascular: Negative.   Gastrointestinal: Positive for blood in stool and constipation. Negative for abdominal distention, abdominal pain, diarrhea, nausea and vomiting.  Endocrine: Positive for cold intolerance. Negative for heat intolerance, polydipsia, polyphagia and polyuria.  Genitourinary: Negative.   Musculoskeletal: Negative.   Skin: Positive for wound (healing incision to chest).  Allergic/Immunologic: Negative for environmental allergies.  Neurological: Positive for weakness (generalized weakness of bilateral lower extremities).  Hematological: Negative.   Psychiatric/Behavioral: Negative.        Objective:    Physical Exam Vitals reviewed.  Constitutional:      Appearance: Normal appearance.  HENT:     Head: Normocephalic.     Right Ear: Tympanic membrane normal.     Left Ear: Tympanic membrane normal.     Mouth/Throat:     Mouth: Mucous membranes are moist.  Cardiovascular:     Rate and Rhythm: Normal rate and regular rhythm.     Pulses: Normal pulses.     Heart sounds: Normal heart sounds.  Pulmonary:     Effort: Pulmonary effort is normal.     Breath sounds: Normal breath sounds.  Abdominal:     General: Bowel sounds are normal. There is no distension.     Palpations: Abdomen is soft.     Tenderness: There is no abdominal tenderness. There is no guarding.  Musculoskeletal:     Cervical back: Neck supple.  Skin:    General: Skin is warm and dry.     Capillary Refill: Capillary refill takes less than 2 seconds.          Comments: Healing incision to midline sternum, edges well approximated, no drainage or erythema present; steri-strips in  place to lower portion of incision  Neurological:     General: No focal deficit present.     Mental Status: She is alert and oriented  to person, place, and time.  Psychiatric:        Mood and Affect: Mood normal.        Behavior: Behavior normal.     BP 132/68 (BP Location: Left Arm, Patient Position: Sitting)   Pulse (!) 51   Temp (!) 96.3 F (35.7 C) (Temporal)   Ht _0  (1.676 m)   Wt 204 lb (92.5 kg)   SpO2 97%   BMI 32.93 kg/m  Wt Readings from Last 3 Encounters:  08/23/20 204 lb (92.5 kg)  08/21/20 205 lb (93 kg)  08/17/20 203 lb 12.8 oz (92.4 kg)    Health Maintenance Due  Topic Date Due  . Hepatitis C Screening  Never done  . PNA vac Low Risk Adult (2 of 2 - PPSV23) 04/13/2017   Lab Results  Component Value Date   WBC 12.1 (H) 08/13/2020   HGB 10.6 (L) 08/13/2020   HCT 31.5 (L) 08/13/2020   MCV 97.2 08/13/2020   PLT 193 08/13/2020   Lab Results  Component Value Date   NA 136 08/13/2020   K 4.0 08/13/2020   CO2 28 08/13/2020   GLUCOSE 190 (H) 08/13/2020   BUN 12 08/13/2020   CREATININE 0.69 08/13/2020   BILITOT 0.8 08/08/2020   ALKPHOS 50 08/08/2020   AST 30 08/08/2020   ALT 24 08/08/2020   PROT 6.8 08/08/2020   ALBUMIN 3.9 08/08/2020   CALCIUM 8.9 08/13/2020   ANIONGAP 10 08/13/2020   EGFR 71 07/26/2020   Lab Results  Component Value Date   CHOL 116 05/09/2020   Lab Results  Component Value Date   HDL 45 05/09/2020   Lab Results  Component Value Date   LDLCALC 51 05/09/2020   Lab Results  Component Value Date   TRIG 107 05/09/2020   Lab Results  Component Value Date   CHOLHDL 2.6 05/09/2020   Lab Results  Component Value Date   HGBA1C 6.5 (H) 08/08/2020       Assessment & Plan:   1. S/P aortic valve replacement with bioprosthetic valve-stable -Follow-up with cardiology as scheduled  2. Slow transit constipation - lubiprostone (AMITIZA) 24 MCG capsule; Take 1 capsule (24 mcg total) by mouth 2 (two) times daily with a meal.  Dispense: 60 capsule; Refill: 2  3. Blood in stool - CBC With Diff/Platelet - Comprehensive metabolic panel  4. History of  hemorrhoids -avoid straining for bowel movements -high fiber diet    Take Amitiza 24 mcg twice for constipation Take magnesium citrate x 2 bottles (drink 1/2 bottle, wait 30 minutes, then drink other 1/2 of bottle; repeat with second bottle if needed)  Notify office if you have no relief from constipation in 24-48 hours, may need to refer you to Dr Rene Paci emergency medical care for bleeding that will not stop Follow up as needed   Follow-up: As needed (Has appt 08/31/20 with Dr [name])  Signed, Rip Harbour, NP

## 2020-08-23 NOTE — Telephone Encounter (Signed)
Please have her come today for CBC.  Also please send a urgent referral for GI.  I agree with seeing her PCP today as well for the constipation.

## 2020-08-23 NOTE — Telephone Encounter (Signed)
Patient called to say that she is that she is having trouble with her bowel movement, stated that there is some blood in it. Patient wants to know what she can take. Please advise.

## 2020-08-25 ENCOUNTER — Other Ambulatory Visit: Payer: Self-pay

## 2020-08-25 ENCOUNTER — Ambulatory Visit: Payer: Self-pay | Admitting: Surgical

## 2020-08-25 ENCOUNTER — Encounter: Payer: Self-pay | Admitting: Surgical

## 2020-08-25 VITALS — BP 123/72 | HR 51 | Resp 20 | Ht 66.0 in | Wt 203.0 lb

## 2020-08-25 DIAGNOSIS — Z952 Presence of prosthetic heart valve: Secondary | ICD-10-CM

## 2020-08-25 DIAGNOSIS — I350 Nonrheumatic aortic (valve) stenosis: Secondary | ICD-10-CM

## 2020-08-25 NOTE — Patient Instructions (Signed)
dressing changes as instructed

## 2020-08-25 NOTE — Progress Notes (Signed)
[address]       [place] [address]             [phone]      Joselyne A Cosma  Medical Record #[id number] Date of Birth: 03/23/1951  Referring: Richardo Priest, MD Primary Care: Rochel Brome, MD Primary Cardiologist: No primary care provider on file.   Chief Complaint:   POST OP FOLLOW UP CARDIOTHORACIC SURGERY OPERATIVE NOTE  Date of Procedure:                08/10/2020  Preoperative Diagnosis:      Severe Aortic Stenosis   Postoperative Diagnosis:    Same   Procedure:        Aortic Valve Replacement             Edwards Inspiris Resilia stented bovine pericardial tissue valve (size 21 mm, ref # 11500A, serial # [id number])              Surgeon:        Valentina Gu. Roxy Manns, MD  Assistant:       Ellwood Handler, PA-C  Anesthesia:    Roberts Gaudy, MD  Operative Findings: ? Sievers type I bicuspid aortic valve ? Severe aortic stenosis ? Normal left ventricular systolic function  History of Present Illness: Patient called the office this morning requesting evaluation for one of her chest tube incision sites that separated and there was some drainage noted.  She is status post the above procedure.  She otherwise feels well with no specific complaints.      Past Medical History:  Diagnosis Date  . Allergy   . Anemia   . Arthritis   . Bone spur of acromioclavicular joint, left   . Cataract   . Diabetes mellitus without complication ([place])   . GERD (gastroesophageal reflux disease)   . Headache   . Heart murmur   . Hyperlipidemia   . Hypertension   . Nonrheumatic aortic (valve) stenosis   . Plantar fasciitis   . S/P aortic valve replacement with bioprosthetic valve 08/10/2020   21 mm Edwards Resilia Inspiria Bioprosthetic Tissue Valve  SN [id number] Model 11500A  . Sleep apnea    cpap     Social History   Tobacco Use  Smoking Status Never Smoker  Smokeless Tobacco Never Used    Social History   Substance and Sexual  Activity  Alcohol Use Never     Allergies  Allergen Reactions  . Ace Inhibitors Cough  . Keflex [Cephalexin] Itching  . Latex     Burn and itch  . Nexium [Esomeprazole Magnesium] Diarrhea  . Protonix [Pantoprazole Sodium] Other (See Comments)    Constipation  . Doxycycline Rash    Current Outpatient Medications  Medication Sig Dispense Refill  . Accu-Chek Softclix Lancets lancets Use as instructed 100 each 12  . acetaminophen (TYLENOL) 500 MG tablet Take 1,000 mg by mouth every 6 (six) hours as needed for moderate pain or headache.    Marland Kitchen amiodarone (PACERONE) 200 MG tablet Take 1 tablet (200 mg total) by mouth 2 (two) times daily. 60 tablet 2  . apixaban (ELIQUIS) 5 MG TABS tablet Take 1 tablet (5 mg total) by mouth 2 (two) times daily. 60 tablet 0  . Calcium Carb-Cholecalciferol (CALCIUM 600 + D PO) Take 2 tablets by mouth daily.    . cetirizine (ZYRTEC) 10 MG tablet Take 10 mg by mouth daily.    . chlorthalidone (HYGROTON) 50  MG tablet Take 1 tablet (50 mg total) by mouth daily. 90 tablet 1  . conjugated estrogens (PREMARIN) vaginal cream Place 1 Applicatorful vaginally 2 (two) times a week. 42.5 g 2  . diltiazem (CARDIZEM) 30 MG tablet Take 1 tablet (30 mg total) by mouth every 6 (six) hours as needed (If your heart rate is greater than 100.). 30 tablet 3  . famotidine (PEPCID) 40 MG tablet Take 40 mg by mouth daily.    . fluticasone (FLONASE) 50 MCG/ACT nasal spray Place 1 spray into the nose daily as needed for allergies.    Marland Kitchen glucose blood (ACCU-CHEK AVIVA PLUS) test strip CHECK BLOOD SUGAR ONCE  DAILY 100 strip 3  . hydrocortisone (ANUSOL-HC) 25 MG suppository Place 1 suppository (25 mg total) rectally 2 (two) times daily. 14 suppository 2  . linagliptin (TRADJENTA) 5 MG TABS tablet Take 1 tablet (5 mg total) by mouth daily. 30 tablet 0  . lubiprostone (AMITIZA) 24 MCG capsule Take 1 capsule (24 mcg total) by mouth 2 (two) times daily with a meal. 60 capsule 2  . Melatonin 5  MG CAPS Take 5 mg by mouth at bedtime.    . metoprolol tartrate (LOPRESSOR) 25 MG tablet Take 1 tablet (25 mg total) by mouth 2 (two) times daily. 60 tablet 1  . Multiple Vitamin (MULTIVITAMIN WITH MINERALS) TABS tablet Take 1 tablet by mouth daily.    Marland Kitchen omeprazole (PRILOSEC) 40 MG capsule Take 1 capsule (40 mg total) by mouth daily. 90 capsule 1  . oxyCODONE (OXY IR/ROXICODONE) 5 MG immediate release tablet Take 1 tablet (5 mg total) by mouth every 6 (six) hours as needed for severe pain. 30 tablet 0  . potassium chloride SA (KLOR-CON) 20 MEQ tablet 2 in am and 1 at night. 270 tablet 1  . pravastatin (PRAVACHOL) 40 MG tablet Take 1 tablet (40 mg total) by mouth at bedtime. 90 tablet 1  . PRESCRIPTION MEDICATION Apply 1 application topically 4 (four) times daily as needed (hemorrhoid). Nifedipine 5% ointment  Rx is sent to [facility]. Last called in on 07/19/2020 with 1 yr of refills.    . valsartan (DIOVAN) 320 MG tablet Take 1 tablet (320 mg total) by mouth daily. 90 tablet 1   No current facility-administered medications for this visit.       Physical Exam: BP 123/72   Pulse (!) 51   Resp 20   Ht 5\' 6"  (1.676 m)   Wt 203 lb (92.1 kg)   SpO2 95% Comment: RA  BMI 32.77 kg/m   Wound: Superficial skin dehiscence of previous chest tube site with fibrinous exudate and small amount of surrounding induration.  No purulence noted.   Diagnostic Studies & Laboratory data:     Recent Radiology Findings:   No results found.    Recent Lab Findings: Lab Results  Component Value Date   WBC 12.1 (H) 08/13/2020   HGB 10.6 (L) 08/13/2020   HCT 31.5 (L) 08/13/2020   PLT 193 08/13/2020   GLUCOSE 190 (H) 08/13/2020   CHOL 116 05/09/2020   TRIG 107 05/09/2020   HDL 45 05/09/2020   LDLCALC 51 05/09/2020   ALT 24 08/08/2020   AST 30 08/08/2020   NA 136 08/13/2020   K 4.0 08/13/2020   CL 98 08/13/2020   CREATININE 0.69 08/13/2020   BUN 12 08/13/2020   CO2 28 08/13/2020   INR  1.5 (H) 08/10/2020   HGBA1C 6.5 (H) 08/08/2020  Assessment / Plan:      Superficial skin dehiscence of previous chest tube site.  I irrigated it with saline and debrided gently of some of the fibrinous tissue to a granulomatous base.  Packed with small wet-to-dry gauze.  Instructed husband on dressing changes which he can do once daily.  Patient will continue with current appointment scheduled to see Dr. Roxy Manns in a couple weeks.  They know to call the office prior to that should any further difficulties arise.  Medication Changes: No orders of the defined types were placed in this encounter.     John Giovanni, PA-C 08/25/2020 1:33 PM

## 2020-08-27 ENCOUNTER — Other Ambulatory Visit: Payer: Self-pay | Admitting: Family Medicine

## 2020-08-27 DIAGNOSIS — I10 Essential (primary) hypertension: Secondary | ICD-10-CM

## 2020-08-28 ENCOUNTER — Telehealth: Payer: Self-pay

## 2020-08-28 NOTE — Progress Notes (Addendum)
Chronic Care Management Pharmacy Assistant   Name: YARIANNA VARBLE  MRN: [id number] DOB: [birth year]  Megan Galvan is an 70 y.o. year old female who presents for his follow-up CCM visit with the clinical pharmacist.  Reason for Encounter: Disease State for HTN   Recent office visits:  08/23/2020: Jerrell Belfast, NP (PCP) / Added  Lubiprostone 79mcg. capsule, take 1 capsule bid with meals. D/C Keotifen eye drops, completed course.  Recent consult visits:  08/25/2020: Jadene Pierini, PA-C (Cardio/Thoracic) / S/P AVR (DOS: 08/10/2020) / No medication changes noted   08/17/2020: Richardo Priest, MD (Cardiology) / Added Diltiazem 30 mg. Take 1 po q6h prn if heart rate is >100. Added Eliquis 5 mg. Tablet, take 1 tablet bid.   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    Medications: Outpatient Encounter Medications as of 08/28/2020  Medication Sig   Accu-Chek Softclix Lancets lancets Use as instructed   acetaminophen (TYLENOL) 500 MG tablet Take 1,000 mg by mouth every 6 (six) hours as needed for moderate pain or headache.   amiodarone (PACERONE) 200 MG tablet Take 1 tablet (200 mg total) by mouth 2 (two) times daily.   apixaban (ELIQUIS) 5 MG TABS tablet Take 1 tablet (5 mg total) by mouth 2 (two) times daily.   Calcium Carb-Cholecalciferol (CALCIUM 600 + D PO) Take 2 tablets by mouth daily.   cetirizine (ZYRTEC) 10 MG tablet Take 10 mg by mouth daily.   chlorthalidone (HYGROTON) 50 MG tablet TAKE 1 TABLET BY MOUTH  DAILY   conjugated estrogens (PREMARIN) vaginal cream Place 1 Applicatorful vaginally 2 (two) times a week.   diltiazem (CARDIZEM) 30 MG tablet Take 1 tablet (30 mg total) by mouth every 6 (six) hours as needed (If your heart rate is greater than 100.).   famotidine (PEPCID) 40 MG tablet TAKE 1 TABLET BY MOUTH AT  BEDTIME   fluticasone (FLONASE) 50 MCG/ACT nasal spray Place 1 spray into  the nose daily as needed for allergies.   glucose blood (ACCU-CHEK AVIVA PLUS) test strip CHECK BLOOD SUGAR ONCE  DAILY   hydrocortisone (ANUSOL-HC) 25 MG suppository Place 1 suppository (25 mg total) rectally 2 (two) times daily.   linagliptin (TRADJENTA) 5 MG TABS tablet Take 1 tablet (5 mg total) by mouth daily.   lubiprostone (AMITIZA) 24 MCG capsule Take 1 capsule (24 mcg total) by mouth 2 (two) times daily with a meal.   Melatonin 5 MG CAPS Take 5 mg by mouth at bedtime.   metoprolol tartrate (LOPRESSOR) 25 MG tablet Take 1 tablet (25 mg total) by mouth 2 (two) times daily.   Multiple Vitamin (MULTIVITAMIN WITH MINERALS) TABS tablet Take 1 tablet by mouth daily.   omeprazole (PRILOSEC) 40 MG capsule Take 1 capsule (40 mg total) by mouth daily.   oxyCODONE (OXY IR/ROXICODONE) 5 MG immediate release tablet Take 1 tablet (5 mg total) by mouth every 6 (six) hours as needed for severe pain.   potassium chloride SA (KLOR-CON) 20 MEQ tablet 2 in am and 1 at night.   pravastatin (PRAVACHOL) 40 MG tablet Take 1 tablet (40 mg total) by mouth at bedtime.   PRESCRIPTION MEDICATION Apply 1 application topically 4 (four) times daily as needed (hemorrhoid). Nifedipine 5% ointment  Rx is sent to [facility]. Last called in on 07/19/2020 with 1 yr of refills.   valsartan (DIOVAN) 320 MG tablet Take 1 tablet (320  mg total) by mouth daily.   No facility-administered encounter medications on file as of 08/28/2020.    Recent Office Vitals: BP Readings from Last 3 Encounters:  08/25/20 123/72  08/23/20 132/68  08/21/20 124/60   Pulse Readings from Last 3 Encounters:  08/25/20 (!) 51  08/23/20 (!) 51  08/21/20 (!) 57    Wt Readings from Last 3 Encounters:  08/25/20 203 lb (92.1 kg)  08/23/20 204 lb (92.5 kg)  08/21/20 205 lb (93 kg)     Kidney Function Lab Results  Component Value Date/Time   CREATININE 0.69 08/13/2020 05:29 AM   CREATININE 0.61 08/12/2020 04:50 AM   GFRNONAA >60  08/13/2020 05:29 AM   GFRAA 84 05/09/2020 11:32 AM    BMP Latest Ref Rng & Units 08/13/2020 08/12/2020 08/11/2020  Glucose 70 - 99 mg/dL 190(H) 161(H) 132(H)  BUN 8 - 23 mg/dL 12 9 9   Creatinine 0.44 - 1.00 mg/dL 0.69 0.61 0.71  BUN/Creat Ratio 12 - 28 - - -  Sodium 135 - 145 mmol/L 136 135 134(L)  Potassium 3.5 - 5.1 mmol/L 4.0 3.7 3.5  Chloride 98 - 111 mmol/L 98 95(L) 99  CO2 22 - 32 mmol/L 28 32 32  Calcium 8.9 - 10.3 mg/dL 8.9 8.3(L) 7.9(L)     Current antihypertensive regimen:  Metoprolol tartrate 25 mg. Tablet: 1 tablet po bid Valsartan 320 mg. Tablet: 1 tablet po qd  Patient verbally confirmed that she is taking medications as prescribed.    How often are you checking your Blood Pressure?  Patient stated she occasionally checks her blood pressure, but not on a daily basis. I encouraged her to monitor it more often, given her cardiac history and recent AVR, and she stated that she will be glad to do that.    Current home BP readings:   DATE:             BP               PULSE 08/30/2020 128/70  ---      Wrist or arm cuff:arm cuff   Caffeine intake: Patient stated that she drinks mostly water   Salt intake: Patient stated that she does not add extra salt to her food.   OTC medications including pseudoephedrine or NSAIDs?  N/A   Any readings above 180/120? No and sx of hypertensive emergency.    Any recent hospitalizations or ED visits since last visit with CPP?  S/P AVR (DOS: 08/10/2020)   What diet changes have been made to improve Blood Pressure Control?   Adhering to low sodium diet, not adding salt to food, drinking a lot of H2O.    What exercise is being done to improve your Blood Pressure Control?  Patient stated that she is still unable to exercise since recent heart surgery. She reported that she is very tired and not sleeping well. She stated that she has to get up in the middle of the night to go to the bathroom and is unable to go right back to  sleep.  She also stated that she is experiencing severe constipation and recently started taking Lubiprostone which is helping a little. She stated that 1x daily is not quite enough, and the provider recommended 2x a day keeps her in the bathroom all day. She is going to try 2x a day every other day, and increasing water intake to see if that helps.    Adherence Review: Is the patient currently on ACE/ARB medication? Yes,  patient stated that she is taking Valsartan as prescribed.   Does the patient have >5 day gap between last estimated fill dates? Patient does not have >5 day gap between estimated fill dates.  CPP to review   Star Rating Drugs:  Medication:  Last Fill: Day Supply Pravastatin  07/25/2020 90DS Valsartan  07/12/2020 [place], [place] Clinical Pharmacist Assistant

## 2020-08-30 DIAGNOSIS — R519 Headache, unspecified: Secondary | ICD-10-CM | POA: Insufficient documentation

## 2020-08-31 ENCOUNTER — Other Ambulatory Visit: Payer: Self-pay

## 2020-08-31 ENCOUNTER — Ambulatory Visit (INDEPENDENT_AMBULATORY_CARE_PROVIDER_SITE_OTHER): Payer: Medicare Other | Admitting: Family Medicine

## 2020-08-31 VITALS — BP 110/74 | HR 56 | Temp 96.8°F | Resp 16 | Ht 66.0 in | Wt 202.0 lb

## 2020-08-31 DIAGNOSIS — I4891 Unspecified atrial fibrillation: Secondary | ICD-10-CM | POA: Diagnosis not present

## 2020-08-31 DIAGNOSIS — Z09 Encounter for follow-up examination after completed treatment for conditions other than malignant neoplasm: Secondary | ICD-10-CM

## 2020-08-31 DIAGNOSIS — I350 Nonrheumatic aortic (valve) stenosis: Secondary | ICD-10-CM | POA: Diagnosis not present

## 2020-08-31 DIAGNOSIS — I9789 Other postprocedural complications and disorders of the circulatory system, not elsewhere classified: Secondary | ICD-10-CM

## 2020-08-31 DIAGNOSIS — I10 Essential (primary) hypertension: Secondary | ICD-10-CM | POA: Diagnosis not present

## 2020-08-31 DIAGNOSIS — E1142 Type 2 diabetes mellitus with diabetic polyneuropathy: Secondary | ICD-10-CM

## 2020-08-31 DIAGNOSIS — Z953 Presence of xenogenic heart valve: Secondary | ICD-10-CM

## 2020-08-31 MED ORDER — HYDROCORTISONE ACETATE 25 MG RE SUPP: 25.0000 mg | Freq: Two times a day (BID) | RECTAL | 2 refills | Status: DC

## 2020-08-31 NOTE — Progress Notes (Signed)
Subjective:  Patient ID: Megan Galvan, female    DOB: 05-Apr-1951  Age: 70 y.o. MRN: [id number]  Chief Complaint  Patient presents with  . Hospitalization Follow-up    HPI Patient is a 70 year old white female who presents for hospital follow-up cardiac valve replacement.  Patient was admitted from August 10, 2020 to August 15, 2020 at [facility]  Prior to her surgery she had progressively worsening shortness of breath and chest tightness both at rest and with mild exertion.  She also had nocturnal shortness of breath requiring her to avoid laying flat in bed.  She also had some dizziness without syncope.  Dr. Bettina Gavia did the initial cardiac evaluation followed by a left heart cath by Dr. Burt Knack which showed severe aortic stenosis but no coronary artery disease.  She was then evaluated by CVTS Dr. Roxy Manns who admitted her and she underwent Aortic Valve replacement with a 21 mm Edwards Resilia Bioprosthetic tissue valve.  Postop complications include clued atrial fibrillation postop day 3.  This was controlled with amiodarone bolus and drip then she was converted to pills.  She remained in normal sinus rhythm unless she has other episodes she will not require anticoagulation.  She had elevated blood pressure postop and was given IV nitroglycerin and then transitioned over to oral antihypertensives.  She has done well postop.  Her incision has been healing well.  She had 1 area where a chest tube was placed that became a little erythematous and she required an antibiotic for a brief period of time which was prescribed by cardiology. She has had a decreased appetite. Fatigue, shortness of breath, and dizziness have resolved. She has some chest soreness which is appropriate.  Patient has already been seen by cardiology following her discharge.   Current Outpatient Medications on File Prior to Visit  Medication Sig Dispense Refill  . Accu-Chek Softclix Lancets lancets Use as instructed 100 each 12   . acetaminophen (TYLENOL) 500 MG tablet Take 1,000 mg by mouth every 6 (six) hours as needed for moderate pain or headache.    Marland Kitchen amiodarone (PACERONE) 200 MG tablet Take 1 tablet (200 mg total) by mouth 2 (two) times daily. 60 tablet 2  . apixaban (ELIQUIS) 5 MG TABS tablet Take 1 tablet (5 mg total) by mouth 2 (two) times daily. 60 tablet 0  . Calcium Carb-Cholecalciferol (CALCIUM 600 + D PO) Take 2 tablets by mouth daily.    . cetirizine (ZYRTEC) 10 MG tablet Take 10 mg by mouth daily.    . chlorthalidone (HYGROTON) 50 MG tablet TAKE 1 TABLET BY MOUTH  DAILY 90 tablet 3  . conjugated estrogens (PREMARIN) vaginal cream Place 1 Applicatorful vaginally 2 (two) times a week. 42.5 g 2  . diltiazem (CARDIZEM) 30 MG tablet Take 1 tablet (30 mg total) by mouth every 6 (six) hours as needed (If your heart rate is greater than 100.). 30 tablet 3  . famotidine (PEPCID) 40 MG tablet TAKE 1 TABLET BY MOUTH AT  BEDTIME 90 tablet 3  . fluticasone (FLONASE) 50 MCG/ACT nasal spray Place 1 spray into the nose daily as needed for allergies.    Marland Kitchen glucose blood (ACCU-CHEK AVIVA PLUS) test strip CHECK BLOOD SUGAR ONCE  DAILY 100 strip 3  . linagliptin (TRADJENTA) 5 MG TABS tablet Take 1 tablet (5 mg total) by mouth daily. 30 tablet 0  . lubiprostone (AMITIZA) 24 MCG capsule Take 1 capsule (24 mcg total) by mouth 2 (two) times daily with a  meal. 60 capsule 2  . Melatonin 5 MG CAPS Take 5 mg by mouth at bedtime.    . metoprolol tartrate (LOPRESSOR) 25 MG tablet Take 1 tablet (25 mg total) by mouth 2 (two) times daily. 60 tablet 1  . Multiple Vitamin (MULTIVITAMIN WITH MINERALS) TABS tablet Take 1 tablet by mouth daily.    Marland Kitchen omeprazole (PRILOSEC) 40 MG capsule Take 1 capsule (40 mg total) by mouth daily. 90 capsule 1  . oxyCODONE (OXY IR/ROXICODONE) 5 MG immediate release tablet Take 1 tablet (5 mg total) by mouth every 6 (six) hours as needed for severe pain. 30 tablet 0  . potassium chloride SA (KLOR-CON) 20 MEQ  tablet 2 in am and 1 at night. 270 tablet 1  . pravastatin (PRAVACHOL) 40 MG tablet Take 1 tablet (40 mg total) by mouth at bedtime. 90 tablet 1  . PRESCRIPTION MEDICATION Apply 1 application topically 4 (four) times daily as needed (hemorrhoid). Nifedipine 5% ointment  Rx is sent to [facility]. Last called in on 07/19/2020 with 1 yr of refills.    . valsartan (DIOVAN) 320 MG tablet Take 1 tablet (320 mg total) by mouth daily. 90 tablet 1   No current facility-administered medications on file prior to visit.   Past Medical History:  Diagnosis Date  . Allergy   . Anemia   . Arthritis   . Bone spur of acromioclavicular joint, left   . Cataract   . Diabetes mellitus without complication (HCC)   . GERD (gastroesophageal reflux disease)   . Headache   . Heart murmur   . Hyperlipidemia   . Hypertension   . Nonrheumatic aortic (valve) stenosis   . Plantar fasciitis   . S/P aortic valve replacement with bioprosthetic valve 08/10/2020   21 mm Edwards Resilia Inspiria Bioprosthetic Tissue Valve  SN [id number] Model [id number]  . Sleep apnea    cpap   Past Surgical History:  Procedure Laterality Date  . ANTERIOR CERVICAL DECOMP/DISCECTOMY FUSION     x 2  . AORTIC VALVE REPLACEMENT N/A 08/10/2020   Procedure: AORTIC VALVE REPLACEMENT (AVR) USING INSPIRIS VALVE SIZE ;  Surgeon: Purcell Nails, MD;  Location[facility] OR;  Service: Open Heart Surgery;  Laterality: N/A;  . BACK SURGERY  11/17/2016   L3-L5  . BILATERAL CARPAL TUNNEL RELEASE    . bone spur Left    left shoulder  . bone spur Right    Right shoulder  . COLONOSCOPY  08/22/2011   Moderate predominantly sigmoid diverticulosis. Small internal hemorrhoids.   . COLONOSCOPY    . HEMORROIDECTOMY  02/2019  . NECK SURGERY    . RIGHT/LEFT HEART CATH AND CORONARY ANGIOGRAPHY N/A 07/27/2020   Procedure: RIGHT/LEFT HEART CATH AND CORONARY ANGIOGRAPHY;  Surgeon: Tonny Bollman, MD;  Location: [facility] INVASIVE CV LAB;  Service:  Cardiovascular;  Laterality: N/A;  . TEE WITHOUT CARDIOVERSION N/A 08/10/2020   Procedure: TRANSESOPHAGEAL ECHOCARDIOGRAM (TEE);  Surgeon: Purcell Nails, MD;  Location: [facility] OR;  Service: Open Heart Surgery;  Laterality: N/A;  . torn rotator cuff Right    Right shoulder  . torn rotator cuff Left    left shoulder  . TRIGGER FINGER RELEASE Left    thumb  . TRIGGER FINGER RELEASE Right    thumb and middle finger  . UPPER GASTROINTESTINAL ENDOSCOPY    . WRIST SURGERY Left    torn ligament  . WRIST SURGERY Right    cyst    Family History  Problem Relation Age  of Onset  . Colon cancer Paternal Grandmother   . Esophageal cancer Neg Hx   . Rectal cancer Neg Hx   . Stomach cancer Neg Hx    Social History   Socioeconomic History  . Marital status: Married    Spouse name: Not on file  . Number of children: 2  . Years of education: Not on file  . Highest education level: Not on file  Occupational History  . Not on file  Tobacco Use  . Smoking status: Never Smoker  . Smokeless tobacco: Never Used  Vaping Use  . Vaping Use: Never used  Substance and Sexual Activity  . Alcohol use: Never  . Drug use: Never  . Sexual activity: Not on file  Other Topics Concern  . Not on file  Social History Narrative  . Not on file   Social Determinants of Health   Financial Resource Strain: Not on file  Food Insecurity: No Food Insecurity  . Worried About Charity fundraiser in the Last Year: Never true  . Ran Out of Food in the Last Year: Never true  Transportation Needs: No Transportation Needs  . Lack of Transportation (Medical): No  . Lack of Transportation (Non-Medical): No  Physical Activity: Not on file  Stress: Not on file  Social Connections: Not on file    Review of Systems  Constitutional: Negative for chills, fatigue and fever.  HENT: Negative for congestion, ear pain and sore throat.   Respiratory: Negative for cough and shortness of breath.   Cardiovascular: Positive  for chest pain (Soreness).  Gastrointestinal: Negative for abdominal pain, constipation, diarrhea, nausea and vomiting.  Genitourinary: Negative for dysuria and urgency.  Musculoskeletal: Negative for arthralgias and myalgias.  Skin: Negative for rash.  Neurological: Negative for dizziness and headaches.  Psychiatric/Behavioral: Negative for dysphoric mood. The patient is not nervous/anxious.      Objective:  BP 110/74   Pulse (!) 56   Temp (!) 96.8 F (36 C)   Resp 16   Ht 5\' 6"  (1.676 m)   Wt 202 lb (91.6 kg)   BMI 32.60 kg/m   BP/Weight 08/31/2020 08/25/2020 05/23/5807  Systolic BP 983 382 505  Diastolic BP 74 72 68  Wt. (Lbs) 202 203 204  BMI 32.6 32.77 32.93    Physical Exam Vitals reviewed.  Constitutional:      Appearance: Normal appearance. She is normal weight.  Neck:     Vascular: No carotid bruit.  Cardiovascular:     Rate and Rhythm: Normal rate and regular rhythm.     Pulses: Normal pulses.     Heart sounds: Normal heart sounds.  Pulmonary:     Effort: Pulmonary effort is normal. No respiratory distress.     Breath sounds: Normal breath sounds.  Abdominal:     General: Abdomen is flat. Bowel sounds are normal.     Palpations: Abdomen is soft.     Tenderness: There is no abdominal tenderness.  Skin:    Comments: Skin around left-sided abdominal chest tube is mildly erythematous but does not appear infected.  Neurological:     Mental Status: She is alert and oriented to person, place, and time.  Psychiatric:        Mood and Affect: Mood normal.        Behavior: Behavior normal.     Diabetic Foot Exam - Simple   No data filed      Lab Results  Component Value Date   WBC 12.1 (  H) 08/13/2020   HGB 10.6 (L) 08/13/2020   HCT 31.5 (L) 08/13/2020   PLT 193 08/13/2020   GLUCOSE 190 (H) 08/13/2020   CHOL 116 05/09/2020   TRIG 107 05/09/2020   HDL 45 05/09/2020   LDLCALC 51 05/09/2020   ALT 24 08/08/2020   AST 30 08/08/2020   NA 136 08/13/2020    K 4.0 08/13/2020   CL 98 08/13/2020   CREATININE 0.69 08/13/2020   BUN 12 08/13/2020   CO2 28 08/13/2020   INR 1.5 (H) 08/10/2020   HGBA1C 6.5 (H) 08/08/2020   MICROALBUR 10 05/09/2020      Assessment & Plan:   1. Type 2 diabetes mellitus with diabetic polyneuropathy, without long-term current use of insulin (HCC) Continue tradjenta.  Recommend continue to work on eating healthy diet and exercise. - CBC with Differential/Platelet - Comprehensive metabolic panel  2. Follow-up visit for aortic valve replacement with bioprosthetic valve No issues.   3. Nonrheumatic aortic valve stenosis Resolved with surgery  4. Essential hypertension Well controlled.   5. Postopaerative atrial fibrillation Converted. Continue on amiodarone until cardiology disconintinues.   Meds ordered this encounter  Medications  . hydrocortisone (ANUSOL-HC) 25 MG suppository    Sig: Place 1 suppository (25 mg total) rectally 2 (two) times daily.    Dispense:  30 suppository    Refill:  2    Orders Placed This Encounter  Procedures  . CBC with Differential/Platelet  . Comprehensive metabolic panel     Follow-up: No follow-ups on file.  An After Visit Summary was printed and given to the patient.  Rochel Brome, MD [facility] [phone]

## 2020-09-01 LAB — CBC WITH DIFFERENTIAL/PLATELET
Basophils Absolute: 0.1 10*3/uL (ref 0.0–0.2)
Basos: 1 %
EOS (ABSOLUTE): 0.1 10*3/uL (ref 0.0–0.4)
Eos: 2 %
Hematocrit: 33.7 % — ABNORMAL LOW (ref 34.0–46.6)
Hemoglobin: 10.7 g/dL — ABNORMAL LOW (ref 11.1–15.9)
Immature Grans (Abs): 0 10*3/uL (ref 0.0–0.1)
Immature Granulocytes: 0 %
Lymphocytes Absolute: 2.1 10*3/uL (ref 0.7–3.1)
Lymphs: 29 %
MCH: 30.8 pg (ref 26.6–33.0)
MCHC: 31.8 g/dL (ref 31.5–35.7)
MCV: 97 fL (ref 79–97)
Monocytes Absolute: 0.9 10*3/uL (ref 0.1–0.9)
Monocytes: 12 %
Neutrophils Absolute: 4.2 10*3/uL (ref 1.4–7.0)
Neutrophils: 56 %
Platelets: 284 10*3/uL (ref 150–450)
RBC: 3.47 x10E6/uL — ABNORMAL LOW (ref 3.77–5.28)
RDW: 14.1 % (ref 11.7–15.4)
WBC: 7.4 10*3/uL (ref 3.4–10.8)

## 2020-09-01 LAB — COMPREHENSIVE METABOLIC PANEL
ALT: 16 IU/L (ref 0–32)
AST: 21 IU/L (ref 0–40)
Albumin/Globulin Ratio: 1.4 (ref 1.2–2.2)
Albumin: 4 g/dL (ref 3.8–4.8)
Alkaline Phosphatase: 115 IU/L (ref 44–121)
BUN/Creatinine Ratio: 14 (ref 12–28)
BUN: 15 mg/dL (ref 8–27)
Bilirubin Total: 0.3 mg/dL (ref 0.0–1.2)
CO2: 26 mmol/L (ref 20–29)
Calcium: 9.3 mg/dL (ref 8.7–10.3)
Chloride: 99 mmol/L (ref 96–106)
Creatinine, Ser: 1.05 mg/dL — ABNORMAL HIGH (ref 0.57–1.00)
Globulin, Total: 2.9 g/dL (ref 1.5–4.5)
Glucose: 97 mg/dL (ref 65–99)
Potassium: 4.1 mmol/L (ref 3.5–5.2)
Sodium: 139 mmol/L (ref 134–144)
Total Protein: 6.9 g/dL (ref 6.0–8.5)
eGFR: 58 mL/min/{1.73_m2} — ABNORMAL LOW (ref >59)

## 2020-09-03 ENCOUNTER — Encounter: Payer: Self-pay | Admitting: Family Medicine

## 2020-09-05 ENCOUNTER — Other Ambulatory Visit: Payer: Self-pay | Admitting: Family Medicine

## 2020-09-05 DIAGNOSIS — I10 Essential (primary) hypertension: Secondary | ICD-10-CM

## 2020-09-06 ENCOUNTER — Other Ambulatory Visit: Payer: Self-pay | Admitting: Physician Assistant

## 2020-09-06 ENCOUNTER — Ambulatory Visit: Payer: Medicare Other | Admitting: Cardiology

## 2020-09-07 ENCOUNTER — Other Ambulatory Visit: Payer: Self-pay | Admitting: Thoracic Surgery (Cardiothoracic Vascular Surgery)

## 2020-09-07 DIAGNOSIS — Z952 Presence of prosthetic heart valve: Secondary | ICD-10-CM

## 2020-09-11 ENCOUNTER — Other Ambulatory Visit: Payer: Self-pay

## 2020-09-11 ENCOUNTER — Ambulatory Visit (INDEPENDENT_AMBULATORY_CARE_PROVIDER_SITE_OTHER): Payer: Medicare Other | Admitting: Family Medicine

## 2020-09-11 ENCOUNTER — Ambulatory Visit (INDEPENDENT_AMBULATORY_CARE_PROVIDER_SITE_OTHER): Payer: Self-pay | Admitting: Physician Assistant

## 2020-09-11 ENCOUNTER — Encounter: Payer: Self-pay | Admitting: Family Medicine

## 2020-09-11 ENCOUNTER — Ambulatory Visit: Payer: Medicare Other | Admitting: Thoracic Surgery (Cardiothoracic Vascular Surgery)

## 2020-09-11 ENCOUNTER — Ambulatory Visit
Admission: RE | Admit: 2020-09-11 | Discharge: 2020-09-11 | Disposition: A | Discharge status: Home / Self Care | Payer: Medicare Other | Source: Ambulatory Visit | Attending: Thoracic Surgery (Cardiothoracic Vascular Surgery) | Admitting: Thoracic Surgery (Cardiothoracic Vascular Surgery)

## 2020-09-11 VITALS — BP 130/64 | HR 60 | Temp 96.7°F | Resp 16 | Ht 66.0 in | Wt 203.6 lb

## 2020-09-11 VITALS — BP 147/71 | HR 55 | Resp 20 | Ht 66.0 in | Wt 207.0 lb

## 2020-09-11 DIAGNOSIS — E1142 Type 2 diabetes mellitus with diabetic polyneuropathy: Secondary | ICD-10-CM | POA: Diagnosis not present

## 2020-09-11 DIAGNOSIS — I4891 Unspecified atrial fibrillation: Secondary | ICD-10-CM

## 2020-09-11 DIAGNOSIS — D62 Acute posthemorrhagic anemia: Secondary | ICD-10-CM | POA: Diagnosis not present

## 2020-09-11 DIAGNOSIS — I9789 Other postprocedural complications and disorders of the circulatory system, not elsewhere classified: Secondary | ICD-10-CM

## 2020-09-11 DIAGNOSIS — Z952 Presence of prosthetic heart valve: Secondary | ICD-10-CM

## 2020-09-11 DIAGNOSIS — K219 Gastro-esophageal reflux disease without esophagitis: Secondary | ICD-10-CM | POA: Diagnosis not present

## 2020-09-11 DIAGNOSIS — I10 Essential (primary) hypertension: Secondary | ICD-10-CM

## 2020-09-11 DIAGNOSIS — E782 Mixed hyperlipidemia: Secondary | ICD-10-CM

## 2020-09-11 DIAGNOSIS — I350 Nonrheumatic aortic (valve) stenosis: Secondary | ICD-10-CM

## 2020-09-11 MED ORDER — LINAGLIPTIN 5 MG PO TABS: 5.0000 mg | ORAL_TABLET | Freq: Every day | ORAL | 0 refills | Status: DC

## 2020-09-11 MED ORDER — AMIODARONE HCL 200 MG PO TABS: 200.0000 mg | ORAL_TABLET | Freq: Every day | ORAL | 2 refills | Status: DC

## 2020-09-11 NOTE — Patient Instructions (Signed)
Endocarditis is a potentially serious infection of heart valves or inside lining of the heart.  It occurs more commonly in patients with diseased heart valves (such as patient's with aortic or mitral valve disease) and in patients who have undergone heart valve repair or replacement.  Certain surgical and dental procedures may put you at risk, such as dental cleaning, other dental procedures, or any surgery involving the respiratory, urinary, gastrointestinal tract, gallbladder or prostate gland.   To minimize your chances for develooping endocarditis, maintain good oral health and seek prompt medical attention for any infections involving the mouth, teeth, gums, skin or urinary tract.    Always notify your doctor or dentist about your underlying heart valve condition before having any invasive procedures. You will need to take antibiotics before certain procedures, including all routine dental cleanings or other dental procedures.  Your cardiologist or dentist should prescribe these antibiotics for you to be taken ahead of time.  Make every effort to maintain a "heart-healthy" lifestyle with regular physical exercise and adherence to a low-fat, low-carbohydrate diet.  Continue to seek regular follow-up appointments with your primary care physician and/or cardiologist.  You may continue to gradually increase your physical activity as tolerated.  Refrain from any heavy lifting or strenuous use of your arms and shoulders until at least 8 weeks from the time of your surgery, and avoid activities that cause increased pain in your chest on the side of your surgical incision.  Otherwise you may continue to increase activities without any particular limitations.  Increase the intensity and duration of physical activity gradually.       

## 2020-09-11 NOTE — Progress Notes (Signed)
Subjective:  Patient ID: Megan Galvan, female    DOB: May 16, 1950  Age: 70 y.o. MRN: [id number]  Chief Complaint  Patient presents with  . Diabetes  . Hypertension  . Gastroesophageal Reflux    HPI Type 2 diabetes mellitus with diabetic polyneuropathy, without long-term current use of insulin (HCC) Tradjenta, checks FBS 115-239 and checks feet daily last A1c was 6.5. Eating healthy, not exercising much due to recent aortic valve replacement.  Essential hypertension Takes chlorthalidone, metoprolol, valsartan and cardizem  Mixed hyperlipidemia Maintains healthy diet while taking pravastatin  Gastro-esophageal reflux disease without esophagitis Controlled with famotidine and omeprazole  A-fib (Postoperative) Taking eliquis and amiodarone.  Current Outpatient Medications on File Prior to Visit  Medication Sig Dispense Refill  . Accu-Chek Softclix Lancets lancets Use as instructed 100 each 12  . acetaminophen (TYLENOL) 500 MG tablet Take 1,000 mg by mouth every 6 (six) hours as needed for moderate pain or headache.    Marland Kitchen apixaban (ELIQUIS) 5 MG TABS tablet Take 1 tablet (5 mg total) by mouth 2 (two) times daily. 60 tablet 0  . Calcium Carb-Cholecalciferol (CALCIUM 600 + D PO) Take 2 tablets by mouth daily.    . cetirizine (ZYRTEC) 10 MG tablet Take 10 mg by mouth daily.    . chlorthalidone (HYGROTON) 50 MG tablet TAKE 1 TABLET BY MOUTH  DAILY 90 tablet 3  . conjugated estrogens (PREMARIN) vaginal cream Place 1 Applicatorful vaginally 2 (two) times a week. 42.5 g 2  . diltiazem (CARDIZEM) 30 MG tablet Take 1 tablet (30 mg total) by mouth every 6 (six) hours as needed (If your heart rate is greater than 100.). 30 tablet 3  . famotidine (PEPCID) 40 MG tablet TAKE 1 TABLET BY MOUTH AT  BEDTIME 90 tablet 3  . fluticasone (FLONASE) 50 MCG/ACT nasal spray Place 1 spray into the nose daily as needed for allergies.    Marland Kitchen glucose blood (ACCU-CHEK AVIVA PLUS) test strip CHECK BLOOD SUGAR  ONCE  DAILY 100 strip 3  . hydrocortisone (ANUSOL-HC) 25 MG suppository Place 1 suppository (25 mg total) rectally 2 (two) times daily. 30 suppository 2  . lubiprostone (AMITIZA) 24 MCG capsule Take 1 capsule (24 mcg total) by mouth 2 (two) times daily with a meal. 60 capsule 2  . Melatonin 5 MG CAPS Take 5 mg by mouth at bedtime.    . metoprolol tartrate (LOPRESSOR) 25 MG tablet Take 1 tablet (25 mg total) by mouth 2 (two) times daily. 60 tablet 1  . Multiple Vitamin (MULTIVITAMIN WITH MINERALS) TABS tablet Take 1 tablet by mouth daily.    Marland Kitchen omeprazole (PRILOSEC) 40 MG capsule Take 1 capsule (40 mg total) by mouth daily. 90 capsule 1  . oxyCODONE (OXY IR/ROXICODONE) 5 MG immediate release tablet Take 1 tablet (5 mg total) by mouth every 6 (six) hours as needed for severe pain. 30 tablet 0  . potassium chloride SA (KLOR-CON) 20 MEQ tablet 2 in am and 1 at night. 270 tablet 1  . pravastatin (PRAVACHOL) 40 MG tablet Take 1 tablet (40 mg total) by mouth at bedtime. 90 tablet 1  . PRESCRIPTION MEDICATION Apply 1 application topically 4 (four) times daily as needed (hemorrhoid). Nifedipine 5% ointment  Rx is sent to [facility]. Last called in on 07/19/2020 with 1 yr of refills.    . valsartan (DIOVAN) 320 MG tablet TAKE 1 TABLET BY MOUTH  DAILY 90 tablet 3   No current facility-administered medications on file prior to  visit.   Past Medical History:  Diagnosis Date  . Allergy   . Anemia   . Arthritis   . Bone spur of acromioclavicular joint, left   . Cataract   . Diabetes mellitus without complication ([place])   . GERD (gastroesophageal reflux disease)   . Headache   . Heart murmur   . Hyperlipidemia   . Hypertension   . Nonrheumatic aortic (valve) stenosis   . Plantar fasciitis   . S/P aortic valve replacement with bioprosthetic valve 08/10/2020   21 mm Edwards Resilia Inspiria Bioprosthetic Tissue Valve  SN [id number] Model [id number]  . Sleep apnea    cpap   Past Surgical History:   Procedure Laterality Date  . ANTERIOR CERVICAL DECOMP/DISCECTOMY FUSION     x 2  . AORTIC VALVE REPLACEMENT N/A 08/10/2020   Procedure: AORTIC VALVE REPLACEMENT (AVR) USING INSPIRIS VALVE SIZE 21MM;  Surgeon: Rexene Alberts, MD;  Location: [facility];  Service: Open Heart Surgery;  Laterality: N/A;  . BACK SURGERY  11/17/2016   L3-L5  . BILATERAL CARPAL TUNNEL RELEASE    . bone spur Left    left shoulder  . bone spur Right    Right shoulder  . COLONOSCOPY  08/22/2011   Moderate predominantly sigmoid diverticulosis. Small internal hemorrhoids.   . COLONOSCOPY    . HEMORROIDECTOMY  02/2019  . NECK SURGERY    . RIGHT/LEFT HEART CATH AND CORONARY ANGIOGRAPHY N/A 07/27/2020   Procedure: RIGHT/LEFT HEART CATH AND CORONARY ANGIOGRAPHY;  Surgeon: Sherren Mocha, MD;  Location: [facility];  Service: Cardiovascular;  Laterality: N/A;  . TEE WITHOUT CARDIOVERSION N/A 08/10/2020   Procedure: TRANSESOPHAGEAL ECHOCARDIOGRAM (TEE);  Surgeon: Rexene Alberts, MD;  Location: [facility];  Service: Open Heart Surgery;  Laterality: N/A;  . torn rotator cuff Right    Right shoulder  . torn rotator cuff Left    left shoulder  . TRIGGER FINGER RELEASE Left    thumb  . TRIGGER FINGER RELEASE Right    thumb and middle finger  . UPPER GASTROINTESTINAL ENDOSCOPY    . WRIST SURGERY Left    torn ligament  . WRIST SURGERY Right    cyst    Family History  Problem Relation Age of Onset  . Colon cancer Paternal Grandmother   . Esophageal cancer Neg Hx   . Rectal cancer Neg Hx   . Stomach cancer Neg Hx    Social History   Socioeconomic History  . Marital status: Married    Spouse name: Not on file  . Number of children: 2  . Years of education: Not on file  . Highest education level: Not on file  Occupational History  . Not on file  Tobacco Use  . Smoking status: Never Smoker  . Smokeless tobacco: Never Used  Vaping Use  . Vaping Use: Never used  Substance and Sexual Activity  . Alcohol use:  Never  . Drug use: Never  . Sexual activity: Not on file  Other Topics Concern  . Not on file  Social History Narrative  . Not on file   Social Determinants of Health   Financial Resource Strain: Not on file  Food Insecurity: No Food Insecurity  . Worried About Charity fundraiser in the Last Year: Never true  . Ran Out of Food in the Last Year: Never true  Transportation Needs: No Transportation Needs  . Lack of Transportation (Medical): No  . Lack of Transportation (Non-Medical): No  Physical Activity:  Not on file  Stress: Not on file  Social Connections: Not on file    Review of Systems  Constitutional: Negative for chills, fatigue (Improving) and fever.  HENT: Negative for congestion, ear pain, rhinorrhea and sore throat.   Eyes: Positive for visual disturbance (Scheduled for cataract removal).  Respiratory: Negative for cough and shortness of breath (Improving).   Cardiovascular: Negative for chest pain.  Gastrointestinal: Positive for constipation (improving). Negative for abdominal pain, diarrhea, nausea and vomiting.  Endocrine: Negative for polydipsia and polyphagia.  Genitourinary: Negative for dysuria and urgency.  Musculoskeletal: Positive for back pain. Negative for myalgias.  Neurological: Negative for dizziness, weakness, light-headedness and headaches.  Psychiatric/Behavioral: Negative for dysphoric mood. The patient is not nervous/anxious.      Objective:  BP 130/64   Pulse 60   Temp (!) 96.7 F (35.9 C)   Resp 16   Ht 5\' 6"  (1.676 m)   Wt 203 lb 9.6 oz (92.4 kg)   BMI 32.86 kg/m   BP/Weight 09/11/2020 2/54/2706 06/01/7626  Systolic BP 315 176 160  Diastolic BP 71 64 74  Wt. (Lbs) 207 203.6 202  BMI 33.41 32.86 32.6    Physical Exam Vitals reviewed.  Constitutional:      Appearance: Normal appearance. She is obese.  Neck:     Vascular: No carotid bruit.  Cardiovascular:     Rate and Rhythm: Normal rate and regular rhythm.     Pulses: Normal  pulses.     Heart sounds: Normal heart sounds.  Pulmonary:     Effort: Pulmonary effort is normal. No respiratory distress.     Breath sounds: Normal breath sounds.  Abdominal:     General: Abdomen is flat. Bowel sounds are normal.     Palpations: Abdomen is soft.     Tenderness: There is no abdominal tenderness.  Neurological:     Mental Status: She is alert and oriented to person, place, and time.  Psychiatric:        Mood and Affect: Mood normal.        Behavior: Behavior normal.     Diabetic Foot Exam - Simple   Simple Foot Form Diabetic Foot exam was performed with the following findings: Yes 09/11/2020 11:29 AM  Visual Inspection No deformities, no ulcerations, no other skin breakdown bilaterally: Yes Sensation Testing Pulse Check Posterior Tibialis and Dorsalis pulse intact bilaterally: Yes Comments      Lab Results  Component Value Date   WBC 7.4 08/31/2020   HGB 10.7 (L) 08/31/2020   HCT 33.7 (L) 08/31/2020   PLT 284 08/31/2020   GLUCOSE 97 08/31/2020   CHOL 116 05/09/2020   TRIG 107 05/09/2020   HDL 45 05/09/2020   LDLCALC 51 05/09/2020   ALT 16 08/31/2020   AST 21 08/31/2020   NA 139 08/31/2020   K 4.1 08/31/2020   CL 99 08/31/2020   CREATININE 1.05 (H) 08/31/2020   BUN 15 08/31/2020   CO2 26 08/31/2020   INR 1.5 (H) 08/10/2020   HGBA1C 6.5 (H) 08/08/2020   MICROALBUR 10 05/09/2020      Assessment & Plan:   1. Type 2 diabetes mellitus with diabetic polyneuropathy, without long-term current use of insulin (HCC) Recent A1C 6.5.  Continue tradjenta as it appears to be improving her daily sugars.   2. Essential hypertension Well controlled.  No changes to medicines.  Continue to work on eating a healthy diet. Labs drawn today.  - CBC with Differential/Platelet - Comprehensive metabolic panel  3. Mixed hyperlipidemia Well controlled.  No changes to medicines.  Continue to work on eating a healthy diet and exercise.  Labs drawn today.  -  Lipid panel  4. Gastro-esophageal reflux disease without esophagitis The current medical regimen is effective;  continue present plan and medications.  5. Postoperative anemia due to acute blood loss  check cbc.   6. Post op Atrial fibrillation Continue amiodarone and xarelto for now. Hopefully will not need to stay on these  Meds ordered this encounter  Medications  . linagliptin (TRADJENTA) 5 MG TABS tablet    Sig: Take 1 tablet (5 mg total) by mouth daily.    Dispense:  90 tablet    Refill:  0    Orders Placed This Encounter  Procedures  . CBC with Differential/Platelet  . Comprehensive metabolic panel  . Lipid panel     Follow-up: Return in about 3 months (around 12/12/2020) for fasting.  An After Visit Summary was printed and given to the patient.  Rochel Brome, MD [facility] [phone]

## 2020-09-11 NOTE — Progress Notes (Signed)
HPI: Patient returns for routine postoperative follow-up having undergone AVR on 08/10/2020. The patient's early postoperative recovery while in the hospital was notable for HTN requiring NTG drip.  She developed Atrial Fibrillation but converted with Amiodarone.  Since hospital discharge the patient reports she doesn't feel like she is doing as well as she should.  She doesn't have a lot of energy.  She states that she isn't walking at all.  Her weight has been stable.  Her appetite is back to normal.  She is no longer taking narcotic pain medication.  She questions when she can start using her arms more.  Current Outpatient Medications  Medication Sig Dispense Refill  . Accu-Chek Softclix Lancets lancets Use as instructed 100 each 12  . acetaminophen (TYLENOL) 500 MG tablet Take 1,000 mg by mouth every 6 (six) hours as needed for moderate pain or headache.    Marland Kitchen apixaban (ELIQUIS) 5 MG TABS tablet Take 1 tablet (5 mg total) by mouth 2 (two) times daily. 60 tablet 0  . Calcium Carb-Cholecalciferol (CALCIUM 600 + D PO) Take 2 tablets by mouth daily.    . cetirizine (ZYRTEC) 10 MG tablet Take 10 mg by mouth daily.    . chlorthalidone (HYGROTON) 50 MG tablet TAKE 1 TABLET BY MOUTH  DAILY 90 tablet 3  . conjugated estrogens (PREMARIN) vaginal cream Place 1 Applicatorful vaginally 2 (two) times a week. 42.5 g 2  . diltiazem (CARDIZEM) 30 MG tablet Take 1 tablet (30 mg total) by mouth every 6 (six) hours as needed (If your heart rate is greater than 100.). 30 tablet 3  . famotidine (PEPCID) 40 MG tablet TAKE 1 TABLET BY MOUTH AT  BEDTIME 90 tablet 3  . fluticasone (FLONASE) 50 MCG/ACT nasal spray Place 1 spray into the nose daily as needed for allergies.    Marland Kitchen glucose blood (ACCU-CHEK AVIVA PLUS) test strip CHECK BLOOD SUGAR ONCE  DAILY 100 strip 3  . hydrocortisone (ANUSOL-HC) 25 MG suppository Place 1 suppository (25 mg total) rectally 2 (two) times daily. 30 suppository 2  . linagliptin (TRADJENTA) 5 MG  TABS tablet Take 1 tablet (5 mg total) by mouth daily. 90 tablet 0  . lubiprostone (AMITIZA) 24 MCG capsule Take 1 capsule (24 mcg total) by mouth 2 (two) times daily with a meal. 60 capsule 2  . Melatonin 5 MG CAPS Take 5 mg by mouth at bedtime.    . metoprolol tartrate (LOPRESSOR) 25 MG tablet Take 1 tablet (25 mg total) by mouth 2 (two) times daily. 60 tablet 1  . Multiple Vitamin (MULTIVITAMIN WITH MINERALS) TABS tablet Take 1 tablet by mouth daily.    Marland Kitchen omeprazole (PRILOSEC) 40 MG capsule Take 1 capsule (40 mg total) by mouth daily. 90 capsule 1  . oxyCODONE (OXY IR/ROXICODONE) 5 MG immediate release tablet Take 1 tablet (5 mg total) by mouth every 6 (six) hours as needed for severe pain. 30 tablet 0  . potassium chloride SA (KLOR-CON) 20 MEQ tablet 2 in am and 1 at night. 270 tablet 1  . pravastatin (PRAVACHOL) 40 MG tablet Take 1 tablet (40 mg total) by mouth at bedtime. 90 tablet 1  . PRESCRIPTION MEDICATION Apply 1 application topically 4 (four) times daily as needed (hemorrhoid). Nifedipine 5% ointment  Rx is sent to [facility]. Last called in on 07/19/2020 with 1 yr of refills.    . valsartan (DIOVAN) 320 MG tablet TAKE 1 TABLET BY MOUTH  DAILY 90 tablet 3  . amiodarone (  PACERONE) 200 MG tablet Take 1 tablet (200 mg total) by mouth daily. 60 tablet 2   No current facility-administered medications for this visit.    Physical Exam:  BP (!) 147/71 (BP Location: Right Arm, Patient Position: Sitting)   Pulse (!) 55   Resp 20   Ht 5\' 6"  (1.676 m)   Wt 207 lb (93.9 kg)   SpO2 97% Comment: RA  BMI 33.41 kg/m   Gen: no apparent distress Heart: RRR Lungs: CTA bilaterally Ext; + edema, mild Incisions: C/D/I  Diagnostic Tests:  CXR; no significant pleural effusion, no pneumothorax  A/P:  1. CV- Post operative A. Fib, maintaining Sinus Bradycardia- will decrease Amiodarone to 200 mg daily, continue Cardizem, Lopressor 2. Activity- patient hasn't walked since she left  the hospital.  I stressed the importance of taking small walks several times per day.  I told her this will help her recover and increase her energy level.  I told her once she is tolerating a few short walks per day it would be okay to start cardiac rehab 3. Sternal precautions- again went over sternal precautions and importance/purpose.  Gave patient guidance on what she can increase over the next few weeks 4. F/U with Dr. Bettina Gavia on Thursday 5. Dispo- patient stable, decrease Amiodarone due to Bradycardia, needs to increase ambulation, okay for cardiac rehab in the future once her mobilty improves, RTC Prn   Ellwood Handler, PA-C [facility] [phone]

## 2020-09-12 LAB — CBC WITH DIFFERENTIAL/PLATELET
Basophils Absolute: 0.1 10*3/uL (ref 0.0–0.2)
Basos: 1 %
EOS (ABSOLUTE): 0.2 10*3/uL (ref 0.0–0.4)
Eos: 2 %
Hematocrit: 34.3 % (ref 34.0–46.6)
Hemoglobin: 11.1 g/dL (ref 11.1–15.9)
Immature Grans (Abs): 0 10*3/uL (ref 0.0–0.1)
Immature Granulocytes: 0 %
Lymphocytes Absolute: 2.1 10*3/uL (ref 0.7–3.1)
Lymphs: 28 %
MCH: 31.4 pg (ref 26.6–33.0)
MCHC: 32.4 g/dL (ref 31.5–35.7)
MCV: 97 fL (ref 79–97)
Monocytes Absolute: 0.8 10*3/uL (ref 0.1–0.9)
Monocytes: 11 %
Neutrophils Absolute: 4.4 10*3/uL (ref 1.4–7.0)
Neutrophils: 58 %
Platelets: 172 10*3/uL (ref 150–450)
RBC: 3.53 x10E6/uL — ABNORMAL LOW (ref 3.77–5.28)
RDW: 14.1 % (ref 11.7–15.4)
WBC: 7.6 10*3/uL (ref 3.4–10.8)

## 2020-09-12 LAB — COMPREHENSIVE METABOLIC PANEL
ALT: 15 IU/L (ref 0–32)
AST: 22 IU/L (ref 0–40)
Albumin/Globulin Ratio: 1.6 (ref 1.2–2.2)
Albumin: 4.4 g/dL (ref 3.8–4.8)
Alkaline Phosphatase: 77 IU/L (ref 44–121)
BUN/Creatinine Ratio: 13 (ref 12–28)
BUN: 12 mg/dL (ref 8–27)
Bilirubin Total: 0.4 mg/dL (ref 0.0–1.2)
CO2: 28 mmol/L (ref 20–29)
Calcium: 9.6 mg/dL (ref 8.7–10.3)
Chloride: 97 mmol/L (ref 96–106)
Creatinine, Ser: 0.94 mg/dL (ref 0.57–1.00)
Globulin, Total: 2.8 g/dL (ref 1.5–4.5)
Glucose: 103 mg/dL — ABNORMAL HIGH (ref 65–99)
Potassium: 4 mmol/L (ref 3.5–5.2)
Sodium: 139 mmol/L (ref 134–144)
Total Protein: 7.2 g/dL (ref 6.0–8.5)
eGFR: 66 mL/min/{1.73_m2} (ref >59)

## 2020-09-12 LAB — LIPID PANEL
Chol/HDL Ratio: 2.7 ratio (ref 0.0–4.4)
Cholesterol, Total: 119 mg/dL (ref 100–199)
HDL: 44 mg/dL (ref >39)
LDL Chol Calc (NIH): 57 mg/dL (ref 0–99)
Triglycerides: 91 mg/dL (ref 0–149)
VLDL Cholesterol Cal: 18 mg/dL (ref 5–40)

## 2020-09-12 LAB — CARDIOVASCULAR RISK ASSESSMENT

## 2020-09-13 ENCOUNTER — Telehealth: Payer: Self-pay

## 2020-09-13 ENCOUNTER — Other Ambulatory Visit: Payer: Self-pay

## 2020-09-13 DIAGNOSIS — E1142 Type 2 diabetes mellitus with diabetic polyneuropathy: Secondary | ICD-10-CM

## 2020-09-13 MED ORDER — ACCU-CHEK AVIVA PLUS W/DEVICE KIT: PACK | 0 refills | Status: DC

## 2020-09-13 MED ORDER — ACCU-CHEK SOFTCLIX LANCETS MISC: 12 refills | Status: DC

## 2020-09-13 NOTE — Progress Notes (Signed)
    Chronic Care Management Pharmacy Assistant   Name: Megan Galvan  MRN: [id number] DOB: 03-26-1951   Reason for Encounter: PAP for Januvia    Medications: Outpatient Encounter Medications as of 09/13/2020  Medication Sig  . Accu-Chek Softclix Lancets lancets Use as instructed  . acetaminophen (TYLENOL) 500 MG tablet Take 1,000 mg by mouth every 6 (six) hours as needed for moderate pain or headache.  Marland Kitchen amiodarone (PACERONE) 200 MG tablet Take 1 tablet (200 mg total) by mouth daily.  Marland Kitchen apixaban (ELIQUIS) 5 MG TABS tablet Take 1 tablet (5 mg total) by mouth 2 (two) times daily.  . Blood Glucose Monitoring Suppl (ACCU-CHEK AVIVA PLUS) w/Device KIT Use as directed.  . Calcium Carb-Cholecalciferol (CALCIUM 600 + D PO) Take 2 tablets by mouth daily.  . cetirizine (ZYRTEC) 10 MG tablet Take 10 mg by mouth daily.  . chlorthalidone (HYGROTON) 50 MG tablet TAKE 1 TABLET BY MOUTH  DAILY  . conjugated estrogens (PREMARIN) vaginal cream Place 1 Applicatorful vaginally 2 (two) times a week.  . diltiazem (CARDIZEM) 30 MG tablet Take 1 tablet (30 mg total) by mouth every 6 (six) hours as needed (If your heart rate is greater than 100.).  Marland Kitchen famotidine (PEPCID) 40 MG tablet TAKE 1 TABLET BY MOUTH AT  BEDTIME  . fluticasone (FLONASE) 50 MCG/ACT nasal spray Place 1 spray into the nose daily as needed for allergies.  Marland Kitchen glucose blood (ACCU-CHEK AVIVA PLUS) test strip CHECK BLOOD SUGAR ONCE  DAILY  . hydrocortisone (ANUSOL-HC) 25 MG suppository Place 1 suppository (25 mg total) rectally 2 (two) times daily.  Marland Kitchen linagliptin (TRADJENTA) 5 MG TABS tablet Take 1 tablet (5 mg total) by mouth daily.  Marland Kitchen lubiprostone (AMITIZA) 24 MCG capsule Take 1 capsule (24 mcg total) by mouth 2 (two) times daily with a meal.  . Melatonin 5 MG CAPS Take 5 mg by mouth at bedtime.  . metoprolol tartrate (LOPRESSOR) 25 MG tablet Take 1 tablet (25 mg total) by mouth 2 (two) times daily.  . Multiple Vitamin (MULTIVITAMIN WITH  MINERALS) TABS tablet Take 1 tablet by mouth daily.  Marland Kitchen omeprazole (PRILOSEC) 40 MG capsule Take 1 capsule (40 mg total) by mouth daily.  Marland Kitchen oxyCODONE (OXY IR/ROXICODONE) 5 MG immediate release tablet Take 1 tablet (5 mg total) by mouth every 6 (six) hours as needed for severe pain.  . potassium chloride SA (KLOR-CON) 20 MEQ tablet 2 in am and 1 at night.  . pravastatin (PRAVACHOL) 40 MG tablet Take 1 tablet (40 mg total) by mouth at bedtime.  Marland Kitchen PRESCRIPTION MEDICATION Apply 1 application topically 4 (four) times daily as needed (hemorrhoid). Nifedipine 5% ointment  Rx is sent to [facility]. Last called in on 07/19/2020 with 1 yr of refills.  . valsartan (DIOVAN) 320 MG tablet TAKE 1 TABLET BY MOUTH  DAILY   No facility-administered encounter medications on file as of 09/13/2020.   Megan Galvan CPP, asked me to complete a Januvia form for PAP.  I have completed the form and submitted to Megan Galvan, CPP for signatures and approval.  Megan Galvan, CPP had spoken to patient earlier to come by and sign form and bring proof of income.  [name], [facility] Pharmacist Assistant [phone]

## 2020-09-14 ENCOUNTER — Ambulatory Visit (INDEPENDENT_AMBULATORY_CARE_PROVIDER_SITE_OTHER): Payer: Medicare Other | Admitting: Cardiology

## 2020-09-14 ENCOUNTER — Other Ambulatory Visit: Payer: Self-pay

## 2020-09-14 ENCOUNTER — Encounter: Payer: Self-pay | Admitting: Cardiology

## 2020-09-14 ENCOUNTER — Other Ambulatory Visit: Payer: Self-pay | Admitting: Family Medicine

## 2020-09-14 VITALS — BP 142/72 | HR 51 | Ht 66.0 in | Wt 206.6 lb

## 2020-09-14 DIAGNOSIS — I480 Paroxysmal atrial fibrillation: Secondary | ICD-10-CM

## 2020-09-14 DIAGNOSIS — Z7901 Long term (current) use of anticoagulants: Secondary | ICD-10-CM

## 2020-09-14 DIAGNOSIS — Z953 Presence of xenogenic heart valve: Secondary | ICD-10-CM | POA: Diagnosis not present

## 2020-09-14 DIAGNOSIS — Z79899 Other long term (current) drug therapy: Secondary | ICD-10-CM | POA: Diagnosis not present

## 2020-09-14 DIAGNOSIS — I10 Essential (primary) hypertension: Secondary | ICD-10-CM | POA: Diagnosis not present

## 2020-09-14 MED ORDER — ASPIRIN EC 81 MG PO TBEC: 81.0000 mg | DELAYED_RELEASE_TABLET | Freq: Every day | ORAL | 3 refills | Status: DC

## 2020-09-14 NOTE — Progress Notes (Signed)
Cardiology Office Note:    Date:  09/14/2020   ID:  Megan Galvan, DOB December 26, 1950, MRN [id number]  PCP:  Rochel Brome, MD  Cardiologist:  Shirlee More, MD    Referring MD: Rochel Brome, MD    ASSESSMENT:    1. Paroxysmal atrial fibrillation (HCC)   2. On amiodarone therapy   3. Chronic anticoagulation   4. S/P aortic valve replacement with bioprosthetic valve   5. Essential hypertension    PLAN:    In order of problems listed above:  1. She is improved atrial fibrillation brief postoperative keep her on low-dose amiodarone at her next visit stop anticoagulation stop beta-blocker and transition to low-dose aspirin. 2. She is making slow steady gradual recovery from her open heart surgery encouraged her to enroll in cardiac rehabilitation 3. Stable BP at target continue current treatment diuretic calcium channel blocker ARB 4. Continue statin after aortic valve replacement   Next appointment: 6 weeks   Medication Adjustments/Labs and Tests Ordered: Current medicines are reviewed at length with the patient today.  Concerns regarding medicines are outlined above.  No orders of the defined types were placed in this encounter.  No orders of the defined types were placed in this encounter.   Chief Complaint  Patient presents with  . Follow-up  . Atrial Fibrillation    History of Present Illness:    Megan Galvan is a 70 y.o. female with a hx of critical symptomatic aortic stenosis with surgical AVR 78/29/5621 complicated by postoperative atrial fibrillation treated with amiodarone last seen by me 08/17/2020 with recurrent breakthrough atrial fibrillation on amiodarone. Compliance with diet, lifestyle and medications: Yes  After last visit atrial fibrillation converted in about 1 day.  No recurrence slowly steadily improving still has sternal pain but not severe is walking every day and I encouraged her to get to cardiac rehabilitation.  No edema shortness of breath  palpitation or syncope.  She remains anticoagulated.  Today transition to aspirin I think she can stop her beta-blocker and next visit I will plan to discontinue amiodarone Past Medical History:  Diagnosis Date  . Allergy   . Anemia   . Arthritis   . Bone spur of acromioclavicular joint, left   . Cataract   . Diabetes mellitus without complication ([place])   . GERD (gastroesophageal reflux disease)   . Headache   . Heart murmur   . Hyperlipidemia   . Hypertension   . Nonrheumatic aortic (valve) stenosis   . Plantar fasciitis   . S/P aortic valve replacement with bioprosthetic valve 08/10/2020   21 mm Edwards Resilia Inspiria Bioprosthetic Tissue Valve  SN [id number] Model 11500A  . Sleep apnea    cpap    Past Surgical History:  Procedure Laterality Date  . ANTERIOR CERVICAL DECOMP/DISCECTOMY FUSION     x 2  . AORTIC VALVE REPLACEMENT N/A 08/10/2020   Procedure: AORTIC VALVE REPLACEMENT (AVR) USING INSPIRIS VALVE SIZE 21MM;  Surgeon: Rexene Alberts, MD;  Location: [place];  Service: Open Heart Surgery;  Laterality: N/A;  . BACK SURGERY  11/17/2016   L3-L5  . BILATERAL CARPAL TUNNEL RELEASE    . bone spur Left    left shoulder  . bone spur Right    Right shoulder  . COLONOSCOPY  08/22/2011   Moderate predominantly sigmoid diverticulosis. Small internal hemorrhoids.   . COLONOSCOPY    . HEMORROIDECTOMY  02/2019  . NECK SURGERY    . RIGHT/LEFT HEART CATH AND CORONARY ANGIOGRAPHY N/A  07/27/2020   Procedure: RIGHT/LEFT HEART CATH AND CORONARY ANGIOGRAPHY;  Surgeon: Sherren Mocha, MD;  Location: [facility];  Service: Cardiovascular;  Laterality: N/A;  . TEE WITHOUT CARDIOVERSION N/A 08/10/2020   Procedure: TRANSESOPHAGEAL ECHOCARDIOGRAM (TEE);  Surgeon: Rexene Alberts, MD;  Location: [facility];  Service: Open Heart Surgery;  Laterality: N/A;  . torn rotator cuff Right    Right shoulder  . torn rotator cuff Left    left shoulder  . TRIGGER FINGER RELEASE Left    thumb  .  TRIGGER FINGER RELEASE Right    thumb and middle finger  . UPPER GASTROINTESTINAL ENDOSCOPY    . WRIST SURGERY Left    torn ligament  . WRIST SURGERY Right    cyst    Current Medications: Current Meds  Medication Sig  . acetaminophen (TYLENOL) 500 MG tablet Take 1,000 mg by mouth every 6 (six) hours as needed for moderate pain or headache.  Marland Kitchen amiodarone (PACERONE) 100 MG tablet Take 100 mg by mouth daily.  Marland Kitchen apixaban (ELIQUIS) 5 MG TABS tablet Take 1 tablet (5 mg total) by mouth 2 (two) times daily.  . Calcium Carb-Cholecalciferol (CALCIUM 600 + D PO) Take 2 tablets by mouth daily.  . cetirizine (ZYRTEC) 10 MG tablet Take 10 mg by mouth daily.  . chlorthalidone (HYGROTON) 50 MG tablet TAKE 1 TABLET BY MOUTH  DAILY  . conjugated estrogens (PREMARIN) vaginal cream Place 1 Applicatorful vaginally 2 (two) times a week.  . diltiazem (CARDIZEM) 30 MG tablet Take 1 tablet (30 mg total) by mouth every 6 (six) hours as needed (If your heart rate is greater than 100.).  Marland Kitchen famotidine (PEPCID) 40 MG tablet TAKE 1 TABLET BY MOUTH AT  BEDTIME  . fluticasone (FLONASE) 50 MCG/ACT nasal spray Place 1 spray into the nose daily as needed for allergies.  . hydrocortisone (ANUSOL-HC) 25 MG suppository Place 1 suppository (25 mg total) rectally 2 (two) times daily.  Marland Kitchen linagliptin (TRADJENTA) 5 MG TABS tablet Take 1 tablet (5 mg total) by mouth daily.  Marland Kitchen lubiprostone (AMITIZA) 24 MCG capsule Take 1 capsule (24 mcg total) by mouth 2 (two) times daily with a meal.  . Melatonin 5 MG CAPS Take 5 mg by mouth at bedtime.  . metoprolol tartrate (LOPRESSOR) 25 MG tablet Take 1 tablet (25 mg total) by mouth 2 (two) times daily.  . Multiple Vitamin (MULTIVITAMIN WITH MINERALS) TABS tablet Take 1 tablet by mouth daily.  Marland Kitchen omeprazole (PRILOSEC) 40 MG capsule Take 1 capsule (40 mg total) by mouth daily.  . potassium chloride SA (KLOR-CON) 20 MEQ tablet 2 in am and 1 at night.  . pravastatin (PRAVACHOL) 40 MG tablet Take 1  tablet (40 mg total) by mouth at bedtime.  Marland Kitchen PRESCRIPTION MEDICATION Apply 1 application topically 4 (four) times daily as needed (hemorrhoid). Nifedipine 5% ointment  Rx is sent to [facility]. Last called in on 07/19/2020 with 1 yr of refills.  . valsartan (DIOVAN) 320 MG tablet TAKE 1 TABLET BY MOUTH  DAILY     Allergies:   Ace inhibitors, Farxiga [dapagliflozin], Jardiance [empagliflozin], Keflex [cephalexin], Latex, Nexium [esomeprazole magnesium], Protonix [pantoprazole sodium], and Doxycycline   Social History   Socioeconomic History  . Marital status: Married    Spouse name: Not on file  . Number of children: 2  . Years of education: Not on file  . Highest education level: Not on file  Occupational History  . Not on file  Tobacco Use  .  Smoking status: Never Smoker  . Smokeless tobacco: Never Used  Vaping Use  . Vaping Use: Never used  Substance and Sexual Activity  . Alcohol use: Never  . Drug use: Never  . Sexual activity: Not on file  Other Topics Concern  . Not on file  Social History Narrative  . Not on file   Social Determinants of Health   Financial Resource Strain: Not on file  Food Insecurity: No Food Insecurity  . Worried About Charity fundraiser in the Last Year: Never true  . Ran Out of Food in the Last Year: Never true  Transportation Needs: No Transportation Needs  . Lack of Transportation (Medical): No  . Lack of Transportation (Non-Medical): No  Physical Activity: Not on file  Stress: Not on file  Social Connections: Not on file     Family History: The patient's family history includes Colon cancer in her paternal grandmother. There is no history of Esophageal cancer, Rectal cancer, or Stomach cancer. ROS:   Please see the history of present illness.    All other systems reviewed and are negative.  EKGs/Labs/Other Studies Reviewed:    The following studies were reviewed today:  EKG today shows sinus bradycardia 51 bpm  normal  Recent Labs: 08/11/2020: Magnesium 2.4 09/11/2020: ALT 15; BUN 12; Creatinine, Ser 0.94; Hemoglobin 11.1; Platelets 172; Potassium 4.0; Sodium 139  Recent Lipid Panel    Component Value Date/Time   CHOL 119 09/11/2020 1140   TRIG 91 09/11/2020 1140   HDL 44 09/11/2020 1140   CHOLHDL 2.7 09/11/2020 1140   LDLCALC 57 09/11/2020 1140    Physical Exam:    VS:  BP (!) 142/72   Pulse (!) 51   Ht 5\' 6"  (1.676 m)   Wt 206 lb 9.6 oz (93.7 kg)   SpO2 96%   BMI 33.35 kg/m     Wt Readings from Last 3 Encounters:  09/14/20 206 lb 9.6 oz (93.7 kg)  09/11/20 207 lb (93.9 kg)  09/11/20 203 lb 9.6 oz (92.4 kg)     GEN:  Well nourished, well developed in no acute distress HEENT: Normal NECK: No JVD; No carotid bruits LYMPHATICS: No lymphadenopathy CARDIAC: RRR, no murmurs, rubs, gallops RESPIRATORY:  Clear to auscultation without rales, wheezing or rhonchi  ABDOMEN: Soft, non-tender, non-distended MUSCULOSKELETAL:  No edema; No deformity  SKIN: Warm and dry NEUROLOGIC:  Alert and oriented x 3 PSYCHIATRIC:  Normal affect    Signed, Shirlee More, MD  09/14/2020 3:27 PM    [facility]

## 2020-09-14 NOTE — Patient Instructions (Signed)
Medication Instructions:  Your physician has recommended you make the following change in your medication:   Stop Eliquis Stop Metoprolol Start 81 mg coated aspirin daily.  *If you need a refill on your cardiac medications before your next appointment, please call your pharmacy*   Lab Work: None ordered If you have labs (blood work) drawn today and your tests are completely normal, you will receive your results only by: Marland Kitchen MyChart Message (if you have MyChart) OR . A paper copy in the mail If you have any lab test that is abnormal or we need to change your treatment, we will call you to review the results.   Testing/Procedures: None ordered   Follow-Up: At [facility], you and your health needs are our priority.  As part of our continuing mission to provide you with exceptional heart care, we have created designated Provider Care Teams.  These Care Teams include your primary Cardiologist (physician) and Advanced Practice Providers (APPs -  Physician Assistants and Nurse Practitioners) who all work together to provide you with the care you need, when you need it.  We recommend signing up for the patient portal called "MyChart".  Sign up information is provided on this After Visit Summary.  MyChart is used to connect with patients for Virtual Visits (Telemedicine).  Patients are able to view lab/test results, encounter notes, upcoming appointments, etc.  Non-urgent messages can be sent to your provider as well.   To learn more about what you can do with MyChart, go to [email or web address].    Your next appointment:   6 week(s)  The format for your next appointment:   In Person  Provider:   Shirlee More, MD   Other Instructions NA

## 2020-09-18 ENCOUNTER — Other Ambulatory Visit: Payer: Self-pay | Admitting: Family Medicine

## 2020-09-18 DIAGNOSIS — E782 Mixed hyperlipidemia: Secondary | ICD-10-CM

## 2020-09-19 ENCOUNTER — Other Ambulatory Visit: Payer: Self-pay

## 2020-09-19 MED ORDER — ACCU-CHEK GUIDE ME W/DEVICE KIT: PACK | 0 refills | Status: AC

## 2020-09-22 ENCOUNTER — Other Ambulatory Visit: Payer: Self-pay | Admitting: Physician Assistant

## 2020-09-23 ENCOUNTER — Other Ambulatory Visit: Payer: Self-pay | Admitting: Cardiology

## 2020-09-26 NOTE — Telephone Encounter (Signed)
I don't see eliquis on med llist will route to pharmd pool. 2f, 93.7kg, scr 0.94 09/11/20, lovw/munley 09/14/20

## 2020-09-27 ENCOUNTER — Telehealth: Payer: Self-pay

## 2020-09-27 NOTE — Telephone Encounter (Signed)
Eliquis stopped 5/19 office visit with Dr Bettina Gavia, will deny refill.

## 2020-09-27 NOTE — Progress Notes (Signed)
    Chronic Care Management Pharmacy Assistant   Name: GETSEMANI LINDON  MRN: [id number] DOB: 1950/10/08   Reason for Encounter: Patient left a message stating she had questions    Medications: Outpatient Encounter Medications as of 09/27/2020  Medication Sig  . acetaminophen (TYLENOL) 500 MG tablet Take 1,000 mg by mouth every 6 (six) hours as needed for moderate pain or headache.  Marland Kitchen amiodarone (PACERONE) 100 MG tablet Take 100 mg by mouth daily.  Marland Kitchen aspirin EC 81 MG tablet Take 1 tablet (81 mg total) by mouth daily. Swallow whole.  . Blood Glucose Monitoring Suppl (ACCU-CHEK GUIDE ME) w/Device KIT Use as directed  . Calcium Carb-Cholecalciferol (CALCIUM 600 + D PO) Take 2 tablets by mouth daily.  . cetirizine (ZYRTEC) 10 MG tablet Take 10 mg by mouth daily.  . chlorthalidone (HYGROTON) 50 MG tablet TAKE 1 TABLET BY MOUTH  DAILY  . conjugated estrogens (PREMARIN) vaginal cream Place 1 Applicatorful vaginally 2 (two) times a week.  . diltiazem (CARDIZEM) 30 MG tablet Take 1 tablet (30 mg total) by mouth every 6 (six) hours as needed (If your heart rate is greater than 100.).  Marland Kitchen famotidine (PEPCID) 40 MG tablet TAKE 1 TABLET BY MOUTH AT  BEDTIME  . fluticasone (FLONASE) 50 MCG/ACT nasal spray Place 1 spray into the nose daily as needed for allergies.  . hydrocortisone (ANUSOL-HC) 25 MG suppository Place 1 suppository (25 mg total) rectally 2 (two) times daily.  Marland Kitchen linagliptin (TRADJENTA) 5 MG TABS tablet Take 1 tablet (5 mg total) by mouth daily.  Marland Kitchen lubiprostone (AMITIZA) 24 MCG capsule Take 1 capsule (24 mcg total) by mouth 2 (two) times daily with a meal.  . Melatonin 5 MG CAPS Take 5 mg by mouth at bedtime.  . Multiple Vitamin (MULTIVITAMIN WITH MINERALS) TABS tablet Take 1 tablet by mouth daily.  Marland Kitchen omeprazole (PRILOSEC) 40 MG capsule Take 1 capsule (40 mg total) by mouth daily.  . potassium chloride SA (KLOR-CON) 20 MEQ tablet TAKE 2 TABLETS BY MOUTH IN  THE MORNING AND 1 TABLET IN THE  EVENING  . pravastatin (PRAVACHOL) 40 MG tablet TAKE 1 TABLET BY MOUTH AT  BEDTIME  . PRESCRIPTION MEDICATION Apply 1 application topically 4 (four) times daily as needed (hemorrhoid). Nifedipine 5% ointment  Rx is sent to [facility]. Last called in on 07/19/2020 with 1 yr of refills.  . valsartan (DIOVAN) 320 MG tablet TAKE 1 TABLET BY MOUTH  DAILY   No facility-administered encounter medications on file as of 09/27/2020.    Patient stated she has appointment with Dr. [name] on 09/28/20, and she would sign her PAP forms and bring proof of income to complete forms for Januvia.  Patient mentioned she was on Tradjenta currently.  I told her the assistance was for Januvia, that there was a change.  She said she would confirm with Dr. [name].   [name], [place] Pharmacist Assistant [phone]

## 2020-09-28 ENCOUNTER — Encounter: Payer: Self-pay | Admitting: Family Medicine

## 2020-09-28 ENCOUNTER — Ambulatory Visit (INDEPENDENT_AMBULATORY_CARE_PROVIDER_SITE_OTHER): Payer: Medicare Other | Admitting: Family Medicine

## 2020-09-28 ENCOUNTER — Telehealth: Payer: Self-pay

## 2020-09-28 ENCOUNTER — Other Ambulatory Visit: Payer: Self-pay

## 2020-09-28 VITALS — BP 138/72 | HR 70 | Temp 97.3°F | Resp 16 | Ht 66.0 in | Wt 205.0 lb

## 2020-09-28 DIAGNOSIS — R42 Dizziness and giddiness: Secondary | ICD-10-CM

## 2020-09-28 DIAGNOSIS — I951 Orthostatic hypotension: Secondary | ICD-10-CM

## 2020-09-28 MED ORDER — ACCU-CHEK GUIDE VI STRP: ORAL_STRIP | 3 refills | Status: DC

## 2020-09-28 MED ORDER — ACCU-CHEK GUIDE CONTROL VI LIQD: 1.0000 | 0 refills | Status: DC

## 2020-09-28 NOTE — Progress Notes (Signed)
Acute Office Visit  Subjective:    Patient ID: Megan Galvan, female    DOB: [birth year], 70 y.o.   MRN: [id number]  Chief Complaint  Patient presents with   balance issues   Nausea    HPI Patient is in today for dizziness, balance issues and nausea. No vomiting. It is positional. She is eating and drinking okay. HTN meds include cardizem, chlorthalidone,   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   21 mm Edwards Resilia Inspiria Bioprosthetic Tissue Valve  SN [id number] Model 11500A   Sleep apnea    cpap    Past Surgical History:  Procedure Laterality Date   ANTERIOR CERVICAL DECOMP/DISCECTOMY FUSION     x 2   AORTIC VALVE REPLACEMENT N/A 08/10/2020   Procedure: AORTIC VALVE REPLACEMENT (AVR) USING INSPIRIS VALVE SIZE 21MM;  Surgeon: Rexene Alberts, MD;  Location: [place];  Service: Open Heart Surgery;  Laterality: N/A;   BACK SURGERY  11/17/2016   L3-L5   BILATERAL CARPAL TUNNEL RELEASE     bone spur Left    left shoulder   bone spur Right    Right shoulder   COLONOSCOPY  08/22/2011   Moderate predominantly sigmoid diverticulosis. Small internal hemorrhoids.    COLONOSCOPY     HEMORROIDECTOMY  02/2019   NECK SURGERY     RIGHT/LEFT HEART CATH AND CORONARY ANGIOGRAPHY N/A 07/27/2020   Procedure: RIGHT/LEFT HEART CATH AND CORONARY ANGIOGRAPHY;  Surgeon: Sherren Mocha, MD;  Location: [place] [facility];  Service: Cardiovascular;  Laterality: N/A;   TEE WITHOUT CARDIOVERSION N/A 08/10/2020   Procedure: TRANSESOPHAGEAL ECHOCARDIOGRAM (TEE);  Surgeon: Rexene Alberts, MD;  Location: [place];  Service: Open  Heart Surgery;  Laterality: N/A;   torn rotator cuff Right    Right shoulder   torn rotator cuff Left    left shoulder   TRIGGER FINGER RELEASE Left    thumb   TRIGGER FINGER RELEASE Right    thumb and middle finger   UPPER GASTROINTESTINAL ENDOSCOPY     WRIST SURGERY Left    torn ligament   WRIST SURGERY Right    cyst    Family History  Problem Relation Age of Onset   Colon cancer Paternal Grandmother    Esophageal cancer Neg Hx    Rectal cancer Neg Hx    Stomach cancer Neg Hx     Social History   Socioeconomic History   Marital status: Married    Spouse name: Not on file   Number of children: 2   Years of education: Not on file   Highest education level: Not on file  Occupational History   Not on file  Tobacco Use   Smoking status: Never   Smokeless tobacco: Never  Vaping Use   Vaping Use: Never used  Substance and Sexual Activity   Alcohol use: Never   Drug use: Never   Sexual activity: Not on file  Other Topics Concern   Not on file  Social History Narrative   Not on file  Social Determinants of Health   Financial Resource Strain: Not on file  Food Insecurity: Not on file  Transportation Needs: Not on file  Physical Activity: Not on file  Stress: Not on file  Social Connections: Not on file  Intimate Partner Violence: Not on file    Outpatient Medications Prior to Visit  Medication Sig Dispense Refill   acetaminophen (TYLENOL) 500 MG tablet Take 1,000 mg by mouth every 6 (six) hours as needed for moderate pain or headache.     amiodarone (PACERONE) 100 MG tablet Take 100 mg by mouth daily.     aspirin EC 81 MG tablet Take 1 tablet (81 mg total) by mouth daily. Swallow whole. 90 tablet 3   Blood Glucose Monitoring Suppl (ACCU-CHEK GUIDE ME) w/Device KIT Use as directed 1 kit 0   Calcium Carb-Cholecalciferol (CALCIUM 600 + D PO) Take 2 tablets by mouth daily.     cetirizine (ZYRTEC) 10 MG tablet Take 10 mg by mouth daily.     chlorthalidone  (HYGROTON) 50 MG tablet TAKE 1 TABLET BY MOUTH  DAILY 90 tablet 3   conjugated estrogens (PREMARIN) vaginal cream Place 1 Applicatorful vaginally 2 (two) times a week. 42.5 g 2   diltiazem (CARDIZEM) 30 MG tablet Take 1 tablet (30 mg total) by mouth every 6 (six) hours as needed (If your heart rate is greater than 100.). 30 tablet 3   famotidine (PEPCID) 40 MG tablet TAKE 1 TABLET BY MOUTH AT  BEDTIME 90 tablet 3   fluticasone (FLONASE) 50 MCG/ACT nasal spray Place 1 spray into the nose daily as needed for allergies.     hydrocortisone (ANUSOL-HC) 25 MG suppository Place 1 suppository (25 mg total) rectally 2 (two) times daily. 30 suppository 2   linagliptin (TRADJENTA) 5 MG TABS tablet Take 1 tablet (5 mg total) by mouth daily. 90 tablet 0   lubiprostone (AMITIZA) 24 MCG capsule Take 1 capsule (24 mcg total) by mouth 2 (two) times daily with a meal. 60 capsule 2   Melatonin 5 MG CAPS Take 5 mg by mouth at bedtime.     Multiple Vitamin (MULTIVITAMIN WITH MINERALS) TABS tablet Take 1 tablet by mouth daily.     omeprazole (PRILOSEC) 40 MG capsule Take 1 capsule (40 mg total) by mouth daily. 90 capsule 1   potassium chloride SA (KLOR-CON) 20 MEQ tablet TAKE 2 TABLETS BY MOUTH IN  THE MORNING AND 1 TABLET IN THE EVENING 270 tablet 3   pravastatin (PRAVACHOL) 40 MG tablet TAKE 1 TABLET BY MOUTH AT  BEDTIME 90 tablet 3   PRESCRIPTION MEDICATION Apply 1 application topically 4 (four) times daily as needed (hemorrhoid). Nifedipine 5% ointment  Rx is sent to [place]. Last called in on 07/19/2020 with 1 yr of refills.     valsartan (DIOVAN) 320 MG tablet TAKE 1 TABLET BY MOUTH  DAILY 90 tablet 3   No facility-administered medications prior to visit.    Allergies  Allergen Reactions   Ace Inhibitors Cough   Farxiga [Dapagliflozin]     Yeast infections    Jardiance [Empagliflozin]     Yeast infections   Keflex [Cephalexin] Itching   Latex     Burn and itch   Nexium [Esomeprazole  Magnesium] Diarrhea   Protonix [Pantoprazole Sodium] Other (See Comments)    Constipation   Doxycycline Rash    Review of Systems  Constitutional:  Negative for chills, fatigue and fever.  HENT:  Negative for congestion, ear pain, rhinorrhea and  sore throat.   Respiratory:  Negative for cough and shortness of breath.   Cardiovascular:  Negative for chest pain and palpitations.  Gastrointestinal:  Positive for constipation and nausea. Negative for abdominal pain, diarrhea and vomiting.  Genitourinary:  Negative for dysuria and urgency.  Musculoskeletal:  Positive for back pain (Since operation has improved. Leg pain resolved, but still achy in back. ). Negative for myalgias.  Neurological:  Positive for dizziness, light-headedness and headaches. Negative for weakness.  Psychiatric/Behavioral:  Negative for dysphoric mood. The patient is not nervous/anxious.       Objective:    Physical Exam Vitals reviewed.  Constitutional:      Appearance: Normal appearance.  Cardiovascular:     Rate and Rhythm: Normal rate and regular rhythm.     Heart sounds: Murmur heard.  Pulmonary:     Effort: Pulmonary effort is normal.     Breath sounds: Normal breath sounds.  Neurological:     Mental Status: She is alert.     Coordination: Coordination normal.     Comments: Negative hall pike. No nystagmus.   Psychiatric:        Mood and Affect: Mood normal.        Behavior: Behavior normal.    Temp (!) 97.3 F (36.3 C)   Resp 16   Ht _0  (1.676 m)   Wt 205 lb (93 kg)   BMI 33.09 kg/m  Wt Readings from Last 3 Encounters:  09/28/20 205 lb (93 kg)  09/14/20 206 lb 9.6 oz (93.7 kg)  09/11/20 207 lb (93.9 kg)   Orthostatics: Laying 150/70, pulse 72 Sitting 144/70, pulse 70 Standing 138/72, pulse 70  Health Maintenance Due  Topic Date Due   Hepatitis C Screening  Never done   Zoster Vaccines- Shingrix (1 of 2) Never done   PNA vac Low Risk Adult (2 of 2 - PPSV23) 04/13/2017   COVID-19  Vaccine (4 - Booster for Moderna series) 05/31/2020    There are no preventive care reminders to display for this patient.   Lab Results  Component Value Date   TSH 1.690 09/28/2020   Lab Results  Component Value Date   WBC 6.2 09/28/2020   HGB 11.5 09/28/2020   HCT 35.1 09/28/2020   MCV 95 09/28/2020   PLT 233 09/28/2020   Lab Results  Component Value Date   NA 141 09/28/2020   K 4.5 09/28/2020   CO2 27 09/28/2020   GLUCOSE 105 (H) 09/28/2020   BUN 16 09/28/2020   CREATININE 0.96 09/28/2020   BILITOT 0.3 09/28/2020   ALKPHOS 70 09/28/2020   AST 28 09/28/2020   ALT 19 09/28/2020   PROT 7.2 09/28/2020   ALBUMIN 4.5 09/28/2020   CALCIUM 9.6 09/28/2020   ANIONGAP 10 08/13/2020   EGFR 64 09/28/2020   Lab Results  Component Value Date   CHOL 119 09/11/2020   Lab Results  Component Value Date   HDL 44 09/11/2020   Lab Results  Component Value Date   LDLCALC 57 09/11/2020   Lab Results  Component Value Date   TRIG 91 09/11/2020   Lab Results  Component Value Date   CHOLHDL 2.7 09/11/2020   Lab Results  Component Value Date   HGBA1C 6.5 (H) 08/08/2020       Assessment & Plan:  1. Orthostatic hypotension Although pt is orthostatic, her bp is also too high.  Recommend hydration.  - CBC with Differential/Platelet - Comprehensive metabolic panel - TSH  2. Dizziness -  glucose blood (ACCU-CHEK GUIDE) test strip; Use as instructed  Dispense: 100 each; Refill: 3   Meds ordered this encounter  Medications   glucose blood (ACCU-CHEK GUIDE) test strip    Sig: Use as instructed    Dispense:  100 each    Refill:  3   DISCONTD: Blood Glucose Calibration (ACCU-CHEK GUIDE CONTROL) LIQD    Sig: 1 each by In Vitro route every 30 (thirty) days.    Dispense:  1 each    Refill:  0    Orders Placed This Encounter  Procedures   CBC with Differential/Platelet   Comprehensive metabolic panel   TSH    Follow-up:with cardiology 6/28.   An After Visit Summary  was printed and given to the patient.  Rochel Brome, MD [facility] [phone]

## 2020-09-29 LAB — CBC WITH DIFFERENTIAL/PLATELET
Basophils Absolute: 0.1 10*3/uL (ref 0.0–0.2)
Basos: 1 %
EOS (ABSOLUTE): 0.2 10*3/uL (ref 0.0–0.4)
Eos: 3 %
Hematocrit: 35.1 % (ref 34.0–46.6)
Hemoglobin: 11.5 g/dL (ref 11.1–15.9)
Immature Grans (Abs): 0 10*3/uL (ref 0.0–0.1)
Immature Granulocytes: 0 %
Lymphocytes Absolute: 1.9 10*3/uL (ref 0.7–3.1)
Lymphs: 30 %
MCH: 31.2 pg (ref 26.6–33.0)
MCHC: 32.8 g/dL (ref 31.5–35.7)
MCV: 95 fL (ref 79–97)
Monocytes Absolute: 0.8 10*3/uL (ref 0.1–0.9)
Monocytes: 13 %
Neutrophils Absolute: 3.3 10*3/uL (ref 1.4–7.0)
Neutrophils: 53 %
Platelets: 233 10*3/uL (ref 150–450)
RBC: 3.69 x10E6/uL — ABNORMAL LOW (ref 3.77–5.28)
RDW: 13.8 % (ref 11.7–15.4)
WBC: 6.2 10*3/uL (ref 3.4–10.8)

## 2020-09-29 LAB — COMPREHENSIVE METABOLIC PANEL
ALT: 19 IU/L (ref 0–32)
AST: 28 IU/L (ref 0–40)
Albumin/Globulin Ratio: 1.7 (ref 1.2–2.2)
Albumin: 4.5 g/dL (ref 3.8–4.8)
Alkaline Phosphatase: 70 IU/L (ref 44–121)
BUN/Creatinine Ratio: 17 (ref 12–28)
BUN: 16 mg/dL (ref 8–27)
Bilirubin Total: 0.3 mg/dL (ref 0.0–1.2)
CO2: 27 mmol/L (ref 20–29)
Calcium: 9.6 mg/dL (ref 8.7–10.3)
Chloride: 99 mmol/L (ref 96–106)
Creatinine, Ser: 0.96 mg/dL (ref 0.57–1.00)
Globulin, Total: 2.7 g/dL (ref 1.5–4.5)
Glucose: 105 mg/dL — ABNORMAL HIGH (ref 65–99)
Potassium: 4.5 mmol/L (ref 3.5–5.2)
Sodium: 141 mmol/L (ref 134–144)
Total Protein: 7.2 g/dL (ref 6.0–8.5)
eGFR: 64 mL/min/{1.73_m2} (ref >59)

## 2020-09-29 LAB — TSH: TSH: 1.69 u[IU]/mL (ref 0.450–4.500)

## 2020-10-02 ENCOUNTER — Other Ambulatory Visit: Payer: Self-pay

## 2020-10-08 ENCOUNTER — Encounter: Payer: Self-pay | Admitting: Family Medicine

## 2020-10-12 ENCOUNTER — Telehealth: Payer: Self-pay

## 2020-10-12 NOTE — Telephone Encounter (Signed)
-----   Message from Rochel Brome, MD sent at 10/08/2020  9:27 PM EDT ----- Regarding: dizziness Please call and check on patient. She was orthostatic and very dizzy when I saw her last.  Her labs were okay.  If not getting better, I would recommend she either call Dr. Joya Gaskins office to get in a little sooner than 6/28 or schedule her with Gay Filler or Larene [facility].  kc

## 2020-10-12 NOTE — Telephone Encounter (Signed)
Follow-up call.  Patient reports that she is feeling much better.  Encouraged to recheck in the office if anything changes.

## 2020-10-16 NOTE — Telephone Encounter (Signed)
Opened phone encounter in error 

## 2020-10-24 ENCOUNTER — Encounter: Payer: Self-pay | Admitting: Cardiology

## 2020-10-24 ENCOUNTER — Ambulatory Visit (INDEPENDENT_AMBULATORY_CARE_PROVIDER_SITE_OTHER): Payer: Medicare Other | Admitting: Cardiology

## 2020-10-24 ENCOUNTER — Other Ambulatory Visit: Payer: Self-pay

## 2020-10-24 VITALS — BP 138/64 | HR 66 | Ht 66.0 in | Wt 210.2 lb

## 2020-10-24 DIAGNOSIS — Z953 Presence of xenogenic heart valve: Secondary | ICD-10-CM

## 2020-10-24 DIAGNOSIS — I10 Essential (primary) hypertension: Secondary | ICD-10-CM

## 2020-10-24 DIAGNOSIS — E782 Mixed hyperlipidemia: Secondary | ICD-10-CM

## 2020-10-24 DIAGNOSIS — I480 Paroxysmal atrial fibrillation: Secondary | ICD-10-CM | POA: Diagnosis not present

## 2020-10-24 DIAGNOSIS — Z79899 Other long term (current) drug therapy: Secondary | ICD-10-CM

## 2020-10-24 NOTE — Progress Notes (Signed)
Cardiology Office Note:    Date:  10/24/2020   ID:  Megan Galvan, DOB 1951/01/01, MRN [id number]  PCP:  Rochel Brome, MD  Cardiologist:  Shirlee More, MD    Referring MD: Rochel Brome, MD    ASSESSMENT:    1. S/P aortic valve replacement with bioprosthetic valve   2. Essential hypertension   3. Paroxysmal atrial fibrillation (HCC)   4. On amiodarone therapy   5. Mixed hyperlipidemia    PLAN:    In order of problems listed above:  She is improving from surgical aortic valve replacement refer to cardiac rehabilitation continue current medical therapy including aspirin statin antihypertensives discontinue amiodarone if she maintains sinus rhythm is no longer anticoagulated. BP at target continue current treatment including diuretic calcium channel blocker Continue her statin   Next appointment: 3 months   Medication Adjustments/Labs and Tests Ordered: Current medicines are reviewed at length with the patient today.  Concerns regarding medicines are outlined above.  Orders Placed This Encounter  Procedures   EKG 12-Lead   No orders of the defined types were placed in this encounter.   Chief Complaint  Patient presents with   Follow-up   Atrial Fibrillation   Anticoagulation    History of Present Illness:    Megan Galvan is a 70 y.o. female with a hx of severe symptomatic aortic stenosis with surgical AVR 50/35/4656 complicated by postoperative atrial fibrillation with amiodarone therapy anticoagulation and hypertension last seen 09/14/2020.  At that time the intent was in the next postoperative visit to stop amiodarone and transition from anticoagulation to antiplatelet treatment.. Compliance with diet, lifestyle and medications: Yes  She is improved but still has a relatively sedentary lifestyle and is having sternal pain. She is taking Tylenol over-the-counter with relief. She is no longer anticoagulated maintaining sinus rhythm and will stop amiodarone  today She request to be referred to cardiac rehabilitation at [facility]. No palpitations syncope chest pain suggesting angina shortness of breath or edema. 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   21 mm Edwards Resilia Inspiria Bioprosthetic Tissue Valve  SN [id number] Model 11500A   Sleep apnea    cpap    Past Surgical History:  Procedure Laterality Date   ANTERIOR CERVICAL DECOMP/DISCECTOMY FUSION     x 2   AORTIC VALVE REPLACEMENT N/A 08/10/2020   Procedure: AORTIC VALVE REPLACEMENT (AVR) USING INSPIRIS VALVE SIZE 21MM;  Surgeon: Rexene Alberts, MD;  Location: [place];  Service: Open Heart Surgery;  Laterality: N/A;   BACK SURGERY  11/17/2016   L3-L5   BILATERAL CARPAL TUNNEL RELEASE     bone spur Left    left shoulder   bone spur Right    Right shoulder   COLONOSCOPY  08/22/2011   Moderate predominantly sigmoid diverticulosis. Small internal hemorrhoids.    COLONOSCOPY     HEMORROIDECTOMY  02/2019   NECK SURGERY     RIGHT/LEFT HEART CATH AND CORONARY ANGIOGRAPHY N/A 07/27/2020   Procedure: RIGHT/LEFT HEART CATH AND CORONARY ANGIOGRAPHY;  Surgeon: Sherren Mocha,  MD;  Location: [place] [facility];  Service: Cardiovascular;  Laterality: N/A;   TEE WITHOUT CARDIOVERSION N/A 08/10/2020   Procedure: TRANSESOPHAGEAL ECHOCARDIOGRAM (TEE);  Surgeon: Rexene Alberts, MD;  Location: [facility];  Service: Open Heart Surgery;  Laterality: N/A;   torn rotator cuff Right    Right shoulder   torn rotator cuff Left    left shoulder   TRIGGER FINGER RELEASE Left    thumb   TRIGGER FINGER RELEASE Right    thumb and  middle finger   UPPER GASTROINTESTINAL ENDOSCOPY     WRIST SURGERY Left    torn ligament   WRIST SURGERY Right    cyst    Current Medications: Current Meds  Medication Sig   acetaminophen (TYLENOL) 500 MG tablet Take 1,000 mg by mouth every 6 (six) hours as needed for moderate pain or headache.   aspirin EC 81 MG tablet Take 1 tablet (81 mg total) by mouth daily. Swallow whole.   Blood Glucose Monitoring Suppl (ACCU-CHEK GUIDE ME) w/Device KIT Use as directed   Calcium Carb-Cholecalciferol (CALCIUM 600 + D PO) Take 2 tablets by mouth daily.   cetirizine (ZYRTEC) 10 MG tablet Take 10 mg by mouth daily.   chlorthalidone (HYGROTON) 50 MG tablet TAKE 1 TABLET BY MOUTH  DAILY   conjugated estrogens (PREMARIN) vaginal cream Place 1 Applicatorful vaginally 2 (two) times a week.   diltiazem (CARDIZEM) 30 MG tablet Take 1 tablet (30 mg total) by mouth every 6 (six) hours as needed (If your heart rate is greater than 100.).   famotidine (PEPCID) 40 MG tablet TAKE 1 TABLET BY MOUTH AT  BEDTIME   fluticasone (FLONASE) 50 MCG/ACT nasal spray Place 1 spray into the nose daily as needed for allergies.   glucose blood (ACCU-CHEK GUIDE) test strip Use as instructed   hydrocortisone (ANUSOL-HC) 25 MG suppository Place 1 suppository (25 mg total) rectally 2 (two) times daily.   linagliptin (TRADJENTA) 5 MG TABS tablet Take 1 tablet (5 mg total) by mouth daily.   lubiprostone (AMITIZA) 24 MCG capsule Take 1 capsule (24 mcg total) by mouth 2 (two) times daily with a meal.   Melatonin 5 MG CAPS Take 5 mg by mouth at bedtime.   Multiple Vitamin (MULTIVITAMIN WITH MINERALS) TABS tablet Take 1 tablet by mouth daily.   omeprazole (PRILOSEC) 40 MG capsule Take 1 capsule (40 mg total) by mouth daily.   potassium chloride SA (KLOR-CON) 20 MEQ tablet TAKE 2 TABLETS BY MOUTH IN  THE MORNING AND 1 TABLET IN THE EVENING   pravastatin (PRAVACHOL) 40 MG tablet TAKE 1 TABLET BY MOUTH AT  BEDTIME   PRESCRIPTION MEDICATION  Apply 1 application topically 4 (four) times daily as needed (hemorrhoid). Nifedipine 5% ointment  Rx is sent to [facility]. Last called in on 07/19/2020 with 1 yr of refills.   valsartan (DIOVAN) 320 MG tablet TAKE 1 TABLET BY MOUTH  DAILY   [DISCONTINUED] amiodarone (PACERONE) 100 MG tablet Take 100 mg by mouth daily.     Allergies:   Ace inhibitors, Farxiga [dapagliflozin], Jardiance [empagliflozin], Keflex [cephalexin], Latex, Nexium [esomeprazole magnesium], Protonix [pantoprazole sodium], and Doxycycline   Social History   Socioeconomic History   Marital status: Married    Spouse name: Not on file   Number of children: 2   Years of education: Not on file   Highest education level: Not on file  Occupational History   Not on file  Tobacco Use   Smoking status:  Never   Smokeless tobacco: Never  Vaping Use   Vaping Use: Never used  Substance and Sexual Activity   Alcohol use: Never   Drug use: Never   Sexual activity: Not on file  Other Topics Concern   Not on file  Social History Narrative   Not on file   Social Determinants of Health   Financial Resource Strain: Not on file  Food Insecurity: Not on file  Transportation Needs: Not on file  Physical Activity: Not on file  Stress: Not on file  Social Connections: Not on file     Family History: The patient's family history includes Colon cancer in her paternal grandmother. There is no history of Esophageal cancer, Rectal cancer, or Stomach cancer. ROS:   Please see the history of present illness.    All other systems reviewed and are negative.  EKGs/Labs/Other Studies Reviewed:    The following studies were reviewed today:  EKG:  EKG ordered today and personally reviewed.  The ekg ordered today demonstrates sinus rhythm poor R wave progression  Recent Labs: 08/11/2020: Magnesium 2.4 09/28/2020: ALT 19; BUN 16; Creatinine, Ser 0.96; Hemoglobin 11.5; Platelets 233; Potassium 4.5; Sodium 141; TSH 1.690   Recent Lipid Panel    Component Value Date/Time   CHOL 119 09/11/2020 1140   TRIG 91 09/11/2020 1140   HDL 44 09/11/2020 1140   CHOLHDL 2.7 09/11/2020 1140   LDLCALC 57 09/11/2020 1140    Physical Exam:    VS:  BP 138/64 (BP Location: Right Arm, Patient Position: Sitting, Cuff Size: Large)   Pulse 66   Ht '5\' 6"'  (1.676 m)   Wt 210 lb 3.2 oz (95.3 kg)   SpO2 97%   BMI 33.93 kg/m     Wt Readings from Last 3 Encounters:  10/24/20 210 lb 3.2 oz (95.3 kg)  09/28/20 205 lb (93 kg)  09/14/20 206 lb 9.6 oz (93.7 kg)     GEN:  Well nourished, well developed in no acute distress HEENT: Normal NECK: No JVD; No carotid bruits LYMPHATICS: No lymphadenopathy CARDIAC: RRR, no murmurs, rubs, gallops RESPIRATORY:  Clear to auscultation without rales, wheezing or rhonchi  ABDOMEN: Soft, non-tender, non-distended MUSCULOSKELETAL:  No edema; No deformity  SKIN: Warm and dry NEUROLOGIC:  Alert and oriented x 3 PSYCHIATRIC:  Normal affect    Signed, Shirlee More, MD  10/24/2020 10:09 AM    [place]

## 2020-10-24 NOTE — Patient Instructions (Signed)
Medication Instructions:  Your physician has recommended you make the following change in your medication:  STOP: Amiodarone  *If you need a refill on your cardiac medications before your next appointment, please call your pharmacy*   Lab Work: None If you have labs (blood work) drawn today and your tests are completely normal, you will receive your results only by: [place] (if you have MyChart) OR A paper copy in the mail If you have any lab test that is abnormal or we need to change your treatment, we will call you to review the results.   Testing/Procedures: None   Follow-Up: At [facility], you and your health needs are our priority.  As part of our continuing mission to provide you with exceptional heart care, we have created designated Provider Care Teams.  These Care Teams include your primary Cardiologist (physician) and Advanced Practice Providers (APPs -  Physician Assistants and Nurse Practitioners) who all work together to provide you with the care you need, when you need it.  We recommend signing up for the patient portal called "MyChart".  Sign up information is provided on this After Visit Summary.  MyChart is used to connect with patients for Virtual Visits (Telemedicine).  Patients are able to view lab/test results, encounter notes, upcoming appointments, etc.  Non-urgent messages can be sent to your provider as well.   To learn more about what you can do with MyChart, go to [email or web address].    Your next appointment:   3 month(s)  The format for your next appointment:   In Person  Provider:   Shirlee More, MD   Other Instructions

## 2020-11-06 DIAGNOSIS — Z952 Presence of prosthetic heart valve: Secondary | ICD-10-CM | POA: Diagnosis not present

## 2020-11-06 DIAGNOSIS — K219 Gastro-esophageal reflux disease without esophagitis: Secondary | ICD-10-CM | POA: Diagnosis not present

## 2020-11-06 DIAGNOSIS — M6530 Trigger finger, unspecified finger: Secondary | ICD-10-CM | POA: Diagnosis not present

## 2020-11-06 DIAGNOSIS — D649 Anemia, unspecified: Secondary | ICD-10-CM | POA: Diagnosis not present

## 2020-11-06 DIAGNOSIS — E785 Hyperlipidemia, unspecified: Secondary | ICD-10-CM | POA: Diagnosis not present

## 2020-11-06 DIAGNOSIS — E119 Type 2 diabetes mellitus without complications: Secondary | ICD-10-CM | POA: Diagnosis not present

## 2020-11-06 DIAGNOSIS — I4891 Unspecified atrial fibrillation: Secondary | ICD-10-CM | POA: Diagnosis not present

## 2020-11-06 DIAGNOSIS — Z954 Presence of other heart-valve replacement: Secondary | ICD-10-CM | POA: Diagnosis not present

## 2020-11-06 DIAGNOSIS — R011 Cardiac murmur, unspecified: Secondary | ICD-10-CM | POA: Diagnosis not present

## 2020-11-06 DIAGNOSIS — I10 Essential (primary) hypertension: Secondary | ICD-10-CM | POA: Diagnosis not present

## 2020-11-06 DIAGNOSIS — G4730 Sleep apnea, unspecified: Secondary | ICD-10-CM | POA: Diagnosis not present

## 2020-11-08 DIAGNOSIS — Z952 Presence of prosthetic heart valve: Secondary | ICD-10-CM | POA: Diagnosis not present

## 2020-11-08 DIAGNOSIS — D649 Anemia, unspecified: Secondary | ICD-10-CM | POA: Diagnosis not present

## 2020-11-08 DIAGNOSIS — E119 Type 2 diabetes mellitus without complications: Secondary | ICD-10-CM | POA: Diagnosis not present

## 2020-11-08 DIAGNOSIS — E785 Hyperlipidemia, unspecified: Secondary | ICD-10-CM | POA: Diagnosis not present

## 2020-11-08 DIAGNOSIS — G4730 Sleep apnea, unspecified: Secondary | ICD-10-CM | POA: Diagnosis not present

## 2020-11-08 DIAGNOSIS — M6530 Trigger finger, unspecified finger: Secondary | ICD-10-CM | POA: Diagnosis not present

## 2020-11-08 DIAGNOSIS — I10 Essential (primary) hypertension: Secondary | ICD-10-CM | POA: Diagnosis not present

## 2020-11-08 DIAGNOSIS — Z954 Presence of other heart-valve replacement: Secondary | ICD-10-CM | POA: Diagnosis not present

## 2020-11-08 DIAGNOSIS — R011 Cardiac murmur, unspecified: Secondary | ICD-10-CM | POA: Diagnosis not present

## 2020-11-08 DIAGNOSIS — I4891 Unspecified atrial fibrillation: Secondary | ICD-10-CM | POA: Diagnosis not present

## 2020-11-08 DIAGNOSIS — K219 Gastro-esophageal reflux disease without esophagitis: Secondary | ICD-10-CM | POA: Diagnosis not present

## 2020-11-10 DIAGNOSIS — I4891 Unspecified atrial fibrillation: Secondary | ICD-10-CM | POA: Diagnosis not present

## 2020-11-10 DIAGNOSIS — E785 Hyperlipidemia, unspecified: Secondary | ICD-10-CM | POA: Diagnosis not present

## 2020-11-10 DIAGNOSIS — I10 Essential (primary) hypertension: Secondary | ICD-10-CM | POA: Diagnosis not present

## 2020-11-10 DIAGNOSIS — M6530 Trigger finger, unspecified finger: Secondary | ICD-10-CM | POA: Diagnosis not present

## 2020-11-10 DIAGNOSIS — Z952 Presence of prosthetic heart valve: Secondary | ICD-10-CM | POA: Diagnosis not present

## 2020-11-10 DIAGNOSIS — K219 Gastro-esophageal reflux disease without esophagitis: Secondary | ICD-10-CM | POA: Diagnosis not present

## 2020-11-10 DIAGNOSIS — E119 Type 2 diabetes mellitus without complications: Secondary | ICD-10-CM | POA: Diagnosis not present

## 2020-11-10 DIAGNOSIS — R011 Cardiac murmur, unspecified: Secondary | ICD-10-CM | POA: Diagnosis not present

## 2020-11-10 DIAGNOSIS — Z954 Presence of other heart-valve replacement: Secondary | ICD-10-CM | POA: Diagnosis not present

## 2020-11-10 DIAGNOSIS — D649 Anemia, unspecified: Secondary | ICD-10-CM | POA: Diagnosis not present

## 2020-11-10 DIAGNOSIS — G4730 Sleep apnea, unspecified: Secondary | ICD-10-CM | POA: Diagnosis not present

## 2020-11-12 ENCOUNTER — Other Ambulatory Visit: Payer: Self-pay | Admitting: Family Medicine

## 2020-11-13 DIAGNOSIS — E119 Type 2 diabetes mellitus without complications: Secondary | ICD-10-CM | POA: Diagnosis not present

## 2020-11-13 DIAGNOSIS — Z952 Presence of prosthetic heart valve: Secondary | ICD-10-CM | POA: Diagnosis not present

## 2020-11-13 DIAGNOSIS — K219 Gastro-esophageal reflux disease without esophagitis: Secondary | ICD-10-CM | POA: Diagnosis not present

## 2020-11-13 DIAGNOSIS — I10 Essential (primary) hypertension: Secondary | ICD-10-CM | POA: Diagnosis not present

## 2020-11-13 DIAGNOSIS — G4730 Sleep apnea, unspecified: Secondary | ICD-10-CM | POA: Diagnosis not present

## 2020-11-13 DIAGNOSIS — E785 Hyperlipidemia, unspecified: Secondary | ICD-10-CM | POA: Diagnosis not present

## 2020-11-13 DIAGNOSIS — D649 Anemia, unspecified: Secondary | ICD-10-CM | POA: Diagnosis not present

## 2020-11-13 DIAGNOSIS — Z954 Presence of other heart-valve replacement: Secondary | ICD-10-CM | POA: Diagnosis not present

## 2020-11-13 DIAGNOSIS — M6530 Trigger finger, unspecified finger: Secondary | ICD-10-CM | POA: Diagnosis not present

## 2020-11-13 DIAGNOSIS — R011 Cardiac murmur, unspecified: Secondary | ICD-10-CM | POA: Diagnosis not present

## 2020-11-13 DIAGNOSIS — I4891 Unspecified atrial fibrillation: Secondary | ICD-10-CM | POA: Diagnosis not present

## 2020-11-15 ENCOUNTER — Other Ambulatory Visit: Payer: Self-pay | Admitting: Physician Assistant

## 2020-11-17 DIAGNOSIS — K219 Gastro-esophageal reflux disease without esophagitis: Secondary | ICD-10-CM | POA: Diagnosis not present

## 2020-11-17 DIAGNOSIS — I4891 Unspecified atrial fibrillation: Secondary | ICD-10-CM | POA: Diagnosis not present

## 2020-11-17 DIAGNOSIS — E119 Type 2 diabetes mellitus without complications: Secondary | ICD-10-CM | POA: Diagnosis not present

## 2020-11-17 DIAGNOSIS — G4733 Obstructive sleep apnea (adult) (pediatric): Secondary | ICD-10-CM | POA: Diagnosis not present

## 2020-11-17 DIAGNOSIS — R011 Cardiac murmur, unspecified: Secondary | ICD-10-CM | POA: Diagnosis not present

## 2020-11-17 DIAGNOSIS — M6530 Trigger finger, unspecified finger: Secondary | ICD-10-CM | POA: Diagnosis not present

## 2020-11-17 DIAGNOSIS — E785 Hyperlipidemia, unspecified: Secondary | ICD-10-CM | POA: Diagnosis not present

## 2020-11-17 DIAGNOSIS — Z952 Presence of prosthetic heart valve: Secondary | ICD-10-CM | POA: Diagnosis not present

## 2020-11-17 DIAGNOSIS — I10 Essential (primary) hypertension: Secondary | ICD-10-CM | POA: Diagnosis not present

## 2020-11-17 DIAGNOSIS — D649 Anemia, unspecified: Secondary | ICD-10-CM | POA: Diagnosis not present

## 2020-11-17 DIAGNOSIS — G4730 Sleep apnea, unspecified: Secondary | ICD-10-CM | POA: Diagnosis not present

## 2020-11-17 DIAGNOSIS — Z954 Presence of other heart-valve replacement: Secondary | ICD-10-CM | POA: Diagnosis not present

## 2020-11-20 DIAGNOSIS — D649 Anemia, unspecified: Secondary | ICD-10-CM | POA: Diagnosis not present

## 2020-11-20 DIAGNOSIS — M6530 Trigger finger, unspecified finger: Secondary | ICD-10-CM | POA: Diagnosis not present

## 2020-11-20 DIAGNOSIS — R011 Cardiac murmur, unspecified: Secondary | ICD-10-CM | POA: Diagnosis not present

## 2020-11-20 DIAGNOSIS — K219 Gastro-esophageal reflux disease without esophagitis: Secondary | ICD-10-CM | POA: Diagnosis not present

## 2020-11-20 DIAGNOSIS — E119 Type 2 diabetes mellitus without complications: Secondary | ICD-10-CM | POA: Diagnosis not present

## 2020-11-20 DIAGNOSIS — I4891 Unspecified atrial fibrillation: Secondary | ICD-10-CM | POA: Diagnosis not present

## 2020-11-20 DIAGNOSIS — E785 Hyperlipidemia, unspecified: Secondary | ICD-10-CM | POA: Diagnosis not present

## 2020-11-20 DIAGNOSIS — G4730 Sleep apnea, unspecified: Secondary | ICD-10-CM | POA: Diagnosis not present

## 2020-11-20 DIAGNOSIS — Z952 Presence of prosthetic heart valve: Secondary | ICD-10-CM | POA: Diagnosis not present

## 2020-11-20 DIAGNOSIS — Z954 Presence of other heart-valve replacement: Secondary | ICD-10-CM | POA: Diagnosis not present

## 2020-11-20 DIAGNOSIS — I10 Essential (primary) hypertension: Secondary | ICD-10-CM | POA: Diagnosis not present

## 2020-11-22 DIAGNOSIS — E785 Hyperlipidemia, unspecified: Secondary | ICD-10-CM | POA: Diagnosis not present

## 2020-11-22 DIAGNOSIS — G4730 Sleep apnea, unspecified: Secondary | ICD-10-CM | POA: Diagnosis not present

## 2020-11-22 DIAGNOSIS — D649 Anemia, unspecified: Secondary | ICD-10-CM | POA: Diagnosis not present

## 2020-11-22 DIAGNOSIS — M6530 Trigger finger, unspecified finger: Secondary | ICD-10-CM | POA: Diagnosis not present

## 2020-11-22 DIAGNOSIS — E119 Type 2 diabetes mellitus without complications: Secondary | ICD-10-CM | POA: Diagnosis not present

## 2020-11-22 DIAGNOSIS — R011 Cardiac murmur, unspecified: Secondary | ICD-10-CM | POA: Diagnosis not present

## 2020-11-22 DIAGNOSIS — Z954 Presence of other heart-valve replacement: Secondary | ICD-10-CM | POA: Diagnosis not present

## 2020-11-22 DIAGNOSIS — Z952 Presence of prosthetic heart valve: Secondary | ICD-10-CM | POA: Diagnosis not present

## 2020-11-22 DIAGNOSIS — K219 Gastro-esophageal reflux disease without esophagitis: Secondary | ICD-10-CM | POA: Diagnosis not present

## 2020-11-22 DIAGNOSIS — I10 Essential (primary) hypertension: Secondary | ICD-10-CM | POA: Diagnosis not present

## 2020-11-22 DIAGNOSIS — I4891 Unspecified atrial fibrillation: Secondary | ICD-10-CM | POA: Diagnosis not present

## 2020-11-24 DIAGNOSIS — E785 Hyperlipidemia, unspecified: Secondary | ICD-10-CM | POA: Diagnosis not present

## 2020-11-24 DIAGNOSIS — I4891 Unspecified atrial fibrillation: Secondary | ICD-10-CM | POA: Diagnosis not present

## 2020-11-24 DIAGNOSIS — Z952 Presence of prosthetic heart valve: Secondary | ICD-10-CM | POA: Diagnosis not present

## 2020-11-24 DIAGNOSIS — Z954 Presence of other heart-valve replacement: Secondary | ICD-10-CM | POA: Diagnosis not present

## 2020-11-24 DIAGNOSIS — K219 Gastro-esophageal reflux disease without esophagitis: Secondary | ICD-10-CM | POA: Diagnosis not present

## 2020-11-24 DIAGNOSIS — I10 Essential (primary) hypertension: Secondary | ICD-10-CM | POA: Diagnosis not present

## 2020-11-24 DIAGNOSIS — G4730 Sleep apnea, unspecified: Secondary | ICD-10-CM | POA: Diagnosis not present

## 2020-11-24 DIAGNOSIS — R011 Cardiac murmur, unspecified: Secondary | ICD-10-CM | POA: Diagnosis not present

## 2020-11-24 DIAGNOSIS — M6530 Trigger finger, unspecified finger: Secondary | ICD-10-CM | POA: Diagnosis not present

## 2020-11-24 DIAGNOSIS — E119 Type 2 diabetes mellitus without complications: Secondary | ICD-10-CM | POA: Diagnosis not present

## 2020-11-24 DIAGNOSIS — D649 Anemia, unspecified: Secondary | ICD-10-CM | POA: Diagnosis not present

## 2020-11-27 DIAGNOSIS — Z954 Presence of other heart-valve replacement: Secondary | ICD-10-CM | POA: Diagnosis not present

## 2020-11-29 DIAGNOSIS — Z954 Presence of other heart-valve replacement: Secondary | ICD-10-CM | POA: Diagnosis not present

## 2020-11-30 ENCOUNTER — Other Ambulatory Visit: Payer: Self-pay | Admitting: Gastroenterology

## 2020-11-30 DIAGNOSIS — K625 Hemorrhage of anus and rectum: Secondary | ICD-10-CM

## 2020-11-30 DIAGNOSIS — R194 Change in bowel habit: Secondary | ICD-10-CM

## 2020-11-30 DIAGNOSIS — N50819 Testicular pain, unspecified: Secondary | ICD-10-CM

## 2020-12-01 DIAGNOSIS — Z954 Presence of other heart-valve replacement: Secondary | ICD-10-CM | POA: Diagnosis not present

## 2020-12-04 DIAGNOSIS — Z954 Presence of other heart-valve replacement: Secondary | ICD-10-CM | POA: Diagnosis not present

## 2020-12-06 ENCOUNTER — Encounter: Payer: Self-pay | Admitting: Physician Assistant

## 2020-12-06 ENCOUNTER — Other Ambulatory Visit: Payer: Self-pay

## 2020-12-06 ENCOUNTER — Ambulatory Visit (INDEPENDENT_AMBULATORY_CARE_PROVIDER_SITE_OTHER): Payer: Medicare Other | Admitting: Physician Assistant

## 2020-12-06 VITALS — BP 130/72 | HR 78 | Temp 97.3°F | Ht 66.0 in | Wt 211.2 lb

## 2020-12-06 DIAGNOSIS — K5909 Other constipation: Secondary | ICD-10-CM

## 2020-12-06 DIAGNOSIS — K5900 Constipation, unspecified: Secondary | ICD-10-CM | POA: Diagnosis not present

## 2020-12-06 NOTE — Progress Notes (Signed)
Acute Office Visit  Subjective:    Patient ID: Megan Galvan, female    DOB: June 26, 1950, 70 y.o.   MRN: [id number]  Chief Complaint  Patient presents with   Constipation    HPI Patient is in today for constipation - pt states that over the past 3-4 weeks she has had trouble having a bm - she states she does have one almost daily but not much of one - intermittently she has also noted bright red blood in stool but has to really strain to have bm She denies nausea, vomiting, fever, or abdominal pain Her last bm was this morning She does see Dr Lyndel Safe and is up to date on colonoscopy  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   21 mm Edwards Resilia Inspiria Bioprosthetic Tissue Valve  SN [id number] Model 11500A   Sleep apnea    cpap    Past Surgical History:  Procedure Laterality Date   ANTERIOR CERVICAL DECOMP/DISCECTOMY FUSION     x 2   AORTIC VALVE REPLACEMENT N/A 08/10/2020   Procedure: AORTIC VALVE REPLACEMENT (AVR) USING INSPIRIS VALVE SIZE 21MM;  Surgeon: Rexene Alberts, MD;  Location: [facility];  Service: Open Heart Surgery;  Laterality: N/A;   BACK SURGERY  11/17/2016   L3-L5   BILATERAL CARPAL TUNNEL RELEASE     bone spur Left    left shoulder   bone spur Right    Right shoulder   COLONOSCOPY  08/22/2011   Moderate predominantly sigmoid diverticulosis. Small internal hemorrhoids.    COLONOSCOPY     HEMORROIDECTOMY  02/2019   NECK SURGERY     RIGHT/LEFT HEART CATH AND CORONARY ANGIOGRAPHY N/A 07/27/2020   Procedure: RIGHT/LEFT HEART CATH AND CORONARY ANGIOGRAPHY;  Surgeon: Sherren Mocha, MD;   Location: [place] [facility];  Service: Cardiovascular;  Laterality: N/A;   TEE WITHOUT CARDIOVERSION N/A 08/10/2020   Procedure: TRANSESOPHAGEAL ECHOCARDIOGRAM (TEE);  Surgeon: Rexene Alberts, MD;  Location: [facility];  Service: Open Heart Surgery;  Laterality: N/A;   torn rotator cuff Right    Right shoulder   torn rotator cuff Left    left shoulder   TRIGGER FINGER RELEASE Left    thumb   TRIGGER FINGER RELEASE Right    thumb and middle finger   UPPER GASTROINTESTINAL ENDOSCOPY     WRIST SURGERY Left    torn ligament   WRIST SURGERY Right    cyst    Family History  Problem Relation Age of Onset   Colon cancer Paternal Grandmother    Esophageal cancer Neg Hx    Rectal cancer Neg Hx    Stomach cancer Neg Hx     Social History   Socioeconomic History   Marital status: Married    Spouse name: Not on file   Number of children: 2   Years of education: Not on file   Highest education level: Not on file  Occupational History   Not on file  Tobacco Use   Smoking status: Never   Smokeless tobacco: Never  Vaping Use   Vaping Use: Never used  Substance and Sexual Activity   Alcohol use: Never   Drug use: Never   Sexual activity: Not on file  Other Topics Concern   Not on file  Social History Narrative   Not on file   Social Determinants of Health   Financial Resource Strain: Not on file  Food Insecurity: Not on file  Transportation Needs: Not on file  Physical Activity: Not on file  Stress: Not on file  Social Connections: Not on file  Intimate Partner Violence: Not on file    Outpatient Medications Prior to Visit  Medication Sig Dispense Refill   acetaminophen (TYLENOL) 500 MG tablet Take 1,000 mg by mouth every 6 (six) hours as needed for moderate pain or headache.     aspirin EC 81 MG tablet Take 1 tablet (81 mg total) by mouth daily. Swallow whole. 90 tablet 3   Blood Glucose Monitoring Suppl (ACCU-CHEK GUIDE ME) w/Device KIT Use as directed 1 kit 0   Calcium  Carb-Cholecalciferol (CALCIUM 600 + D PO) Take 2 tablets by mouth daily.     cetirizine (ZYRTEC) 10 MG tablet Take 10 mg by mouth daily.     chlorthalidone (HYGROTON) 50 MG tablet TAKE 1 TABLET BY MOUTH  DAILY 90 tablet 3   conjugated estrogens (PREMARIN) vaginal cream Place 1 Applicatorful vaginally 2 (two) times a week. 42.5 g 2   diltiazem (CARDIZEM) 30 MG tablet Take 1 tablet (30 mg total) by mouth every 6 (six) hours as needed (If your heart rate is greater than 100.). 30 tablet 3   famotidine (PEPCID) 40 MG tablet TAKE 1 TABLET BY MOUTH AT  BEDTIME 90 tablet 3   fluticasone (FLONASE) 50 MCG/ACT nasal spray Place 1 spray into the nose daily as needed for allergies.     glucose blood (ACCU-CHEK GUIDE) test strip Use as instructed 100 each 3   hydrocortisone (ANUSOL-HC) 25 MG suppository Place 1 suppository (25 mg total) rectally 2 (two) times daily. 30 suppository 2   lubiprostone (AMITIZA) 24 MCG capsule Take 1 capsule (24 mcg total) by mouth 2 (two) times daily with a meal. 60 capsule 2   Melatonin 5 MG CAPS Take 5 mg by mouth at bedtime.     Multiple Vitamin (MULTIVITAMIN WITH MINERALS) TABS tablet Take 1 tablet by mouth daily.     omeprazole (PRILOSEC) 40 MG capsule TAKE 1 CAPSULE BY MOUTH  TWICE DAILY BEFORE MEALS 180 capsule 3   potassium chloride SA (KLOR-CON) 20 MEQ tablet TAKE 2 TABLETS BY MOUTH IN  THE MORNING AND 1 TABLET IN THE EVENING 270 tablet 3   pravastatin (PRAVACHOL) 40 MG tablet TAKE 1 TABLET BY MOUTH AT  BEDTIME 90 tablet 3   PRESCRIPTION MEDICATION Apply 1 application topically 4 (four) times daily as needed (hemorrhoid). Nifedipine 5% ointment  Rx is sent to [facility]. Last called in on 07/19/2020 with 1 yr of refills.     TRADJENTA 5 MG TABS tablet TAKE 1 TABLET BY MOUTH  DAILY 90 tablet 3   valsartan (DIOVAN) 320 MG tablet TAKE 1 TABLET BY MOUTH  DAILY 90 tablet 3   No facility-administered medications prior to visit.    Allergies  Allergen Reactions    Ace Inhibitors Cough   Farxiga [Dapagliflozin]     Yeast infections    Jardiance [Empagliflozin]     Yeast infections   Keflex [Cephalexin] Itching   Latex     Burn and itch  Nexium [Esomeprazole Magnesium] Diarrhea   Protonix [Pantoprazole Sodium] Other (See Comments)    Constipation   Doxycycline Rash    Review of Systems CONSTITUTIONAL: Negative for chills, fatigue, fever, unintentional weight gain and unintentional weight loss.  CARDIOVASCULAR: Negative for chest pain, dizziness, palpitations and pedal edema.  RESPIRATORY: Negative for recent cough and dyspnea.  GASTROINTESTINAL: see HPI      Objective:    Physical Exam PHYSICAL EXAM:   VS: BP 130/72 (BP Location: Left Arm, Patient Position: Sitting, Cuff Size: Normal)   Pulse 78   Temp (!) 97.3 F (36.3 C) (Temporal)   Ht $R'5\' 6"'rv$  (1.676 m)   Wt 211 lb 3.2 oz (95.8 kg)   SpO2 95%   BMI 34.09 kg/m   GEN: Well nourished, well developed, in no acute distress  Cardiac: RRR; no murmurs, rubs, or gallops, Respiratory:  normal respiratory rate and pattern with no distress - normal breath sounds with no rales, rhonchi, wheezes or rubs GI: normal bowel sounds, no masses or tenderness   BP 130/72 (BP Location: Left Arm, Patient Position: Sitting, Cuff Size: Normal)   Pulse 78   Temp (!) 97.3 F (36.3 C) (Temporal)   Ht $R'5\' 6"'IJ$  (1.676 m)   Wt 211 lb 3.2 oz (95.8 kg)   SpO2 95%   BMI 34.09 kg/m  Wt Readings from Last 3 Encounters:  12/06/20 211 lb 3.2 oz (95.8 kg)  10/24/20 210 lb 3.2 oz (95.3 kg)  09/28/20 205 lb (93 kg)    Health Maintenance Due  Topic Date Due   Hepatitis C Screening  Never done   Zoster Vaccines- Shingrix (1 of 2) Never done   PNA vac Low Risk Adult (2 of 2 - PPSV23) 04/13/2017   COVID-19 Vaccine (4 - Booster for Moderna series) 05/31/2020   INFLUENZA VACCINE  11/27/2020    There are no preventive care reminders to display for this patient.   Lab Results  Component Value Date   TSH  1.690 09/28/2020   Lab Results  Component Value Date   WBC 6.2 09/28/2020   HGB 11.5 09/28/2020   HCT 35.1 09/28/2020   MCV 95 09/28/2020   PLT 233 09/28/2020   Lab Results  Component Value Date   NA 141 09/28/2020   K 4.5 09/28/2020   CO2 27 09/28/2020   GLUCOSE 105 (H) 09/28/2020   BUN 16 09/28/2020   CREATININE 0.96 09/28/2020   BILITOT 0.3 09/28/2020   ALKPHOS 70 09/28/2020   AST 28 09/28/2020   ALT 19 09/28/2020   PROT 7.2 09/28/2020   ALBUMIN 4.5 09/28/2020   CALCIUM 9.6 09/28/2020   ANIONGAP 10 08/13/2020   EGFR 64 09/28/2020   Lab Results  Component Value Date   CHOL 119 09/11/2020   Lab Results  Component Value Date   HDL 44 09/11/2020   Lab Results  Component Value Date   LDLCALC 57 09/11/2020   Lab Results  Component Value Date   TRIG 91 09/11/2020   Lab Results  Component Value Date   CHOLHDL 2.7 09/11/2020   Lab Results  Component Value Date   HGBA1C 6.5 (H) 08/08/2020       Assessment & Plan:  1. Other constipation - DG Abd 2 Views - CBC with Differential/Platelet - Comprehensive metabolic panel  Will recommend treatment after reviewing xray report Recommend pt call Lyndel Safe and make follow up appt  No orders of the defined types were placed in this encounter.   Orders Placed This Encounter  Procedures   DG Abd 2 Views   CBC with Differential/Platelet   Comprehensive metabolic panel      Follow-up: Return if symptoms worsen or fail to improve.  An After Visit Summary was printed and given to the patient.  Yetta Flock [facility] [phone]

## 2020-12-07 LAB — COMPREHENSIVE METABOLIC PANEL
ALT: 23 IU/L (ref 0–32)
AST: 29 IU/L (ref 0–40)
Albumin/Globulin Ratio: 1.7 (ref 1.2–2.2)
Albumin: 4.3 g/dL (ref 3.8–4.8)
Alkaline Phosphatase: 75 IU/L (ref 44–121)
BUN/Creatinine Ratio: 17 (ref 12–28)
BUN: 17 mg/dL (ref 8–27)
Bilirubin Total: 0.3 mg/dL (ref 0.0–1.2)
CO2: 29 mmol/L (ref 20–29)
Calcium: 9.4 mg/dL (ref 8.7–10.3)
Chloride: 99 mmol/L (ref 96–106)
Creatinine, Ser: 1 mg/dL (ref 0.57–1.00)
Globulin, Total: 2.6 g/dL (ref 1.5–4.5)
Glucose: 122 mg/dL — ABNORMAL HIGH (ref 65–99)
Potassium: 4.5 mmol/L (ref 3.5–5.2)
Sodium: 141 mmol/L (ref 134–144)
Total Protein: 6.9 g/dL (ref 6.0–8.5)
eGFR: 61 mL/min/{1.73_m2} (ref >59)

## 2020-12-07 LAB — CBC WITH DIFFERENTIAL/PLATELET
Basophils Absolute: 0.1 10*3/uL (ref 0.0–0.2)
Basos: 1 %
EOS (ABSOLUTE): 0.1 10*3/uL (ref 0.0–0.4)
Eos: 2 %
Hematocrit: 36.5 % (ref 34.0–46.6)
Hemoglobin: 12.1 g/dL (ref 11.1–15.9)
Immature Grans (Abs): 0 10*3/uL (ref 0.0–0.1)
Immature Granulocytes: 0 %
Lymphocytes Absolute: 2.3 10*3/uL (ref 0.7–3.1)
Lymphs: 39 %
MCH: 31.6 pg (ref 26.6–33.0)
MCHC: 33.2 g/dL (ref 31.5–35.7)
MCV: 95 fL (ref 79–97)
Monocytes Absolute: 0.8 10*3/uL (ref 0.1–0.9)
Monocytes: 13 %
Neutrophils Absolute: 2.6 10*3/uL (ref 1.4–7.0)
Neutrophils: 45 %
Platelets: 221 10*3/uL (ref 150–450)
RBC: 3.83 x10E6/uL (ref 3.77–5.28)
RDW: 14.5 % (ref 11.7–15.4)
WBC: 5.9 10*3/uL (ref 3.4–10.8)

## 2020-12-08 DIAGNOSIS — Z954 Presence of other heart-valve replacement: Secondary | ICD-10-CM | POA: Diagnosis not present

## 2020-12-11 ENCOUNTER — Telehealth: Payer: Self-pay | Admitting: Gastroenterology

## 2020-12-11 DIAGNOSIS — R194 Change in bowel habit: Secondary | ICD-10-CM

## 2020-12-11 DIAGNOSIS — Z954 Presence of other heart-valve replacement: Secondary | ICD-10-CM | POA: Diagnosis not present

## 2020-12-11 NOTE — Telephone Encounter (Signed)
Patient called stating she has had constipation for 1 monoth. Patient reports she has "taken everything" Took one dose of Milk of Mag and said it did not work. Patient had XRays done today and told to let Dr Lyndel Safe be aware so he can also look at them. Patient requesting recommendations. Please Advise, thank you.  The only scan I see is from 8/12 2 view ABD

## 2020-12-11 NOTE — Telephone Encounter (Signed)
Attempted to contact patient, noanswer. Left message to call back.

## 2020-12-13 ENCOUNTER — Ambulatory Visit (INDEPENDENT_AMBULATORY_CARE_PROVIDER_SITE_OTHER): Payer: Medicare Other | Admitting: Family Medicine

## 2020-12-13 ENCOUNTER — Other Ambulatory Visit: Payer: Self-pay

## 2020-12-13 ENCOUNTER — Encounter: Payer: Self-pay | Admitting: Family Medicine

## 2020-12-13 VITALS — BP 116/62 | HR 79 | Temp 96.7°F | Ht 66.0 in

## 2020-12-13 DIAGNOSIS — E1142 Type 2 diabetes mellitus with diabetic polyneuropathy: Secondary | ICD-10-CM

## 2020-12-13 DIAGNOSIS — K219 Gastro-esophageal reflux disease without esophagitis: Secondary | ICD-10-CM

## 2020-12-13 DIAGNOSIS — Z1231 Encounter for screening mammogram for malignant neoplasm of breast: Secondary | ICD-10-CM | POA: Diagnosis not present

## 2020-12-13 DIAGNOSIS — E782 Mixed hyperlipidemia: Secondary | ICD-10-CM

## 2020-12-13 DIAGNOSIS — Z954 Presence of other heart-valve replacement: Secondary | ICD-10-CM | POA: Diagnosis not present

## 2020-12-13 DIAGNOSIS — I10 Essential (primary) hypertension: Secondary | ICD-10-CM

## 2020-12-13 DIAGNOSIS — Z23 Encounter for immunization: Secondary | ICD-10-CM | POA: Diagnosis not present

## 2020-12-13 LAB — POCT URINALYSIS DIP (CLINITEK)
Bilirubin, UA: NEGATIVE
Blood, UA: NEGATIVE
Glucose, UA: NEGATIVE mg/dL
Ketones, POC UA: NEGATIVE mg/dL
Leukocytes, UA: NEGATIVE
Nitrite, UA: NEGATIVE
POC PROTEIN,UA: NEGATIVE
Spec Grav, UA: 1.015 (ref 1.010–1.025)
Urobilinogen, UA: 0.2 E.U./dL
pH, UA: 6.5 (ref 5.0–8.0)

## 2020-12-13 LAB — POCT UA - MICROALBUMIN: Microalbumin Ur, POC: 10 mg/L

## 2020-12-13 NOTE — Progress Notes (Signed)
Subjective:  Patient ID: Megan Galvan, female    DOB: June 28, 1950  Age: 70 y.o. MRN: [id number]  Chief Complaint  Patient presents with   Hyperlipidemia   Gastroesophageal Reflux   DM- taking Januvia 50 mg daily, checks FBS daily and ranges from 140-150, checks feet daiy, maintains healthy diet. Last HgA1C 6.5. She keeps scheduled yearly eye exams and is scheduled to go in November this year.  Cholesterol- Pravastatin 40 mg daily. She is eating healthy. She is s/p CABG in April and is going to cardiac rehab until September.   GERD-taking Prilosec 40 mg daily  Constipation- taking Amitiza 24 mcg daily  HTN-Hygroton 50 mg daily, Cardizem 30 mg as needed, and Diovan 320 mg daily. She is also eating healthy and goes to cardiac rehab Current Outpatient Medications on File Prior to Visit  Medication Sig Dispense Refill   sitaGLIPtin (JANUVIA) 50 MG tablet Take 50 mg by mouth daily.     acetaminophen (TYLENOL) 500 MG tablet Take 1,000 mg by mouth every 6 (six) hours as needed for moderate pain or headache.     aspirin EC 81 MG tablet Take 1 tablet (81 mg total) by mouth daily. Swallow whole. 90 tablet 3   Blood Glucose Monitoring Suppl (ACCU-CHEK GUIDE ME) w/Device KIT Use as directed 1 kit 0   Calcium Carb-Cholecalciferol (CALCIUM 600 + D PO) Take 2 tablets by mouth daily.     cetirizine (ZYRTEC) 10 MG tablet Take 10 mg by mouth daily.     chlorthalidone (HYGROTON) 50 MG tablet TAKE 1 TABLET BY MOUTH  DAILY 90 tablet 3   conjugated estrogens (PREMARIN) vaginal cream Place 1 Applicatorful vaginally 2 (two) times a week. 42.5 g 2   diltiazem (CARDIZEM) 30 MG tablet Take 1 tablet (30 mg total) by mouth every 6 (six) hours as needed (If your heart rate is greater than 100.). 30 tablet 3   famotidine (PEPCID) 40 MG tablet TAKE 1 TABLET BY MOUTH AT  BEDTIME 90 tablet 3   fluticasone (FLONASE) 50 MCG/ACT nasal spray Place 1 spray into the nose daily as needed for allergies.     glucose blood  (ACCU-CHEK GUIDE) test strip Use as instructed 100 each 3   hydrocortisone (ANUSOL-HC) 25 MG suppository Place 1 suppository (25 mg total) rectally 2 (two) times daily. 30 suppository 2   lubiprostone (AMITIZA) 24 MCG capsule Take 1 capsule (24 mcg total) by mouth 2 (two) times daily with a meal. 60 capsule 2   Melatonin 5 MG CAPS Take 5 mg by mouth at bedtime.     Multiple Vitamin (MULTIVITAMIN WITH MINERALS) TABS tablet Take 1 tablet by mouth daily.     omeprazole (PRILOSEC) 40 MG capsule TAKE 1 CAPSULE BY MOUTH  TWICE DAILY BEFORE MEALS 180 capsule 3   potassium chloride SA (KLOR-CON) 20 MEQ tablet TAKE 2 TABLETS BY MOUTH IN  THE MORNING AND 1 TABLET IN THE EVENING 270 tablet 3   pravastatin (PRAVACHOL) 40 MG tablet TAKE 1 TABLET BY MOUTH AT  BEDTIME 90 tablet 3   PRESCRIPTION MEDICATION Apply 1 application topically 4 (four) times daily as needed (hemorrhoid). Nifedipine 5% ointment  Rx is sent to [facility]. Last called in on 07/19/2020 with 1 yr of refills.     valsartan (DIOVAN) 320 MG tablet TAKE 1 TABLET BY MOUTH  DAILY 90 tablet 3   No current facility-administered medications on file prior to visit.   Past Medical History:  Diagnosis Date  Allergy    Anemia    Arthritis    Bone spur of acromioclavicular joint, left    Cataract    Diabetes mellitus without complication (HCC)    GERD (gastroesophageal reflux disease)    Headache    Heart murmur    Hyperlipidemia    Hypertension    Nonrheumatic aortic (valve) stenosis    Plantar fasciitis    S/P aortic valve replacement with bioprosthetic valve 08/10/2020   21 mm Edwards Resilia Inspiria Bioprosthetic Tissue Valve  SN [id number] Model 11500A   Sleep apnea    cpap   Past Surgical History:  Procedure Laterality Date   ANTERIOR CERVICAL DECOMP/DISCECTOMY FUSION     x 2   AORTIC VALVE REPLACEMENT N/A 08/10/2020   Procedure: AORTIC VALVE REPLACEMENT (AVR) USING INSPIRIS VALVE SIZE 21MM;  Surgeon: Rexene Alberts, MD;   Location: [facility];  Service: Open Heart Surgery;  Laterality: N/A;   BACK SURGERY  11/17/2016   L3-L5   BILATERAL CARPAL TUNNEL RELEASE     bone spur Left    left shoulder   bone spur Right    Right shoulder   COLONOSCOPY  08/22/2011   Moderate predominantly sigmoid diverticulosis. Small internal hemorrhoids.    COLONOSCOPY     HEMORROIDECTOMY  02/2019   NECK SURGERY     RIGHT/LEFT HEART CATH AND CORONARY ANGIOGRAPHY N/A 07/27/2020   Procedure: RIGHT/LEFT HEART CATH AND CORONARY ANGIOGRAPHY;  Surgeon: Sherren Mocha, MD;  Location: [place] [facility];  Service: Cardiovascular;  Laterality: N/A;   TEE WITHOUT CARDIOVERSION N/A 08/10/2020   Procedure: TRANSESOPHAGEAL ECHOCARDIOGRAM (TEE);  Surgeon: Rexene Alberts, MD;  Location: [facility];  Service: Open Heart Surgery;  Laterality: N/A;   torn rotator cuff Right    Right shoulder   torn rotator cuff Left    left shoulder   TRIGGER FINGER RELEASE Left    thumb   TRIGGER FINGER RELEASE Right    thumb and middle finger   UPPER GASTROINTESTINAL ENDOSCOPY     WRIST SURGERY Left    torn ligament   WRIST SURGERY Right    cyst    Family History  Problem Relation Age of Onset   Colon cancer Paternal Grandmother    Esophageal cancer Neg Hx    Rectal cancer Neg Hx    Stomach cancer Neg Hx    Social History   Socioeconomic History   Marital status: Married    Spouse name: Not on file   Number of children: 2   Years of education: Not on file   Highest education level: Not on file  Occupational History   Not on file  Tobacco Use   Smoking status: Never   Smokeless tobacco: Never  Vaping Use   Vaping Use: Never used  Substance and Sexual Activity   Alcohol use: Never   Drug use: Never   Sexual activity: Not on file  Other Topics Concern   Not on file  Social History Narrative   Not on file   Social Determinants of Health   Financial Resource Strain: Not on file  Food Insecurity: Not on file  Transportation Needs: Not on  file  Physical Activity: Not on file  Stress: Not on file  Social Connections: Not on file    Review of Systems  Constitutional:  Negative for chills, fatigue and fever.  HENT:  Negative for congestion, ear pain, rhinorrhea and sore throat.   Respiratory:  Negative for cough and shortness of breath.  Cardiovascular:  Negative for chest pain.  Gastrointestinal:  Positive for constipation. Negative for abdominal pain, diarrhea, nausea and vomiting.  Endocrine: Negative for cold intolerance, heat intolerance, polydipsia, polyphagia and polyuria.  Genitourinary:  Negative for dysuria and urgency.  Musculoskeletal:  Positive for arthralgias, back pain (chronic) and myalgias.  Neurological:  Negative for dizziness, weakness, light-headedness and headaches.  Psychiatric/Behavioral:  Negative for dysphoric mood. The patient is not nervous/anxious.     Objective:  BP 116/62   Pulse 79   Temp (!) 96.7 F (35.9 C)   Ht _0  (1.676 m)   SpO2 99%   BMI 34.09 kg/m   BP/Weight 12/13/2020 12/06/2020 7/56/4332  Systolic BP 951 884 166  Diastolic BP 62 72 64  Wt. (Lbs) - 211.2 210.2  BMI 34.09 34.09 33.93    Physical Exam Constitutional:      Appearance: Normal appearance.  HENT:     Head: Normocephalic.     Right Ear: Tympanic membrane, ear canal and external ear normal.     Left Ear: Tympanic membrane, ear canal and external ear normal.     Nose: Nose normal.     Mouth/Throat:     Mouth: Mucous membranes are moist.     Pharynx: Oropharynx is clear.  Neck:     Vascular: No carotid bruit.  Cardiovascular:     Rate and Rhythm: Normal rate and regular rhythm.     Pulses: Normal pulses.     Heart sounds: Murmur heard.  Pulmonary:     Effort: Pulmonary effort is normal.     Breath sounds: Normal breath sounds.  Abdominal:     General: Abdomen is flat. Bowel sounds are normal.     Palpations: Abdomen is soft.  Skin:    General: Skin is warm and dry.  Neurological:     General:  No focal deficit present.     Mental Status: She is alert and oriented to person, place, and time.  Psychiatric:        Mood and Affect: Mood normal.        Behavior: Behavior normal.  Diabetic Foot Exam - Simple   Simple Foot Form Visual Inspection No deformities, no ulcerations, no other skin breakdown bilaterally: Yes Sensation Testing Intact to touch and monofilament testing bilaterally: Yes Pulse Check Posterior Tibialis and Dorsalis pulse intact bilaterally: Yes Comments      Lab Results  Component Value Date   WBC 5.9 12/06/2020   HGB 12.1 12/06/2020   HCT 36.5 12/06/2020   PLT 221 12/06/2020   GLUCOSE 122 (H) 12/06/2020   CHOL 119 09/11/2020   TRIG 91 09/11/2020   HDL 44 09/11/2020   LDLCALC 57 09/11/2020   ALT 23 12/06/2020   AST 29 12/06/2020   NA 141 12/06/2020   K 4.5 12/06/2020   CL 99 12/06/2020   CREATININE 1.00 12/06/2020   BUN 17 12/06/2020   CO2 29 12/06/2020   TSH 1.690 09/28/2020   INR 1.5 (H) 08/10/2020   HGBA1C 6.5 (H) 08/08/2020   MICROALBUR 10 05/09/2020      Assessment & Plan:  1. Type 2 diabetes mellitus with diabetic polyneuropathy, without long-term current use of insulin (HCC) - Well controlled - CBC with Differential/Platelet - Hemoglobin A1c - POCT UA - Microalbumin - POCT URINALYSIS DIP (CLINITEK) - Continue Januvia 50 mg daily  2. Essential hypertension - Well controlled - Comprehensive metabolic panel - Continue Hydgroton 50 mg daily - Continue Cardizem 30 mg prn if HR is  greater than 100 - Continue Diovan 320 mg daily - POCT URINALYSIS DIP (CLINITEK)  3. Mixed hyperlipidemia - Well controlled  - Lipid panel - Continue Pravachol 40 mg at bedtime - POCT URINALYSIS DIP (CLINITEK)  4. Gastro-esophageal reflux disease without esophagitis - Controlled - Continue Prilosec 40 mg bid - POCT URINALYSIS DIP (CLINITEK)  5. Screening mammogram for breast cancer - MM DIGITAL SCREENING BILATERAL - POCT URINALYSIS DIP  (CLINITEK)  6. Need for vaccination - POCT URINALYSIS DIP (CLINITEK)    Gurney Maxin, RN (NP Student)  I attest that the above note was reviewed and is accurate. The patient was fully evaluated and examined by myself. I agree with plan.    Follow-up: Return in about 3 months (around 03/15/2021) for fasting.  An After Visit Summary was printed and given to the patient.  Rochel Brome, MD [facility] [phone]

## 2020-12-13 NOTE — Progress Notes (Deleted)
Subjective:  Patient ID: Megan Galvan, female    DOB: 12-19-1950  Age: 70 y.o. MRN: [id number]  Chief Complaint  Patient presents with   Hyperlipidemia   Gastroesophageal Reflux     HPI  DM- taking Januvia 50 mg daily, checks FBS daily ranges from 140-150, checks feet daiy, maintains healthy diet. Is interested in seeing a diabetic counselor to learn more about what she should eat/avoid.  Cholesterol- Pravastatin 40 mg dialy  GERD-taking prilosec 40 mg daily  Constipation-amitiza 24 mcg daily  HTN-hygroton 50 mg daily and cardizem 30 mg as needed Current Outpatient Medications on File Prior to Visit  Medication Sig Dispense Refill   acetaminophen (TYLENOL) 500 MG tablet Take 1,000 mg by mouth every 6 (six) hours as needed for moderate pain or headache.     aspirin EC 81 MG tablet Take 1 tablet (81 mg total) by mouth daily. Swallow whole. 90 tablet 3   Blood Glucose Monitoring Suppl (ACCU-CHEK GUIDE ME) w/Device KIT Use as directed 1 kit 0   Calcium Carb-Cholecalciferol (CALCIUM 600 + D PO) Take 2 tablets by mouth daily.     cetirizine (ZYRTEC) 10 MG tablet Take 10 mg by mouth daily.     chlorthalidone (HYGROTON) 50 MG tablet TAKE 1 TABLET BY MOUTH  DAILY 90 tablet 3   conjugated estrogens (PREMARIN) vaginal cream Place 1 Applicatorful vaginally 2 (two) times a week. 42.5 g 2   diltiazem (CARDIZEM) 30 MG tablet Take 1 tablet (30 mg total) by mouth every 6 (six) hours as needed (If your heart rate is greater than 100.). 30 tablet 3   famotidine (PEPCID) 40 MG tablet TAKE 1 TABLET BY MOUTH AT  BEDTIME 90 tablet 3   fluticasone (FLONASE) 50 MCG/ACT nasal spray Place 1 spray into the nose daily as needed for allergies.     glucose blood (ACCU-CHEK GUIDE) test strip Use as instructed 100 each 3   hydrocortisone (ANUSOL-HC) 25 MG suppository Place 1 suppository (25 mg total) rectally 2 (two) times daily. 30 suppository 2   lubiprostone (AMITIZA) 24 MCG capsule Take 1 capsule (24 mcg  total) by mouth 2 (two) times daily with a meal. 60 capsule 2   Melatonin 5 MG CAPS Take 5 mg by mouth at bedtime.     Multiple Vitamin (MULTIVITAMIN WITH MINERALS) TABS tablet Take 1 tablet by mouth daily.     omeprazole (PRILOSEC) 40 MG capsule TAKE 1 CAPSULE BY MOUTH  TWICE DAILY BEFORE MEALS 180 capsule 3   potassium chloride SA (KLOR-CON) 20 MEQ tablet TAKE 2 TABLETS BY MOUTH IN  THE MORNING AND 1 TABLET IN THE EVENING 270 tablet 3   pravastatin (PRAVACHOL) 40 MG tablet TAKE 1 TABLET BY MOUTH AT  BEDTIME 90 tablet 3   PRESCRIPTION MEDICATION Apply 1 application topically 4 (four) times daily as needed (hemorrhoid). Nifedipine 5% ointment  Rx is sent to [facility]. Last called in on 07/19/2020 with 1 yr of refills.     TRADJENTA 5 MG TABS tablet TAKE 1 TABLET BY MOUTH  DAILY 90 tablet 3   valsartan (DIOVAN) 320 MG tablet TAKE 1 TABLET BY MOUTH  DAILY 90 tablet 3   No current facility-administered medications on file prior to visit.   Past Medical History:  Diagnosis Date   Allergy    Anemia    Arthritis    Bone spur of acromioclavicular joint, left    Cataract    Diabetes mellitus without complication ([place])  GERD (gastroesophageal reflux disease)    Headache    Heart murmur    Hyperlipidemia    Hypertension    Nonrheumatic aortic (valve) stenosis    Plantar fasciitis    S/P aortic valve replacement with bioprosthetic valve 08/10/2020   21 mm Edwards Resilia Inspiria Bioprosthetic Tissue Valve  SN [id number] Model 11500A   Sleep apnea    cpap   Past Surgical History:  Procedure Laterality Date   ANTERIOR CERVICAL DECOMP/DISCECTOMY FUSION     x 2   AORTIC VALVE REPLACEMENT N/A 08/10/2020   Procedure: AORTIC VALVE REPLACEMENT (AVR) USING INSPIRIS VALVE SIZE 21MM;  Surgeon: Rexene Alberts, MD;  Location: [place];  Service: Open Heart Surgery;  Laterality: N/A;   BACK SURGERY  11/17/2016   L3-L5   BILATERAL CARPAL TUNNEL RELEASE     bone spur Left    left shoulder    bone spur Right    Right shoulder   COLONOSCOPY  08/22/2011   Moderate predominantly sigmoid diverticulosis. Small internal hemorrhoids.    COLONOSCOPY     HEMORROIDECTOMY  02/2019   NECK SURGERY     RIGHT/LEFT HEART CATH AND CORONARY ANGIOGRAPHY N/A 07/27/2020   Procedure: RIGHT/LEFT HEART CATH AND CORONARY ANGIOGRAPHY;  Surgeon: Sherren Mocha, MD;  Location: [place] [facility];  Service: Cardiovascular;  Laterality: N/A;   TEE WITHOUT CARDIOVERSION N/A 08/10/2020   Procedure: TRANSESOPHAGEAL ECHOCARDIOGRAM (TEE);  Surgeon: Rexene Alberts, MD;  Location: [place];  Service: Open Heart Surgery;  Laterality: N/A;   torn rotator cuff Right    Right shoulder   torn rotator cuff Left    left shoulder   TRIGGER FINGER RELEASE Left    thumb   TRIGGER FINGER RELEASE Right    thumb and middle finger   UPPER GASTROINTESTINAL ENDOSCOPY     WRIST SURGERY Left    torn ligament   WRIST SURGERY Right    cyst    Family History  Problem Relation Age of Onset   Colon cancer Paternal Grandmother    Esophageal cancer Neg Hx    Rectal cancer Neg Hx    Stomach cancer Neg Hx    Social History   Socioeconomic History   Marital status: Married    Spouse name: Not on file   Number of children: 2   Years of education: Not on file   Highest education level: Not on file  Occupational History   Not on file  Tobacco Use   Smoking status: Never   Smokeless tobacco: Never  Vaping Use   Vaping Use: Never used  Substance and Sexual Activity   Alcohol use: Never   Drug use: Never   Sexual activity: Not on file  Other Topics Concern   Not on file  Social History Narrative   Not on file   Social Determinants of Health   Financial Resource Strain: Not on file  Food Insecurity: Not on file  Transportation Needs: Not on file  Physical Activity: Not on file  Stress: Not on file  Social Connections: Not on file    Review of Systems  Constitutional:  Negative for chills, fatigue and fever.   HENT:  Negative for congestion, ear pain, rhinorrhea and sore throat.   Respiratory:  Negative for cough and shortness of breath.   Cardiovascular:  Negative for chest pain.  Gastrointestinal:  Positive for constipation. Negative for abdominal pain, diarrhea, nausea and vomiting.  Genitourinary:  Negative for dysuria and urgency.  Vaginal itching  Musculoskeletal:  Positive for arthralgias, back pain and myalgias.  Neurological:  Negative for dizziness, weakness, light-headedness and headaches.  Psychiatric/Behavioral:  Negative for dysphoric mood. The patient is not nervous/anxious.     Objective:  Pulse 79   Temp (!) 96.7 F (35.9 C)   Ht _0  (1.676 m)   SpO2 99%   BMI 34.09 kg/m   BP/Weight 12/13/2020 12/06/2020 12/26/5619  Systolic BP - 308 657  Diastolic BP - 72 64  Wt. (Lbs) - 211.2 210.2  BMI 34.09 34.09 33.93    Physical Exam  Diabetic Foot Exam - Simple   No data filed      Lab Results  Component Value Date   WBC 5.9 12/06/2020   HGB 12.1 12/06/2020   HCT 36.5 12/06/2020   PLT 221 12/06/2020   GLUCOSE 122 (H) 12/06/2020   CHOL 119 09/11/2020   TRIG 91 09/11/2020   HDL 44 09/11/2020   LDLCALC 57 09/11/2020   ALT 23 12/06/2020   AST 29 12/06/2020   NA 141 12/06/2020   K 4.5 12/06/2020   CL 99 12/06/2020   CREATININE 1.00 12/06/2020   BUN 17 12/06/2020   CO2 29 12/06/2020   TSH 1.690 09/28/2020   INR 1.5 (H) 08/10/2020   HGBA1C 6.5 (H) 08/08/2020   MICROALBUR 10 05/09/2020      Assessment & Plan:   1. Type 2 diabetes mellitus with diabetic polyneuropathy, without long-term current use of insulin (HCC) Recommend check sugars fasting daily. Recommend check feet daily. Recommend annual eye exams. Continue to work on eating a healthy diet and exercise.  Labs drawn today.    - CBC with Differential/Platelet - Hemoglobin A1c - POCT UA - Microalbumin - POCT URINALYSIS DIP (CLINITEK)  2. Essential hypertension Well controlled.  No  changes to medicines.  Continue to work on eating a healthy diet and exercise.  Labs drawn today.   - Comprehensive metabolic panel   3. Mixed hyperlipidemia Well controlled.  No changes to medicines.  Continue to work on eating a healthy diet and exercise.  Labs drawn today.   - Lipid panel   4. Gastro-esophageal reflux disease without esophagitis   5. Screening mammogram for breast cancer - MM DIGITAL SCREENING BILATERAL   6. Need for vaccination   No orders of the defined types were placed in this encounter.   No orders of the defined types were placed in this encounter.    Follow-up: No follow-ups on file.  An After Visit Summary was printed and given to the patient.  Rochel Brome, MD [facility] [phone]

## 2020-12-14 LAB — CBC WITH DIFFERENTIAL/PLATELET
Basophils Absolute: 0.1 10*3/uL (ref 0.0–0.2)
Basos: 1 %
EOS (ABSOLUTE): 0.1 10*3/uL (ref 0.0–0.4)
Eos: 2 %
Hematocrit: 36.7 % (ref 34.0–46.6)
Hemoglobin: 12 g/dL (ref 11.1–15.9)
Immature Grans (Abs): 0 10*3/uL (ref 0.0–0.1)
Immature Granulocytes: 0 %
Lymphocytes Absolute: 2.7 10*3/uL (ref 0.7–3.1)
Lymphs: 43 %
MCH: 30.9 pg (ref 26.6–33.0)
MCHC: 32.7 g/dL (ref 31.5–35.7)
MCV: 95 fL (ref 79–97)
Monocytes Absolute: 0.7 10*3/uL (ref 0.1–0.9)
Monocytes: 11 %
Neutrophils Absolute: 2.6 10*3/uL (ref 1.4–7.0)
Neutrophils: 43 %
Platelets: 218 10*3/uL (ref 150–450)
RBC: 3.88 x10E6/uL (ref 3.77–5.28)
RDW: 14.5 % (ref 11.7–15.4)
WBC: 6.2 10*3/uL (ref 3.4–10.8)

## 2020-12-14 LAB — COMPREHENSIVE METABOLIC PANEL
ALT: 27 IU/L (ref 0–32)
AST: 28 IU/L (ref 0–40)
Albumin/Globulin Ratio: 1.6 (ref 1.2–2.2)
Albumin: 4.5 g/dL (ref 3.8–4.8)
Alkaline Phosphatase: 68 IU/L (ref 44–121)
BUN/Creatinine Ratio: 18 (ref 12–28)
BUN: 16 mg/dL (ref 8–27)
Bilirubin Total: 0.5 mg/dL (ref 0.0–1.2)
CO2: 26 mmol/L (ref 20–29)
Calcium: 9.5 mg/dL (ref 8.7–10.3)
Chloride: 96 mmol/L (ref 96–106)
Creatinine, Ser: 0.89 mg/dL (ref 0.57–1.00)
Globulin, Total: 2.9 g/dL (ref 1.5–4.5)
Glucose: 115 mg/dL — ABNORMAL HIGH (ref 65–99)
Potassium: 3.3 mmol/L — ABNORMAL LOW (ref 3.5–5.2)
Sodium: 139 mmol/L (ref 134–144)
Total Protein: 7.4 g/dL (ref 6.0–8.5)
eGFR: 70 mL/min/{1.73_m2} (ref >59)

## 2020-12-14 LAB — LIPID PANEL
Chol/HDL Ratio: 2.6 ratio (ref 0.0–4.4)
Cholesterol, Total: 143 mg/dL (ref 100–199)
HDL: 55 mg/dL (ref >39)
LDL Chol Calc (NIH): 71 mg/dL (ref 0–99)
Triglycerides: 89 mg/dL (ref 0–149)
VLDL Cholesterol Cal: 17 mg/dL (ref 5–40)

## 2020-12-14 LAB — HEMOGLOBIN A1C
Est. average glucose Bld gHb Est-mCnc: 143 mg/dL
Hgb A1c MFr Bld: 6.6 % — ABNORMAL HIGH (ref 4.8–5.6)

## 2020-12-14 LAB — CARDIOVASCULAR RISK ASSESSMENT

## 2020-12-14 MED ORDER — LINACLOTIDE 145 MCG PO CAPS: 145.0000 ug | ORAL_CAPSULE | Freq: Every day | ORAL | Status: DC

## 2020-12-14 NOTE — Telephone Encounter (Signed)
Spoke with patient, patient aware of rec's. Medication has been sent to pharmacy. Nothing further needed at this time.

## 2020-12-14 NOTE — Telephone Encounter (Signed)
Have reviewed recent x-ray [facility] report-moderate amount of stool without obstruction  Plan: -Lets give her a trial of Linzess 145 MCG p.o. once a day.  If she can get samples, that will be great. -Must continue taking MiraLAX 17 g p.o. twice daily until good bowel movement, then once a day.  RG

## 2020-12-14 NOTE — Telephone Encounter (Signed)
Attempted to contact patient, no answer. Left message to call back.

## 2020-12-15 ENCOUNTER — Other Ambulatory Visit: Payer: Self-pay | Admitting: *Deleted

## 2020-12-15 DIAGNOSIS — K5901 Slow transit constipation: Secondary | ICD-10-CM

## 2020-12-15 DIAGNOSIS — Z954 Presence of other heart-valve replacement: Secondary | ICD-10-CM | POA: Diagnosis not present

## 2020-12-15 MED ORDER — LINACLOTIDE 145 MCG PO CAPS: 145.0000 ug | ORAL_CAPSULE | Freq: Every day | ORAL | Status: DC

## 2020-12-15 MED ORDER — LINACLOTIDE 145 MCG PO CAPS: 145.0000 ug | ORAL_CAPSULE | Freq: Every day | ORAL | 0 refills | Status: DC

## 2020-12-15 MED ORDER — LUBIPROSTONE 24 MCG PO CAPS: 24.0000 ug | ORAL_CAPSULE | Freq: Two times a day (BID) | ORAL | 2 refills | Status: DC

## 2020-12-15 NOTE — Addendum Note (Signed)
Addended by: Thedore Mins D on: 12/15/2020 03:41 PM   Modules accepted: Orders

## 2020-12-15 NOTE — Telephone Encounter (Signed)
Pt called stating that she called her pharmacy one hour ago and they have not yet received prescription for Linzess. Pls send it again to [facility] in [place].

## 2020-12-15 NOTE — Telephone Encounter (Signed)
Medication sent to pharmacy in correct dispensing form (clinic vs pharmacy).Spoke with patient and made her aware.

## 2020-12-20 DIAGNOSIS — Z954 Presence of other heart-valve replacement: Secondary | ICD-10-CM | POA: Diagnosis not present

## 2020-12-22 DIAGNOSIS — Z954 Presence of other heart-valve replacement: Secondary | ICD-10-CM | POA: Diagnosis not present

## 2020-12-25 ENCOUNTER — Other Ambulatory Visit: Payer: Self-pay

## 2020-12-25 ENCOUNTER — Other Ambulatory Visit: Payer: Self-pay | Admitting: Family Medicine

## 2020-12-25 ENCOUNTER — Ambulatory Visit
Admission: RE | Admit: 2020-12-25 | Discharge: 2020-12-25 | Disposition: A | Discharge status: Home / Self Care | Payer: Medicare Other | Source: Ambulatory Visit | Attending: Family Medicine | Admitting: Family Medicine

## 2020-12-25 DIAGNOSIS — Z954 Presence of other heart-valve replacement: Secondary | ICD-10-CM | POA: Diagnosis not present

## 2020-12-25 DIAGNOSIS — Z1231 Encounter for screening mammogram for malignant neoplasm of breast: Secondary | ICD-10-CM

## 2020-12-27 DIAGNOSIS — Z954 Presence of other heart-valve replacement: Secondary | ICD-10-CM | POA: Diagnosis not present

## 2020-12-28 ENCOUNTER — Telehealth: Payer: Self-pay | Admitting: Cardiology

## 2020-12-28 MED ORDER — AMOXICILLIN 500 MG PO CAPS: 2000.0000 mg | ORAL_CAPSULE | Freq: Once | ORAL | 0 refills | Status: DC | PRN

## 2020-12-28 NOTE — Telephone Encounter (Signed)
Left message on patients voicemail to please return our call.   Antibiotic has been called in for her.

## 2020-12-28 NOTE — Telephone Encounter (Signed)
Spoke to the patient just now and let her know Dr. Munley's recommendations. She verbalizes understanding.  

## 2020-12-28 NOTE — Telephone Encounter (Signed)
Pt states she is getting her teeth cleaned and while she was looking through her paperwork it stated that she would need to take a certain medicine beforehand. Her dentist would also like her to ask if it is even okay for her to get her teeth cleaned.  Please advise  Vaughan Basta number [phone]

## 2020-12-29 ENCOUNTER — Telehealth: Payer: Medicare Other

## 2021-01-02 DIAGNOSIS — D2371 Other benign neoplasm of skin of right lower limb, including hip: Secondary | ICD-10-CM | POA: Diagnosis not present

## 2021-01-02 DIAGNOSIS — I788 Other diseases of capillaries: Secondary | ICD-10-CM | POA: Diagnosis not present

## 2021-01-03 DIAGNOSIS — Z954 Presence of other heart-valve replacement: Secondary | ICD-10-CM | POA: Diagnosis not present

## 2021-01-04 ENCOUNTER — Ambulatory Visit (INDEPENDENT_AMBULATORY_CARE_PROVIDER_SITE_OTHER): Payer: Medicare Other

## 2021-01-04 DIAGNOSIS — E782 Mixed hyperlipidemia: Secondary | ICD-10-CM

## 2021-01-04 DIAGNOSIS — Z953 Presence of xenogenic heart valve: Secondary | ICD-10-CM

## 2021-01-04 DIAGNOSIS — E1142 Type 2 diabetes mellitus with diabetic polyneuropathy: Secondary | ICD-10-CM

## 2021-01-04 DIAGNOSIS — I10 Essential (primary) hypertension: Secondary | ICD-10-CM

## 2021-01-04 NOTE — Progress Notes (Signed)
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 In her free time, she enjoys going to church each weekend  Recommendations/Changes made from today's visit: -Could we try a high intensity statin if her LDL is above 70 again next visit? Her labs are fantastic but she might benefit from the high intensity protection if her LDL is above 70 again -Could we get an AWV on her. I will send a msg to nursing to get one scheduled  Plan: -Will renew Januvia PAP in december   Subjective: Megan Galvan is an 70 y.o. year old female who is a primary patient of Cox, Kirsten, MD.  The CCM team was consulted for assistance with disease management and care coordination needs.    Engaged with patient by telephone for follow up visit in response to provider referral for pharmacy case management and/or care coordination services.   Consent to Services:  The patient was given the following information about Chronic Care Management services today, agreed to services, and gave verbal consent: 1. CCM service includes personalized support from designated clinical staff supervised by the primary care provider, including individualized plan of care and coordination with other care providers 2. 24/7 contact phone numbers for assistance for urgent and routine care needs. 3. Service will only be billed when office clinical staff spend 20 minutes or more in a month to coordinate care. 4. Only one practitioner may furnish and bill the service in a calendar month. 5.The patient may stop CCM services at any time (effective at the end of the month) by phone call to the office staff. 6. The patient will be responsible for cost sharing (co-pay)  of up to 20% of the service fee (after annual deductible is met). Patient agreed to services and consent obtained.  Patient Care Team: Rochel Brome, MD as PCP - General (Family Medicine) Burnice Logan, [facility] as Pharmacist (Pharmacist) Lane Hacker, [facility] as Pharmacist (Pharmacist)     Objective:  Lab Results  Component Value Date   CREATININE 0.89 12/13/2020   BUN 16 12/13/2020   GFRNONAA >60 08/13/2020   GFRAA 84 05/09/2020   NA 139 12/13/2020   K 3.3 (L) 12/13/2020   CALCIUM 9.5 12/13/2020   CO2 26 12/13/2020   GLUCOSE 115 (H) 12/13/2020    Lab Results  Component Value Date/Time   HGBA1C 6.6 (H) 12/13/2020 10:44 AM   HGBA1C 6.5 (H) 08/08/2020 11:59 AM   MICROALBUR 10 12/13/2020 10:44 AM   MICROALBUR 10 05/09/2020 11:32 AM    Last diabetic Eye exam:  Lab Results  Component Value Date/Time   HMDIABEYEEXA No Retinopathy 03/20/2020 04:25 PM    Last diabetic Foot exam: No results found for: HMDIABFOOTEX   Lab Results  Component Value Date   CHOL 143 12/13/2020   HDL 55 12/13/2020   LDLCALC 71 12/13/2020   TRIG 89 12/13/2020   CHOLHDL 2.6 12/13/2020    Hepatic Function Latest Ref Rng & Units 12/13/2020 12/06/2020 09/28/2020  Total Protein 6.0 - 8.5 g/dL 7.4 6.9 7.2  Albumin 3.8 - 4.8 g/dL 4.5 4.3 4.5  AST 0 - 40 IU/L _0 ALT 0 - 32 IU/L 27 23  19  Alk Phosphatase 44 - 121 IU/L 68 75 70  Total Bilirubin 0.0 - 1.2 mg/dL 0.5 0.3 0.3    Lab Results  Component Value Date/Time   TSH 1.690 09/28/2020 11:43 AM    CBC Latest Ref Rng & Units 12/13/2020 12/06/2020 09/28/2020  WBC 3.4 - 10.8 x10E3/uL 6.2 5.9 6.2  Hemoglobin 11.1 - 15.9 g/dL 12.0 12.1 11.5  Hematocrit 34.0 - 46.6 % 36.7 36.5 35.1  Platelets 150 - 450 x10E3/uL 218 221 233    No results found for: VD25OH  Clinical ASCVD: Yes  The 10-year ASCVD risk score (Arnett DK, et al., 2019) is: 15.6%   Values used to calculate the score:     Age: 62 years     Sex: Female     Is Non-Hispanic African  American: No     Diabetic: Yes     Tobacco smoker: No     Systolic Blood Pressure: 160 mmHg     Is BP treated: Yes     HDL Cholesterol: 55 mg/dL     Total Cholesterol: 143 mg/dL    Depression screen [facility] 2/9 12/13/2020 09/28/2020 09/11/2020  Decreased Interest 0 0 0  Down, Depressed, Hopeless 0 0 0  PHQ - 2 Score 0 0 0  Altered sleeping - - -  Tired, decreased energy - - -  Change in appetite - - -  Feeling bad or failure about yourself  - - -  Trouble concentrating - - -  Moving slowly or fidgety/restless - - -  Suicidal thoughts - - -  PHQ-9 Score - - -  Difficult doing work/chores - - -     Other: (CHADS2VASc if Afib, MMRC or CAT for COPD, ACT, DEXA)  Social History   Tobacco Use  Smoking Status Never  Smokeless Tobacco Never   BP Readings from Last 3 Encounters:  12/13/20 116/62  12/06/20 130/72  10/24/20 138/64   Pulse Readings from Last 3 Encounters:  12/13/20 79  12/06/20 78  10/24/20 66   Wt Readings from Last 3 Encounters:  12/06/20 211 lb 3.2 oz (95.8 kg)  10/24/20 210 lb 3.2 oz (95.3 kg)  09/28/20 205 lb (93 kg)   BMI Readings from Last 3 Encounters:  12/13/20 34.09 kg/m  12/06/20 34.09 kg/m  10/24/20 33.93 kg/m    Assessment/Interventions: Review of patient past medical history, allergies, medications, health status, including review of consultants reports, laboratory and other test data, was performed as part of comprehensive evaluation and provision of chronic care management services.   SDOH:  (Social Determinants of Health) assessments and interventions performed: Yes SDOH Interventions    Flowsheet Row Most Recent Value  SDOH Interventions   Financial Strain Interventions Other (Comment)  [PAP for Januvia]      SDOH Screenings   Alcohol Screen: Low Risk    Last Alcohol Screening Score (AUDIT): 0  Depression (PHQ2-9): Low Risk    PHQ-2 Score: 0  Financial Resource Strain: High Risk   Difficulty of Paying Living Expenses: Hard  Food  Insecurity: Not on file  Housing: Not on file  Physical Activity: Not on file  Social Connections: Not on file  Stress: Not on file  Tobacco Use: Low Risk    Smoking Tobacco Use: Never   Smokeless Tobacco Use: Never  Transportation Needs: Not on file    [place]  Allergies  Allergen Reactions   Ace Inhibitors Cough   Farxiga [Dapagliflozin]     Yeast infections  Jardiance [Empagliflozin]     Yeast infections   Keflex [Cephalexin] Itching   Latex     Burn and itch   Nexium [Esomeprazole Magnesium] Diarrhea   Protonix [Pantoprazole Sodium] Other (See Comments)    Constipation   Doxycycline Rash    Medications Reviewed Today     Reviewed by Lane Hacker, [facility] (Pharmacist) on 01/04/21 at [place] List Status: <None>   Medication Order Taking? Sig Documenting Provider Last Dose Status Informant  acetaminophen (TYLENOL) 500 MG tablet [id number] Yes Take 1,000 mg by mouth every 6 (six) hours as needed for moderate pain or headache. [provider] Taking Active Self  amoxicillin (AMOXIL) 500 MG capsule [id number]  Take 4 capsules (2,000 mg total) by mouth once as needed for up to 1 dose (45 minutes prior to dental work/cleanings). Richardo Priest, MD  Active   aspirin EC 81 MG tablet [id number] Yes Take 1 tablet (81 mg total) by mouth daily. Swallow whole. Richardo Priest, MD Taking Active   Blood Glucose Monitoring Suppl (ACCU-CHEK GUIDE ME) w/Device Drucie Opitz [id number]  Use as directed Cox, Elnita Maxwell, MD  Active   Calcium Carb-Cholecalciferol (CALCIUM 600 + D PO) [id number] Yes Take 2 tablets by mouth daily. [provider] Taking Active Self  cetirizine (ZYRTEC) 10 MG tablet [id number] Yes Take 10 mg by mouth daily. [provider] Taking Active Self  chlorthalidone (HYGROTON) 50 MG tablet [id number] Yes TAKE 1 TABLET BY MOUTH  DAILY Cox, Kirsten, MD Taking Active   conjugated estrogens (PREMARIN) vaginal cream [id number] Yes Place 1 Applicatorful vaginally  2 (two) times a week. Cox, Kirsten, MD Taking Active Self  diltiazem (CARDIZEM) 30 MG tablet [id number] Yes Take 1 tablet (30 mg total) by mouth every 6 (six) hours as needed (If your heart rate is greater than 100.). Richardo Priest, MD Taking Active   famotidine (PEPCID) 40 MG tablet [id number] Yes TAKE 1 TABLET BY MOUTH AT  BEDTIME Cox, Kirsten, MD Taking Active   fluticasone [facility]) 50 MCG/ACT nasal spray [id number] No Place 1 spray into the nose daily as needed for allergies.  Patient not taking: Reported on 01/04/2021   [provider] Not Taking Active Self           Med Note Caryn Section, [place] A   Wed Jul 26, 2020  1:08 PM)    glucose blood (ACCU-CHEK GUIDE) test strip [id number] Yes Use as instructed Cox, Kirsten, MD Taking Active   hydrocortisone (ANUSOL-HC) 25 MG suppository [id number] No Place 1 suppository (25 mg total) rectally 2 (two) times daily.  Patient not taking: Reported on 01/04/2021   CoxElnita Maxwell, MD Not Taking Active   linaclotide [facility]) 145 MCG CAPS capsule [id number] No Take 1 capsule (145 mcg total) by mouth daily before breakfast.  Patient not taking: Reported on 01/04/2021   Jackquline Denmark, MD Not Taking Consider Medication Status and Discontinue   lubiprostone (AMITIZA) 24 MCG capsule [id number] Yes Take 1 capsule (24 mcg total) by mouth 2 (two) times daily with a meal. Jackquline Denmark, MD Taking Active   Melatonin 5 MG CAPS [id number]  Take 5 mg by mouth at bedtime. [provider]  Active Self  Multiple Vitamin (MULTIVITAMIN WITH MINERALS) TABS tablet [id number] Yes Take 1 tablet by mouth daily. [provider] Taking Active Self  omeprazole (PRILOSEC) 40 MG capsule [id number] Yes TAKE 1 CAPSULE BY MOUTH  TWICE DAILY BEFORE MEALS [name], PA-C Taking Active   potassium chloride SA (KLOR-CON) 20 MEQ  tablet [id number] Yes TAKE 2 TABLETS BY MOUTH IN  THE MORNING AND 1 TABLET IN THE Julio Alm, Kirsten, MD Taking Active   pravastatin (PRAVACHOL) 40 MG  tablet [id number] Yes TAKE 1 TABLET BY MOUTH AT  BEDTIME Rochel Brome, MD Taking Active   PRESCRIPTION MEDICATION [id number]  Apply 1 application topically 4 (four) times daily as needed (hemorrhoid). Nifedipine 5% ointment  Rx is sent to [place]. Last called in on 07/19/2020 with 1 yr of refills. [provider]  Active Self  sitaGLIPtin (JANUVIA) 50 MG tablet [id number] Yes Take 50 mg by mouth daily. [provider] Taking Active   valsartan (DIOVAN) 320 MG tablet [id number] Yes TAKE 1 TABLET BY MOUTH  DAILY Cox, Kirsten, MD Taking Active             Patient Active Problem List   Diagnosis Date Noted   Headache    S/P aortic valve replacement with bioprosthetic valve 08/10/2020   S/P AVR (aortic valve replacement) 08/10/2020   Sleep apnea    Plantar fasciitis    Nonrheumatic aortic (valve) stenosis    Hypertension    Hyperlipidemia    Heart murmur    GERD (gastroesophageal reflux disease)    Diabetes mellitus without complication ([place])    Cataract    Bone spur of acromioclavicular joint, left    Arthritis    Anemia    Allergy    Medicare annual wellness visit, subsequent 03/27/2020   Essential hypertension 10/01/2019   Mixed hyperlipidemia    Vertigo 07/20/2019   Myalgia, other site 06/07/2019   Class 1 obesity due to excess calories with serious comorbidity and body mass index (BMI) of 34.0 to 34.9 in adult 06/07/2019   BMI 35.0-35.9,adult 06/07/2019   Gastroesophageal reflux disease without esophagitis 06/03/2019   Type 2 diabetes mellitus with diabetic polyneuropathy ([place]) 06/03/2019   Primary insomnia 06/03/2019    Immunization History  Administered Date(s) Administered   Hepatitis B 06/22/2013, 11/16/2014, 05/26/2015   Influenza, High Dose Seasonal PF 12/29/2018   Influenza-Unspecified 01/27/2014, 01/24/2020   Moderna SARS-COV2 Booster Vaccination 02/29/2020   Moderna Sars-Covid-2 Vaccination 06/07/2019, 07/05/2019   Pneumococcal  Conjugate-13 12/29/2013   Pneumococcal Polysaccharide-23 04/13/2012   Td 11/23/2014    Conditions to be addressed/monitored:  Hypertension, Hyperlipidemia, and Diabetes  Care Plan : [place]  Updates made by Lane Hacker, [place] since 01/04/2021 12:00 AM     Problem: dm, htn, hld, gerd   Priority: High  Onset Date: 06/28/2020     Long-Range Goal: disease management   Start Date: 06/28/2020  Expected End Date: 06/28/2021  Recent Progress: On track  Priority: High  Note:    Current Barriers:  Patient working to increase exercise but dealing with shortness of breath and chest pain episodes.    Pharmacist Clinical Goal(s):  Over the next 90 days, patient will achieve control of chest pain episodes as evidenced by ability to exercise and avoid chest pain episodes.  through collaboration with PharmD and provider.   Interventions: 1:1 collaboration with Cox, Kirsten, MD regarding development and update of comprehensive plan of care as evidenced by provider attestation and co-signature Inter-disciplinary care team collaboration (see longitudinal plan of care) Comprehensive medication review performed; medication list updated in electronic medical record  Hypertension (BP goal <140/90) -Controlled -Current treatment: chlorthalidone 50 mg am and hs Metoprolol succinate 50 mg daily  Valsartan 320 mg daily  -Medications previously tried: ace inhibitors  -Current home readings:   01/04/21:  Readings from past few days Checks Daily AM:  148/81 (Ate a lot of salty pasta) 127/69 125/78 133/68 -Current dietary habits: trying to eat healthier for diabetes control.  -Current exercise habits: working to increase and start working more  -Reports hypotensive/hypertensive symptoms -Educated on BP goals and benefits of medications for prevention of heart attack, stroke and kidney damage; Daily salt intake goal < 2300 mg; Exercise goal of 150 minutes per week; Importance of home  blood pressure monitoring; -Counseled to monitor BP at home daily, document, and provide log at future appointments -Counseled on diet and exercise extensively Recommended to continue current medication September 2022: Extensive time spent on counseling patient on blood pressure goal and impact of each antihypertensive medication on their blood pressure and risk reduction for CV disease.  Used analogies to explain the need for multiple antihypertensive medications to achieve BP goals and that it is often a silent disease with no symptoms.    Hyperlipidemia: (LDL goal < 70) -Controlled -Current treatment: Pravastatin 40 mg daily at bedtime -Medications previously tried: none reported  -Current dietary patterns: healthier diet and working to increase protein in diet -Current exercise habits: working to increase activity.  -Educated on Cholesterol goals;  Benefits of statin for ASCVD risk reduction; Importance of limiting foods high in cholesterol; Exercise goal of 150 minutes per week; -Counseled on diet and exercise extensively September 2022: Patient open to trying high intensity statin  Diabetes (A1c goal <7%) -Controlled -Current medications: accu-chek softclix daily aviva plus test strips daily  Sitagliptin (PAP, yearly in December) -Medications previously tried: metformin  -Current home glucose readings fasting glucose: ~120-150 -Denies hypoglycemic/hyperglycemic symptoms -Current meal patterns:  breakfast: working to get protein and control sugar with options - eggs   Lunch and dinner: loves spaghetti and trying to limit eating it -Current exercise: trying to exercise some -  having some shortness of breath or chest pain being evaluated by cardiologist.  -Educated onA1c and blood sugar goals; Complications of diabetes including kidney damage, retinal damage, and cardiovascular disease; Exercise goal of 150 minutes per week; Benefits of routine self-monitoring of blood  sugar; Carbohydrate counting and/or plate method -Counseled to check feet daily and get yearly eye exams -Counseled on diet and exercise extensively September 2022: Will schedule f/u for December to start PAP procee\ss  GERD  (Goal: control acid reflux symptoms ) -Controlled -Current treatment  famotidine 40 mg daily at bedtime  Omeprazole 40 mg daily -Medications previously tried: none reported -Recommended to continue current medication Counseled on triggers to flares. Patient is having some chest pain symptoms and being seen by cardiology this week. Doesn't feel that symptoms are related to acid reflux.   Constipation -Controlled -Current treatment  Amitiza 20mcg -Medications previously tried: Linzess  - September 2022: Patient asked if she has to take this the rest of her life. I told her that some people do and some don't, the only way to know is to try. She has f/u with Dr. Lyndel Safe regarding Constipation next month, she will ask doctor about doing a trial without at some point in the future when she's ready     Patient Goals/Self-Care Activities Over the next 180 days, patient will:  - take medications as prescribed focus on medication adherence by using pill box check glucose daily, document, and provide at future appointments check blood pressure daily, document, and provide at future appointments target a minimum of 150 minutes of moderate intensity exercise weekly  Follow Up Plan: Telephone follow up appointment  with care management team member scheduled for: 12/2020      Medication Assistance:  Januvia obtained through PAP medication assistance program.  Enrollment ends December  Compliance/Adherence/Medication fill history: Care Gaps: -Needs AWV  Star-Rating Drugs: Januvia Pravastatin Valsartan Diltiazem Chlorthalidone  Patient's preferred pharmacy is:  [facility], [place] - [address] AT [address] [place] [address] Phone: [phone] Fax: [phone]  [place], [place] [address] Phone: [phone] Fax: [phone]  [facility]  ([place], [place] [facility] [place] [address] [place] [address] Phone: [phone] Fax: [phone]  Uses pill box? Yes Pt endorses 100% compliance  We discussed: Benefits of medication synchronization, packaging and delivery as well as enhanced pharmacist oversight with [employer]. Patient decided to:  Will go over [employer] at future visit  Care Plan and Follow Up Patient Decision:  Patient agrees to Care Plan and Follow-up.  Plan: The care management team will reach out to the patient again over the next 90 days.  Care Plan : [place]  Updates made by Lane Hacker, RPH since 01/04/2021 12:00 AM     Problem: dm, htn, hld, gerd   Priority: High  Onset Date: 06/28/2020     Long-Range Goal: disease management   Start Date: 06/28/2020  Expected End Date: 06/28/2021  Recent Progress: On track  Priority: High  Note:    Current Barriers:  Patient working to increase exercise but dealing with shortness of breath and chest pain episodes.    Pharmacist Clinical Goal(s):  Over the next 90 days, patient will achieve control of chest pain episodes as evidenced by ability to exercise and avoid chest pain episodes.  through collaboration with PharmD and provider.   Interventions: 1:1 collaboration with Cox, Elnita Maxwell, MD regarding development and update of comprehensive plan of care as evidenced by provider attestation and co-signature Inter-disciplinary care team collaboration (see longitudinal plan of care) Comprehensive medication review performed; medication list updated in electronic medical record  Hypertension (BP goal <140/90) -Controlled -Current treatment: chlorthalidone 50 mg am and  hs Metoprolol succinate 50 mg daily  Valsartan 320 mg daily  -Medications previously tried: ace inhibitors  -Current home readings:   01/04/21: Readings from past few days Checks Daily AM:  148/81 (Ate a lot of salty pasta) 127/69 125/78 133/68 -Current dietary habits: trying to eat healthier for diabetes control.  -Current exercise habits: working to increase and start working more  -Reports hypotensive/hypertensive symptoms -Educated on BP goals and benefits of medications for prevention of heart attack, stroke and kidney damage; Daily salt intake goal < 2300 mg; Exercise goal of 150 minutes per week; Importance of home blood pressure monitoring; -Counseled to monitor BP at home daily, document, and provide log at future appointments -Counseled on diet and exercise extensively Recommended to continue current medication September 2022: Extensive time spent on counseling patient on blood pressure goal and impact of each antihypertensive medication on their blood pressure and risk reduction for CV disease.  Used analogies to explain the need for multiple antihypertensive medications to achieve BP goals and that it is often a silent disease with no symptoms.    Hyperlipidemia: (LDL goal < 70) -Controlled -Current treatment: Pravastatin 40 mg daily at bedtime -Medications previously tried: none reported  -Current dietary patterns: healthier diet and working  to increase protein in diet -Current exercise habits: working to increase activity.  -Educated on Cholesterol goals;  Benefits of statin for ASCVD risk reduction; Importance of limiting foods high in cholesterol; Exercise goal of 150 minutes per week; -Counseled on diet and exercise extensively September 2022: Patient open to trying high intensity statin  Diabetes (A1c goal <7%) -Controlled -Current medications: accu-chek softclix daily aviva plus test strips daily  Sitagliptin (PAP, yearly in December) -Medications  previously tried: metformin  -Current home glucose readings fasting glucose: ~120-150 -Denies hypoglycemic/hyperglycemic symptoms -Current meal patterns:  breakfast: working to get protein and control sugar with options - eggs   Lunch and dinner: loves spaghetti and trying to limit eating it -Current exercise: trying to exercise some -  having some shortness of breath or chest pain being evaluated by cardiologist.  -Educated onA1c and blood sugar goals; Complications of diabetes including kidney damage, retinal damage, and cardiovascular disease; Exercise goal of 150 minutes per week; Benefits of routine self-monitoring of blood sugar; Carbohydrate counting and/or plate method -Counseled to check feet daily and get yearly eye exams -Counseled on diet and exercise extensively September 2022: Will schedule f/u for December to start PAP procee\ss  GERD  (Goal: control acid reflux symptoms ) -Controlled -Current treatment  famotidine 40 mg daily at bedtime  Omeprazole 40 mg daily -Medications previously tried: none reported -Recommended to continue current medication Counseled on triggers to flares. Patient is having some chest pain symptoms and being seen by cardiology this week. Doesn't feel that symptoms are related to acid reflux.   Constipation -Controlled -Current treatment  Amitiza 4mg -Medications previously tried: Linzess  - September 2022: Patient asked if she has to take this the rest of her life. I told her that some people do and some don't, the only way to know is to try. She has f/u with Dr. GLyndel Saferegarding Constipation next month, she will ask doctor about doing a trial without at some point in the future when she's ready     Patient Goals/Self-Care Activities Over the next 180 days, patient will:  - take medications as prescribed focus on medication adherence by using pill box check glucose daily, document, and provide at future appointments check blood  pressure daily, document, and provide at future appointments target a minimum of 150 minutes of moderate intensity exercise weekly  Follow Up Plan: Telephone follow up appointment with care management team member scheduled for: 12/2020

## 2021-01-04 NOTE — Patient Instructions (Signed)
Visit Information   Goals Addressed             This Visit's Progress    Manage My Medicine       Timeframe:  Long-Range Goal Priority:  High Start Date:     06/28/20                       Expected End Date:         06/28/2021              Follow Up Date 12/29/2020    - call for medicine refill 2 or 3 days before it runs out - keep a list of all the medicines I take; vitamins and herbals too - use a pillbox to sort medicine  -Can't afford meds (Januvia)   Why is this important?   These steps will help you keep on track with your medicines.   Notes: Pharmacist will complete PAP yearly in December       Patient Care Plan: [facility]     Problem Identified: dm, htn, hld, gerd   Priority: High  Onset Date: 06/28/2020     Long-Range Goal: disease management   Start Date: 06/28/2020  Expected End Date: 06/28/2021  Recent Progress: On track  Priority: High  Note:    Current Barriers:  Patient working to increase exercise but dealing with shortness of breath and chest pain episodes.    Pharmacist Clinical Goal(s):  Over the next 90 days, patient will achieve control of chest pain episodes as evidenced by ability to exercise and avoid chest pain episodes.  through collaboration with PharmD and provider.   Interventions: 1:1 collaboration with Cox, Elnita Maxwell, MD regarding development and update of comprehensive plan of care as evidenced by provider attestation and co-signature Inter-disciplinary care team collaboration (see longitudinal plan of care) Comprehensive medication review performed; medication list updated in electronic medical record  Hypertension (BP goal <140/90) -Controlled -Current treatment: chlorthalidone 50 mg am and hs Metoprolol succinate 50 mg daily  Valsartan 320 mg daily  -Medications previously tried: ace inhibitors  -Current home readings:   01/04/21: Readings from past few days Checks Daily AM:  148/81 (Ate a lot of salty  pasta) 127/69 125/78 133/68 -Current dietary habits: trying to eat healthier for diabetes control.  -Current exercise habits: working to increase and start working more  -Reports hypotensive/hypertensive symptoms -Educated on BP goals and benefits of medications for prevention of heart attack, stroke and kidney damage; Daily salt intake goal < 2300 mg; Exercise goal of 150 minutes per week; Importance of home blood pressure monitoring; -Counseled to monitor BP at home daily, document, and provide log at future appointments -Counseled on diet and exercise extensively Recommended to continue current medication September 2022: Extensive time spent on counseling patient on blood pressure goal and impact of each antihypertensive medication on their blood pressure and risk reduction for CV disease.  Used analogies to explain the need for multiple antihypertensive medications to achieve BP goals and that it is often a silent disease with no symptoms.    Hyperlipidemia: (LDL goal < 70) -Controlled -Current treatment: Pravastatin 40 mg daily at bedtime -Medications previously tried: none reported  -Current dietary patterns: healthier diet and working to increase protein in diet -Current exercise habits: working to increase activity.  -Educated on Cholesterol goals;  Benefits of statin for ASCVD risk reduction; Importance of limiting foods high in cholesterol; Exercise goal of 150 minutes per week; -Counseled  on diet and exercise extensively September 2022: Patient open to trying high intensity statin  Diabetes (A1c goal <7%) -Controlled -Current medications: accu-chek softclix daily aviva plus test strips daily  Sitagliptin (PAP, yearly in December) -Medications previously tried: metformin  -Current home glucose readings fasting glucose: ~120-150 -Denies hypoglycemic/hyperglycemic symptoms -Current meal patterns:  breakfast: working to get protein and control sugar with options -  eggs   Lunch and dinner: loves spaghetti and trying to limit eating it -Current exercise: trying to exercise some -  having some shortness of breath or chest pain being evaluated by cardiologist.  -Educated onA1c and blood sugar goals; Complications of diabetes including kidney damage, retinal damage, and cardiovascular disease; Exercise goal of 150 minutes per week; Benefits of routine self-monitoring of blood sugar; Carbohydrate counting and/or plate method -Counseled to check feet daily and get yearly eye exams -Counseled on diet and exercise extensively September 2022: Will schedule f/u for December to start PAP procee\ss  GERD  (Goal: control acid reflux symptoms ) -Controlled -Current treatment  famotidine 40 mg daily at bedtime  Omeprazole 40 mg daily -Medications previously tried: none reported -Recommended to continue current medication Counseled on triggers to flares. Patient is having some chest pain symptoms and being seen by cardiology this week. Doesn't feel that symptoms are related to acid reflux.   Constipation -Controlled -Current treatment  Amitiza 43mg -Medications previously tried: Linzess  - September 2022: Patient asked if she has to take this the rest of her life. I told her that some people do and some don't, the only way to know is to try. She has f/u with Dr. GLyndel Saferegarding Constipation next month, she will ask doctor about doing a trial without at some point in the future when she's ready     Patient Goals/Self-Care Activities Over the next 180 days, patient will:  - take medications as prescribed focus on medication adherence by using pill box check glucose daily, document, and provide at future appointments check blood pressure daily, document, and provide at future appointments target a minimum of 150 minutes of moderate intensity exercise weekly  Follow Up Plan: Telephone follow up appointment with care management team member scheduled for:  12/2020      The patient verbalized understanding of instructions, educational materials, and care plan provided today and declined offer to receive copy of patient instructions, educational materials, and care plan.  The pharmacy team will reach out to the patient again over the next 90 days.   NLane Hacker [facility]

## 2021-01-05 DIAGNOSIS — Z954 Presence of other heart-valve replacement: Secondary | ICD-10-CM | POA: Diagnosis not present

## 2021-01-08 ENCOUNTER — Telehealth: Payer: Self-pay | Admitting: Cardiology

## 2021-01-08 NOTE — Telephone Encounter (Signed)
Left message on patients voicemail to please return our call.   

## 2021-01-08 NOTE — Telephone Encounter (Signed)
Pt c/o of Chest Pain: STAT if CP now or developed within 24 hours  1. Are you having CP right now?  No   2. Are you experiencing any other symptoms (ex. SOB, nausea, vomiting, sweating)?  No, but the pain radiates to her back  3. How long have you been experiencing CP?  Patient states the CP developed on 01/06/21.  4. Is your CP continuous or coming and going?  Coming and going   5. Have you taken Nitroglycerin?  No, patient states she does not have any nitro ?

## 2021-01-08 NOTE — Telephone Encounter (Signed)
Spoke to the patient just now and she let me know that she had pain that started in her neck/teeth/back. She tells me that it lasted for about 5 minutes and went away on its own. She tells me that she could not talk and felt like she was paralyzed. After this she felt very tired for the rest of the day.   She tells me now that she has been having pain in her chest as well as in her back. She states that it has happened a couple of times today and will last a couple of minutes before it goes away on its own.   She is at the podiatrist right now and can not take her vital signs.   I spoke with Dr. Bettina Gavia about this directly and he looked over her chart at this time. He advised that she see her PCP in regards to this as he does not believe these symptoms are from her heart. I told her these recommendations and she states that she will do so.

## 2021-01-08 NOTE — Telephone Encounter (Signed)
Patient returning call. She states to call her husband's cell number, because her's is almost dead. Phone: [phone]

## 2021-01-09 NOTE — Progress Notes (Signed)
Dr. [name] asked me to look into what meds she has used for Hemorids in the past. Called patient and left msg with her husband to call me back. Per Meds tab and going back as far as possible (2019)  Nifedipine 5% ointment Anusol 2.5% Diltiazem 2% gel Proctozone 2.5%

## 2021-01-12 DIAGNOSIS — Z954 Presence of other heart-valve replacement: Secondary | ICD-10-CM | POA: Diagnosis not present

## 2021-01-15 DIAGNOSIS — Z954 Presence of other heart-valve replacement: Secondary | ICD-10-CM | POA: Diagnosis not present

## 2021-01-17 DIAGNOSIS — Z954 Presence of other heart-valve replacement: Secondary | ICD-10-CM | POA: Diagnosis not present

## 2021-01-19 DIAGNOSIS — Z954 Presence of other heart-valve replacement: Secondary | ICD-10-CM | POA: Diagnosis not present

## 2021-01-22 DIAGNOSIS — Z954 Presence of other heart-valve replacement: Secondary | ICD-10-CM | POA: Diagnosis not present

## 2021-01-24 DIAGNOSIS — Z954 Presence of other heart-valve replacement: Secondary | ICD-10-CM | POA: Diagnosis not present

## 2021-01-26 DIAGNOSIS — E782 Mixed hyperlipidemia: Secondary | ICD-10-CM | POA: Diagnosis not present

## 2021-01-26 DIAGNOSIS — Z954 Presence of other heart-valve replacement: Secondary | ICD-10-CM | POA: Diagnosis not present

## 2021-01-26 DIAGNOSIS — I10 Essential (primary) hypertension: Secondary | ICD-10-CM

## 2021-01-26 DIAGNOSIS — E1142 Type 2 diabetes mellitus with diabetic polyneuropathy: Secondary | ICD-10-CM | POA: Diagnosis not present

## 2021-01-26 DIAGNOSIS — R300 Dysuria: Secondary | ICD-10-CM | POA: Diagnosis not present

## 2021-01-29 ENCOUNTER — Encounter: Payer: Self-pay | Admitting: Gastroenterology

## 2021-01-29 ENCOUNTER — Ambulatory Visit (INDEPENDENT_AMBULATORY_CARE_PROVIDER_SITE_OTHER): Payer: Medicare Other | Admitting: Gastroenterology

## 2021-01-29 ENCOUNTER — Other Ambulatory Visit: Payer: Self-pay

## 2021-01-29 VITALS — BP 138/78 | HR 67 | Ht 66.0 in | Wt 218.0 lb

## 2021-01-29 DIAGNOSIS — K581 Irritable bowel syndrome with constipation: Secondary | ICD-10-CM

## 2021-01-29 DIAGNOSIS — K649 Unspecified hemorrhoids: Secondary | ICD-10-CM

## 2021-01-29 MED ORDER — FLUTICASONE PROPIONATE 0.05 % EX CREA: TOPICAL_CREAM | Freq: Two times a day (BID) | CUTANEOUS | 2 refills | Status: DC

## 2021-01-29 MED ORDER — LINACLOTIDE 145 MCG PO CAPS: 145.0000 ug | ORAL_CAPSULE | Freq: Every day | ORAL | 5 refills | Status: DC

## 2021-01-29 NOTE — Patient Instructions (Signed)
If you are age 69 or older, your body mass index should be between 23-30. Your Body mass index is 35.19 kg/m. If this is out of the aforementioned range listed, please consider follow up with your Primary Care Provider.  If you are age 44 or younger, your body mass index should be between 19-25. Your Body mass index is 35.19 kg/m. If this is out of the aformentioned range listed, please consider follow up with your Primary Care Provider.   __________________________________________________________  The [facility] providers would like to encourage you to use [facility] to communicate with providers for non-urgent requests or questions.  Due to long hold times on the telephone, sending your provider a message by [facility] may be a faster and more efficient way to get a response.  Please allow 48 business hours for a response.  Please remember that this is for non-urgent requests.   We have sent the following medications to your pharmacy for you to pick up at your convenience: Linzess 175mcg Fluticasone cream  Please purchase the following medications over the counter and take as directed: Benefiber 1 tablespoon in 8oz water daily  Sitz baths 2 times a day for 7 days   Please call in 2 weeks and speak to the nurse to give [place] an update.   How to Take a [employer] A sitz bath is a warm water bath that may be used to care for your rectum, genital area, or the area between your rectum and genitals (perineum). In a sitz bath, the water only comes up to your hips and covers your buttocks. A sitz bath may be done in a bathtub or with a portable sitz bath that fits over the toilet. Your health care provider may recommend a sitz bath to help: Relieve pain and discomfort after delivering a baby. Relieve pain and itching from hemorrhoids or anal fissures. Relieve pain after certain surgeries. Relax muscles that are sore or tight. How to take a sitz bath Take 3-4 sitz baths a day, or as many as told by  your health care provider. Bathtub sitz bath To take a sitz bath in a bathtub: Partially fill a bathtub with warm water. The water should be deep enough to cover your hips and buttocks when you are sitting in the tub. Follow your health care provider's instructions if you are told to put medicine in the water. Sit in the water. Open the tub drain a little, and leave it open during your bath. Turn on the warm water again, enough to replace the water that is draining out. Keep the water running throughout your bath. This helps keep the water at the right level and temperature. Soak in the water for 15-20 minutes, or as long as told by your health care provider. When you are done, be careful when you stand up. You may feel dizzy. After the sitz bath, pat yourself dry. Do not rub your skin to dry it.  Over-the-toilet sitz bath To take a sitz bath with an over-the-toilet basin: Follow the manufacturer's instructions. Fill the basin with warm water. Follow your health care provider's instructions if you were told to put medicine in the water. Sit on the seat. Make sure the water covers your buttocks and perineum. Soak in the water for 15-20 minutes, or as long as told by your health care provider. After the sitz bath, pat yourself dry. Do not rub your skin to dry it. Clean and dry the basin between uses. Discard the basin  if it cracks, or according to the manufacturer's instructions.  Contact a health care provider if: Your pain or itching gets worse. Do not continue with sitz baths if your symptoms get worse. You have new symptoms. Do not continue with sitz baths until you talk with your health care provider. Summary A sitz bath is a warm water bath in which the water only comes up to your hips and covers your buttocks. A sitz bath may help relieve pain and discomfort after delivering a baby. It also may help with pain and itching from hemorrhoids or anal fissures, or pain after certain  surgeries. It can also help to relax muscles that are sore or tight. Take 3-4 sitz baths a day, or as many as told by your health care provider. Soak in the water for 15-20 minutes. Do not continue with sitz baths if your symptoms get worse. This information is not intended to replace advice given to you by your health care provider. Make sure you discuss any questions you have with your health care provider. Document Revised: 12/30/2019 Document Reviewed: 12/30/2019 Elsevier Patient Education  2022 [employer].

## 2021-01-29 NOTE — Progress Notes (Signed)
IMPRESSION and PLAN:    #1.  Rectal discomfort likely d/t int Hoids.  I did not appreciate a fissure today.  #2. IBS-C  #3. H/O polyps- colon 01/2018. Next due 01/2023   Plan:  -Linzess 145 mcg po QD #30, 6 refills -Benefiber 1 TBS p.o. QD with 8 oz of water. -fluticasone cream 0.05% generic 30g 1 bid PR x 10 days, 2 refills -Sitz baths BID x 7 days. -Avoid NSAIDs. -Call if still with problems in 2 weeks.  If still with problems, diltiazem cream/NTG ointment.        HPI:    Chief Complaint:   Megan Galvan is a 70 y.o. female  With AS s/p AVR 07/2020 with post-op Afib DM2, HLD, HTN, OSA, obesity For follow-up visit  Back to having constipation.  This is under good control with Linzess 145 mcg p.o. QD.  She has stopped taking MiraLAX.  She had failed Amitiza in the past.   Complaining of rectal discomfort and "hemorrhoidal problems" every time she has a bowel movement.  No melena or hematochezia.  She is s/p hemorrhoidectomy by Dr. 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  She was treated briefly with diltiazem cream.  Denies having any upper GI symptoms including nausea, vomiting, heartburn, regurgitation, odynophagia or dysphagia.  No abdominal pain.  From cardiology standpoint she follows up with Dr. Bettina Gavia.   Past GI procedures:  EGD 11/2019; -Minimal gastritis  Colonoscopy 01/2018 -Colonic polyp status post polypectomy -Predominantly left colonic diverticulosis. -Non-bleeding internal hemorrhoids. -Otherwise normal colonoscopy. -Next due next 01/2023  Hemorrhoidectomy 02/2019 by Dr. Amalia Hailey -Chronic anal fissure and hemorrhoids  CTA 07/2020 1. Vascular findings and measurements pertinent to potential TAVR procedure, as detailed above. 2. Severe thickening calcification of the aortic valve, compatible with reported clinical history of severe aortic stenosis. 3.  Aortic Atherosclerosis (ICD10-I70.0). 4. 4 mm  nonobstructive calculus upper pole collecting system of left kidney. 5. Colonic diverticulosis without evidence of acute diverticulitis at this time  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   21 mm Edwards Resilia Inspiria Bioprosthetic Tissue Valve  SN [id number] Model [id number]   Sleep apnea    cpap    Past Medical History:  Diagnosis Date   Arthritis    Bone spur of acromioclavicular joint, left    GERD (gastroesophageal reflux disease)    Hyperlipidemia    Hypertension    Plantar fasciitis     Current Outpatient Medications  Medication Sig Dispense Refill   Acetaminophen 500 MG coapsule 1 capsule as needed.     Azilsartan Medoxomil 40 MG TABS Take 2 tablets by mouth daily.     CALCIUM CITRATE-VITAMIN D PO Take 1 tablet by mouth daily.     cetirizine (ZYRTEC) 10 MG tablet Take 1 tablet by mouth daily.     chlorthalidone (HYGROTON) 25 MG tablet Take 1 tablet by mouth daily.     fluticasone (FLONASE) 50 MCG/ACT nasal spray Place 1 spray into the nose as needed.  lansoprazole (PREVACID) 30 MG capsule Take 1 capsule by mouth 2 (two) times daily.     Melatonin 5 MG TABS Take 1 tablet by mouth at bedtime.     metFORMIN (GLUCOPHAGE) 1000 MG tablet Take 2 tablets by mouth.     metoprolol succinate (TOPROL-XL) 50 MG 24 hr tablet Take 1 tablet by mouth daily.     Multiple Vitamin (MULTI-VITAMINS) TABS Take 1 tablet by mouth daily.     potassium chloride SA (K-DUR,KLOR-CON) 20 MEQ tablet Take 3 tablets by mouth daily.     pravastatin (PRAVACHOL) 40 MG tablet Take 1 tablet by mouth at bedtime.     PROCTOZONE-HC 2.5  % rectal cream APPLY A THIN LAYER TO THE AFFECTED AREA RECTALLY TWICE DAILY  0   ranitidine (ZANTAC) 300 MG tablet Take 1 tablet by mouth at bedtime.     traZODone (DESYREL) 100 MG tablet Take 1 tablet by mouth at bedtime.     No current facility-administered medications for this visit.     Past Surgical History:  Procedure Laterality Date   BACK SURGERY  11/17/2016   L3-L5   BILATERAL CARPAL TUNNEL RELEASE     bone spur Left    left shoulder   bone spur Right    Right shoulder   COLONOSCOPY  08/22/2011   Moderate predominantly sigmoid diverticulosis. Small internal hemorrhoids.    NECK SURGERY     NECK SURGERY     torn rotator cuff Right    Right shoulder   torn rotator cuff Left    left shoulder   TRIGGER FINGER RELEASE Left    thumb   TRIGGER FINGER RELEASE Right    thumb and middle finger   WRIST SURGERY Left    torn ligament   WRIST SURGERY Right    cyst    Family History  Problem Relation Age of Onset   Colon cancer Paternal Grandmother     Social History   Tobacco Use   Smoking status: Never Smoker   Smokeless tobacco: Never Used  Substance Use Topics   Alcohol use: Never    Frequency: Never   Drug use: Never    Allergies  Allergen Reactions   Doxycycline     Rash    Latex     Burn and itch     Review of Systems: All systems reviewed and negative except where noted in HPI.    Physical Exam:     BP 128/72   Pulse 75   Ht 5\' 6"  (1.676 m)   Wt 210 lb 4 oz (95.4 kg)   BMI 33.94 kg/m  GENERAL:  Alert, oriented, cooperative, not in acute distress. PSYCH: :Pleasant, normal mood and affect. ABDOMEN: Inspection: No visible peristalsis, no abnormal pulsations, skin normal.  Palpation/percussion: Soft, nontender, nondistended, no rigidity, no abnormal dullness to percussion, no hepatosplenomegaly and no palpable abdominal masses.  Auscultation: Normal bowel sounds, no abdominal bruits. Rectal exam: In presence of [place], no obvious fissures or  hemorrhoids.  Nontender, mild perianal excoriation.  Stool brown heme-negative.  Pt's husband was present throughout the history taking and exam.    Salim Forero,MD   [facility], Elnita Maxwell, MD

## 2021-01-31 DIAGNOSIS — Z954 Presence of other heart-valve replacement: Secondary | ICD-10-CM | POA: Diagnosis not present

## 2021-02-02 DIAGNOSIS — Z954 Presence of other heart-valve replacement: Secondary | ICD-10-CM | POA: Diagnosis not present

## 2021-02-04 NOTE — Progress Notes (Signed)
Cardiology Office Note:    Date:  02/05/2021   ID:  Megan Galvan, DOB [birth year], MRN [id number]  PCP:  Rochel Brome, MD  Cardiologist:  Shirlee More, MD    Referring MD: Rochel Brome, MD    ASSESSMENT:    1. Essential hypertension   2. S/P aortic valve replacement with bioprosthetic valve   3. Paroxysmal atrial fibrillation (HCC)   4. Mixed hyperlipidemia    PLAN:    In order of problems listed above:  I am unsure about blood pressure she is using the first morning readings will reduce as opposed to giving another antihypertensive agent is to continue her current treatment thiazide diuretic ARB and trend midday blood pressures for 2 weeks and leave a list to my office if additional therapy is needed I will place her on amlodipine. Stable markedly improved continue and complete cardiac rehab and continue regular activity program after months Stable no recurrence off amiodarone not anticoagulated stable continue statin, recent lipids 12/14/2018 was an LDL of 71 cholesterol 123 A1c 6.6%.    Next appointment: 6 months   Medication Adjustments/Labs and Tests Ordered: Current medicines are reviewed at length with the patient today.  Concerns regarding medicines are outlined above.  No orders of the defined types were placed in this encounter.  No orders of the defined types were placed in this encounter.   Chief Complaint  Patient presents with   Follow-up    After AVR complicated by atrial fibrillation   Atrial Fibrillation    History of Present Illness:    Megan Galvan is a 70 y.o. female with a hx of symptomatic severe aortic stenosis with surgical AVR 18/29/9371 complicated by postoperative atrial fibrillation with amiodarone therapy anticoagulation and hypertension last seen by me 10/24/2020 maintaining sinus rhythm on amiodarone discontinued and referred to cardiac rehabilitation..  Compliance with diet, lifestyle and medications: Yes  She continues in  cardiac rehabilitation and generalized in process and steadily improved and is recovered from bypass surgery She tells me her morning blood pressures are 1 40-1 50 when she leaves cardiac rehab normal range of 120. Same numbers when she trends at home and I Georgina Peer have her trend midday rest blood pressure is labile listed in 2 weeks and decide if she needs additional antihypertensive therapy. She has had no recurrent atrial fibrillation palpitation chest pain edema or syncope. I reviewed her cardiac rehab records last week 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   21 mm Edwards Resilia Inspiria Bioprosthetic Tissue Valve  SN [id number] Model [id number]   Sleep apnea    cpap    Past Surgical History:  Procedure Laterality Date   ANTERIOR CERVICAL DECOMP/DISCECTOMY FUSION     x 2   AORTIC VALVE REPLACEMENT N/A 08/10/2020   Procedure: AORTIC VALVE REPLACEMENT (AVR) USING INSPIRIS VALVE SIZE 21MM;  Surgeon: Rexene Alberts, MD;  Location: [facility];  Service: Open Heart Surgery;  Laterality: N/A;   BACK SURGERY  11/17/2016   L3-L5   BILATERAL CARPAL TUNNEL RELEASE  bone spur Left    left shoulder   bone spur Right    Right shoulder   COLONOSCOPY  08/22/2011   Moderate predominantly sigmoid diverticulosis. Small internal hemorrhoids.    COLONOSCOPY     HEMORROIDECTOMY  02/2019   NECK SURGERY     RIGHT/LEFT HEART CATH AND CORONARY ANGIOGRAPHY N/A 07/27/2020   Procedure: RIGHT/LEFT HEART CATH AND CORONARY ANGIOGRAPHY;  Surgeon: Sherren Mocha, MD;  Location: [facility];  Service: Cardiovascular;  Laterality: N/A;    TEE WITHOUT CARDIOVERSION N/A 08/10/2020   Procedure: TRANSESOPHAGEAL ECHOCARDIOGRAM (TEE);  Surgeon: Rexene Alberts, MD;  Location: [facility];  Service: Open Heart Surgery;  Laterality: N/A;   torn rotator cuff Right    Right shoulder   torn rotator cuff Left    left shoulder   TRIGGER FINGER RELEASE Left    thumb   TRIGGER FINGER RELEASE Right    thumb and middle finger   UPPER GASTROINTESTINAL ENDOSCOPY     WRIST SURGERY Left    torn ligament   WRIST SURGERY Right    cyst    Current Medications: Current Meds  Medication Sig   acetaminophen (TYLENOL) 500 MG tablet Take 1,000 mg by mouth every 6 (six) hours as needed for moderate pain or headache.   aspirin EC 81 MG tablet Take 1 tablet (81 mg total) by mouth daily. Swallow whole.   Blood Glucose Monitoring Suppl (ACCU-CHEK GUIDE ME) w/Device KIT Use as directed   Calcium Carb-Cholecalciferol (CALCIUM 600 + D PO) Take 2 tablets by mouth daily.   cetirizine (ZYRTEC) 10 MG tablet Take 10 mg by mouth daily.   chlorthalidone (HYGROTON) 50 MG tablet TAKE 1 TABLET BY MOUTH  DAILY   conjugated estrogens (PREMARIN) vaginal cream Place 1 Applicatorful vaginally 2 (two) times a week.   diltiazem (CARDIZEM) 30 MG tablet Take 1 tablet (30 mg total) by mouth every 6 (six) hours as needed (If your heart rate is greater than 100.).   famotidine (PEPCID) 40 MG tablet TAKE 1 TABLET BY MOUTH AT  BEDTIME   fluticasone (CUTIVATE) 0.05 % cream Apply topically 2 (two) times daily. For 10 days to rectum   fluticasone (FLONASE) 50 MCG/ACT nasal spray Place 1 spray into the nose daily as needed for allergies.   glucose blood (ACCU-CHEK GUIDE) test strip Use as instructed   hydrocortisone (ANUSOL-HC) 25 MG suppository Place 1 suppository (25 mg total) rectally 2 (two) times daily.   linaclotide (LINZESS) 145 MCG CAPS capsule Take 1 capsule (145 mcg total) by mouth daily before breakfast.   Melatonin 5 MG CAPS Take 5 mg by mouth at bedtime.   Multiple Vitamin  (MULTIVITAMIN WITH MINERALS) TABS tablet Take 1 tablet by mouth daily.   omeprazole (PRILOSEC) 40 MG capsule TAKE 1 CAPSULE BY MOUTH  TWICE DAILY BEFORE MEALS   potassium chloride SA (KLOR-CON) 20 MEQ tablet TAKE 2 TABLETS BY MOUTH IN  THE MORNING AND 1 TABLET IN THE EVENING (Patient taking differently: TAKE 2 TABLETS BY MOUTH IN  THE MORNING AND  2 TABLETS  IN THE EVENING)   pravastatin (PRAVACHOL) 40 MG tablet TAKE 1 TABLET BY MOUTH AT  BEDTIME   PRESCRIPTION MEDICATION Apply 1 application topically 4 (four) times daily as needed (hemorrhoid). Nifedipine 5% ointment  Rx is sent to [facility]. Last called in on 07/19/2020 with 1 yr of refills.   sitaGLIPtin (JANUVIA) 50 MG tablet Take 50 mg by mouth daily.   valsartan (DIOVAN) 320 MG  tablet TAKE 1 TABLET BY MOUTH  DAILY     Allergies:   Ace inhibitors, Farxiga [dapagliflozin], Jardiance [empagliflozin], Keflex [cephalexin], Latex, Nexium [esomeprazole magnesium], Protonix [pantoprazole sodium], and Doxycycline   Social History   Socioeconomic History   Marital status: Married    Spouse name: Not on file   Number of children: 2   Years of education: Not on file   Highest education level: Not on file  Occupational History   Not on file  Tobacco Use   Smoking status: Never   Smokeless tobacco: Never  Vaping Use   Vaping Use: Never used  Substance and Sexual Activity   Alcohol use: Never   Drug use: Never   Sexual activity: Not on file  Other Topics Concern   Not on file  Social History Narrative   Not on file   Social Determinants of Health   Financial Resource Strain: High Risk   Difficulty of Paying Living Expenses: Hard  Food Insecurity: Not on file  Transportation Needs: Not on file  Physical Activity: Not on file  Stress: Not on file  Social Connections: Not on file     Family History: The patient's family history includes Colon cancer in her paternal grandmother. There is no history of Esophageal cancer,  Rectal cancer, Stomach cancer, or Breast cancer. ROS:   Please see the history of present illness.    All other systems reviewed and are negative.  EKGs/Labs/Other Studies Reviewed:    The following studies were reviewed today:    Recent Labs: 08/11/2020: Magnesium 2.4 09/28/2020: TSH 1.690 12/13/2020: ALT 27; BUN 16; Creatinine, Ser 0.89; Hemoglobin 12.0; Platelets 218; Potassium 3.3; Sodium 139  Recent Lipid Panel    Component Value Date/Time   CHOL 143 12/13/2020 1044   TRIG 89 12/13/2020 1044   HDL 55 12/13/2020 1044   CHOLHDL 2.6 12/13/2020 1044   LDLCALC 71 12/13/2020 1044    Physical Exam:    VS:  BP (!) 142/70 (BP Location: Left Arm, Patient Position: Sitting)   Pulse 68   Ht '5\' 6"'  (1.676 m)   Wt 217 lb (98.4 kg)   SpO2 97%   BMI 35.02 kg/m     Wt Readings from Last 3 Encounters:  02/05/21 217 lb (98.4 kg)  01/29/21 218 lb (98.9 kg)  12/06/20 211 lb 3.2 oz (95.8 kg)     GEN:  Well nourished, well developed in no acute distress HEENT: Normal NECK: No JVD; No carotid bruits LYMPHATICS: No lymphadenopathy CARDIAC: Grade 1/6 localized midsystolic murmur aortic area no AR RRR, no murmurs, rubs, gallops RESPIRATORY:  Clear to auscultation without rales, wheezing or rhonchi  ABDOMEN: Soft, non-tender, non-distended MUSCULOSKELETAL:  No edema; No deformity  SKIN: Warm and dry NEUROLOGIC:  Alert and oriented x 3 PSYCHIATRIC:  Normal affect    Signed, Shirlee More, MD  02/05/2021 9:35 AM    [place]

## 2021-02-05 ENCOUNTER — Encounter: Payer: Self-pay | Admitting: Cardiology

## 2021-02-05 ENCOUNTER — Ambulatory Visit (INDEPENDENT_AMBULATORY_CARE_PROVIDER_SITE_OTHER): Payer: Medicare Other | Admitting: Cardiology

## 2021-02-05 ENCOUNTER — Other Ambulatory Visit: Payer: Self-pay

## 2021-02-05 VITALS — BP 142/70 | HR 68 | Ht 66.0 in | Wt 217.0 lb

## 2021-02-05 DIAGNOSIS — I480 Paroxysmal atrial fibrillation: Secondary | ICD-10-CM

## 2021-02-05 DIAGNOSIS — Z953 Presence of xenogenic heart valve: Secondary | ICD-10-CM

## 2021-02-05 DIAGNOSIS — Z954 Presence of other heart-valve replacement: Secondary | ICD-10-CM | POA: Diagnosis not present

## 2021-02-05 DIAGNOSIS — E782 Mixed hyperlipidemia: Secondary | ICD-10-CM | POA: Diagnosis not present

## 2021-02-05 DIAGNOSIS — I10 Essential (primary) hypertension: Secondary | ICD-10-CM

## 2021-02-05 NOTE — Patient Instructions (Signed)
Medication Instructions:  Your physician recommends that you continue on your current medications as directed. Please refer to the Current Medication list given to you today.  *If you need a refill on your cardiac medications before your next appointment, please call your pharmacy*   Lab Work: None If you have labs (blood work) drawn today and your tests are completely normal, you will receive your results only by: [place] (if you have MyChart) OR A paper copy in the mail If you have any lab test that is abnormal or we need to change your treatment, we will call you to review the results.   Testing/Procedures: None   Follow-Up: At [facility], you and your health needs are our priority.  As part of our continuing mission to provide you with exceptional heart care, we have created designated Provider Care Teams.  These Care Teams include your primary Cardiologist (physician) and Advanced Practice Providers (APPs -  Physician Assistants and Nurse Practitioners) who all work together to provide you with the care you need, when you need it.  We recommend signing up for the patient portal called "MyChart".  Sign up information is provided on this After Visit Summary.  MyChart is used to connect with patients for Virtual Visits (Telemedicine).  Patients are able to view lab/test results, encounter notes, upcoming appointments, etc.  Non-urgent messages can be sent to your provider as well.   To learn more about what you can do with MyChart, go to [email or web address].    Your next appointment:   6 month(s)  The format for your next appointment:   In Person  Provider:   Shirlee More, MD   Other Instructions   Tips to measure your blood pressure correctly  To determine whether you have hypertension, a medical professional will take a blood pressure reading. How you prepare for the test, the position of your arm, and other factors can change a blood pressure reading by 10%  or more. That could be enough to hide high blood pressure, start you on a drug you don't really need, or lead your doctor to incorrectly adjust your medications. National and international guidelines offer specific instructions for measuring blood pressure. If a doctor, nurse, or medical assistant isn't doing it right, don't hesitate to ask him or her to get with the guidelines. Here's what you can do to ensure a correct reading:  Don't drink a caffeinated beverage or smoke during the 30 minutes before the test.  Sit quietly for five minutes before the test begins.  During the measurement, sit in a chair with your feet on the floor and your arm supported so your elbow is at about heart level.  The inflatable part of the cuff should completely cover at least 80% of your upper arm, and the cuff should be placed on bare skin, not over a shirt.  Don't talk during the measurement.  Have your blood pressure measured twice, with a brief break in between. If the readings are different by 5 points or more, have it done a third time. There are times to break these rules. If you sometimes feel lightheaded when getting out of bed in the morning or when you stand after sitting, you should have your blood pressure checked while seated and then while standing to see if it falls from one position to the next. Because blood pressure varies throughout the day, your doctor will rarely diagnose hypertension on the basis of a single reading. Instead, he or she will  want to confirm the measurements on at least two occasions, usually within a few weeks of one another. The exception to this rule is if you have a blood pressure reading of 180/110 mm Hg or higher. A result this high usually calls for prompt treatment. It's also a good idea to have your blood pressure measured in both arms at least once, since the reading in one arm (usually the right) may be higher than that in the left. A 2014 study in The American Journal of  Medicine of nearly 3,400 people found average arm- to-arm differences in systolic blood pressure of about 5 points. The higher number should be used to make treatment decisions. In 2017, new guidelines from the [employer], the [employer] of Cardiology, and nine other health organizations lowered the diagnosis of high blood pressure to 130/80 mm Hg or higher for all adults. The guidelines also redefined the various blood pressure categories to now include normal, elevated, Stage 1 hypertension, Stage 2 hypertension, and hypertensive crisis (see "Blood pressure categories"). Blood pressure categories  Blood pressure category SYSTOLIC (upper number)  DIASTOLIC (lower number)  Normal Less than 120 mm Hg and Less than 80 mm Hg  Elevated 120-129 mm Hg and Less than 80 mm Hg  High blood pressure: Stage 1 hypertension 130-139 mm Hg or 80-89 mm Hg  High blood pressure: Stage 2 hypertension 140 mm Hg or higher or 90 mm Hg or higher  Hypertensive crisis (consult your doctor immediately) Higher than 180 mm Hg and/or Higher than 120 mm Hg  Source: American Heart Association and American Stroke Association. For more on getting your blood pressure under control, buy Controlling Your Blood Pressure, a Special Health Report from [facility].

## 2021-02-07 DIAGNOSIS — Z954 Presence of other heart-valve replacement: Secondary | ICD-10-CM | POA: Diagnosis not present

## 2021-02-09 DIAGNOSIS — Z954 Presence of other heart-valve replacement: Secondary | ICD-10-CM | POA: Diagnosis not present

## 2021-02-12 ENCOUNTER — Telehealth: Payer: Self-pay

## 2021-02-12 DIAGNOSIS — Z954 Presence of other heart-valve replacement: Secondary | ICD-10-CM | POA: Diagnosis not present

## 2021-02-12 NOTE — Chronic Care Management (AMB) (Signed)
Chronic Care Management Pharmacy Assistant   Name: Megan Galvan  MRN: [id number] DOB: [birth year]   Reason for Encounter: Disease State call for DM   Recent office visits:  None since 01/04/21  Recent consult visits:  02/05/21 (Cardiology) Shirlee More MD. Seen for Afib. D/C Amitiza 24 mcg due to change in therapy. Follow up in 6 months.  01/29/21 Gertie [facility]ey) Jackquline Denmark MD. Seen for Hemorrhoids. Started Cutivate cream 0.05% topically 2 times daily for 10 days.  Hospital visits:  None since 01/04/21  Medications: Outpatient Encounter Medications as of 02/12/2021  Medication Sig   acetaminophen (TYLENOL) 500 MG tablet Take 1,000 mg by mouth every 6 (six) hours as needed for moderate pain or headache.   aspirin EC 81 MG tablet Take 1 tablet (81 mg total) by mouth daily. Swallow whole.   Blood Glucose Monitoring Suppl (ACCU-CHEK GUIDE ME) w/Device KIT Use as directed   Calcium Carb-Cholecalciferol (CALCIUM 600 + D PO) Take 2 tablets by mouth daily.   cetirizine (ZYRTEC) 10 MG tablet Take 10 mg by mouth daily.   chlorthalidone (HYGROTON) 50 MG tablet TAKE 1 TABLET BY MOUTH  DAILY   conjugated estrogens (PREMARIN) vaginal cream Place 1 Applicatorful vaginally 2 (two) times a week.   diltiazem (CARDIZEM) 30 MG tablet Take 1 tablet (30 mg total) by mouth every 6 (six) hours as needed (If your heart rate is greater than 100.).   famotidine (PEPCID) 40 MG tablet TAKE 1 TABLET BY MOUTH AT  BEDTIME   fluticasone (CUTIVATE) 0.05 % cream Apply topically 2 (two) times daily. For 10 days to rectum   fluticasone (FLONASE) 50 MCG/ACT nasal spray Place 1 spray into the nose daily as needed for allergies.   glucose blood (ACCU-CHEK GUIDE) test strip Use as instructed   hydrocortisone (ANUSOL-HC) 25 MG suppository Place 1 suppository (25 mg total) rectally 2 (two) times daily.   linaclotide (LINZESS) 145 MCG CAPS capsule Take 1 capsule (145 mcg total) by mouth daily before breakfast.   Melatonin 5  MG CAPS Take 5 mg by mouth at bedtime.   Multiple Vitamin (MULTIVITAMIN WITH MINERALS) TABS tablet Take 1 tablet by mouth daily.   omeprazole (PRILOSEC) 40 MG capsule TAKE 1 CAPSULE BY MOUTH  TWICE DAILY BEFORE MEALS   potassium chloride SA (KLOR-CON) 20 MEQ tablet TAKE 2 TABLETS BY MOUTH IN  THE MORNING AND 1 TABLET IN THE EVENING (Patient taking differently: TAKE 2 TABLETS BY MOUTH IN  THE MORNING AND  2 TABLETS  IN THE EVENING)   pravastatin (PRAVACHOL) 40 MG tablet TAKE 1 TABLET BY MOUTH AT  BEDTIME   PRESCRIPTION MEDICATION Apply 1 application topically 4 (four) times daily as needed (hemorrhoid). Nifedipine 5% ointment  Rx is sent to [facility]. Last called in on 07/19/2020 with 1 yr of refills.   sitaGLIPtin (JANUVIA) 50 MG tablet Take 50 mg by mouth daily.   valsartan (DIOVAN) 320 MG tablet TAKE 1 TABLET BY MOUTH  DAILY   No facility-administered encounter medications on file as of 02/12/2021.   Recent Relevant Labs: Lab Results  Component Value Date/Time   HGBA1C 6.6 (H) 12/13/2020 10:44 AM   HGBA1C 6.5 (H) 08/08/2020 11:59 AM   MICROALBUR 10 12/13/2020 10:44 AM   MICROALBUR 10 05/09/2020 11:32 AM    Kidney Function Lab Results  Component Value Date/Time   CREATININE 0.89 12/13/2020 10:44 AM   CREATININE 1.00 12/06/2020 11:30 AM   GFRNONAA >60 08/13/2020 05:29 AM   GFRAA 84  05/09/2020 11:32 AM     Current antihyperglycemic regimen:  accu-chek softclix daily aviva plus test strips daily  Sitagliptin 63m  (PAP, yearly in December)  Patient verbally confirms she is taking the above medications as directed. Yes  What recent interventions/DTPs have been made to improve glycemic control:  Pt stated no changes   Have there been any recent hospitalizations or ED visits since last visit with CPP? No  Patient denies hypoglycemic symptoms, including None  Patient denies hyperglycemic symptoms, including none  How often are you checking your blood sugar? in the  morning before eating or drinking  What are your blood sugars ranging?  02/13/21 138 02/12/21 122 02/11/21 140 02/10/21 156 02/09/21 150 02/08/21 155 02/07/21 148 02/06/21 149  On insulin? No  During the week, how often does your blood glucose drop below 70? Never  Are you checking your feet daily/regularly? Yes  Adherence Review: Is the patient currently on a STATIN medication? Yes Is the patient currently on ACE/ARB medication? Yes Does the patient have >5 day gap between last estimated fill dates? No  Care Gaps: Last eye exam / Retinopathy Screening? Done on 03/20/20 Last Annual Wellness Visit?  None noted  Last Diabetic Foot Exam? Done on 09/11/20   Star Rating Drugs:  Medication:  Last Fill: Day Supply Januvia            Patient Assistance    DElray Mcgregor CVan BurenPharmacist Assistant  [phone]

## 2021-02-14 ENCOUNTER — Other Ambulatory Visit: Payer: Self-pay | Admitting: Family Medicine

## 2021-02-14 DIAGNOSIS — Z954 Presence of other heart-valve replacement: Secondary | ICD-10-CM | POA: Diagnosis not present

## 2021-02-14 NOTE — Telephone Encounter (Signed)
Pt also called about request. She is almost out. Do not see medication on med list.   Harrell Lark 02/14/21 8:35 AM

## 2021-02-16 DIAGNOSIS — G4733 Obstructive sleep apnea (adult) (pediatric): Secondary | ICD-10-CM | POA: Diagnosis not present

## 2021-02-21 ENCOUNTER — Telehealth: Payer: Self-pay

## 2021-02-21 NOTE — Telephone Encounter (Cosign Needed)
error 

## 2021-03-16 ENCOUNTER — Ambulatory Visit: Payer: Medicare Other | Admitting: Family Medicine

## 2021-03-16 ENCOUNTER — Ambulatory Visit (INDEPENDENT_AMBULATORY_CARE_PROVIDER_SITE_OTHER): Payer: Medicare Other

## 2021-03-16 ENCOUNTER — Other Ambulatory Visit: Payer: Self-pay

## 2021-03-16 ENCOUNTER — Ambulatory Visit (INDEPENDENT_AMBULATORY_CARE_PROVIDER_SITE_OTHER): Payer: Medicare Other | Admitting: Family Medicine

## 2021-03-16 VITALS — BP 124/68 | HR 80 | Temp 96.9°F | Resp 16 | Ht 66.0 in | Wt 214.0 lb

## 2021-03-16 DIAGNOSIS — Z23 Encounter for immunization: Secondary | ICD-10-CM | POA: Diagnosis not present

## 2021-03-16 DIAGNOSIS — E1142 Type 2 diabetes mellitus with diabetic polyneuropathy: Secondary | ICD-10-CM

## 2021-03-16 DIAGNOSIS — L650 Telogen effluvium: Secondary | ICD-10-CM | POA: Insufficient documentation

## 2021-03-16 DIAGNOSIS — E782 Mixed hyperlipidemia: Secondary | ICD-10-CM | POA: Diagnosis not present

## 2021-03-16 DIAGNOSIS — Z953 Presence of xenogenic heart valve: Secondary | ICD-10-CM

## 2021-03-16 DIAGNOSIS — K219 Gastro-esophageal reflux disease without esophagitis: Secondary | ICD-10-CM

## 2021-03-16 DIAGNOSIS — I10 Essential (primary) hypertension: Secondary | ICD-10-CM

## 2021-03-16 MED ORDER — PREMARIN 0.625 MG/GM VA CREA: TOPICAL_CREAM | VAGINAL | 2 refills | Status: DC

## 2021-03-16 NOTE — Progress Notes (Signed)
Subjective:  Patient ID: Megan Galvan, female    DOB: Jan 28, 1951  Age: 70 y.o. MRN: [id number]  Chief Complaint  Patient presents with   Diabetes   Hypertension   Hyperlipidemia    HPI Diabetes: She is taking Januvia 50 mg daily. Sugars 130-140. Checks sugars daily. Checks feet daily, Gets annual exam. Healthy diet.  Hypertension: Currently taking valsartan 320 mg daily, Diltiazem 30 mg every 6 hours PRN, chlorthalidone 50 mg, aspirin 81 mg daily. Hyperlipidemia: Patient takes pravastatin 40 mg at bedtime. GERD: She takes famotidine 40 mg daily, omeprazole 40 mg daily.   Current Outpatient Medications on File Prior to Visit  Medication Sig Dispense Refill   acetaminophen (TYLENOL) 500 MG tablet Take 1,000 mg by mouth every 6 (six) hours as needed for moderate pain or headache.     aspirin EC 81 MG tablet Take 1 tablet (81 mg total) by mouth daily. Swallow whole. 90 tablet 3   Blood Glucose Monitoring Suppl (ACCU-CHEK GUIDE ME) w/Device KIT Use as directed 1 kit 0   Calcium Carb-Cholecalciferol (CALCIUM 600 + D PO) Take 2 tablets by mouth daily.     cetirizine (ZYRTEC) 10 MG tablet Take 10 mg by mouth daily.     chlorthalidone (HYGROTON) 50 MG tablet TAKE 1 TABLET BY MOUTH  DAILY 90 tablet 3   diltiazem (CARDIZEM) 30 MG tablet Take 1 tablet (30 mg total) by mouth every 6 (six) hours as needed (If your heart rate is greater than 100.). 30 tablet 3   famotidine (PEPCID) 40 MG tablet TAKE 1 TABLET BY MOUTH AT  BEDTIME 90 tablet 3   fluticasone (CUTIVATE) 0.05 % cream Apply topically 2 (two) times daily. For 10 days to rectum 30 g 2   fluticasone (FLONASE) 50 MCG/ACT nasal spray Place 1 spray into the nose daily as needed for allergies.     glucose blood (ACCU-CHEK GUIDE) test strip Use as instructed 100 each 3   hydrocortisone (ANUSOL-HC) 25 MG suppository Place 1 suppository (25 mg total) rectally 2 (two) times daily. 30 suppository 2   linaclotide (LINZESS) 145 MCG CAPS capsule Take  1 capsule (145 mcg total) by mouth daily before breakfast. 30 capsule 5   Melatonin 5 MG CAPS Take 5 mg by mouth at bedtime.     Multiple Vitamin (MULTIVITAMIN WITH MINERALS) TABS tablet Take 1 tablet by mouth daily.     omeprazole (PRILOSEC) 40 MG capsule TAKE 1 CAPSULE BY MOUTH  TWICE DAILY BEFORE MEALS 180 capsule 3   potassium chloride SA (KLOR-CON) 20 MEQ tablet TAKE 2 TABLETS BY MOUTH IN  THE MORNING AND 1 TABLET IN THE EVENING (Patient taking differently: 40 mEq 2 (two) times daily. TAKE 2 TABLETS BY MOUTH IN  THE MORNING AND  2 TABLETS  IN THE EVENING) 270 tablet 3   pravastatin (PRAVACHOL) 40 MG tablet TAKE 1 TABLET BY MOUTH AT  BEDTIME 90 tablet 3   PRESCRIPTION MEDICATION Apply 1 application topically 4 (four) times daily as needed (hemorrhoid). Nifedipine 5% ointment  Rx is sent to [facility]. Last called in on 07/19/2020 with 1 yr of refills.     sitaGLIPtin (JANUVIA) 50 MG tablet Take 50 mg by mouth daily.     traZODone (DESYREL) 100 MG tablet TAKE 1 TABLET BY MOUTH  BEFORE BEDTIME 90 tablet 3   valsartan (DIOVAN) 320 MG tablet TAKE 1 TABLET BY MOUTH  DAILY 90 tablet 3   No current facility-administered medications on file prior to  visit.   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   21 mm Edwards Resilia Inspiria Bioprosthetic Tissue Valve  SN [id number] Model 11500A   Sleep apnea    cpap   Past Surgical History:  Procedure Laterality Date   ANTERIOR CERVICAL DECOMP/DISCECTOMY FUSION     x 2   AORTIC VALVE REPLACEMENT N/A 08/10/2020   Procedure: AORTIC VALVE REPLACEMENT (AVR) USING INSPIRIS VALVE  SIZE 21MM;  Surgeon: Rexene Alberts, MD;  Location: [facility];  Service: Open Heart Surgery;  Laterality: N/A;   BACK SURGERY  11/17/2016   L3-L5   BILATERAL CARPAL TUNNEL RELEASE     bone spur Left    left shoulder   bone spur Right    Right shoulder   COLONOSCOPY  08/22/2011   Moderate predominantly sigmoid diverticulosis. Small internal hemorrhoids.    COLONOSCOPY     HEMORROIDECTOMY  02/2019   NECK SURGERY     RIGHT/LEFT HEART CATH AND CORONARY ANGIOGRAPHY N/A 07/27/2020   Procedure: RIGHT/LEFT HEART CATH AND CORONARY ANGIOGRAPHY;  Surgeon: Sherren Mocha, MD;  Location: [facility];  Service: Cardiovascular;  Laterality: N/A;   TEE WITHOUT CARDIOVERSION N/A 08/10/2020   Procedure: TRANSESOPHAGEAL ECHOCARDIOGRAM (TEE);  Surgeon: Rexene Alberts, MD;  Location: [facility];  Service: Open Heart Surgery;  Laterality: N/A;   torn rotator cuff Right    Right shoulder   torn rotator cuff Left    left shoulder   TRIGGER FINGER RELEASE Left    thumb   TRIGGER FINGER RELEASE Right    thumb and middle finger   UPPER GASTROINTESTINAL ENDOSCOPY     WRIST SURGERY Left    torn ligament   WRIST SURGERY Right    cyst    Family History  Problem Relation Age of Onset   Colon cancer Paternal Grandmother    Esophageal cancer Neg Hx    Rectal cancer Neg Hx    Stomach cancer Neg Hx    Breast cancer Neg Hx    Social History   Socioeconomic History   Marital status: Married    Spouse name: Not on file   Number of children: 2   Years of education: Not on file   Highest education level: Not on file  Occupational History   Not on file  Tobacco Use   Smoking status: Never   Smokeless tobacco: Never  Vaping Use   Vaping Use: Never used  Substance and Sexual Activity   Alcohol use: Never   Drug use: Never   Sexual activity: Not on file  Other Topics Concern   Not on file  Social History Narrative   Not on file   Social Determinants of Health   Financial Resource Strain: High  Risk   Difficulty of Paying Living Expenses: Hard  Food Insecurity: Not on file  Transportation Needs: Not on file  Physical Activity: Not on file  Stress: Not on file  Social Connections: Not on file    Review of Systems  Constitutional:  Negative for chills, fatigue and  fever.  HENT:  Negative for congestion, rhinorrhea and sore throat.   Eyes:  Positive for visual disturbance (cataract).  Respiratory:  Negative for cough and shortness of breath.   Cardiovascular:  Negative for chest pain.  Gastrointestinal:  Positive for constipation (occasional). Negative for abdominal pain, diarrhea, nausea and vomiting.  Genitourinary:  Negative for dysuria and urgency.  Musculoskeletal:  Negative for back pain and myalgias.  Neurological:  Negative for dizziness, weakness, light-headedness and headaches.  Psychiatric/Behavioral:  Negative for dysphoric mood. The patient is not nervous/anxious.     Objective:  BP 124/68   Pulse 80   Temp (!) 96.9 F (36.1 C)   Resp 16   Ht '5\' 6"'  (1.676 m)   Wt 214 lb (97.1 kg)   BMI 34.54 kg/m   BP/Weight 03/16/2021 02/05/2021 24/07/6284  Systolic BP 381 771 165  Diastolic BP 68 70 78  Wt. (Lbs) 214 217 218  BMI 34.54 35.02 35.19    Physical Exam Vitals reviewed.  Constitutional:      Appearance: Normal appearance.  Neck:     Vascular: No carotid bruit.  Cardiovascular:     Rate and Rhythm: Normal rate and regular rhythm.     Pulses: Normal pulses.     Heart sounds: Normal heart sounds.  Pulmonary:     Effort: Pulmonary effort is normal.     Breath sounds: Normal breath sounds.  Abdominal:     General: Bowel sounds are normal.     Palpations: Abdomen is soft.     Tenderness: There is no abdominal tenderness.  Neurological:     Mental Status: She is alert and oriented to person, place, and time.  Psychiatric:        Mood and Affect: Mood normal.        Behavior: Behavior normal.    Diabetic Foot Exam - Simple   Simple Foot Form   03/16/2021  8:42 PM  Visual Inspection No deformities, no ulcerations, no other skin breakdown bilaterally: Yes Sensation Testing Intact to touch and monofilament testing bilaterally: Yes Pulse Check Posterior Tibialis and Dorsalis pulse intact bilaterally: Yes Comments      Lab Results  Component Value Date   WBC 7.4 03/16/2021   HGB 12.9 03/16/2021   HCT 37.9 03/16/2021   PLT 223 03/16/2021   GLUCOSE 146 (H) 03/16/2021   CHOL 143 12/13/2020   TRIG 89 12/13/2020   HDL 55 12/13/2020   LDLCALC 71 12/13/2020   ALT 32 03/16/2021   AST 33 03/16/2021   NA 139 03/16/2021   K 4.3 03/16/2021   CL 95 (L) 03/16/2021   CREATININE 0.91 03/16/2021   BUN 15 03/16/2021   CO2 29 03/16/2021   TSH 1.180 03/16/2021   INR 1.5 (H) 08/10/2020   HGBA1C 6.6 (H) 03/16/2021   MICROALBUR 10 12/13/2020      Assessment & Plan:   Problem List Items Addressed This Visit       Cardiovascular and Mediastinum   Essential hypertension    Well controlled.  No changes in valsartan 320 mg daily, chlorthalidone 50 mg. Continue to work on eating a healthy diet and exercise.  Labs drawn today.       Relevant Orders   CBC with Differential/Platelet (Completed)   Comprehensive metabolic panel (Completed)     Digestive   Gastro-esophageal reflux disease without esophagitis    The current medical regimen is effective;  continue present plan and medications.        Endocrine  Type 2 diabetes mellitus with diabetic polyneuropathy ([place]) - Primary    Control: Well controlled. Recommend check sugars fasting daily. Recommend check feet daily. Recommend annual eye exams. Medicines: Continue with Januvia 50 mg daily,  Continue to work on eating a healthy diet and exercise.  Labs drawn today.        Relevant Orders   Hemoglobin A1c (Completed)     Musculoskeletal and Integument   Telogen effluvium   Relevant Orders   Iron, TIBC and Ferritin Panel (Completed)   VITAMIN D 25 Hydroxy (Vit-D  Deficiency, Fractures) (Completed)   TSH (Completed)     Other   S/P aortic valve replacement with bioprosthetic valve   Mixed hyperlipidemia  .  Meds ordered this encounter  Medications   conjugated estrogens (PREMARIN) vaginal cream    Sig: Place vaginally 2 (two) times a week.    Dispense:  42.5 g    Refill:  2    Orders Placed This Encounter  Procedures   CBC with Differential/Platelet   Comprehensive metabolic panel   Hemoglobin A1c   Iron, TIBC and Ferritin Panel   VITAMIN D 25 Hydroxy (Vit-D Deficiency, Fractures)   TSH   Follow-up: No follow-ups on file.  An After Visit Summary was printed and given to the patient.  Rochel Brome, MD [facility] [phone]

## 2021-03-17 LAB — CBC WITH DIFFERENTIAL/PLATELET
Basophils Absolute: 0.1 10*3/uL (ref 0.0–0.2)
Basos: 1 %
EOS (ABSOLUTE): 0.1 10*3/uL (ref 0.0–0.4)
Eos: 2 %
Hematocrit: 37.9 % (ref 34.0–46.6)
Hemoglobin: 12.9 g/dL (ref 11.1–15.9)
Immature Grans (Abs): 0 10*3/uL (ref 0.0–0.1)
Immature Granulocytes: 0 %
Lymphocytes Absolute: 2.1 10*3/uL (ref 0.7–3.1)
Lymphs: 28 %
MCH: 33.1 pg — ABNORMAL HIGH (ref 26.6–33.0)
MCHC: 34 g/dL (ref 31.5–35.7)
MCV: 97 fL (ref 79–97)
Monocytes Absolute: 0.8 10*3/uL (ref 0.1–0.9)
Monocytes: 10 %
Neutrophils Absolute: 4.4 10*3/uL (ref 1.4–7.0)
Neutrophils: 59 %
Platelets: 223 10*3/uL (ref 150–450)
RBC: 3.9 x10E6/uL (ref 3.77–5.28)
RDW: 12.5 % (ref 11.7–15.4)
WBC: 7.4 10*3/uL (ref 3.4–10.8)

## 2021-03-17 LAB — COMPREHENSIVE METABOLIC PANEL
ALT: 32 IU/L (ref 0–32)
AST: 33 IU/L (ref 0–40)
Albumin/Globulin Ratio: 1.5 (ref 1.2–2.2)
Albumin: 4.6 g/dL (ref 3.8–4.8)
Alkaline Phosphatase: 66 IU/L (ref 44–121)
BUN/Creatinine Ratio: 16 (ref 12–28)
BUN: 15 mg/dL (ref 8–27)
Bilirubin Total: 0.5 mg/dL (ref 0.0–1.2)
CO2: 29 mmol/L (ref 20–29)
Calcium: 10 mg/dL (ref 8.7–10.3)
Chloride: 95 mmol/L — ABNORMAL LOW (ref 96–106)
Creatinine, Ser: 0.91 mg/dL (ref 0.57–1.00)
Globulin, Total: 3 g/dL (ref 1.5–4.5)
Glucose: 146 mg/dL — ABNORMAL HIGH (ref 70–99)
Potassium: 4.3 mmol/L (ref 3.5–5.2)
Sodium: 139 mmol/L (ref 134–144)
Total Protein: 7.6 g/dL (ref 6.0–8.5)
eGFR: 68 mL/min/{1.73_m2} (ref >59)

## 2021-03-17 LAB — HEMOGLOBIN A1C
Est. average glucose Bld gHb Est-mCnc: 143 mg/dL
Hgb A1c MFr Bld: 6.6 % — ABNORMAL HIGH (ref 4.8–5.6)

## 2021-03-17 LAB — IRON,TIBC AND FERRITIN PANEL
Ferritin: 50 ng/mL (ref 15–150)
Iron Saturation: 31 % (ref 15–55)
Iron: 116 ug/dL (ref 27–139)
Total Iron Binding Capacity: 374 ug/dL (ref 250–450)
UIBC: 258 ug/dL (ref 118–369)

## 2021-03-17 LAB — TSH: TSH: 1.18 u[IU]/mL (ref 0.450–4.500)

## 2021-03-17 LAB — VITAMIN D 25 HYDROXY (VIT D DEFICIENCY, FRACTURES): Vit D, 25-Hydroxy: 56.2 ng/mL (ref 30.0–100.0)

## 2021-03-17 NOTE — Assessment & Plan Note (Addendum)
Control: Well controlled. Recommend check sugars fasting daily. Recommend check feet daily. Recommend annual eye exams. Medicines: Continue with Januvia 50 mg daily,  Continue to work on eating a healthy diet and exercise.  Labs drawn today.

## 2021-03-17 NOTE — Assessment & Plan Note (Signed)
Well controlled.  No changes in valsartan 320 mg daily, chlorthalidone 50 mg. Continue to work on eating a healthy diet and exercise.  Labs drawn today.

## 2021-03-17 NOTE — Assessment & Plan Note (Signed)
The current medical regimen is effective;  continue present plan and medications.  

## 2021-03-18 ENCOUNTER — Encounter: Payer: Self-pay | Admitting: Family Medicine

## 2021-03-21 DIAGNOSIS — E119 Type 2 diabetes mellitus without complications: Secondary | ICD-10-CM | POA: Diagnosis not present

## 2021-03-21 DIAGNOSIS — H52223 Regular astigmatism, bilateral: Secondary | ICD-10-CM | POA: Diagnosis not present

## 2021-03-21 DIAGNOSIS — H25042 Posterior subcapsular polar age-related cataract, left eye: Secondary | ICD-10-CM | POA: Diagnosis not present

## 2021-03-21 DIAGNOSIS — H524 Presbyopia: Secondary | ICD-10-CM | POA: Diagnosis not present

## 2021-03-21 DIAGNOSIS — H5203 Hypermetropia, bilateral: Secondary | ICD-10-CM | POA: Diagnosis not present

## 2021-03-21 LAB — HM DIABETES EYE EXAM

## 2021-03-27 ENCOUNTER — Other Ambulatory Visit: Payer: Self-pay

## 2021-03-27 ENCOUNTER — Ambulatory Visit (INDEPENDENT_AMBULATORY_CARE_PROVIDER_SITE_OTHER): Payer: Medicare Other | Admitting: Family Medicine

## 2021-03-27 ENCOUNTER — Encounter: Payer: Self-pay | Admitting: Family Medicine

## 2021-03-27 VITALS — BP 138/68 | HR 71 | Temp 97.3°F | Ht 66.0 in | Wt 214.0 lb

## 2021-03-27 DIAGNOSIS — N952 Postmenopausal atrophic vaginitis: Secondary | ICD-10-CM

## 2021-03-27 DIAGNOSIS — R300 Dysuria: Secondary | ICD-10-CM | POA: Diagnosis not present

## 2021-03-27 LAB — POCT URINALYSIS DIPSTICK
Bilirubin, UA: NEGATIVE
Blood, UA: NEGATIVE
Glucose, UA: NEGATIVE
Ketones, UA: NEGATIVE
Leukocytes, UA: NEGATIVE
Nitrite, UA: NEGATIVE
Protein, UA: NEGATIVE
Spec Grav, UA: 1.02 (ref 1.010–1.025)
Urobilinogen, UA: NEGATIVE E.U./dL — AB
pH, UA: 6 (ref 5.0–8.0)

## 2021-03-27 NOTE — Progress Notes (Signed)
Acute Office Visit  Subjective:    Patient ID: Megan Galvan, female    DOB: Feb 20, 1951, 70 y.o.   MRN: [id number]  Chief Complaint  Patient presents with   Vaginal itch/burn    X1 week    HPI Patient is in today for vaginal itching and sore on left labia for about one week. Some vaginal discharge. No abdominal pain. Some dysuria. On premarin cream twice a week.  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   21 mm Edwards Resilia Inspiria Bioprosthetic Tissue Valve  SN [id number] Model 11500A   Sleep apnea    cpap    Past Surgical History:  Procedure Laterality Date   ANTERIOR CERVICAL DECOMP/DISCECTOMY FUSION     x 2   AORTIC VALVE REPLACEMENT N/A 08/10/2020   Procedure: AORTIC VALVE REPLACEMENT (AVR) USING INSPIRIS VALVE SIZE 21MM;  Surgeon: Rexene Alberts, MD;  Location: [facility];  Service: Open Heart Surgery;  Laterality: N/A;   BACK SURGERY  11/17/2016   L3-L5   BILATERAL CARPAL TUNNEL RELEASE     bone spur Left    left shoulder   bone spur Right    Right shoulder   COLONOSCOPY  08/22/2011   Moderate predominantly sigmoid diverticulosis. Small internal hemorrhoids.    COLONOSCOPY     HEMORROIDECTOMY  02/2019   NECK SURGERY     RIGHT/LEFT HEART CATH AND CORONARY ANGIOGRAPHY N/A 07/27/2020   Procedure: RIGHT/LEFT HEART CATH AND CORONARY ANGIOGRAPHY;  Surgeon: Sherren Mocha, MD;  Location: [facility];  Service: Cardiovascular;  Laterality: N/A;   TEE WITHOUT CARDIOVERSION N/A 08/10/2020   Procedure: TRANSESOPHAGEAL ECHOCARDIOGRAM (TEE);  Surgeon: Rexene Alberts, MD;  Location: [facility];   Service: Open Heart Surgery;  Laterality: N/A;   torn rotator cuff Right    Right shoulder   torn rotator cuff Left    left shoulder   TRIGGER FINGER RELEASE Left    thumb   TRIGGER FINGER RELEASE Right    thumb and middle finger   UPPER GASTROINTESTINAL ENDOSCOPY     WRIST SURGERY Left    torn ligament   WRIST SURGERY Right    cyst    Family History  Problem Relation Age of Onset   Colon cancer Paternal Grandmother    Esophageal cancer Neg Hx    Rectal cancer Neg Hx    Stomach cancer Neg Hx    Breast cancer Neg Hx     Social History   Socioeconomic History   Marital status: Married    Spouse name: Not on file   Number of children: 2   Years of education: Not on file   Highest education level: Not on file  Occupational History   Not on file  Tobacco Use   Smoking status: Never   Smokeless tobacco: Never  Vaping Use   Vaping Use: Never used  Substance and Sexual Activity   Alcohol use: Never   Drug use: Never   Sexual activity: Not on file  Other Topics Concern   Not  on file  Social History Narrative   Not on file   Social Determinants of Health   Financial Resource Strain: High Risk   Difficulty of Paying Living Expenses: Hard  Food Insecurity: Not on file  Transportation Needs: Not on file  Physical Activity: Not on file  Stress: Not on file  Social Connections: Not on file  Intimate Partner Violence: Not on file    Outpatient Medications Prior to Visit  Medication Sig Dispense Refill   Accu-Chek Softclix Lancets lancets      acetaminophen (TYLENOL) 500 MG tablet Take 1,000 mg by mouth every 6 (six) hours as needed for moderate pain or headache.     aspirin EC 81 MG tablet Take 1 tablet (81 mg total) by mouth daily. Swallow whole. 90 tablet 3   Blood Glucose Monitoring Suppl (ACCU-CHEK GUIDE ME) w/Device KIT Use as directed 1 kit 0   Calcium Carb-Cholecalciferol (CALCIUM 600 + D PO) Take 2 tablets by mouth daily.     cetirizine (ZYRTEC) 10 MG  tablet Take 10 mg by mouth daily.     chlorthalidone (HYGROTON) 50 MG tablet TAKE 1 TABLET BY MOUTH  DAILY 90 tablet 3   conjugated estrogens (PREMARIN) vaginal cream Place vaginally 2 (two) times a week. 42.5 g 2   diltiazem (CARDIZEM) 30 MG tablet Take 1 tablet (30 mg total) by mouth every 6 (six) hours as needed (If your heart rate is greater than 100.). 30 tablet 3   famotidine (PEPCID) 40 MG tablet TAKE 1 TABLET BY MOUTH AT  BEDTIME 90 tablet 3   fluticasone (CUTIVATE) 0.05 % cream Apply topically 2 (two) times daily. For 10 days to rectum 30 g 2   fluticasone (FLONASE) 50 MCG/ACT nasal spray Place 1 spray into the nose daily as needed for allergies.     glucose blood (ACCU-CHEK GUIDE) test strip Use as instructed 100 each 3   hydrocortisone (ANUSOL-HC) 25 MG suppository Place 1 suppository (25 mg total) rectally 2 (two) times daily. 30 suppository 2   ketoconazole (NIZORAL) 2 % cream Apply topically 2 (two) times daily as needed.     Melatonin 5 MG CAPS Take 5 mg by mouth at bedtime.     Multiple Vitamin (MULTIVITAMIN WITH MINERALS) TABS tablet Take 1 tablet by mouth daily.     omeprazole (PRILOSEC) 40 MG capsule TAKE 1 CAPSULE BY MOUTH  TWICE DAILY BEFORE MEALS 180 capsule 3   potassium chloride SA (KLOR-CON) 20 MEQ tablet TAKE 2 TABLETS BY MOUTH IN  THE MORNING AND 1 TABLET IN THE EVENING (Patient taking differently: 40 mEq 2 (two) times daily. TAKE 2 TABLETS BY MOUTH IN  THE MORNING AND  2 TABLETS  IN THE EVENING) 270 tablet 3   pravastatin (PRAVACHOL) 40 MG tablet TAKE 1 TABLET BY MOUTH AT  BEDTIME 90 tablet 3   PRESCRIPTION MEDICATION Apply 1 application topically 4 (four) times daily as needed (hemorrhoid). Nifedipine 5% ointment  Rx is sent to [facility]. Last called in on 07/19/2020 with 1 yr of refills.     sitaGLIPtin (JANUVIA) 50 MG tablet Take 50 mg by mouth daily.     traZODone (DESYREL) 100 MG tablet TAKE 1 TABLET BY MOUTH  BEFORE BEDTIME 90 tablet 3   valsartan  (DIOVAN) 320 MG tablet TAKE 1 TABLET BY MOUTH  DAILY 90 tablet 3   linaclotide (LINZESS) 145 MCG CAPS capsule Take 1 capsule (145 mcg total) by mouth daily before breakfast. 30 capsule 5  No facility-administered medications prior to visit.    Allergies  Allergen Reactions   Ace Inhibitors Cough   Farxiga [Dapagliflozin]     Yeast infections    Jardiance [Empagliflozin]     Yeast infections   Keflex [Cephalexin] Itching   Latex     Burn and itch   Nexium [Esomeprazole Magnesium] Diarrhea   Protonix [Pantoprazole Sodium] Other (See Comments)    Constipation   Doxycycline Rash    Review of Systems  Constitutional:  Negative for appetite change, fatigue and fever.  HENT:  Negative for congestion, ear pain, sinus pressure and sore throat.   Eyes:  Negative for pain.  Respiratory:  Negative for cough, chest tightness, shortness of breath and wheezing.   Cardiovascular:  Negative for chest pain and palpitations.  Gastrointestinal:  Negative for abdominal pain, constipation, diarrhea, nausea and vomiting.  Genitourinary:  Positive for vaginal pain (Itch/burning x1 week). Negative for dysuria and hematuria.  Musculoskeletal:  Negative for arthralgias, back pain, joint swelling and myalgias.  Skin:  Negative for rash.  Neurological:  Negative for dizziness, weakness and headaches.  Psychiatric/Behavioral:  Negative for dysphoric mood. The patient is not nervous/anxious.       Objective:    Physical Exam Vitals reviewed. Exam conducted with a chaperone present.  Constitutional:      Appearance: Normal appearance. She is obese.  Genitourinary:    Exam position: Lithotomy position.     Comments: Atrophic vaginal lining, pale, white. No discharge. Very dry. Neurological:     Mental Status: She is alert and oriented to person, place, and time.  Psychiatric:        Mood and Affect: Mood normal.        Behavior: Behavior normal.    BP 138/68 (BP Location: Right Arm, Patient  Position: Sitting)   Pulse 71   Temp (!) 97.3 F (36.3 C) (Temporal)   Ht '5\' 6"'  (1.676 m)   Wt 214 lb (97.1 kg)   SpO2 98%   BMI 34.54 kg/m  Wt Readings from Last 3 Encounters:  03/27/21 214 lb (97.1 kg)  03/16/21 214 lb (97.1 kg)  02/05/21 217 lb (98.4 kg)    Health Maintenance Due  Topic Date Due   Hepatitis C Screening  Never done   Zoster Vaccines- Shingrix (1 of 2) Never done   Pneumonia Vaccine 62+ Years old (3 - PPSV23 if available, else PCV20) 04/13/2017   COVID-19 Vaccine (3 - Moderna risk series) 03/16/2021   OPHTHALMOLOGY EXAM  03/20/2021    There are no preventive care reminders to display for this patient.   Lab Results  Component Value Date   TSH 1.180 03/16/2021   Lab Results  Component Value Date   WBC 7.4 03/16/2021   HGB 12.9 03/16/2021   HCT 37.9 03/16/2021   MCV 97 03/16/2021   PLT 223 03/16/2021   Lab Results  Component Value Date   NA 139 03/16/2021   K 4.3 03/16/2021   CO2 29 03/16/2021   GLUCOSE 146 (H) 03/16/2021   BUN 15 03/16/2021   CREATININE 0.91 03/16/2021   BILITOT 0.5 03/16/2021   ALKPHOS 66 03/16/2021   AST 33 03/16/2021   ALT 32 03/16/2021   PROT 7.6 03/16/2021   ALBUMIN 4.6 03/16/2021   CALCIUM 10.0 03/16/2021   ANIONGAP 10 08/13/2020   EGFR 68 03/16/2021   Lab Results  Component Value Date   CHOL 143 12/13/2020   Lab Results  Component Value Date   HDL 55  12/13/2020   Lab Results  Component Value Date   LDLCALC 71 12/13/2020   Lab Results  Component Value Date   TRIG 89 12/13/2020   Lab Results  Component Value Date   CHOLHDL 2.6 12/13/2020   Lab Results  Component Value Date   HGBA1C 6.6 (H) 03/16/2021         Assessment & Plan:   Problem List Items Addressed This Visit       Genitourinary   Atrophic vaginitis    Increase Premarin Cream to daily for 2 weeks then decrease to 3 times per week. If flares up, increases again to daily for 1 - 2 weeks.        Other   Dysuria - Primary    Relevant Orders   POCT urinalysis dipstick (Completed)     I,Lauren M Auman,acting as a scribe for Rochel Brome, MD.,have documented all relevant documentation on the behalf of Rochel Brome, MD,as directed by  Rochel Brome, MD while in the presence of Rochel Brome, MD.   Rochel Brome, MD

## 2021-03-27 NOTE — Patient Instructions (Signed)
Recommend try hyland's cramps otc.  Use premarin cream daily x 2 weeks, then decrease to 3 times per week.

## 2021-03-31 DIAGNOSIS — N952 Postmenopausal atrophic vaginitis: Secondary | ICD-10-CM | POA: Insufficient documentation

## 2021-03-31 HISTORY — DX: Postmenopausal atrophic vaginitis: N95.2

## 2021-03-31 NOTE — Assessment & Plan Note (Signed)
Increase Premarin Cream to daily for 2 weeks then decrease to 3 times per week. If flares up, increases again to daily for 1 - 2 weeks.

## 2021-04-02 ENCOUNTER — Telehealth: Payer: Self-pay

## 2021-04-02 NOTE — Chronic Care Management (AMB) (Signed)
Pt was reminded of appt tomorrow with CPP and pt confirmed.  Elray Mcgregor, [facility] Pharmacist Assistant  [phone]

## 2021-04-03 ENCOUNTER — Other Ambulatory Visit: Payer: Self-pay

## 2021-04-03 ENCOUNTER — Ambulatory Visit (INDEPENDENT_AMBULATORY_CARE_PROVIDER_SITE_OTHER): Payer: Medicare Other

## 2021-04-03 DIAGNOSIS — E782 Mixed hyperlipidemia: Secondary | ICD-10-CM

## 2021-04-03 DIAGNOSIS — E1142 Type 2 diabetes mellitus with diabetic polyneuropathy: Secondary | ICD-10-CM

## 2021-04-03 DIAGNOSIS — K219 Gastro-esophageal reflux disease without esophagitis: Secondary | ICD-10-CM

## 2021-04-03 DIAGNOSIS — I10 Essential (primary) hypertension: Secondary | ICD-10-CM

## 2021-04-03 NOTE — Progress Notes (Signed)
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 In her free time, she enjoys going to church each weekend  Recommendations/Changes made from today's visit: -Coordinated PAP for 2023. -Patient still has no AWV scheduled. Tried to coordinate in September. I will send a msg to nursing to get one scheduled before end of year   Subjective: Megan Galvan is an 70 y.o. year old female who is a primary patient of Cox, Kirsten, MD.  The CCM team was consulted for assistance with disease management and care coordination needs.    Engaged with patient by telephone for follow up visit in response to provider referral for pharmacy case management and/or care coordination services.   Consent to Services:  The patient was given the following information about Chronic Care Management services today, agreed to services, and gave verbal consent: 1. CCM service includes personalized support from designated clinical staff supervised by the primary care provider, including individualized plan of care and coordination with other care providers 2. 24/7 contact phone numbers for assistance for urgent and routine care needs. 3. Service will only be billed when office clinical staff spend 20 minutes or more in a month to coordinate care. 4. Only one practitioner may furnish and bill the service in a calendar month. 5.The patient may stop CCM services at any time (effective at the end of the month) by phone call to the office staff. 6. The patient will be responsible for cost sharing (co-pay) of up to 20% of the service fee (after annual deductible is met). Patient agreed to services and consent obtained.  Patient Care Team: Rochel Brome, MD as PCP - General (Family Medicine) Burnice Logan, [facility] (Inactive) as Pharmacist (Pharmacist) Lane Hacker, [facility] as Pharmacist (Pharmacist)     Objective:  Lab Results  Component Value Date   CREATININE 0.91 03/16/2021   BUN 15 03/16/2021   GFRNONAA >60 08/13/2020   GFRAA 84 05/09/2020   NA 139 03/16/2021   K 4.3 03/16/2021   CALCIUM 10.0 03/16/2021   CO2 29 03/16/2021   GLUCOSE 146 (H) 03/16/2021    Lab Results  Component Value Date/Time   HGBA1C 6.6 (H) 03/16/2021 10:28 AM   HGBA1C 6.6 (H) 12/13/2020 10:44 AM   MICROALBUR 10 12/13/2020 10:44 AM   MICROALBUR 10 05/09/2020 11:32 AM    Last diabetic Eye exam:  Lab Results  Component Value Date/Time   HMDIABEYEEXA No Retinopathy 03/20/2020 04:25 PM    Last diabetic Foot exam: No results found for: HMDIABFOOTEX   Lab Results  Component Value Date   CHOL 143 12/13/2020   HDL 55 12/13/2020   LDLCALC 71 12/13/2020   TRIG 89 12/13/2020   CHOLHDL 2.6 12/13/2020    Hepatic Function Latest Ref Rng & Units 03/16/2021 12/13/2020 12/06/2020  Total Protein 6.0 - 8.5 g/dL 7.6 7.4 6.9  Albumin 3.8 - 4.8 g/dL 4.6 4.5 4.3  AST 0 - 40 IU/L 33 28 29  ALT 0 - 32 IU/L 32 27 23  Alk Phosphatase 44 - 121 IU/L 66 68 75  Total Bilirubin 0.0 - 1.2 mg/dL 0.5 0.5 0.3    Lab Results  Component Value Date/Time  TSH 1.180 03/16/2021 10:28 AM   TSH 1.690 09/28/2020 11:43 AM    CBC Latest Ref Rng & Units 03/16/2021 12/13/2020 12/06/2020  WBC 3.4 - 10.8 x10E3/uL 7.4 6.2 5.9  Hemoglobin 11.1 - 15.9 g/dL 12.9 12.0 12.1  Hematocrit 34.0 - 46.6 % 37.9 36.7 36.5  Platelets 150 - 450 x10E3/uL 223 218 221    Lab Results  Component Value Date/Time   VD25OH 56.2 03/16/2021 10:28 AM    Clinical ASCVD: Yes  The 10-year ASCVD risk score (Arnett DK, et al., 2019) is: 21.4%   Values used to calculate the score:     Age: 70 years     Sex: Female     Is Non-Hispanic African American: No     Diabetic: Yes     Tobacco smoker:  No     Systolic Blood Pressure: 665 mmHg     Is BP treated: Yes     HDL Cholesterol: 55 mg/dL     Total Cholesterol: 143 mg/dL    Depression screen [facility] 2/9 03/16/2021 12/13/2020 09/28/2020  Decreased Interest 0 0 0  Down, Depressed, Hopeless 0 0 0  PHQ - 2 Score 0 0 0  Altered sleeping - - -  Tired, decreased energy - - -  Change in appetite - - -  Feeling bad or failure about yourself  - - -  Trouble concentrating - - -  Moving slowly or fidgety/restless - - -  Suicidal thoughts - - -  PHQ-9 Score - - -  Difficult doing work/chores - - -     Other: (CHADS2VASc if Afib, MMRC or CAT for COPD, ACT, DEXA)  Social History   Tobacco Use  Smoking Status Never  Smokeless Tobacco Never   BP Readings from Last 3 Encounters:  03/27/21 138/68  03/16/21 124/68  02/05/21 (!) 142/70   Pulse Readings from Last 3 Encounters:  03/27/21 71  03/16/21 80  02/05/21 68   Wt Readings from Last 3 Encounters:  03/27/21 214 lb (97.1 kg)  03/16/21 214 lb (97.1 kg)  02/05/21 217 lb (98.4 kg)   BMI Readings from Last 3 Encounters:  03/27/21 34.54 kg/m  03/16/21 34.54 kg/m  02/05/21 35.02 kg/m    Assessment/Interventions: Review of patient past medical history, allergies, medications, health status, including review of consultants reports, laboratory and other test data, was performed as part of comprehensive evaluation and provision of chronic care management services.   SDOH:  (Social Determinants of Health) assessments and interventions performed: Yes   SDOH Screenings   Alcohol Screen: Low Risk    Last Alcohol Screening Score (AUDIT): 0  Depression (PHQ2-9): Low Risk    PHQ-2 Score: 0  Financial Resource Strain: High Risk   Difficulty of Paying Living Expenses: Hard  Food Insecurity: Not on file  Housing: Not on file  Physical Activity: Not on file  Social Connections: Not on file  Stress: Not on file  Tobacco Use: Low Risk    Smoking Tobacco Use: Never   Smokeless Tobacco  Use: Never   Passive Exposure: Not on file  Transportation Needs: Not on file    CCM Care Plan  Allergies  Allergen Reactions   Ace Inhibitors Cough   Farxiga [Dapagliflozin]     Yeast infections    Jardiance [Empagliflozin]     Yeast infections   Keflex [Cephalexin] Itching   Latex     Burn and itch   Nexium [Esomeprazole Magnesium] Diarrhea   Protonix [Pantoprazole Sodium] Other (See Comments)  Constipation   Doxycycline Rash    Medications Reviewed Today     Reviewed by Oleta Mouse, CMA (Certified Medical Assistant) on 03/27/21 at 1425  Med List Status: <None>   Medication Order Taking? Sig Documenting Provider Last Dose Status Informant  Accu-Chek Softclix Lancets lancets [id number] Yes  [provider] Taking Active   acetaminophen (TYLENOL) 500 MG tablet [id number] Yes Take 1,000 mg by mouth every 6 (six) hours as needed for moderate pain or headache. [provider] Taking Active Self  aspirin EC 81 MG tablet [id number] Yes Take 1 tablet (81 mg total) by mouth daily. Swallow whole. Richardo Priest, MD Taking Active   Blood Glucose Monitoring Suppl (ACCU-CHEK GUIDE ME) w/Device Drucie Opitz [id number] Yes Use as directed Cox, Elnita Maxwell, MD Taking Active   Calcium Carb-Cholecalciferol (CALCIUM 600 + D PO) [id number] Yes Take 2 tablets by mouth daily. [provider] Taking Active Self  cetirizine (ZYRTEC) 10 MG tablet [id number] Yes Take 10 mg by mouth daily. [provider] Taking Active Self  chlorthalidone (HYGROTON) 50 MG tablet [id number] Yes TAKE 1 TABLET BY MOUTH  DAILY Cox, Kirsten, MD Taking Active   conjugated estrogens (PREMARIN) vaginal cream [id number] Yes Place vaginally 2 (two) times a week. Cox, Kirsten, MD Taking Active   diltiazem (CARDIZEM) 30 MG tablet [id number] Yes Take 1 tablet (30 mg total) by mouth every 6 (six) hours as needed (If your heart rate is greater than 100.). Richardo Priest, MD Taking Active   famotidine (PEPCID) 40  MG tablet [id number] Yes TAKE 1 TABLET BY MOUTH AT  BEDTIME Cox, Kirsten, MD Taking Active   fluticasone (CUTIVATE) 0.05 % cream [id number] Yes Apply topically 2 (two) times daily. For 10 days to rectum Jackquline Denmark, MD Taking Active   fluticasone [facility]) 50 MCG/ACT nasal spray [id number] Yes Place 1 spray into the nose daily as needed for allergies. [provider] Taking Active            Med Note Caryn Section, KYLE A   Wed Jul 26, 2020  1:08 PM)    glucose blood (ACCU-CHEK GUIDE) test strip [id number] Yes Use as instructed Cox, Kirsten, MD Taking Active   hydrocortisone (ANUSOL-HC) 25 MG suppository [id number] Yes Place 1 suppository (25 mg total) rectally 2 (two) times daily. Cox, Kirsten, MD Taking Active   ketoconazole (NIZORAL) 2 % cream [id number] Yes Apply topically 2 (two) times daily as needed. [provider] Taking Active   linaclotide Rolan Lipa) 145 MCG CAPS capsule [id number]  Take 1 capsule (145 mcg total) by mouth daily before breakfast. Jackquline Denmark, MD  Expired 02/28/21 2359   Melatonin 5 MG CAPS [id number] Yes Take 5 mg by mouth at bedtime. [provider] Taking Active Self  Multiple Vitamin (MULTIVITAMIN WITH MINERALS) TABS tablet [id number] Yes Take 1 tablet by mouth daily. [provider] Taking Active Self  omeprazole (PRILOSEC) 40 MG capsule [id number] Yes TAKE 1 CAPSULE BY MOUTH  TWICE DAILY BEFORE MEALS [name], PA-C Taking Active   potassium chloride SA (KLOR-CON) 20 MEQ tablet [id number] Yes TAKE 2 TABLETS BY MOUTH IN  THE MORNING AND 1 TABLET IN THE EVENING  Patient taking differently: 40 mEq 2 (two) times daily. TAKE 2 TABLETS BY MOUTH IN  THE MORNING AND  2 TABLETS  IN THE Julio Alm, Kirsten, MD Taking Active   pravastatin (PRAVACHOL) 40 MG tablet [id number] Yes TAKE 1 TABLET BY MOUTH AT  BEDTIME Cox, Kirsten, MD Taking Active   PRESCRIPTION  MEDICATION [id number] Yes Apply 1 application topically 4 (four) times daily as needed (hemorrhoid).  Nifedipine 5% ointment  Rx is sent to [facility]. Last called in on 07/19/2020 with 1 yr of refills. [provider] Taking Active Self  sitaGLIPtin (JANUVIA) 50 MG tablet [id number] Yes Take 50 mg by mouth daily. [provider] Taking Active   traZODone (DESYREL) 100 MG tablet [id number] Yes TAKE 1 TABLET BY MOUTH  BEFORE BEDTIME Cox, Kirsten, MD Taking Active   valsartan (DIOVAN) 320 MG tablet [id number] Yes TAKE 1 TABLET BY MOUTH  DAILY Cox, Kirsten, MD Taking Active             Patient Active Problem List   Diagnosis Date Noted   Atrophic vaginitis 03/31/2021   Telogen effluvium 03/16/2021   S/P aortic valve replacement with bioprosthetic valve 08/10/2020   S/P AVR (aortic valve replacement) 08/10/2020   Sleep apnea    Plantar fasciitis    Nonrheumatic aortic (valve) stenosis    Cataract    Bone spur of acromioclavicular joint, left    Arthritis    Allergy    Essential hypertension 10/01/2019   Mixed hyperlipidemia    Vertigo 07/20/2019   Class 1 obesity due to excess calories with serious comorbidity and body mass index (BMI) of 34.0 to 34.9 in adult 06/07/2019   Gastro-esophageal reflux disease without esophagitis 06/03/2019   Type 2 diabetes mellitus with diabetic polyneuropathy (Kyle) 06/03/2019   Primary insomnia 06/03/2019    Immunization History  Administered Date(s) Administered   Hepatitis B 06/22/2013, 11/16/2014, 05/26/2015   Influenza, High Dose Seasonal PF 12/29/2018   Influenza-Unspecified 01/27/2014, 01/24/2020, 01/27/2021   Moderna Covid-19 Vaccine Bivalent Booster 75yr & up 03/16/2021   Moderna SARS-COV2 Booster Vaccination 02/29/2020, 09/19/2020   Moderna Sars-Covid-2 Vaccination 06/07/2019, 07/05/2019   Pneumococcal Conjugate-13 12/29/2013   Pneumococcal Polysaccharide-23 04/13/2012   Td 11/23/2014    Conditions to be addressed/monitored:  Hypertension, Hyperlipidemia, and Diabetes  Care Plan : [place]  Updates made by KLane Hacker RMillheimsince 04/03/2021 12:00 AM     Problem: dm, htn, hld, gerd   Priority: High  Onset Date: 06/28/2020     Long-Range Goal: disease management   Start Date: 06/28/2020  Expected End Date: 06/28/2021  Recent Progress: On track  Priority: High  Note:    Current Barriers:  Patient working to increase exercise but dealing with shortness of breath and chest pain episodes.    Pharmacist Clinical Goal(s):  Over the next 90 days, patient will achieve control of chest pain episodes as evidenced by ability to exercise and avoid chest pain episodes.  through collaboration with PharmD and provider.   Interventions: 1:1 collaboration with Cox, KElnita Maxwell MD regarding development and update of comprehensive plan of care as evidenced by provider attestation and co-signature Inter-disciplinary care team collaboration (see longitudinal plan of care) Comprehensive medication review performed; medication list updated in electronic medical record  Hypertension (BP goal <140/90) -Controlled -Current treatment: chlorthalidone 50 mg am and hs Metoprolol succinate 50 mg daily  Valsartan 320 mg daily  -Medications previously tried: ace inhibitors  -Current home readings:   01/04/21: Readings from past few days Checks Daily AM:  148/81 (Ate a lot of salty pasta) 127/69 125/78 133/68 -Current dietary habits: trying to eat healthier for diabetes control.  -Current exercise habits: working to increase and start working more  -Reports hypotensive/hypertensive symptoms -Educated on BP goals and benefits of medications for prevention of heart attack, stroke  and kidney damage; Daily salt intake goal < 2300 mg; Exercise goal of 150 minutes per week; Importance of home blood pressure monitoring; -Counseled to monitor BP at home daily, document, and provide log at future appointments -Counseled on diet and exercise extensively Recommended to continue current  medication September 2022: Extensive time spent on counseling patient on blood pressure goal and impact of each antihypertensive medication on their blood pressure and risk reduction for CV disease.  Used analogies to explain the need for multiple antihypertensive medications to achieve BP goals and that it is often a silent disease with no symptoms.    Hyperlipidemia: (LDL goal < 70) -Controlled -Current treatment: Pravastatin 40 mg daily at bedtime -Medications previously tried: none reported  -Current dietary patterns: healthier diet and working to increase protein in diet -Current exercise habits: working to increase activity.  -Educated on Cholesterol goals;  Benefits of statin for ASCVD risk reduction; Importance of limiting foods high in cholesterol; Exercise goal of 150 minutes per week; -Counseled on diet and exercise extensively September 2022: Patient open to trying high intensity statin  Diabetes (A1c goal <7%) Lab Results  Component Value Date   HGBA1C 6.6 (H) 03/16/2021   HGBA1C 6.6 (H) 12/13/2020   HGBA1C 6.5 (H) 08/08/2020   Lab Results  Component Value Date   MICROALBUR 10 12/13/2020   LDLCALC 71 12/13/2020   CREATININE 0.91 03/16/2021   Lab Results  Component Value Date   NA 139 03/16/2021   K 4.3 03/16/2021   CREATININE 0.91 03/16/2021   EGFR 68 03/16/2021   GFRNONAA >60 08/13/2020   GLUCOSE 146 (H) 03/16/2021   Lab Results  Component Value Date   WBC 7.4 03/16/2021   HGB 12.9 03/16/2021   HCT 37.9 03/16/2021   MCV 97 03/16/2021   PLT 223 03/16/2021  -Controlled -Current medications: accu-chek softclix daily aviva plus test strips daily  Sitagliptin (PAP, yearly in December) -Medications previously tried: metformin  -Current home glucose readings fasting glucose: ~120-150 -Denies hypoglycemic/hyperglycemic symptoms -Current meal patterns:  breakfast: working to get protein and control sugar with options - eggs   Lunch and dinner: loves  spaghetti and trying to limit eating it -Current exercise: trying to exercise some -  having some shortness of breath or chest pain being evaluated by cardiologist.  -Educated onA1c and blood sugar goals; Complications of diabetes including kidney damage, retinal damage, and cardiovascular disease; Exercise goal of 150 minutes per week; Benefits of routine self-monitoring of blood sugar; Carbohydrate counting and/or plate method -Counseled to check feet daily and get yearly eye exams -Counseled on diet and exercise extensively September 2022: Will schedule f/u for December to start PAP process December 2022: Patient got PAP form in the mail and brought it into office. Coordinated with CCM team to determine if/when it was mailed out.  GERD  (Goal: control acid reflux symptoms ) -Controlled -Current treatment  famotidine 40 mg daily at bedtime  Omeprazole 40 mg daily -Medications previously tried: none reported -Recommended to continue current medication Counseled on triggers to flares. Patient is having some chest pain symptoms and being seen by cardiology this week. Doesn't feel that symptoms are related to acid reflux.   Constipation -Controlled -Current treatment  Amitiza 23mg -Medications previously tried: Linzess  - September 2022: Patient asked if she has to take this the rest of her life. I told her that some people do and some don't, the only way to know is to try. She has f/u with Dr. GLyndel Saferegarding  Constipation next month, she will ask doctor about doing a trial without at some point in the future when she's ready     Patient Goals/Self-Care Activities Over the next 180 days, patient will:  - take medications as prescribed focus on medication adherence by using pill box check glucose daily, document, and provide at future appointments check blood pressure daily, document, and provide at future appointments target a minimum of 150 minutes of moderate intensity exercise  weekly  Follow Up Plan: Telephone follow up appointment with care management team member scheduled for: August 2023  [place] Constable, Sherian Rein.D. - [phone]       Medication Assistance:  Januvia obtained through PAP medication assistance program.  Enrollment ends December  Compliance/Adherence/Medication fill history: Care Gaps: -Needs AWV  Star-Rating Drugs: Januvia Pravastatin Valsartan Diltiazem Chlorthalidone  Patient's preferred pharmacy is:  [facility] [place], [place] - [address] AT [place] [address] [place] [address] Phone: [phone] Fax: [phone]  [place], [place] [address] Phone: [phone] Fax: [phone]  [facility] ([place], [place] [facility] [place] [address] [place] [address] Phone: [phone] Fax: [phone]  [facility] ([facility] ) - [place], [place] [address] Phone: [phone] Fax: [phone]  Uses pill box? Yes Pt endorses 100% compliance  We discussed: Benefits of medication synchronization, packaging and delivery as well as enhanced pharmacist oversight with [employer]. Patient decided to:  Will go over [employer] at future visit  Care Plan and Follow Up Patient Decision:  Patient agrees to Care Plan and Follow-up.  Plan: The care management team will reach out to the patient again over the next 90 days.  Care Plan : [place]  Updates made by Lane Hacker, RPH since 04/03/2021 12:00 AM     Problem: dm, htn, hld, gerd   Priority: High  Onset Date: 06/28/2020     Long-Range Goal: disease management   Start Date: 06/28/2020  Expected End Date: 06/28/2021  Recent Progress: On track  Priority: High  Note:    Current Barriers:  Patient working to  increase exercise but dealing with shortness of breath and chest pain episodes.    Pharmacist Clinical Goal(s):  Over the next 90 days, patient will achieve control of chest pain episodes as evidenced by ability to exercise and avoid chest pain episodes.  through collaboration with PharmD and provider.   Interventions: 1:1 collaboration with Cox, Elnita Maxwell, MD regarding development and update of comprehensive plan of care as evidenced by provider attestation and co-signature Inter-disciplinary care team collaboration (see longitudinal plan of care) Comprehensive medication review performed; medication list updated in electronic medical record  Hypertension (BP goal <140/90) -Controlled -Current treatment: chlorthalidone 50 mg am and hs Metoprolol succinate 50 mg daily  Valsartan 320 mg daily  -Medications previously tried: ace inhibitors  -Current home readings:   01/04/21: Readings from past few days Checks Daily AM:  148/81 (Ate a lot of salty pasta) 127/69 125/78 133/68 -Current dietary habits: trying to eat healthier for diabetes control.  -Current exercise habits: working to increase and start working more  -Reports hypotensive/hypertensive symptoms -Educated on BP goals and benefits of medications for prevention of heart attack, stroke and kidney damage; Daily salt intake goal <  2300 mg; Exercise goal of 150 minutes per week; Importance of home blood pressure monitoring; -Counseled to monitor BP at home daily, document, and provide log at future appointments -Counseled on diet and exercise extensively Recommended to continue current medication September 2022: Extensive time spent on counseling patient on blood pressure goal and impact of each antihypertensive medication on their blood pressure and risk reduction for CV disease.  Used analogies to explain the need for multiple antihypertensive medications to achieve BP goals and that it is often a silent disease with no symptoms.     Hyperlipidemia: (LDL goal < 70) -Controlled -Current treatment: Pravastatin 40 mg daily at bedtime -Medications previously tried: none reported  -Current dietary patterns: healthier diet and working to increase protein in diet -Current exercise habits: working to increase activity.  -Educated on Cholesterol goals;  Benefits of statin for ASCVD risk reduction; Importance of limiting foods high in cholesterol; Exercise goal of 150 minutes per week; -Counseled on diet and exercise extensively September 2022: Patient open to trying high intensity statin  Diabetes (A1c goal <7%) Lab Results  Component Value Date   HGBA1C 6.6 (H) 03/16/2021   HGBA1C 6.6 (H) 12/13/2020   HGBA1C 6.5 (H) 08/08/2020   Lab Results  Component Value Date   MICROALBUR 10 12/13/2020   LDLCALC 71 12/13/2020   CREATININE 0.91 03/16/2021   Lab Results  Component Value Date   NA 139 03/16/2021   K 4.3 03/16/2021   CREATININE 0.91 03/16/2021   EGFR 68 03/16/2021   GFRNONAA >60 08/13/2020   GLUCOSE 146 (H) 03/16/2021   Lab Results  Component Value Date   WBC 7.4 03/16/2021   HGB 12.9 03/16/2021   HCT 37.9 03/16/2021   MCV 97 03/16/2021   PLT 223 03/16/2021  -Controlled -Current medications: accu-chek softclix daily aviva plus test strips daily  Sitagliptin (PAP, yearly in December) -Medications previously tried: metformin  -Current home glucose readings fasting glucose: ~120-150 -Denies hypoglycemic/hyperglycemic symptoms -Current meal patterns:  breakfast: working to get protein and control sugar with options - eggs   Lunch and dinner: loves spaghetti and trying to limit eating it -Current exercise: trying to exercise some -  having some shortness of breath or chest pain being evaluated by cardiologist.  -Educated onA1c and blood sugar goals; Complications of diabetes including kidney damage, retinal damage, and cardiovascular disease; Exercise goal of 150 minutes per week; Benefits of  routine self-monitoring of blood sugar; Carbohydrate counting and/or plate method -Counseled to check feet daily and get yearly eye exams -Counseled on diet and exercise extensively September 2022: Will schedule f/u for December to start PAP process December 2022: Patient got PAP form in the mail and brought it into office. Coordinated with CCM team to determine if/when it was mailed out.  GERD  (Goal: control acid reflux symptoms ) -Controlled -Current treatment  famotidine 40 mg daily at bedtime  Omeprazole 40 mg daily -Medications previously tried: none reported -Recommended to continue current medication Counseled on triggers to flares. Patient is having some chest pain symptoms and being seen by cardiology this week. Doesn't feel that symptoms are related to acid reflux.   Constipation -Controlled -Current treatment  Amitiza 39mg -Medications previously tried: Linzess  - September 2022: Patient asked if she has to take this the rest of her life. I told her that some people do and some don't, the only way to know is to try. She has f/u with Dr. GLyndel Saferegarding Constipation next month, she will ask doctor about  doing a trial without at some point in the future when she's ready     Patient Goals/Self-Care Activities Over the next 180 days, patient will:  - take medications as prescribed focus on medication adherence by using pill box check glucose daily, document, and provide at future appointments check blood pressure daily, document, and provide at future appointments target a minimum of 150 minutes of moderate intensity exercise weekly  Follow Up Plan: Telephone follow up appointment with care management team member scheduled for: August 2023  [place] Constable, Sherian Rein.D. - [phone]

## 2021-04-03 NOTE — Patient Instructions (Signed)
Visit Information   Goals Addressed   None    Patient Care Plan: CCM Pharmacy Care Plan     Problem Identified: dm, htn, hld, gerd   Priority: High  Onset Date: 06/28/2020     Long-Range Goal: disease management   Start Date: 06/28/2020  Expected End Date: 06/28/2021  Recent Progress: On track  Priority: High  Note:    Current Barriers:  Patient working to increase exercise but dealing with shortness of breath and chest pain episodes.    Pharmacist Clinical Goal(s):  Over the next 90 days, patient will achieve control of chest pain episodes as evidenced by ability to exercise and avoid chest pain episodes.  through collaboration with PharmD and provider.   Interventions: 1:1 collaboration with [facility], Elnita Maxwell, MD regarding development and update of comprehensive plan of care as evidenced by provider attestation and co-signature Inter-disciplinary care team collaboration (see longitudinal plan of care) Comprehensive medication review performed; medication list updated in electronic medical record  Hypertension (BP goal <140/90) -Controlled -Current treatment: chlorthalidone 50 mg am and hs Metoprolol succinate 50 mg daily  Valsartan 320 mg daily  -Medications previously tried: ace inhibitors  -Current home readings:   01/04/21: Readings from past few days Checks Daily AM:  148/81 (Ate a lot of salty pasta) 127/69 125/78 133/68 -Current dietary habits: trying to eat healthier for diabetes control.  -Current exercise habits: working to increase and start working more  -Reports hypotensive/hypertensive symptoms -Educated on BP goals and benefits of medications for prevention of heart attack, stroke and kidney damage; Daily salt intake goal < 2300 mg; Exercise goal of 150 minutes per week; Importance of home blood pressure monitoring; -Counseled to monitor BP at home daily, document, and provide log at future appointments -Counseled on diet and exercise extensively Recommended  to continue current medication September 2022: Extensive time spent on counseling patient on blood pressure goal and impact of each antihypertensive medication on their blood pressure and risk reduction for CV disease.  Used analogies to explain the need for multiple antihypertensive medications to achieve BP goals and that it is often a silent disease with no symptoms.    Hyperlipidemia: (LDL goal < 70) -Controlled -Current treatment: Pravastatin 40 mg daily at bedtime -Medications previously tried: none reported  -Current dietary patterns: healthier diet and working to increase protein in diet -Current exercise habits: working to increase activity.  -Educated on Cholesterol goals;  Benefits of statin for ASCVD risk reduction; Importance of limiting foods high in cholesterol; Exercise goal of 150 minutes per week; -Counseled on diet and exercise extensively September 2022: Patient open to trying high intensity statin  Diabetes (A1c goal <7%) Lab Results  Component Value Date   HGBA1C 6.6 (H) 03/16/2021   HGBA1C 6.6 (H) 12/13/2020   HGBA1C 6.5 (H) 08/08/2020   Lab Results  Component Value Date   MICROALBUR 10 12/13/2020   LDLCALC 71 12/13/2020   CREATININE 0.91 03/16/2021   Lab Results  Component Value Date   NA 139 03/16/2021   K 4.3 03/16/2021   CREATININE 0.91 03/16/2021   EGFR 68 03/16/2021   GFRNONAA >60 08/13/2020   GLUCOSE 146 (H) 03/16/2021   Lab Results  Component Value Date   WBC 7.4 03/16/2021   HGB 12.9 03/16/2021   HCT 37.9 03/16/2021   MCV 97 03/16/2021   PLT 223 03/16/2021  -Controlled -Current medications: accu-chek softclix daily aviva plus test strips daily  Sitagliptin (PAP, yearly in December) -Medications previously tried: metformin  -Current home  glucose readings fasting glucose: ~120-150 -Denies hypoglycemic/hyperglycemic symptoms -Current meal patterns:  breakfast: working to get protein and control sugar with options - eggs   Lunch  and dinner: loves spaghetti and trying to limit eating it -Current exercise: trying to exercise some -  having some shortness of breath or chest pain being evaluated by cardiologist.  -Educated onA1c and blood sugar goals; Complications of diabetes including kidney damage, retinal damage, and cardiovascular disease; Exercise goal of 150 minutes per week; Benefits of routine self-monitoring of blood sugar; Carbohydrate counting and/or plate method -Counseled to check feet daily and get yearly eye exams -Counseled on diet and exercise extensively September 2022: Will schedule f/u for December to start PAP process December 2022: Patient got PAP form in the mail and brought it into office. Coordinated with CCM team to determine if/when it was mailed out.  GERD  (Goal: control acid reflux symptoms ) -Controlled -Current treatment  famotidine 40 mg daily at bedtime  Omeprazole 40 mg daily -Medications previously tried: none reported -Recommended to continue current medication Counseled on triggers to flares. Patient is having some chest pain symptoms and being seen by cardiology this week. Doesn't feel that symptoms are related to acid reflux.   Constipation -Controlled -Current treatment  Amitiza 62mg -Medications previously tried: Linzess  - September 2022: Patient asked if she has to take this the rest of her life. I told her that some people do and some don't, the only way to know is to try. She has f/u with Dr. GLyndel Saferegarding Constipation next month, she will ask doctor about doing a trial without at some point in the future when she's ready     Patient Goals/Self-Care Activities Over the next 180 days, patient will:  - take medications as prescribed focus on medication adherence by using pill box check glucose daily, document, and provide at future appointments check blood pressure daily, document, and provide at future appointments target a minimum of 150 minutes of moderate  intensity exercise weekly  Follow Up Plan: Telephone follow up appointment with care management team member scheduled for: August 2023  NAri[place] Constable Pharm.D. -- [phone]     Ms. BDoxtaterwas given information about Chronic Care Management services today including:  CCM service includes personalized support from designated clinical staff supervised by her physician, including individualized plan of care and coordination with other care providers 24/7 contact phone numbers for assistance for urgent and routine care needs. Standard insurance, coinsurance, copays and deductibles apply for chronic care management only during months in which we provide at least 20 minutes of these services. Most insurances cover these services at 100%, however patients may be responsible for any copay, coinsurance and/or deductible if applicable. This service may help you avoid the need for more expensive face-to-face services. Only one practitioner may furnish and bill the service in a calendar month. The patient may stop CCM services at any time (effective at the end of the month) by phone call to the office staff.  Patient agreed to services and verbal consent obtained.   The patient verbalized understanding of instructions, educational materials, and care plan provided today and declined offer to receive copy of patient instructions, educational materials, and care plan.  The pharmacy team will reach out to the patient again over the next 90 days.   NLane Hacker [facility]

## 2021-04-07 DIAGNOSIS — R300 Dysuria: Secondary | ICD-10-CM | POA: Insufficient documentation

## 2021-04-07 HISTORY — DX: Dysuria: R30.0

## 2021-04-12 NOTE — Progress Notes (Signed)
Patient called me stating she still hasn't gotten PAP. After coordination with Felicity Coyer, "[employer] application was mailed on 11/18 by the office. Called [employer], they have received the application and mailed it back with attestation form for patient to sign and return to [employer] in order for them to process, patient has refills left and has never called for refills. Left message for patient regarding form that needs to be returned and if she is out of medication, left message of number for patient to call for refill to be shipped"  Called patient, she told me she has the number and will call immediately.

## 2021-04-18 ENCOUNTER — Other Ambulatory Visit: Payer: Self-pay

## 2021-04-18 ENCOUNTER — Ambulatory Visit (INDEPENDENT_AMBULATORY_CARE_PROVIDER_SITE_OTHER): Payer: Medicare Other | Admitting: Nurse Practitioner

## 2021-04-18 ENCOUNTER — Encounter: Payer: Self-pay | Admitting: Nurse Practitioner

## 2021-04-18 VITALS — BP 138/72 | HR 77 | Resp 18 | Ht 66.0 in | Wt 216.8 lb

## 2021-04-18 DIAGNOSIS — Z8619 Personal history of other infectious and parasitic diseases: Secondary | ICD-10-CM

## 2021-04-18 DIAGNOSIS — R8290 Unspecified abnormal findings in urine: Secondary | ICD-10-CM | POA: Diagnosis not present

## 2021-04-18 DIAGNOSIS — R309 Painful micturition, unspecified: Secondary | ICD-10-CM

## 2021-04-18 LAB — POCT URINALYSIS DIP (CLINITEK)
Bilirubin, UA: NEGATIVE
Blood, UA: NEGATIVE
Glucose, UA: NEGATIVE mg/dL
Ketones, POC UA: NEGATIVE mg/dL
Leukocytes, UA: NEGATIVE
Nitrite, UA: NEGATIVE
Spec Grav, UA: 1.015 (ref 1.010–1.025)
Urobilinogen, UA: NEGATIVE E.U./dL — AB
pH, UA: 6.5 (ref 5.0–8.0)

## 2021-04-18 MED ORDER — NITROFURANTOIN MONOHYD MACRO 100 MG PO CAPS: 100.0000 mg | ORAL_CAPSULE | Freq: Two times a day (BID) | ORAL | 0 refills | Status: DC

## 2021-04-18 MED ORDER — FLUCONAZOLE 150 MG PO TABS: 150.0000 mg | ORAL_TABLET | Freq: Once | ORAL | 0 refills | Status: AC

## 2021-04-18 NOTE — Patient Instructions (Addendum)
Take Macrobid twice daily for 7 days Push fluids, especially water Use Dove soap for bathing Take Diflucan after completing antibiotic Take probiotics and eat yogurt to prevent yeast infection Follow-up as needed   Urinary Tract Infection, Adult A urinary tract infection (UTI) is an infection of any part of the urinary tract. The urinary tract includes: The kidneys. The ureters. The bladder. The urethra. These organs make, store, and get rid of pee (urine) in the body. What are the causes? This infection is caused by germs (bacteria) in your genital area. These germs grow and cause swelling (inflammation) of your urinary tract. What increases the risk? The following factors may make you more likely to develop this condition: Using a small, thin tube (catheter) to drain pee. Not being able to control when you pee or poop (incontinence). Being female. If you are female, these things can increase the risk: Using these methods to prevent pregnancy: A medicine that kills sperm (spermicide). A device that blocks sperm (diaphragm). Having low levels of a female hormone (estrogen). Being pregnant. You are more likely to develop this condition if: You have genes that add to your risk. You are sexually active. You take antibiotic medicines. You have trouble peeing because of: A prostate that is bigger than normal, if you are female. A blockage in the part of your body that drains pee from the bladder. A kidney stone. A nerve condition that affects your bladder. Not getting enough to drink. Not peeing often enough. You have other conditions, such as: Diabetes. A weak disease-fighting system (immune system). Sickle cell disease. Gout. Injury of the spine. What are the signs or symptoms? Symptoms of this condition include: Needing to pee right away. Peeing small amounts often. Pain or burning when peeing. Blood in the pee. Pee that smells bad or not like normal. Trouble  peeing. Pee that is cloudy. Fluid coming from the vagina, if you are female. Pain in the belly or lower back. Other symptoms include: Vomiting. Not feeling hungry. Feeling mixed up (confused). This may be the first symptom in older adults. Being tired and grouchy (irritable). A fever. Watery poop (diarrhea). How is this treated? Taking antibiotic medicine. Taking other medicines. Drinking enough water. In some cases, you may need to see a specialist. Follow these instructions at home: Medicines Take over-the-counter and prescription medicines only as told by your doctor. If you were prescribed an antibiotic medicine, take it as told by your doctor. Do not stop taking it even if you start to feel better. General instructions Make sure you: Pee until your bladder is empty. Do not hold pee for a long time. Empty your bladder after sex. Wipe from front to back after peeing or pooping if you are a female. Use each tissue one time when you wipe. Drink enough fluid to keep your pee pale yellow. Keep all follow-up visits. Contact a doctor if: You do not get better after 1-2 days. Your symptoms go away and then come back. Get help right away if: You have very bad back pain. You have very bad pain in your lower belly. You have a fever. You have chills. You feeling like you will vomit or you vomit. Summary A urinary tract infection (UTI) is an infection of any part of the urinary tract. This condition is caused by germs in your genital area. There are many risk factors for a UTI. Treatment includes antibiotic medicines. Drink enough fluid to keep your pee pale yellow. This information is not intended  to replace advice given to you by your health care provider. Make sure you discuss any questions you have with your health care provider. Document Revised: 11/26/2019 Document Reviewed: 11/26/2019 Elsevier Patient Education  [place].

## 2021-04-18 NOTE — Progress Notes (Signed)
Acute Office Visit  Subjective:    Patient ID: Megan Galvan, female    DOB: 02/20/1951, 70 y.o.   MRN: [id number]  Chief Complaint  Patient presents with   Vaginal Injury   Dysuria    HPI: Patient is in today for vaginal itching, dysuria, and malodorous urine. Onset was 2-days ago. Treatment has included pushing fluids. Pt concerned symptoms would worsen over holiday weekend. Has requested prophylactic antibiotic. She was prescribed Premarin cream for atrophic vaginitis on 03/28/21 by PCP.  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   21 mm Edwards Resilia Inspiria Bioprosthetic Tissue Valve  SN [id number] Model 11500A   Sleep apnea    cpap    Past Surgical History:  Procedure Laterality Date   ANTERIOR CERVICAL DECOMP/DISCECTOMY FUSION     x 2   AORTIC VALVE REPLACEMENT N/A 08/10/2020   Procedure: AORTIC VALVE REPLACEMENT (AVR) USING INSPIRIS VALVE SIZE 21MM;  Surgeon: Rexene Alberts, MD;  Location: [facility];  Service: Open Heart Surgery;  Laterality: N/A;   BACK SURGERY  11/17/2016   L3-L5   BILATERAL CARPAL TUNNEL RELEASE     bone spur Left    left shoulder   bone spur Right    Right shoulder   COLONOSCOPY  08/22/2011   Moderate predominantly sigmoid diverticulosis. Small internal hemorrhoids.    COLONOSCOPY     HEMORROIDECTOMY  02/2019   NECK SURGERY     RIGHT/LEFT HEART CATH AND CORONARY ANGIOGRAPHY N/A 07/27/2020   Procedure: RIGHT/LEFT HEART CATH AND CORONARY ANGIOGRAPHY;  Surgeon: Sherren Mocha, MD;  Location: [facility];  Service: Cardiovascular;  Laterality: N/A;   TEE WITHOUT  CARDIOVERSION N/A 08/10/2020   Procedure: TRANSESOPHAGEAL ECHOCARDIOGRAM (TEE);  Surgeon: Rexene Alberts, MD;  Location: [facility];  Service: Open Heart Surgery;  Laterality: N/A;   torn rotator cuff Right    Right shoulder   torn rotator cuff Left    left shoulder   TRIGGER FINGER RELEASE Left    thumb   TRIGGER FINGER RELEASE Right    thumb and middle finger   UPPER GASTROINTESTINAL ENDOSCOPY     WRIST SURGERY Left    torn ligament   WRIST SURGERY Right    cyst    Family History  Problem Relation Age of Onset   Colon cancer Paternal Grandmother    Esophageal cancer Neg Hx    Rectal cancer Neg Hx    Stomach cancer Neg Hx    Breast cancer Neg Hx     Social History   Socioeconomic History   Marital status: Married    Spouse name: Not on file   Number of children: 2   Years of education: Not on file   Highest education level: Not on file  Occupational History   Not on file  Tobacco Use   Smoking status: Never   Smokeless tobacco: Never  Vaping Use   Vaping Use: Never used  Substance and Sexual Activity   Alcohol use: Never   Drug use: Never  Sexual activity: Not on file  Other Topics Concern   Not on file  Social History Narrative   Not on file   Social Determinants of Health   Financial Resource Strain: High Risk   Difficulty of Paying Living Expenses: Hard  Food Insecurity: Not on file  Transportation Needs: Not on file  Physical Activity: Not on file  Stress: Not on file  Social Connections: Not on file  Intimate Partner Violence: Not on file    Outpatient Medications Prior to Visit  Medication Sig Dispense Refill   Accu-Chek Softclix Lancets lancets      acetaminophen (TYLENOL) 500 MG tablet Take 1,000 mg by mouth every 6 (six) hours as needed for moderate pain or headache.     aspirin EC 81 MG tablet Take 1 tablet (81 mg total) by mouth daily. Swallow whole. 90 tablet 3   Blood Glucose Monitoring Suppl (ACCU-CHEK GUIDE ME) w/Device KIT Use as  directed 1 kit 0   Calcium Carb-Cholecalciferol (CALCIUM 600 + D PO) Take 2 tablets by mouth daily.     cetirizine (ZYRTEC) 10 MG tablet Take 10 mg by mouth daily.     chlorthalidone (HYGROTON) 50 MG tablet TAKE 1 TABLET BY MOUTH  DAILY 90 tablet 3   conjugated estrogens (PREMARIN) vaginal cream Place vaginally 2 (two) times a week. 42.5 g 2   diltiazem (CARDIZEM) 30 MG tablet Take 1 tablet (30 mg total) by mouth every 6 (six) hours as needed (If your heart rate is greater than 100.). 30 tablet 3   famotidine (PEPCID) 40 MG tablet TAKE 1 TABLET BY MOUTH AT  BEDTIME 90 tablet 3   fluticasone (CUTIVATE) 0.05 % cream Apply topically 2 (two) times daily. For 10 days to rectum 30 g 2   fluticasone (FLONASE) 50 MCG/ACT nasal spray Place 1 spray into the nose daily as needed for allergies.     glucose blood (ACCU-CHEK GUIDE) test strip Use as instructed 100 each 3   hydrocortisone (ANUSOL-HC) 25 MG suppository Place 1 suppository (25 mg total) rectally 2 (two) times daily. 30 suppository 2   ketoconazole (NIZORAL) 2 % cream Apply topically 2 (two) times daily as needed.     linaclotide (LINZESS) 145 MCG CAPS capsule Take 1 capsule (145 mcg total) by mouth daily before breakfast. 30 capsule 5   Melatonin 5 MG CAPS Take 5 mg by mouth at bedtime.     Multiple Vitamin (MULTIVITAMIN WITH MINERALS) TABS tablet Take 1 tablet by mouth daily.     omeprazole (PRILOSEC) 40 MG capsule TAKE 1 CAPSULE BY MOUTH  TWICE DAILY BEFORE MEALS 180 capsule 3   potassium chloride SA (KLOR-CON) 20 MEQ tablet TAKE 2 TABLETS BY MOUTH IN  THE MORNING AND 1 TABLET IN THE EVENING (Patient taking differently: 40 mEq 2 (two) times daily. TAKE 2 TABLETS BY MOUTH IN  THE MORNING AND  2 TABLETS  IN THE EVENING) 270 tablet 3   pravastatin (PRAVACHOL) 40 MG tablet TAKE 1 TABLET BY MOUTH AT  BEDTIME 90 tablet 3   PRESCRIPTION MEDICATION Apply 1 application topically 4 (four) times daily as needed (hemorrhoid). Nifedipine 5% ointment  Rx is  sent to [facility]. Last called in on 07/19/2020 with 1 yr of refills.     sitaGLIPtin (JANUVIA) 50 MG tablet Take 50 mg by mouth daily.     traZODone (DESYREL) 100 MG tablet TAKE 1 TABLET BY MOUTH  BEFORE BEDTIME 90 tablet 3   valsartan (DIOVAN) 320 MG tablet  TAKE 1 TABLET BY MOUTH  DAILY 90 tablet 3   No facility-administered medications prior to visit.    Allergies  Allergen Reactions   Ace Inhibitors Cough   Farxiga [Dapagliflozin]     Yeast infections    Jardiance [Empagliflozin]     Yeast infections   Keflex [Cephalexin] Itching   Latex     Burn and itch   Nexium [Esomeprazole Magnesium] Diarrhea   Protonix [Pantoprazole Sodium] Other (See Comments)    Constipation   Doxycycline Rash    Review of Systems  Constitutional:  Negative for chills, diaphoresis, fatigue and fever.  Gastrointestinal:  Negative for abdominal pain, diarrhea and nausea.  Genitourinary:  Positive for dysuria. Negative for difficulty urinating, hematuria, pelvic pain, urgency and vaginal pain.       Malodorous urine; vaginal itching  All other systems reviewed and are negative.     Objective:    Physical Exam Vitals reviewed.  Constitutional:      Appearance: Normal appearance.  Pulmonary:     Effort: Pulmonary effort is normal.  Abdominal:     General: Bowel sounds are normal. There is no distension.     Palpations: Abdomen is soft. There is no mass.     Tenderness: There is no abdominal tenderness. There is no right CVA tenderness or left CVA tenderness.  Skin:    General: Skin is warm and dry.     Capillary Refill: Capillary refill takes less than 2 seconds.  Neurological:     General: No focal deficit present.     Mental Status: She is alert and oriented to person, place, and time.  Psychiatric:        Mood and Affect: Mood normal.        Behavior: Behavior normal.    BP 138/72    Pulse 77    Resp 18    Ht '5\' 6"'  (1.676 m)    Wt 216 lb 12.8 oz (98.3 kg)    SpO2 97%    BMI  34.99 kg/m  Wt Readings from Last 3 Encounters:  04/18/21 216 lb 12.8 oz (98.3 kg)  03/27/21 214 lb (97.1 kg)  03/16/21 214 lb (97.1 kg)    Health Maintenance Due  Topic Date Due   Hepatitis C Screening  Never done   Zoster Vaccines- Shingrix (1 of 2) Never done   Pneumonia Vaccine 3+ Years old (3 - PPSV23 if available, else PCV20) 04/13/2017   COVID-19 Vaccine (3 - Moderna risk series) 03/16/2021   OPHTHALMOLOGY EXAM  03/20/2021       Lab Results  Component Value Date   TSH 1.180 03/16/2021   Lab Results  Component Value Date   WBC 7.4 03/16/2021   HGB 12.9 03/16/2021   HCT 37.9 03/16/2021   MCV 97 03/16/2021   PLT 223 03/16/2021   Lab Results  Component Value Date   NA 139 03/16/2021   K 4.3 03/16/2021   CO2 29 03/16/2021   GLUCOSE 146 (H) 03/16/2021   BUN 15 03/16/2021   CREATININE 0.91 03/16/2021   BILITOT 0.5 03/16/2021   ALKPHOS 66 03/16/2021   AST 33 03/16/2021   ALT 32 03/16/2021   PROT 7.6 03/16/2021   ALBUMIN 4.6 03/16/2021   CALCIUM 10.0 03/16/2021   ANIONGAP 10 08/13/2020   EGFR 68 03/16/2021   Lab Results  Component Value Date   CHOL 143 12/13/2020   Lab Results  Component Value Date   HDL 55 12/13/2020   Lab Results  Component Value Date   LDLCALC 71 12/13/2020   Lab Results  Component Value Date   TRIG 89 12/13/2020   Lab Results  Component Value Date   CHOLHDL 2.6 12/13/2020   Lab Results  Component Value Date   HGBA1C 6.6 (H) 03/16/2021       Assessment & Plan:    1. Pain with urination - POCT URINALYSIS DIP (CLINITEK)  2. Malodorous urine - nitrofurantoin, macrocrystal-monohydrate, (MACROBID) 100 MG capsule; Take 1 capsule (100 mg total) by mouth 2 (two) times daily.  Dispense: 14 capsule; Refill: 0  3. History of candidiasis of vagina - fluconazole (DIFLUCAN) 150 MG tablet; Take 1 tablet (150 mg total) by mouth once for 1 dose.  Dispense: 1 tablet; Refill: 0    Take Macrobid twice daily for 7 days Push  fluids, especially water Use Dove soap for bathing Take Diflucan after completing antibiotic Take probiotics and eat yogurt to prevent yeast infection Follow-up as needed  Follow-up: PRN  An After Visit Summary was printed and given to the patient.  I, Rip Harbour, NP, have reviewed all documentation for this visit. The documentation on 04/18/21 for the exam, diagnosis, procedures, and orders are all accurate and complete.    Signed, Rip Harbour, NP [place] [phone]

## 2021-04-28 DIAGNOSIS — E782 Mixed hyperlipidemia: Secondary | ICD-10-CM

## 2021-04-28 DIAGNOSIS — E1142 Type 2 diabetes mellitus with diabetic polyneuropathy: Secondary | ICD-10-CM | POA: Diagnosis not present

## 2021-04-28 DIAGNOSIS — I10 Essential (primary) hypertension: Secondary | ICD-10-CM

## 2021-04-28 DIAGNOSIS — J069 Acute upper respiratory infection, unspecified: Secondary | ICD-10-CM | POA: Diagnosis not present

## 2021-05-14 ENCOUNTER — Other Ambulatory Visit: Payer: Self-pay

## 2021-05-14 ENCOUNTER — Telehealth: Payer: Self-pay

## 2021-05-14 NOTE — Telephone Encounter (Signed)
I reached out to patient for our scheduled AWV Telephone appointment - no answer/no vm.

## 2021-05-22 DIAGNOSIS — G4733 Obstructive sleep apnea (adult) (pediatric): Secondary | ICD-10-CM | POA: Diagnosis not present

## 2021-05-29 ENCOUNTER — Telehealth: Payer: Self-pay

## 2021-05-29 NOTE — Telephone Encounter (Signed)
Meredith Mody told me patient having trouble renewing Januvia. Called patient and she told me someone from office had to call [phone]. While on hold, got msg from [place] saying she got things approved.

## 2021-06-14 ENCOUNTER — Other Ambulatory Visit: Payer: Self-pay

## 2021-06-14 ENCOUNTER — Ambulatory Visit (INDEPENDENT_AMBULATORY_CARE_PROVIDER_SITE_OTHER): Payer: Medicare Other | Admitting: Family Medicine

## 2021-06-14 VITALS — BP 120/78 | HR 78 | Temp 97.1°F | Resp 16 | Ht 65.0 in | Wt 217.0 lb

## 2021-06-14 DIAGNOSIS — B3789 Other sites of candidiasis: Secondary | ICD-10-CM | POA: Diagnosis not present

## 2021-06-14 MED ORDER — FLUCONAZOLE 150 MG PO TABS: 150.0000 mg | ORAL_TABLET | Freq: Every day | ORAL | 1 refills | Status: AC

## 2021-06-14 MED ORDER — CLOTRIMAZOLE-BETAMETHASONE 1-0.05 % EX CREA: 1.0000 "application " | TOPICAL_CREAM | Freq: Every day | CUTANEOUS | 1 refills | Status: DC

## 2021-06-14 NOTE — Progress Notes (Signed)
Acute Office Visit  Subjective:    Patient ID: Megan Galvan, female    DOB: 02-Feb-1951, 71 y.o.   MRN: [id number]  Chief Complaint  Patient presents with   Rectal Pain   Rectal Bleeding    HPI: Patient is in today for rectal pain and rectal bleeding.  Her symptoms started about 4-5 days ago with symptoms worse over the last 48 hours.   Painful and itchy in anus when bowel movement passes. Hurts then all day. Normal stools, but had 4 times a day. Taking [place], which has helped with constipation. BRBPR. Fluticasone 0.05% cream given by Dr. Lyndel Safe is not helping at all.   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   21 mm Edwards Resilia Inspiria Bioprosthetic Tissue Valve  SN [id number] Model 11500A   Sleep apnea    cpap    Past Surgical History:  Procedure Laterality Date   ANTERIOR CERVICAL DECOMP/DISCECTOMY FUSION     x 2   AORTIC VALVE REPLACEMENT N/A 08/10/2020   Procedure: AORTIC VALVE REPLACEMENT (AVR) USING INSPIRIS VALVE SIZE 21MM;  Surgeon: Rexene Alberts, MD;  Location: [facility];  Service: Open Heart Surgery;  Laterality: N/A;   BACK SURGERY  11/17/2016   L3-L5   BILATERAL CARPAL TUNNEL RELEASE     bone spur Left    left shoulder   bone spur Right    Right shoulder   COLONOSCOPY  08/22/2011   Moderate predominantly sigmoid diverticulosis. Small internal hemorrhoids.    COLONOSCOPY     HEMORROIDECTOMY  02/2019   NECK SURGERY     RIGHT/LEFT HEART CATH AND CORONARY ANGIOGRAPHY N/A 07/27/2020   Procedure: RIGHT/LEFT HEART CATH AND CORONARY ANGIOGRAPHY;  Surgeon: Sherren Mocha, MD;  Location: [facility];  Service: Cardiovascular;  Laterality: N/A;   TEE WITHOUT CARDIOVERSION N/A 08/10/2020   Procedure: TRANSESOPHAGEAL ECHOCARDIOGRAM (TEE);  Surgeon: Rexene Alberts, MD;  Location: [facility];  Service: Open Heart Surgery;  Laterality: N/A;   torn rotator cuff Right    Right shoulder   torn rotator cuff Left    left shoulder   TRIGGER FINGER RELEASE Left    thumb   TRIGGER FINGER RELEASE Right    thumb and middle finger   UPPER GASTROINTESTINAL ENDOSCOPY     WRIST SURGERY Left    torn ligament   WRIST SURGERY Right    cyst    Family History  Problem Relation Age of Onset   Colon cancer Paternal Grandmother    Esophageal cancer Neg Hx    Rectal cancer Neg Hx    Stomach cancer Neg Hx    Breast cancer Neg Hx     Social History   Socioeconomic History   Marital status: Married    Spouse name: Not on file   Number of children: 2   Years of education: Not on file   Highest education level: Not on file  Occupational History   Not on file  Tobacco Use   Smoking status: Never   Smokeless tobacco:  Never  Vaping Use   Vaping Use: Never used  Substance and Sexual Activity   Alcohol use: Never   Drug use: Never   Sexual activity: Not on file  Other Topics Concern   Not on file  Social History Narrative   Not on file   Social Determinants of Health   Financial Resource Strain: High Risk   Difficulty of Paying Living Expenses: Hard  Food Insecurity: Not on file  Transportation Needs: Not on file  Physical Activity: Not on file  Stress: Not on file  Social Connections: Not on file  Intimate Partner Violence: Not on file    Outpatient Medications Prior to Visit  Medication Sig Dispense Refill   Accu-Chek Softclix Lancets lancets      acetaminophen (TYLENOL) 500 MG tablet Take 1,000 mg by mouth every 6 (six) hours as needed for moderate pain or headache.     aspirin EC 81 MG tablet Take 1 tablet (81 mg total) by mouth daily. Swallow whole. 90 tablet 3    Blood Glucose Monitoring Suppl (ACCU-CHEK GUIDE ME) w/Device KIT Use as directed 1 kit 0   Calcium Carb-Cholecalciferol (CALCIUM 600 + D PO) Take 2 tablets by mouth daily.     cetirizine (ZYRTEC) 10 MG tablet Take 10 mg by mouth daily.     chlorthalidone (HYGROTON) 50 MG tablet TAKE 1 TABLET BY MOUTH  DAILY 90 tablet 3   conjugated estrogens (PREMARIN) vaginal cream Place vaginally 2 (two) times a week. 42.5 g 2   diltiazem (CARDIZEM) 30 MG tablet Take 1 tablet (30 mg total) by mouth every 6 (six) hours as needed (If your heart rate is greater than 100.). 30 tablet 3   famotidine (PEPCID) 40 MG tablet TAKE 1 TABLET BY MOUTH AT  BEDTIME 90 tablet 3   fluticasone (CUTIVATE) 0.05 % cream Apply topically 2 (two) times daily. For 10 days to rectum 30 g 2   fluticasone (FLONASE) 50 MCG/ACT nasal spray Place 1 spray into the nose daily as needed for allergies.     glucose blood (ACCU-CHEK GUIDE) test strip Use as instructed 100 each 3   hydrocortisone (ANUSOL-HC) 25 MG suppository Place 1 suppository (25 mg total) rectally 2 (two) times daily. 30 suppository 2   ketoconazole (NIZORAL) 2 % cream Apply topically 2 (two) times daily as needed.     linaclotide (LINZESS) 145 MCG CAPS capsule Take 1 capsule (145 mcg total) by mouth daily before breakfast. 30 capsule 5   Melatonin 5 MG CAPS Take 5 mg by mouth at bedtime.     Multiple Vitamin (MULTIVITAMIN WITH MINERALS) TABS tablet Take 1 tablet by mouth daily.     nitrofurantoin, macrocrystal-monohydrate, (MACROBID) 100 MG capsule Take 1 capsule (100 mg total) by mouth 2 (two) times daily. 14 capsule 0   omeprazole (PRILOSEC) 40 MG capsule TAKE 1 CAPSULE BY MOUTH  TWICE DAILY BEFORE MEALS 180 capsule 3   potassium chloride SA (KLOR-CON) 20 MEQ tablet TAKE 2 TABLETS BY MOUTH IN  THE MORNING AND 1 TABLET IN THE EVENING (Patient taking differently: 40 mEq 2 (two) times daily. TAKE 2 TABLETS BY MOUTH IN  THE MORNING AND  2 TABLETS  IN THE EVENING) 270 tablet 3    pravastatin (PRAVACHOL) 40 MG tablet TAKE 1 TABLET BY MOUTH AT  BEDTIME 90 tablet 3   PRESCRIPTION MEDICATION Apply 1 application topically 4 (four) times daily as needed (hemorrhoid). Nifedipine 5% ointment  Rx is sent to [facility]. Last called  in on 07/19/2020 with 1 yr of refills.     sitaGLIPtin (JANUVIA) 50 MG tablet Take 50 mg by mouth daily.     traZODone (DESYREL) 100 MG tablet TAKE 1 TABLET BY MOUTH  BEFORE BEDTIME 90 tablet 3   valsartan (DIOVAN) 320 MG tablet TAKE 1 TABLET BY MOUTH  DAILY 90 tablet 3   No facility-administered medications prior to visit.    Allergies  Allergen Reactions   Ace Inhibitors Cough   Farxiga [Dapagliflozin]     Yeast infections    Jardiance [Empagliflozin]     Yeast infections   Keflex [Cephalexin] Itching   Latex     Burn and itch   Nexium [Esomeprazole Magnesium] Diarrhea   Protonix [Pantoprazole Sodium] Other (See Comments)    Constipation   Doxycycline Rash    Review of Systems  Constitutional:  Positive for fatigue. Negative for chills and fever.  HENT:  Negative for congestion, ear pain and sore throat.   Respiratory:  Negative for cough and shortness of breath.   Cardiovascular:  Negative for chest pain and palpitations.  Gastrointestinal:  Positive for abdominal pain, anal bleeding, blood in stool and rectal pain. Negative for diarrhea (soft stools x 4 today).  Genitourinary:  Negative for dysuria.  Musculoskeletal:  Negative for arthralgias, back pain and myalgias.  Neurological:  Negative for dizziness and headaches.  Psychiatric/Behavioral:  Positive for dysphoric mood. The patient is not nervous/anxious.       Objective:    Physical Exam Vitals reviewed.  Constitutional:      Appearance: Normal appearance.  Genitourinary:      Comments: Anal redness. No hemorrhoids. No fissure. Tender.  Neurological:     Mental Status: Megan Galvan is alert.    BP 120/78    Pulse 78    Temp (!) 97.1 F (36.2 C)    Resp 16    Ht  '5\' 5"'  (1.651 m)    Wt 217 lb (98.4 kg)    BMI 36.11 kg/m  Wt Readings from Last 3 Encounters:  06/14/21 217 lb (98.4 kg)  04/18/21 216 lb 12.8 oz (98.3 kg)  03/27/21 214 lb (97.1 kg)    Health Maintenance Due  Topic Date Due   Hepatitis C Screening  Never done   Zoster Vaccines- Shingrix (1 of 2) Never done   Pneumonia Vaccine 91+ Years old (3) 04/13/2017   COVID-19 Vaccine (3 - Moderna risk series) 03/16/2021   OPHTHALMOLOGY EXAM  03/20/2021    There are no preventive care reminders to display for this patient.   Lab Results  Component Value Date   TSH 1.180 03/16/2021   Lab Results  Component Value Date   WBC 7.4 03/16/2021   HGB 12.9 03/16/2021   HCT 37.9 03/16/2021   MCV 97 03/16/2021   PLT 223 03/16/2021   Lab Results  Component Value Date   NA 139 03/16/2021   K 4.3 03/16/2021   CO2 29 03/16/2021   GLUCOSE 146 (H) 03/16/2021   BUN 15 03/16/2021   CREATININE 0.91 03/16/2021   BILITOT 0.5 03/16/2021   ALKPHOS 66 03/16/2021   AST 33 03/16/2021   ALT 32 03/16/2021   PROT 7.6 03/16/2021   ALBUMIN 4.6 03/16/2021   CALCIUM 10.0 03/16/2021   ANIONGAP 10 08/13/2020   EGFR 68 03/16/2021   Lab Results  Component Value Date   CHOL 143 12/13/2020   Lab Results  Component Value Date   HDL 55 12/13/2020   Lab Results  Component Value  Date   LDLCALC 71 12/13/2020   Lab Results  Component Value Date   TRIG 89 12/13/2020   Lab Results  Component Value Date   CHOLHDL 2.6 12/13/2020   Lab Results  Component Value Date   HGBA1C 6.6 (H) 03/16/2021       Assessment & Plan:   Problem List Items Addressed This Visit       Digestive   Candidiasis of anus - Primary    Fluconazole 150 mg once daily x 3 days 3/1. Lotrisone cream prescribed.       Relevant Medications   fluconazole (DIFLUCAN) 150 MG tablet   clotrimazole-betamethasone (LOTRISONE) cream   Meds ordered this encounter  Medications   fluconazole (DIFLUCAN) 150 MG tablet    Sig: Take 1  tablet (150 mg total) by mouth daily for 3 days.    Dispense:  3 tablet    Refill:  1   clotrimazole-betamethasone (LOTRISONE) cream    Sig: Apply 1 application topically daily.    Dispense:  45 g    Refill:  1   Follow-up: Return if symptoms worsen or fail to improve.  An After Visit Summary was printed and given to the patient.  Rochel Brome, MD [facility] [phone]

## 2021-06-16 ENCOUNTER — Encounter: Payer: Self-pay | Admitting: Family Medicine

## 2021-06-16 DIAGNOSIS — B3789 Other sites of candidiasis: Secondary | ICD-10-CM | POA: Insufficient documentation

## 2021-06-16 NOTE — Assessment & Plan Note (Signed)
Fluconazole 150 mg once daily x 3 days 3/1. Lotrisone cream prescribed.

## 2021-06-20 ENCOUNTER — Telehealth: Payer: Self-pay

## 2021-06-20 NOTE — Chronic Care Management (AMB) (Signed)
Chronic Care Management Pharmacy Assistant   Name: MARKELA WEE  MRN: [id number] DOB: 04-04-1951   Reason for Encounter: Disease State call for DM    Recent office visits:  06/14/21 Rochel Brome MD. Seen for Rectal Pain. Started on Clotrimazole-Betamethasone and Fluconazole 117m.   04/18/21 HJerrell BelfastNP. Seen for Malodorous Urine. Started on Fluconazole 1522mand Macrobid 10074m  Recent consult visits:  None  Hospital visits:  None  Medications: Outpatient Encounter Medications as of 06/20/2021  Medication Sig   Accu-Chek Softclix Lancets lancets    acetaminophen (TYLENOL) 500 MG tablet Take 1,000 mg by mouth every 6 (six) hours as needed for moderate pain or headache.   aspirin EC 81 MG tablet Take 1 tablet (81 mg total) by mouth daily. Swallow whole.   Blood Glucose Monitoring Suppl (ACCU-CHEK GUIDE ME) w/Device KIT Use as directed   Calcium Carb-Cholecalciferol (CALCIUM 600 + D PO) Take 2 tablets by mouth daily.   cetirizine (ZYRTEC) 10 MG tablet Take 10 mg by mouth daily.   chlorthalidone (HYGROTON) 50 MG tablet TAKE 1 TABLET BY MOUTH  DAILY   clotrimazole-betamethasone (LOTRISONE) cream Apply 1 application topically daily.   conjugated estrogens (PREMARIN) vaginal cream Place vaginally 2 (two) times a week.   diltiazem (CARDIZEM) 30 MG tablet Take 1 tablet (30 mg total) by mouth every 6 (six) hours as needed (If your heart rate is greater than 100.).   famotidine (PEPCID) 40 MG tablet TAKE 1 TABLET BY MOUTH AT  BEDTIME   fluticasone (CUTIVATE) 0.05 % cream Apply topically 2 (two) times daily. For 10 days to rectum   fluticasone (FLONASE) 50 MCG/ACT nasal spray Place 1 spray into the nose daily as needed for allergies.   glucose blood (ACCU-CHEK GUIDE) test strip Use as instructed   hydrocortisone (ANUSOL-HC) 25 MG suppository Place 1 suppository (25 mg total) rectally 2 (two) times daily.   ketoconazole (NIZORAL) 2 % cream Apply topically 2 (two) times daily as  needed.   linaclotide (LINZESS) 145 MCG CAPS capsule Take 1 capsule (145 mcg total) by mouth daily before breakfast.   Melatonin 5 MG CAPS Take 5 mg by mouth at bedtime.   Multiple Vitamin (MULTIVITAMIN WITH MINERALS) TABS tablet Take 1 tablet by mouth daily.   nitrofurantoin, macrocrystal-monohydrate, (MACROBID) 100 MG capsule Take 1 capsule (100 mg total) by mouth 2 (two) times daily.   omeprazole (PRILOSEC) 40 MG capsule TAKE 1 CAPSULE BY MOUTH  TWICE DAILY BEFORE MEALS   potassium chloride SA (KLOR-CON) 20 MEQ tablet TAKE 2 TABLETS BY MOUTH IN  THE MORNING AND 1 TABLET IN THE EVENING (Patient taking differently: 40 mEq 2 (two) times daily. TAKE 2 TABLETS BY MOUTH IN  THE MORNING AND  2 TABLETS  IN THE EVENING)   pravastatin (PRAVACHOL) 40 MG tablet TAKE 1 TABLET BY MOUTH AT  BEDTIME   PRESCRIPTION MEDICATION Apply 1 application topically 4 (four) times daily as needed (hemorrhoid). Nifedipine 5% ointment  [facility] on 07/19/2020 with 1 yr of refills.   sitaGLIPtin (JANUVIA) 50 MG tablet Take 50 mg by mouth daily.   traZODone (DESYREL) 100 MG tablet TAKE 1 TABLET BY MOUTH  BEFORE BEDTIME   valsartan (DIOVAN) 320 MG tablet TAKE 1 TABLET BY MOUTH  DAILY   No facility-administered encounter medications on file as of 06/20/2021.    Recent Relevant Labs: Lab Results  Component Value Date/Time   HGBA1C 6.6 (H) 03/16/2021 10:28  AM   HGBA1C 6.6 (H) 12/13/2020 10:44 AM   MICROALBUR 10 12/13/2020 10:44 AM   MICROALBUR 10 05/09/2020 11:32 AM    Kidney Function Lab Results  Component Value Date/Time   CREATININE 0.91 03/16/2021 10:28 AM   CREATININE 0.89 12/13/2020 10:44 AM   GFRNONAA >60 08/13/2020 05:29 AM   GFRAA 84 05/09/2020 11:32 AM     Current antihyperglycemic regimen:  Januvia 20m daily  Patient verbally confirms she is taking the above medications as directed. Yes  What recent interventions/DTPs have been made to improve glycemic  control:  Pt denies any changes   Have there been any recent hospitalizations or ED visits since last visit with CPP? No  Patient denies hypoglycemic symptoms  Patient denies hyperglycemic symptoms  How often are you checking your blood sugar? once daily  What are your blood sugars ranging?  Fasting: 143, 146, 148  On insulin? No  During the week, how often does your blood glucose drop below 70? Never  Are you checking your feet daily/regularly? Yes  Adherence Review: Is the patient currently on a STATIN medication? Yes Is the patient currently on ACE/ARB medication? Yes Does the patient have >5 day gap between last estimated fill dates? CPP to review  Care Gaps: Last eye exam / Retinopathy Screening? 03/20/20 Last Annual Wellness Visit? None noted  Last Diabetic Foot Exam? 09/11/20   Star Rating Drugs:  Medication:  Last Fill: Day Supply Januvia   05/31/21  [place] CElmoPharmacist Assistant  [phone]

## 2021-07-04 ENCOUNTER — Ambulatory Visit (INDEPENDENT_AMBULATORY_CARE_PROVIDER_SITE_OTHER): Payer: Medicare Other | Admitting: Family Medicine

## 2021-07-04 ENCOUNTER — Encounter: Payer: Self-pay | Admitting: Family Medicine

## 2021-07-04 ENCOUNTER — Other Ambulatory Visit: Payer: Self-pay

## 2021-07-04 VITALS — BP 130/62 | HR 72 | Temp 97.2°F | Ht 65.0 in | Wt 219.0 lb

## 2021-07-04 DIAGNOSIS — K6389 Other specified diseases of intestine: Secondary | ICD-10-CM | POA: Diagnosis not present

## 2021-07-04 DIAGNOSIS — K602 Anal fissure, unspecified: Secondary | ICD-10-CM | POA: Insufficient documentation

## 2021-07-04 MED ORDER — SULFAMETHOXAZOLE-TRIMETHOPRIM 800-160 MG PO TABS: 1.0000 | ORAL_TABLET | Freq: Two times a day (BID) | ORAL | 0 refills | Status: DC

## 2021-07-04 MED ORDER — NITROGLYCERIN 0.4 % RE OINT: TOPICAL_OINTMENT | RECTAL | 1 refills | Status: DC

## 2021-07-04 NOTE — Progress Notes (Signed)
Acute Office Visit  Subjective:    Patient ID: Megan Galvan, female    DOB: 12/30/1950, 71 y.o.   MRN: [id number]  Chief Complaint  Patient presents with   Ongoing Rectal issues    HPI Patient is in today for rectal irritation/burning. I treated her for a yeast infection 2-3 weeks ago with diflucan and cltrimazole/betamethasone diproprionate 1%/0.05%. helped a little, but then cream started burning. Was using fluticasone cream 0.05% from Dr. Lyndel Safe, but did not help.  Has used dibucaine ointment 1% which did not help.  Hydrocortisone cream 2.5 % did not help.  Nifedipine 5% ointment a year ago and it may have helped. It is hard to remember.   Using miralax and probiotic. Eats activia yogurt.  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   21 mm Edwards Resilia Inspiria Bioprosthetic Tissue Valve  SN [id number] Model 11500A   Sleep apnea    cpap    Past Surgical History:  Procedure Laterality Date   ANTERIOR CERVICAL DECOMP/DISCECTOMY FUSION     x 2   AORTIC VALVE REPLACEMENT N/A 08/10/2020   Procedure: AORTIC VALVE REPLACEMENT (AVR) USING INSPIRIS VALVE SIZE 21MM;  Surgeon: Rexene Alberts, MD;  Location: [place];  Service: Open Heart Surgery;  Laterality: N/A;   BACK SURGERY  11/17/2016   L3-L5   BILATERAL CARPAL TUNNEL RELEASE     bone spur Left    left shoulder   bone spur Right    Right shoulder   COLONOSCOPY  08/22/2011   Moderate predominantly sigmoid diverticulosis. Small internal hemorrhoids.    COLONOSCOPY     HEMORROIDECTOMY  02/2019   NECK SURGERY     RIGHT/LEFT HEART CATH AND CORONARY  ANGIOGRAPHY N/A 07/27/2020   Procedure: RIGHT/LEFT HEART CATH AND CORONARY ANGIOGRAPHY;  Surgeon: Sherren Mocha, MD;  Location: [facility];  Service: Cardiovascular;  Laterality: N/A;   TEE WITHOUT CARDIOVERSION N/A 08/10/2020   Procedure: TRANSESOPHAGEAL ECHOCARDIOGRAM (TEE);  Surgeon: Rexene Alberts, MD;  Location: [place];  Service: Open Heart Surgery;  Laterality: N/A;   torn rotator cuff Right    Right shoulder   torn rotator cuff Left    left shoulder   TRIGGER FINGER RELEASE Left    thumb   TRIGGER FINGER RELEASE Right    thumb and middle finger   UPPER GASTROINTESTINAL ENDOSCOPY     WRIST SURGERY Left    torn ligament   WRIST SURGERY Right    cyst    Family History  Problem Relation Age of Onset   Colon cancer Paternal Grandmother    Esophageal cancer Neg Hx    Rectal cancer Neg Hx    Stomach cancer Neg Hx    Breast cancer Neg Hx     Social History   Socioeconomic History   Marital status: Married    Spouse name: Not on file   Number of children: 2   Years of education: Not on file   Highest education level: Not on file  Occupational History   Not  on file  Tobacco Use   Smoking status: Never   Smokeless tobacco: Never  Vaping Use   Vaping Use: Never used  Substance and Sexual Activity   Alcohol use: Never   Drug use: Never   Sexual activity: Not on file  Other Topics Concern   Not on file  Social History Narrative   Not on file   Social Determinants of Health   Financial Resource Strain: High Risk   Difficulty of Paying Living Expenses: Hard  Food Insecurity: Not on file  Transportation Needs: Not on file  Physical Activity: Not on file  Stress: Not on file  Social Connections: Not on file  Intimate Partner Violence: Not on file    Outpatient Medications Prior to Visit  Medication Sig Dispense Refill   Accu-Chek Softclix Lancets lancets      acetaminophen (TYLENOL) 500 MG tablet Take 1,000 mg by mouth every 6 (six) hours as needed for  moderate pain or headache.     aspirin EC 81 MG tablet Take 1 tablet (81 mg total) by mouth daily. Swallow whole. 90 tablet 3   Blood Glucose Monitoring Suppl (ACCU-CHEK GUIDE ME) w/Device KIT Use as directed 1 kit 0   Calcium Carb-Cholecalciferol (CALCIUM 600 + D PO) Take 2 tablets by mouth daily.     cetirizine (ZYRTEC) 10 MG tablet Take 10 mg by mouth daily.     chlorthalidone (HYGROTON) 50 MG tablet TAKE 1 TABLET BY MOUTH  DAILY 90 tablet 3   clotrimazole-betamethasone (LOTRISONE) cream Apply 1 application topically daily. 45 g 1   conjugated estrogens (PREMARIN) vaginal cream Place vaginally 2 (two) times a week. 42.5 g 2   diltiazem (CARDIZEM) 30 MG tablet Take 1 tablet (30 mg total) by mouth every 6 (six) hours as needed (If your heart rate is greater than 100.). 30 tablet 3   famotidine (PEPCID) 40 MG tablet TAKE 1 TABLET BY MOUTH AT  BEDTIME 90 tablet 3   fluticasone (CUTIVATE) 0.05 % cream Apply topically 2 (two) times daily. For 10 days to rectum 30 g 2   fluticasone (FLONASE) 50 MCG/ACT nasal spray Place 1 spray into the nose daily as needed for allergies.     glucose blood (ACCU-CHEK GUIDE) test strip Use as instructed 100 each 3   hydrocortisone (ANUSOL-HC) 25 MG suppository Place 1 suppository (25 mg total) rectally 2 (two) times daily. 30 suppository 2   ketoconazole (NIZORAL) 2 % cream Apply topically 2 (two) times daily as needed.     linaclotide (LINZESS) 145 MCG CAPS capsule Take 1 capsule (145 mcg total) by mouth daily before breakfast. 30 capsule 5   Melatonin 5 MG CAPS Take 5 mg by mouth at bedtime.     Multiple Vitamin (MULTIVITAMIN WITH MINERALS) TABS tablet Take 1 tablet by mouth daily.     nitrofurantoin, macrocrystal-monohydrate, (MACROBID) 100 MG capsule Take 1 capsule (100 mg total) by mouth 2 (two) times daily. 14 capsule 0   omeprazole (PRILOSEC) 40 MG capsule TAKE 1 CAPSULE BY MOUTH  TWICE DAILY BEFORE MEALS 180 capsule 3   potassium chloride SA (KLOR-CON) 20 MEQ  tablet TAKE 2 TABLETS BY MOUTH IN  THE MORNING AND 1 TABLET IN THE EVENING (Patient taking differently: 40 mEq 2 (two) times daily. TAKE 2 TABLETS BY MOUTH IN  THE MORNING AND  2 TABLETS  IN THE EVENING) 270 tablet 3   pravastatin (PRAVACHOL) 40 MG tablet TAKE 1 TABLET BY MOUTH AT  BEDTIME 90 tablet 3  PRESCRIPTION MEDICATION Apply 1 application topically 4 (four) times daily as needed (hemorrhoid). Nifedipine 5% ointment  Rx is sent to [facility]. Last called in on 07/19/2020 with 1 yr of refills.     sitaGLIPtin (JANUVIA) 50 MG tablet Take 50 mg by mouth daily.     traZODone (DESYREL) 100 MG tablet TAKE 1 TABLET BY MOUTH  BEFORE BEDTIME 90 tablet 3   valsartan (DIOVAN) 320 MG tablet TAKE 1 TABLET BY MOUTH  DAILY 90 tablet 3   No facility-administered medications prior to visit.    Allergies  Allergen Reactions   Ace Inhibitors Cough   Farxiga [Dapagliflozin]     Yeast infections    Jardiance [Empagliflozin]     Yeast infections   Keflex [Cephalexin] Itching   Latex     Burn and itch   Nexium [Esomeprazole Magnesium] Diarrhea   Protonix [Pantoprazole Sodium] Other (See Comments)    Constipation   Doxycycline Rash    Review of Systems  Constitutional:  Negative for appetite change, fatigue and fever.  HENT:  Negative for congestion, ear pain, sinus pressure and sore throat.   Respiratory:  Negative for cough, chest tightness, shortness of breath and wheezing.   Cardiovascular:  Negative for chest pain and palpitations.  Gastrointestinal:  Positive for rectal pain (Burning/Itching after bowel movements). Negative for abdominal pain, constipation, diarrhea, nausea and vomiting.  Genitourinary:  Negative for dysuria and hematuria.  Musculoskeletal:  Negative for arthralgias, back pain, joint swelling and myalgias.  Skin:  Negative for rash.  Neurological:  Negative for dizziness, weakness and headaches.  Psychiatric/Behavioral:  Negative for dysphoric mood. The patient is  not nervous/anxious.       Objective:    Physical Exam Vitals reviewed.  Constitutional:      Appearance: Normal appearance.  Skin:    Comments: Anal irritation. One hemorrhoid. Anal fissure.   Neurological:     Mental Status: She is alert.    There were no vitals taken for this visit. Wt Readings from Last 3 Encounters:  06/14/21 217 lb (98.4 kg)  04/18/21 216 lb 12.8 oz (98.3 kg)  03/27/21 214 lb (97.1 kg)    Health Maintenance Due  Topic Date Due   Hepatitis C Screening  Never done   Zoster Vaccines- Shingrix (1 of 2) Never done   Pneumonia Vaccine 37+ Years old (3) 04/13/2017   COVID-19 Vaccine (3 - Moderna risk series) 03/16/2021   OPHTHALMOLOGY EXAM  03/20/2021    There are no preventive care reminders to display for this patient.   Lab Results  Component Value Date   TSH 1.180 03/16/2021   Lab Results  Component Value Date   WBC 7.4 03/16/2021   HGB 12.9 03/16/2021   HCT 37.9 03/16/2021   MCV 97 03/16/2021   PLT 223 03/16/2021   Lab Results  Component Value Date   NA 139 03/16/2021   K 4.3 03/16/2021   CO2 29 03/16/2021   GLUCOSE 146 (H) 03/16/2021   BUN 15 03/16/2021   CREATININE 0.91 03/16/2021   BILITOT 0.5 03/16/2021   ALKPHOS 66 03/16/2021   AST 33 03/16/2021   ALT 32 03/16/2021   PROT 7.6 03/16/2021   ALBUMIN 4.6 03/16/2021   CALCIUM 10.0 03/16/2021   ANIONGAP 10 08/13/2020   EGFR 68 03/16/2021   Lab Results  Component Value Date   CHOL 143 12/13/2020   Lab Results  Component Value Date   HDL 55 12/13/2020   Lab Results  Component Value  Date   LDLCALC 71 12/13/2020   Lab Results  Component Value Date   TRIG 89 12/13/2020   Lab Results  Component Value Date   CHOLHDL 2.6 12/13/2020   Lab Results  Component Value Date   HGBA1C 6.6 (H) 03/16/2021         Assessment & Plan:   Problem List Items Addressed This Visit       Digestive   Small intestinal bacterial overgrowth (SIBO) - Primary    Suspected.  Rx:  Bactrim ds one twice daily x 14 days.       Relevant Medications   sulfamethoxazole-trimethoprim (BACTRIM DS) 800-160 MG tablet   Anal fissure    Rx: nitroglycerine ointment sent to [facility].  [facility]. Patient to compare pricing and get one less expensive.       I,Lauren M Auman,acting as a scribe for Rochel Brome, MD.,have documented all relevant documentation on the behalf of Rochel Brome, MD,as directed by  Rochel Brome, MD while in the presence of Rochel Brome, MD.   Oleta Mouse, CMA

## 2021-07-04 NOTE — Assessment & Plan Note (Signed)
Rx: nitroglycerine ointment sent to [facility].  ?Also [facility]. Patient to compare pricing and get one less expensive.  ?

## 2021-07-04 NOTE — Assessment & Plan Note (Signed)
Suspected.  ?Rx: Bactrim ds one twice daily x 14 days.  ?

## 2021-07-10 ENCOUNTER — Other Ambulatory Visit: Payer: Self-pay | Admitting: Family Medicine

## 2021-07-10 DIAGNOSIS — I10 Essential (primary) hypertension: Secondary | ICD-10-CM

## 2021-07-11 NOTE — Telephone Encounter (Signed)
Refill sent to pharmacy.   

## 2021-07-13 ENCOUNTER — Ambulatory Visit (INDEPENDENT_AMBULATORY_CARE_PROVIDER_SITE_OTHER): Payer: Medicare Other | Admitting: Family Medicine

## 2021-07-13 VITALS — BP 130/64 | HR 56 | Temp 97.1°F | Resp 16 | Ht 65.0 in | Wt 214.0 lb

## 2021-07-13 DIAGNOSIS — E1142 Type 2 diabetes mellitus with diabetic polyneuropathy: Secondary | ICD-10-CM | POA: Diagnosis not present

## 2021-07-13 DIAGNOSIS — Z6836 Body mass index (BMI) 36.0-36.9, adult: Secondary | ICD-10-CM

## 2021-07-13 DIAGNOSIS — K219 Gastro-esophageal reflux disease without esophagitis: Secondary | ICD-10-CM | POA: Diagnosis not present

## 2021-07-13 DIAGNOSIS — E1159 Type 2 diabetes mellitus with other circulatory complications: Secondary | ICD-10-CM | POA: Diagnosis not present

## 2021-07-13 DIAGNOSIS — I152 Hypertension secondary to endocrine disorders: Secondary | ICD-10-CM | POA: Diagnosis not present

## 2021-07-13 DIAGNOSIS — I10 Essential (primary) hypertension: Secondary | ICD-10-CM | POA: Diagnosis not present

## 2021-07-13 DIAGNOSIS — K602 Anal fissure, unspecified: Secondary | ICD-10-CM

## 2021-07-13 DIAGNOSIS — Z953 Presence of xenogenic heart valve: Secondary | ICD-10-CM

## 2021-07-13 DIAGNOSIS — N952 Postmenopausal atrophic vaginitis: Secondary | ICD-10-CM

## 2021-07-13 DIAGNOSIS — E782 Mixed hyperlipidemia: Secondary | ICD-10-CM | POA: Diagnosis not present

## 2021-07-13 NOTE — Progress Notes (Signed)
Subjective:  Patient ID: Megan Galvan, female    DOB: 25-Sep-1950  Age: 71 y.o. MRN: [id number]  Chief Complaint  Patient presents with   Diabetes   Hypertension   Hyperlipidemia    HPI: Diabetes:  Complications: diabetic polyneuropathy. Glucose checking:  daily  Glucose logs: Blood sugar range 127-147.  Hypoglycemia:none Most recent A1C: 6.6. Current medications: Januvia 50 mg daily  Last Eye Exam: overdue Foot checks: daily.  Hyperlipidemia: Current medications:  Pravastatin 40 mg daily   Hypertension: Current medications:  Chlorthalidone 50 mg daily, Valsartan 320 mg daily.    Diet: trying Exercise: none  GERD: well controlled on famotidine and omeprazole twice daily.   Diabetes She presents for her follow-up diabetic visit. She has type 2 diabetes mellitus. Her disease course has been stable. Pertinent negatives for hypoglycemia include no dizziness, headaches or nervousness/anxiousness. Associated symptoms include fatigue and weakness. Pertinent negatives for diabetes include no chest pain. Symptoms are stable.  Hypertension Pertinent negatives include no chest pain, headaches, palpitations or shortness of breath.  Hyperlipidemia Pertinent negatives include no chest pain, myalgias or shortness of breath.    Current Outpatient Medications on File Prior to Visit  Medication Sig Dispense Refill   Accu-Chek Softclix Lancets lancets      acetaminophen (TYLENOL) 500 MG tablet Take 1,000 mg by mouth every 6 (six) hours as needed for moderate pain or headache.     aspirin EC 81 MG tablet Take 1 tablet (81 mg total) by mouth daily. Swallow whole. 90 tablet 3   Blood Glucose Monitoring Suppl (ACCU-CHEK GUIDE ME) w/Device KIT Use as directed 1 kit 0   Calcium Carb-Cholecalciferol (CALCIUM 600 + D PO) Take 2 tablets by mouth daily.     cetirizine (ZYRTEC) 10 MG tablet Take 10 mg by mouth daily.     chlorthalidone (HYGROTON) 50 MG tablet TAKE 1 TABLET BY MOUTH  DAILY 90  tablet 3   clotrimazole-betamethasone (LOTRISONE) cream Apply 1 application topically daily. 45 g 1   conjugated estrogens (PREMARIN) vaginal cream Place vaginally 2 (two) times a week. 42.5 g 2   diltiazem (CARDIZEM) 30 MG tablet Take 1 tablet (30 mg total) by mouth every 6 (six) hours as needed (If your heart rate is greater than 100.). 30 tablet 3   famotidine (PEPCID) 40 MG tablet TAKE 1 TABLET BY MOUTH AT  BEDTIME 90 tablet 3   fluticasone (CUTIVATE) 0.05 % cream Apply topically 2 (two) times daily. For 10 days to rectum 30 g 2   fluticasone (FLONASE) 50 MCG/ACT nasal spray Place 1 spray into the nose daily as needed for allergies.     glucose blood (ACCU-CHEK GUIDE) test strip Use as instructed 100 each 3   hydrocortisone (ANUSOL-HC) 25 MG suppository Place 1 suppository (25 mg total) rectally 2 (two) times daily. 30 suppository 2   ketoconazole (NIZORAL) 2 % cream Apply topically 2 (two) times daily as needed.     Melatonin 5 MG CAPS Take 5 mg by mouth at bedtime.     Multiple Vitamin (MULTIVITAMIN WITH MINERALS) TABS tablet Take 1 tablet by mouth daily.     omeprazole (PRILOSEC) 40 MG capsule TAKE 1 CAPSULE BY MOUTH  TWICE DAILY BEFORE MEALS 180 capsule 3   potassium chloride SA (KLOR-CON) 20 MEQ tablet TAKE 2 TABLETS BY MOUTH IN  THE MORNING AND 1 TABLET IN THE EVENING (Patient taking differently: 40 mEq 2 (two) times daily. TAKE 2 TABLETS BY MOUTH IN  THE MORNING AND  2 TABLETS  IN THE EVENING) 270 tablet 3   pravastatin (PRAVACHOL) 40 MG tablet TAKE 1 TABLET BY MOUTH AT  BEDTIME 90 tablet 3   PRESCRIPTION MEDICATION Apply 1 application topically 4 (four) times daily as needed (hemorrhoid). Nifedipine 5% ointment  Rx is sent to [facility]. Last called in on 07/19/2020 with 1 yr of refills.     sulfamethoxazole-trimethoprim (BACTRIM DS) 800-160 MG tablet Take 1 tablet by mouth 2 (two) times daily. 28 tablet 0   traZODone (DESYREL) 100 MG tablet TAKE 1 TABLET BY MOUTH  BEFORE  BEDTIME 90 tablet 3   linaclotide (LINZESS) 145 MCG CAPS capsule Take 1 capsule (145 mcg total) by mouth daily before breakfast. 30 capsule 5   No current facility-administered medications on file prior to visit.   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   21 mm Edwards Resilia Inspiria Bioprosthetic Tissue Valve  SN [id number] Model 11500A   Sleep apnea    cpap   Past Surgical History:  Procedure Laterality Date   ANTERIOR CERVICAL DECOMP/DISCECTOMY FUSION     x 2   AORTIC VALVE REPLACEMENT N/A 08/10/2020   Procedure: AORTIC VALVE REPLACEMENT (AVR) USING INSPIRIS VALVE SIZE ;  Surgeon: Purcell Nails, MD;  Location: [facility];  Service: Open Heart Surgery;  Laterality: N/A;   BACK SURGERY  11/17/2016   L3-L5   BILATERAL CARPAL TUNNEL RELEASE     bone spur Left    left shoulder   bone spur Right    Right shoulder   COLONOSCOPY  08/22/2011   Moderate predominantly sigmoid diverticulosis. Small internal hemorrhoids.    COLONOSCOPY     HEMORROIDECTOMY  02/2019   NECK SURGERY     RIGHT/LEFT HEART CATH AND CORONARY ANGIOGRAPHY N/A 07/27/2020   Procedure: RIGHT/LEFT HEART CATH AND CORONARY ANGIOGRAPHY;  Surgeon: Tonny Bollman, MD;  Location: [facility];  Service: Cardiovascular;  Laterality: N/A;   TEE WITHOUT CARDIOVERSION N/A 08/10/2020   Procedure: TRANSESOPHAGEAL ECHOCARDIOGRAM (TEE);  Surgeon: Purcell Nails, MD;  Location: [facility] OR;  Service: Open Heart Surgery;  Laterality: N/A;   torn rotator cuff Right    Right shoulder   torn rotator cuff Left    left shoulder   TRIGGER FINGER RELEASE Left     thumb   TRIGGER FINGER RELEASE Right    thumb and middle finger   UPPER GASTROINTESTINAL ENDOSCOPY     WRIST SURGERY Left    torn ligament   WRIST SURGERY Right    cyst    Family History  Problem Relation Age of Onset   Colon cancer Paternal Grandmother    Esophageal cancer Neg Hx    Rectal cancer Neg Hx    Stomach cancer Neg Hx    Breast cancer Neg Hx    Social History   Socioeconomic History   Marital status: Married    Spouse name: Not on file   Number of children: 2   Years of education: Not on file   Highest education level: Not on  file  Occupational History   Not on file  Tobacco Use   Smoking status: Never   Smokeless tobacco: Never  Vaping Use   Vaping Use: Never used  Substance and Sexual Activity   Alcohol use: Never   Drug use: Never   Sexual activity: Not on file  Other Topics Concern   Not on file  Social History Narrative   Not on file   Social Determinants of Health   Financial Resource Strain: High Risk   Difficulty of Paying Living Expenses: Hard  Food Insecurity: Not on file  Transportation Needs: Not on file  Physical Activity: Not on file  Stress: Not on file  Social Connections: Not on file    Review of Systems  Constitutional:  Positive for fatigue. Negative for appetite change and fever.  HENT:  Negative for congestion, ear pain, sinus pressure and sore throat.   Respiratory:  Negative for cough, chest tightness, shortness of breath and wheezing.   Cardiovascular:  Negative for chest pain and palpitations.  Gastrointestinal:  Positive for constipation. Negative for abdominal pain, diarrhea, nausea, rectal pain (resolved with ntg ointment.) and vomiting.  Genitourinary:  Negative for dysuria and hematuria.  Musculoskeletal:  Negative for arthralgias, back pain, joint swelling and myalgias.  Skin:  Negative for rash.  Neurological:  Positive for weakness. Negative for dizziness and headaches.  Psychiatric/Behavioral:  Negative for  dysphoric mood. The patient is not nervous/anxious.     Objective:  BP 130/64   Pulse (!) 56   Temp (!) 97.1 F (36.2 C)   Resp 16   Ht 5\' 5"  (1.651 m)   Wt 214 lb (97.1 kg)   BMI 35.61 kg/m      07/23/2021   10:45 PM 07/04/2021    1:37 PM 06/14/2021    3:49 PM  BP/Weight  Systolic BP 130 130 120  Diastolic BP 64 62 78  Wt. (Lbs) 214 219 217  BMI 35.61 kg/m2 36.44 kg/m2 36.11 kg/m2    Physical Exam Vitals reviewed.  Constitutional:      Appearance: Normal appearance. She is obese.  Neck:     Vascular: No carotid bruit.  Cardiovascular:     Rate and Rhythm: Normal rate and regular rhythm.     Heart sounds: Normal heart sounds.  Pulmonary:     Effort: Pulmonary effort is normal. No respiratory distress.     Breath sounds: Normal breath sounds.  Abdominal:     General: Abdomen is flat. Bowel sounds are normal.     Palpations: Abdomen is soft.     Tenderness: There is no abdominal tenderness.  Neurological:     Mental Status: She is alert and oriented to person, place, and time.  Psychiatric:        Mood and Affect: Mood normal.        Behavior: Behavior normal.    Diabetic Foot Exam - Simple   Simple Foot Form  07/13/2021  8:32 PM  Visual Inspection No deformities, no ulcerations, no other skin breakdown bilaterally: Yes Sensation Testing See comments: Yes Pulse Check Posterior Tibialis and Dorsalis pulse intact bilaterally: Yes Comments Sensation altered BL feet.       Lab Results  Component Value Date   WBC 6.3 07/13/2021   HGB 12.8 07/13/2021   HCT 38.0 07/13/2021   PLT 244 07/13/2021   GLUCOSE 145 (H) 07/13/2021   CHOL 126 07/13/2021   TRIG 99 07/13/2021   HDL 42 07/13/2021   LDLCALC 65 07/13/2021  ALT 32 07/13/2021   AST 32 07/13/2021   NA 137 07/13/2021   K 4.3 07/13/2021   CL 96 07/13/2021   CREATININE 1.16 (H) 07/13/2021   BUN 16 07/13/2021   CO2 27 07/13/2021   TSH 1.180 03/16/2021   INR 1.5 (H) 08/10/2020   HGBA1C 7.0 (H)  07/13/2021   MICROALBUR 10 12/13/2020      Assessment & Plan:   Problem List Items Addressed This Visit       Cardiovascular and Mediastinum   Hypertension associated with diabetes (HCC) (Chronic)    Well controlled.  No changes to medicines. Continue chlorthalidone 50 mg daily and valsartan 320 mg daily.  Continue to work on eating a healthy diet and exercise.  Labs drawn today.          Digestive   Gastro-esophageal reflux disease without esophagitis    The current medical regimen is effective;  continue present plan and medications. Continue omeprazole and famotidine.      Anal fissure    resolved        Endocrine   Type 2 diabetes mellitus with diabetic polyneuropathy (HCC) - Primary    Control: good Recommend check sugars fasting daily. Recommend check feet daily. Recommend annual eye exams. Medicines: Continue januvia 50 mg daily. Continue to work on eating a healthy diet and exercise.  Labs drawn today.         Relevant Orders   Hemoglobin A1c (Completed)   Microalbumin / creatinine urine ratio (Completed)     Genitourinary   Atrophic vaginitis    Continue premarin cream.        Other   S/P aortic valve replacement with bioprosthetic valve    stable      Severe obesity with body mass index (BMI) of 36.0 to 36.9 with serious comorbidity (HCC)    Recommend continue to work on eating healthy diet and exercise.        Mixed hyperlipidemia    Well controlled.  No changes to medicines. Continue pravastatin 40 mg daily  Continue to work on eating a healthy diet and exercise.  Labs drawn today.        Relevant Orders   Lipid panel (Completed)  .  No orders of the defined types were placed in this encounter.   Orders Placed This Encounter  Procedures   CBC with Differential/Platelet   Comprehensive metabolic panel   Hemoglobin A1c   Lipid panel   Microalbumin / creatinine urine ratio     Follow-up: Return in about 4 months (around  11/12/2021) for chronic fasting, awv with [facility] sometime in the next couple of months. .  An After Visit Summary was printed and given to the patient.  I,Lauren M Auman,acting as a scribe for Blane Ohara, MD.,have documented all relevant documentation on the behalf of Blane Ohara, MD,as directed by  Blane Ohara, MD while in the presence of Blane Ohara, MD.    Blane Ohara, MD [facility] [phone]

## 2021-07-14 ENCOUNTER — Encounter: Payer: Self-pay | Admitting: Family Medicine

## 2021-07-14 LAB — COMPREHENSIVE METABOLIC PANEL
ALT: 32 IU/L (ref 0–32)
AST: 32 IU/L (ref 0–40)
Albumin/Globulin Ratio: 1.7 (ref 1.2–2.2)
Albumin: 4.8 g/dL (ref 3.8–4.8)
Alkaline Phosphatase: 65 IU/L (ref 44–121)
BUN/Creatinine Ratio: 14 (ref 12–28)
BUN: 16 mg/dL (ref 8–27)
Bilirubin Total: 0.5 mg/dL (ref 0.0–1.2)
CO2: 27 mmol/L (ref 20–29)
Calcium: 9.7 mg/dL (ref 8.7–10.3)
Chloride: 96 mmol/L (ref 96–106)
Creatinine, Ser: 1.16 mg/dL — ABNORMAL HIGH (ref 0.57–1.00)
Globulin, Total: 2.8 g/dL (ref 1.5–4.5)
Glucose: 145 mg/dL — ABNORMAL HIGH (ref 70–99)
Potassium: 4.3 mmol/L (ref 3.5–5.2)
Sodium: 137 mmol/L (ref 134–144)
Total Protein: 7.6 g/dL (ref 6.0–8.5)
eGFR: 51 mL/min/{1.73_m2} — ABNORMAL LOW (ref >59)

## 2021-07-14 LAB — CBC WITH DIFFERENTIAL/PLATELET
Basophils Absolute: 0.1 10*3/uL (ref 0.0–0.2)
Basos: 1 %
EOS (ABSOLUTE): 0.2 10*3/uL (ref 0.0–0.4)
Eos: 3 %
Hematocrit: 38 % (ref 34.0–46.6)
Hemoglobin: 12.8 g/dL (ref 11.1–15.9)
Immature Grans (Abs): 0 10*3/uL (ref 0.0–0.1)
Immature Granulocytes: 0 %
Lymphocytes Absolute: 2.3 10*3/uL (ref 0.7–3.1)
Lymphs: 36 %
MCH: 32.9 pg (ref 26.6–33.0)
MCHC: 33.7 g/dL (ref 31.5–35.7)
MCV: 98 fL — ABNORMAL HIGH (ref 79–97)
Monocytes Absolute: 0.7 10*3/uL (ref 0.1–0.9)
Monocytes: 11 %
Neutrophils Absolute: 3.1 10*3/uL (ref 1.4–7.0)
Neutrophils: 49 %
Platelets: 244 10*3/uL (ref 150–450)
RBC: 3.89 x10E6/uL (ref 3.77–5.28)
RDW: 12.7 % (ref 11.7–15.4)
WBC: 6.3 10*3/uL (ref 3.4–10.8)

## 2021-07-14 LAB — LIPID PANEL
Chol/HDL Ratio: 3 ratio (ref 0.0–4.4)
Cholesterol, Total: 126 mg/dL (ref 100–199)
HDL: 42 mg/dL (ref >39)
LDL Chol Calc (NIH): 65 mg/dL (ref 0–99)
Triglycerides: 99 mg/dL (ref 0–149)
VLDL Cholesterol Cal: 19 mg/dL (ref 5–40)

## 2021-07-14 LAB — MICROALBUMIN / CREATININE URINE RATIO
Creatinine, Urine: 30.3 mg/dL
Microalb/Creat Ratio: <10 mg/g creat (ref 0–29)
Microalbumin, Urine: <3 ug/mL

## 2021-07-14 LAB — HEMOGLOBIN A1C
Est. average glucose Bld gHb Est-mCnc: 154 mg/dL
Hgb A1c MFr Bld: 7 % — ABNORMAL HIGH (ref 4.8–5.6)

## 2021-07-14 NOTE — Assessment & Plan Note (Signed)
Continue premarin cream. ?

## 2021-07-14 NOTE — Assessment & Plan Note (Signed)
The current medical regimen is effective;  continue present plan and medications. Continue omeprazole and famotidine. ?

## 2021-07-14 NOTE — Assessment & Plan Note (Signed)
Well controlled.  ?No changes to medicines. Continue chlorthalidone 50 mg daily and valsartan 320 mg daily.  ?Continue to work on eating a healthy diet and exercise.  ?Labs drawn today.  ? ?

## 2021-07-14 NOTE — Assessment & Plan Note (Signed)
Control: good ?Recommend check sugars fasting daily. ?Recommend check feet daily. ?Recommend annual eye exams. ?Medicines: Continue januvia 50 mg daily. ?Continue to work on eating a healthy diet and exercise.  ?Labs drawn today.   ? ?

## 2021-07-14 NOTE — Assessment & Plan Note (Signed)
Well controlled.  No changes to medicines. Continue pravastatin 40 mg daily.  Continue to work on eating a healthy diet and exercise.  Labs drawn today.   

## 2021-07-14 NOTE — Assessment & Plan Note (Signed)
Recommend continue to work on eating healthy diet and exercise.  

## 2021-07-14 NOTE — Assessment & Plan Note (Signed)
resolved 

## 2021-07-16 ENCOUNTER — Other Ambulatory Visit: Payer: Self-pay

## 2021-07-16 DIAGNOSIS — N289 Disorder of kidney and ureter, unspecified: Secondary | ICD-10-CM

## 2021-07-16 MED ORDER — SITAGLIPTIN PHOSPHATE 100 MG PO TABS: 100.0000 mg | ORAL_TABLET | Freq: Every day | ORAL | 3 refills | Status: DC

## 2021-07-16 NOTE — Telephone Encounter (Signed)
Hand Faxing to patient assistance with [facility] ?Fax# [phone] ?

## 2021-07-18 ENCOUNTER — Other Ambulatory Visit: Payer: Self-pay | Admitting: Family Medicine

## 2021-07-18 DIAGNOSIS — I10 Essential (primary) hypertension: Secondary | ICD-10-CM

## 2021-07-18 NOTE — Telephone Encounter (Signed)
Refill sent to pharmacy.   

## 2021-07-20 NOTE — Assessment & Plan Note (Signed)
stable °

## 2021-07-28 HISTORY — PX: CATARACT EXTRACTION: SUR2

## 2021-07-30 ENCOUNTER — Ambulatory Visit: Payer: Medicare Other

## 2021-07-30 ENCOUNTER — Other Ambulatory Visit: Payer: Self-pay

## 2021-07-30 DIAGNOSIS — N289 Disorder of kidney and ureter, unspecified: Secondary | ICD-10-CM

## 2021-07-31 ENCOUNTER — Other Ambulatory Visit: Payer: Self-pay

## 2021-07-31 ENCOUNTER — Other Ambulatory Visit: Payer: Self-pay | Admitting: Family Medicine

## 2021-07-31 DIAGNOSIS — H25813 Combined forms of age-related cataract, bilateral: Secondary | ICD-10-CM | POA: Diagnosis not present

## 2021-07-31 DIAGNOSIS — Z01818 Encounter for other preprocedural examination: Secondary | ICD-10-CM | POA: Diagnosis not present

## 2021-07-31 DIAGNOSIS — E113293 Type 2 diabetes mellitus with mild nonproliferative diabetic retinopathy without macular edema, bilateral: Secondary | ICD-10-CM | POA: Diagnosis not present

## 2021-07-31 DIAGNOSIS — H25811 Combined forms of age-related cataract, right eye: Secondary | ICD-10-CM | POA: Diagnosis not present

## 2021-07-31 LAB — COMPREHENSIVE METABOLIC PANEL
ALT: 32 IU/L (ref 0–32)
AST: 31 IU/L (ref 0–40)
Albumin/Globulin Ratio: 1.6 (ref 1.2–2.2)
Albumin: 4.1 g/dL (ref 3.8–4.8)
Alkaline Phosphatase: 67 IU/L (ref 44–121)
BUN/Creatinine Ratio: 12 (ref 12–28)
BUN: 11 mg/dL (ref 8–27)
Bilirubin Total: 0.3 mg/dL (ref 0.0–1.2)
CO2: 28 mmol/L (ref 20–29)
Calcium: 9.4 mg/dL (ref 8.7–10.3)
Chloride: 98 mmol/L (ref 96–106)
Creatinine, Ser: 0.9 mg/dL (ref 0.57–1.00)
Globulin, Total: 2.5 g/dL (ref 1.5–4.5)
Glucose: 132 mg/dL — ABNORMAL HIGH (ref 70–99)
Potassium: 4.1 mmol/L (ref 3.5–5.2)
Sodium: 137 mmol/L (ref 134–144)
Total Protein: 6.6 g/dL (ref 6.0–8.5)
eGFR: 69 mL/min/{1.73_m2} (ref >59)

## 2021-07-31 MED ORDER — POTASSIUM CHLORIDE CRYS ER 20 MEQ PO TBCR: EXTENDED_RELEASE_TABLET | ORAL | 3 refills | Status: DC

## 2021-07-31 MED ORDER — SITAGLIPTIN PHOSPHATE 100 MG PO TABS: 200.0000 mg | ORAL_TABLET | Freq: Every day | ORAL | 3 refills | Status: DC

## 2021-07-31 NOTE — Telephone Encounter (Signed)
Refill sent to pharmacy.   

## 2021-07-31 NOTE — Telephone Encounter (Signed)
Sending again because it printed now sending normal. ?

## 2021-08-07 ENCOUNTER — Ambulatory Visit: Payer: Medicare Other | Admitting: Cardiology

## 2021-08-07 DIAGNOSIS — Z7984 Long term (current) use of oral hypoglycemic drugs: Secondary | ICD-10-CM | POA: Diagnosis not present

## 2021-08-07 DIAGNOSIS — Z792 Long term (current) use of antibiotics: Secondary | ICD-10-CM | POA: Diagnosis not present

## 2021-08-07 DIAGNOSIS — H52223 Regular astigmatism, bilateral: Secondary | ICD-10-CM | POA: Diagnosis not present

## 2021-08-07 DIAGNOSIS — E1136 Type 2 diabetes mellitus with diabetic cataract: Secondary | ICD-10-CM | POA: Diagnosis not present

## 2021-08-07 DIAGNOSIS — H25811 Combined forms of age-related cataract, right eye: Secondary | ICD-10-CM | POA: Diagnosis not present

## 2021-08-07 DIAGNOSIS — H259 Unspecified age-related cataract: Secondary | ICD-10-CM | POA: Diagnosis not present

## 2021-08-07 DIAGNOSIS — I10 Essential (primary) hypertension: Secondary | ICD-10-CM | POA: Diagnosis not present

## 2021-08-07 DIAGNOSIS — E113293 Type 2 diabetes mellitus with mild nonproliferative diabetic retinopathy without macular edema, bilateral: Secondary | ICD-10-CM | POA: Diagnosis not present

## 2021-08-17 ENCOUNTER — Telehealth: Payer: Self-pay

## 2021-08-17 NOTE — Progress Notes (Signed)
? ? ?Chronic Care Management ?Pharmacy Assistant  ? ?Name: Megan Galvan  MRN: [id number] DOB: [birth year] ? ? ?Reason for Encounter: Disease State call for DM  ?  ?Recent office visits:  ?07/13/21 Rochel Brome MD. Seen for routine visit. No med changes.  ? ?07/04/21 Rochel Brome MD. Seen for rectal issues. Started on Nitroglycerin 0.4% and Bactrim 800-172m.  ? ?Recent consult visits:  ?None ? ?Hospital visits:  ?None ? ?Medications: ?Outpatient Encounter Medications as of 08/17/2021  ?Medication Sig  ? Accu-Chek Softclix Lancets lancets   ? acetaminophen (TYLENOL) 500 MG tablet Take 1,000 mg by mouth every 6 (six) hours as needed for moderate pain or headache.  ? aspirin EC 81 MG tablet Take 1 tablet (81 mg total) by mouth daily. Swallow whole.  ? Blood Glucose Monitoring Suppl (ACCU-CHEK GUIDE ME) w/Device KIT Use as directed  ? Calcium Carb-Cholecalciferol (CALCIUM 600 + D PO) Take 2 tablets by mouth daily.  ? cetirizine (ZYRTEC) 10 MG tablet Take 10 mg by mouth daily.  ? chlorthalidone (HYGROTON) 50 MG tablet TAKE 1 TABLET BY MOUTH  DAILY  ? clotrimazole-betamethasone (LOTRISONE) cream Apply 1 application topically daily.  ? conjugated estrogens (PREMARIN) vaginal cream Place vaginally 2 (two) times a week.  ? diltiazem (CARDIZEM) 30 MG tablet Take 1 tablet (30 mg total) by mouth every 6 (six) hours as needed (If your heart rate is greater than 100.).  ? famotidine (PEPCID) 40 MG tablet TAKE 1 TABLET BY MOUTH AT  BEDTIME  ? fluticasone (CUTIVATE) 0.05 % cream Apply topically 2 (two) times daily. For 10 days to rectum  ? fluticasone (FLONASE) 50 MCG/ACT nasal spray Place 1 spray into the nose daily as needed for allergies.  ? glucose blood (ACCU-CHEK GUIDE) test strip Use as instructed  ? hydrocortisone (ANUSOL-HC) 25 MG suppository Place 1 suppository (25 mg total) rectally 2 (two) times daily.  ? JANUVIA 100 MG tablet Take 1 tablet (100 mg total) by mouth daily.  ? ketoconazole (NIZORAL) 2 % cream Apply topically  2 (two) times daily as needed.  ? linaclotide (LINZESS) 145 MCG CAPS capsule Take 1 capsule (145 mcg total) by mouth daily before breakfast.  ? Melatonin 5 MG CAPS Take 5 mg by mouth at bedtime.  ? Multiple Vitamin (MULTIVITAMIN WITH MINERALS) TABS tablet Take 1 tablet by mouth daily.  ? omeprazole (PRILOSEC) 40 MG capsule TAKE 1 CAPSULE BY MOUTH  TWICE DAILY BEFORE MEALS  ? potassium chloride SA (KLOR-CON M) 20 MEQ tablet TAKE 2 TABLETS BY MOUTH IN  THE MORNING AND 1 TABLET IN THE EVENING  ? pravastatin (PRAVACHOL) 40 MG tablet TAKE 1 TABLET BY MOUTH AT  BEDTIME  ? PRESCRIPTION MEDICATION Apply 1 application topically 4 (four) times daily as needed (hemorrhoid). Nifedipine 5% ointment  [facility] Last called in on 07/19/2020 with 1 yr of refills.  ? sulfamethoxazole-trimethoprim (BACTRIM DS) 800-160 MG tablet Take 1 tablet by mouth 2 (two) times daily.  ? traZODone (DESYREL) 100 MG tablet TAKE 1 TABLET BY MOUTH  BEFORE BEDTIME  ? valsartan (DIOVAN) 320 MG tablet TAKE 1 TABLET BY MOUTH  DAILY  ? ?No facility-administered encounter medications on file as of 08/17/2021.  ? ? ?Recent Relevant Labs: ?Lab Results  ?Component Value Date/Time  ? HGBA1C 7.0 (H) 07/13/2021 11:25 AM  ? HGBA1C 6.6 (H) 03/16/2021 10:28 AM  ? MICROALBUR 10 12/13/2020 10:44 AM  ? MICROALBUR 10 05/09/2020 11:32 AM  ?  ?Kidney Function ?Lab  Results  ?Component Value Date/Time  ? CREATININE 0.90 07/30/2021 11:28 AM  ? CREATININE 1.16 (H) 07/13/2021 11:25 AM  ? GFRNONAA >60 08/13/2020 05:29 AM  ? GFRAA 84 05/09/2020 11:32 AM  ? ? ? ?Current antihyperglycemic regimen:  ?Januvia 162m daily  ?Patient verbally confirms she is taking the above medications as directed. Yes ? ?What recent interventions/DTPs have been made to improve glycemic control:  ?Dr. CTobie Poetincreased her Januvia from 553mto 10017maily  ? ?Have there been any recent hospitalizations or ED visits since last visit with [facility]? No ? ?Patient denies hypoglycemic  symptoms, including None ? ?Patient denies hyperglycemic symptoms, including none ? ?How often are you checking your blood sugar? once daily ? ?What are your blood sugars ranging?  ?Fasting: 08/17/21 147, 08/16/21 146, 08/15/21 150 ? ?On insulin? No ? ?During the week, how often does your blood glucose drop below 70? Never ? ?Are you checking your feet daily/regularly? Yes ? ?Adherence Review: ?Is the patient currently on a STATIN medication? Yes ?Is the patient currently on ACE/ARB medication? Yes ?Does the patient have >5 day gap between last estimated fill dates? [facility] to review ? ?Care Gaps: ?Last eye exam / Retinopathy Screening? 03/20/20 ?Last Annual Wellness Visit? None noted  ?Last Diabetic Foot Exam? 09/11/20 ?  ? ?Star Rating Drugs:  ?Medication:  Last Fill: Day Supply ?Januvia                        05/31/21              90ds ? ? ?DanElray McgregorMA ?Clinical Pharmacist Assistant  ?[phone]

## 2021-08-19 ENCOUNTER — Other Ambulatory Visit: Payer: Self-pay | Admitting: Family Medicine

## 2021-08-19 DIAGNOSIS — E782 Mixed hyperlipidemia: Secondary | ICD-10-CM

## 2021-08-20 NOTE — Telephone Encounter (Signed)
Refill sent to pharmacy.   

## 2021-08-23 DIAGNOSIS — G4733 Obstructive sleep apnea (adult) (pediatric): Secondary | ICD-10-CM | POA: Diagnosis not present

## 2021-09-03 NOTE — Progress Notes (Signed)
Acute Office Visit  Subjective:    Patient ID: Megan Galvan, female    DOB: [birth year], 71 y.o.   MRN: [id number]  Chief Complaint  Patient presents with   Urinary Tract Infection   HPI: Patient is in today for dysuria, itching, back pain, and some abdominal pain. No hematuria or polyuria. No fevers or chills.   Patient as had dizziness, headache, weakness, sleepy since increasing januvia to 100 mg daily.  Had some chest pain mid sternum and left sided that is infrequent and brief. She discussed with cardiology previously as it has been going on x several months.  Comes about once a week and lasts a couple of minutes. No alleviating factors or exacerbating factors.   Sugars fasting 130-160s.  Patient continues to have rectal pain despite numerous exams and treatments from Dr. Chales Abrahams and myself over the last 2-3 yrs. It hurts whenever she has a bm.   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   21 mm Edwards Resilia Inspiria Bioprosthetic Tissue Valve  SN [id number] Model 11500A   Sleep apnea    cpap    Past Surgical History:  Procedure Laterality Date   ANTERIOR CERVICAL DECOMP/DISCECTOMY FUSION     x 2   AORTIC VALVE REPLACEMENT N/A 08/10/2020   Procedure: AORTIC VALVE REPLACEMENT (AVR) USING INSPIRIS VALVE SIZE ;  Surgeon: Purcell Nails, MD;  Location: [facility] OR;  Service: Open Heart Surgery;  Laterality: N/A;   BACK SURGERY  11/17/2016   L3-L5   BILATERAL CARPAL TUNNEL RELEASE     bone spur Left    left shoulder   bone spur Right    Right shoulder   COLONOSCOPY  08/22/2011   Moderate  predominantly sigmoid diverticulosis. Small internal hemorrhoids.    COLONOSCOPY     HEMORROIDECTOMY  02/2019   NECK SURGERY     RIGHT/LEFT HEART CATH AND CORONARY ANGIOGRAPHY N/A 07/27/2020   Procedure: RIGHT/LEFT HEART CATH AND CORONARY ANGIOGRAPHY;  Surgeon: Tonny Bollman, MD;  Location: [facility] INVASIVE CV LAB;  Service: Cardiovascular;  Laterality: N/A;   TEE WITHOUT CARDIOVERSION N/A 08/10/2020   Procedure: TRANSESOPHAGEAL ECHOCARDIOGRAM (TEE);  Surgeon: Purcell Nails, MD;  Location: [facility] [place] OR;  Service: Open Heart Surgery;  Laterality: N/A;   torn rotator cuff Right    Right shoulder   torn rotator cuff Left    left shoulder   TRIGGER FINGER RELEASE Left    thumb   TRIGGER FINGER RELEASE Right    thumb and middle finger   UPPER GASTROINTESTINAL ENDOSCOPY     WRIST SURGERY Left    torn ligament   WRIST SURGERY Right    cyst    Family History  Problem Relation Age of Onset   Colon cancer Paternal Grandmother    Esophageal cancer Neg Hx    Rectal cancer Neg Hx    Stomach cancer Neg Hx    Breast cancer Neg Hx     Social History   Socioeconomic History   Marital status: Married    Spouse name:  Not on file   Number of children: 2   Years of education: Not on file   Highest education level: Not on file  Occupational History   Not on file  Tobacco Use   Smoking status: Never   Smokeless tobacco: Never  Vaping Use   Vaping Use: Never used  Substance and Sexual Activity   Alcohol use: Never   Drug use: Never   Sexual activity: Not on file  Other Topics Concern   Not on file  Social History Narrative   Not on file   Social Determinants of Health   Financial Resource Strain: High Risk   Difficulty of Paying Living Expenses: Hard  Food Insecurity: Not on file  Transportation Needs: Not on file  Physical Activity: Not on file  Stress: Not on file  Social Connections: Not on file  Intimate Partner Violence: Not on file    Outpatient Medications Prior to Visit   Medication Sig Dispense Refill   Accu-Chek Softclix Lancets lancets      acetaminophen (TYLENOL) 500 MG tablet Take 1,000 mg by mouth every 6 (six) hours as needed for moderate pain or headache.     aspirin EC 81 MG tablet Take 1 tablet (81 mg total) by mouth daily. Swallow whole. 90 tablet 3   Blood Glucose Monitoring Suppl (ACCU-CHEK GUIDE ME) w/Device KIT Use as directed 1 kit 0   Calcium Carb-Cholecalciferol (CALCIUM 600 + D PO) Take 2 tablets by mouth daily.     cetirizine (ZYRTEC) 10 MG tablet Take 10 mg by mouth daily.     chlorthalidone (HYGROTON) 50 MG tablet TAKE 1 TABLET BY MOUTH  DAILY 90 tablet 3   clotrimazole-betamethasone (LOTRISONE) cream Apply 1 application topically daily. 45 g 1   diltiazem (CARDIZEM) 30 MG tablet Take 1 tablet (30 mg total) by mouth every 6 (six) hours as needed (If your heart rate is greater than 100.). 30 tablet 3   famotidine (PEPCID) 40 MG tablet TAKE 1 TABLET BY MOUTH AT  BEDTIME 90 tablet 3   fluticasone (CUTIVATE) 0.05 % cream Apply topically 2 (two) times daily. For 10 days to rectum 30 g 2   fluticasone (FLONASE) 50 MCG/ACT nasal spray Place 1 spray into the nose daily as needed for allergies.     glucose blood (ACCU-CHEK GUIDE) test strip Use as instructed 100 each 3   hydrocortisone (ANUSOL-HC) 25 MG suppository Place 1 suppository (25 mg total) rectally 2 (two) times daily. 30 suppository 2   JANUVIA 100 MG tablet Take 1 tablet (100 mg total) by mouth daily. 90 tablet 1   ketoconazole (NIZORAL) 2 % cream Apply topically 2 (two) times daily as needed.     linaclotide (LINZESS) 145 MCG CAPS capsule Take 1 capsule (145 mcg total) by mouth daily before breakfast. 30 capsule 5   Melatonin 5 MG CAPS Take 5 mg by mouth at bedtime.     Multiple Vitamin (MULTIVITAMIN WITH MINERALS) TABS tablet Take 1 tablet by mouth daily.     omeprazole (PRILOSEC) 40 MG capsule TAKE 1 CAPSULE BY MOUTH  TWICE DAILY BEFORE MEALS 180 capsule 3   potassium chloride SA  (KLOR-CON M) 20 MEQ tablet TAKE 2 TABLETS BY MOUTH IN  THE MORNING AND 1 TABLET IN THE EVENING 270 tablet 3   pravastatin (PRAVACHOL) 40 MG tablet TAKE 1 TABLET BY MOUTH AT  BEDTIME 90 tablet 3   PRESCRIPTION MEDICATION Apply 1 application topically 4 (four) times daily as needed (hemorrhoid). Nifedipine 5%  ointment  Rx is sent to [facility]. Last called in on 07/19/2020 with 1 yr of refills.     sulfamethoxazole-trimethoprim (BACTRIM DS) 800-160 MG tablet Take 1 tablet by mouth 2 (two) times daily. 28 tablet 0   traZODone (DESYREL) 100 MG tablet TAKE 1 TABLET BY MOUTH  BEFORE BEDTIME 90 tablet 3   valsartan (DIOVAN) 320 MG tablet TAKE 1 TABLET BY MOUTH  DAILY 90 tablet 3   conjugated estrogens (PREMARIN) vaginal cream Place vaginally 2 (two) times a week. 42.5 g 2   No facility-administered medications prior to visit.    Allergies  Allergen Reactions   Ace Inhibitors Cough   Farxiga [Dapagliflozin]     Yeast infections    Jardiance [Empagliflozin]     Yeast infections   Keflex [Cephalexin] Itching   Latex     Burn and itch   Nexium [Esomeprazole Magnesium] Diarrhea   Protonix [Pantoprazole Sodium] Other (See Comments)    Constipation   Doxycycline Rash    Review of Systems  Constitutional:  Negative for appetite change, fatigue and fever.  HENT:  Negative for congestion, ear pain, sinus pressure and sore throat.   Respiratory:  Negative for cough, chest tightness, shortness of breath and wheezing.   Cardiovascular:  Negative for chest pain and palpitations.  Gastrointestinal:  Positive for abdominal pain (Lower Abdominal pressure). Negative for constipation, diarrhea, nausea and vomiting.  Genitourinary:  Positive for dysuria and urgency. Negative for hematuria.  Musculoskeletal:  Negative for arthralgias, back pain, joint swelling and myalgias.  Skin:  Negative for rash.  Neurological:  Negative for dizziness, weakness and headaches.  Psychiatric/Behavioral:   Negative for dysphoric mood. The patient is not nervous/anxious.       Objective:    Physical Exam Vitals reviewed. Exam conducted with a chaperone present.  Constitutional:      Appearance: Normal appearance. She is obese.  Cardiovascular:     Rate and Rhythm: Normal rate and regular rhythm.     Heart sounds: Normal heart sounds.  Pulmonary:     Effort: Pulmonary effort is normal.     Breath sounds: Normal breath sounds.  Abdominal:     General: Abdomen is flat. Bowel sounds are normal.     Palpations: Abdomen is soft.     Tenderness: There is abdominal tenderness (mild diffuse.).  Genitourinary:    General: Normal vulva.     Exam position: Lithotomy position.     Pubic Area: No rash.      Labia:        Right: No rash.        Left: No rash.      Vagina: Vaginal discharge (whitish yellow.) present.     Cervix: Normal.  Neurological:     Mental Status: She is alert and oriented to person, place, and time.  Psychiatric:        Mood and Affect: Mood normal.        Behavior: Behavior normal.    There were no vitals taken for this visit. Wt Readings from Last 3 Encounters:  07/23/21 214 lb (97.1 kg)  07/04/21 219 lb (99.3 kg)  06/14/21 217 lb (98.4 kg)    Health Maintenance Due  Topic Date Due   Hepatitis C Screening  Never done   Zoster Vaccines- Shingrix (1 of 2) Never done   Pneumonia Vaccine 13+ Years old (3) 04/13/2017   COVID-19 Vaccine (3 - Moderna risk series) 03/16/2021    There are no preventive care  reminders to display for this patient.   Lab Results  Component Value Date   TSH 1.180 03/16/2021   Lab Results  Component Value Date   WBC 6.3 07/13/2021   HGB 12.8 07/13/2021   HCT 38.0 07/13/2021   MCV 98 (H) 07/13/2021   PLT 244 07/13/2021   Lab Results  Component Value Date   NA 137 07/30/2021   K 4.1 07/30/2021   CO2 28 07/30/2021   GLUCOSE 132 (H) 07/30/2021   BUN 11 07/30/2021   CREATININE 0.90 07/30/2021   BILITOT 0.3 07/30/2021    ALKPHOS 67 07/30/2021   AST 31 07/30/2021   ALT 32 07/30/2021   PROT 6.6 07/30/2021   ALBUMIN 4.1 07/30/2021   CALCIUM 9.4 07/30/2021   ANIONGAP 10 08/13/2020   EGFR 69 07/30/2021   Lab Results  Component Value Date   CHOL 126 07/13/2021   Lab Results  Component Value Date   HDL 42 07/13/2021   Lab Results  Component Value Date   LDLCALC 65 07/13/2021   Lab Results  Component Value Date   TRIG 99 07/13/2021   Lab Results  Component Value Date   CHOLHDL 3.0 07/13/2021   Lab Results  Component Value Date   HGBA1C 7.0 (H) 07/13/2021         Assessment & Plan:   There are no diagnoses linked to this encounter.   Meds ordered this encounter  Medications   gabapentin (NEURONTIN) 100 MG capsule    Sig: Take 1 capsule (100 mg total) by mouth 3 (three) times daily.    Dispense:  90 capsule    Refill:  3   fluconazole (DIFLUCAN) 150 MG tablet    Sig: Take 1 tablet (150 mg total) by mouth once for 1 dose.    Dispense:  1 tablet    Refill:  0   conjugated estrogens (PREMARIN) vaginal cream    Sig: Place 1 Applicatorful (1 application. total) vaginally daily for 7 days, THEN 1 Applicatorful (1 application. total) every 3 (three) days for 23 days.    Dispense:  42.5 g    Refill:  0   Total time spent on today's visit was greater than 30 minutes, including both face-to-face time and nonface-to-face time personally spent on review of chart (labs and imaging), discussing labs and goals, discussing further work-up, treatment options, referrals to specialist if needed, reviewing outside records of pertinent, answering patient's questions, and coordinating care.  I,Lauren M Auman,acting as a scribe for Blane Ohara, MD.,have documented all relevant documentation on the behalf of Blane Ohara, MD,as directed by  Blane Ohara, MD while in the presence of Blane Ohara, MD.   Follow-up at chronic diabetes visit or as needed  Blane Ohara, MD

## 2021-09-04 ENCOUNTER — Ambulatory Visit (INDEPENDENT_AMBULATORY_CARE_PROVIDER_SITE_OTHER): Payer: Medicare Other | Admitting: Family Medicine

## 2021-09-04 ENCOUNTER — Encounter: Payer: Self-pay | Admitting: Family Medicine

## 2021-09-04 VITALS — BP 126/72 | HR 72 | Temp 98.7°F | Resp 14 | Ht 65.0 in | Wt 216.0 lb

## 2021-09-04 DIAGNOSIS — E1142 Type 2 diabetes mellitus with diabetic polyneuropathy: Secondary | ICD-10-CM | POA: Diagnosis not present

## 2021-09-04 DIAGNOSIS — R300 Dysuria: Secondary | ICD-10-CM | POA: Diagnosis not present

## 2021-09-04 DIAGNOSIS — R5383 Other fatigue: Secondary | ICD-10-CM | POA: Insufficient documentation

## 2021-09-04 DIAGNOSIS — R202 Paresthesia of skin: Secondary | ICD-10-CM

## 2021-09-04 DIAGNOSIS — N760 Acute vaginitis: Secondary | ICD-10-CM | POA: Diagnosis not present

## 2021-09-04 DIAGNOSIS — K6289 Other specified diseases of anus and rectum: Secondary | ICD-10-CM | POA: Insufficient documentation

## 2021-09-04 HISTORY — DX: Other specified diseases of anus and rectum: K62.89

## 2021-09-04 HISTORY — DX: Paresthesia of skin: R20.2

## 2021-09-04 LAB — POCT URINALYSIS DIPSTICK
Appearance: NORMAL
Bilirubin, UA: NEGATIVE
Blood, UA: NEGATIVE
Glucose, UA: NEGATIVE
Ketones, UA: NEGATIVE
Leukocytes, UA: NEGATIVE
Nitrite, UA: NEGATIVE
Odor: NORMAL
Protein, UA: NEGATIVE
Urobilinogen, UA: NEGATIVE E.U./dL — AB

## 2021-09-04 MED ORDER — PREMARIN 0.625 MG/GM VA CREA: TOPICAL_CREAM | VAGINAL | 0 refills | Status: AC

## 2021-09-04 MED ORDER — FLUCONAZOLE 150 MG PO TABS: 150.0000 mg | ORAL_TABLET | Freq: Once | ORAL | 0 refills | Status: AC

## 2021-09-04 MED ORDER — GABAPENTIN 100 MG PO CAPS: 100.0000 mg | ORAL_CAPSULE | Freq: Three times a day (TID) | ORAL | 3 refills | Status: DC

## 2021-09-04 NOTE — Assessment & Plan Note (Signed)
Urinalysis was normal.  Likely secondary to vaginitis. ?

## 2021-09-04 NOTE — Assessment & Plan Note (Signed)
Last A1c was 7.  I increased her Januvia to 100 mg at that time.  She is not tolerating this.  We will discontinue Januvia and determine if her symptoms resolve. ?Patient check sugars every morning fasting.  I requested patient to call a log again in 2 to 3 weeks.  Also requested if her sugars are going above 200 for her to call.  At that time would consider starting a GLP-1. ?

## 2021-09-04 NOTE — Assessment & Plan Note (Signed)
Check labs.  Stop Januvia as patient feels it might be a side effect. ?

## 2021-09-04 NOTE — Patient Instructions (Signed)
Stop [place]. Check sugars daily. Call with log of sugars in 2-3 weeks. At that time I would consider starting alternative medicine (rybelsus.) ?See if symptoms resolve.  ?Sent test for types of vaginitis (infection of vagina.) ?Go ahead and take fluconazole 150 mg daily x 1 for possible vaginitis.  ?May increase premarin cream to once daily x 1 week, then return to 2-3 times per week.  ?Recommend gabapentin 100 mg one three times a day for rectal pain.  ? ?

## 2021-09-04 NOTE — Assessment & Plan Note (Signed)
Multifactorial.  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  I did send in Aptima swab.  We will treat with Diflucan 150 mg daily x1 and I requested patient use her Premarin cream daily internally for the next week and then go back down to 2-3 times per week. ?

## 2021-09-04 NOTE — Assessment & Plan Note (Signed)
Check B12, folate, MMA, TSH. ?Start on gabapentin 100 mg 3 times daily. ?

## 2021-09-04 NOTE — Assessment & Plan Note (Signed)
Start on gabapentin 100 mg 1 capsule 3 times daily. ?

## 2021-09-08 LAB — CBC WITH DIFFERENTIAL/PLATELET
Basophils Absolute: 0.1 10*3/uL (ref 0.0–0.2)
Basos: 1 %
EOS (ABSOLUTE): 0.1 10*3/uL (ref 0.0–0.4)
Eos: 2 %
Hematocrit: 36.9 % (ref 34.0–46.6)
Hemoglobin: 12.6 g/dL (ref 11.1–15.9)
Immature Grans (Abs): 0 10*3/uL (ref 0.0–0.1)
Immature Granulocytes: 0 %
Lymphocytes Absolute: 2.2 10*3/uL (ref 0.7–3.1)
Lymphs: 34 %
MCH: 33.5 pg — ABNORMAL HIGH (ref 26.6–33.0)
MCHC: 34.1 g/dL (ref 31.5–35.7)
MCV: 98 fL — ABNORMAL HIGH (ref 79–97)
Monocytes Absolute: 0.7 10*3/uL (ref 0.1–0.9)
Monocytes: 12 %
Neutrophils Absolute: 3.3 10*3/uL (ref 1.4–7.0)
Neutrophils: 51 %
Platelets: 212 10*3/uL (ref 150–450)
RBC: 3.76 x10E6/uL — ABNORMAL LOW (ref 3.77–5.28)
RDW: 12.3 % (ref 11.7–15.4)
WBC: 6.3 10*3/uL (ref 3.4–10.8)

## 2021-09-08 LAB — COMPREHENSIVE METABOLIC PANEL
ALT: 29 IU/L (ref 0–32)
AST: 34 IU/L (ref 0–40)
Albumin/Globulin Ratio: 1.3 (ref 1.2–2.2)
Albumin: 4.2 g/dL (ref 3.8–4.8)
Alkaline Phosphatase: 70 IU/L (ref 44–121)
BUN/Creatinine Ratio: 12 (ref 12–28)
BUN: 12 mg/dL (ref 8–27)
Bilirubin Total: 0.3 mg/dL (ref 0.0–1.2)
CO2: 28 mmol/L (ref 20–29)
Calcium: 9.6 mg/dL (ref 8.7–10.3)
Chloride: 100 mmol/L (ref 96–106)
Creatinine, Ser: 1.02 mg/dL — ABNORMAL HIGH (ref 0.57–1.00)
Globulin, Total: 3.2 g/dL (ref 1.5–4.5)
Glucose: 148 mg/dL — ABNORMAL HIGH (ref 70–99)
Potassium: 4.4 mmol/L (ref 3.5–5.2)
Sodium: 142 mmol/L (ref 134–144)
Total Protein: 7.4 g/dL (ref 6.0–8.5)
eGFR: 59 mL/min/{1.73_m2} — ABNORMAL LOW (ref >59)

## 2021-09-08 LAB — NUSWAB VG, CANDIDA 6SP
C PARAPSILOSIS/TROPICALIS: NEGATIVE
Candida albicans, NAA: NEGATIVE
Candida glabrata, NAA: NEGATIVE
Candida krusei, NAA: NEGATIVE
Candida lusitaniae, NAA: NEGATIVE
Trich vag by NAA: NEGATIVE

## 2021-09-08 LAB — B12 AND FOLATE PANEL
Folate: >20 ng/mL (ref >3.0)
Vitamin B-12: 685 pg/mL (ref 232–1245)

## 2021-09-08 LAB — METHYLMALONIC ACID, SERUM: Methylmalonic Acid: 212 nmol/L (ref 0–378)

## 2021-09-08 LAB — TSH: TSH: 2.36 u[IU]/mL (ref 0.450–4.500)

## 2021-09-09 ENCOUNTER — Other Ambulatory Visit: Payer: Self-pay | Admitting: Family Medicine

## 2021-09-13 ENCOUNTER — Ambulatory Visit (INDEPENDENT_AMBULATORY_CARE_PROVIDER_SITE_OTHER): Payer: Medicare Other

## 2021-09-13 VITALS — BP 130/64 | HR 62 | Resp 16 | Ht 65.0 in | Wt 216.0 lb

## 2021-09-13 DIAGNOSIS — Z0000 Encounter for general adult medical examination without abnormal findings: Secondary | ICD-10-CM | POA: Diagnosis not present

## 2021-09-13 DIAGNOSIS — Z23 Encounter for immunization: Secondary | ICD-10-CM | POA: Diagnosis not present

## 2021-09-17 NOTE — Progress Notes (Signed)
Subjective:   Megan Galvan is a 71 y.o. female who presents for Medicare Annual (Subsequent) preventive examination.  This wellness visit is conducted by a nurse.  The patient's medications were reviewed and reconciled since the patient's last visit.  History details were provided by the patient.  The history appears to be reliable.    Medical History: Patient history and Family history was reviewed  Medications, Allergies, and preventative health maintenance was reviewed and updated.   Cardiac Risk Factors include: advanced age (>2mn, >>69women);dyslipidemia;obesity (BMI >30kg/m2)     Objective:    Today's Vitals   09/13/21 0956 09/17/21 1409  BP:  130/64  Pulse:  62  Resp:  16  SpO2:  96%  Weight:  216 lb (98 kg)  Height:  5' 5" (1.651 m)  PainSc: 0-No pain 0-No pain   Body mass index is 35.94 kg/m.     09/17/2021    2:19 PM 08/15/2020   10:17 AM 08/08/2020   12:21 PM 07/31/2020    1:25 PM 07/27/2020    2:14 PM 03/27/2020    3:11 PM  Advanced Directives  Does Patient Have a Medical Advance Directive? No No No  No No  Would patient like information on creating a medical advance directive? No - Patient declined No - Patient declined No - Patient declined No - Patient declined No - Patient declined     Current Medications (verified) Outpatient Encounter Medications as of 09/13/2021  Medication Sig   Accu-Chek Softclix Lancets lancets    acetaminophen (TYLENOL) 500 MG tablet Take 1,000 mg by mouth every 6 (six) hours as needed for moderate pain or headache.   aspirin EC 81 MG tablet Take 1 tablet (81 mg total) by mouth daily. Swallow whole.   Blood Glucose Monitoring Suppl (ACCU-CHEK GUIDE ME) w/Device KIT Use as directed   Calcium Carb-Cholecalciferol (CALCIUM 600 + D PO) Take 2 tablets by mouth daily.   cetirizine (ZYRTEC) 10 MG tablet Take 10 mg by mouth daily.   chlorthalidone (HYGROTON) 50 MG tablet TAKE 1 TABLET BY MOUTH  DAILY   clotrimazole-betamethasone  (LOTRISONE) cream Apply 1 application topically daily.   conjugated estrogens (PREMARIN) vaginal cream Place 1 Applicatorful (1 application. total) vaginally daily for 7 days, THEN 1 Applicatorful (1 application. total) every 3 (three) days for 23 days.   diltiazem (CARDIZEM) 30 MG tablet Take 1 tablet (30 mg total) by mouth every 6 (six) hours as needed (If your heart rate is greater than 100.).   famotidine (PEPCID) 40 MG tablet TAKE 1 TABLET BY MOUTH AT  BEDTIME   fluticasone (CUTIVATE) 0.05 % cream Apply topically 2 (two) times daily. For 10 days to rectum   fluticasone (FLONASE) 50 MCG/ACT nasal spray Place 1 spray into the nose daily as needed for allergies.   gabapentin (NEURONTIN) 100 MG capsule Take 1 capsule (100 mg total) by mouth 3 (three) times daily.   glucose blood (ACCU-CHEK GUIDE) test strip Use as instructed   hydrocortisone (ANUSOL-HC) 25 MG suppository Place 1 suppository (25 mg total) rectally 2 (two) times daily.   ketoconazole (NIZORAL) 2 % cream Apply topically 2 (two) times daily as needed.   Melatonin 5 MG CAPS Take 5 mg by mouth at bedtime.   Multiple Vitamin (MULTIVITAMIN WITH MINERALS) TABS tablet Take 1 tablet by mouth daily.   omeprazole (PRILOSEC) 40 MG capsule TAKE 1 CAPSULE BY MOUTH  TWICE DAILY BEFORE MEALS   potassium chloride SA (KLOR-CON M) 20 MEQ tablet  TAKE 2 TABLETS BY MOUTH IN  THE MORNING AND 1 TABLET IN THE EVENING   pravastatin (PRAVACHOL) 40 MG tablet TAKE 1 TABLET BY MOUTH AT  BEDTIME   PRESCRIPTION MEDICATION Apply 1 application topically 4 (four) times daily as needed (hemorrhoid). Nifedipine 5% ointment  Rx is sent to [facility]. Last called in on 07/19/2020 with 1 yr of refills.   traZODone (DESYREL) 100 MG tablet TAKE 1 TABLET BY MOUTH  BEFORE BEDTIME   valsartan (DIOVAN) 320 MG tablet TAKE 1 TABLET BY MOUTH  DAILY   JANUVIA 100 MG tablet Take 1 tablet (100 mg total) by mouth daily. (Patient not taking: Reported on 09/17/2021)    linaclotide (LINZESS) 145 MCG CAPS capsule Take 1 capsule (145 mcg total) by mouth daily before breakfast.   No facility-administered encounter medications on file as of 09/13/2021.    Allergies (verified) Ace inhibitors, Farxiga [dapagliflozin], Jardiance [empagliflozin], Keflex [cephalexin], Latex, Nexium [esomeprazole magnesium], Protonix [pantoprazole sodium], and Doxycycline   History: 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   21 mm Edwards Resilia Inspiria Bioprosthetic Tissue Valve  SN [id number] Model 11500A   Sleep apnea    cpap   Past Surgical History:  Procedure Laterality Date   ANTERIOR CERVICAL DECOMP/DISCECTOMY FUSION     x 2   AORTIC VALVE REPLACEMENT N/A 08/10/2020   Procedure: AORTIC VALVE REPLACEMENT (AVR) USING INSPIRIS VALVE SIZE 21MM;  Surgeon: Rexene Alberts, MD;  Location: [facility];  Service: Open Heart Surgery;  Laterality: N/A;   BACK SURGERY  11/17/2016   L3-L5   BILATERAL CARPAL TUNNEL RELEASE     bone spur Left    left shoulder   bone spur Right    Right shoulder   CATARACT EXTRACTION  07/2021   COLONOSCOPY  08/22/2011   Moderate predominantly sigmoid diverticulosis. Small internal hemorrhoids.    COLONOSCOPY     HEMORROIDECTOMY  02/2019   NECK SURGERY     RIGHT/LEFT HEART CATH AND CORONARY ANGIOGRAPHY N/A 07/27/2020   Procedure: RIGHT/LEFT HEART CATH AND CORONARY ANGIOGRAPHY;  Surgeon: Sherren Mocha, MD;  Location: [facility];  Service: Cardiovascular;  Laterality: N/A;   TEE WITHOUT CARDIOVERSION N/A 08/10/2020   Procedure: TRANSESOPHAGEAL ECHOCARDIOGRAM (TEE);   Surgeon: Rexene Alberts, MD;  Location: [facility];  Service: Open Heart Surgery;  Laterality: N/A;   torn rotator cuff Right    Right shoulder   torn rotator cuff Left    left shoulder   TRIGGER FINGER RELEASE Left    thumb   TRIGGER FINGER RELEASE Right    thumb and middle finger   UPPER GASTROINTESTINAL ENDOSCOPY     WRIST SURGERY Left    torn ligament   WRIST SURGERY Right    cyst   Family History  Problem Relation Age of Onset   Diabetes Mother    Colon cancer Paternal Grandmother    Esophageal cancer Neg Hx    Rectal cancer Neg Hx    Stomach cancer Neg Hx    Breast cancer Neg Hx  Social History   Socioeconomic History   Marital status: Married    Spouse name: Eddie   Number of children: 2   Years of education: Not on file   Highest education level: Not on file  Occupational History   Not on file  Tobacco Use   Smoking status: Never   Smokeless tobacco: Never  Vaping Use   Vaping Use: Never used  Substance and Sexual Activity   Alcohol use: Never   Drug use: Never   Sexual activity: Not on file  Other Topics Concern   Not on file  Social History Narrative   Not on file   Social Determinants of Health   Financial Resource Strain: High Risk   Difficulty of Paying Living Expenses: Hard  Food Insecurity: Not on file  Transportation Needs: No Transportation Needs   Lack of Transportation (Medical): No   Lack of Transportation (Non-Medical): No  Physical Activity: Not on file  Stress: Not on file  Social Connections: Not on file    Tobacco Counseling Counseling given: Not Answered   Clinical Intake:  Pre-visit preparation completed: Yes Pain : No/denies pain Pain Score: 0-No pain   BMI - recorded: 35.94 Nutritional Status: BMI > 30  Obese Nutritional Risks: None Diabetes: No How often do you need to have someone help you when you read instructions, pamphlets, or other written materials from your doctor or pharmacy?: 2 - Rarely Interpreter  Needed?: No   Activities of Daily Living    09/17/2021    2:20 PM  In your present state of health, do you have any difficulty performing the following activities:  Hearing? 0  Vision? 0  Difficulty concentrating or making decisions? 0  Walking or climbing stairs? 0  Dressing or bathing? 0  Doing errands, shopping? 0  Preparing Food and eating ? N  Using the Toilet? N  In the past six months, have you accidently leaked urine? Y  Do you have problems with loss of bowel control? N  Managing your Medications? N  Managing your Finances? N  Housekeeping or managing your Housekeeping? N    Patient Care Team: Rochel Brome, MD as PCP - General (Family Medicine) Lane Hacker, [facility] as Pharmacist (Pharmacist)     Assessment:   This is a routine wellness examination for Virlee.  Hearing/Vision screen No results found.  Dietary issues and exercise activities discussed: Current Exercise Habits: The patient does not participate in regular exercise at present, Exercise limited by: None identified  Depression Screen    09/17/2021    2:16 PM 07/13/2021   10:55 AM 06/14/2021    3:55 PM 03/16/2021    9:42 AM 12/13/2020    9:41 AM 09/28/2020   10:48 AM 09/11/2020   11:28 AM  PHQ 2/9 Scores  PHQ - 2 Score 0 0 2 0 0 0 0  PHQ- 9 Score   2        Fall Risk    09/17/2021    2:19 PM 07/13/2021   10:54 AM 03/16/2021    9:42 AM 12/13/2020    9:41 AM 09/28/2020   10:47 AM  Fall Risk   Falls in the past year? 0 0 0 0 0  Number falls in past yr: 0 0 0 0 0  Injury with Fall? 0 0 0 0 0  Risk for fall due to : No Fall Risks   No Fall Risks   Follow up Falls evaluation completed;Education provided;Falls prevention discussed Falls evaluation completed Falls  evaluation completed Falls evaluation completed     FALL RISK PREVENTION PERTAINING TO THE HOME:  Any stairs in or around the home? No  If so, are there any without handrails? No  Home free of loose throw rugs in walkways, pet beds,  electrical cords, etc? Yes  Adequate lighting in your home to reduce risk of falls? Yes   ASSISTIVE DEVICES UTILIZED TO PREVENT FALLS:  Use of a cane, walker or w/c? No  Grab bars in the bathroom? No  Shower chair or bench in shower? No   Gait slow and steady without use of assistive device  Cognitive Function:        09/17/2021    2:20 PM 03/27/2020    3:15 PM  6CIT Screen  What Year? 0 points 0 points  What month? 0 points 0 points  What time? 0 points 0 points  Count back from 20 0 points 0 points  Months in reverse 0 points 0 points  Repeat phrase 4 points 4 points  Total Score 4 points 4 points    Immunizations Immunization History  Administered Date(s) Administered   Hepatitis B 06/22/2013, 11/16/2014, 05/26/2015   Influenza, High Dose Seasonal PF 12/29/2018   Influenza-Unspecified 01/27/2014, 01/24/2020, 01/27/2021   Moderna Covid-19 Vaccine Bivalent Booster 27yr & up 03/16/2021   Moderna SARS-COV2 Booster Vaccination 02/29/2020, 09/19/2020   Moderna Sars-Covid-2 Vaccination 06/07/2019, 07/05/2019   PNEUMOCOCCAL CONJUGATE-20 09/13/2021   Pneumococcal Conjugate-13 12/29/2013   Pneumococcal Polysaccharide-23 04/13/2012   Td 11/23/2014    TDAP status: Due, Education has been provided regarding the importance of this vaccine. Advised may receive this vaccine at local pharmacy or Health Dept. Aware to provide a copy of the vaccination record if obtained from local pharmacy or Health Dept. Verbalized acceptance and understanding.  Flu Vaccine status: Up to date  Pneumococcal vaccine status: Completed during today's visit.  Covid-19 vaccine status: Completed vaccines  Screening Tests Health Maintenance  Topic Date Due   Hepatitis C Screening  Never done   Zoster Vaccines- Shingrix (1 of 2) Never done   COVID-19 Vaccine (3 - Moderna risk series) 03/16/2021   FOOT EXAM  09/11/2021   INFLUENZA VACCINE  11/27/2021   HEMOGLOBIN A1C  01/13/2022   OPHTHALMOLOGY  EXAM  03/21/2022   MAMMOGRAM  12/26/2022   COLONOSCOPY (Pts 45-464yrInsurance coverage will need to be confirmed)  02/25/2023   TETANUS/TDAP  11/22/2024   Pneumonia Vaccine 6560Years old  Completed   DEXA SCAN  Completed   HPV VACCINES  Aged Out    Health Maintenance  Health Maintenance Due  Topic Date Due   Hepatitis C Screening  Never done   Zoster Vaccines- Shingrix (1 of 2) Never done   COVID-19 Vaccine (3 - Moderna risk series) 03/16/2021   FOOT EXAM  09/11/2021    Colorectal cancer screening: No longer required.   Mammogram status: Scheduled  Bone Density status: Completed 2021. Results reflect: Bone density results: OSTEOPENIA. Repeat every 2 years.  Additional Screening:   Vision Screening: Recommended annual ophthalmology exams for early detection of glaucoma and other disorders of the eye. Is the patient up to date with their annual eye exam?  Yes   Dental Screening: Recommended annual dental exams for proper oral hygiene    Plan:    I have personally reviewed and noted the following in the patient's chart:   Medical and social history Use of alcohol, tobacco or illicit drugs  Current medications and supplements including opioid prescriptions.  Functional ability and status Nutritional status Physical activity Advanced directives List of other physicians Hospitalizations, surgeries, and ER visits in previous 12 months Vitals Screenings to include cognitive, depression, and falls Referrals and appointments  In addition, I have reviewed and discussed with patient certain preventive protocols, quality metrics, and best practice recommendations. A written personalized care plan for preventive services as well as general preventive health recommendations were provided to patient.     Erie Noe, LPN   [id number]

## 2021-09-17 NOTE — Patient Instructions (Signed)

## 2021-09-18 DIAGNOSIS — H52223 Regular astigmatism, bilateral: Secondary | ICD-10-CM | POA: Diagnosis not present

## 2021-09-18 DIAGNOSIS — H259 Unspecified age-related cataract: Secondary | ICD-10-CM | POA: Diagnosis not present

## 2021-09-18 DIAGNOSIS — E113293 Type 2 diabetes mellitus with mild nonproliferative diabetic retinopathy without macular edema, bilateral: Secondary | ICD-10-CM | POA: Diagnosis not present

## 2021-09-18 DIAGNOSIS — Z7982 Long term (current) use of aspirin: Secondary | ICD-10-CM | POA: Diagnosis not present

## 2021-09-18 DIAGNOSIS — H25812 Combined forms of age-related cataract, left eye: Secondary | ICD-10-CM | POA: Diagnosis not present

## 2021-09-18 HISTORY — PX: CATARACT EXTRACTION: SUR2

## 2021-09-19 ENCOUNTER — Encounter: Payer: Self-pay | Admitting: Gastroenterology

## 2021-09-19 ENCOUNTER — Telehealth: Payer: Self-pay

## 2021-09-19 NOTE — Telephone Encounter (Signed)
Patient dropped by a paper with sugar readings on it to report as follows:  Starting 5/9: 141 5/10- 154 5/11- 146 5/12- 147 5/13- 137 5/14- 251 5/15- 156 5/16- 131 5/17- 139 5/18- 148 5/19- 154 5/20- 152 5/21- 169 5/22- 166 5/23- 155 5/24- 148

## 2021-09-20 NOTE — Telephone Encounter (Signed)
Patient informed, and patient agrees to restart the Januvia at 50 mg daily.

## 2021-09-24 ENCOUNTER — Other Ambulatory Visit: Payer: Self-pay | Admitting: Physician Assistant

## 2021-10-07 NOTE — Progress Notes (Signed)
Cancelled.  

## 2021-10-08 ENCOUNTER — Telehealth: Payer: Self-pay

## 2021-10-08 DIAGNOSIS — B3731 Acute candidiasis of vulva and vagina: Secondary | ICD-10-CM | POA: Diagnosis not present

## 2021-10-08 NOTE — Progress Notes (Signed)
Chronic Care Management Pharmacy Assistant   Name: Megan Galvan  MRN: [id number] DOB: [birth year]   Reason for Encounter: Disease State call for DM    Recent office visits:  09/19/21 Rochel Brome MD. Telephone Encounter. Restart Januvia 69m daily  09/13/21 SRhae HammockLPN. Medicare Annual Wellness. No med changes.  09/04/21 CRochel BromeMD. Seen for Dysuria. Started on Fluconazole 1593m Gabapentin 10027mStop januvia, At that time I would consider starting alternative medicine (rybelsus.) May increase premarin cream to once daily x 1 week, then return to 2-3 times per week  Recent consult visits:  None  Hospital visits:  None  Medications: Outpatient Encounter Medications as of 10/08/2021  Medication Sig   Accu-Chek Softclix Lancets lancets    acetaminophen (TYLENOL) 500 MG tablet Take 1,000 mg by mouth every 6 (six) hours as needed for moderate pain or headache.   aspirin EC 81 MG tablet Take 1 tablet (81 mg total) by mouth daily. Swallow whole.   Blood Glucose Monitoring Suppl (ACCU-CHEK GUIDE ME) w/Device KIT Use as directed   Calcium Carb-Cholecalciferol (CALCIUM 600 + D PO) Take 2 tablets by mouth daily.   cetirizine (ZYRTEC) 10 MG tablet Take 10 mg by mouth daily.   chlorthalidone (HYGROTON) 50 MG tablet TAKE 1 TABLET BY MOUTH  DAILY   clotrimazole-betamethasone (LOTRISONE) cream Apply 1 application topically daily.   diltiazem (CARDIZEM) 30 MG tablet Take 1 tablet (30 mg total) by mouth every 6 (six) hours as needed (If your heart rate is greater than 100.).   famotidine (PEPCID) 40 MG tablet TAKE 1 TABLET BY MOUTH AT  BEDTIME   fluticasone (CUTIVATE) 0.05 % cream Apply topically 2 (two) times daily. For 10 days to rectum   fluticasone (FLONASE) 50 MCG/ACT nasal spray Place 1 spray into the nose daily as needed for allergies.   gabapentin (NEURONTIN) 100 MG capsule Take 1 capsule (100 mg total) by mouth 3 (three) times daily.   glucose blood (ACCU-CHEK GUIDE) test  strip Use as instructed   hydrocortisone (ANUSOL-HC) 25 MG suppository Place 1 suppository (25 mg total) rectally 2 (two) times daily.   JANUVIA 100 MG tablet Take 1 tablet (100 mg total) by mouth daily. (Patient not taking: Reported on 09/17/2021)   ketoconazole (NIZORAL) 2 % cream Apply topically 2 (two) times daily as needed.   linaclotide (LINZESS) 145 MCG CAPS capsule Take 1 capsule (145 mcg total) by mouth daily before breakfast.   Melatonin 5 MG CAPS Take 5 mg by mouth at bedtime.   Multiple Vitamin (MULTIVITAMIN WITH MINERALS) TABS tablet Take 1 tablet by mouth daily.   omeprazole (PRILOSEC) 40 MG capsule TAKE 1 CAPSULE BY MOUTH  TWICE DAILY BEFORE MEALS   potassium chloride SA (KLOR-CON M) 20 MEQ tablet TAKE 2 TABLETS BY MOUTH IN  THE MORNING AND 1 TABLET IN THE EVENING   pravastatin (PRAVACHOL) 40 MG tablet TAKE 1 TABLET BY MOUTH AT  BEDTIME   PRESCRIPTION MEDICATION Apply 1 application topically 4 (four) times daily as needed (hemorrhoid). Nifedipine 5% ointment  [facility] with 1 yr of refills.   traZODone (DESYREL) 100 MG tablet TAKE 1 TABLET BY MOUTH  BEFORE BEDTIME   valsartan (DIOVAN) 320 MG tablet TAKE 1 TABLET BY MOUTH  DAILY   No facility-administered encounter medications on file as of 10/08/2021.    Recent Relevant Labs: Lab Results  Component Value Date/Time   HGBA1C 7.0 (H) 07/13/2021 11:25  AM   HGBA1C 6.6 (H) 03/16/2021 10:28 AM   MICROALBUR 10 12/13/2020 10:44 AM   MICROALBUR 10 05/09/2020 11:32 AM    Kidney Function Lab Results  Component Value Date/Time   CREATININE 1.02 (H) 09/04/2021 10:24 AM   CREATININE 0.90 07/30/2021 11:28 AM   GFRNONAA >60 08/13/2020 05:29 AM   GFRAA 84 05/09/2020 11:32 AM     Current antihyperglycemic regimen:  Januvia 110m daily- Pt taking 565mdaily  Patient verbally confirms she is taking the above medications as directed. Yes  What recent interventions/DTPs have been  made to improve glycemic control:  Pt Januvia was decreased to 503mue to having side effects   Have there been any recent hospitalizations or ED visits since last visit with CPP? No  Patient denies hypoglycemic symptoms, including None  Patient denies hyperglycemic symptoms, including none  How often are you checking your blood sugar? in the morning before eating or drinking  What are your blood sugars ranging?  Fasting: 10/08/21 145 10/07/21 186 10/06/21 155 10/05/21 162 10/04/21 149 Pt stated when she eats high sugary foods the night before, it usually runs high.   On insulin? No  During the week, how often does your blood glucose drop below 70? Never  Are you checking your feet daily/regularly? Yes  Adherence Review: Is the patient currently on a STATIN medication? Yes Is the patient currently on ACE/ARB medication? Yes Does the patient have >5 day gap between last estimated fill dates? CPP to review  Care Gaps: Last eye exam / Retinopathy Screening? 03/21/21 Last Annual Wellness Visit? 09/13/21 Last Diabetic Foot Exam? 09/11/20    Star Rating Drugs:  Medication:  Last Fill: Day Supply Januvia   (no med fill hx)    DanElray McgregorMAOscarvillearmacist Assistant  [phone]

## 2021-10-09 ENCOUNTER — Other Ambulatory Visit: Payer: Self-pay

## 2021-10-09 DIAGNOSIS — R42 Dizziness and giddiness: Secondary | ICD-10-CM

## 2021-10-09 MED ORDER — ACCU-CHEK GUIDE VI STRP: ORAL_STRIP | 3 refills | Status: DC

## 2021-10-10 NOTE — Telephone Encounter (Signed)
Address at next appt. Recommend stricter diet and exercise. Dr [name]

## 2021-10-13 DIAGNOSIS — R059 Cough, unspecified: Secondary | ICD-10-CM | POA: Diagnosis not present

## 2021-10-13 DIAGNOSIS — J324 Chronic pansinusitis: Secondary | ICD-10-CM | POA: Diagnosis not present

## 2021-10-15 ENCOUNTER — Other Ambulatory Visit: Payer: Self-pay

## 2021-10-15 DIAGNOSIS — R42 Dizziness and giddiness: Secondary | ICD-10-CM

## 2021-10-15 MED ORDER — ACCU-CHEK GUIDE VI STRP: 1.0000 | ORAL_STRIP | Freq: Every day | 3 refills | Status: DC

## 2021-10-17 DIAGNOSIS — E119 Type 2 diabetes mellitus without complications: Secondary | ICD-10-CM | POA: Insufficient documentation

## 2021-10-18 ENCOUNTER — Ambulatory Visit: Payer: Medicare Other | Admitting: Cardiology

## 2021-10-25 DIAGNOSIS — B372 Candidiasis of skin and nail: Secondary | ICD-10-CM | POA: Diagnosis not present

## 2021-10-25 DIAGNOSIS — B3731 Acute candidiasis of vulva and vagina: Secondary | ICD-10-CM | POA: Diagnosis not present

## 2021-11-12 ENCOUNTER — Ambulatory Visit (INDEPENDENT_AMBULATORY_CARE_PROVIDER_SITE_OTHER): Payer: Medicare Other | Admitting: Family Medicine

## 2021-11-12 ENCOUNTER — Encounter: Payer: Self-pay | Admitting: Family Medicine

## 2021-11-12 VITALS — BP 128/62 | HR 52 | Temp 97.6°F | Ht 65.0 in | Wt 215.0 lb

## 2021-11-12 DIAGNOSIS — E1142 Type 2 diabetes mellitus with diabetic polyneuropathy: Secondary | ICD-10-CM | POA: Diagnosis not present

## 2021-11-12 DIAGNOSIS — K219 Gastro-esophageal reflux disease without esophagitis: Secondary | ICD-10-CM | POA: Diagnosis not present

## 2021-11-12 DIAGNOSIS — I152 Hypertension secondary to endocrine disorders: Secondary | ICD-10-CM

## 2021-11-12 DIAGNOSIS — Z953 Presence of xenogenic heart valve: Secondary | ICD-10-CM

## 2021-11-12 DIAGNOSIS — R413 Other amnesia: Secondary | ICD-10-CM

## 2021-11-12 DIAGNOSIS — E782 Mixed hyperlipidemia: Secondary | ICD-10-CM

## 2021-11-12 DIAGNOSIS — K644 Residual hemorrhoidal skin tags: Secondary | ICD-10-CM

## 2021-11-12 DIAGNOSIS — E1159 Type 2 diabetes mellitus with other circulatory complications: Secondary | ICD-10-CM

## 2021-11-12 DIAGNOSIS — Z6835 Body mass index (BMI) 35.0-35.9, adult: Secondary | ICD-10-CM

## 2021-11-12 DIAGNOSIS — I10 Essential (primary) hypertension: Secondary | ICD-10-CM | POA: Diagnosis not present

## 2021-11-12 HISTORY — DX: Residual hemorrhoidal skin tags: K64.4

## 2021-11-12 MED ORDER — HYDROCORTISONE ACETATE 25 MG RE SUPP: 25.0000 mg | Freq: Two times a day (BID) | RECTAL | 2 refills | Status: DC

## 2021-11-12 NOTE — Assessment & Plan Note (Signed)
The current medical regimen is effective;  continue present plan and medications.  Continue famotidine and omeprazole twice daily.   

## 2021-11-12 NOTE — Assessment & Plan Note (Signed)
Well controlled.  No changes to medicines. Chlorthalidone 50 mg daily, Valsartan 320 mg daily  Continue to work on eating a healthy diet and exercise.  Labs drawn today.   

## 2021-11-12 NOTE — Assessment & Plan Note (Signed)
Control: good Recommend check sugars fasting daily. Recommend check feet daily. Recommend annual eye exams. Medicines: Januvia 100 mg daily Continue to work on eating a healthy diet and exercise.  Labs drawn today.    

## 2021-11-12 NOTE — Progress Notes (Signed)
Subjective:  Patient ID: Megan Galvan, female    DOB: 11/29/1950  Age: 71 y.o. MRN: [id number]  Chief Complaint  Patient presents with   Diabetes   Hyperlipidemia   Hypertension   HPI: Diabetes: She presents for her follow-up diabetic visit. She has type 2 diabetes mellitus. Her disease course has been stable. Pertinent negatives for hypoglycemia include no dizziness, headaches or nervousness/anxiousness. Associated symptoms include fatigue and weakness. Pertinent negatives for diabetes include no chest pain. Symptoms are stable.  Complications: diabetic polyneuropathy. Glucose checking:  daily  Glucose logs: Blood sugar range 130-150.  Hypoglycemia:none Most recent A1C: 7.0 Current medications: Januvia 100 mg daily  Last Eye Exam: overdue Foot checks: daily.  Hyperlipidemia: Current medications:  Pravastatin 40 mg daily   Hypertension: Current medications:  Chlorthalidone 50 mg daily, Valsartan 320 mg daily.    GERD: well controlled on famotidine and omeprazole twice daily.   Diet: trying Exercise: none  Patient is having hemorrhoid flare up due to some constipation. Patient is taking miralax. Requesting anusol hc.    Current Outpatient Medications on File Prior to Visit  Medication Sig Dispense Refill   Accu-Chek Softclix Lancets lancets      acetaminophen (TYLENOL) 500 MG tablet Take 1,000 mg by mouth every 6 (six) hours as needed for moderate pain or headache.     aspirin EC 81 MG tablet Take 1 tablet (81 mg total) by mouth daily. Swallow whole. 90 tablet 3   azelastine (ASTELIN) 0.1 % nasal spray Place 2 sprays into both nostrils 2 (two) times daily.     Blood Glucose Monitoring Suppl (ACCU-CHEK GUIDE ME) w/Device KIT Use as directed 1 kit 0   Calcium Carb-Cholecalciferol (CALCIUM 600 + D PO) Take 2 tablets by mouth daily.     cetirizine (ZYRTEC) 10 MG tablet Take 10 mg by mouth daily.     chlorthalidone (HYGROTON) 50 MG tablet TAKE 1 TABLET BY MOUTH  DAILY 90  tablet 3   clotrimazole-betamethasone (LOTRISONE) cream Apply 1 application topically daily. 45 g 1   diltiazem (CARDIZEM) 30 MG tablet Take 1 tablet (30 mg total) by mouth every 6 (six) hours as needed (If your heart rate is greater than 100.). 30 tablet 3   famotidine (PEPCID) 40 MG tablet TAKE 1 TABLET BY MOUTH AT  BEDTIME 90 tablet 3   fluticasone (CUTIVATE) 0.05 % cream Apply topically 2 (two) times daily. For 10 days to rectum 30 g 2   fluticasone (FLONASE) 50 MCG/ACT nasal spray Place 1 spray into the nose daily as needed for allergies.     gabapentin (NEURONTIN) 100 MG capsule Take 1 capsule (100 mg total) by mouth 3 (three) times daily. 90 capsule 3   glucose blood (ACCU-CHEK GUIDE) test strip 1 each by Other route daily in the afternoon. 100 each 3   JANUVIA 100 MG tablet Take 1 tablet (100 mg total) by mouth daily. 90 tablet 1   ketoconazole (NIZORAL) 2 % cream Apply topically 2 (two) times daily as needed.     Melatonin 5 MG CAPS Take 5 mg by mouth at bedtime.     Multiple Vitamin (MULTIVITAMIN WITH MINERALS) TABS tablet Take 1 tablet by mouth daily.     nystatin ointment (MYCOSTATIN) Apply topically 2 (two) times daily.     omeprazole (PRILOSEC) 40 MG capsule TAKE 1 CAPSULE BY MOUTH  TWICE DAILY BEFORE MEALS 180 capsule 3   potassium chloride SA (KLOR-CON M) 20 MEQ tablet TAKE 2 TABLETS BY MOUTH  IN  THE MORNING AND 1 TABLET IN THE EVENING 270 tablet 3   pravastatin (PRAVACHOL) 40 MG tablet TAKE 1 TABLET BY MOUTH AT  BEDTIME 90 tablet 3   PREMARIN vaginal cream IFOYDXAJ:2.8 Applicator Vaginal Daily     PRESCRIPTION MEDICATION Apply 1 application topically 4 (four) times daily as needed (hemorrhoid). Nifedipine 5% ointment  Rx is sent to [facility]. Last called in on 07/19/2020 with 1 yr of refills.     traZODone (DESYREL) 100 MG tablet TAKE 1 TABLET BY MOUTH  BEFORE BEDTIME 90 tablet 3   valsartan (DIOVAN) 320 MG tablet TAKE 1 TABLET BY MOUTH  DAILY 90 tablet 3   linaclotide  (LINZESS) 145 MCG CAPS capsule Take 1 capsule (145 mcg total) by mouth daily before breakfast. 30 capsule 5   No current facility-administered medications on file prior to visit.   Past Medical History:  Diagnosis Date   Allergy    Arthritis    Cataract    Diabetes mellitus without complication (HCC)    Nonrheumatic aortic (valve) stenosis    Nonrheumatic aortic (valve) stenosis    Plantar fasciitis    S/P aortic valve replacement with bioprosthetic valve 08/10/2020   21 mm Edwards Resilia Inspiria Bioprosthetic Tissue Valve  SN [id number] Model 11500A   Sleep apnea    cpap   Past Surgical History:  Procedure Laterality Date   ANTERIOR CERVICAL DECOMP/DISCECTOMY FUSION     x 2   AORTIC VALVE REPLACEMENT N/A 08/10/2020   Procedure: AORTIC VALVE REPLACEMENT (AVR) USING INSPIRIS VALVE SIZE 21MM;  Surgeon: Rexene Alberts, MD;  Location: [place];  Service: Open Heart Surgery;  Laterality: N/A;   BACK SURGERY  11/17/2016   L3-L5   BILATERAL CARPAL TUNNEL RELEASE     bone spur Left    left shoulder   bone spur Right    Right shoulder   CATARACT EXTRACTION  07/2021   COLONOSCOPY  08/22/2011   Moderate predominantly sigmoid diverticulosis. Small internal hemorrhoids.    COLONOSCOPY     HEMORROIDECTOMY  02/2019   NECK SURGERY     RIGHT/LEFT HEART CATH AND CORONARY ANGIOGRAPHY N/A 07/27/2020   Procedure: RIGHT/LEFT HEART CATH AND CORONARY ANGIOGRAPHY;  Surgeon: Sherren Mocha, MD;  Location: [facility] [facility];  Service: Cardiovascular;  Laterality: N/A;   TEE WITHOUT CARDIOVERSION N/A 08/10/2020   Procedure: TRANSESOPHAGEAL ECHOCARDIOGRAM (TEE);  Surgeon: Rexene Alberts, MD;  Location: [place];  Service: Open Heart Surgery;  Laterality: N/A;   torn rotator cuff Right    Right shoulder   torn rotator cuff Left    left shoulder   TRIGGER FINGER RELEASE Left    thumb   TRIGGER FINGER RELEASE Right    thumb and middle finger   UPPER GASTROINTESTINAL ENDOSCOPY     WRIST SURGERY Left     torn ligament   WRIST SURGERY Right    cyst    Family History  Problem Relation Age of Onset   Diabetes Mother    Colon cancer Paternal Grandmother    Esophageal cancer Neg Hx    Rectal cancer Neg Hx    Stomach cancer Neg Hx    Breast cancer Neg Hx    Social History   Socioeconomic History   Marital status: Married    Spouse name: Eddie   Number of children: 2   Years of education: Not on file   Highest education level: Not on file  Occupational History   Not on file  Tobacco Use   Smoking status: Never   Smokeless tobacco: Never  Vaping Use   Vaping Use: Never used  Substance and Sexual Activity   Alcohol use: Never   Drug use: Never   Sexual activity: Not on file  Other Topics Concern   Not on file  Social History Narrative   Not on file   Social Determinants of Health   Financial Resource Strain: High Risk (01/04/2021)   Overall Financial Resource Strain (CARDIA)    Difficulty of Paying Living Expenses: Hard  Food Insecurity: No Food Insecurity (09/30/2019)   Hunger Vital Sign    Worried About Running Out of Food in the Last Year: Never true    Ran Out of Food in the Last Year: Never true  Transportation Needs: No Transportation Needs (09/17/2021)   PRAPARE - Hydrologist (Medical): No    Lack of Transportation (Non-Medical): No  Physical Activity: Not on file  Stress: Not on file  Social Connections: Not on file    Review of Systems  Constitutional:  Negative for appetite change, fatigue and fever.  HENT:  Negative for congestion, ear pain, sinus pressure and sore throat.   Respiratory:  Negative for cough, chest tightness, shortness of breath and wheezing.   Cardiovascular:  Negative for chest pain and palpitations.  Gastrointestinal:  Negative for abdominal pain, constipation, diarrhea, nausea and vomiting.  Genitourinary:  Negative for dysuria and hematuria.  Musculoskeletal:  Negative for arthralgias, back pain, joint  swelling and myalgias.  Skin:  Negative for rash.  Neurological:  Negative for dizziness, weakness and headaches.  Psychiatric/Behavioral:  Negative for dysphoric mood. The patient is not nervous/anxious.      Objective:  BP 128/62 (BP Location: Left Arm, Patient Position: Sitting)   Pulse (!) 52   Temp 97.6 F (36.4 C) (Oral)   Ht $R'5\' 5"'XD$  (1.651 m)   Wt 215 lb (97.5 kg)   SpO2 95%   BMI 35.78 kg/m      11/12/2021   10:21 AM 09/17/2021    2:09 PM 09/04/2021   10:38 AM  BP/Weight  Systolic BP 096 283 662  Diastolic BP 62 64 72  Wt. (Lbs) 215 216 216  BMI 35.78 kg/m2 35.94 kg/m2 35.94 kg/m2    Physical Exam Vitals reviewed.  Constitutional:      Appearance: Normal appearance. She is normal weight.  Cardiovascular:     Rate and Rhythm: Normal rate and regular rhythm.     Heart sounds: Normal heart sounds.  Pulmonary:     Effort: Pulmonary effort is normal.     Breath sounds: Normal breath sounds.  Abdominal:     General: Abdomen is flat. Bowel sounds are normal.     Palpations: Abdomen is soft.  Neurological:     Mental Status: She is alert and oriented to person, place, and time.  Psychiatric:        Mood and Affect: Mood normal.        Behavior: Behavior normal.     Diabetic Foot Exam - Simple   Simple Foot Form Diabetic Foot exam was performed with the following findings: Yes 11/12/2021  8:21 PM  Visual Inspection No deformities, no ulcerations, no other skin breakdown bilaterally: Yes Sensation Testing Intact to touch and monofilament testing bilaterally: Yes Pulse Check Posterior Tibialis and Dorsalis pulse intact bilaterally: Yes Comments      Lab Results  Component Value Date   WBC 6.3 09/04/2021   HGB 12.6  09/04/2021   HCT 36.9 09/04/2021   PLT 212 09/04/2021   GLUCOSE 148 (H) 09/04/2021   CHOL 126 07/13/2021   TRIG 99 07/13/2021   HDL 42 07/13/2021   LDLCALC 65 07/13/2021   ALT 29 09/04/2021   AST 34 09/04/2021   NA 142 09/04/2021   K 4.4  09/04/2021   CL 100 09/04/2021   CREATININE 1.02 (H) 09/04/2021   BUN 12 09/04/2021   CO2 28 09/04/2021   TSH 2.360 09/04/2021   INR 1.5 (H) 08/10/2020   HGBA1C 7.0 (H) 07/13/2021   MICROALBUR 10 12/13/2020      Assessment & Plan:   Problem List Items Addressed This Visit       Cardiovascular and Mediastinum   Hypertension associated with diabetes ([place]) (Chronic)    Well controlled.  No changes to medicines.    Chlorthalidone 50 mg daily, Valsartan 320 mg daily.   Continue to work on eating a healthy diet and exercise.  Labs drawn today.        Relevant Orders   CBC With Diff/Platelet   Comprehensive metabolic panel   External hemorrhoids    anusol hc suppository rx sent.         Digestive   Gastro-esophageal reflux disease without esophagitis    The current medical regimen is effective;  continue present plan and medications.  Continue famotidine and omeprazole twice daily.          Endocrine   Type 2 diabetes mellitus with diabetic polyneuropathy, without long-term current use of insulin (HCC) - Primary    Control: good Recommend check sugars fasting daily. Recommend check feet daily. Recommend annual eye exams. Medicines: Januvia 100 mg daily  Continue to work on eating a healthy diet and exercise.  Labs drawn today.         Relevant Orders   Hemoglobin A1c     Other   Class 2 severe obesity due to excess calories with serious comorbidity and body mass index (BMI) of 35.0 to 35.9 in adult [facility])    Recommend continue to work on eating healthy diet and exercise. Comorbidities include hypertension, diabetes, and hyperlipidemia.      Mixed hyperlipidemia    Well controlled.  No changes to medicines. Pravastatin 40 mg daily  Continue to work on eating a healthy diet and exercise.  Labs drawn today.        Relevant Orders   CBC With Diff/Platelet   Lipid panel   S/P aortic valve replacement with bioprosthetic valve   Memory loss    Ordered MRI  of brain.      Relevant Orders   MR Brain Wo Contrast  .  Meds ordered this encounter  Medications   hydrocortisone (ANUSOL-HC) 25 MG suppository    Sig: Place 1 suppository (25 mg total) rectally 2 (two) times daily.    Dispense:  30 suppository    Refill:  2    Orders Placed This Encounter  Procedures   MR Brain Wo Contrast   CBC With Diff/Platelet   Comprehensive metabolic panel   Lipid panel   Hemoglobin A1c     Follow-up: Return in about 4 months (around 03/15/2022) for chronic fasting.  An After Visit Summary was printed and given to the patient.  I,Lauren M Auman,acting as a scribe for Rochel Brome, MD.,have documented all relevant documentation on the behalf of Rochel Brome, MD,as directed by  Rochel Brome, MD while in the presence of Rochel Brome, MD.  Rochel Brome, MD [facility] [phone]

## 2021-11-12 NOTE — Assessment & Plan Note (Signed)
Ordered MRI of brain.

## 2021-11-12 NOTE — Assessment & Plan Note (Signed)
anusol hc suppository rx sent.

## 2021-11-12 NOTE — Assessment & Plan Note (Signed)
Recommend continue to work on eating healthy diet and exercise. Comorbidities include hypertension, diabetes, and hyperlipidemia.

## 2021-11-12 NOTE — Assessment & Plan Note (Signed)
Well controlled.  No changes to medicines. Pravastatin 40 mg daily Continue to work on eating a healthy diet and exercise.  Labs drawn today.   

## 2021-11-13 LAB — COMPREHENSIVE METABOLIC PANEL
ALT: 28 IU/L (ref 0–32)
AST: 29 IU/L (ref 0–40)
Albumin/Globulin Ratio: 1.5 (ref 1.2–2.2)
Albumin: 4.2 g/dL (ref 3.9–4.9)
Alkaline Phosphatase: 65 IU/L (ref 44–121)
BUN/Creatinine Ratio: 15 (ref 12–28)
BUN: 13 mg/dL (ref 8–27)
Bilirubin Total: 0.5 mg/dL (ref 0.0–1.2)
CO2: 31 mmol/L — ABNORMAL HIGH (ref 20–29)
Calcium: 9.3 mg/dL (ref 8.7–10.3)
Chloride: 95 mmol/L — ABNORMAL LOW (ref 96–106)
Creatinine, Ser: 0.89 mg/dL (ref 0.57–1.00)
Globulin, Total: 2.8 g/dL (ref 1.5–4.5)
Glucose: 164 mg/dL — ABNORMAL HIGH (ref 70–99)
Potassium: 3.7 mmol/L (ref 3.5–5.2)
Sodium: 138 mmol/L (ref 134–144)
Total Protein: 7 g/dL (ref 6.0–8.5)
eGFR: 70 mL/min/{1.73_m2} (ref >59)

## 2021-11-13 LAB — CBC WITH DIFF/PLATELET
Basophils Absolute: 0.1 10*3/uL (ref 0.0–0.2)
Basos: 1 %
EOS (ABSOLUTE): 0.2 10*3/uL (ref 0.0–0.4)
Eos: 3 %
Hematocrit: 35.8 % (ref 34.0–46.6)
Hemoglobin: 12.2 g/dL (ref 11.1–15.9)
Immature Grans (Abs): 0 10*3/uL (ref 0.0–0.1)
Immature Granulocytes: 0 %
Lymphocytes Absolute: 2.3 10*3/uL (ref 0.7–3.1)
Lymphs: 32 %
MCH: 33.2 pg — ABNORMAL HIGH (ref 26.6–33.0)
MCHC: 34.1 g/dL (ref 31.5–35.7)
MCV: 97 fL (ref 79–97)
Monocytes Absolute: 0.8 10*3/uL (ref 0.1–0.9)
Monocytes: 11 %
Neutrophils Absolute: 3.8 10*3/uL (ref 1.4–7.0)
Neutrophils: 53 %
Platelets: 205 10*3/uL (ref 150–450)
RBC: 3.68 x10E6/uL — ABNORMAL LOW (ref 3.77–5.28)
RDW: 12.5 % (ref 11.7–15.4)
WBC: 7.2 10*3/uL (ref 3.4–10.8)

## 2021-11-13 LAB — LIPID PANEL
Chol/HDL Ratio: 3 ratio (ref 0.0–4.4)
Cholesterol, Total: 130 mg/dL (ref 100–199)
HDL: 43 mg/dL (ref >39)
LDL Chol Calc (NIH): 66 mg/dL (ref 0–99)
Triglycerides: 118 mg/dL (ref 0–149)
VLDL Cholesterol Cal: 21 mg/dL (ref 5–40)

## 2021-11-13 LAB — HEMOGLOBIN A1C
Est. average glucose Bld gHb Est-mCnc: 163 mg/dL
Hgb A1c MFr Bld: 7.3 % — ABNORMAL HIGH (ref 4.8–5.6)

## 2021-11-13 LAB — CARDIOVASCULAR RISK ASSESSMENT

## 2021-11-16 DIAGNOSIS — B3732 Chronic candidiasis of vulva and vagina: Secondary | ICD-10-CM | POA: Diagnosis not present

## 2021-11-21 ENCOUNTER — Ambulatory Visit (INDEPENDENT_AMBULATORY_CARE_PROVIDER_SITE_OTHER): Payer: Medicare Other | Admitting: Cardiology

## 2021-11-21 ENCOUNTER — Encounter: Payer: Self-pay | Admitting: *Deleted

## 2021-11-21 VITALS — BP 114/60 | HR 64 | Ht 65.0 in | Wt 214.0 lb

## 2021-11-21 DIAGNOSIS — I480 Paroxysmal atrial fibrillation: Secondary | ICD-10-CM | POA: Diagnosis not present

## 2021-11-21 DIAGNOSIS — I10 Essential (primary) hypertension: Secondary | ICD-10-CM

## 2021-11-21 DIAGNOSIS — E782 Mixed hyperlipidemia: Secondary | ICD-10-CM

## 2021-11-21 DIAGNOSIS — Z953 Presence of xenogenic heart valve: Secondary | ICD-10-CM | POA: Diagnosis not present

## 2021-11-21 DIAGNOSIS — G4733 Obstructive sleep apnea (adult) (pediatric): Secondary | ICD-10-CM | POA: Diagnosis not present

## 2021-11-21 NOTE — Progress Notes (Signed)
Cardiology Office Note:    Date:  11/21/2021   ID:  Megan Galvan, DOB March 30, 1951, MRN [id number]  PCP:  Megan Brome, MD  Cardiologist:  Megan More, MD    Referring MD: Megan Brome, MD    ASSESSMENT:    1. S/P aortic valve replacement with bioprosthetic valve   2. Paroxysmal atrial fibrillation (HCC)   3. Essential hypertension   4. Mixed hyperlipidemia    PLAN:    In order of problems listed above:  She made a good recovery after surgical aortic valve replacement did not have the immediate postoperative echocardiogram will need to do what is baseline and continue low-dose aspirin Stable no recurrence bilaterally postoperative atrial fibrillation off anticoagulation off antiarrhythmic drug Well-controlled continue treatment with ARB Stable continue her statin is controlled her lipids LDL at target   Next appointment: 1 year   Medication Adjustments/Labs and Tests Ordered: Current medicines are reviewed at length with the patient today.  Concerns regarding medicines are outlined above.  No orders of the defined types were placed in this encounter.  No orders of the defined types were placed in this encounter.   Chief Complaint  Patient presents with   Follow-up    After SAVR     History of Present Illness:    Megan Galvan is a 71 y.o. female with a hx of aortic stenosis with surgical AVR 56/38/9373 complicated by postoperative atrial fibrillation other problems include hyper lipidemia last seen by me 02/05/2021 off amiodarone not anticoagulated and maintaining sinus rhythm.   Compliance with diet, lifestyle and medications: Yes  She continues to do well fully recovered from her aortic valve replacement She has no symptoms of edema shortness of breath chest pain palpitation or syncope She tolerates her statin without muscle pain or weakness and LDL is at target Home blood pressures running 130s over 70s She has had no fever chills Past Medical History:   Diagnosis Date   Allergy    Arthritis    Cataract    Diabetes mellitus without complication (HCC)    Nonrheumatic aortic (valve) stenosis    Nonrheumatic aortic (valve) stenosis    Plantar fasciitis    S/P aortic valve replacement with bioprosthetic valve 08/10/2020   21 mm Edwards Resilia Inspiria Bioprosthetic Tissue Valve  SN [id number] Model 11500A   Sleep apnea    cpap    Past Surgical History:  Procedure Laterality Date   ANTERIOR CERVICAL DECOMP/DISCECTOMY FUSION     x 2   AORTIC VALVE REPLACEMENT N/A 08/10/2020   Procedure: AORTIC VALVE REPLACEMENT (AVR) USING INSPIRIS VALVE SIZE 21MM;  Surgeon: Megan Alberts, MD;  Location: [place];  Service: Open Heart Surgery;  Laterality: N/A;   BACK SURGERY  11/17/2016   L3-L5   BILATERAL CARPAL TUNNEL RELEASE     bone spur Left    left shoulder   bone spur Right    Right shoulder   CATARACT EXTRACTION Left 09/18/2021   COLONOSCOPY  08/22/2011   Moderate predominantly sigmoid diverticulosis. Small internal hemorrhoids.    COLONOSCOPY     HEMORROIDECTOMY  02/2019   NECK SURGERY     RIGHT/LEFT HEART CATH AND CORONARY ANGIOGRAPHY N/A 07/27/2020   Procedure: RIGHT/LEFT HEART CATH AND CORONARY ANGIOGRAPHY;  Surgeon: Sherren Mocha, MD;  Location: [place] [facility];  Service: Cardiovascular;  Laterality: N/A;   TEE WITHOUT CARDIOVERSION N/A 08/10/2020   Procedure: TRANSESOPHAGEAL ECHOCARDIOGRAM (TEE);  Surgeon: Megan Alberts, MD;  Location: [place];  Service:  Open Heart Surgery;  Laterality: N/A;   torn rotator cuff Right    Right shoulder   torn rotator cuff Left    left shoulder   TRIGGER FINGER RELEASE Left    thumb   TRIGGER FINGER RELEASE Right    thumb and middle finger   UPPER GASTROINTESTINAL ENDOSCOPY     WRIST SURGERY Left    torn ligament   WRIST SURGERY Right    cyst    Current Medications: Current Meds  Medication Sig   Accu-Chek Softclix Lancets lancets    acetaminophen (TYLENOL) 500 MG tablet Take 1,000  mg by mouth every 6 (six) hours as needed for moderate pain or headache.   aspirin EC 81 MG tablet Take 1 tablet (81 mg total) by mouth daily. Swallow whole.   Blood Glucose Monitoring Suppl (ACCU-CHEK GUIDE ME) w/Device KIT Use as directed   Calcium Carb-Cholecalciferol (CALCIUM 600 + D PO) Take 2 tablets by mouth daily.   cetirizine (ZYRTEC) 10 MG tablet Take 10 mg by mouth daily.   chlorthalidone (HYGROTON) 50 MG tablet TAKE 1 TABLET BY MOUTH  DAILY   diltiazem (CARDIZEM) 30 MG tablet Take 1 tablet (30 mg total) by mouth every 6 (six) hours as needed (If your heart rate is greater than 100.).   famotidine (PEPCID) 40 MG tablet TAKE 1 TABLET BY MOUTH AT  BEDTIME   fluticasone (FLONASE) 50 MCG/ACT nasal spray Place 1 spray into the nose daily as needed for allergies.   gabapentin (NEURONTIN) 100 MG capsule Take 1 capsule (100 mg total) by mouth 3 (three) times daily.   glucose blood (ACCU-CHEK GUIDE) test strip 1 each by Other route daily in the afternoon.   hydrocortisone (ANUSOL-HC) 25 MG suppository Place 1 suppository (25 mg total) rectally 2 (two) times daily.   JANUVIA 100 MG tablet Take 1 tablet (100 mg total) by mouth daily.   linaclotide (LINZESS) 145 MCG CAPS capsule Take 1 capsule (145 mcg total) by mouth daily before breakfast.   Melatonin 5 MG CAPS Take 5 mg by mouth at bedtime.   Multiple Vitamin (MULTIVITAMIN WITH MINERALS) TABS tablet Take 1 tablet by mouth daily.   omeprazole (PRILOSEC) 40 MG capsule TAKE 1 CAPSULE BY MOUTH  TWICE DAILY BEFORE MEALS   potassium chloride SA (KLOR-CON M) 20 MEQ tablet TAKE 2 TABLETS BY MOUTH IN  THE MORNING AND 1 TABLET IN THE EVENING   pravastatin (PRAVACHOL) 40 MG tablet TAKE 1 TABLET BY MOUTH AT  BEDTIME   PREMARIN vaginal cream DDUKGURK:2.7 Applicator Vaginal Daily   traZODone (DESYREL) 100 MG tablet TAKE 1 TABLET BY MOUTH  BEFORE BEDTIME   valsartan (DIOVAN) 320 MG tablet TAKE 1 TABLET BY MOUTH  DAILY     Allergies:   Ace inhibitors,  Farxiga [dapagliflozin], Jardiance [empagliflozin], Keflex [cephalexin], Latex, Metformin and related, Nexium [esomeprazole magnesium], Protonix [pantoprazole sodium], and Doxycycline   Social History   Socioeconomic History   Marital status: Married    Spouse name: Eddie   Number of children: 2   Years of education: Not on file   Highest education level: Not on file  Occupational History   Not on file  Tobacco Use   Smoking status: Never   Smokeless tobacco: Never  Vaping Use   Vaping Use: Never used  Substance and Sexual Activity   Alcohol use: Never   Drug use: Never   Sexual activity: Not on file  Other Topics Concern   Not on file  Social History Narrative  Not on file   Social Determinants of Health   Financial Resource Strain: High Risk (01/04/2021)   Overall Financial Resource Strain (CARDIA)    Difficulty of Paying Living Expenses: Hard  Food Insecurity: No Food Insecurity (09/30/2019)   Hunger Vital Sign    Worried About Running Out of Food in the Last Year: Never true    Ran Out of Food in the Last Year: Never true  Transportation Needs: No Transportation Needs (09/17/2021)   PRAPARE - Hydrologist (Medical): No    Lack of Transportation (Non-Medical): No  Physical Activity: Not on file  Stress: Not on file  Social Connections: Not on file     Family History: The patient's family history includes Colon cancer in her paternal grandmother; Diabetes in her mother. There is no history of Esophageal cancer, Rectal cancer, Stomach cancer, or Breast cancer. ROS:   Please see the history of present illness.    All other systems reviewed and are negative.  EKGs/Labs/Other Studies Reviewed:    The following studies were reviewed today:  EKG:  EKG ordered today and personally reviewed.  The ekg ordered today demonstrates sinus rhythm what I would interpret is poor R wave progression otherwise normal EKG  Recent Labs: 09/04/2021: TSH  2.360 11/12/2021: ALT 28; BUN 13; Creatinine, Ser 0.89; Hemoglobin 12.2; Platelets 205; Potassium 3.7; Sodium 138  Recent Lipid Panel    Component Value Date/Time   CHOL 130 11/12/2021 1112   TRIG 118 11/12/2021 1112   HDL 43 11/12/2021 1112   CHOLHDL 3.0 11/12/2021 1112   LDLCALC 66 11/12/2021 1112    Physical Exam:    VS:  BP 114/60 (BP Location: Right Arm, Patient Position: Sitting, Cuff Size: Normal)   Pulse 64   Ht '5\' 5"'  (1.651 m)   Wt 214 lb (97.1 kg)   SpO2 95%   BMI 35.61 kg/m     Wt Readings from Last 3 Encounters:  11/21/21 214 lb (97.1 kg)  11/12/21 215 lb (97.5 kg)  09/17/21 216 lb (98 kg)     GEN:  Well nourished, well developed in no acute distress HEENT: Normal NECK: No JVD; No carotid bruits LYMPHATICS: No lymphadenopathy CARDIAC: 1/6 ejection murmur no aortic regurgitation normal S2 RRR,  RESPIRATORY:  Clear to auscultation without rales, wheezing or rhonchi  ABDOMEN: Soft, non-tender, non-distended MUSCULOSKELETAL:  No edema; No deformity  SKIN: Warm and dry NEUROLOGIC:  Alert and oriented x 3 PSYCHIATRIC:  Normal affect    Signed, Megan More, MD  11/21/2021 2:38 PM    [place]

## 2021-11-21 NOTE — Patient Instructions (Signed)
Medication Instructions:  ?Your physician recommends that you continue on your current medications as directed. Please refer to the Current Medication list given to you today. ? ?*If you need a refill on your cardiac medications before your next appointment, please call your pharmacy* ? ? ?Lab Work: ?None ?If you have labs (blood work) drawn today and your tests are completely normal, you will receive your results only by: ?MyChart Message (if you have MyChart) OR ?A paper copy in the mail ?If you have any lab test that is abnormal or we need to change your treatment, we will call you to review the results. ? ? ?Testing/Procedures: ?Your physician has requested that you have an echocardiogram. Echocardiography is a painless test that uses sound waves to create images of your heart. It provides your doctor with information about the size and shape of your heart and how well your heart?s chambers and valves are working. This procedure takes approximately one hour. There are no restrictions for this procedure. ? ? ? ?Follow-Up: ?At [facility], you and your health needs are our priority.  As part of our continuing mission to provide you with exceptional heart care, we have created designated Provider Care Teams.  These Care Teams include your primary Cardiologist (physician) and Advanced Practice Providers (APPs -  Physician Assistants and Nurse Practitioners) who all work together to provide you with the care you need, when you need it. ? ?We recommend signing up for the patient portal called "MyChart".  Sign up information is provided on this After Visit Summary.  MyChart is used to connect with patients for Virtual Visits (Telemedicine).  Patients are able to view lab/test results, encounter notes, upcoming appointments, etc.  Non-urgent messages can be sent to your provider as well.   ?To learn more about what you can do with MyChart, go to [email or web address].   ? ?Your next appointment:   ?1 year(s) ? ?The  format for your next appointment:   ?In Person ? ?Provider:   ?Brian Munley, MD  ? ? ?Other Instructions ?None ? ?Important Information About Sugar ? ? ? ? ? ? ?

## 2021-11-27 ENCOUNTER — Ambulatory Visit
Admission: RE | Admit: 2021-11-27 | Discharge: 2021-11-27 | Disposition: A | Discharge status: Home / Self Care | Payer: Medicare Other | Source: Ambulatory Visit | Attending: Family Medicine | Admitting: Family Medicine

## 2021-11-27 ENCOUNTER — Other Ambulatory Visit: Payer: Self-pay | Admitting: Family Medicine

## 2021-11-27 DIAGNOSIS — R413 Other amnesia: Secondary | ICD-10-CM | POA: Diagnosis not present

## 2021-11-28 ENCOUNTER — Ambulatory Visit (INDEPENDENT_AMBULATORY_CARE_PROVIDER_SITE_OTHER): Payer: Medicare Other

## 2021-11-28 ENCOUNTER — Other Ambulatory Visit: Payer: Self-pay | Admitting: Family Medicine

## 2021-11-28 DIAGNOSIS — E782 Mixed hyperlipidemia: Secondary | ICD-10-CM

## 2021-11-28 DIAGNOSIS — I480 Paroxysmal atrial fibrillation: Secondary | ICD-10-CM | POA: Diagnosis not present

## 2021-11-28 DIAGNOSIS — Z953 Presence of xenogenic heart valve: Secondary | ICD-10-CM | POA: Diagnosis not present

## 2021-11-28 DIAGNOSIS — I10 Essential (primary) hypertension: Secondary | ICD-10-CM

## 2021-11-28 LAB — ECHOCARDIOGRAM COMPLETE
AR max vel: 0.94 cm2
AV Area VTI: 0.98 cm2
AV Area mean vel: 0.95 cm2
AV Mean grad: 14 mmHg
AV Peak grad: 23 mmHg
Ao pk vel: 2.4 m/s
Area-P 1/2: 2.59 cm2
S' Lateral: 2.5 cm

## 2021-12-05 ENCOUNTER — Ambulatory Visit (INDEPENDENT_AMBULATORY_CARE_PROVIDER_SITE_OTHER): Payer: Medicare Other

## 2021-12-05 DIAGNOSIS — E782 Mixed hyperlipidemia: Secondary | ICD-10-CM

## 2021-12-05 DIAGNOSIS — K219 Gastro-esophageal reflux disease without esophagitis: Secondary | ICD-10-CM

## 2021-12-05 DIAGNOSIS — E1142 Type 2 diabetes mellitus with diabetic polyneuropathy: Secondary | ICD-10-CM

## 2021-12-05 NOTE — Patient Instructions (Signed)
Visit Information   Goals Addressed   None    Patient Care Plan: CCM Pharmacy Care Plan     Problem Identified: dm, htn, hld, gerd   Priority: High  Onset Date: 06/28/2020     Long-Range Goal: disease management   Start Date: 06/28/2020  Expected End Date: 06/28/2021  Recent Progress: On track  Priority: High  Note:    Current Barriers:  Patient working to increase exercise but dealing with shortness of breath and chest pain episodes.    Pharmacist Clinical Goal(s):  Over the next 90 days, patient will achieve control of chest pain episodes as evidenced by ability to exercise and avoid chest pain episodes.  through collaboration with PharmD and provider.   Interventions: 1:1 collaboration with Cox, Elnita Maxwell, MD regarding development and update of comprehensive plan of care as evidenced by provider attestation and co-signature Inter-disciplinary care team collaboration (see longitudinal plan of care) Comprehensive medication review performed; medication list updated in electronic medical record  Hypertension (BP goal <140/90) BP Readings from Last 3 Encounters:  11/21/21 114/60  11/12/21 128/62  09/17/21 130/64  -Controlled -Current treatment: chlorthalidone 50 mg am and hs Appropriate, Effective, Safe, Accessible Diltiazem 75m prn Appropriate, Effective, Safe, Accessible Valsartan 320 mg daily Appropriate, Effective, Safe, Accessible -Medications previously tried: ace inhibitors, Metoprolol -Current home readings:   01/04/21: Readings from past few days Checks Daily AM:  148/81 (Ate a lot of salty pasta) 127/69 125/78 133/68 August 2023: Didn't have readings on her -Current dietary habits: trying to eat healthier for diabetes control.  -Current exercise habits: working to increase and start working more  -Reports hypotensive/hypertensive symptoms -Educated on BP goals and benefits of medications for prevention of heart attack, stroke and kidney damage; Daily salt  intake goal < 2300 mg; Exercise goal of 150 minutes per week; Importance of home blood pressure monitoring; -Counseled to monitor BP at home daily, document, and provide log at future appointments -Counseled on diet and exercise extensively Recommended to continue current medication September 2022: Extensive time spent on counseling patient on blood pressure goal and impact of each antihypertensive medication on their blood pressure and risk reduction for CV disease.  Used analogies to explain the need for multiple antihypertensive medications to achieve BP goals and that it is often a silent disease with no symptoms.  -Recommended to continue current medication   Hyperlipidemia: (LDL goal < 70) The 10-year ASCVD risk score (Arnett DK, et al., 2019) is: 17.3%   Values used to calculate the score:     Age: [age]years     Sex: Female     Is Non-Hispanic African American: No     Diabetic: Yes     Tobacco smoker: No     Systolic Blood Pressure: 1193mmHg     Is BP treated: Yes     HDL Cholesterol: 43 mg/dL     Total Cholesterol: 130 mg/dL Lab Results  Component Value Date   CHOL 130 11/12/2021   CHOL 126 07/13/2021   CHOL 143 12/13/2020   Lab Results  Component Value Date   HDL 43 11/12/2021   HDL 42 07/13/2021   HDL 55 12/13/2020   Lab Results  Component Value Date   LDLCALC 66 11/12/2021   LDLCALC 65 07/13/2021   LDLCALC 71 12/13/2020   Lab Results  Component Value Date   TRIG 118 11/12/2021   TRIG 99 07/13/2021   TRIG 89 12/13/2020   Lab Results  Component Value Date   CHOLHDL  3.0 11/12/2021   CHOLHDL 3.0 07/13/2021   CHOLHDL 2.6 12/13/2020  No results found for: "LDLDIRECT" -Controlled -Current treatment: Pravastatin 40 mg daily at bedtime Appropriate, Effective, Safe, Accessible -Medications previously tried: none reported  -Current dietary patterns: healthier diet and working to increase protein in diet -Current exercise habits: working to increase activity.   -Educated on Cholesterol goals;  Benefits of statin for ASCVD risk reduction; Importance of limiting foods high in cholesterol; Exercise goal of 150 minutes per week; -Counseled on diet and exercise extensively September 2022: Patient open to trying high intensity statin  Diabetes (A1c goal <7%) Lab Results  Component Value Date   HGBA1C 7.3 (H) 11/12/2021   HGBA1C 7.0 (H) 07/13/2021   HGBA1C 6.6 (H) 03/16/2021   Lab Results  Component Value Date   MICROALBUR 10 12/13/2020   LDLCALC 66 11/12/2021   CREATININE 0.89 11/12/2021   Lab Results  Component Value Date   NA 138 11/12/2021   K 3.7 11/12/2021   CREATININE 0.89 11/12/2021   EGFR 70 11/12/2021   GFRNONAA >60 08/13/2020   GLUCOSE 164 (H) 11/12/2021   Lab Results  Component Value Date   WBC 7.2 11/12/2021   HGB 12.2 11/12/2021   HCT 35.8 11/12/2021   MCV 97 11/12/2021   PLT 205 11/12/2021  -Controlled -Current medications: accu-chek softclix daily aviva plus test strips daily  Sitagliptin (PAP, yearly in December) Appropriate, Effective, Safe, Accessible -Medications previously tried: metformin IR(Upset stomach), SGLT2(-) (Yeast infection) -Current home glucose readings fasting glucose:  June 2023: 10/08/21 145 10/07/21 186 10/06/21 155 10/05/21 162 10/04/21 149  August 2023: 12/05/21: 155 12/04/21: 144 12/03/21: 131 12/02/21: 148 12/01/21: 150 -Denies hypoglycemic/hyperglycemic symptoms -Current meal patterns:  breakfast: working to get protein and control sugar with options - eggs   Lunch and dinner: loves spaghetti and trying to limit eating it -Current exercise: trying to exercise some -  having some shortness of breath or chest pain being evaluated by cardiologist.  -Educated onA1c and blood sugar goals; Complications of diabetes including kidney damage, retinal damage, and cardiovascular disease; Exercise goal of 150 minutes per week; Benefits of routine self-monitoring of blood sugar; Carbohydrate counting  and/or plate method -Counseled to check feet daily and get yearly eye exams -Counseled on diet and exercise extensively September 2022: Will schedule f/u for December to start PAP process December 2022: Patient got PAP form in the mail and brought it into office. Coordinated with CCM team to determine if/when it was mailed out. August 2023: Sugars running high, recommend very low dose Metformin ER (Had GI issues on 1058m IR QD)  GERD  (Goal: control acid reflux symptoms ) -Controlled -Current treatment  famotidine 40 mg daily at bedtime Query Appropriate,  Omeprazole 40 mg daily Query Appropriate,  -Medications previously tried: none reported -Recommended to continue current medication August 2023: Will talk with PCP about potentially tapering off to PRN  Constipation -Controlled -Current treatment  Amitiza 222m Appropriate, Effective, Safe, Accessible -Medications previously tried:   - September 2022: Patient asked if she has to take this the rest of her life. I told her that some people do and some don't, the only way to know is to try. She has f/u with Dr. GuLyndel Safeegarding Constipation next month, she will ask doctor about doing a trial without at some point in the future when she's ready August 2023: Will re-do PAP in December     Patient Goals/Self-Care Activities Over the next 180 days, patient will:  - take  medications as prescribed focus on medication adherence by using pill box check glucose daily, document, and provide at future appointments check blood pressure daily, document, and provide at future appointments target a minimum of 150 minutes of moderate intensity exercise weekly  Follow Up Plan: Telephone follow up appointment with care management team member scheduled for: June 2024  [place] Constable, Sherian Rein.D. - [phone]      Ms. Haeberle was given information about Chronic Care Management services today including:  CCM service includes personalized support  from designated clinical staff supervised by her physician, including individualized plan of care and coordination with other care providers 24/7 contact phone numbers for assistance for urgent and routine care needs. Standard insurance, coinsurance, copays and deductibles apply for chronic care management only during months in which we provide at least 20 minutes of these services. Most insurances cover these services at 100%, however patients may be responsible for any copay, coinsurance and/or deductible if applicable. This service may help you avoid the need for more expensive face-to-face services. Only one practitioner may furnish and bill the service in a calendar month. The patient may stop CCM services at any time (effective at the end of the month) by phone call to the office staff.  Patient agreed to services and verbal consent obtained.   The patient verbalized understanding of instructions, educational materials, and care plan provided today and DECLINED offer to receive copy of patient instructions, educational materials, and care plan.  The pharmacy team will reach out to the patient again over the next 60 days.   Lane Hacker, [place]

## 2021-12-05 NOTE — Progress Notes (Signed)
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 In her free time, she enjoys going to church each weekend  Recommendations/Changes made from today's visit: -Coordinated PAP for 2024 -Sugars are staying elevated, recommend Metformin ER 517m QD (Patient had GI discomfort on 1001mQD IR) June 2023: 10/08/21 145 10/07/21 186 10/06/21 155 10/05/21 162 10/04/21 149  August 2023: 12/05/21: 155 12/04/21: 144 12/03/21: 131 12/02/21: 148 12/01/21: 150    Subjective: Megan Galvan an [age].o. year old female who is a primary patient of Cox, Kirsten, MD.  The CCM team was consulted for assistance with disease management and care coordination needs.    Engaged with patient by telephone for follow up visit in response to provider referral for pharmacy case management and/or care coordination services.   Consent to Services:  The patient was given the following information about Chronic Care Management services today, agreed to services, and gave verbal consent: 1. CCM service includes personalized support from designated clinical staff supervised by the primary care provider, including individualized plan of care and coordination with other care providers 2. 24/7 contact phone numbers for assistance for urgent and routine care needs. 3. Service will only be billed when office clinical staff spend 20 minutes or more in a month to coordinate care. 4. Only one practitioner may furnish and bill the service in a calendar month. 5.The patient may stop CCM services at any time (effective at the end of the month) by phone call to the office staff. 6. The patient will be responsible for cost sharing (co-pay) of up to 20% of the service  fee (after annual deductible is met). Patient agreed to services and consent obtained.  Patient Care Team: CoRochel BromeMD as PCP - General (Family Medicine) [facility]Lane HackerRPM[facility] Pharmacist (Pharmacist)  Recent office visits:  09/19/21 CoRochel BromeD. Telephone Encounter. Restart Januvia 5027maily   09/13/21 SmiRhae HammockN. Medicare Annual Wellness. No med changes.   09/04/21 CoxRochel Brome. Seen for Dysuria. Started on Fluconazole 150m64mabapentin 100mg46mop januvia, At that time I would consider starting alternative medicine (rybelsus.) May increase premarin cream to once daily x 1 week, then return to 2-3 times per week   Recent consult visits:  None   Hospital visits:  None   Objective:  Lab Results  Component Value Date   CREATININE 0.89 11/12/2021   BUN 13 11/12/2021   GFRNONAA >60 08/13/2020   GFRAA 84 05/09/2020   NA 138 11/12/2021   K 3.7 11/12/2021   CALCIUM 9.3 11/12/2021   CO2 31 (H) 11/12/2021   GLUCOSE 164 (H) 11/12/2021    Lab Results  Component Value Date/Time   HGBA1C 7.3 (H) 11/12/2021 11:12 AM   HGBA1C 7.0 (H) 07/13/2021 11:25 AM   MICROALBUR 10 12/13/2020 10:44 AM   MICROALBUR 10 05/09/2020 11:32 AM    Last diabetic Eye exam:  Lab Results  Component Value Date/Time   HMDIABEYEEXA No Retinopathy 03/21/2021 12:00 AM    Last diabetic Foot exam: No results found for: "HMDIABFOOTEX"   Lab Results  Component Value Date   CHOL 130 11/12/2021   HDL 43 11/12/2021   LDLCALC 66  11/12/2021   TRIG 118 11/12/2021   CHOLHDL 3.0 11/12/2021       Latest Ref Rng & Units 11/12/2021   11:12 AM 09/04/2021   10:24 AM 07/30/2021   11:28 AM  Hepatic Function  Total Protein 6.0 - 8.5 g/dL 7.0  7.4  6.6   Albumin 3.9 - 4.9 g/dL 4.2  4.2  4.1   AST 0 - 40 IU/L 29  34  31   ALT 0 - 32 IU/L 28  29  32   Alk Phosphatase 44 - 121 IU/L 65  70  67   Total Bilirubin 0.0 - 1.2 mg/dL 0.5  0.3  0.3     Lab Results  Component Value Date/Time   TSH 2.360  09/04/2021 10:24 AM   TSH 1.180 03/16/2021 10:28 AM       Latest Ref Rng & Units 11/12/2021   11:12 AM 09/04/2021   10:24 AM 07/13/2021   11:25 AM  CBC  WBC 3.4 - 10.8 x10E3/uL 7.2  6.3  6.3   Hemoglobin 11.1 - 15.9 g/dL 12.2  12.6  12.8   Hematocrit 34.0 - 46.6 % 35.8  36.9  38.0   Platelets 150 - 450 x10E3/uL 205  212  244     Lab Results  Component Value Date/Time   VD25OH 56.2 03/16/2021 10:28 AM    Clinical ASCVD: Yes  The 10-year ASCVD risk score (Arnett DK, et al., 2019) is: 17.3%   Values used to calculate the score:     Age: 8 years     Sex: Female     Is Non-Hispanic African American: No     Diabetic: Yes     Tobacco smoker: No     Systolic Blood Pressure: 528 mmHg     Is BP treated: Yes     HDL Cholesterol: 43 mg/dL     Total Cholesterol: 130 mg/dL       09/17/2021    2:16 PM 07/13/2021   10:55 AM 06/14/2021    3:55 PM  Depression screen PHQ 2/9  Decreased Interest 0 0 2  Down, Depressed, Hopeless 0 0 0  PHQ - 2 Score 0 0 2  Altered sleeping   0  Tired, decreased energy   0  Change in appetite   0  Feeling bad or failure about yourself    0  Trouble concentrating   0  Moving slowly or fidgety/restless   0  Suicidal thoughts   0  PHQ-9 Score   2  Difficult doing work/chores   Not difficult at all     Other: (CHADS2VASc if Afib, MMRC or CAT for COPD, ACT, DEXA)  Social History   Tobacco Use  Smoking Status Never  Smokeless Tobacco Never   BP Readings from Last 3 Encounters:  11/21/21 114/60  11/12/21 128/62  09/17/21 130/64   Pulse Readings from Last 3 Encounters:  11/21/21 64  11/12/21 (!) 52  09/17/21 62   Wt Readings from Last 3 Encounters:  11/21/21 214 lb (97.1 kg)  11/12/21 215 lb (97.5 kg)  09/17/21 216 lb (98 kg)   BMI Readings from Last 3 Encounters:  11/21/21 35.61 kg/m  11/12/21 35.78 kg/m  09/17/21 35.94 kg/m    Assessment/Interventions: Review of patient past medical history, allergies, medications, health status,  including review of consultants reports, laboratory and other test data, was performed as part of comprehensive evaluation and provision of chronic care management services.   SDOH:  (Social Determinants of Health)  assessments and interventions performed: Yes SDOH Interventions    Flowsheet Row Most Recent Value  SDOH Interventions   Financial Strain Interventions Other (Comment)  [PAP]  Transportation Interventions Intervention Not Indicated       SDOH Screenings   Alcohol Screen: Low Risk  (09/17/2021)   Alcohol Screen    Last Alcohol Screening Score (AUDIT): 0  Depression (PHQ2-9): Low Risk  (09/17/2021)   Depression (PHQ2-9)    PHQ-2 Score: 0  Financial Resource Strain: High Risk (12/05/2021)   Overall Financial Resource Strain (CARDIA)    Difficulty of Paying Living Expenses: Hard  Food Insecurity: No Food Insecurity (09/30/2019)   Hunger Vital Sign    Worried About Running Out of Food in the Last Year: Never true    Ran Out of Food in the Last Year: Never true  Housing: Low Risk  (09/17/2021)   Housing    Last Housing Risk Score: 0  Physical Activity: Not on file  Social Connections: Not on file  Stress: Not on file  Tobacco Use: Low Risk  (11/21/2021)   Patient History    Smoking Tobacco Use: Never    Smokeless Tobacco Use: Never    Passive Exposure: Not on file  Transportation Needs: No Transportation Needs (12/05/2021)   PRAPARE - Transportation    Lack of Transportation (Medical): No    Lack of Transportation (Non-Medical): No    CCM Care Plan  Allergies  Allergen Reactions   Ace Inhibitors Cough   Farxiga [Dapagliflozin]     Yeast infections    Jardiance [Empagliflozin]     Yeast infections   Keflex [Cephalexin] Itching   Latex     Burn and itch   Metformin And Related Diarrhea   Nexium [Esomeprazole Magnesium] Diarrhea   Protonix [Pantoprazole Sodium] Other (See Comments)    Constipation   Doxycycline Rash    Medications Reviewed Today     Reviewed  by Lane Hacker, [facility] (Pharmacist) on 12/05/21 at [place] List Status: <None>   Medication Order Taking? Sig Documenting Provider Last Dose Status Informant  Accu-Chek Softclix Lancets lancets [id number] No  [provider] Taking Active   acetaminophen (TYLENOL) 500 MG tablet [id number] No Take 1,000 mg by mouth every 6 (six) hours as needed for moderate pain or headache. [provider] Taking Active Self  aspirin EC 81 MG tablet [id number] No Take 1 tablet (81 mg total) by mouth daily. Swallow whole. Richardo Priest, MD Taking Active   Blood Glucose Monitoring Suppl (ACCU-CHEK GUIDE ME) w/Device Drucie Opitz [id number] No Use as directed Cox, Elnita Maxwell, MD Taking Active   Calcium Carb-Cholecalciferol (CALCIUM 600 + D PO) [id number] No Take 2 tablets by mouth daily. [provider] Taking Active Self  cetirizine (ZYRTEC) 10 MG tablet [id number] No Take 10 mg by mouth daily. [provider] Taking Active Self  chlorthalidone (HYGROTON) 50 MG tablet [id number] No TAKE 1 TABLET BY MOUTH  DAILY Cox, Kirsten, MD Taking Active   diltiazem (CARDIZEM) 30 MG tablet [id number] No Take 1 tablet (30 mg total) by mouth every 6 (six) hours as needed (If your heart rate is greater than 100.). Richardo Priest, MD Taking Active   famotidine (PEPCID) 40 MG tablet [id number] No TAKE 1 TABLET BY MOUTH AT  BEDTIME Cox, Kirsten, MD Taking Active   fluticasone [facility]) 50 MCG/ACT nasal spray [id number] No Place 1 spray into the nose daily as needed for allergies. [provider] Taking Active  Med Note Caryn Section, Marylyn Ishihara A   Wed Jul 26, 2020  1:08 PM)    gabapentin (NEURONTIN) 100 MG capsule [id number] No Take 1 capsule (100 mg total) by mouth 3 (three) times daily. Cox, Kirsten, MD Taking Active   glucose blood (ACCU-CHEK GUIDE) test strip [id number] No 1 each by Other route daily in the afternoon. Cox, Kirsten, MD Taking Active   hydrocortisone (ANUSOL-HC) 25 MG suppository [id number] No  Place 1 suppository (25 mg total) rectally 2 (two) times daily. Rochel Brome, MD Taking Active   JANUVIA 100 MG tablet [id number]  TAKE 1 TABLET (100 MG TOTAL) BY MOUTH DAILY Cox, Kirsten, MD  Active   linaclotide [facility]) 145 MCG CAPS capsule [id number] No Take 1 capsule (145 mcg total) by mouth daily before breakfast. Jackquline Denmark, MD Taking Expired 11/21/21 2359   Melatonin 5 MG CAPS [id number] No Take 5 mg by mouth at bedtime. [provider] Taking Active Self  Multiple Vitamin (MULTIVITAMIN WITH MINERALS) TABS tablet [id number] No Take 1 tablet by mouth daily. [provider] Taking Active Self  omeprazole (PRILOSEC) 40 MG capsule [id number] No TAKE 1 CAPSULE BY MOUTH  TWICE DAILY BEFORE MEALS [name], PA-C Taking Active   potassium chloride SA (KLOR-CON M) 20 MEQ tablet [id number] No TAKE 2 TABLETS BY MOUTH IN  THE MORNING AND 1 TABLET IN THE Julio Alm, Kirsten, MD Taking Active   pravastatin (PRAVACHOL) 40 MG tablet [id number] No TAKE 1 TABLET BY MOUTH AT  BEDTIME Cox, Elnita Maxwell, MD Taking Active   PREMARIN vaginal cream [id number] No STMHDQQI:2.9 Applicator Vaginal Daily [provider] Taking Active   traZODone (DESYREL) 100 MG tablet [id number]  TAKE 1 TABLET BY MOUTH  BEFORE BEDTIME Cox, Kirsten, MD  Active   valsartan (DIOVAN) 320 MG tablet [id number] No TAKE 1 TABLET BY MOUTH  DAILY Cox, Kirsten, MD Taking Active             Patient Active Problem List   Diagnosis Date Noted   Memory loss 11/12/2021   External hemorrhoids 11/12/2021   S/P aortic valve replacement with bioprosthetic valve 08/10/2020   Sleep apnea    Cataract    Hypertension associated with diabetes ([place]) 10/01/2019   Mixed hyperlipidemia    Vertigo 07/20/2019   Class 2 severe obesity due to excess calories with serious comorbidity and body mass index (BMI) of 35.0 to 35.9 in adult ([place]) 06/07/2019   Gastro-esophageal reflux disease without esophagitis 06/03/2019   Type 2 diabetes  mellitus with diabetic polyneuropathy, without long-term current use of insulin ([place]) 06/03/2019   Primary insomnia 06/03/2019    Immunization History  Administered Date(s) Administered   Hepatitis B 06/22/2013, 11/16/2014, 05/26/2015   Influenza, High Dose Seasonal PF 12/29/2018   Influenza-Unspecified 01/27/2014, 01/24/2020, 01/27/2021   Moderna Covid-19 Vaccine Bivalent Booster 44yr & up 03/16/2021   Moderna SARS-COV2 Booster Vaccination 02/29/2020, 09/19/2020   Moderna Sars-Covid-2 Vaccination 06/07/2019, 07/05/2019   PNEUMOCOCCAL CONJUGATE-20 09/13/2021   Pneumococcal Conjugate-13 12/29/2013   Pneumococcal Polysaccharide-23 04/13/2012   Td 11/23/2014    Conditions to be addressed/monitored:  Hypertension, Hyperlipidemia, and Diabetes  Care Plan : CSchriever Updates made by KLane Hacker RPH since 12/05/2021 12:00 AM     Problem: dm, htn, hld, gerd   Priority: High  Onset Date: 06/28/2020     Long-Range Goal: disease management   Start Date: 06/28/2020  Expected End Date: 06/28/2021  Recent Progress: On track  Priority: High  Note:    Current  Barriers:  Patient working to increase exercise but dealing with shortness of breath and chest pain episodes.    Pharmacist Clinical Goal(s):  Over the next 90 days, patient will achieve control of chest pain episodes as evidenced by ability to exercise and avoid chest pain episodes.  through collaboration with PharmD and provider.   Interventions: 1:1 collaboration with Cox, Elnita Maxwell, MD regarding development and update of comprehensive plan of care as evidenced by provider attestation and co-signature Inter-disciplinary care team collaboration (see longitudinal plan of care) Comprehensive medication review performed; medication list updated in electronic medical record  Hypertension (BP goal <140/90) BP Readings from Last 3 Encounters:  11/21/21 114/60  11/12/21 128/62  09/17/21 130/64  -Controlled -Current  treatment: chlorthalidone 50 mg am and hs Appropriate, Effective, Safe, Accessible Diltiazem 7m prn Appropriate, Effective, Safe, Accessible Valsartan 320 mg daily Appropriate, Effective, Safe, Accessible -Medications previously tried: ace inhibitors, Metoprolol -Current home readings:   01/04/21: Readings from past few days Checks Daily AM:  148/81 (Ate a lot of salty pasta) 127/69 125/78 133/68 August 2023: Didn't have readings on her -Current dietary habits: trying to eat healthier for diabetes control.  -Current exercise habits: working to increase and start working more  -Reports hypotensive/hypertensive symptoms -Educated on BP goals and benefits of medications for prevention of heart attack, stroke and kidney damage; Daily salt intake goal < 2300 mg; Exercise goal of 150 minutes per week; Importance of home blood pressure monitoring; -Counseled to monitor BP at home daily, document, and provide log at future appointments -Counseled on diet and exercise extensively Recommended to continue current medication September 2022: Extensive time spent on counseling patient on blood pressure goal and impact of each antihypertensive medication on their blood pressure and risk reduction for CV disease.  Used analogies to explain the need for multiple antihypertensive medications to achieve BP goals and that it is often a silent disease with no symptoms.  -Recommended to continue current medication   Hyperlipidemia: (LDL goal < 70) The 10-year ASCVD risk score (Arnett DK, et al., 2019) is: 17.3%   Values used to calculate the score:     Age: [age]years     Sex: Female     Is Non-Hispanic African American: No     Diabetic: Yes     Tobacco smoker: No     Systolic Blood Pressure: 1076mmHg     Is BP treated: Yes     HDL Cholesterol: 43 mg/dL     Total Cholesterol: 130 mg/dL Lab Results  Component Value Date   CHOL 130 11/12/2021   CHOL 126 07/13/2021   CHOL 143 12/13/2020   Lab  Results  Component Value Date   HDL 43 11/12/2021   HDL 42 07/13/2021   HDL 55 12/13/2020   Lab Results  Component Value Date   LDLCALC 66 11/12/2021   LDLCALC 65 07/13/2021   LDLCALC 71 12/13/2020   Lab Results  Component Value Date   TRIG 118 11/12/2021   TRIG 99 07/13/2021   TRIG 89 12/13/2020   Lab Results  Component Value Date   CHOLHDL 3.0 11/12/2021   CHOLHDL 3.0 07/13/2021   CHOLHDL 2.6 12/13/2020  No results found for: "LDLDIRECT" -Controlled -Current treatment: Pravastatin 40 mg daily at bedtime Appropriate, Effective, Safe, Accessible -Medications previously tried: none reported  -Current dietary patterns: healthier diet and working to increase protein in diet -Current exercise habits: working to increase activity.  -Educated on Cholesterol goals;  Benefits of statin for ASCVD risk  reduction; Importance of limiting foods high in cholesterol; Exercise goal of 150 minutes per week; -Counseled on diet and exercise extensively September 2022: Patient open to trying high intensity statin  Diabetes (A1c goal <7%) Lab Results  Component Value Date   HGBA1C 7.3 (H) 11/12/2021   HGBA1C 7.0 (H) 07/13/2021   HGBA1C 6.6 (H) 03/16/2021   Lab Results  Component Value Date   MICROALBUR 10 12/13/2020   LDLCALC 66 11/12/2021   CREATININE 0.89 11/12/2021   Lab Results  Component Value Date   NA 138 11/12/2021   K 3.7 11/12/2021   CREATININE 0.89 11/12/2021   EGFR 70 11/12/2021   GFRNONAA >60 08/13/2020   GLUCOSE 164 (H) 11/12/2021   Lab Results  Component Value Date   WBC 7.2 11/12/2021   HGB 12.2 11/12/2021   HCT 35.8 11/12/2021   MCV 97 11/12/2021   PLT 205 11/12/2021  -Controlled -Current medications: accu-chek softclix daily aviva plus test strips daily  Sitagliptin (PAP, yearly in December) Appropriate, Effective, Safe, Accessible -Medications previously tried: metformin IR(Upset stomach), SGLT2(-) (Yeast infection) -Current home glucose  readings fasting glucose:  June 2023: 10/08/21 145 10/07/21 186 10/06/21 155 10/05/21 162 10/04/21 149  August 2023: 12/05/21: 155 12/04/21: 144 12/03/21: 131 12/02/21: 148 12/01/21: 150 -Denies hypoglycemic/hyperglycemic symptoms -Current meal patterns:  breakfast: working to get protein and control sugar with options - eggs   Lunch and dinner: loves spaghetti and trying to limit eating it -Current exercise: trying to exercise some -  having some shortness of breath or chest pain being evaluated by cardiologist.  -Educated onA1c and blood sugar goals; Complications of diabetes including kidney damage, retinal damage, and cardiovascular disease; Exercise goal of 150 minutes per week; Benefits of routine self-monitoring of blood sugar; Carbohydrate counting and/or plate method -Counseled to check feet daily and get yearly eye exams -Counseled on diet and exercise extensively September 2022: Will schedule f/u for December to start PAP process December 2022: Patient got PAP form in the mail and brought it into office. Coordinated with CCM team to determine if/when it was mailed out. August 2023: Sugars running high, recommend very low dose Metformin ER (Had GI issues on 10103m IR QD)  GERD  (Goal: control acid reflux symptoms ) -Controlled -Current treatment  famotidine 40 mg daily at bedtime Query Appropriate,  Omeprazole 40 mg daily Query Appropriate,  -Medications previously tried: none reported -Recommended to continue current medication August 2023: Will talk with PCP about potentially tapering off to PRN  Constipation -Controlled -Current treatment  Amitiza 253m Appropriate, Effective, Safe, Accessible -Medications previously tried:   - September 2022: Patient asked if she has to take this the rest of her life. I told her that some people do and some don't, the only way to know is to try. She has f/u with Dr. GuLyndel Safeegarding Constipation next month, she will ask doctor about doing a trial  without at some point in the future when she's ready August 2023: Will re-do PAP in December     Patient Goals/Self-Care Activities Over the next 180 days, patient will:  - take medications as prescribed focus on medication adherence by using pill box check glucose daily, document, and provide at future appointments check blood pressure daily, document, and provide at future appointments target a minimum of 150 minutes of moderate intensity exercise weekly  Follow Up Plan: Telephone follow up appointment with care management team member scheduled for: June 2024  NaArizona ConstablePhSherian Rein. - - [phone]  Medication Assistance:  Januvia: Renew December 2023 Linaclotide: Renew December 2023  Compliance/Adherence/Medication fill history: Care Gaps: Last eye exam / Retinopathy Screening? 03/21/21 Last Annual Wellness Visit? 09/13/21 Last Diabetic Foot Exam? 09/11/20     Star Rating Drugs:  Medication:                Last Fill:         Day Supply Januvia   (no med fill hx)   Patient's preferred pharmacy is:  [facility], [place] - [address] [place] [address] Phone: [phone] Fax: [phone]  [place], [place] [address] Phone: [phone] Fax: [phone]  [facility] ([place]) - [place], [place] [facility] [address] [place] [address] Phone: [phone] Fax: [phone]  [facility] ([facility]) - [place], [place] [address] Phone: [phone] Fax: [phone]  [facility] - [address], [place] [address] Phone: [phone] Fax: [phone]   Uses pill box? Yes Pt endorses 100% compliance  We discussed: Benefits of medication  synchronization, packaging and delivery as well as enhanced pharmacist oversight with [employer]. Patient decided to:  Will go over [facility] at future visit  Care Plan and Follow Up Patient Decision:  Patient agrees to Care Plan and Follow-up.  Plan: The care management team will reach out to the patient again over the next 90 days.  CPP F/U June 2024  [place] Constable, Sherian Rein.D. - [phone]

## 2021-12-12 ENCOUNTER — Ambulatory Visit: Payer: Medicare Other | Admitting: Physician Assistant

## 2021-12-20 ENCOUNTER — Other Ambulatory Visit: Payer: Self-pay

## 2021-12-20 NOTE — Patient Outreach (Signed)
  Care Coordination   12/20/2021 Name: Megan Galvan MRN: [id number] DOB: 29-Jun-1950   Care Coordination Outreach Attempts:  An unsuccessful telephone outreach was attempted today to offer the patient information about available care coordination services as a benefit of their health plan.   Follow Up Plan:  Additional outreach attempts will be made to offer the patient care coordination information and services.   Encounter Outcome:  No Answer  Care Coordination Interventions Activated:  No   Care Coordination Interventions:  No, not indicated    Tomasa Rand, RN, BSN, CEN [facility] [employer] [phone]

## 2021-12-24 ENCOUNTER — Other Ambulatory Visit: Payer: Self-pay

## 2021-12-24 NOTE — Patient Outreach (Signed)
  Care Coordination   12/24/2021 Name: Megan Galvan MRN: [id number] DOB: [birth year]   Care Coordination Outreach Attempts:  A second unsuccessful outreach was attempted today to offer the patient with information about available care coordination services as a benefit of their health plan.     Follow Up Plan:  Additional outreach attempts will be made to offer the patient care coordination information and services.   Encounter Outcome:  No Answer  Care Coordination Interventions Activated:  No   Care Coordination Interventions:  No, not indicated    Tomasa Rand, RN, BSN, CEN [facility] [employer] [phone]

## 2021-12-27 DIAGNOSIS — E785 Hyperlipidemia, unspecified: Secondary | ICD-10-CM

## 2021-12-27 DIAGNOSIS — E1169 Type 2 diabetes mellitus with other specified complication: Secondary | ICD-10-CM | POA: Diagnosis not present

## 2021-12-27 DIAGNOSIS — I10 Essential (primary) hypertension: Secondary | ICD-10-CM

## 2021-12-27 DIAGNOSIS — R051 Acute cough: Secondary | ICD-10-CM | POA: Diagnosis not present

## 2021-12-27 DIAGNOSIS — Z794 Long term (current) use of insulin: Secondary | ICD-10-CM

## 2022-01-14 ENCOUNTER — Other Ambulatory Visit: Payer: Self-pay | Admitting: Family Medicine

## 2022-01-16 ENCOUNTER — Telehealth: Payer: Self-pay

## 2022-01-16 NOTE — Progress Notes (Signed)
  Chronic Care Management Pharmacy Assistant   Name: Megan Galvan  MRN: [id number] DOB: 08/03/1950   Reason for Encounter: Disease State call for DM    Recent office visits:  None  Recent consult visits:  None  Hospital visits:  None  Medications: Outpatient Encounter Medications as of 01/16/2022  Medication Sig   Accu-Chek Softclix Lancets lancets    acetaminophen (TYLENOL) 500 MG tablet Take 1,000 mg by mouth every 6 (six) hours as needed for moderate pain or headache.   aspirin EC 81 MG tablet Take 1 tablet (81 mg total) by mouth daily. Swallow whole.   Blood Glucose Monitoring Suppl (ACCU-CHEK GUIDE ME) w/Device KIT Use as directed   Calcium Carb-Cholecalciferol (CALCIUM 600 + D PO) Take 2 tablets by mouth daily.   cetirizine (ZYRTEC) 10 MG tablet Take 10 mg by mouth daily.   chlorthalidone (HYGROTON) 50 MG tablet TAKE 1 TABLET BY MOUTH  DAILY   diltiazem (CARDIZEM) 30 MG tablet Take 1 tablet (30 mg total) by mouth every 6 (six) hours as needed (If your heart rate is greater than 100.).   famotidine (PEPCID) 40 MG tablet TAKE 1 TABLET BY MOUTH AT  BEDTIME   fluticasone (FLONASE) 50 MCG/ACT nasal spray Place 1 spray into the nose daily as needed for allergies.   gabapentin (NEURONTIN) 100 MG capsule TAKE 1 CAPSULE(100 MG) BY MOUTH THREE TIMES DAILY   glucose blood (ACCU-CHEK GUIDE) test strip 1 each by Other route daily in the afternoon.   hydrocortisone (ANUSOL-HC) 25 MG suppository Place 1 suppository (25 mg total) rectally 2 (two) times daily.   JANUVIA 100 MG tablet TAKE 1 TABLET (100 MG TOTAL) BY MOUTH DAILY   linaclotide (LINZESS) 145 MCG CAPS capsule Take 1 capsule (145 mcg total) by mouth daily before breakfast.   Melatonin 5 MG CAPS Take 5 mg by mouth at bedtime.   Multiple Vitamin (MULTIVITAMIN WITH MINERALS) TABS tablet Take 1 tablet by mouth daily.   omeprazole (PRILOSEC) 40 MG capsule TAKE 1 CAPSULE BY MOUTH  TWICE DAILY BEFORE MEALS   potassium chloride SA  (KLOR-CON M) 20 MEQ tablet TAKE 2 TABLETS BY MOUTH IN  THE MORNING AND 1 TABLET IN THE EVENING   pravastatin (PRAVACHOL) 40 MG tablet TAKE 1 TABLET BY MOUTH AT  BEDTIME   PREMARIN vaginal cream SMARTSIG:0.5 Applicator Vaginal Daily   traZODone (DESYREL) 100 MG tablet TAKE 1 TABLET BY MOUTH  BEFORE BEDTIME   valsartan (DIOVAN) 320 MG tablet TAKE 1 TABLET BY MOUTH  DAILY   No facility-administered encounter medications on file as of 01/16/2022.    Recent Relevant Labs: Lab Results  Component Value Date/Time   HGBA1C 7.3 (H) 11/12/2021 11:12 AM   HGBA1C 7.0 (H) 07/13/2021 11:25 AM   MICROALBUR 10 12/13/2020 10:44 AM   MICROALBUR 10 05/09/2020 11:32 AM    Kidney Function Lab Results  Component Value Date/Time   CREATININE 0.89 11/12/2021 11:12 AM   CREATININE 1.02 (H) 09/04/2021 10:24 AM   GFRNONAA >60 08/13/2020 05:29 AM   GFRAA 84 05/09/2020 11:32 AM     Current antihyperglycemic regimen:  Januvia 100mg daily   Patient verbally confirms she is taking the above medications as directed. Yes  What recent interventions/DTPs have been made to improve glycemic control:  No changes   Have there been any recent hospitalizations or ED visits since last visit with CPP? No  Patient denies hypoglycemic symptoms, including None  Patient denies hyperglycemic symptoms, including none  How often   are you checking your blood sugar? in the morning before eating or drinking  What are your blood sugars ranging?  Fasting: 141, 141, 158,140, 136, 145  On insulin? No  During the week, how often does your blood glucose drop below 70? Never  Are you checking your feet daily/regularly? Yes  Adherence Review: Is the patient currently on a STATIN medication? Yes Is the patient currently on ACE/ARB medication? Yes Does the patient have >5 day gap between last estimated fill dates? CPP to review  Care Gaps: Last eye exam / Retinopathy Screening? 03/21/21 Last Annual Wellness Visit?  09/13/21 Last Diabetic Foot Exam? 11/12/21   Star Rating Drugs:  Medication:  Last Fill: Day Supply Januvia   (PAP)   Megan Galvan, [place] Pharmacist Assistant  [phone]

## 2022-01-21 ENCOUNTER — Telehealth: Payer: Self-pay

## 2022-01-21 NOTE — Patient Outreach (Signed)
  Care Coordination   01/21/2022 Name: Megan Galvan MRN: [id number] DOB: Jul 29, 1950   Care Coordination Outreach Attempts:  A third unsuccessful outreach was attempted today to offer the patient with information about available care coordination services as a benefit of their health plan.   Follow Up Plan:  No further outreach attempts will be made at this time. We have been unable to contact the patient to offer or enroll patient in care coordination services  Encounter Outcome:  No Answer  Care Coordination Interventions Activated:  No   Care Coordination Interventions:  No, not indicated    Tomasa Rand, RN, BSN, CEN [place] Coordinator [phone]

## 2022-01-23 NOTE — Telephone Encounter (Signed)
Recommend work on diabetic diet and exercise versus add another medicine. We can discuss at upcoming appt. . Dr. [name]

## 2022-01-24 NOTE — Telephone Encounter (Signed)
Called patient unable to leave voicemail, due to phone just ringing.

## 2022-01-29 ENCOUNTER — Other Ambulatory Visit: Payer: Self-pay | Admitting: Family Medicine

## 2022-01-29 DIAGNOSIS — Z1231 Encounter for screening mammogram for malignant neoplasm of breast: Secondary | ICD-10-CM

## 2022-02-04 ENCOUNTER — Telehealth: Payer: Self-pay

## 2022-02-04 NOTE — Progress Notes (Signed)
2024 PAP renewal for Januvia '100mg'$  has been started and uploaded to be mailed out to pt. Pt has been informed   Elray Mcgregor, [place] Pharmacist Assistant  [phone]

## 2022-02-12 ENCOUNTER — Ambulatory Visit (INDEPENDENT_AMBULATORY_CARE_PROVIDER_SITE_OTHER): Payer: Medicare Other | Admitting: Family Medicine

## 2022-02-12 VITALS — BP 138/60 | HR 72 | Temp 96.8°F | Resp 18 | Ht 66.0 in | Wt 215.0 lb

## 2022-02-12 DIAGNOSIS — H9202 Otalgia, left ear: Secondary | ICD-10-CM | POA: Diagnosis not present

## 2022-02-12 DIAGNOSIS — E1142 Type 2 diabetes mellitus with diabetic polyneuropathy: Secondary | ICD-10-CM

## 2022-02-12 DIAGNOSIS — I152 Hypertension secondary to endocrine disorders: Secondary | ICD-10-CM | POA: Diagnosis not present

## 2022-02-12 DIAGNOSIS — E1159 Type 2 diabetes mellitus with other circulatory complications: Secondary | ICD-10-CM | POA: Diagnosis not present

## 2022-02-12 DIAGNOSIS — Z23 Encounter for immunization: Secondary | ICD-10-CM | POA: Diagnosis not present

## 2022-02-12 MED ORDER — RYBELSUS 3 MG PO TABS: 3.0000 mg | ORAL_TABLET | Freq: Every day | ORAL | 0 refills | Status: DC

## 2022-02-12 NOTE — Progress Notes (Signed)
Subjective:  Patient ID: Megan Galvan, female    DOB: [birth year]  Age: 71 y.o. MRN: [id number]  Chief Complaint  Patient presents with   Diabetes   Hypertension    HPI Megan Galvan comes in for elevated blood sugars.  She had covid in early September and her blood sugar ranges have been 159-203.  Her bp range has been 138/74-163/78.  Decreased appetite while had covid 19.    Current Outpatient Medications on File Prior to Visit  Medication Sig Dispense Refill   Accu-Chek Softclix Lancets lancets      acetaminophen (TYLENOL) 500 MG tablet Take 1,000 mg by mouth every 6 (six) hours as needed for moderate pain or headache.     aspirin EC 81 MG tablet Take 1 tablet (81 mg total) by mouth daily. Swallow whole. 90 tablet 3   Blood Glucose Monitoring Suppl (ACCU-CHEK GUIDE ME) w/Device KIT Use as directed 1 kit 0   Calcium Carb-Cholecalciferol (CALCIUM 600 + D PO) Take 2 tablets by mouth daily.     cetirizine (ZYRTEC) 10 MG tablet Take 10 mg by mouth daily.     chlorthalidone (HYGROTON) 50 MG tablet TAKE 1 TABLET BY MOUTH  DAILY 90 tablet 3   diltiazem (CARDIZEM) 30 MG tablet Take 1 tablet (30 mg total) by mouth every 6 (six) hours as needed (If your heart rate is greater than 100.). 30 tablet 3   famotidine (PEPCID) 40 MG tablet TAKE 1 TABLET BY MOUTH AT  BEDTIME 90 tablet 3   fluticasone (FLONASE) 50 MCG/ACT nasal spray Place 1 spray into the nose daily as needed for allergies.     glucose blood (ACCU-CHEK GUIDE) test strip 1 each by Other route daily in the afternoon. 100 each 3   hydrocortisone (ANUSOL-HC) 25 MG suppository Place 1 suppository (25 mg total) rectally 2 (two) times daily. 30 suppository 2   JANUVIA 100 MG tablet TAKE 1 TABLET (100 MG TOTAL) BY MOUTH DAILY 90 tablet 0   linaclotide (LINZESS) 145 MCG CAPS capsule Take 1 capsule (145 mcg total) by mouth daily before breakfast. 30 capsule 5   Melatonin 5 MG CAPS Take 5 mg by mouth at bedtime.     Multiple Vitamin (MULTIVITAMIN  WITH MINERALS) TABS tablet Take 1 tablet by mouth daily.     omeprazole (PRILOSEC) 40 MG capsule TAKE 1 CAPSULE BY MOUTH  TWICE DAILY BEFORE MEALS 180 capsule 3   potassium chloride SA (KLOR-CON M) 20 MEQ tablet TAKE 2 TABLETS BY MOUTH IN  THE MORNING AND 1 TABLET IN THE EVENING 270 tablet 3   pravastatin (PRAVACHOL) 40 MG tablet TAKE 1 TABLET BY MOUTH AT  BEDTIME 90 tablet 3   PREMARIN vaginal cream IOEVOJJK:0.9 Applicator Vaginal Daily     traZODone (DESYREL) 100 MG tablet TAKE 1 TABLET BY MOUTH  BEFORE BEDTIME 90 tablet 1   valsartan (DIOVAN) 320 MG tablet TAKE 1 TABLET BY MOUTH  DAILY 90 tablet 3   No current facility-administered medications on file prior to visit.   Past Medical History:  Diagnosis Date   Allergy    Arthritis    Cataract    Diabetes mellitus without complication (HCC)    Nonrheumatic aortic (valve) stenosis    Nonrheumatic aortic (valve) stenosis    Plantar fasciitis    S/P aortic valve replacement with bioprosthetic valve 08/10/2020   21 mm Edwards Resilia Inspiria Bioprosthetic Tissue Valve  SN [id number] Model [id number]   Sleep apnea    cpap  Past Surgical History:  Procedure Laterality Date   ANTERIOR CERVICAL DECOMP/DISCECTOMY FUSION     x 2   AORTIC VALVE REPLACEMENT N/A 08/10/2020   Procedure: AORTIC VALVE REPLACEMENT (AVR) USING INSPIRIS VALVE SIZE 21MM;  Surgeon: Rexene Alberts, MD;  Location: [facility];  Service: Open Heart Surgery;  Laterality: N/A;   BACK SURGERY  11/17/2016   L3-L5   BILATERAL CARPAL TUNNEL RELEASE     bone spur Left    left shoulder   bone spur Right    Right shoulder   CATARACT EXTRACTION Left 09/18/2021   COLONOSCOPY  08/22/2011   Moderate predominantly sigmoid diverticulosis. Small internal hemorrhoids.    COLONOSCOPY     HEMORROIDECTOMY  02/2019   NECK SURGERY     RIGHT/LEFT HEART CATH AND CORONARY ANGIOGRAPHY N/A 07/27/2020   Procedure: RIGHT/LEFT HEART CATH AND CORONARY ANGIOGRAPHY;  Surgeon: Sherren Mocha, MD;   Location: [facility];  Service: Cardiovascular;  Laterality: N/A;   TEE WITHOUT CARDIOVERSION N/A 08/10/2020   Procedure: TRANSESOPHAGEAL ECHOCARDIOGRAM (TEE);  Surgeon: Rexene Alberts, MD;  Location: [facility];  Service: Open Heart Surgery;  Laterality: N/A;   torn rotator cuff Right    Right shoulder   torn rotator cuff Left    left shoulder   TRIGGER FINGER RELEASE Left    thumb   TRIGGER FINGER RELEASE Right    thumb and middle finger   UPPER GASTROINTESTINAL ENDOSCOPY     WRIST SURGERY Left    torn ligament   WRIST SURGERY Right    cyst    Family History  Problem Relation Age of Onset   Diabetes Mother    Colon cancer Paternal Grandmother    Esophageal cancer Neg Hx    Rectal cancer Neg Hx    Stomach cancer Neg Hx    Breast cancer Neg Hx    Social History   Socioeconomic History   Marital status: Married    Spouse name: Eddie   Number of children: 2   Years of education: Not on file   Highest education level: Not on file  Occupational History   Not on file  Tobacco Use   Smoking status: Never   Smokeless tobacco: Never  Vaping Use   Vaping Use: Never used  Substance and Sexual Activity   Alcohol use: Never   Drug use: Never   Sexual activity: Not on file  Other Topics Concern   Not on file  Social History Narrative   Not on file   Social Determinants of Health   Financial Resource Strain: High Risk (12/05/2021)   Overall Financial Resource Strain (CARDIA)    Difficulty of Paying Living Expenses: Hard  Food Insecurity: No Food Insecurity (09/30/2019)   Hunger Vital Sign    Worried About Running Out of Food in the Last Year: Never true    Ran Out of Food in the Last Year: Never true  Transportation Needs: No Transportation Needs (12/05/2021)   PRAPARE - Hydrologist (Medical): No    Lack of Transportation (Non-Medical): No  Physical Activity: Not on file  Stress: Not on file  Social Connections: Not on file    Review  of Systems  Constitutional:  Positive for fatigue. Negative for chills and fever.  HENT:  Positive for ear pain (left). Negative for congestion.   Respiratory:  Positive for cough and shortness of breath. Negative for wheezing.   Cardiovascular:  Negative for chest pain and palpitations.  Neurological:  Positive for headaches.  Psychiatric/Behavioral:  Negative for dysphoric mood. The patient is not nervous/anxious.      Objective:  BP 138/60   Pulse 72   Temp (!) 96.8 F (36 C)   Resp 18   Ht _0  (1.676 m)   Wt 215 lb (97.5 kg)   BMI 34.70 kg/m      02/12/2022    3:17 PM 11/21/2021    2:10 PM 11/12/2021   10:21 AM  BP/Weight  Systolic BP 798 921 194  Diastolic BP 60 60 62  Wt. (Lbs) 215 214 215  BMI 34.7 kg/m2 35.61 kg/m2 35.78 kg/m2    Physical Exam Vitals reviewed.  Constitutional:      Appearance: Normal appearance.  HENT:     Right Ear: Tympanic membrane, ear canal and external ear normal.     Left Ear: Tympanic membrane, ear canal and external ear normal.     Nose: Nose normal.     Mouth/Throat:     Pharynx: Oropharynx is clear.  Cardiovascular:     Rate and Rhythm: Normal rate and regular rhythm.     Heart sounds: Normal heart sounds. No murmur heard. Pulmonary:     Effort: Pulmonary effort is normal. No respiratory distress.     Breath sounds: Normal breath sounds.  Lymphadenopathy:     Cervical: No cervical adenopathy.  Neurological:     Mental Status: She is alert and oriented to person, place, and time.  Psychiatric:        Mood and Affect: Mood normal.        Behavior: Behavior normal.     Diabetic Foot Exam - Simple   No data filed      Lab Results  Component Value Date   WBC 7.2 11/12/2021   HGB 12.2 11/12/2021   HCT 35.8 11/12/2021   PLT 205 11/12/2021   GLUCOSE 164 (H) 11/12/2021   CHOL 130 11/12/2021   TRIG 118 11/12/2021   HDL 43 11/12/2021   LDLCALC 66 11/12/2021   ALT 28 11/12/2021   AST 29 11/12/2021   NA 138 11/12/2021    K 3.7 11/12/2021   CL 95 (L) 11/12/2021   CREATININE 0.89 11/12/2021   BUN 13 11/12/2021   CO2 31 (H) 11/12/2021   TSH 2.360 09/04/2021   INR 1.5 (H) 08/10/2020   HGBA1C 7.3 (H) 11/12/2021   MICROALBUR 10 12/13/2020      Assessment & Plan:   Problem List Items Addressed This Visit       Cardiovascular and Mediastinum   Hypertension associated with diabetes ([place]) (Chronic)    Although patient's bp has been high at home, it is good here.  No changes recommended.      Relevant Medications   Semaglutide (RYBELSUS) 3 MG TABS     Endocrine   Type 2 diabetes mellitus with diabetic polyneuropathy, without long-term current use of insulin ([place]) - Primary    Not at goal. Add rybelsus 3 mg daily.      Relevant Medications   Semaglutide (RYBELSUS) 3 MG TABS     Nervous and Auditory   Acute otalgia, left    No evidence of infection.      Other Visit Diagnoses     Need for influenza vaccination       Relevant Orders   Flu Vaccine QUAD High Dose(Fluad) (Completed)     .  Meds ordered this encounter  Medications   Semaglutide (RYBELSUS) 3 MG TABS  Sig: Take 3 mg by mouth daily.    Dispense:  30 tablet    Refill:  0    Orders Placed This Encounter  Procedures   Flu Vaccine QUAD High Dose(Fluad)     Follow-up: Return for chronic visit. Marland Kitchen  An After Visit Summary was printed and given to the patient.  Rochel Brome, MD [facility] [phone]

## 2022-02-17 ENCOUNTER — Encounter: Payer: Self-pay | Admitting: Family Medicine

## 2022-02-17 DIAGNOSIS — H9202 Otalgia, left ear: Secondary | ICD-10-CM | POA: Insufficient documentation

## 2022-02-17 NOTE — Assessment & Plan Note (Addendum)
Not at goal. Add rybelsus 3 mg daily.

## 2022-02-17 NOTE — Assessment & Plan Note (Signed)
Although patient's bp has been high at home, it is good here.  No changes recommended.

## 2022-02-17 NOTE — Assessment & Plan Note (Signed)
No evidence of infection

## 2022-02-19 DIAGNOSIS — G4733 Obstructive sleep apnea (adult) (pediatric): Secondary | ICD-10-CM | POA: Diagnosis not present

## 2022-02-20 ENCOUNTER — Ambulatory Visit
Admission: RE | Admit: 2022-02-20 | Discharge: 2022-02-20 | Disposition: A | Discharge status: Home / Self Care | Payer: Medicare Other | Source: Ambulatory Visit | Attending: Family Medicine | Admitting: Family Medicine

## 2022-02-20 ENCOUNTER — Telehealth: Payer: Self-pay

## 2022-02-20 DIAGNOSIS — Z1231 Encounter for screening mammogram for malignant neoplasm of breast: Secondary | ICD-10-CM

## 2022-02-20 NOTE — Progress Notes (Signed)
Chronic Care Management Pharmacy Assistant   Name: TYANNAH SANE  MRN: [id number] DOB: 01-30-1951   Reason for Encounter: Disease State call for DM   Recent office visits:  02/12/22 Rochel Brome MD. Seen for routine visit. Started on Semaglutide 43m daily. D/C Gabapentin 1065m   Recent consult visits:  None  Hospital visits:  None  Medications: Outpatient Encounter Medications as of 02/20/2022  Medication Sig   Accu-Chek Softclix Lancets lancets    acetaminophen (TYLENOL) 500 MG tablet Take 1,000 mg by mouth every 6 (six) hours as needed for moderate pain or headache.   aspirin EC 81 MG tablet Take 1 tablet (81 mg total) by mouth daily. Swallow whole.   Blood Glucose Monitoring Suppl (ACCU-CHEK GUIDE ME) w/Device KIT Use as directed   Calcium Carb-Cholecalciferol (CALCIUM 600 + D PO) Take 2 tablets by mouth daily.   cetirizine (ZYRTEC) 10 MG tablet Take 10 mg by mouth daily.   chlorthalidone (HYGROTON) 50 MG tablet TAKE 1 TABLET BY MOUTH  DAILY   diltiazem (CARDIZEM) 30 MG tablet Take 1 tablet (30 mg total) by mouth every 6 (six) hours as needed (If your heart rate is greater than 100.).   famotidine (PEPCID) 40 MG tablet TAKE 1 TABLET BY MOUTH AT  BEDTIME   fluticasone (FLONASE) 50 MCG/ACT nasal spray Place 1 spray into the nose daily as needed for allergies.   glucose blood (ACCU-CHEK GUIDE) test strip 1 each by Other route daily in the afternoon.   hydrocortisone (ANUSOL-HC) 25 MG suppository Place 1 suppository (25 mg total) rectally 2 (two) times daily.   JANUVIA 100 MG tablet TAKE 1 TABLET (100 MG TOTAL) BY MOUTH DAILY   linaclotide (LINZESS) 145 MCG CAPS capsule Take 1 capsule (145 mcg total) by mouth daily before breakfast.   Melatonin 5 MG CAPS Take 5 mg by mouth at bedtime.   Multiple Vitamin (MULTIVITAMIN WITH MINERALS) TABS tablet Take 1 tablet by mouth daily.   omeprazole (PRILOSEC) 40 MG capsule TAKE 1 CAPSULE BY MOUTH  TWICE DAILY BEFORE MEALS   potassium  chloride SA (KLOR-CON M) 20 MEQ tablet TAKE 2 TABLETS BY MOUTH IN  THE MORNING AND 1 TABLET IN THE EVENING   pravastatin (PRAVACHOL) 40 MG tablet TAKE 1 TABLET BY MOUTH AT  BEDTIME   PREMARIN vaginal cream SMZTIWPYKD:9.8pplicator Vaginal Daily   Semaglutide (RYBELSUS) 3 MG TABS Take 3 mg by mouth daily.   traZODone (DESYREL) 100 MG tablet TAKE 1 TABLET BY MOUTH  BEFORE BEDTIME   valsartan (DIOVAN) 320 MG tablet TAKE 1 TABLET BY MOUTH  DAILY   No facility-administered encounter medications on file as of 02/20/2022.    Recent Relevant Labs: Lab Results  Component Value Date/Time   HGBA1C 7.3 (H) 11/12/2021 11:12 AM   HGBA1C 7.0 (H) 07/13/2021 11:25 AM   MICROALBUR 10 12/13/2020 10:44 AM   MICROALBUR 10 05/09/2020 11:32 AM    Kidney Function Lab Results  Component Value Date/Time   CREATININE 0.89 11/12/2021 11:12 AM   CREATININE 1.02 (H) 09/04/2021 10:24 AM   GFRNONAA >60 08/13/2020 05:29 AM   GFRAA 84 05/09/2020 11:32 AM     Current antihyperglycemic regimen:  Januvia 10037maily  Semaglutide 3mg59mily  Patient verbally confirms she is taking the above medications as directed. Yes  What recent interventions/DTPs have been made to improve glycemic control:  Pt started on Rybelsus and sugars have come down since what they were  Have there been any recent hospitalizations or  ED visits since last visit with CPP? No  Patient denies hypoglycemic symptoms, including None  Patient denies hyperglycemic symptoms including none  How often are you checking your blood sugar? once daily  What are your blood sugars ranging?  1 hour after meals 02/19/22 146, 02/18/22 150, 02/17/22 154, 02/16/22 159  On insulin? No  During the week, how often does your blood glucose drop below 70? Never  Are you checking your feet daily/regularly? Yes  Adherence Review: Is the patient currently on a STATIN medication? Yes Is the patient currently on ACE/ARB medication? Yes Does the patient have  >5 day gap between last estimated fill dates? CPP to review  Care Gaps: Last eye exam / Retinopathy Screening? 03/21/21 Last Annual Wellness Visit? 09/13/21 Last Diabetic Foot Exam? 11/12/21   Star Rating Drugs:  Medication:  Last Fill: Day Supply Januvia                        (PAP) Semaglutide   Started on 02/14/22 (Samples Box)   Elray Mcgregor, [place] Pharmacist Assistant  [phone]

## 2022-02-23 ENCOUNTER — Other Ambulatory Visit: Payer: Self-pay | Admitting: Family Medicine

## 2022-02-23 DIAGNOSIS — I10 Essential (primary) hypertension: Secondary | ICD-10-CM

## 2022-03-04 ENCOUNTER — Other Ambulatory Visit: Payer: Self-pay | Admitting: Family Medicine

## 2022-03-15 ENCOUNTER — Other Ambulatory Visit: Payer: Self-pay | Admitting: Family Medicine

## 2022-03-15 ENCOUNTER — Telehealth: Payer: Self-pay

## 2022-03-15 NOTE — Chronic Care Management (AMB) (Signed)
Task sent to clinical team for a short supply of Januvia to local pharmacy, patient stated she spoke with [employer] for her patient assistance medication and she needs a refill prescription and they are sending information to Pcp team to fill. Patient aware I will request refill sent to [facility] for her to purchase if she runs out before refill from patient assistance has been received.   Megan Galvan, [place] Pharmacist Assistant [phone]

## 2022-03-19 ENCOUNTER — Ambulatory Visit: Payer: Medicare Other | Admitting: Family Medicine

## 2022-03-19 ENCOUNTER — Other Ambulatory Visit: Payer: Self-pay

## 2022-03-19 MED ORDER — RYBELSUS 7 MG PO TABS: 7.0000 mg | ORAL_TABLET | Freq: Every day | ORAL | 0 refills | Status: DC

## 2022-03-19 NOTE — Telephone Encounter (Signed)
Megan Galvan is willing to increase the rybelsus to 7 mg.  She is currently out of the [place].  PRESCRIPTION sent to the pharmacy.

## 2022-03-24 ENCOUNTER — Other Ambulatory Visit: Payer: Self-pay

## 2022-03-25 ENCOUNTER — Telehealth: Payer: Self-pay

## 2022-03-25 NOTE — Progress Notes (Signed)
Chronic Care Management Pharmacy Assistant   Name: Megan Galvan  MRN: [id number] DOB: February 20, 1951   Reason for Encounter: Disease State call for DM    Recent office visits:  None  Recent consult visits:  None  Hospital visits:  None  Medications: Outpatient Encounter Medications as of 03/25/2022  Medication Sig   Accu-Chek Softclix Lancets lancets    acetaminophen (TYLENOL) 500 MG tablet Take 1,000 mg by mouth every 6 (six) hours as needed for moderate pain or headache.   aspirin EC 81 MG tablet Take 1 tablet (81 mg total) by mouth daily. Swallow whole.   Blood Glucose Monitoring Suppl (ACCU-CHEK GUIDE ME) w/Device KIT Use as directed   Calcium Carb-Cholecalciferol (CALCIUM 600 + D PO) Take 2 tablets by mouth daily.   cetirizine (ZYRTEC) 10 MG tablet Take 10 mg by mouth daily.   chlorthalidone (HYGROTON) 50 MG tablet TAKE 1 TABLET BY MOUTH DAILY   diltiazem (CARDIZEM) 30 MG tablet Take 1 tablet (30 mg total) by mouth every 6 (six) hours as needed (If your heart rate is greater than 100.).   famotidine (PEPCID) 40 MG tablet TAKE 1 TABLET BY MOUTH AT  BEDTIME   fluticasone (FLONASE) 50 MCG/ACT nasal spray Place 1 spray into the nose daily as needed for allergies.   glucose blood (ACCU-CHEK GUIDE) test strip 1 each by Other route daily in the afternoon.   hydrocortisone (ANUSOL-HC) 25 MG suppository Place 1 suppository (25 mg total) rectally 2 (two) times daily.   linaclotide (LINZESS) 145 MCG CAPS capsule Take 1 capsule (145 mcg total) by mouth daily before breakfast.   Melatonin 5 MG CAPS Take 5 mg by mouth at bedtime.   Multiple Vitamin (MULTIVITAMIN WITH MINERALS) TABS tablet Take 1 tablet by mouth daily.   omeprazole (PRILOSEC) 40 MG capsule TAKE 1 CAPSULE BY MOUTH  TWICE DAILY BEFORE MEALS   potassium chloride SA (KLOR-CON M) 20 MEQ tablet TAKE 2 TABLETS BY MOUTH IN  THE MORNING AND 1 TABLET IN THE EVENING   pravastatin (PRAVACHOL) 40 MG tablet TAKE 1 TABLET BY MOUTH AT   BEDTIME   PREMARIN vaginal cream CNOBSJGG:8.3 Applicator Vaginal Daily   Semaglutide (RYBELSUS) 7 MG TABS Take 7 mg by mouth daily.   traZODone (DESYREL) 100 MG tablet TAKE 1 TABLET BY MOUTH  BEFORE BEDTIME   valsartan (DIOVAN) 320 MG tablet TAKE 1 TABLET BY MOUTH DAILY   No facility-administered encounter medications on file as of 03/25/2022.    Recent Relevant Labs: Lab Results  Component Value Date/Time   HGBA1C 7.3 (H) 11/12/2021 11:12 AM   HGBA1C 7.0 (H) 07/13/2021 11:25 AM   MICROALBUR 10 12/13/2020 10:44 AM   MICROALBUR 10 05/09/2020 11:32 AM    Kidney Function Lab Results  Component Value Date/Time   CREATININE 0.89 11/12/2021 11:12 AM   CREATININE 1.02 (H) 09/04/2021 10:24 AM   GFRNONAA >60 08/13/2020 05:29 AM   GFRAA 84 05/09/2020 11:32 AM     Current antihyperglycemic regimen:  Januvia 166m daily (Dr. CTobie PoetD/C this) Semaglutide 752mdaily  Patient verbally confirms she is taking the above medications as directed. Yes  Do you receive your medications through PAP? Yes  If Yes, what is the status? Januvia was D/C per patient  What recent interventions/DTPs have been made to improve glycemic control:  Pt is only taking Semaglutide 102m25mow and will be following up with Dr. CoxTobie Poet the next week and wants to do PAP if this is something she  will stay on and will let us know after her follow up.   Have there been any recent hospitalizations or ED visits since last visit with CPP? No  Patient denies hypoglycemic symptoms, including None  Patient denies hyperglycemic symptoms, including none  How often are you checking your blood sugar? once daily  What are your blood sugars ranging?  After Meals 03/20/22 177, 03/21/22 155, 03/22/22 152, 03/23/22 143, 03/24/22 134   I advised pt to start taking it fasting instead of after a meal and pt will start taking it in the mornings before eating or drinking.   On insulin? No  During the week, how often does your blood glucose  drop below 70? Never  Are you checking your feet daily/regularly? Yes  Adherence Review: Is the patient currently on a STATIN medication? Yes Is the patient currently on ACE/ARB medication? Yes Does the patient have >5 day gap between last estimated fill dates?   Care Gaps: Last eye exam / Retinopathy Screening? 03/21/21 Last Annual Wellness Visit? 09/13/21 Last Diabetic Foot Exam? 11/12/21   Star Rating Drugs:  Medication:  Last Fill: Day Supply Semaglutide               03/19/22 30ds - Started on 02/14/22 (Samples Box)   Elray Mcgregor, [place] Pharmacist Assistant  [phone]

## 2022-03-27 ENCOUNTER — Telehealth: Payer: Self-pay

## 2022-03-27 NOTE — Chronic Care Management (AMB) (Signed)
Mailed 2024 re-enrollment application to [employer] patient assistance program for Januvia.   Pattricia Boss, [place] Pharmacist Assistant [phone]

## 2022-03-28 ENCOUNTER — Ambulatory Visit (INDEPENDENT_AMBULATORY_CARE_PROVIDER_SITE_OTHER): Payer: Medicare Other | Admitting: Family Medicine

## 2022-03-28 ENCOUNTER — Encounter: Payer: Self-pay | Admitting: Family Medicine

## 2022-03-28 VITALS — BP 140/78 | HR 74 | Temp 96.7°F | Resp 18 | Ht 66.0 in | Wt 212.0 lb

## 2022-03-28 DIAGNOSIS — E1159 Type 2 diabetes mellitus with other circulatory complications: Secondary | ICD-10-CM

## 2022-03-28 DIAGNOSIS — E782 Mixed hyperlipidemia: Secondary | ICD-10-CM

## 2022-03-28 DIAGNOSIS — E66811 Obesity, class 1: Secondary | ICD-10-CM | POA: Insufficient documentation

## 2022-03-28 DIAGNOSIS — Z6834 Body mass index (BMI) 34.0-34.9, adult: Secondary | ICD-10-CM

## 2022-03-28 DIAGNOSIS — E1142 Type 2 diabetes mellitus with diabetic polyneuropathy: Secondary | ICD-10-CM | POA: Diagnosis not present

## 2022-03-28 DIAGNOSIS — I152 Hypertension secondary to endocrine disorders: Secondary | ICD-10-CM | POA: Diagnosis not present

## 2022-03-28 DIAGNOSIS — K219 Gastro-esophageal reflux disease without esophagitis: Secondary | ICD-10-CM | POA: Diagnosis not present

## 2022-03-28 DIAGNOSIS — E6609 Other obesity due to excess calories: Secondary | ICD-10-CM

## 2022-03-28 HISTORY — DX: Other obesity due to excess calories: E66.09

## 2022-03-28 MED ORDER — RYBELSUS 14 MG PO TABS: 14.0000 mg | ORAL_TABLET | Freq: Every day | ORAL | 0 refills | Status: DC

## 2022-03-28 NOTE — Patient Instructions (Addendum)
Increase rybelsus to 14 mg once daily. Let us know if you have worsening irritability.

## 2022-03-28 NOTE — Progress Notes (Signed)
Subjective:  Patient ID: Megan Galvan, female    DOB: May 27, 1950  Age: 71 y.o. MRN: [id number]  Chief Complaint  Patient presents with   Diabetes   Hypertension    HPI Diabetes: She presents for her follow-up diabetic visit. She has type 2 diabetes mellitus.  Complications: diabetic polyneuropathy. Glucose checking:  daily  Glucose logs: Blood sugar range 119-217. Hypoglycemia:none Most recent A1C: 7.3 Current medications: Rybelsus 7 mg daily  Last Eye Exam: overdue Foot checks: daily.  Hyperlipidemia: Current medications:  Pravastatin 40 mg daily   Hypertension: Current medications:  Chlorthalidone 50 mg daily, Valsartan 320 mg daily.   BP usually  130s/60-70s.  GERD: well controlled on famotidine and omeprazole twice daily.   Diet: trying. Eating more fruit. Avoiding carbs/sweets.  Exercise: none  Hemorrhoids continue to flare up at times. Patient is taking miralax, [place]. Using anusol hc as needed. .    Current Outpatient Medications on File Prior to Visit  Medication Sig Dispense Refill   Accu-Chek Softclix Lancets lancets      acetaminophen (TYLENOL) 500 MG tablet Take 1,000 mg by mouth every 6 (six) hours as needed for moderate pain or headache.     aspirin EC 81 MG tablet Take 1 tablet (81 mg total) by mouth daily. Swallow whole. 90 tablet 3   Blood Glucose Monitoring Suppl (ACCU-CHEK GUIDE ME) w/Device KIT Use as directed 1 kit 0   chlorthalidone (HYGROTON) 50 MG tablet TAKE 1 TABLET BY MOUTH DAILY 100 tablet 2   diltiazem (CARDIZEM) 30 MG tablet Take 1 tablet (30 mg total) by mouth every 6 (six) hours as needed (If your heart rate is greater than 100.). 30 tablet 3   famotidine (PEPCID) 40 MG tablet TAKE 1 TABLET BY MOUTH AT  BEDTIME 100 tablet 1   fluticasone (FLONASE) 50 MCG/ACT nasal spray Place 1 spray into the nose daily as needed for allergies.     glucose blood (ACCU-CHEK GUIDE) test strip 1 each by Other route daily in the afternoon. 100 each 3    hydrocortisone (ANUSOL-HC) 25 MG suppository Place 1 suppository (25 mg total) rectally 2 (two) times daily. 30 suppository 2   linaclotide (LINZESS) 145 MCG CAPS capsule Take 1 capsule (145 mcg total) by mouth daily before breakfast. 30 capsule 5   Melatonin 5 MG CAPS Take 5 mg by mouth at bedtime.     Multiple Vitamin (MULTIVITAMIN WITH MINERALS) TABS tablet Take 1 tablet by mouth daily.     omeprazole (PRILOSEC) 40 MG capsule TAKE 1 CAPSULE BY MOUTH  TWICE DAILY BEFORE MEALS 180 capsule 3   potassium chloride SA (KLOR-CON M) 20 MEQ tablet TAKE 2 TABLETS BY MOUTH IN  THE MORNING AND 1 TABLET IN THE EVENING 270 tablet 3   pravastatin (PRAVACHOL) 40 MG tablet TAKE 1 TABLET BY MOUTH AT  BEDTIME 90 tablet 3   PREMARIN vaginal cream PPIRJJOA:4.1 Applicator Vaginal Daily     traZODone (DESYREL) 100 MG tablet TAKE 1 TABLET BY MOUTH  BEFORE BEDTIME 90 tablet 1   valsartan (DIOVAN) 320 MG tablet TAKE 1 TABLET BY MOUTH DAILY 100 tablet 2   Calcium Carb-Cholecalciferol (CALCIUM 600 + D PO) Take 2 tablets by mouth daily.     cetirizine (ZYRTEC) 10 MG tablet Take 10 mg by mouth daily.     No current facility-administered medications on file prior to visit.   Past Medical History:  Diagnosis Date   Allergy    Arthritis    Cataract  Diabetes mellitus without complication ([place])    Nonrheumatic aortic (valve) stenosis    Nonrheumatic aortic (valve) stenosis    Plantar fasciitis    S/P aortic valve replacement with bioprosthetic valve 08/10/2020   21 mm Edwards Resilia Inspiria Bioprosthetic Tissue Valve  SN [id number] Model [id number]   Sleep apnea    cpap   Past Surgical History:  Procedure Laterality Date   ANTERIOR CERVICAL DECOMP/DISCECTOMY FUSION     x 2   AORTIC VALVE REPLACEMENT N/A 08/10/2020   Procedure: AORTIC VALVE REPLACEMENT (AVR) USING INSPIRIS VALVE SIZE 21MM;  Surgeon: Rexene Alberts, MD;  Location: [place];  Service: Open Heart Surgery;  Laterality: N/A;   BACK SURGERY  11/17/2016    L3-L5   BILATERAL CARPAL TUNNEL RELEASE     bone spur Left    left shoulder   bone spur Right    Right shoulder   CATARACT EXTRACTION Left 09/18/2021   COLONOSCOPY  08/22/2011   Moderate predominantly sigmoid diverticulosis. Small internal hemorrhoids.    COLONOSCOPY     HEMORROIDECTOMY  02/2019   NECK SURGERY     RIGHT/LEFT HEART CATH AND CORONARY ANGIOGRAPHY N/A 07/27/2020   Procedure: RIGHT/LEFT HEART CATH AND CORONARY ANGIOGRAPHY;  Surgeon: Sherren Mocha, MD;  Location: [facility];  Service: Cardiovascular;  Laterality: N/A;   TEE WITHOUT CARDIOVERSION N/A 08/10/2020   Procedure: TRANSESOPHAGEAL ECHOCARDIOGRAM (TEE);  Surgeon: Rexene Alberts, MD;  Location: [place];  Service: Open Heart Surgery;  Laterality: N/A;   torn rotator cuff Right    Right shoulder   torn rotator cuff Left    left shoulder   TRIGGER FINGER RELEASE Left    thumb   TRIGGER FINGER RELEASE Right    thumb and middle finger   UPPER GASTROINTESTINAL ENDOSCOPY     WRIST SURGERY Left    torn ligament   WRIST SURGERY Right    cyst    Family History  Problem Relation Age of Onset   Diabetes Mother    Colon cancer Paternal Grandmother    Esophageal cancer Neg Hx    Rectal cancer Neg Hx    Stomach cancer Neg Hx    Breast cancer Neg Hx    Social History   Socioeconomic History   Marital status: Married    Spouse name: Eddie   Number of children: 2   Years of education: Not on file   Highest education level: Not on file  Occupational History   Not on file  Tobacco Use   Smoking status: Never   Smokeless tobacco: Never  Vaping Use   Vaping Use: Never used  Substance and Sexual Activity   Alcohol use: Never   Drug use: Never   Sexual activity: Not on file  Other Topics Concern   Not on file  Social History Narrative   Not on file   Social Determinants of Health   Financial Resource Strain: High Risk (12/05/2021)   Overall Financial Resource Strain (CARDIA)    Difficulty of Paying  Living Expenses: Hard  Food Insecurity: No Food Insecurity (09/30/2019)   Hunger Vital Sign    Worried About Running Out of Food in the Last Year: Never true    Ran Out of Food in the Last Year: Never true  Transportation Needs: No Transportation Needs (12/05/2021)   PRAPARE - Hydrologist (Medical): No    Lack of Transportation (Non-Medical): No  Physical Activity: Not on file  Stress: Not on  file  Social Connections: Not on file    Review of Systems  Constitutional:  Negative for chills, fatigue and fever.  HENT:  Negative for congestion, rhinorrhea and sore throat.   Respiratory:  Negative for cough and shortness of breath.   Cardiovascular:  Negative for chest pain.  Gastrointestinal:  Negative for abdominal pain, constipation, diarrhea, nausea and vomiting.  Genitourinary:  Negative for dysuria and urgency.  Musculoskeletal:  Negative for back pain and myalgias.  Neurological:  Negative for dizziness, weakness, light-headedness and headaches.  Psychiatric/Behavioral:  Negative for dysphoric mood. The patient is not nervous/anxious.      Objective:  BP (!) 140/78   Pulse 74   Temp (!) 96.7 F (35.9 C)   Resp 18   Ht _0  (1.676 m)   Wt 212 lb (96.2 kg)   BMI 34.22 kg/m      03/28/2022   11:47 AM 03/28/2022   10:54 AM 02/12/2022    3:17 PM  BP/Weight  Systolic BP 094 709 628  Diastolic BP 78 64 60  Wt. (Lbs)  212 215  BMI  34.22 kg/m2 34.7 kg/m2    Physical Exam Vitals reviewed.  Constitutional:      Appearance: Normal appearance. She is normal weight.  Neck:     Vascular: No carotid bruit.  Cardiovascular:     Rate and Rhythm: Normal rate and regular rhythm.     Heart sounds: Normal heart sounds.  Pulmonary:     Effort: Pulmonary effort is normal. No respiratory distress.     Breath sounds: Normal breath sounds.  Abdominal:     General: Abdomen is flat. Bowel sounds are normal.     Palpations: Abdomen is soft.      Tenderness: There is no abdominal tenderness.  Neurological:     Mental Status: She is alert and oriented to person, place, and time.  Psychiatric:        Mood and Affect: Mood normal.        Behavior: Behavior normal.     Diabetic Foot Exam - Simple   Simple Foot Form Diabetic Foot exam was performed with the following findings: Yes 03/28/2022 11:45 AM  Visual Inspection No deformities, no ulcerations, no other skin breakdown bilaterally: Yes Sensation Testing Intact to touch and monofilament testing bilaterally: Yes Pulse Check Posterior Tibialis and Dorsalis pulse intact bilaterally: Yes Comments      Lab Results  Component Value Date   WBC 6.6 03/28/2022   HGB 13.3 03/28/2022   HCT 38.9 03/28/2022   PLT 241 03/28/2022   GLUCOSE 142 (H) 03/28/2022   CHOL 124 03/28/2022   TRIG 103 03/28/2022   HDL 42 03/28/2022   LDLCALC 63 03/28/2022   ALT 32 03/28/2022   AST 37 03/28/2022   NA 142 03/28/2022   K 3.4 (L) 03/28/2022   CL 95 (L) 03/28/2022   CREATININE 0.84 03/28/2022   BUN 13 03/28/2022   CO2 29 03/28/2022   TSH 2.360 09/04/2021   INR 1.5 (H) 08/10/2020   HGBA1C 7.8 (H) 03/28/2022   MICROALBUR 10 12/13/2020      Assessment & Plan:   Problem List Items Addressed This Visit       Cardiovascular and Mediastinum   Hypertension associated with diabetes ([place]) - Primary (Chronic)    Well controlled.  No changes to medicines. Chlorthalidone 50 mg daily, Valsartan 320 mg daily  Continue to work on eating a healthy diet and exercise.  Labs drawn today.  Relevant Medications   Semaglutide (RYBELSUS) 14 MG TABS   Other Relevant Orders   CBC with Differential/Platelet (Completed)   Comprehensive metabolic panel (Completed)     Digestive   Gastro-esophageal reflux disease without esophagitis    The current medical regimen is effective;  continue present plan and medications.  well controlled on famotidine and omeprazole twice daily.           Endocrine   Type 2 diabetes mellitus with diabetic polyneuropathy, without long-term current use of insulin (HCC)    Control: uncontrolled Recommend check sugars fasting daily. Recommend check feet daily. Recommend annual eye exams. Medicines: Increase rybelsus to 14 mg once daily. Let us know if you have worsening irritability.  Continue to work on eating a healthy diet and exercise.  Labs drawn today.         Relevant Medications   Semaglutide (RYBELSUS) 14 MG TABS   Other Relevant Orders   Hemoglobin A1c (Completed)     Other   Mixed hyperlipidemia    Well controlled.  No changes to medicines. Pravastatin 40 mg daily  Continue to work on eating a healthy diet and exercise.  Labs drawn today.        Relevant Orders   Lipid panel (Completed)   Class 1 obesity due to excess calories with serious comorbidity and body mass index (BMI) of 34.0 to 34.9 in adult    Recommend continue to work on eating healthy diet and exercise.       Relevant Medications   Semaglutide (RYBELSUS) 14 MG TABS  .  Meds ordered this encounter  Medications   Semaglutide (RYBELSUS) 14 MG TABS    Sig: Take 1 tablet (14 mg total) by mouth daily.    Dispense:  90 tablet    Refill:  0    Orders Placed This Encounter  Procedures   CBC with Differential/Platelet   Comprehensive metabolic panel   Hemoglobin A1c   Lipid panel   Cardiovascular Risk Assessment     Follow-up: Return in about 3 months (around 06/27/2022) for chronic fasting.  An After Visit Summary was printed and given to the patient.  Rochel Brome, MD [facility] [phone]

## 2022-03-29 ENCOUNTER — Telehealth: Payer: Self-pay

## 2022-03-29 LAB — LIPID PANEL
Chol/HDL Ratio: 3 ratio (ref 0.0–4.4)
Cholesterol, Total: 124 mg/dL (ref 100–199)
HDL: 42 mg/dL (ref >39)
LDL Chol Calc (NIH): 63 mg/dL (ref 0–99)
Triglycerides: 103 mg/dL (ref 0–149)
VLDL Cholesterol Cal: 19 mg/dL (ref 5–40)

## 2022-03-29 LAB — CBC WITH DIFFERENTIAL/PLATELET
Basophils Absolute: 0.1 10*3/uL (ref 0.0–0.2)
Basos: 1 %
EOS (ABSOLUTE): 0.1 10*3/uL (ref 0.0–0.4)
Eos: 2 %
Hematocrit: 38.9 % (ref 34.0–46.6)
Hemoglobin: 13.3 g/dL (ref 11.1–15.9)
Immature Grans (Abs): 0 10*3/uL (ref 0.0–0.1)
Immature Granulocytes: 0 %
Lymphocytes Absolute: 2.6 10*3/uL (ref 0.7–3.1)
Lymphs: 39 %
MCH: 33.2 pg — ABNORMAL HIGH (ref 26.6–33.0)
MCHC: 34.2 g/dL (ref 31.5–35.7)
MCV: 97 fL (ref 79–97)
Monocytes Absolute: 0.7 10*3/uL (ref 0.1–0.9)
Monocytes: 11 %
Neutrophils Absolute: 3.2 10*3/uL (ref 1.4–7.0)
Neutrophils: 47 %
Platelets: 241 10*3/uL (ref 150–450)
RBC: 4.01 x10E6/uL (ref 3.77–5.28)
RDW: 12.8 % (ref 11.7–15.4)
WBC: 6.6 10*3/uL (ref 3.4–10.8)

## 2022-03-29 LAB — COMPREHENSIVE METABOLIC PANEL
ALT: 32 IU/L (ref 0–32)
AST: 37 IU/L (ref 0–40)
Albumin/Globulin Ratio: 1.6 (ref 1.2–2.2)
Albumin: 4.6 g/dL (ref 3.9–4.9)
Alkaline Phosphatase: 59 IU/L (ref 44–121)
BUN/Creatinine Ratio: 15 (ref 12–28)
BUN: 13 mg/dL (ref 8–27)
Bilirubin Total: 0.5 mg/dL (ref 0.0–1.2)
CO2: 29 mmol/L (ref 20–29)
Calcium: 9.7 mg/dL (ref 8.7–10.3)
Chloride: 95 mmol/L — ABNORMAL LOW (ref 96–106)
Creatinine, Ser: 0.84 mg/dL (ref 0.57–1.00)
Globulin, Total: 2.8 g/dL (ref 1.5–4.5)
Glucose: 142 mg/dL — ABNORMAL HIGH (ref 70–99)
Potassium: 3.4 mmol/L — ABNORMAL LOW (ref 3.5–5.2)
Sodium: 142 mmol/L (ref 134–144)
Total Protein: 7.4 g/dL (ref 6.0–8.5)
eGFR: 75 mL/min/{1.73_m2} (ref >59)

## 2022-03-29 LAB — HEMOGLOBIN A1C
Est. average glucose Bld gHb Est-mCnc: 177 mg/dL
Hgb A1c MFr Bld: 7.8 % — ABNORMAL HIGH (ref 4.8–5.6)

## 2022-03-29 LAB — CARDIOVASCULAR RISK ASSESSMENT

## 2022-03-29 NOTE — Telephone Encounter (Signed)
Patient called stated that her insurance said the Rybelsus 14 mg would cost her 740 dollars, she wanted to know if she get help with patient assistant.  Patient is currently working with [facility] and I told her to give them a call so they help her with patient assistant.  Patient Made Aware, Verbalized Understanding.

## 2022-03-30 NOTE — Progress Notes (Signed)
Blood count normal.  Liver function normal.  Kidney function normal.  Potassium 3.4. recommend increase potassium chloride to 2 tablets twice daily.  Cholesterol: good HBA1C: 7.8 worsened. I increased rybelsus 14 mg daily.

## 2022-03-31 ENCOUNTER — Encounter: Payer: Self-pay | Admitting: Family Medicine

## 2022-03-31 NOTE — Assessment & Plan Note (Signed)
Well controlled.  No changes to medicines. Pravastatin 40 mg daily Continue to work on eating a healthy diet and exercise.  Labs drawn today.   

## 2022-03-31 NOTE — Assessment & Plan Note (Signed)
Recommend continue to work on eating healthy diet and exercise.  

## 2022-03-31 NOTE — Assessment & Plan Note (Signed)
Well controlled.  No changes to medicines. Chlorthalidone 50 mg daily, Valsartan 320 mg daily  Continue to work on eating a healthy diet and exercise.  Labs drawn today.

## 2022-03-31 NOTE — Assessment & Plan Note (Signed)
The current medical regimen is effective;  continue present plan and medications.  well controlled on famotidine and omeprazole twice daily.

## 2022-03-31 NOTE — Assessment & Plan Note (Signed)
Control: uncontrolled Recommend check sugars fasting daily. Recommend check feet daily. Recommend annual eye exams. Medicines: Increase rybelsus to 14 mg once daily. Let us know if you have worsening irritability.  Continue to work on eating a healthy diet and exercise.  Labs drawn today.

## 2022-04-01 ENCOUNTER — Telehealth: Payer: Self-pay

## 2022-04-01 NOTE — Progress Notes (Signed)
Rybelsus '14mg'$  PAP has been started and will be mailed to pt to finish completing and understands to take it to Dr. [name] office to fax in .   Elray Mcgregor, [place] Pharmacist Assistant  [phone]

## 2022-04-02 ENCOUNTER — Other Ambulatory Visit: Payer: Self-pay

## 2022-04-02 ENCOUNTER — Telehealth: Payer: Self-pay

## 2022-04-02 DIAGNOSIS — B372 Candidiasis of skin and nail: Secondary | ICD-10-CM | POA: Diagnosis not present

## 2022-04-02 MED ORDER — POTASSIUM CHLORIDE CRYS ER 20 MEQ PO TBCR: 40.0000 meq | EXTENDED_RELEASE_TABLET | Freq: Two times a day (BID) | ORAL | 2 refills | Status: DC

## 2022-04-02 NOTE — Telephone Encounter (Signed)
Patient called and wanted to let you know she talk to her GYN and they told her it is not the medication cause her yeast infection it's not yeast, she is able to keep taking medication and she is not able to move to 14 mg because it will cost her 1000 dollars a month, she is working with Andee Poles not to get Patient asst, so is it okay if he can stay on 7 mg until than?

## 2022-04-04 NOTE — Telephone Encounter (Signed)
Patient was informed.

## 2022-04-15 ENCOUNTER — Telehealth: Payer: Self-pay

## 2022-04-15 NOTE — Chronic Care Management (AMB) (Signed)
Faxed 2024 re-enrollment application to [employer] patient assistance for Rybelsus.     Pattricia Boss, [place] Pharmacist Assistant [phone]

## 2022-04-17 ENCOUNTER — Other Ambulatory Visit: Payer: Self-pay

## 2022-04-17 DIAGNOSIS — E1142 Type 2 diabetes mellitus with diabetic polyneuropathy: Secondary | ICD-10-CM

## 2022-04-17 MED ORDER — RYBELSUS 14 MG PO TABS: 14.0000 mg | ORAL_TABLET | Freq: Every day | ORAL | 0 refills | Status: DC

## 2022-04-18 ENCOUNTER — Other Ambulatory Visit: Payer: Self-pay

## 2022-04-18 DIAGNOSIS — B3731 Acute candidiasis of vulva and vagina: Secondary | ICD-10-CM | POA: Diagnosis not present

## 2022-04-18 DIAGNOSIS — R300 Dysuria: Secondary | ICD-10-CM | POA: Diagnosis not present

## 2022-04-25 ENCOUNTER — Other Ambulatory Visit: Payer: Self-pay

## 2022-04-25 MED ORDER — ACCU-CHEK SOFTCLIX LANCETS MISC: 1.0000 | Freq: Every day | 0 refills | Status: DC

## 2022-04-27 ENCOUNTER — Other Ambulatory Visit: Payer: Self-pay | Admitting: Family Medicine

## 2022-05-02 ENCOUNTER — Other Ambulatory Visit: Payer: Self-pay

## 2022-05-02 MED ORDER — ACCU-CHEK SOFTCLIX LANCETS MISC: 1.0000 | Freq: Every day | 0 refills | Status: DC

## 2022-05-03 ENCOUNTER — Telehealth: Payer: Self-pay | Admitting: Cardiology

## 2022-05-03 MED ORDER — DILTIAZEM HCL 30 MG PO TABS: 30.0000 mg | ORAL_TABLET | Freq: Four times a day (QID) | ORAL | 3 refills | Status: DC | PRN

## 2022-05-03 NOTE — Telephone Encounter (Signed)
*  STAT* If patient is at the pharmacy, call can be transferred to refill team.   1. Which medications need to be refilled? (please list name of each medication and dose if known) diltiazem (CARDIZEM) 30 MG tablet   2. Which pharmacy/location (including street and city if local pharmacy) is medication to be sent to? [id number] - [place], [place] - [address] AT [address]   3. Do they need a 30 day or 90 day supply? [place]

## 2022-05-07 ENCOUNTER — Other Ambulatory Visit: Payer: Self-pay | Admitting: Family Medicine

## 2022-05-07 DIAGNOSIS — E782 Mixed hyperlipidemia: Secondary | ICD-10-CM

## 2022-05-16 DIAGNOSIS — R300 Dysuria: Secondary | ICD-10-CM | POA: Diagnosis not present

## 2022-05-16 DIAGNOSIS — N390 Urinary tract infection, site not specified: Secondary | ICD-10-CM | POA: Diagnosis not present

## 2022-05-16 DIAGNOSIS — B3731 Acute candidiasis of vulva and vagina: Secondary | ICD-10-CM | POA: Diagnosis not present

## 2022-05-17 ENCOUNTER — Telehealth: Payer: Self-pay

## 2022-05-17 NOTE — Telephone Encounter (Signed)
Megan Galvan called to request that her rybelsus be changed to something else.  She has been seen twice with GYN for a yeast infection and she is concerned about the cost.  She only has 1 tablet left.

## 2022-05-21 ENCOUNTER — Other Ambulatory Visit: Payer: Self-pay | Admitting: Family Medicine

## 2022-05-21 ENCOUNTER — Telehealth: Payer: Self-pay

## 2022-05-21 MED ORDER — INSULIN GLARGINE 100 UNIT/ML SOLOSTAR PEN: 10.0000 [IU] | PEN_INJECTOR | Freq: Every day | SUBCUTANEOUS | 11 refills | Status: DC

## 2022-05-21 NOTE — Progress Notes (Signed)
Care Management & Coordination Services Pharmacy Team  Reason for Encounter: Diabetes  Contacted patient to discuss diabetes disease state.  Current antihyperglycemic regimen:  Semaglutide '14mg'$  daily (Took last pill of samples)    I informed Megan Galvan I have tried twice to get in touch with PAP company to check on her application and so has she but she can't get in touch with anyone. Application was faxed in on 04/15/22.  She also informed me that since taking Rybelsus she has had nothing but issues with yeast infections and UTI's. She also stated she has felt very agitated since starting it as well. I discussed with her the note Dr. [name] made about wanting to start her on Insulin but Megan Galvan is reluctant and wants to maybe try something else before insulin. As of right now Megan Galvan is not on anything for sugars and wants the CPP to advise on the Rybelsus and what else we can put her on.   What recent interventions/DTPs have been made to improve glycemic control:  No changes   Have there been any recent hospitalizations or ED visits since last visit with PharmD? No  Patient denies hypoglycemic symptoms, including None  Patient reports hyperglycemic symptoms, including excessive thirst  How often are you checking your blood sugar? in the morning before eating or drinking  What are your blood sugars ranging?  Fasting: 05/12/22 127, 05/14/22 158, 05/15/22 112, 05/16/22 121,05/19/22 131 After Meal: 05/09/22 151, 05/10/22 191, 05/11/22 159  During the week, how often does your blood glucose drop below 70? Never  Are you checking your feet daily/regularly? Yes  Adherence Review: Is the patient currently on a STATIN medication? Yes Is the patient currently on ACE/ARB medication? Yes Does the patient have >5 day gap between last estimated fill dates? No  Chart Updates:  Recent office visits:  05/07/22 Meredith Mody RN. Telephone encounter. Recommend start on lantus insulin 10 U daily.   03/28/22 Rochel Brome  MD. Seen for routine visit. Increase Rybelsus to '14mg'$ .   Recent consult visits:  None  Hospital visits:  None  Medications: Outpatient Encounter Medications as of 05/21/2022  Medication Sig   potassium chloride SA (KLOR-CON M) 20 MEQ tablet Take 2 tablets (40 mEq total) by mouth 2 (two) times daily.   Accu-Chek Softclix Lancets lancets 1 each by Other route daily.   acetaminophen (TYLENOL) 500 MG tablet Take 1,000 mg by mouth every 6 (six) hours as needed for moderate pain or headache.   aspirin EC 81 MG tablet Take 1 tablet (81 mg total) by mouth daily. Swallow whole.   Blood Glucose Monitoring Suppl (ACCU-CHEK GUIDE ME) w/Device KIT Use as directed   Calcium Carb-Cholecalciferol (CALCIUM 600 + D PO) Take 2 tablets by mouth daily.   cetirizine (ZYRTEC) 10 MG tablet Take 10 mg by mouth daily.   chlorthalidone (HYGROTON) 50 MG tablet TAKE 1 TABLET BY MOUTH DAILY   diltiazem (CARDIZEM) 30 MG tablet Take 1 tablet (30 mg total) by mouth every 6 (six) hours as needed (If your heart rate is greater than 100.).   famotidine (PEPCID) 40 MG tablet TAKE 1 TABLET BY MOUTH AT  BEDTIME   fluticasone (FLONASE) 50 MCG/ACT nasal spray Place 1 spray into the nose daily as needed for allergies.   glucose blood (ACCU-CHEK GUIDE) test strip 1 each by Other route daily in the afternoon.   hydrocortisone (ANUSOL-HC) 25 MG suppository Place 1 suppository (25 mg total) rectally 2 (two) times daily.   linaclotide (LINZESS) 145  MCG CAPS capsule Take 1 capsule (145 mcg total) by mouth daily before breakfast.   Melatonin 5 MG CAPS Take 5 mg by mouth at bedtime.   Multiple Vitamin (MULTIVITAMIN WITH MINERALS) TABS tablet Take 1 tablet by mouth daily.   omeprazole (PRILOSEC) 40 MG capsule TAKE 1 CAPSULE BY MOUTH  TWICE DAILY BEFORE MEALS   pravastatin (PRAVACHOL) 40 MG tablet TAKE 1 TABLET BY MOUTH AT  BEDTIME   PREMARIN vaginal cream [id number]:8.8 Applicator Vaginal Daily   Semaglutide (RYBELSUS) 14 MG TABS Take 1  tablet (14 mg total) by mouth daily.   traZODone (DESYREL) 100 MG tablet TAKE 1 TABLET BY MOUTH BEFORE  BEDTIME   valsartan (DIOVAN) 320 MG tablet TAKE 1 TABLET BY MOUTH DAILY   No facility-administered encounter medications on file as of 05/21/2022.    Recent Relevant Labs: Lab Results  Component Value Date/Time   HGBA1C 7.8 (H) 03/28/2022 11:52 AM   HGBA1C 7.3 (H) 11/12/2021 11:12 AM   MICROALBUR 10 12/13/2020 10:44 AM   MICROALBUR 10 05/09/2020 11:32 AM    Kidney Function Lab Results  Component Value Date/Time   CREATININE 0.84 03/28/2022 11:52 AM   CREATININE 0.89 11/12/2021 11:12 AM   GFRNONAA >60 08/13/2020 05:29 AM   GFRAA 84 05/09/2020 11:32 AM    Star Rating Drugs:  Medication:  Last Fill: Day Supply Semaglutide Waiting on PAP  Care Gaps: Annual wellness visit in last year? Yes, 09/13/21 Last eye exam / retinopathy screening: 03/21/21 Last diabetic foot exam:03/28/22   Elray Mcgregor, [place] Pharmacist Assistant  [phone]

## 2022-05-21 NOTE — Telephone Encounter (Signed)
Patient agreed to try the Lantus.  Medication sent to [facility] in [place] per Dr. [name].

## 2022-05-21 NOTE — Telephone Encounter (Signed)
Patient called about her Rybelsus.  She states she is out and its too expensive for her to buy.  Is there anything else she can take? Please advise

## 2022-05-23 ENCOUNTER — Other Ambulatory Visit: Payer: Self-pay

## 2022-05-23 NOTE — Telephone Encounter (Signed)
Lantus had to be ordered but according to [facility] in [place], it will be available tomorrow after lunch.

## 2022-05-24 ENCOUNTER — Other Ambulatory Visit: Payer: Self-pay

## 2022-05-24 MED ORDER — INSULIN GLARGINE 100 UNIT/ML SOLOSTAR PEN: 10.0000 [IU] | PEN_INJECTOR | Freq: Every day | SUBCUTANEOUS | 11 refills | Status: DC

## 2022-05-25 ENCOUNTER — Other Ambulatory Visit: Payer: Self-pay | Admitting: Physician Assistant

## 2022-05-27 NOTE — Telephone Encounter (Signed)
Patient called and stated she is afraid to take the insulin, patient stated she look on internet about medication and is scared to take the insulin due to reading the side effects.  She worry about taking the medication and not getting the air bubbles out, I explain to her how the pen works and once you release the air in it the first time it is use, there is no worry about air and it is not like using the insulin in the bottles that you have to pull up medication yourselves and explain that to her and her husband also offer them to come in and get education on the use of the pen by a nurse.   Another concern was that the patient sugars has been in the 130s fasting and 170s after dinner and wanted to know what would be a time she doesn't take it due to her sugars not being elevated, stated she was scared to take the medication and her sugar bottoms out in her sleep. Please advise

## 2022-05-28 ENCOUNTER — Other Ambulatory Visit: Payer: Self-pay

## 2022-05-28 DIAGNOSIS — I152 Hypertension secondary to endocrine disorders: Secondary | ICD-10-CM

## 2022-05-28 MED ORDER — PIOGLITAZONE HCL 15 MG PO TABS: 15.0000 mg | ORAL_TABLET | Freq: Every day | ORAL | 0 refills | Status: DC

## 2022-05-28 NOTE — Telephone Encounter (Signed)
Patient agrees to medication, made aware of side effects, verbalized understanding.

## 2022-06-04 DIAGNOSIS — G4733 Obstructive sleep apnea (adult) (pediatric): Secondary | ICD-10-CM | POA: Diagnosis not present

## 2022-06-10 DIAGNOSIS — N390 Urinary tract infection, site not specified: Secondary | ICD-10-CM | POA: Diagnosis not present

## 2022-06-10 DIAGNOSIS — B3731 Acute candidiasis of vulva and vagina: Secondary | ICD-10-CM | POA: Diagnosis not present

## 2022-06-11 ENCOUNTER — Ambulatory Visit: Payer: Medicare Other | Admitting: Gastroenterology

## 2022-06-11 ENCOUNTER — Ambulatory Visit (INDEPENDENT_AMBULATORY_CARE_PROVIDER_SITE_OTHER): Payer: Medicare Other | Admitting: Gastroenterology

## 2022-06-11 ENCOUNTER — Encounter: Payer: Self-pay | Admitting: Gastroenterology

## 2022-06-11 VITALS — BP 138/70 | HR 78 | Ht 66.0 in | Wt 207.4 lb

## 2022-06-11 DIAGNOSIS — K581 Irritable bowel syndrome with constipation: Secondary | ICD-10-CM

## 2022-06-11 DIAGNOSIS — K6289 Other specified diseases of anus and rectum: Secondary | ICD-10-CM

## 2022-06-11 MED ORDER — FLUTICASONE PROPIONATE 0.05 % EX CREA: TOPICAL_CREAM | Freq: Two times a day (BID) | CUTANEOUS | 2 refills | Status: DC

## 2022-06-11 NOTE — Progress Notes (Signed)
IMPRESSION and PLAN:    #1. Rectal discomfort likely d/t int Hoids.  I did not appreciate a fissure today.  #2. IBS-C  #3. H/O polyps- colon 01/2018. Next due this year.   Plan:  -metamucil 1 TBS p.o. QD with 8 oz of water. -fluticasone cream 0.05% generic 30g 1 bid PR x 10 days, 2 refills -Avoid NSAIDs. -Colon after cardio clearence with miralax -Call if still with problems in 2 weeks.  If still with problems, diltiazem cream/NTG ointment.       Discussed risks & benefits of colonoscopy. Risks including rare perforation req laparotomy, bleeding after bx/polypectomy req blood transfusion, rarely missing neoplasms, risks of anesthesia/sedation, rare risk of damage to internal organs. Benefits outweigh the risks. Patient agrees to proceed. All the questions were answered. Pt consents to proceed.  HPI:    Chief Complaint:   Megan Galvan is a 72 y.o. female  With AS s/p bioprosthetic AVR 07/2020 with post-op Afib DM2, HLD, HTN, OSA, obesity For follow-up visit  Back to having constipation.  This is under good control with Apple, activia, mirlax.  She took Linzess 145 mcg which resulted in diarrhea.  She had failed Amitiza in past.  Complaining of rectal discomfort and "hemorrhoidal problems" every time she has a bowel movement.  No melena or hematochezia.  She is s/p hemorrhoidectomy by Dr. 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  She was treated briefly with diltiazem cream.  Denies having any upper GI symptoms including nausea, vomiting, heartburn, regurgitation, odynophagia or dysphagia.  No abdominal pain.  From cardiology standpoint she follows up with Dr. Bettina Gavia.   Past GI procedures:  EGD 11/2019; -Minimal gastritis  Colonoscopy 01/2018 -Colonic polyp status post polypectomy -Predominantly left colonic diverticulosis. -Non-bleeding internal hemorrhoids. -Otherwise normal colonoscopy. -Next due next  01/2023  Hemorrhoidectomy 02/2019 by Dr. Amalia Hailey -Chronic anal fissure and hemorrhoids  CTA 07/2020 1. Vascular findings and measurements pertinent to potential TAVR procedure, as detailed above. 2. Severe thickening calcification of the aortic valve, compatible with reported clinical history of severe aortic stenosis. 3.  Aortic Atherosclerosis (ICD10-I70.0). 4. 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 5. Colonic diverticulosis without evidence of acute diverticulitis at this time  Past Medical History:  Diagnosis Date   Allergy    Arthritis    Cataract    Diabetes mellitus without complication ([place])    Nonrheumatic aortic (valve) stenosis    Nonrheumatic aortic (valve) stenosis    Plantar fasciitis    S/P aortic valve replacement with bioprosthetic valve 08/10/2020   21 mm Edwards Resilia Inspiria Bioprosthetic Tissue Valve  SN [id number] Model 11500A   Sleep apnea    cpap    Past Medical History:  Diagnosis Date   Arthritis    Bone spur of acromioclavicular joint, left    GERD (gastroesophageal reflux disease)    Hyperlipidemia    Hypertension    Plantar fasciitis     Current Outpatient Medications  Medication Sig Dispense Refill   Acetaminophen 500 MG coapsule 1 capsule as needed.     Azilsartan Medoxomil 40 MG TABS Take 2 tablets by mouth daily.     CALCIUM CITRATE-VITAMIN D PO Take 1 tablet by mouth daily.     cetirizine (ZYRTEC) 10 MG tablet Take 1 tablet by mouth daily.     chlorthalidone (HYGROTON) 25 MG tablet Take 1 tablet by mouth daily.     fluticasone (FLONASE) 50  MCG/ACT nasal spray Place 1 spray into the nose as needed.     lansoprazole (PREVACID) 30 MG capsule Take 1 capsule by mouth 2 (two) times daily.     Melatonin 5 MG TABS Take 1 tablet by mouth at bedtime.     metFORMIN (GLUCOPHAGE) 1000 MG tablet Take 2 tablets by mouth.     metoprolol succinate (TOPROL-XL) 50 MG 24 hr tablet Take 1 tablet by mouth daily.      Multiple Vitamin (MULTI-VITAMINS) TABS Take 1 tablet by mouth daily.     potassium chloride SA (K-DUR,KLOR-CON) 20 MEQ tablet Take 3 tablets by mouth daily.     pravastatin (PRAVACHOL) 40 MG tablet Take 1 tablet by mouth at bedtime.     PROCTOZONE-HC 2.5 % rectal cream APPLY A THIN LAYER TO THE AFFECTED AREA RECTALLY TWICE DAILY  0   ranitidine (ZANTAC) 300 MG tablet Take 1 tablet by mouth at bedtime.     traZODone (DESYREL) 100 MG tablet Take 1 tablet by mouth at bedtime.     No current facility-administered medications for this visit.     Past Surgical History:  Procedure Laterality Date   BACK SURGERY  11/17/2016   L3-L5   BILATERAL CARPAL TUNNEL RELEASE     bone spur Left    left shoulder   bone spur Right    Right shoulder   COLONOSCOPY  08/22/2011   Moderate predominantly sigmoid diverticulosis. Small internal hemorrhoids.    NECK SURGERY     NECK SURGERY     torn rotator cuff Right    Right shoulder   torn rotator cuff Left    left shoulder   TRIGGER FINGER RELEASE Left    thumb   TRIGGER FINGER RELEASE Right    thumb and middle finger   WRIST SURGERY Left    torn ligament   WRIST SURGERY Right    cyst    Family History  Problem Relation Age of Onset   Colon cancer Paternal Grandmother     Social History   Tobacco Use   Smoking status: Never Smoker   Smokeless tobacco: Never Used  Substance Use Topics   Alcohol use: Never    Frequency: Never   Drug use: Never    Allergies  Allergen Reactions   Doxycycline     Rash    Latex     Burn and itch     Review of Systems: All systems reviewed and negative except where noted in HPI.    Physical Exam:     BP 128/72   Pulse 75   Ht 5' 6"$  (1.676 m)   Wt 210 lb 4 oz (95.4 kg)   BMI 33.94 kg/m  GENERAL:  Alert, oriented, cooperative, not in acute distress. PSYCH: :Pleasant, normal mood and affect. ABDOMEN: Inspection: No visible peristalsis, no abnormal pulsations, skin normal.   Palpation/percussion: Soft, nontender, nondistended, no rigidity, no abnormal dullness to percussion, no hepatosplenomegaly and no palpable abdominal masses.  Auscultation: Normal bowel sounds, no abdominal bruits. Rectal exam: In presence of [place], no obvious fissures or hemorrhoids.  Nontender, mild perianal excoriation.  Stool brown heme-negative.     Ronaldo Miyamoto   CC Cox, Kirsten, MD

## 2022-06-11 NOTE — Patient Instructions (Addendum)
_______________________________________________________  If your blood pressure at your visit was 140/90 or greater, please contact your primary care physician to follow up on this.  _______________________________________________________  If you are age 71 or older, your body mass index should be between 23-30. Your Body mass index is 33.47 kg/m. If this is out of the aforementioned range listed, please consider follow up with your Primary Care Provider.  If you are age 77 or younger, your body mass index should be between 19-25. Your Body mass index is 33.47 kg/m. If this is out of the aformentioned range listed, please consider follow up with your Primary Care Provider.   ________________________________________________________  The [facility] providers would like to encourage you to use [facility] to communicate with providers for non-urgent requests or questions.  Due to long hold times on the telephone, sending your provider a message by [facility] may be a faster and more efficient way to get a response.  Please allow 48 business hours for a response.  Please remember that this is for non-urgent requests.  _______________________________________________________  Please purchase the following medications over the counter and take as directed: Metamucil 1 tablespoon daily in [place] of water  We have sent the following medications to your pharmacy for you to pick up at your convenience: Cutivate  Avoid NSAID's  Repeat colonoscopy for October 2024. Please call 2 months prior to schedule this. A letter will be sent as it gets closer.   Thank you,  Dr. Jackquline Denmark

## 2022-06-17 ENCOUNTER — Other Ambulatory Visit: Payer: Self-pay | Admitting: Family Medicine

## 2022-06-17 ENCOUNTER — Encounter: Payer: Self-pay | Admitting: Family Medicine

## 2022-06-17 ENCOUNTER — Ambulatory Visit (INDEPENDENT_AMBULATORY_CARE_PROVIDER_SITE_OTHER): Payer: Medicare Other | Admitting: Family Medicine

## 2022-06-17 VITALS — BP 138/62 | HR 86 | Temp 97.2°F | Ht 66.0 in | Wt 203.0 lb

## 2022-06-17 DIAGNOSIS — N7681 Mucositis (ulcerative) of vagina and vulva: Secondary | ICD-10-CM | POA: Diagnosis not present

## 2022-06-17 DIAGNOSIS — N952 Postmenopausal atrophic vaginitis: Secondary | ICD-10-CM

## 2022-06-17 DIAGNOSIS — N3000 Acute cystitis without hematuria: Secondary | ICD-10-CM | POA: Diagnosis not present

## 2022-06-17 DIAGNOSIS — R82998 Other abnormal findings in urine: Secondary | ICD-10-CM | POA: Diagnosis not present

## 2022-06-17 DIAGNOSIS — E1142 Type 2 diabetes mellitus with diabetic polyneuropathy: Secondary | ICD-10-CM | POA: Diagnosis not present

## 2022-06-17 LAB — POCT URINALYSIS DIP (CLINITEK)
Bilirubin, UA: NEGATIVE
Blood, UA: NEGATIVE
Glucose, UA: NEGATIVE mg/dL
Ketones, POC UA: NEGATIVE mg/dL
Nitrite, UA: NEGATIVE
POC PROTEIN,UA: NEGATIVE
Spec Grav, UA: 1.015 (ref 1.010–1.025)
Urobilinogen, UA: 0.2 E.U./dL
pH, UA: 6 (ref 5.0–8.0)

## 2022-06-17 MED ORDER — PREMARIN 0.625 MG/GM VA CREA: TOPICAL_CREAM | VAGINAL | 1 refills | Status: DC

## 2022-06-17 NOTE — Progress Notes (Unsigned)
v  Subjective:  Patient ID: Megan Galvan, female    DOB: 12-Oct-1950  Age: 72 y.o. MRN: [id number]  Chief Complaint  Patient presents with   Urinary Tract Infection   Diabetes    HPI DMII currently taking actos 15 mg daily. Last A1C 7.8. Patient felt the rybelsus caused yeast infection. Has eye appt in 08/2022. Checks feet daily, checks FBS daily 119-204.  Complaining of recurrent UTIs and yeast infections. Patient is seeing gynecology and has numerous creams. She is concerned she now has a cyst/bump that is very painful on left side of labia.       03/28/2022   11:45 AM 09/17/2021    2:16 PM 07/13/2021   10:55 AM 06/14/2021    3:55 PM 03/16/2021    9:42 AM  Depression screen PHQ 2/9  Decreased Interest 0 0 0 2 0  Down, Depressed, Hopeless 0 0 0 0 0  PHQ - 2 Score 0 0 0 2 0  Altered sleeping    0   Tired, decreased energy    0   Change in appetite    0   Feeling bad or failure about yourself     0   Trouble concentrating    0   Moving slowly or fidgety/restless    0   Suicidal thoughts    0   PHQ-9 Score    2   Difficult doing work/chores    Not difficult at all          12/13/2020    9:41 AM 03/16/2021    9:42 AM 07/13/2021   10:54 AM 09/17/2021    2:19 PM 06/17/2022    3:36 PM  Fall Risk  Falls in the past year? 0 0 0 0 0  Was there an injury with Fall? 0 0 0 0 0  Fall Risk Category Calculator 0 0 0 0 0  Fall Risk Category (Retired) Low Low Low Low   (RETIRED) Patient Fall Risk Level Low fall risk   Low fall risk   Patient at Risk for Falls Due to No Fall Risks   No Fall Risks No Fall Risks  Fall risk Follow up Falls evaluation completed Falls evaluation completed Falls evaluation completed Falls evaluation completed;Education provided;Falls prevention discussed Falls evaluation completed      Review of Systems  Constitutional:  Negative for chills, fatigue and fever.  HENT:  Negative for congestion, ear pain and sore throat.   Respiratory:  Negative for cough  and shortness of breath.   Cardiovascular:  Negative for chest pain and palpitations.  Gastrointestinal:  Negative for abdominal pain, constipation, diarrhea, nausea and vomiting.  Endocrine: Negative for polydipsia, polyphagia and polyuria.  Genitourinary:  Positive for dysuria. Negative for difficulty urinating.  Musculoskeletal:  Negative for arthralgias, back pain and myalgias.  Skin:  Negative for rash.  Neurological:  Negative for headaches.  Psychiatric/Behavioral:  Negative for dysphoric mood. The patient is not nervous/anxious.     Current Outpatient Medications on File Prior to Visit  Medication Sig Dispense Refill   Accu-Chek Softclix Lancets lancets 1 each by Other route daily. 100 each 0   acetaminophen (TYLENOL) 500 MG tablet Take 1,000 mg by mouth every 6 (six) hours as needed for moderate pain or headache.     aspirin EC 81 MG tablet Take 1 tablet (81 mg total) by mouth daily. Swallow whole. 90 tablet 3   Blood Glucose Monitoring Suppl (ACCU-CHEK GUIDE ME) w/Device KIT Use as directed 1  kit 0   Calcium Carb-Cholecalciferol (CALCIUM 600 + D PO) Take 2 tablets by mouth daily.     cetirizine (ZYRTEC) 10 MG tablet Take 10 mg by mouth daily.     chlorthalidone (HYGROTON) 50 MG tablet TAKE 1 TABLET BY MOUTH DAILY 100 tablet 2   diltiazem (CARDIZEM) 30 MG tablet Take 1 tablet (30 mg total) by mouth every 6 (six) hours as needed (If your heart rate is greater than 100.). 30 tablet 3   famotidine (PEPCID) 40 MG tablet TAKE 1 TABLET BY MOUTH AT  BEDTIME 100 tablet 1   fluconazole (DIFLUCAN) 150 MG tablet Take 150 mg by mouth. For 3 days     fluticasone (CUTIVATE) 0.05 % cream Apply topically 2 (two) times daily. For 10 days to rectum 30 g 2   fluticasone (FLONASE) 50 MCG/ACT nasal spray Place 1 spray into the nose daily as needed for allergies.     glucose blood (ACCU-CHEK GUIDE) test strip 1 each by Other route daily in the afternoon. 100 each 3   hydrocortisone (ANUSOL-HC) 25 MG  suppository Place 1 suppository (25 mg total) rectally 2 (two) times daily. 30 suppository 2   linaclotide (LINZESS) 145 MCG CAPS capsule Take 1 capsule (145 mcg total) by mouth daily before breakfast. 30 capsule 5   Melatonin 5 MG CAPS Take 5 mg by mouth at bedtime.     Multiple Vitamin (MULTIVITAMIN WITH MINERALS) TABS tablet Take 1 tablet by mouth daily.     omeprazole (PRILOSEC) 40 MG capsule TAKE 1 CAPSULE BY MOUTH TWICE  DAILY BEFORE MEALS 200 capsule 2   pioglitazone (ACTOS) 15 MG tablet Take 1 tablet (15 mg total) by mouth daily. 90 tablet 0   potassium chloride SA (KLOR-CON M) 20 MEQ tablet Take 2 tablets (40 mEq total) by mouth 2 (two) times daily. 180 tablet 2   pravastatin (PRAVACHOL) 40 MG tablet TAKE 1 TABLET BY MOUTH AT  BEDTIME 100 tablet 2   sulfamethoxazole-trimethoprim (BACTRIM DS) 800-160 MG tablet Take 1 tablet by mouth 2 (two) times daily.     traZODone (DESYREL) 100 MG tablet TAKE 1 TABLET BY MOUTH BEFORE  BEDTIME 90 tablet 3   valsartan (DIOVAN) 320 MG tablet TAKE 1 TABLET BY MOUTH DAILY 100 tablet 2   No current facility-administered medications on file prior to visit.   Past Medical History:  Diagnosis Date   Allergy    Arthritis    Cataract    Diabetes mellitus without complication (HCC)    Nonrheumatic aortic (valve) stenosis    Nonrheumatic aortic (valve) stenosis    Plantar fasciitis    S/P aortic valve replacement with bioprosthetic valve 08/10/2020   21 mm Edwards Resilia Inspiria Bioprosthetic Tissue Valve  SN [id number] Model 11500A   Sleep apnea    cpap   Past Surgical History:  Procedure Laterality Date   ANTERIOR CERVICAL DECOMP/DISCECTOMY FUSION     x 2   AORTIC VALVE REPLACEMENT N/A 08/10/2020   Procedure: AORTIC VALVE REPLACEMENT (AVR) USING INSPIRIS VALVE SIZE 21MM;  Surgeon: Rexene Alberts, MD;  Location: [facility];  Service: Open Heart Surgery;  Laterality: N/A;   BACK SURGERY  11/17/2016   L3-L5   BILATERAL CARPAL TUNNEL RELEASE     bone  spur Left    left shoulder   bone spur Right    Right shoulder   CATARACT EXTRACTION Left 09/18/2021   COLONOSCOPY  08/22/2011   Moderate predominantly sigmoid diverticulosis. Small internal hemorrhoids.  COLONOSCOPY     HEMORROIDECTOMY  02/2019   NECK SURGERY     RIGHT/LEFT HEART CATH AND CORONARY ANGIOGRAPHY N/A 07/27/2020   Procedure: RIGHT/LEFT HEART CATH AND CORONARY ANGIOGRAPHY;  Surgeon: Sherren Mocha, MD;  Location: [facility];  Service: Cardiovascular;  Laterality: N/A;   TEE WITHOUT CARDIOVERSION N/A 08/10/2020   Procedure: TRANSESOPHAGEAL ECHOCARDIOGRAM (TEE);  Surgeon: Rexene Alberts, MD;  Location: [facility];  Service: Open Heart Surgery;  Laterality: N/A;   torn rotator cuff Right    Right shoulder   torn rotator cuff Left    left shoulder   TRIGGER FINGER RELEASE Left    thumb   TRIGGER FINGER RELEASE Right    thumb and middle finger   UPPER GASTROINTESTINAL ENDOSCOPY     WRIST SURGERY Left    torn ligament   WRIST SURGERY Right    cyst    Family History  Problem Relation Age of Onset   Diabetes Mother    Colon cancer Paternal Grandmother    Esophageal cancer Neg Hx    Rectal cancer Neg Hx    Stomach cancer Neg Hx    Breast cancer Neg Hx    Social History   Socioeconomic History   Marital status: Married    Spouse name: Eddie   Number of children: 2   Years of education: Not on file   Highest education level: Not on file  Occupational History   Not on file  Tobacco Use   Smoking status: Never   Smokeless tobacco: Never  Vaping Use   Vaping Use: Never used  Substance and Sexual Activity   Alcohol use: Never   Drug use: Never   Sexual activity: Not on file  Other Topics Concern   Not on file  Social History Narrative   Not on file   Social Determinants of Health   Financial Resource Strain: High Risk (12/05/2021)   Overall Financial Resource Strain (CARDIA)    Difficulty of Paying Living Expenses: Hard  Food Insecurity: No Food  Insecurity (09/30/2019)   Hunger Vital Sign    Worried About Running Out of Food in the Last Year: Never true    Ran Out of Food in the Last Year: Never true  Transportation Needs: No Transportation Needs (12/05/2021)   PRAPARE - Hydrologist (Medical): No    Lack of Transportation (Non-Medical): No  Physical Activity: Inactive (06/17/2022)   Exercise Vital Sign    Days of Exercise per Week: 0 days    Minutes of Exercise per Session: 0 min  Stress: No Stress Concern Present (06/17/2022)   [place]    Feeling of Stress : Not at all  Social Connections: Moderately Integrated (06/17/2022)   Social Connection and Isolation Panel [NHANES]    Frequency of Communication with Friends and Family: More than three times a week    Frequency of Social Gatherings with Friends and Family: More than three times a week    Attends Religious Services: More than 4 times per year    Active Member of [employer] or Organizations: No    Attends Music therapist: Never    Marital Status: Married    Objective:  BP 138/62   Pulse 86   Temp (!) 97.2 F (36.2 C)   Ht '5\' 6"'$  (1.676 m)   Wt 203 lb (92.1 kg)   SpO2 97%   BMI 32.77 kg/m  06/17/2022    3:31 PM 06/11/2022    3:47 PM 03/28/2022   11:47 AM  BP/Weight  Systolic BP 0000000 0000000 [id number]  Diastolic BP 62 70 78  Wt. (Lbs) 203 207.38   BMI 32.77 kg/m2 33.47 kg/m2     Physical Exam Vitals reviewed. Exam conducted with a chaperone present.  Constitutional:      Appearance: Normal appearance.  Cardiovascular:     Rate and Rhythm: Normal rate and regular rhythm.     Heart sounds: Murmur heard.  Pulmonary:     Effort: Pulmonary effort is normal. No respiratory distress.     Breath sounds: Normal breath sounds.  Abdominal:     General: Abdomen is flat. Bowel sounds are normal.     Palpations: Abdomen is soft.     Tenderness: There is no abdominal  tenderness.  Genitourinary:    Labia:        Right: No rash, tenderness or lesion.        Left: Tenderness and lesion present. No rash.      Vagina: Normal.     Cervix: Normal.       Comments: Area internal to labia is mildly erythematous, but internal is whitish mucosa. Tender over area marked above. No cyst. No abscess. Tender Neurological:     Mental Status: She is alert and oriented to person, place, and time.  Psychiatric:        Mood and Affect: Mood normal.        Behavior: Behavior normal.     Diabetic Foot Exam - Simple   No data filed      Lab Results  Component Value Date   WBC 6.6 03/28/2022   HGB 13.3 03/28/2022   HCT 38.9 03/28/2022   PLT 241 03/28/2022   GLUCOSE 142 (H) 03/28/2022   CHOL 124 03/28/2022   TRIG 103 03/28/2022   HDL 42 03/28/2022   LDLCALC 63 03/28/2022   ALT 32 03/28/2022   AST 37 03/28/2022   NA 142 03/28/2022   K 3.4 (L) 03/28/2022   CL 95 (L) 03/28/2022   CREATININE 0.84 03/28/2022   BUN 13 03/28/2022   CO2 29 03/28/2022   TSH 2.360 09/04/2021   INR 1.5 (H) 08/10/2020   HGBA1C 7.8 (H) 03/28/2022   MICROALBUR 10 12/13/2020      Assessment & Plan:    Leukocytes in urine Assessment & Plan: Check UA. Sent for urine culture.  No antibiotics at this time.   Orders: -     Urine Culture -     POCT URINALYSIS DIP (CLINITEK)  Mucositis (ulcerative) of vagina and vulva Assessment & Plan: Start premarin vaginal cream apply twice daily x 2 weeks, then twice a week. Checked for HSV  Orders: -     Premarin; APPLY TWICE A DAY X 2 WEEKS, THEN TWICE A WEEK.  Dispense: 42.5 g; Refill: 1 -     Herpes simplex virus culture  Type 2 diabetes mellitus with diabetic polyneuropathy, without long-term current use of insulin (HCC) Assessment & Plan: Not at goal.  Continue pioglitazone 15 mg once daily as it was only started 2 weeks ago.  Recommend continue to work on eating healthy diet and exercise.     Atrophic vaginitis Assessment  & Plan: Restart premarin cream and apply daily x 2 weeks, then twice a week.   Orders: -     Premarin; APPLY TWICE A DAY X 2 WEEKS, THEN TWICE A WEEK.  Dispense: 42.5 g; Refill:  1     Meds ordered this encounter  Medications   PREMARIN vaginal cream    Sig: APPLY TWICE A DAY X 2 WEEKS, THEN TWICE A WEEK.    Dispense:  42.5 g    Refill:  1    Orders Placed This Encounter  Procedures   Urine Culture   Herpes simplex virus culture   POCT URINALYSIS DIP (CLINITEK)    Total time spent on today's visit was greater than 30 minutes, including both face-to-face time and nonface-to-face time personally spent on review of chart (labs and imaging), discussing labs and goals, discussing further work-up, treatment options, referrals to specialist if needed, reviewing outside records of pertinent, answering patient's questions, and coordinating care.  Follow-up: Return if symptoms worsen or fail to improve.   An After Visit Summary was printed and given to the patient.  Rochel Brome, MD [facility] [phone]

## 2022-06-17 NOTE — Patient Instructions (Signed)
Continue pioglitazone 15 mg once daily.   Start premarin vaginal cream apply twice daily x 2 weeks, then twice a week.

## 2022-06-19 LAB — URINE CULTURE

## 2022-06-19 LAB — HERPES SIMPLEX VIRUS CULTURE

## 2022-06-21 ENCOUNTER — Telehealth: Payer: Self-pay

## 2022-06-21 DIAGNOSIS — N3000 Acute cystitis without hematuria: Secondary | ICD-10-CM | POA: Insufficient documentation

## 2022-06-21 DIAGNOSIS — N7681 Mucositis (ulcerative) of vagina and vulva: Secondary | ICD-10-CM

## 2022-06-21 HISTORY — DX: Mucositis (ulcerative) of vagina and vulva: N76.81

## 2022-06-21 HISTORY — DX: Acute cystitis without hematuria: N30.00

## 2022-06-21 NOTE — Progress Notes (Unsigned)
Care Management & Coordination Services Pharmacy Team  Reason for Encounter: Diabetes  Contacted patient to discuss diabetes disease state. {US HC Outreach:28874}  Current antihyperglycemic regimen:  Pioglitazone '15mg'$  daily  Patient verbally confirms she is taking the above medications as directed. {yes/no:20286}  What diet changes have been made to improve diabetes control?  What recent interventions/DTPs have been made to improve glycemic control:  ***  Have there been any recent hospitalizations or ED visits since last visit with PharmD? {yes/no:20286}  Patient {reports/denies:24182} hypoglycemic symptoms, including {Hypoglycemic Symptoms:3049003}  Patient {reports/denies:24182} hyperglycemic symptoms, including {symptoms; hyperglycemia:17903}  How often are you checking your blood sugar? {BG Testing frequency:23922}  What are your blood sugars ranging?  Fasting: *** Before meals: *** After meals: *** Bedtime: ***  During the week, how often does your blood glucose drop below 70? {LowBGfrequency:24142}  Are you checking your feet daily/regularly? {yes/no:20286}  Adherence Review: Is the patient currently on a STATIN medication? Yes Is the patient currently on ACE/ARB medication? Yes Does the patient have >5 day gap between last estimated fill dates? No   Chart Updates:  Recent office visits:  06/17/22 Rochel Brome MD. Seen for Acute Cystitis. Start premarin vaginal cream apply twice daily x 2 weeks, then twice a week.    06/17/22 Rochel Brome MD. Orders only. Recommend decrease Bactrim to once daily as a preventative.   05/28/22 Orders Only. D/C Semaglutide '14mg'$  and Lantus 100units. Ordered Pioglitazone '15mg'$  daily.   Recent consult visits:  06/11/22 (Gastroenterology) Jackquline Denmark MD. Seen for Rectal discomfort. Sent fluticasone cream.   Hospital visits:  None  Medications: Outpatient Encounter Medications as of 06/21/2022  Medication Sig   Accu-Chek Softclix  Lancets lancets 1 each by Other route daily.   acetaminophen (TYLENOL) 500 MG tablet Take 1,000 mg by mouth every 6 (six) hours as needed for moderate pain or headache.   aspirin EC 81 MG tablet Take 1 tablet (81 mg total) by mouth daily. Swallow whole.   Blood Glucose Monitoring Suppl (ACCU-CHEK GUIDE ME) w/Device KIT Use as directed   Calcium Carb-Cholecalciferol (CALCIUM 600 + D PO) Take 2 tablets by mouth daily.   cetirizine (ZYRTEC) 10 MG tablet Take 10 mg by mouth daily.   chlorthalidone (HYGROTON) 50 MG tablet TAKE 1 TABLET BY MOUTH DAILY   diltiazem (CARDIZEM) 30 MG tablet Take 1 tablet (30 mg total) by mouth every 6 (six) hours as needed (If your heart rate is greater than 100.).   famotidine (PEPCID) 40 MG tablet TAKE 1 TABLET BY MOUTH AT  BEDTIME   fluconazole (DIFLUCAN) 150 MG tablet Take 150 mg by mouth. For 3 days   fluticasone (CUTIVATE) 0.05 % cream Apply topically 2 (two) times daily. For 10 days to rectum   fluticasone (FLONASE) 50 MCG/ACT nasal spray Place 1 spray into the nose daily as needed for allergies.   glucose blood (ACCU-CHEK GUIDE) test strip 1 each by Other route daily in the afternoon.   hydrocortisone (ANUSOL-HC) 25 MG suppository Place 1 suppository (25 mg total) rectally 2 (two) times daily.   linaclotide (LINZESS) 145 MCG CAPS capsule Take 1 capsule (145 mcg total) by mouth daily before breakfast.   Melatonin 5 MG CAPS Take 5 mg by mouth at bedtime.   Multiple Vitamin (MULTIVITAMIN WITH MINERALS) TABS tablet Take 1 tablet by mouth daily.   omeprazole (PRILOSEC) 40 MG capsule TAKE 1 CAPSULE BY MOUTH TWICE  DAILY BEFORE MEALS   pioglitazone (ACTOS) 15 MG tablet Take 1 tablet (15 mg  total) by mouth daily.   potassium chloride SA (KLOR-CON M) 20 MEQ tablet Take 2 tablets (40 mEq total) by mouth 2 (two) times daily.   pravastatin (PRAVACHOL) 40 MG tablet TAKE 1 TABLET BY MOUTH AT  BEDTIME   PREMARIN vaginal cream APPLY TWICE A DAY X 2 WEEKS, THEN TWICE A WEEK.    sulfamethoxazole-trimethoprim (BACTRIM DS) 800-160 MG tablet Take 1 tablet by mouth 2 (two) times daily.   traZODone (DESYREL) 100 MG tablet TAKE 1 TABLET BY MOUTH BEFORE  BEDTIME   valsartan (DIOVAN) 320 MG tablet TAKE 1 TABLET BY MOUTH DAILY   No facility-administered encounter medications on file as of 06/21/2022.    Recent Relevant Labs: Lab Results  Component Value Date/Time   HGBA1C 7.8 (H) 03/28/2022 11:52 AM   HGBA1C 7.3 (H) 11/12/2021 11:12 AM   MICROALBUR 10 12/13/2020 10:44 AM   MICROALBUR 10 05/09/2020 11:32 AM    Kidney Function Lab Results  Component Value Date/Time   CREATININE 0.84 03/28/2022 11:52 AM   CREATININE 0.89 11/12/2021 11:12 AM   GFRNONAA >60 08/13/2020 05:29 AM   GFRAA 84 05/09/2020 11:32 AM    Star Rating Drugs:  Medication:  Last Fill: Day Supply Pioglitazone   05/28/22            90ds  Care Gaps: Annual wellness visit in last year? Yes, 09/13/21 Last eye exam / retinopathy screening: 03/21/21 Last diabetic foot exam:03/28/22   Elray Mcgregor, [place] Pharmacist Assistant  [phone]

## 2022-06-21 NOTE — Assessment & Plan Note (Signed)
Not at goal.  Continue pioglitazone 15 mg once daily as it was only started 2 weeks ago.  Recommend continue to work on eating healthy diet and exercise.

## 2022-06-21 NOTE — Assessment & Plan Note (Addendum)
Start premarin vaginal cream apply twice daily x 2 weeks, then twice a week. Checked for HSV

## 2022-06-21 NOTE — Assessment & Plan Note (Signed)
Ordered UA and Urine culture.

## 2022-06-22 DIAGNOSIS — R82998 Other abnormal findings in urine: Secondary | ICD-10-CM | POA: Insufficient documentation

## 2022-06-22 NOTE — Assessment & Plan Note (Signed)
Restart premarin cream and apply daily x 2 weeks, then twice a week.

## 2022-06-22 NOTE — Assessment & Plan Note (Signed)
Check UA. Sent for urine culture.  No antibiotics at this time.

## 2022-06-28 ENCOUNTER — Ambulatory Visit (INDEPENDENT_AMBULATORY_CARE_PROVIDER_SITE_OTHER): Payer: Medicare Other | Admitting: Family Medicine

## 2022-06-28 ENCOUNTER — Encounter: Payer: Self-pay | Admitting: Family Medicine

## 2022-06-28 VITALS — BP 118/80 | HR 66 | Temp 98.2°F | Ht 66.0 in | Wt 206.0 lb

## 2022-06-28 DIAGNOSIS — K219 Gastro-esophageal reflux disease without esophagitis: Secondary | ICD-10-CM

## 2022-06-28 DIAGNOSIS — E1142 Type 2 diabetes mellitus with diabetic polyneuropathy: Secondary | ICD-10-CM

## 2022-06-28 DIAGNOSIS — I152 Hypertension secondary to endocrine disorders: Secondary | ICD-10-CM

## 2022-06-28 DIAGNOSIS — N952 Postmenopausal atrophic vaginitis: Secondary | ICD-10-CM

## 2022-06-28 DIAGNOSIS — E782 Mixed hyperlipidemia: Secondary | ICD-10-CM | POA: Diagnosis not present

## 2022-06-28 DIAGNOSIS — Z13820 Encounter for screening for osteoporosis: Secondary | ICD-10-CM

## 2022-06-28 DIAGNOSIS — Z780 Asymptomatic menopausal state: Secondary | ICD-10-CM | POA: Diagnosis not present

## 2022-06-28 DIAGNOSIS — Z6833 Body mass index (BMI) 33.0-33.9, adult: Secondary | ICD-10-CM

## 2022-06-28 DIAGNOSIS — E6609 Other obesity due to excess calories: Secondary | ICD-10-CM

## 2022-06-28 DIAGNOSIS — E1159 Type 2 diabetes mellitus with other circulatory complications: Secondary | ICD-10-CM | POA: Diagnosis not present

## 2022-06-28 HISTORY — DX: Encounter for screening for osteoporosis: Z13.820

## 2022-06-28 MED ORDER — HYDROCORTISONE ACETATE 25 MG RE SUPP: 25.0000 mg | Freq: Two times a day (BID) | RECTAL | 2 refills | Status: DC

## 2022-06-28 NOTE — Assessment & Plan Note (Signed)
Well controlled.  No changes to medicines. Continue Chlorthalidone 50 mg daily, Valsartan 320 mg daily.   Continue to work on eating a healthy diet and exercise.  Labs drawn today.

## 2022-06-28 NOTE — Assessment & Plan Note (Signed)
Well controlled.  No changes to medicines. Pravastatin 40 mg daily Continue to work on eating a healthy diet and exercise.  Labs drawn today.

## 2022-06-28 NOTE — Progress Notes (Signed)
Subjective:  Patient ID: Megan Galvan, female    DOB: 1950/11/06  Age: 72 y.o. MRN: [id number]  Chief Complaint  Patient presents with   Diabetes   Hyperlipidemia   Hypertension    HPI   Diabetes: She presents for her follow-up diabetic visit. She has type 2 diabetes mellitus.  Complications: diabetic polyneuropathy. Glucose checking:  daily  Glucose logs: Blood sugar range 112-204. In the last week 125-150. Hypoglycemia:none Most recent A1C: 7.3 Current medications: Actos  Last Eye Exam: overdue has appt 08/2022 Foot checks: daily. Diet: trying. Eating more fruit. Avoiding carbs/sweets.  Exercise: none   Hyperlipidemia: Current medications:  Pravastatin 40 mg daily    Hypertension: Current medications:  Chlorthalidone 50 mg daily, Valsartan 320 mg daily.     GERD: well controlled on famotidine and omeprazole twice daily.         06/28/2022   10:15 AM 03/28/2022   11:45 AM 09/17/2021    2:16 PM 07/13/2021   10:55 AM 06/14/2021    3:55 PM  Depression screen PHQ 2/9  Decreased Interest 0 0 0 0 2  Down, Depressed, Hopeless 0 0 0 0 0  PHQ - 2 Score 0 0 0 0 2  Altered sleeping     0  Tired, decreased energy     0  Change in appetite     0  Feeling bad or failure about yourself      0  Trouble concentrating     0  Moving slowly or fidgety/restless     0  Suicidal thoughts     0  PHQ-9 Score     2  Difficult doing work/chores     Not difficult at all         12/13/2020    9:41 AM 03/16/2021    9:42 AM 07/13/2021   10:54 AM 09/17/2021    2:19 PM 06/17/2022    3:36 PM  Fall Risk  Falls in the past year? 0 0 0 0 0  Was there an injury with Fall? 0 0 0 0 0  Fall Risk Category Calculator 0 0 0 0 0  Fall Risk Category (Retired) Low Low Low Low   (RETIRED) Patient Fall Risk Level Low fall risk   Low fall risk   Patient at Risk for Falls Due to No Fall Risks   No Fall Risks No Fall Risks  Fall risk Follow up Falls evaluation completed Falls evaluation completed Falls  evaluation completed Falls evaluation completed;Education provided;Falls prevention discussed Falls evaluation completed      Review of Systems  Constitutional:  Negative for chills, fatigue and fever.  HENT:  Negative for congestion, ear pain, rhinorrhea and sore throat.   Respiratory:  Negative for cough and shortness of breath.   Cardiovascular:  Negative for chest pain.  Gastrointestinal:  Negative for abdominal pain, constipation, diarrhea, nausea and vomiting.  Genitourinary:  Negative for dysuria and urgency.       Itchy and very irritated.  Musculoskeletal:  Negative for back pain and myalgias.  Neurological:  Negative for dizziness, weakness, light-headedness and headaches.  Psychiatric/Behavioral:  Negative for dysphoric mood. The patient is not nervous/anxious.     Current Outpatient Medications on File Prior to Visit  Medication Sig Dispense Refill   Accu-Chek Softclix Lancets lancets 1 each by Other route daily. 100 each 0   acetaminophen (TYLENOL) 500 MG tablet Take 1,000 mg by mouth every 6 (six) hours as needed for moderate pain or headache.  aspirin EC 81 MG tablet Take 1 tablet (81 mg total) by mouth daily. Swallow whole. 90 tablet 3   Blood Glucose Monitoring Suppl (ACCU-CHEK GUIDE ME) w/Device KIT Use as directed 1 kit 0   Calcium Carb-Cholecalciferol (CALCIUM 600 + D PO) Take 2 tablets by mouth daily.     cetirizine (ZYRTEC) 10 MG tablet Take 10 mg by mouth daily.     chlorthalidone (HYGROTON) 50 MG tablet TAKE 1 TABLET BY MOUTH DAILY 100 tablet 2   diltiazem (CARDIZEM) 30 MG tablet Take 1 tablet (30 mg total) by mouth every 6 (six) hours as needed (If your heart rate is greater than 100.). 30 tablet 3   famotidine (PEPCID) 40 MG tablet TAKE 1 TABLET BY MOUTH AT  BEDTIME 100 tablet 1   fluticasone (CUTIVATE) 0.05 % cream Apply topically 2 (two) times daily. For 10 days to rectum 30 g 2   fluticasone (FLONASE) 50 MCG/ACT nasal spray Place 1 spray into the nose  daily as needed for allergies.     glucose blood (ACCU-CHEK GUIDE) test strip 1 each by Other route daily in the afternoon. 100 each 3   linaclotide (LINZESS) 145 MCG CAPS capsule Take 1 capsule (145 mcg total) by mouth daily before breakfast. 30 capsule 5   Melatonin 5 MG CAPS Take 5 mg by mouth at bedtime.     Multiple Vitamin (MULTIVITAMIN WITH MINERALS) TABS tablet Take 1 tablet by mouth daily.     omeprazole (PRILOSEC) 40 MG capsule TAKE 1 CAPSULE BY MOUTH TWICE  DAILY BEFORE MEALS 200 capsule 2   pioglitazone (ACTOS) 15 MG tablet Take 1 tablet (15 mg total) by mouth daily. 90 tablet 0   potassium chloride SA (KLOR-CON M) 20 MEQ tablet Take 2 tablets (40 mEq total) by mouth 2 (two) times daily. 180 tablet 2   pravastatin (PRAVACHOL) 40 MG tablet TAKE 1 TABLET BY MOUTH AT  BEDTIME 100 tablet 2   PREMARIN vaginal cream APPLY TWICE A DAY X 2 WEEKS, THEN TWICE A WEEK. 42.5 g 1   sulfamethoxazole-trimethoprim (BACTRIM DS) 800-160 MG tablet Take 1 tablet by mouth 2 (two) times daily.     traZODone (DESYREL) 100 MG tablet TAKE 1 TABLET BY MOUTH BEFORE  BEDTIME 90 tablet 3   valsartan (DIOVAN) 320 MG tablet TAKE 1 TABLET BY MOUTH DAILY 100 tablet 2   No current facility-administered medications on file prior to visit.   Past Medical History:  Diagnosis Date   Allergy    Arthritis    Cataract    Diabetes mellitus without complication (HCC)    Nonrheumatic aortic (valve) stenosis    Nonrheumatic aortic (valve) stenosis    Plantar fasciitis    S/P aortic valve replacement with bioprosthetic valve 08/10/2020   21 mm Edwards Resilia Inspiria Bioprosthetic Tissue Valve  SN [id number] Model 11500A   Sleep apnea    cpap   Past Surgical History:  Procedure Laterality Date   ANTERIOR CERVICAL DECOMP/DISCECTOMY FUSION     x 2   AORTIC VALVE REPLACEMENT N/A 08/10/2020   Procedure: AORTIC VALVE REPLACEMENT (AVR) USING INSPIRIS VALVE SIZE 21MM;  Surgeon: Rexene Alberts, MD;  Location: [facility];   Service: Open Heart Surgery;  Laterality: N/A;   BACK SURGERY  11/17/2016   L3-L5   BILATERAL CARPAL TUNNEL RELEASE     bone spur Left    left shoulder   bone spur Right    Right shoulder   CATARACT EXTRACTION Left 09/18/2021  COLONOSCOPY  08/22/2011   Moderate predominantly sigmoid diverticulosis. Small internal hemorrhoids.    COLONOSCOPY     HEMORROIDECTOMY  02/2019   NECK SURGERY     RIGHT/LEFT HEART CATH AND CORONARY ANGIOGRAPHY N/A 07/27/2020   Procedure: RIGHT/LEFT HEART CATH AND CORONARY ANGIOGRAPHY;  Surgeon: Sherren Mocha, MD;  Location: [facility];  Service: Cardiovascular;  Laterality: N/A;   TEE WITHOUT CARDIOVERSION N/A 08/10/2020   Procedure: TRANSESOPHAGEAL ECHOCARDIOGRAM (TEE);  Surgeon: Rexene Alberts, MD;  Location: [facility];  Service: Open Heart Surgery;  Laterality: N/A;   torn rotator cuff Right    Right shoulder   torn rotator cuff Left    left shoulder   TRIGGER FINGER RELEASE Left    thumb   TRIGGER FINGER RELEASE Right    thumb and middle finger   UPPER GASTROINTESTINAL ENDOSCOPY     WRIST SURGERY Left    torn ligament   WRIST SURGERY Right    cyst    Family History  Problem Relation Age of Onset   Diabetes Mother    Colon cancer Paternal Grandmother    Esophageal cancer Neg Hx    Rectal cancer Neg Hx    Stomach cancer Neg Hx    Breast cancer Neg Hx    Social History   Socioeconomic History   Marital status: Married    Spouse name: Eddie   Number of children: 2   Years of education: Not on file   Highest education level: Not on file  Occupational History   Not on file  Tobacco Use   Smoking status: Never   Smokeless tobacco: Never  Vaping Use   Vaping Use: Never used  Substance and Sexual Activity   Alcohol use: Never   Drug use: Never   Sexual activity: Not on file  Other Topics Concern   Not on file  Social History Narrative   Not on file   Social Determinants of Health   Financial Resource Strain: High Risk  (12/05/2021)   Overall Financial Resource Strain (CARDIA)    Difficulty of Paying Living Expenses: Hard  Food Insecurity: No Food Insecurity (09/30/2019)   Hunger Vital Sign    Worried About Running Out of Food in the Last Year: Never true    Ran Out of Food in the Last Year: Never true  Transportation Needs: No Transportation Needs (12/05/2021)   PRAPARE - Hydrologist (Medical): No    Lack of Transportation (Non-Medical): No  Physical Activity: Inactive (06/17/2022)   Exercise Vital Sign    Days of Exercise per Week: 0 days    Minutes of Exercise per Session: 0 min  Stress: No Stress Concern Present (06/17/2022)   [place]    Feeling of Stress : Not at all  Social Connections: Moderately Integrated (06/17/2022)   Social Connection and Isolation Panel [NHANES]    Frequency of Communication with Friends and Family: More than three times a week    Frequency of Social Gatherings with Friends and Family: More than three times a week    Attends Religious Services: More than 4 times per year    Active Member of [employer] or Organizations: No    Attends Music therapist: Never    Marital Status: Married    Objective:  BP 118/80   Pulse 66   Temp 98.2 F (36.8 C)   Ht '5\' 6"'$  (1.676 m)   Wt 206 lb (  93.4 kg)   SpO2 99%   BMI 33.25 kg/m      06/28/2022   10:14 AM 06/17/2022    3:31 PM 06/11/2022    3:47 PM  BP/Weight  Systolic BP 123456 0000000 0000000  Diastolic BP 80 62 70  Wt. (Lbs) 206 203 207.38  BMI 33.25 kg/m2 32.77 kg/m2 33.47 kg/m2    Physical Exam Vitals reviewed.  Constitutional:      Appearance: Normal appearance. She is obese.  Neck:     Vascular: No carotid bruit.  Cardiovascular:     Rate and Rhythm: Normal rate and regular rhythm.     Heart sounds: Normal heart sounds.  Pulmonary:     Effort: Pulmonary effort is normal. No respiratory distress.     Breath sounds: Normal  breath sounds.  Abdominal:     General: Abdomen is flat. Bowel sounds are normal.     Palpations: Abdomen is soft.     Tenderness: There is no abdominal tenderness.  Neurological:     Mental Status: She is alert and oriented to person, place, and time.  Psychiatric:        Mood and Affect: Mood normal.        Behavior: Behavior normal.     Diabetic Foot Exam - Simple   Simple Foot Form Diabetic Foot exam was performed with the following findings: Yes 06/28/2022 10:48 AM  Visual Inspection No deformities, no ulcerations, no other skin breakdown bilaterally: Yes Sensation Testing Intact to touch and monofilament testing bilaterally: Yes Pulse Check Posterior Tibialis and Dorsalis pulse intact bilaterally: Yes Comments      Lab Results  Component Value Date   WBC 6.6 03/28/2022   HGB 13.3 03/28/2022   HCT 38.9 03/28/2022   PLT 241 03/28/2022   GLUCOSE 142 (H) 03/28/2022   CHOL 124 03/28/2022   TRIG 103 03/28/2022   HDL 42 03/28/2022   LDLCALC 63 03/28/2022   ALT 32 03/28/2022   AST 37 03/28/2022   NA 142 03/28/2022   K 3.4 (L) 03/28/2022   CL 95 (L) 03/28/2022   CREATININE 0.84 03/28/2022   BUN 13 03/28/2022   CO2 29 03/28/2022   TSH 2.360 09/04/2021   INR 1.5 (H) 08/10/2020   HGBA1C 7.8 (H) 03/28/2022   MICROALBUR 10 12/13/2020      Assessment & Plan:    Type 2 diabetes mellitus with diabetic polyneuropathy, without long-term current use of insulin ([place]) Assessment & Plan: Control: improved.  Recommend check sugars fasting daily. Recommend check feet daily. Recommend annual eye exams. Medicines: continue actos 15 mg daily.  Continue to work on eating a healthy diet and exercise.  Labs drawn today.     Orders: -     CBC with Differential/Platelet -     Hemoglobin A1c -     Microalbumin / creatinine urine ratio  Gastro-esophageal reflux disease without esophagitis Assessment & Plan: The current medical regimen is effective;  continue present plan and  medications.  Continue famotidine and omeprazole twice daily.     Hypertension associated with diabetes [facility]) Assessment & Plan: Well controlled.  No changes to medicines. Continue Chlorthalidone 50 mg daily, Valsartan 320 mg daily.   Continue to work on eating a healthy diet and exercise.  Labs drawn today.    Orders: -     Comprehensive metabolic panel  Mixed hyperlipidemia Assessment & Plan: Well controlled.  No changes to medicines. Pravastatin 40 mg daily Continue to work on eating a healthy diet and  exercise.  Labs drawn today.    Orders: -     Lipid panel  Encounter for osteoporosis screening in asymptomatic postmenopausal patient -     DG Bone Density; Future  Atrophic vaginitis Assessment & Plan: Improved.  Continue premarin cream.   Class 1 obesity due to excess calories with serious comorbidity and body mass index (BMI) of 33.0 to 33.9 in adult Assessment & Plan: Recommend continue to work on eating healthy diet and exercise. Comorbidities: diabetes and hyperlipidemia.    Other orders -     Hydrocortisone Acetate; Place 1 suppository (25 mg total) rectally 2 (two) times daily.  Dispense: 30 suppository; Refill: 2     Meds ordered this encounter  Medications   hydrocortisone (ANUSOL-HC) 25 MG suppository    Sig: Place 1 suppository (25 mg total) rectally 2 (two) times daily.    Dispense:  30 suppository    Refill:  2    Orders Placed This Encounter  Procedures   DG Bone Density   CBC with Differential/Platelet   Comprehensive metabolic panel   Hemoglobin A1c   Lipid panel   Microalbumin / creatinine urine ratio     Follow-up: Return in about 3 months (around 09/28/2022) for chronic, fasting.   I,Katherina A Bramblett,acting as a scribe for Rochel Brome, MD.,have documented all relevant documentation on the behalf of Rochel Brome, MD,as directed by  Rochel Brome, MD while in the presence of Rochel Brome, MD.   An After Visit Summary was printed  and given to the patient.  I attest that I have reviewed this visit and agree with the plan scribed by my staff.   Rochel Brome, MD [facility] [phone]

## 2022-06-28 NOTE — Assessment & Plan Note (Signed)
The current medical regimen is effective;  continue present plan and medications.  Continue famotidine and omeprazole twice daily.

## 2022-06-28 NOTE — Assessment & Plan Note (Signed)
Recommend continue to work on eating healthy diet and exercise. Comorbidities: diabetes and hyperlipidemia.

## 2022-06-28 NOTE — Assessment & Plan Note (Signed)
Control: improved.  Recommend check sugars fasting daily. Recommend check feet daily. Recommend annual eye exams. Medicines: continue actos 15 mg daily.  Continue to work on eating a healthy diet and exercise.  Labs drawn today.

## 2022-06-28 NOTE — Assessment & Plan Note (Signed)
Improved.  Continue premarin cream.

## 2022-06-29 ENCOUNTER — Other Ambulatory Visit: Payer: Self-pay | Admitting: Family Medicine

## 2022-06-30 LAB — LIPID PANEL
Chol/HDL Ratio: 2.5 ratio (ref 0.0–4.4)
Cholesterol, Total: 113 mg/dL (ref 100–199)
HDL: 45 mg/dL (ref >39)
LDL Chol Calc (NIH): 50 mg/dL (ref 0–99)
Triglycerides: 95 mg/dL (ref 0–149)
VLDL Cholesterol Cal: 18 mg/dL (ref 5–40)

## 2022-06-30 LAB — CBC WITH DIFFERENTIAL/PLATELET
Basophils Absolute: 0.1 10*3/uL (ref 0.0–0.2)
Basos: 1 %
EOS (ABSOLUTE): 0.1 10*3/uL (ref 0.0–0.4)
Eos: 2 %
Hematocrit: 35.6 % (ref 34.0–46.6)
Hemoglobin: 12.3 g/dL (ref 11.1–15.9)
Immature Grans (Abs): 0 10*3/uL (ref 0.0–0.1)
Immature Granulocytes: 0 %
Lymphocytes Absolute: 2.2 10*3/uL (ref 0.7–3.1)
Lymphs: 39 %
MCH: 33.7 pg — ABNORMAL HIGH (ref 26.6–33.0)
MCHC: 34.6 g/dL (ref 31.5–35.7)
MCV: 98 fL — ABNORMAL HIGH (ref 79–97)
Monocytes Absolute: 0.6 10*3/uL (ref 0.1–0.9)
Monocytes: 11 %
Neutrophils Absolute: 2.7 10*3/uL (ref 1.4–7.0)
Neutrophils: 47 %
Platelets: 223 10*3/uL (ref 150–450)
RBC: 3.65 x10E6/uL — ABNORMAL LOW (ref 3.77–5.28)
RDW: 12.7 % (ref 11.7–15.4)
WBC: 5.7 10*3/uL (ref 3.4–10.8)

## 2022-06-30 LAB — MICROALBUMIN / CREATININE URINE RATIO
Creatinine, Urine: 29.4 mg/dL
Microalb/Creat Ratio: <10 mg/g creat (ref 0–29)
Microalbumin, Urine: <3 ug/mL

## 2022-06-30 LAB — COMPREHENSIVE METABOLIC PANEL
ALT: 29 IU/L (ref 0–32)
AST: 36 IU/L (ref 0–40)
Albumin/Globulin Ratio: 1.7 (ref 1.2–2.2)
Albumin: 4.5 g/dL (ref 3.8–4.8)
Alkaline Phosphatase: 56 IU/L (ref 44–121)
BUN/Creatinine Ratio: 17 (ref 12–28)
BUN: 15 mg/dL (ref 8–27)
Bilirubin Total: 0.4 mg/dL (ref 0.0–1.2)
CO2: 27 mmol/L (ref 20–29)
Calcium: 9.7 mg/dL (ref 8.7–10.3)
Chloride: 96 mmol/L (ref 96–106)
Creatinine, Ser: 0.86 mg/dL (ref 0.57–1.00)
Globulin, Total: 2.6 g/dL (ref 1.5–4.5)
Glucose: 127 mg/dL — ABNORMAL HIGH (ref 70–99)
Potassium: 3.7 mmol/L (ref 3.5–5.2)
Sodium: 139 mmol/L (ref 134–144)
Total Protein: 7.1 g/dL (ref 6.0–8.5)
eGFR: 72 mL/min/{1.73_m2} (ref >59)

## 2022-06-30 LAB — CARDIOVASCULAR RISK ASSESSMENT

## 2022-06-30 LAB — HEMOGLOBIN A1C
Est. average glucose Bld gHb Est-mCnc: 154 mg/dL
Hgb A1c MFr Bld: 7 % — ABNORMAL HIGH (ref 4.8–5.6)

## 2022-06-30 NOTE — Progress Notes (Signed)
Blood count normal.  Liver function normal.  Kidney function normal.  Cholesterol: good HBA1C: improved to 7. The current medical regimen is effective;  continue present plan and medications. Not spilling protein in urine.

## 2022-07-04 DIAGNOSIS — N390 Urinary tract infection, site not specified: Secondary | ICD-10-CM | POA: Diagnosis not present

## 2022-07-04 DIAGNOSIS — R1084 Generalized abdominal pain: Secondary | ICD-10-CM | POA: Diagnosis not present

## 2022-07-23 ENCOUNTER — Encounter: Payer: Self-pay | Admitting: Family Medicine

## 2022-08-06 ENCOUNTER — Telehealth: Payer: Self-pay

## 2022-08-06 NOTE — Progress Notes (Signed)
Care Management & Coordination Services Pharmacy Team  Reason for Encounter: Diabetes  Contacted patient to discuss diabetes disease state. Spoke with patient on 08/06/2022   Current antihyperglycemic regimen:  Pioglitazone 15mg  daily  Patient verbally confirms she is taking the above medications as directed. Yes  What diet changes have been made to improve diabetes control? Pt does not eat as much or overeat   What recent interventions/DTPs have been made to improve glycemic control:  No recent changes   Have there been any recent hospitalizations or ED visits since last visit with PharmD? No  Patient denies hypoglycemic symptoms, including None  Patient denies hyperglycemic symptoms, including none  How often are you checking your blood sugar? in the morning before eating or drinking  What are your blood sugars ranging?  Fasting: 137, 135, 137, 128, 137, 131  During the week, how often does your blood glucose drop below 70? Never  Are you checking your feet daily/regularly? Yes  Adherence Review: Is the patient currently on a STATIN medication? Yes Is the patient currently on ACE/ARB medication? Yes Does the patient have >5 day gap between last estimated fill dates? No   Chart Updates:  Recent office visits:  06/28/22 Blane Ohara MD. Seen for routine visit. No med changes.   Recent consult visits:  None  Hospital visits:  None  Medications: Outpatient Encounter Medications as of 08/06/2022  Medication Sig   Accu-Chek Softclix Lancets lancets 1 each by Other route daily.   acetaminophen (TYLENOL) 500 MG tablet Take 1,000 mg by mouth every 6 (six) hours as needed for moderate pain or headache.   aspirin EC 81 MG tablet Take 1 tablet (81 mg total) by mouth daily. Swallow whole.   Blood Glucose Monitoring Suppl (ACCU-CHEK GUIDE ME) w/Device KIT Use as directed   Calcium Carb-Cholecalciferol (CALCIUM 600 + D PO) Take 2 tablets by mouth daily.   cetirizine (ZYRTEC) 10  MG tablet Take 10 mg by mouth daily.   chlorthalidone (HYGROTON) 50 MG tablet TAKE 1 TABLET BY MOUTH DAILY   diltiazem (CARDIZEM) 30 MG tablet Take 1 tablet (30 mg total) by mouth every 6 (six) hours as needed (If your heart rate is greater than 100.).   famotidine (PEPCID) 40 MG tablet TAKE 1 TABLET BY MOUTH AT  BEDTIME   fluticasone (CUTIVATE) 0.05 % cream Apply topically 2 (two) times daily. For 10 days to rectum   fluticasone (FLONASE) 50 MCG/ACT nasal spray Place 1 spray into the nose daily as needed for allergies.   glucose blood (ACCU-CHEK GUIDE) test strip 1 each by Other route daily in the afternoon.   hydrocortisone (ANUSOL-HC) 25 MG suppository Place 1 suppository (25 mg total) rectally 2 (two) times daily.   linaclotide (LINZESS) 145 MCG CAPS capsule Take 1 capsule (145 mcg total) by mouth daily before breakfast.   Melatonin 5 MG CAPS Take 5 mg by mouth at bedtime.   Multiple Vitamin (MULTIVITAMIN WITH MINERALS) TABS tablet Take 1 tablet by mouth daily.   omeprazole (PRILOSEC) 40 MG capsule TAKE 1 CAPSULE BY MOUTH TWICE  DAILY BEFORE MEALS   pioglitazone (ACTOS) 15 MG tablet Take 1 tablet (15 mg total) by mouth daily.   potassium chloride SA (KLOR-CON M) 20 MEQ tablet TAKE 2 TABLETS BY MOUTH TWICE  DAILY   pravastatin (PRAVACHOL) 40 MG tablet TAKE 1 TABLET BY MOUTH AT  BEDTIME   PREMARIN vaginal cream APPLY TWICE A DAY X 2 WEEKS, THEN TWICE A WEEK.   sulfamethoxazole-trimethoprim (BACTRIM  DS) 800-160 MG tablet Take 1 tablet by mouth 2 (two) times daily.   traZODone (DESYREL) 100 MG tablet TAKE 1 TABLET BY MOUTH BEFORE  BEDTIME   valsartan (DIOVAN) 320 MG tablet TAKE 1 TABLET BY MOUTH DAILY   No facility-administered encounter medications on file as of 08/06/2022.    Recent Relevant Labs: Lab Results  Component Value Date/Time   HGBA1C 7.0 (H) 06/28/2022 10:56 AM   HGBA1C 7.8 (H) 03/28/2022 11:52 AM   MICROALBUR 10 12/13/2020 10:44 AM   MICROALBUR 10 05/09/2020 11:32 AM     Kidney Function Lab Results  Component Value Date/Time   CREATININE 0.86 06/28/2022 10:56 AM   CREATININE 0.84 03/28/2022 11:52 AM   GFRNONAA >60 08/13/2020 05:29 AM   GFRAA 84 05/09/2020 11:32 AM    Star Rating Drugs:  Medication:  Last Fill: Day Supply Pioglitazone   05/28/22 90ds  Care Gaps: Annual wellness visit in last year? Yes Last eye exam / retinopathy screening:03/21/21 Last diabetic foot exam:06/28/22   Roxana Hires, CMA Clinical Pharmacist Assistant  [phone]

## 2022-08-07 DIAGNOSIS — N200 Calculus of kidney: Secondary | ICD-10-CM | POA: Diagnosis not present

## 2022-08-07 DIAGNOSIS — K5730 Diverticulosis of large intestine without perforation or abscess without bleeding: Secondary | ICD-10-CM | POA: Diagnosis not present

## 2022-08-07 DIAGNOSIS — K3189 Other diseases of stomach and duodenum: Secondary | ICD-10-CM | POA: Diagnosis not present

## 2022-08-07 DIAGNOSIS — R1084 Generalized abdominal pain: Secondary | ICD-10-CM | POA: Diagnosis not present

## 2022-08-08 IMAGING — CT CT HEART MORP W/ CTA COR W/ SCORE W/ CA W/CM &/OR W/O CM
2 of 7 series · 11 of 20 positions shown, 13 images · IV contrast (APPLIED)
Comparison: No priors.
COMPARISON: No priors.

Addendum:
EXAM:
OVER-READ INTERPRETATION  CT CHEST

The following report is an over-read performed by radiologist Dr.
Anders Bo Forchhammer [REDACTED] on 07/31/2020. This
over-read does not include interpretation of cardiac or coronary
anatomy or pathology. The coronary calcium score/coronary CTA
interpretation by the cardiologist is attached.
CLINICAL DATA: Severe Aortic Stenosis.
Cardiac TAVR CT
TECHNIQUE: The patient was scanned on a Phillips Force scanner. A 110 kV
retrospective scan was triggered in the descending thoracic aorta at
111 HU's. Gantry rotation speed was 250 msecs and collimation was .6
mm. No beta blockade or nitro were given. The 3D data set was
reconstructed in 5% intervals of the R-R cycle. Systolic and
diastolic phases were analyzed on a dedicated work station using
MPR, MIP and VRT modes. The patient received 80 cc of contrast.

[Series 8: 0-90% · axial · 0.39mm/px · z∈[+1218,+1347]mm · 5 of 3250 slices shown]
[im 542/3250  vessel]
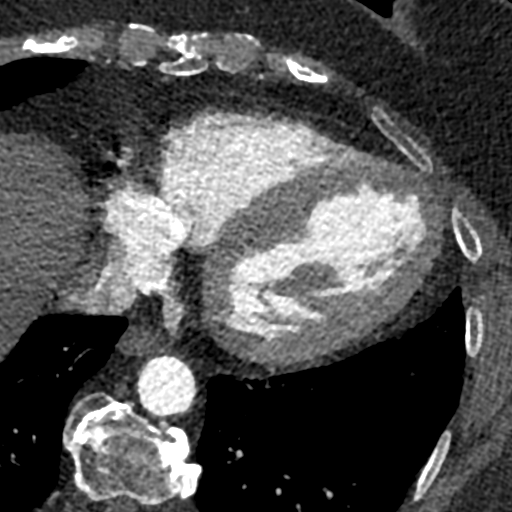
[im 1084/3250  vessel]
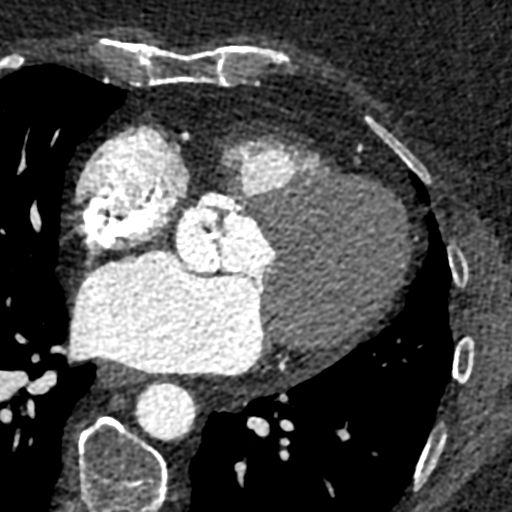
[im 1625/3250  vessel]
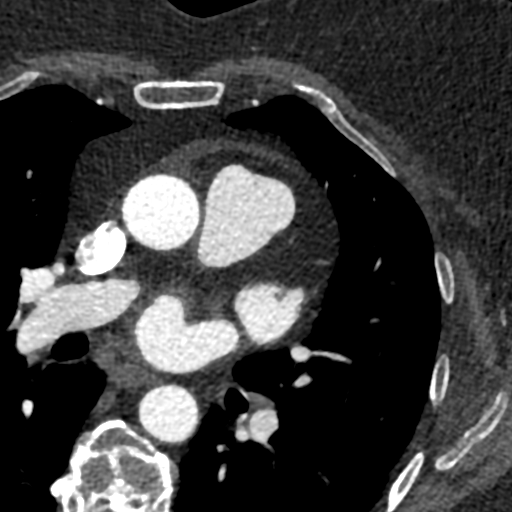
[im 2167/3250  vessel]
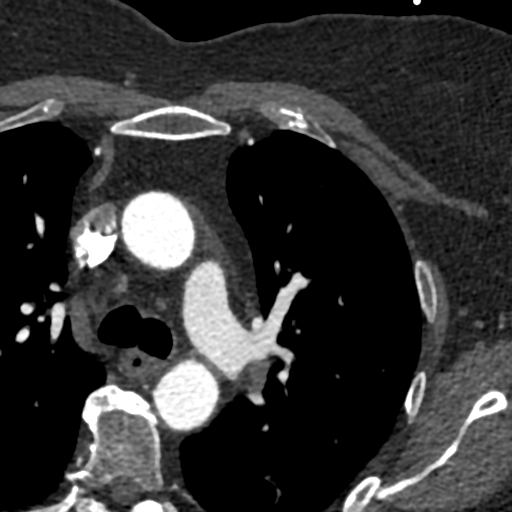
[im 2708/3250  vessel]
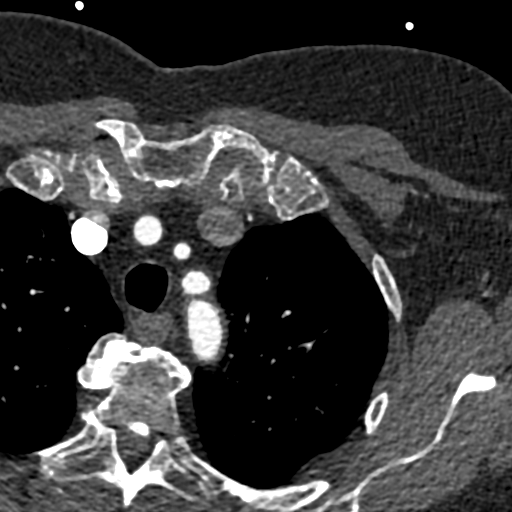

[Series 9: 5-95% · axial · 0.39mm/px · z∈[+1213,+1352]mm · 6 of 3250 slices shown, 8 images]
[im 465/3250  vessel]
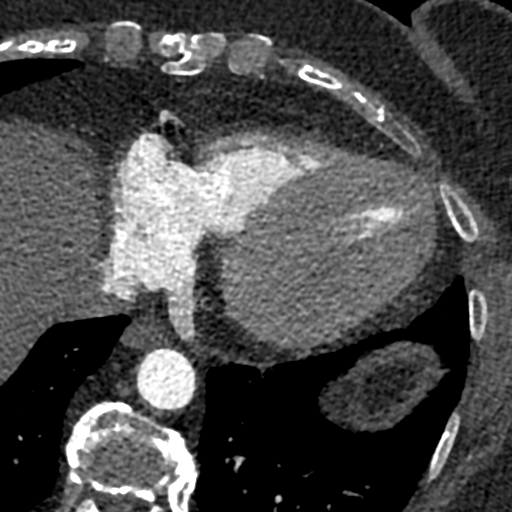
[im 465/3250  lung]
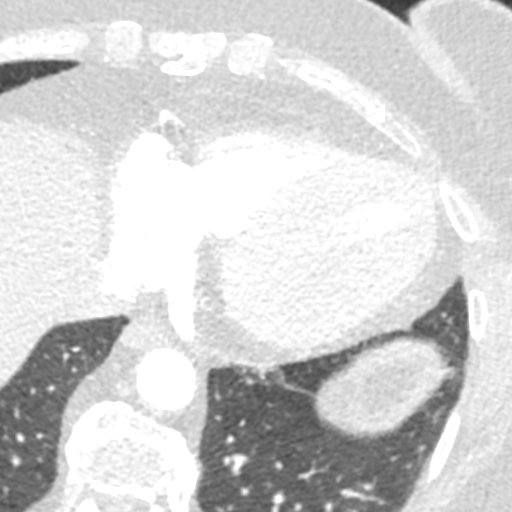
[im 929/3250  vessel]
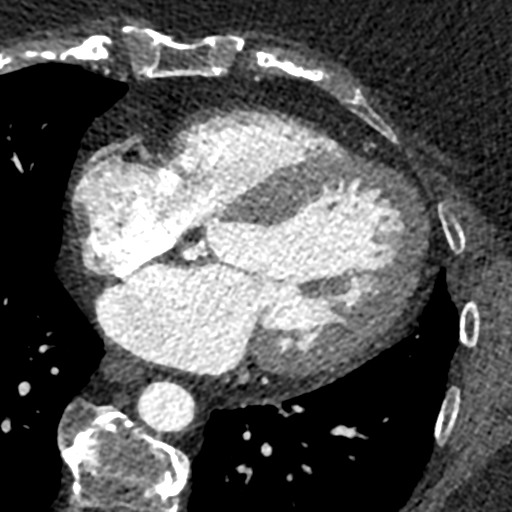
[im 1393/3250  vessel]
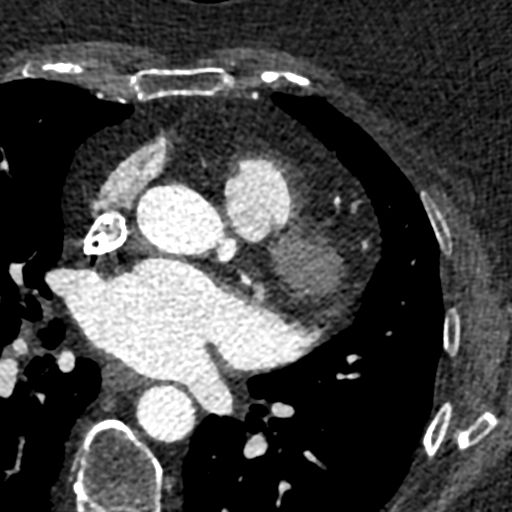
[im 1857/3250  vessel]
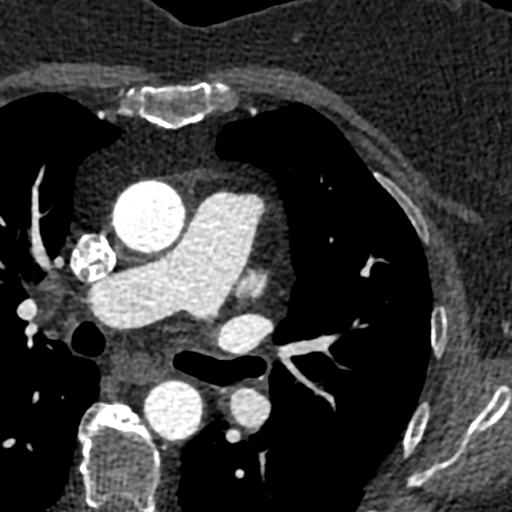
[im 2321/3250  vessel]
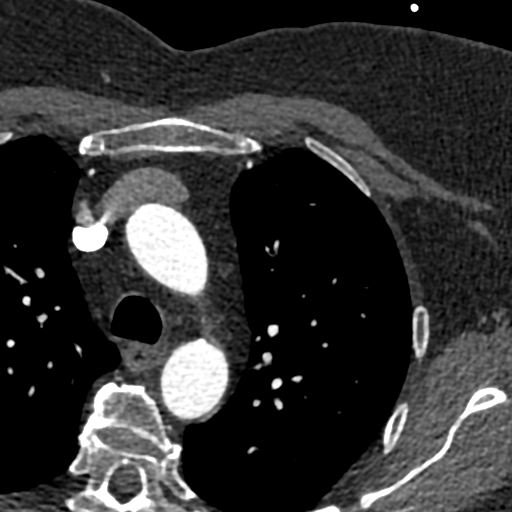
[im 2321/3250  lung]
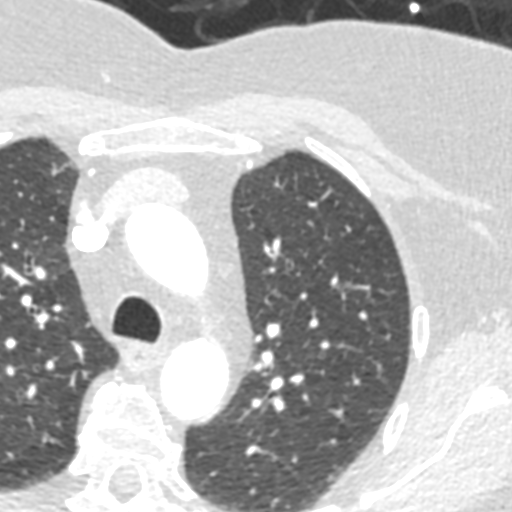
[im 2785/3250  vessel]
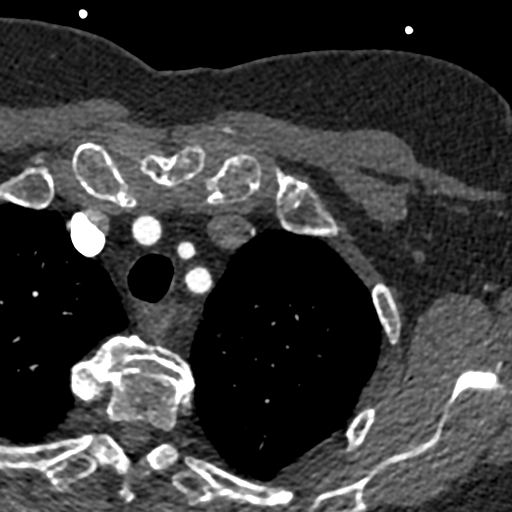

[11 of 20 positions shown; findings below may reference images not displayed]

FINDINGS: Extracardiac findings will be described separately under dictation
for contemporaneously obtained CTA chest, abdomen and pelvis.
IMPRESSION: Please see separate dictation for contemporaneously obtained CTA
chest, abdomen and pelvis dated 07/31/2020 for full description of
relevant extracardiac findings.
FINDINGS: Image quality: Excellent.

Noise artifact is: Limited.

Valve Morphology: The aortic valve is bicuspid with fusion of the
RCC/LCC with raphe (Raymond type 1). The leaflets demonstrate
restricted movement in systole. The leaflets are severely calcified
with bulky calcification of the raphe and NCC.

Aortic Valve Calcium score: 0363

Aortic annular dimension:

Phase assessed: 10%

Annular area: 512 mm2

Annular perimeter: 82.5 mm

Max diameter: 30.1 mm

Min diameter: 22.0 mm

Annular and subannular calcification: None.

Optimal coplanar projection: LAO 6 CENSI 5

Coronary Artery Height above Annulus:

Left Main: 12.2 mm

Right Coronary: 18.8 mm

Sinus of Valsalva Measurements:

Non-coronary: 33 mm

Right-coronary: 31 mm

Left-coronary: 31 mm

Sinus of Valsalva Height:

Non-coronary: 22.9 mm

Right-coronary: 18.8 mm

Left-coronary: 17.6 mm

Sinotubular Junction: 26 mm

Ascending Thoracic Aorta: 28 mm

Coronary Arteries: Normal coronary origin. Right dominance. The
study was performed without use of NTG and is insufficient for
plaque evaluation. Please refer to recent cardiac catheterization
for coronary assessment. No significant CAD.

Cardiac Morphology:

Right Atrium: Right atrial size is within normal limits.

Right Ventricle: The right ventricular cavity is within normal
limits.

Left Atrium: Left atrial size is normal in size with no left atrial
appendage filling defect. A small PFO is present.

Left Ventricle: The ventricular cavity size is within normal limits.
There are no stigmata of prior infarction. There is no abnormal
filling defect. Normal LV function, LVEF=69%.

Pulmonary arteries: Normal in size without proximal filling defect.

Pulmonary veins: Normal pulmonary venous drainage.

Pericardium: Normal thickness with no significant effusion or
calcium present.

Mitral Valve: The mitral valve is normal structure without
significant calcification.

Extra-cardiac findings: See attached radiology report for
non-cardiac structures.
IMPRESSION: 1. Bicuspid aortic valve with fusion of the RCC/LCC with raphe.
Severe aortic stenosis (calcium score 0363). Severe bulky
calcifications of the NCC and raphe.

2. Annular measurements appropriate for 26 mm S3 TAVR (512 mm2).

3. No significant annular or subannular calcifications.

4. Sufficient coronary to annulus distance.

5. Optimal Fluoroscopic Angle for Delivery: LAO 6 CENSI 5

6. Small PFO.

*** End of Addendum ***
EXAM:
OVER-READ INTERPRETATION  CT CHEST

The following report is an over-read performed by radiologist Dr.
Anders Bo Forchhammer [REDACTED] on 07/31/2020. This
over-read does not include interpretation of cardiac or coronary
anatomy or pathology. The coronary calcium score/coronary CTA
interpretation by the cardiologist is attached.
FINDINGS: Extracardiac findings will be described separately under dictation
for contemporaneously obtained CTA chest, abdomen and pelvis.
IMPRESSION: Please see separate dictation for contemporaneously obtained CTA
chest, abdomen and pelvis dated 07/31/2020 for full description of
relevant extracardiac findings.

## 2022-08-08 IMAGING — CT CT ANGIO CHEST
2 of 7 series · 16 of 36 positions shown · IV contrast (omnipaque)
Comparison: No priors.

CLINICAL DATA: 70-year-old female with history of severe aortic
stenosis. Preprocedural study prior to potential transcatheter
aortic valve replacement (TAVR) procedure.

EXAM:
CT ANGIOGRAPHY CHEST, ABDOMEN AND PELVIS
TECHNIQUE: Multidetector CT imaging through the chest, abdomen and pelvis was
performed using the standard protocol during bolus administration of
intravenous contrast. Multiplanar reconstructed images and MIPs were
obtained and reviewed to evaluate the vascular anatomy.
CONTRAST:  100mL OMNIPAQUE IOHEXOL 350 MG/ML SOLN

[Series 4: ax thins · axial · 0.59mm/px · z∈[+824,+1446]mm · 15 of 700 slices shown]
[im 39/700  lung]
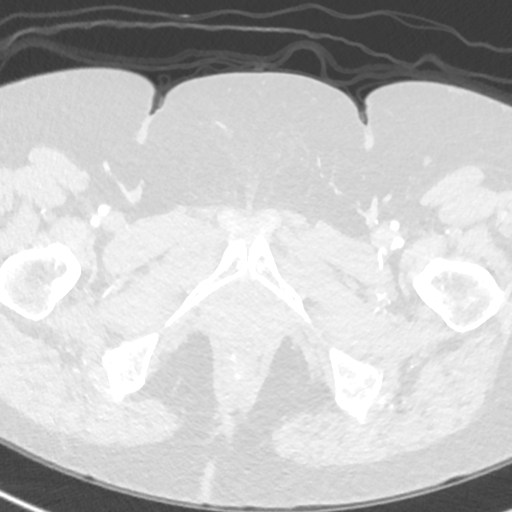
[im 78/700  mediastinal]
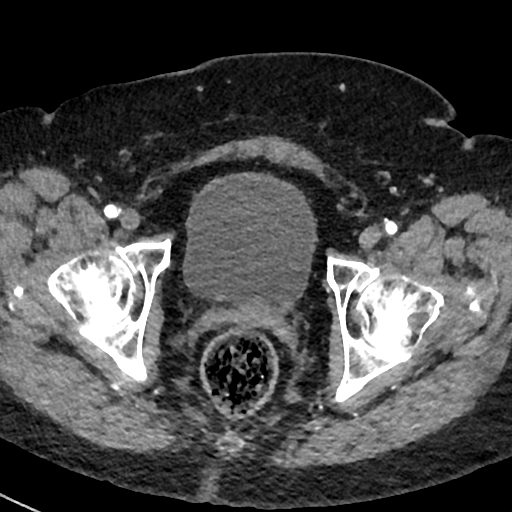
[im 117/700  lung]
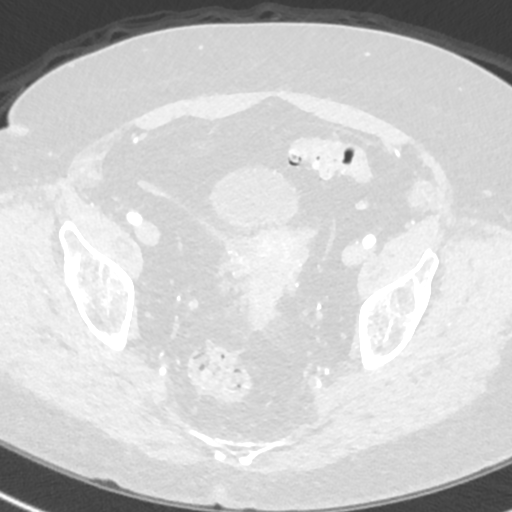
[im 156/700  mediastinal]
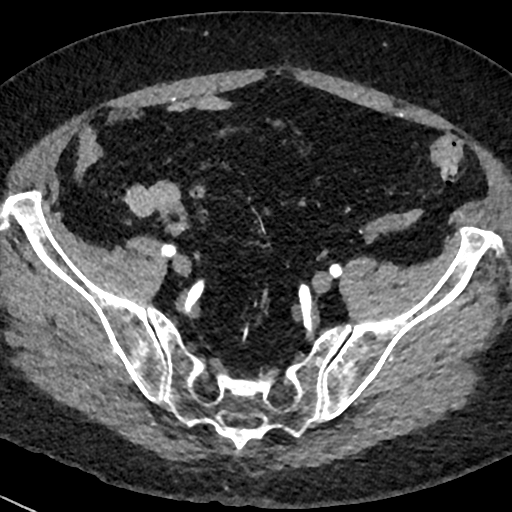
[im 234/700  lung]
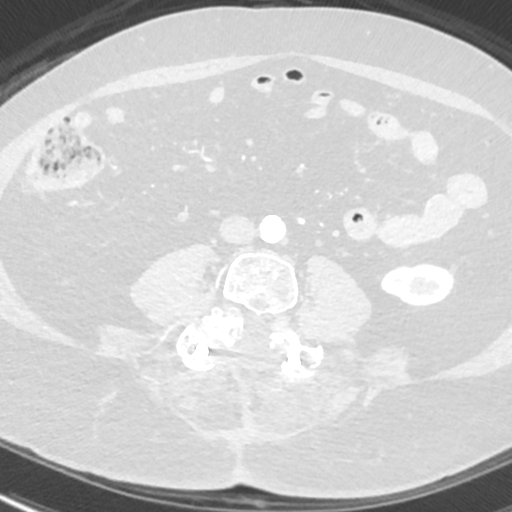
[im 272/700  mediastinal]
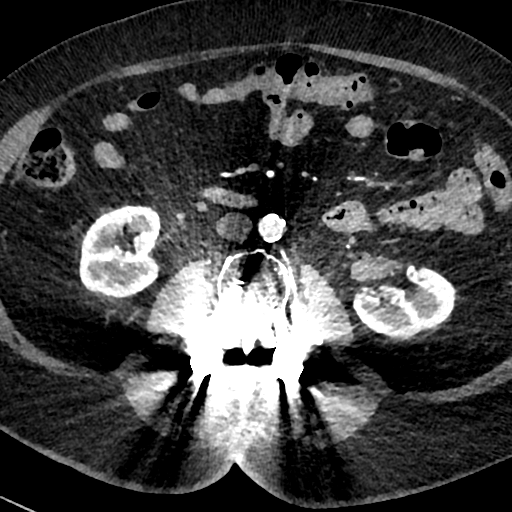
[im 311/700  lung]
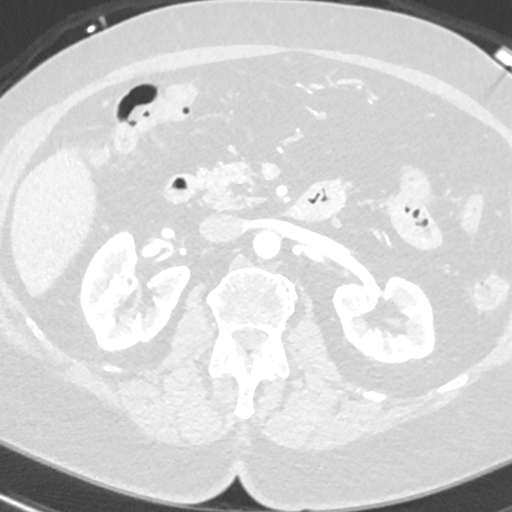
[im 350/700  mediastinal]
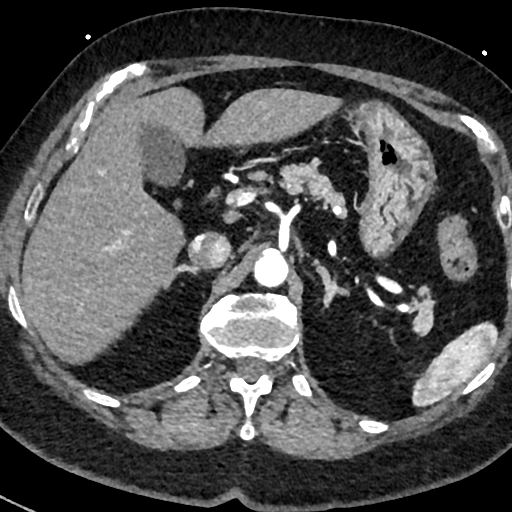
[im 389/700  lung]
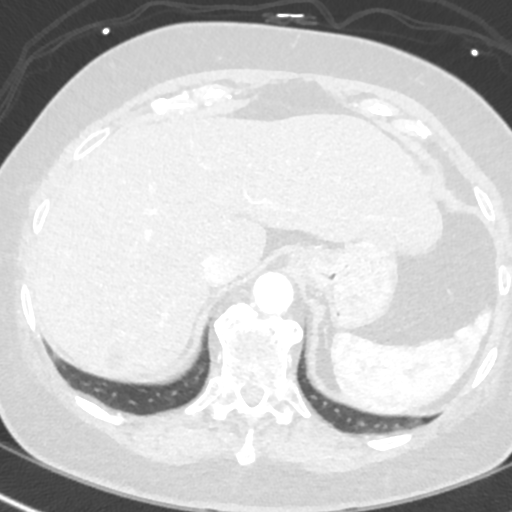
[im 428/700  mediastinal]
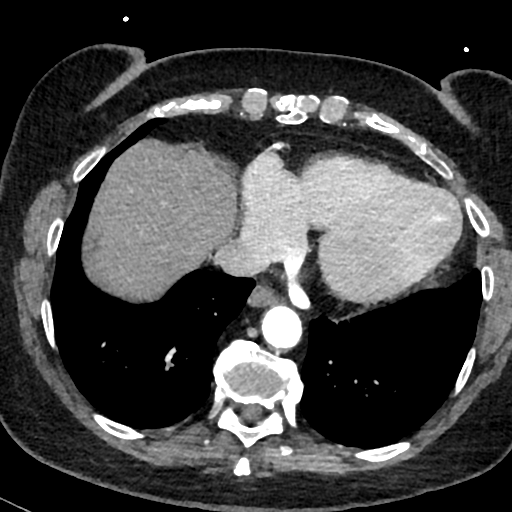
[im 467/700  lung]
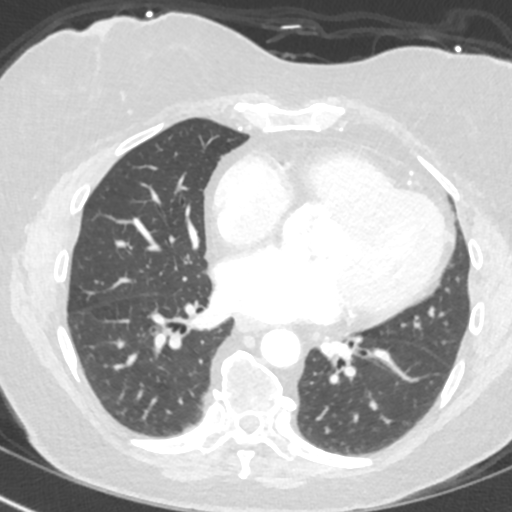
[im 544/700  mediastinal]
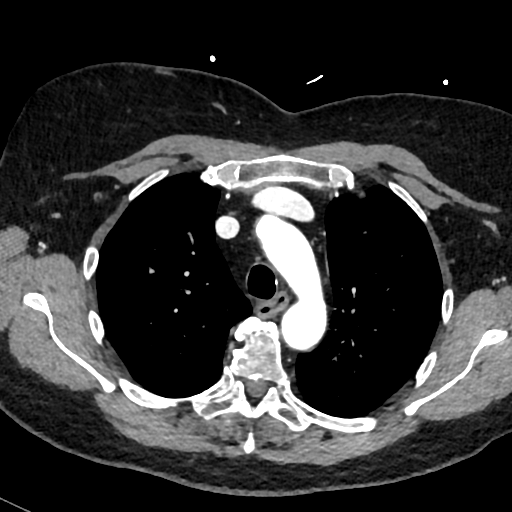
[im 583/700  lung]
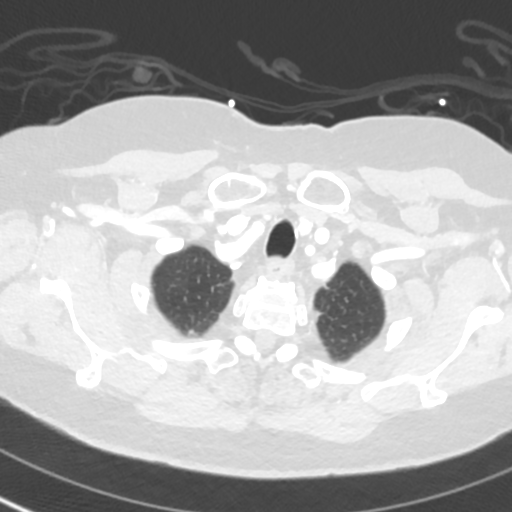
[im 622/700  mediastinal]
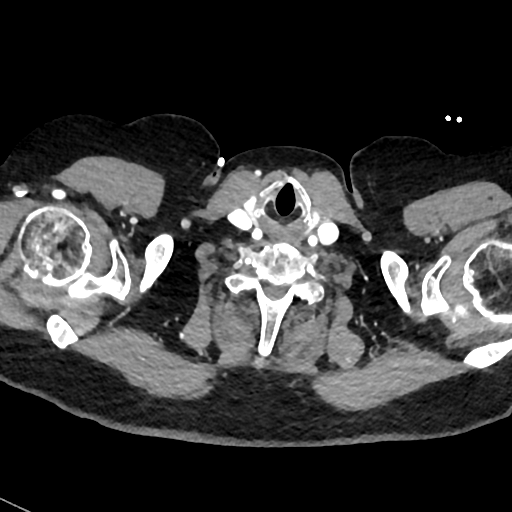
[im 661/700  lung]
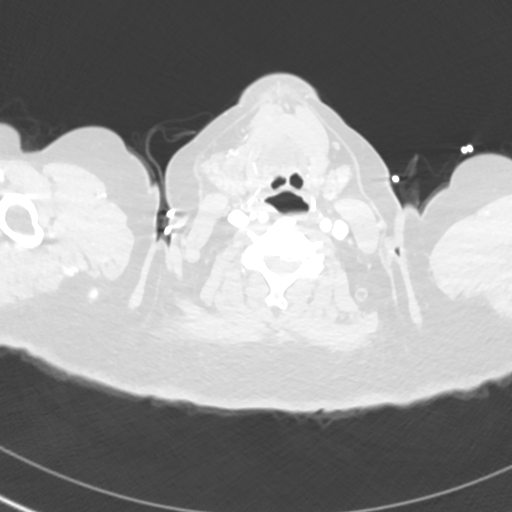

[Series 7: cor · coronal · 0.85mm/px · 1 of 126 slices shown]
[im 63/126  mediastinal]
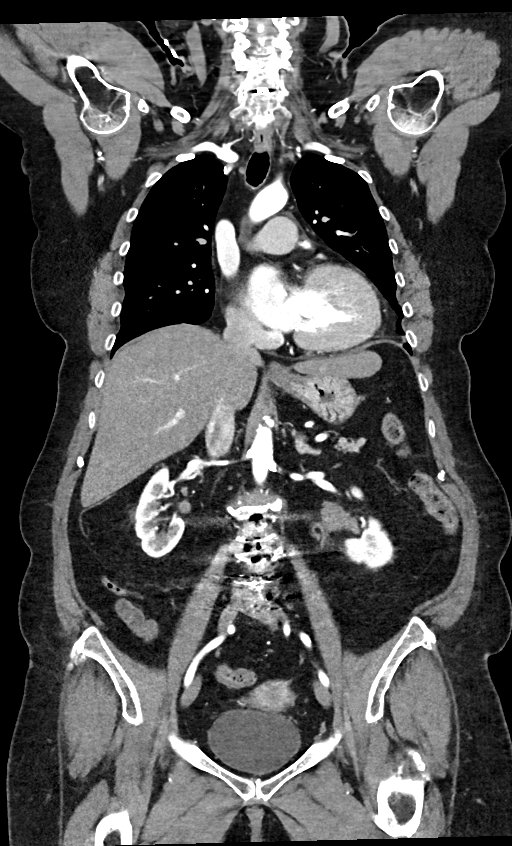

[16 of 36 positions shown; findings below may reference images not displayed]

FINDINGS: CTA CHEST FINDINGS

Cardiovascular: Heart size is normal. There is no significant
pericardial fluid, thickening or pericardial calcification. Aortic
atherosclerosis. No definite coronary artery calcifications. Severe
thickening and calcification of the aortic valve.

Mediastinum/Lymph Nodes: No pathologically enlarged mediastinal or
hilar lymph nodes. Esophagus is unremarkable in appearance. No
axillary lymphadenopathy.

Lungs/Pleura: No suspicious appearing pulmonary nodules or masses
are noted. No acute consolidative airspace disease. No pleural
effusions.

Musculoskeletal/Soft Tissues: There are no aggressive appearing
lytic or blastic lesions noted in the visualized portions of the
skeleton.

CTA ABDOMEN AND PELVIS FINDINGS

Hepatobiliary: In segment 7 of the liver, compatible 1.9 cm
low-attenuation lesion with a simple cyst. No other suspicious
hepatic lesions. No intra or extrahepatic biliary ductal dilatation.
Gallbladder is normal in appearance.

Pancreas: No pancreatic mass. No pancreatic ductal dilatation. No
pancreatic or peripancreatic fluid collections or inflammatory
changes.

Spleen: Unremarkable.

Adrenals/Urinary Tract: 4 mm nonobstructive calculus in the upper
pole collecting system of the left kidney. Bilateral kidneys and
adrenal glands are otherwise normal in appearance. No
hydroureteronephrosis. Urinary bladder is normal in appearance.

Stomach/Bowel: Normal appearance of the stomach. No pathologic
dilatation of small bowel or colon. Several colonic diverticulae are
noted, without surrounding inflammatory changes to suggest an acute
diverticulitis at this time. Normal appendix.

Vascular/Lymphatic: Aortic atherosclerosis, without evidence of
aneurysm or dissection in the abdominal or pelvic vasculature.
Vascular findings and measurements pertinent to potential TAVR
procedure, as detailed below. No lymphadenopathy noted in the
abdomen or pelvis.

Reproductive: Uterus and ovaries are unremarkable in appearance.

Other: No significant volume of ascites.  No pneumoperitoneum.

Musculoskeletal: Status post PLIF from L3-S1, with interbody grafts
at L3-L4, L4-L5 and L5-S1. There are no aggressive appearing lytic
or blastic lesions noted in the visualized portions of the skeleton.

VASCULAR MEASUREMENTS PERTINENT TO TAVR:

AORTA:

Minimal Aortic Iiameter-IT x 12 mm

Severity of Aortic Calcification-severe

RIGHT PELVIS:

Right Common Iliac Artery -

Minimal Hiameter-U.U x 7.9 mm

Tortuosity-mild

Calcification-none

Right External Iliac Artery -

Minimal Uiameter-X.0 x 6.7 mm

Tortuosity - mild

Calcification-none

Right Common Femoral Artery -

Minimal 1iameter-H.A x 7.0 mm

Tortuosity - mild

Calcification-minimal

LEFT PELVIS:

Left Common Iliac Artery -

Minimal 7iameter-V.P x 7.7 mm

Tortuosity - mild

Calcification-none

Left External Iliac Artery -

Minimal 2iameter-N.8 x 6.4 mm

Tortuosity - mild

Calcification-none

Left Common Femoral Artery -

Minimal 2iameter-N.8 x 6.1 mm

Tortuosity - mild

Calcification-none

Review of the MIP images confirms the above findings.
IMPRESSION: 1. Vascular findings and measurements pertinent to potential TAVR
procedure, as detailed above.
2. Severe thickening calcification of the aortic valve, compatible
with reported clinical history of severe aortic stenosis.
3.  Aortic Atherosclerosis ([id number].G).
4. 4 mm nonobstructive calculus upper pole collecting system of left
kidney.
5. Colonic diverticulosis without evidence of acute diverticulitis
at this time.
6. Additional incidental findings, as above.

## 2022-08-19 ENCOUNTER — Other Ambulatory Visit: Payer: Self-pay

## 2022-08-19 DIAGNOSIS — E1159 Type 2 diabetes mellitus with other circulatory complications: Secondary | ICD-10-CM

## 2022-08-19 IMAGING — DX DG CHEST 1V PORT
1 series · 1 of 1 positions shown · non-contrast
Comparison: 08/10/2020

CLINICAL DATA: Respiratory distress

EXAM:
PORTABLE CHEST 1 VIEW

[chest]
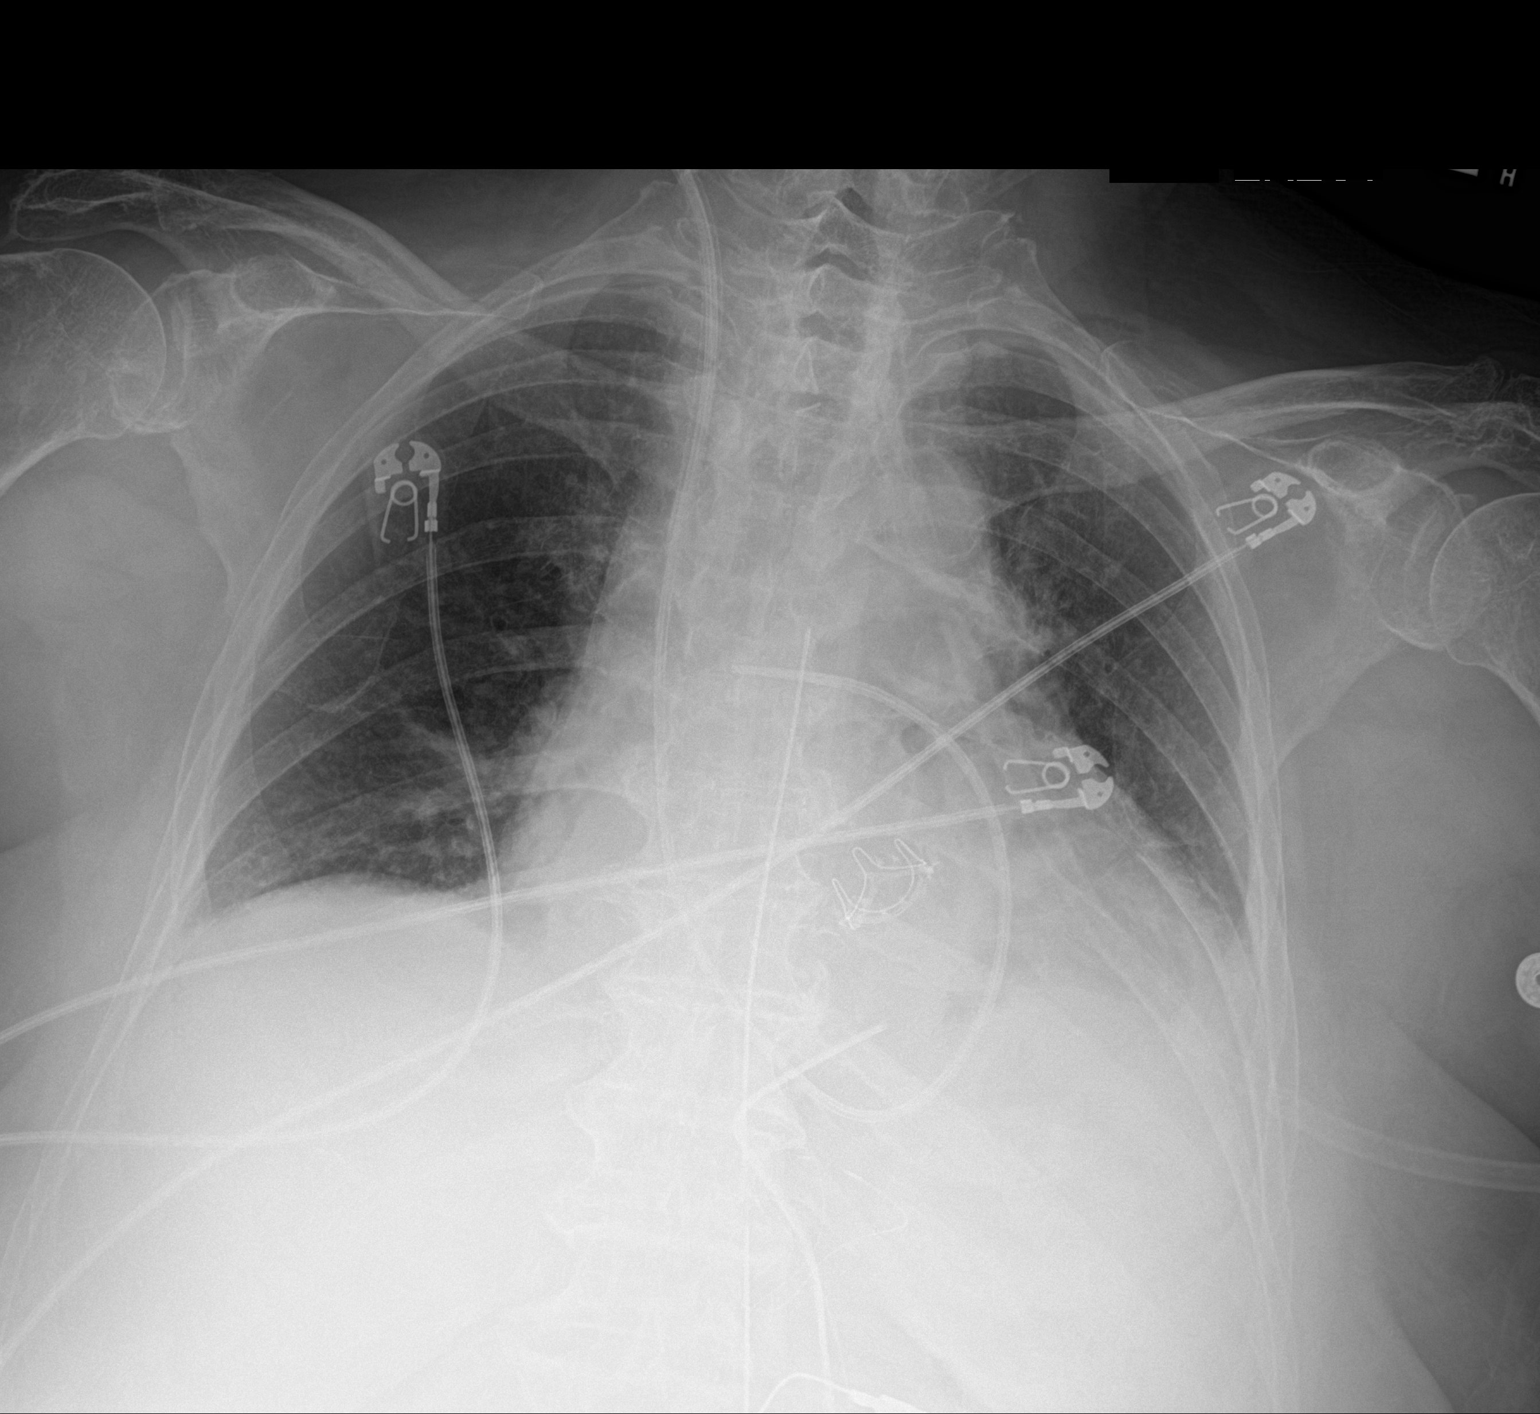

[1 of 1 positions shown; findings below may reference images not displayed]

FINDINGS: Interval extubation. Lung volumes are small, however, pulmonary
insufflation appears stable since prior examination. Right internal
jugular Swan-Ganz catheter with its tip within the right pulmonary
artery, mediastinal drain, and left basilar chest tube are
unchanged. Left basilar atelectasis and tiny left pleural effusion
are again noted. No pneumothorax. Cardiac size is within normal
limits. Aortic valve replacement has been performed. Pulmonary
vascularity is normal.
IMPRESSION: Interval extubation with stable pulmonary hypoinflation.

Stable left basilar atelectasis and tiny pleural effusion.

Left chest tube in place.  No pneumothorax.

Swan-Ganz catheter tip within the right pulmonary artery.

## 2022-08-19 MED ORDER — PIOGLITAZONE HCL 15 MG PO TABS: 15.0000 mg | ORAL_TABLET | Freq: Every day | ORAL | 0 refills | Status: DC

## 2022-08-20 IMAGING — DX DG CHEST 1V PORT
1 series · 1 of 1 positions shown · non-contrast
Comparison: 08/11/2020

CLINICAL DATA: Atelectasis

Diabetes
Hypertension
EXAM:
PORTABLE CHEST 1 VIEW

[chest]
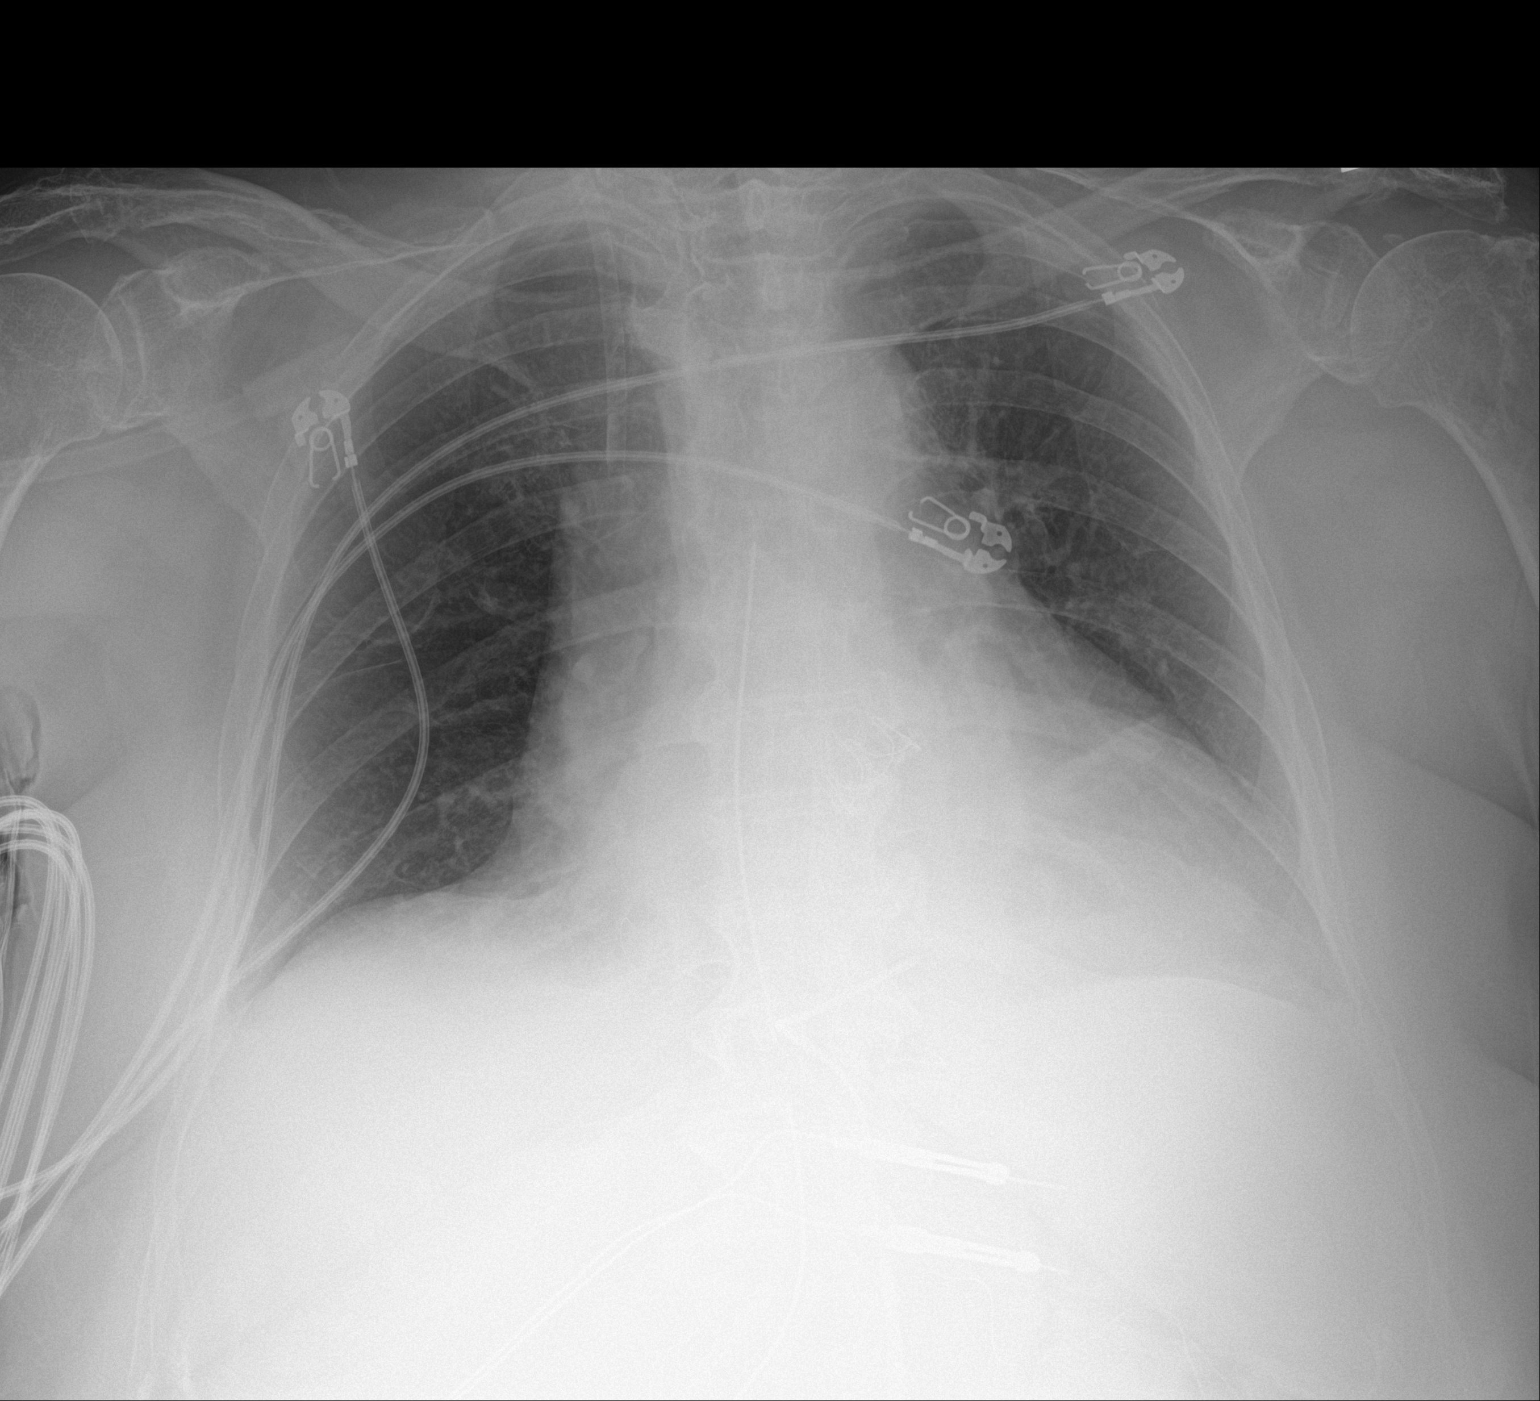

[1 of 1 positions shown; findings below may reference images not displayed]

FINDINGS: interval removal of Swan-Ganz catheter. Right IJ sheath still
remains in place. Mediastinal drains again seen. Prosthetic aortic
valve again identified. ACDF changes lower cervical spine partially
visualized.

Unchanged mild cardiomegaly without significant pulmonary vascular
congestion. Mild left basilar atelectasis similar to prior exam.
Lungs otherwise clear.
IMPRESSION: Mild left basilar atelectasis, similar to prior study.

## 2022-08-22 ENCOUNTER — Other Ambulatory Visit: Payer: Self-pay | Admitting: Family Medicine

## 2022-08-23 DIAGNOSIS — R070 Pain in throat: Secondary | ICD-10-CM | POA: Diagnosis not present

## 2022-08-23 DIAGNOSIS — J324 Chronic pansinusitis: Secondary | ICD-10-CM | POA: Diagnosis not present

## 2022-08-23 DIAGNOSIS — R519 Headache, unspecified: Secondary | ICD-10-CM | POA: Diagnosis not present

## 2022-08-27 ENCOUNTER — Telehealth: Payer: Self-pay

## 2022-08-27 ENCOUNTER — Ambulatory Visit (INDEPENDENT_AMBULATORY_CARE_PROVIDER_SITE_OTHER): Payer: Medicare Other | Admitting: Physician Assistant

## 2022-08-27 VITALS — BP 132/70 | HR 80 | Temp 97.3°F | Ht 66.0 in | Wt 206.0 lb

## 2022-08-27 DIAGNOSIS — J309 Allergic rhinitis, unspecified: Secondary | ICD-10-CM | POA: Insufficient documentation

## 2022-08-27 HISTORY — DX: Allergic rhinitis, unspecified: J30.9

## 2022-08-27 NOTE — Telephone Encounter (Signed)
Patient informed and scheduled.

## 2022-08-27 NOTE — Progress Notes (Unsigned)
Acute Office Visit  Subjective:    Patient ID: Megan Galvan, female    DOB: [birth year], 72 y.o.   MRN: [id number]  Chief Complaint  Patient presents with   URI    HPI: Patient is in today for URI symptoms seen at [facility] Friday. States ears hurt, sore throat, congestion, dry cough mostly, facial pain and pressure. Denies fever, chills or body aches. UC rxd a z-pak and azelastine nasal spray and promethazine cough syrup and prednisone 20 mg x 5. Reports no improvement.   Patient admits to having seasonal allergies and has taken zyrtec and gotten good relief from symptoms. Patient states she finished the z-pack and that the other medications have not helped at all. Patient states she has taken benadryl before to help with allergy symptoms but has not taken them for a few years.  Past Medical History:  Diagnosis Date   Allergy    Arthritis    Cataract    Diabetes mellitus without complication (HCC)    Nonrheumatic aortic (valve) stenosis    Nonrheumatic aortic (valve) stenosis    Plantar fasciitis    S/P aortic valve replacement with bioprosthetic valve 08/10/2020   21 mm Edwards Resilia Inspiria Bioprosthetic Tissue Valve  SN [id number] Model [id number]   Sleep apnea    cpap    Past Surgical History:  Procedure Laterality Date   ANTERIOR CERVICAL DECOMP/DISCECTOMY FUSION     x 2   AORTIC VALVE REPLACEMENT N/A 08/10/2020   Procedure: AORTIC VALVE REPLACEMENT (AVR) USING INSPIRIS VALVE SIZE ;  Surgeon: Purcell Nails, MD;  Location: [facility] OR;  Service: Open Heart Surgery;  Laterality: N/A;   BACK SURGERY  11/17/2016   L3-L5   BILATERAL CARPAL TUNNEL RELEASE     bone spur Left    left shoulder   bone spur Right    Right shoulder   CATARACT EXTRACTION Left 09/18/2021   COLONOSCOPY  08/22/2011   Moderate predominantly sigmoid diverticulosis. Small internal hemorrhoids.    COLONOSCOPY     HEMORROIDECTOMY  02/2019   NECK SURGERY     RIGHT/LEFT HEART CATH AND CORONARY ANGIOGRAPHY  N/A 07/27/2020   Procedure: RIGHT/LEFT HEART CATH AND CORONARY ANGIOGRAPHY;  Surgeon: Tonny Bollman, MD;  Location: [facility] INVASIVE CV LAB;  Service: Cardiovascular;  Laterality: N/A;   TEE WITHOUT CARDIOVERSION N/A 08/10/2020   Procedure: TRANSESOPHAGEAL ECHOCARDIOGRAM (TEE);  Surgeon: Purcell Nails, MD;  Location: [facility] OR;  Service: Open Heart Surgery;  Laterality: N/A;   torn rotator cuff Right    Right shoulder   torn rotator cuff Left    left shoulder   TRIGGER FINGER RELEASE Left    thumb   TRIGGER FINGER RELEASE Right    thumb and middle finger   UPPER GASTROINTESTINAL ENDOSCOPY     WRIST SURGERY Left    torn ligament   WRIST SURGERY Right    cyst    Family History  Problem Relation Age of Onset   Diabetes Mother    Colon cancer Paternal Grandmother    Esophageal cancer Neg Hx    Rectal cancer Neg Hx    Stomach cancer Neg Hx    Breast cancer Neg Hx     Social History   Socioeconomic History   Marital status: Married    Spouse name: Eddie   Number of children: 2   Years of education: Not on file   Highest education level: Not on file  Occupational History   Not on file  Tobacco  Use   Smoking status: Never   Smokeless tobacco: Never  Vaping Use   Vaping Use: Never used  Substance and Sexual Activity   Alcohol use: Never   Drug use: Never   Sexual activity: Not on file  Other Topics Concern   Not on file  Social History Narrative   Not on file   Social Determinants of Health   Financial Resource Strain: High Risk (12/05/2021)   Overall Financial Resource Strain (CARDIA)    Difficulty of Paying Living Expenses: Hard  Food Insecurity: No Food Insecurity (09/30/2019)   Hunger Vital Sign    Worried About Running Out of Food in the Last Year: Never true    Ran Out of Food in the Last Year: Never true  Transportation Needs: No Transportation Needs (12/05/2021)   PRAPARE - Administrator, Civil Service (Medical): No    Lack of Transportation  (Non-Medical): No  Physical Activity: Inactive (06/17/2022)   Exercise Vital Sign    Days of Exercise per Week: 0 days    Minutes of Exercise per Session: 0 min  Stress: No Stress Concern Present (06/17/2022)   [employer] of Occupational Health - Occupational Stress Questionnaire    Feeling of Stress : Not at all  Social Connections: Moderately Integrated (06/17/2022)   Social Connection and Isolation Panel [NHANES]    Frequency of Communication with Friends and Family: More than three times a week    Frequency of Social Gatherings with Friends and Family: More than three times a week    Attends Religious Services: More than 4 times per year    Active Member of [employer] or Organizations: No    Attends Banker Meetings: Never    Marital Status: Married  Catering manager Violence: Not At Risk (09/17/2021)   Humiliation, Afraid, Rape, and Kick questionnaire    Fear of Current or Ex-Partner: No    Emotionally Abused: No    Physically Abused: No    Sexually Abused: No    Outpatient Medications Prior to Visit  Medication Sig Dispense Refill   Accu-Chek Softclix Lancets lancets CHECK BLOOD SUGAR DAILY 100 each 2   acetaminophen (TYLENOL) 500 MG tablet Take 1,000 mg by mouth every 6 (six) hours as needed for moderate pain or headache.     aspirin EC 81 MG tablet Take 1 tablet (81 mg total) by mouth daily. Swallow whole. 90 tablet 3   Blood Glucose Monitoring Suppl (ACCU-CHEK GUIDE ME) w/Device KIT Use as directed 1 kit 0   Calcium Carb-Cholecalciferol (CALCIUM 600 + D PO) Take 2 tablets by mouth daily.     cetirizine (ZYRTEC) 10 MG tablet Take 10 mg by mouth daily.     chlorthalidone (HYGROTON) 50 MG tablet TAKE 1 TABLET BY MOUTH DAILY 100 tablet 2   diltiazem (CARDIZEM) 30 MG tablet Take 1 tablet (30 mg total) by mouth every 6 (six) hours as needed (If your heart rate is greater than 100.). 30 tablet 3   famotidine (PEPCID) 40 MG tablet TAKE 1 TABLET BY MOUTH AT  BEDTIME 100  tablet 1   fluticasone (CUTIVATE) 0.05 % cream Apply topically 2 (two) times daily. For 10 days to rectum 30 g 2   fluticasone (FLONASE) 50 MCG/ACT nasal spray Place 1 spray into the nose daily as needed for allergies.     glucose blood (ACCU-CHEK GUIDE) test strip 1 each by Other route daily in the afternoon. 100 each 3   hydrocortisone (ANUSOL-HC) 25 MG  suppository Place 1 suppository (25 mg total) rectally 2 (two) times daily. 30 suppository 2   linaclotide (LINZESS) 145 MCG CAPS capsule Take 1 capsule (145 mcg total) by mouth daily before breakfast. 30 capsule 5   Melatonin 5 MG CAPS Take 5 mg by mouth at bedtime.     Multiple Vitamin (MULTIVITAMIN WITH MINERALS) TABS tablet Take 1 tablet by mouth daily.     omeprazole (PRILOSEC) 40 MG capsule TAKE 1 CAPSULE BY MOUTH TWICE  DAILY BEFORE MEALS 200 capsule 2   pioglitazone (ACTOS) 15 MG tablet Take 1 tablet (15 mg total) by mouth daily. 90 tablet 0   pravastatin (PRAVACHOL) 40 MG tablet TAKE 1 TABLET BY MOUTH AT  BEDTIME 100 tablet 2   PREMARIN vaginal cream APPLY TWICE A DAY X 2 WEEKS, THEN TWICE A WEEK. 42.5 g 1   sulfamethoxazole-trimethoprim (BACTRIM DS) 800-160 MG tablet Take 1 tablet by mouth 2 (two) times daily.     traZODone (DESYREL) 100 MG tablet TAKE 1 TABLET BY MOUTH BEFORE  BEDTIME 90 tablet 3   valsartan (DIOVAN) 320 MG tablet TAKE 1 TABLET BY MOUTH DAILY 100 tablet 2   potassium chloride SA (KLOR-CON M) 20 MEQ tablet TAKE 2 TABLETS BY MOUTH TWICE  DAILY 360 tablet 1   No facility-administered medications prior to visit.    Allergies  Allergen Reactions   Ace Inhibitors Cough   Farxiga [Dapagliflozin]     Yeast infections    Jardiance [Empagliflozin]     Yeast infections   Keflex [Cephalexin] Itching   Latex     Burn and itch   Metformin And Related Diarrhea   Nexium [Esomeprazole Magnesium] Diarrhea   Protonix [Pantoprazole Sodium] Other (See Comments)    Constipation   Doxycycline Rash    Review of Systems   Constitutional:  Negative for chills, fatigue and fever.  HENT:  Positive for congestion, ear pain, sinus pain and sore throat. Negative for postnasal drip, rhinorrhea and sinus pressure.   Respiratory:  Positive for cough. Negative for shortness of breath.   Cardiovascular:  Negative for chest pain.  Gastrointestinal:  Negative for diarrhea and nausea.  Neurological:  Positive for headaches. Negative for dizziness.       Objective:        08/27/2022    3:24 PM 06/28/2022   10:14 AM 06/17/2022    3:31 PM  Vitals with BMI  Height 5\' 6"  5\' 6"  5\' 6"   Weight 206 lbs 206 lbs 203 lbs  BMI 33.27 33.27 32.78  Systolic 132 118 161  Diastolic 70 80 62  Pulse 80 66 86    No data found.   Physical Exam Constitutional:      Appearance: Normal appearance.  HENT:     Right Ear: No middle ear effusion. Tympanic membrane is not bulging.     Left Ear: Ear canal normal. A middle ear effusion is present. Tympanic membrane is bulging.  Cardiovascular:     Rate and Rhythm: Normal rate and regular rhythm.     Heart sounds: Normal heart sounds.  Pulmonary:     Effort: Pulmonary effort is normal.     Breath sounds: Normal breath sounds.  Abdominal:     General: Bowel sounds are normal.     Palpations: Abdomen is soft.  Neurological:     Mental Status: She is alert and oriented to person, place, and time.  Psychiatric:        Behavior: Behavior normal.     Health  Maintenance Due  Topic Date Due   Hepatitis C Screening  Never done   Zoster Vaccines- Shingrix (1 of 2) Never done   OPHTHALMOLOGY EXAM  03/21/2022   Medicare Annual Wellness (AWV)  09/14/2022    There are no preventive care reminders to display for this patient.   Lab Results  Component Value Date   TSH 2.360 09/04/2021   Lab Results  Component Value Date   WBC 5.7 06/28/2022   HGB 12.3 06/28/2022   HCT 35.6 06/28/2022   MCV 98 (H) 06/28/2022   PLT 223 06/28/2022   Lab Results  Component Value Date   NA 139  06/28/2022   K 3.7 06/28/2022   CO2 27 06/28/2022   GLUCOSE 127 (H) 06/28/2022   BUN 15 06/28/2022   CREATININE 0.86 06/28/2022   BILITOT 0.4 06/28/2022   ALKPHOS 56 06/28/2022   AST 36 06/28/2022   ALT 29 06/28/2022   PROT 7.1 06/28/2022   ALBUMIN 4.5 06/28/2022   CALCIUM 9.7 06/28/2022   ANIONGAP 10 08/13/2020   EGFR 72 06/28/2022   Lab Results  Component Value Date   CHOL 113 06/28/2022   Lab Results  Component Value Date   HDL 45 06/28/2022   Lab Results  Component Value Date   LDLCALC 50 06/28/2022   Lab Results  Component Value Date   TRIG 95 06/28/2022   Lab Results  Component Value Date   CHOLHDL 2.5 06/28/2022   Lab Results  Component Value Date   HGBA1C 7.0 (H) 06/28/2022       Assessment & Plan:  Allergic sinusitis Assessment & Plan: Patient will take one Benadryl at night before bed and take her Zyrtec in the morning to assist with sinus congestion leading to cough and head ache. If there is no improvement by next week she will call to come see [place] again.      No orders of the defined types were placed in this encounter.   No orders of the defined types were placed in this encounter.    Follow-up: Return if symptoms worsen or fail to improve.  An After Visit Summary was printed and given to the patient.  Langley Gauss, [facility] [phone]

## 2022-08-27 NOTE — Telephone Encounter (Signed)
Patient called stating that she was wanting an appointment to come in to be seen, we have no current openings. Patient went to Urgent care on Friday due to no openings and they gave her some prednisone and they gave her cough syrup, and azithromycin. Patient is still having head and chest congestion and is needing to be seen. Please advise if you want patient scheduled and if so where.

## 2022-08-27 NOTE — Assessment & Plan Note (Signed)
Patient will take one Benadryl at night before bed and take her Zyrtec in the morning to assist with sinus congestion leading to cough and head ache. If there is no improvement by next week she will call to come see [place] again.

## 2022-09-07 ENCOUNTER — Other Ambulatory Visit: Payer: Self-pay | Admitting: Family Medicine

## 2022-09-10 ENCOUNTER — Encounter: Payer: Self-pay | Admitting: Physician Assistant

## 2022-09-10 DIAGNOSIS — R0981 Nasal congestion: Secondary | ICD-10-CM | POA: Diagnosis not present

## 2022-09-10 DIAGNOSIS — J019 Acute sinusitis, unspecified: Secondary | ICD-10-CM | POA: Diagnosis not present

## 2022-09-10 DIAGNOSIS — R051 Acute cough: Secondary | ICD-10-CM | POA: Diagnosis not present

## 2022-09-11 DIAGNOSIS — G4733 Obstructive sleep apnea (adult) (pediatric): Secondary | ICD-10-CM | POA: Diagnosis not present

## 2022-09-11 DIAGNOSIS — M85832 Other specified disorders of bone density and structure, left forearm: Secondary | ICD-10-CM | POA: Diagnosis not present

## 2022-09-11 DIAGNOSIS — M85852 Other specified disorders of bone density and structure, left thigh: Secondary | ICD-10-CM | POA: Diagnosis not present

## 2022-09-11 DIAGNOSIS — M8580 Other specified disorders of bone density and structure, unspecified site: Secondary | ICD-10-CM | POA: Diagnosis not present

## 2022-09-11 LAB — HM DEXA SCAN

## 2022-09-12 ENCOUNTER — Encounter: Payer: Self-pay | Admitting: Family Medicine

## 2022-09-13 ENCOUNTER — Telehealth: Payer: Self-pay

## 2022-09-13 NOTE — Progress Notes (Signed)
Care Management & Coordination Services Pharmacy Team  Reason for Encounter: Diabetes  Contacted patient to discuss diabetes disease state. Spoke with patient on 09/13/2022   Current antihyperglycemic regimen:  Pioglitazone 15mg  daily Patient verbally confirms she is taking the above medications as directed. Yes  What diet changes have been made to improve diabetes control? Pt is not eating as much and watches her portion control   What recent interventions/DTPs have been made to improve glycemic control:  No recent changes   Have there been any recent hospitalizations or ED visits since last visit with PharmD? No  Patient denies hypoglycemic symptoms, including None  Patient denies hyperglycemic symptoms, including none  How often are you checking your blood sugar? in the morning before eating or drinking  What are your blood sugars ranging?  Pt is currently on medications for a cold that may affect her sugars. Zyrtec and Augmentin that was prescribed by the urgent care in [place]. Fasting: The last week 135, 144, 129, 113, 138, 119, 118, 133  During the week, how often does your blood glucose drop below 70? Never  Are you checking your feet daily/regularly? Yes  Adherence Review: Is the patient currently on a STATIN medication? Yes Is the patient currently on ACE/ARB medication? Yes Does the patient have >5 day gap between last estimated fill dates? No   Chart Updates:  Recent office visits:  08/27/22 Langley Gauss PA. Seen for URI. No med changes.   Recent consult visits:  None  Hospital visits:  None  Medications: Outpatient Encounter Medications as of 09/13/2022  Medication Sig   Accu-Chek Softclix Lancets lancets CHECK BLOOD SUGAR DAILY   acetaminophen (TYLENOL) 500 MG tablet Take 1,000 mg by mouth every 6 (six) hours as needed for moderate pain or headache.   aspirin EC 81 MG tablet Take 1 tablet (81 mg total) by mouth daily. Swallow whole.   Blood Glucose  Monitoring Suppl (ACCU-CHEK GUIDE ME) w/Device KIT Use as directed   Calcium Carb-Cholecalciferol (CALCIUM 600 + D PO) Take 2 tablets by mouth daily.   cetirizine (ZYRTEC) 10 MG tablet Take 10 mg by mouth daily.   chlorthalidone (HYGROTON) 50 MG tablet TAKE 1 TABLET BY MOUTH DAILY   diltiazem (CARDIZEM) 30 MG tablet Take 1 tablet (30 mg total) by mouth every 6 (six) hours as needed (If your heart rate is greater than 100.).   famotidine (PEPCID) 40 MG tablet TAKE 1 TABLET BY MOUTH AT  BEDTIME   fluticasone (CUTIVATE) 0.05 % cream Apply topically 2 (two) times daily. For 10 days to rectum   fluticasone (FLONASE) 50 MCG/ACT nasal spray Place 1 spray into the nose daily as needed for allergies.   glucose blood (ACCU-CHEK GUIDE) test strip 1 each by Other route daily in the afternoon.   hydrocortisone (ANUSOL-HC) 25 MG suppository Place 1 suppository (25 mg total) rectally 2 (two) times daily.   linaclotide (LINZESS) 145 MCG CAPS capsule Take 1 capsule (145 mcg total) by mouth daily before breakfast.   Melatonin 5 MG CAPS Take 5 mg by mouth at bedtime.   Multiple Vitamin (MULTIVITAMIN WITH MINERALS) TABS tablet Take 1 tablet by mouth daily.   omeprazole (PRILOSEC) 40 MG capsule TAKE 1 CAPSULE BY MOUTH TWICE  DAILY BEFORE MEALS   pioglitazone (ACTOS) 15 MG tablet Take 1 tablet (15 mg total) by mouth daily.   potassium chloride SA (KLOR-CON M) 20 MEQ tablet TAKE 2 TABLETS BY MOUTH TWICE  DAILY   pravastatin (PRAVACHOL) 40 MG  tablet TAKE 1 TABLET BY MOUTH AT  BEDTIME   PREMARIN vaginal cream APPLY TWICE A DAY X 2 WEEKS, THEN TWICE A WEEK.   sulfamethoxazole-trimethoprim (BACTRIM DS) 800-160 MG tablet Take 1 tablet by mouth 2 (two) times daily.   traZODone (DESYREL) 100 MG tablet TAKE 1 TABLET BY MOUTH BEFORE  BEDTIME   valsartan (DIOVAN) 320 MG tablet TAKE 1 TABLET BY MOUTH DAILY   No facility-administered encounter medications on file as of 09/13/2022.    Recent Relevant Labs: Lab Results   Component Value Date/Time   HGBA1C 7.0 (H) 06/28/2022 10:56 AM   HGBA1C 7.8 (H) 03/28/2022 11:52 AM   MICROALBUR 10 12/13/2020 10:44 AM   MICROALBUR 10 05/09/2020 11:32 AM    Kidney Function Lab Results  Component Value Date/Time   CREATININE 0.86 06/28/2022 10:56 AM   CREATININE 0.84 03/28/2022 11:52 AM   GFRNONAA >60 08/13/2020 05:29 AM   GFRAA 84 05/09/2020 11:32 AM    Star Rating Drugs:  Medication:  Last Fill: Day Supply Pioglitazone  08/19/22-05/28/22 90ds Pravastatin   08/15/22-05/12/22 100ds Valsartan   09/07/22-06/06/22  100ds  Care Gaps: Annual wellness visit in last year? Yes Last eye exam / retinopathy screening:Overdue since 03/21/22 Last diabetic foot exam:06/28/22   Roxana Hires, [facility] Clinical Pharmacist Assistant  [phone]

## 2022-09-18 ENCOUNTER — Ambulatory Visit (INDEPENDENT_AMBULATORY_CARE_PROVIDER_SITE_OTHER): Payer: Medicare Other

## 2022-09-18 VITALS — BP 128/72 | HR 72 | Resp 16 | Ht 66.0 in | Wt 205.0 lb

## 2022-09-18 DIAGNOSIS — Z0000 Encounter for general adult medical examination without abnormal findings: Secondary | ICD-10-CM | POA: Diagnosis not present

## 2022-09-18 NOTE — Patient Instructions (Signed)
Megan Galvan , Thank you for taking time to come for your Medicare Wellness Visit. I appreciate your ongoing commitment to your health goals. Please review the following plan we discussed and let me know if I can assist you in the future.   Screening recommendations/referrals: Colonoscopy: Due in October Mammogram: Due in October Bone Density: Due 08/2024 Recommended yearly ophthalmology/optometry visit for glaucoma screening and checkup Recommended yearly dental visit for hygiene and checkup  Vaccinations: Influenza vaccine: Due in the fall Pneumococcal vaccine: up-to-date Tdap vaccine: Due 10/2024 Shingles vaccine: Due - you can get this at the pharmacy    Advanced directives: Please bring a copy for your medical record   Preventive Care 65 Years and Older, Female   Preventive care refers to lifestyle choices and visits with your health care provider that can promote health and wellness.  What does preventive care include? A yearly physical exam. This is also called an annual well check. Dental exams once or twice a year. Routine eye exams. Ask your health care provider how often you should have your eyes checked. Personal lifestyle choices, including: Daily care of your teeth and gums. Regular physical activity. Eating a healthy diet. Avoiding tobacco and drug use. Limiting alcohol use. Practicing safe sex. Taking low-dose aspirin every day. Taking vitamin and mineral supplements as recommended by your health care provider.  What happens during an annual well check? The services and screenings done by your health care provider during your annual well check will depend on your age, overall health, lifestyle risk factors, and family history of disease.  Counseling Your health care provider may ask you questions about your: Alcohol use. Tobacco use. Drug use. Emotional well-being. Home and relationship well-being. Sexual activity. Eating habits. History of falls. Memory  and ability to understand (cognition). Work and work Astronomer. Reproductive health.  Screening You may have the following tests or measurements: Height, weight, and BMI. Blood pressure. Lipid and cholesterol levels. These may be checked every 5 years, or more frequently if you are over 38 years old. Skin check. Lung cancer screening. You may have this screening every year starting at age 69 if you have a 30-pack-year history of smoking and currently smoke or have quit within the past 15 years. Fecal occult blood test (FOBT) of the stool. You may have this test every year starting at age 38. Flexible sigmoidoscopy or colonoscopy. You may have a sigmoidoscopy every 5 years or a colonoscopy every 10 years starting at age 68. Hepatitis C blood test. Hepatitis B blood test. Sexually transmitted disease (STD) testing. Diabetes screening. This is done by checking your blood sugar (glucose) after you have not eaten for a while (fasting). You may have this done every 1-3 years. Bone density scan. This is done to screen for osteoporosis. You may have this done starting at age 29. Mammogram. This may be done every 1-2 years. Talk to your health care provider about how often you should have regular mammograms. Talk with your health care provider about your test results, treatment options, and if necessary, the need for more tests.  Vaccines Your health care provider may recommend certain vaccines, such as: Influenza vaccine. This is recommended every year. Tetanus, diphtheria, and acellular pertussis (Tdap, Td) vaccine. You may need a Td booster every 10 years. Zoster vaccine. You may need this after age 87. Pneumococcal 13-valent conjugate (PCV13) vaccine. One dose is recommended after age 60. Pneumococcal polysaccharide (PPSV23) vaccine. One dose is recommended after age 79. Talk to your  health care provider about which screenings and vaccines you need and how often you need them.  This  information is not intended to replace advice given to you by your health care provider. Make sure you discuss any questions you have with your health care provider. Document Released: 05/12/2015 Document Revised: 01/03/2016 Document Reviewed: 02/14/2015 Elsevier Interactive Patient Education  2017 [employer].   Fall Prevention in the Home  Falls can cause injuries. They can happen to people of all ages. There are many things you can do to make your home safe and to help prevent falls.  What can I do on the outside of my home? Regularly fix the edges of walkways and driveways and fix any cracks. Remove anything that might make you trip as you walk through a door, such as a raised step or threshold. Trim any bushes or trees on the path to your home. Use bright outdoor lighting. Clear any walking paths of anything that might make someone trip, such as rocks or tools. Regularly check to see if handrails are loose or broken. Make sure that both sides of any steps have handrails. Any raised decks and porches should have guardrails on the edges. Have any leaves, snow, or ice cleared regularly. Use sand or salt on walking paths during winter. Clean up any spills in your garage right away. This includes oil or grease spills.  What can I do in the bathroom? Use night lights. Install grab bars by the toilet and in the tub and shower. Do not use towel bars as grab bars. Use non-skid mats or decals in the tub or shower. If you need to sit down in the shower, use a plastic, non-slip stool. Keep the floor dry. Clean up any water that spills on the floor as soon as it happens. Remove soap buildup in the tub or shower regularly. Attach bath mats securely with double-sided non-slip rug tape. Do not have throw rugs and other things on the floor that can make you trip.  What can I do in the bedroom? Use night lights. Make sure that you have a light by your bed that is easy to reach. Do not use any  sheets or blankets that are too big for your bed. They should not hang down onto the floor. Have a firm chair that has side arms. You can use this for support while you get dressed. Do not have throw rugs and other things on the floor that can make you trip.  What can I do in the kitchen? Clean up any spills right away. Avoid walking on wet floors. Keep items that you use a lot in easy-to-reach places. If you need to reach something above you, use a strong step stool that has a grab bar. Keep electrical cords out of the way. Do not use floor polish or wax that makes floors slippery. If you must use wax, use non-skid floor wax. Do not have throw rugs and other things on the floor that can make you trip.  What can I do with my stairs? Do not leave any items on the stairs. Make sure that there are handrails on both sides of the stairs and use them. Fix handrails that are broken or loose. Make sure that handrails are as long as the stairways. Check any carpeting to make sure that it is firmly attached to the stairs. Fix any carpet that is loose or worn. Avoid having throw rugs at the top or bottom of the stairs.  If you do have throw rugs, attach them to the floor with carpet tape. Make sure that you have a light switch at the top of the stairs and the bottom of the stairs. If you do not have them, ask someone to add them for you.  What else can I do to help prevent falls? Wear shoes that: Do not have high heels. Have rubber bottoms. Are comfortable and fit you well. Are closed at the toe. Do not wear sandals. If you use a stepladder: Make sure that it is fully opened. Do not climb a closed stepladder. Make sure that both sides of the stepladder are locked into place. Ask someone to hold it for you, if possible. Clearly mark and make sure that you can see: Any grab bars or handrails. First and last steps. Where the edge of each step is. Use tools that help you move around (mobility aids)  if they are needed. These include: Canes. Walkers. Scooters. Crutches. Turn on the lights when you go into a dark area. Replace any light bulbs as soon as they burn out. Set up your furniture so you have a clear path. Avoid moving your furniture around. If any of your floors are uneven, fix them. If there are any pets around you, be aware of where they are. Review your medicines with your doctor. Some medicines can make you feel dizzy. This can increase your chance of falling. Ask your doctor what other things that you can do to help prevent falls.  This information is not intended to replace advice given to you by your health care provider. Make sure you discuss any questions you have with your health care provider. Document Released: 02/09/2009 Document Revised: 09/21/2015 Document Reviewed: 05/20/2014 Elsevier Interactive Patient Education  2017 [employer].

## 2022-09-18 NOTE — Progress Notes (Signed)
Subjective:   Megan Galvan is a 72 y.o. female who presents for Medicare Annual (Subsequent) preventive examination.  This wellness visit is conducted by a nurse.  The patient's medications were reviewed and reconciled since the patient's last visit.  History details were provided by the patient.  The history appears to be reliable.    Medical History: Patient history and Family history was reviewed  Medications, Allergies, and preventative health maintenance was reviewed and updated.  Cardiac Risk Factors include: advanced age (>64men, >58 women);diabetes mellitus;dyslipidemia;obesity (BMI >30kg/m2)     Objective:    Today's Vitals   09/18/22 1059  BP: 128/72  Pulse: 72  Resp: 16  SpO2: 97%  Weight: 205 lb (93 kg)  Height: 5\' 6"  (1.676 m)  PainSc: 0-No pain   Body mass index is 33.09 kg/m.     09/17/2021    2:19 PM 08/15/2020   10:17 AM 08/08/2020   12:21 PM 07/31/2020    1:25 PM 07/27/2020    2:14 PM 03/27/2020    3:11 PM  Advanced Directives  Does Patient Have a Medical Advance Directive? No No No  No No  Would patient like information on creating a medical advance directive? No - Patient declined No - Patient declined No - Patient declined No - Patient declined No - Patient declined     Current Medications (verified) Outpatient Encounter Medications as of 09/18/2022  Medication Sig   Accu-Chek Softclix Lancets lancets CHECK BLOOD SUGAR DAILY   acetaminophen (TYLENOL) 500 MG tablet Take 1,000 mg by mouth every 6 (six) hours as needed for moderate pain or headache.   aspirin EC 81 MG tablet Take 1 tablet (81 mg total) by mouth daily. Swallow whole.   Blood Glucose Monitoring Suppl (ACCU-CHEK GUIDE ME) w/Device KIT Use as directed   Calcium Carb-Cholecalciferol (CALCIUM 600 + D PO) Take 2 tablets by mouth daily.   cetirizine (ZYRTEC) 10 MG tablet Take 10 mg by mouth daily.   chlorthalidone (HYGROTON) 50 MG tablet TAKE 1 TABLET BY MOUTH DAILY   diltiazem (CARDIZEM) 30  MG tablet Take 1 tablet (30 mg total) by mouth every 6 (six) hours as needed (If your heart rate is greater than 100.).   famotidine (PEPCID) 40 MG tablet TAKE 1 TABLET BY MOUTH AT  BEDTIME   fluticasone (CUTIVATE) 0.05 % cream Apply topically 2 (two) times daily. For 10 days to rectum   fluticasone (FLONASE) 50 MCG/ACT nasal spray Place 1 spray into the nose daily as needed for allergies.   glucose blood (ACCU-CHEK GUIDE) test strip 1 each by Other route daily in the afternoon.   hydrocortisone (ANUSOL-HC) 25 MG suppository Place 1 suppository (25 mg total) rectally 2 (two) times daily.   Melatonin 5 MG CAPS Take 5 mg by mouth at bedtime.   Multiple Vitamin (MULTIVITAMIN WITH MINERALS) TABS tablet Take 1 tablet by mouth daily.   omeprazole (PRILOSEC) 40 MG capsule TAKE 1 CAPSULE BY MOUTH TWICE  DAILY BEFORE MEALS   pioglitazone (ACTOS) 15 MG tablet Take 1 tablet (15 mg total) by mouth daily.   potassium chloride SA (KLOR-CON M) 20 MEQ tablet TAKE 2 TABLETS BY MOUTH TWICE  DAILY   pravastatin (PRAVACHOL) 40 MG tablet TAKE 1 TABLET BY MOUTH AT  BEDTIME   PREMARIN vaginal cream APPLY TWICE A DAY X 2 WEEKS, THEN TWICE A WEEK.   sulfamethoxazole-trimethoprim (BACTRIM DS) 800-160 MG tablet Take 1 tablet by mouth 2 (two) times daily.   traZODone (DESYREL) 100  MG tablet TAKE 1 TABLET BY MOUTH BEFORE  BEDTIME   valsartan (DIOVAN) 320 MG tablet TAKE 1 TABLET BY MOUTH DAILY   [DISCONTINUED] linaclotide (LINZESS) 145 MCG CAPS capsule Take 1 capsule (145 mcg total) by mouth daily before breakfast.   No facility-administered encounter medications on file as of 09/18/2022.    Allergies (verified) Ace inhibitors, Farxiga [dapagliflozin], Jardiance [empagliflozin], Keflex [cephalexin], Latex, Metformin and related, Nexium [esomeprazole magnesium], Protonix [pantoprazole sodium], and Doxycycline   History: Past Medical History:  Diagnosis Date   Allergy    Arthritis    Cataract    Diabetes mellitus  without complication (HCC)    Nonrheumatic aortic (valve) stenosis    Nonrheumatic aortic (valve) stenosis    Plantar fasciitis    S/P aortic valve replacement with bioprosthetic valve 08/10/2020   21 mm Edwards Resilia Inspiria Bioprosthetic Tissue Valve  SN [id number] Model [id number]   Sleep apnea    cpap   Past Surgical History:  Procedure Laterality Date   ANTERIOR CERVICAL DECOMP/DISCECTOMY FUSION     x 2   AORTIC VALVE REPLACEMENT N/A 08/10/2020   Procedure: AORTIC VALVE REPLACEMENT (AVR) USING INSPIRIS VALVE SIZE ;  Surgeon: Purcell Nails, MD;  Location: [facility] OR;  Service: Open Heart Surgery;  Laterality: N/A;   BACK SURGERY  11/17/2016   L3-L5   BILATERAL CARPAL TUNNEL RELEASE     bone spur Left    left shoulder   bone spur Right    Right shoulder   CATARACT EXTRACTION Left 09/18/2021   COLONOSCOPY  08/22/2011   Moderate predominantly sigmoid diverticulosis. Small internal hemorrhoids.    COLONOSCOPY     HEMORROIDECTOMY  02/2019   NECK SURGERY     RIGHT/LEFT HEART CATH AND CORONARY ANGIOGRAPHY N/A 07/27/2020   Procedure: RIGHT/LEFT HEART CATH AND CORONARY ANGIOGRAPHY;  Surgeon: Tonny Bollman, MD;  Location: [facility] INVASIVE CV LAB;  Service: Cardiovascular;  Laterality: N/A;   TEE WITHOUT CARDIOVERSION N/A 08/10/2020   Procedure: TRANSESOPHAGEAL ECHOCARDIOGRAM (TEE);  Surgeon: Purcell Nails, MD;  Location: [facility] OR;  Service: Open Heart Surgery;  Laterality: N/A;   torn rotator cuff Right    Right shoulder   torn rotator cuff Left    left shoulder   TRIGGER FINGER RELEASE Left    thumb   TRIGGER FINGER RELEASE Right    thumb and middle finger   UPPER GASTROINTESTINAL ENDOSCOPY     WRIST SURGERY Left    torn ligament   WRIST SURGERY Right    cyst   Family History  Problem Relation Age of Onset   Diabetes Mother    Heart Problems Mother    Heart Problems Maternal Grandfather    Colon cancer Paternal Grandmother    Esophageal cancer Neg Hx    Rectal cancer Neg Hx     Stomach cancer Neg Hx    Breast cancer Neg Hx    Social History   Socioeconomic History   Marital status: Married    Spouse name: Eddie   Number of children: 2   Years of education: Not on file   Highest education level: Not on file  Occupational History   Not on file  Tobacco Use   Smoking status: Never   Smokeless tobacco: Never  Vaping Use   Vaping Use: Never used  Substance and Sexual Activity   Alcohol use: Never   Drug use: Never   Sexual activity: Not on file  Other Topics Concern   Not on file  Social History  Narrative   One son lives in home, the other son lives about 3 miles away   Social Determinants of Health   Financial Resource Strain: Low Risk  (09/18/2022)   Overall Financial Resource Strain (CARDIA)    Difficulty of Paying Living Expenses: Not very hard  Food Insecurity: No Food Insecurity (09/18/2022)   Hunger Vital Sign    Worried About Running Out of Food in the Last Year: Never true    Ran Out of Food in the Last Year: Never true  Transportation Needs: No Transportation Needs (09/18/2022)   PRAPARE - Administrator, Civil Service (Medical): No    Lack of Transportation (Non-Medical): No  Physical Activity: Inactive (09/18/2022)   Exercise Vital Sign    Days of Exercise per Week: 0 days    Minutes of Exercise per Session: 0 min  Stress: No Stress Concern Present (09/18/2022)   [employer] of Occupational Health - Occupational Stress Questionnaire    Feeling of Stress : Not at all  Social Connections: Moderately Integrated (09/18/2022)   Social Connection and Isolation Panel [NHANES]    Frequency of Communication with Friends and Family: More than three times a week    Frequency of Social Gatherings with Friends and Family: More than three times a week    Attends Religious Services: More than 4 times per year    Active Member of [employer] or Organizations: No    Attends Engineer, structural: Never    Marital Status: Married     Tobacco Counseling Counseling given: Not Answered   Clinical Intake:  Pre-visit preparation completed: Yes Pain : No/denies pain Pain Score: 0-No pain   BMI - recorded: 33.09 Nutritional Status: BMI > 30  Obese Nutritional Risks: None Diabetes: Yes (most recent A1C 7.0) CBG done?: No Did pt. bring in CBG monitor from home?: No How often do you need to have someone help you when you read instructions, pamphlets, or other written materials from your doctor or pharmacy?: 1 - Never Interpreter Needed?: No     Activities of Daily Living    09/18/2022   11:15 AM 08/27/2022    3:28 PM  In your present state of health, do you have any difficulty performing the following activities:  Hearing? 0 0  Vision? 0 0  Difficulty concentrating or making decisions? 0 0  Walking or climbing stairs? 0 0  Dressing or bathing? 0 0  Doing errands, shopping? 0 0  Preparing Food and eating ? N   Using the Toilet? N   In the past six months, have you accidently leaked urine? N   Do you have problems with loss of bowel control? N   Managing your Medications? N   Managing your Finances? N   Housekeeping or managing your Housekeeping? N     Patient Care Team: Blane Ohara, MD as PCP - General (Family Medicine) Zettie Pho, [facility] as Pharmacist (Pharmacist) Lynann Bologna, MD as Consulting Physician (Gastroenterology) Baldo Daub, MD as Consulting Physician (Cardiology)  Indicate any recent Medical Services you may have received from other than [facility] providers in the past year (date may be approximate).     Assessment:   This is a routine wellness examination for Haylynn.  Hearing/Vision screen No results found.  Dietary issues and exercise activities discussed: Current Exercise Habits: The patient does not participate in regular exercise at present, Exercise limited by: None identified   Goals Addressed  This Visit's Progress    Exercise 3x per week (30 min per  time)       GOAL:  -At least 150 minutes a week (for example, 30 minutes a day, 5 days a week) of moderate-intensity activity such as brisk walking. Or 75 minutes a week of vigorous-intensity activity such as hiking, jogging, or running. -At least 2 days a week of activities that strengthen muscles. -Start slowly and gradually increase time and intensity as tolerated.        Depression Screen    09/18/2022   11:03 AM 08/27/2022    3:28 PM 06/28/2022   10:15 AM 03/28/2022   11:45 AM 09/17/2021    2:16 PM 07/13/2021   10:55 AM 06/14/2021    3:55 PM  PHQ 2/9 Scores  PHQ - 2 Score 0 0 0 0 0 0 2  PHQ- 9 Score       2    Fall Risk    09/18/2022   10:53 AM 08/27/2022    3:28 PM 06/17/2022    3:36 PM 09/17/2021    2:19 PM 07/13/2021   10:54 AM  Fall Risk   Falls in the past year? 0 0 0 0 0  Number falls in past yr: 0 0 0 0 0  Injury with Fall? 0 0 0 0 0  Risk for fall due to : No Fall Risks No Fall Risks No Fall Risks No Fall Risks   Follow up Falls evaluation completed Falls evaluation completed Falls evaluation completed Falls evaluation completed;Education provided;Falls prevention discussed Falls evaluation completed    FALL RISK PREVENTION PERTAINING TO THE HOME:  Any stairs in or around the home? No  If so, are there any without handrails? No  Home free of loose throw rugs in walkways, pet beds, electrical cords, etc? Yes  Adequate lighting in your home to reduce risk of falls? Yes   ASSISTIVE DEVICES UTILIZED TO PREVENT FALLS:  Life alert? No  Use of a cane, walker or w/c? No  Grab bars in the bathroom? Yes  Shower chair or bench in shower? No  Elevated toilet seat or a handicapped toilet? No   Gait slow and steady without use of assistive device  Cognitive Function:        09/18/2022   11:16 AM 09/17/2021    2:20 PM 03/27/2020    3:15 PM  6CIT Screen  What Year? 0 points 0 points 0 points  What month? 0 points 0 points 0 points  What time? 0 points 0 points 0  points  Count back from 20 0 points 0 points 0 points  Months in reverse 0 points 0 points 0 points  Repeat phrase 2 points 4 points 4 points  Total Score 2 points 4 points 4 points    Immunizations Immunization History  Administered Date(s) Administered   Fluad Quad(high Dose 65+) 02/12/2022   Hepatitis B 06/22/2013, 11/16/2014, 05/26/2015   Influenza, High Dose Seasonal PF 12/29/2018   Influenza-Unspecified 01/27/2014, 01/24/2020, 01/27/2021   Moderna Covid-19 Vaccine Bivalent Booster 91yrs & up 03/16/2021   Moderna SARS-COV2 Booster Vaccination 02/29/2020, 09/19/2020   Moderna Sars-Covid-2 Vaccination 06/07/2019, 07/05/2019   PNEUMOCOCCAL CONJUGATE-20 09/13/2021   Pneumococcal Conjugate-13 12/29/2013   Pneumococcal Polysaccharide-23 04/13/2012   Td 11/23/2014    TDAP status: Up to date  Flu Vaccine status: Up to date  Pneumococcal vaccine status: Up to date  Covid-19 vaccine status: Completed vaccines  Qualifies for Shingles Vaccine? Yes   Zostavax completed Yes  Shingrix Completed?: No.    Education has been provided regarding the importance of this vaccine. Patient has been advised to call insurance company to determine out of pocket expense if they have not yet received this vaccine. Advised may also receive vaccine at local pharmacy or Health Dept. Verbalized acceptance and understanding.  Screening Tests Health Maintenance  Topic Date Due   Hepatitis C Screening  Never done   Zoster Vaccines- Shingrix (1 of 2) Never done   OPHTHALMOLOGY EXAM  03/21/2022   Medicare Annual Wellness (AWV)  09/14/2022   COVID-19 Vaccine (4 - 2023-24 season) 11/28/2022 (Originally 12/28/2021)   INFLUENZA VACCINE  11/28/2022   HEMOGLOBIN A1C  12/29/2022   COLONOSCOPY (Pts 45-34yrs Insurance coverage will need to be confirmed)  02/25/2023   Diabetic kidney evaluation - eGFR measurement  06/28/2023   Diabetic kidney evaluation - Urine ACR  06/28/2023   FOOT EXAM  06/28/2023   MAMMOGRAM   02/21/2024   DTaP/Tdap/Td (2 - Tdap) 11/22/2024   Pneumonia Vaccine 86+ Years old  Completed   DEXA SCAN  Completed   HPV VACCINES  Aged Out    Health Maintenance  Health Maintenance Due  Topic Date Due   Hepatitis C Screening  Never done   Zoster Vaccines- Shingrix (1 of 2) Never done   OPHTHALMOLOGY EXAM  03/21/2022   Medicare Annual Wellness (AWV)  09/14/2022    Colorectal cancer screening: Type of screening: Colonoscopy. Completed 01/2018. Repeat every 5 years  Mammogram status: Completed 02/20/22. Repeat every year  Bone Density status: Completed 09/11/22. Results reflect: Bone density results: OSTEOPENIA. Repeat every 2 years.  Lung Cancer Screening: (Low Dose CT Chest recommended if Age 31-80 years, 30 pack-year currently smoking OR have quit w/in 15years.) does not qualify.    Additional Screening:  Vision Screening: Recommended annual ophthalmology exams for early detection of glaucoma and other disorders of the eye. Is the patient up to date with their annual eye exam?  Yes  Who is the provider or what is the name of the office in which the patient attends annual eye exams? Dr Dan Humphreys  Dental Screening: Recommended annual dental exams for proper oral hygiene  Community Resource Referral / Chronic Care Management: CRR required this visit?  No   CCM required this visit?  No      Plan:    1- Shingles vaccine recommended 2- Diabetic eye exam records requested; patient has an upcoming appointment in July 3- Mammogram due in October 4- Colonoscopy due in October (5y follow-up)  I have personally reviewed and noted the following in the patient's chart:   Medical and social history Use of alcohol, tobacco or illicit drugs  Current medications and supplements including opioid prescriptions. Patient is not currently taking opioid prescriptions. Functional ability and status Nutritional status Physical activity Advanced directives List of other  physicians Hospitalizations, surgeries, and ER visits in previous 12 months Vitals Screenings to include cognitive, depression, and falls Referrals and appointments  In addition, I have reviewed and discussed with patient certain preventive protocols, quality metrics, and best practice recommendations. A written personalized care plan for preventive services as well as general preventive health recommendations were provided to patient.     Jacklynn Bue, LPN   [id number]

## 2022-10-07 ENCOUNTER — Telehealth: Payer: Self-pay

## 2022-10-07 DIAGNOSIS — B3731 Acute candidiasis of vulva and vagina: Secondary | ICD-10-CM | POA: Diagnosis not present

## 2022-10-07 DIAGNOSIS — B3732 Chronic candidiasis of vulva and vagina: Secondary | ICD-10-CM | POA: Diagnosis not present

## 2022-10-07 NOTE — Progress Notes (Cosign Needed Addendum)
Care Management & Coordination Services Pharmacy Team  Reason for Encounter: Appointment Reminder  Contacted patient to confirm telephone appointment with Artelia Laroche, PharmD on 10/10/22 at 11:00 am.  Unsuccessful outreach. Left voicemail for patient to return call.  Chart review:  Recent office visits:  09/18/22 Caro Hight LPN. Seen for Medicare Annual Wellness.  D/C Linaclotide Follow up in 1 year  Recent consult visits:  07/25/22 Jettie Pagan MD (Urology) Seen for Recurrent UTI, Postmenopausal Atrophic Vaginitis. No med changes or follow ups.   Hospital visits:  None   Star Rating Drugs:  Medication:  Last Fill: Day Supply Pioglitazone  08/19/22-05/28/22 90ds Pravastatin   08/15/22-05/12/22 100ds Valsartan  09/07/22-06/06/22  100ds  Care Gaps: Annual wellness visit in last year? Yes, 09/18/22 Colonoscopy: 02/24/18 Mammogram: 02/20/22 Dexa Scan: 09/11/22 Last eye exam / retinopathy screening:Overdue since 03/21/22 Last diabetic foot exam:06/28/22 Urine ACR: 06/28/22   Roxana Hires, CMA Clinical Pharmacist Assistant  [phone]

## 2022-10-11 ENCOUNTER — Ambulatory Visit: Payer: Medicare Other | Admitting: Family Medicine

## 2022-10-16 ENCOUNTER — Encounter: Payer: Self-pay | Admitting: Family Medicine

## 2022-10-16 ENCOUNTER — Ambulatory Visit (INDEPENDENT_AMBULATORY_CARE_PROVIDER_SITE_OTHER): Payer: Medicare Other | Admitting: Family Medicine

## 2022-10-16 VITALS — BP 136/76 | HR 72 | Temp 96.7°F | Resp 16 | Ht 66.0 in | Wt 213.6 lb

## 2022-10-16 DIAGNOSIS — I152 Hypertension secondary to endocrine disorders: Secondary | ICD-10-CM

## 2022-10-16 DIAGNOSIS — M542 Cervicalgia: Secondary | ICD-10-CM | POA: Diagnosis not present

## 2022-10-16 DIAGNOSIS — E1159 Type 2 diabetes mellitus with other circulatory complications: Secondary | ICD-10-CM

## 2022-10-16 DIAGNOSIS — E1142 Type 2 diabetes mellitus with diabetic polyneuropathy: Secondary | ICD-10-CM

## 2022-10-16 DIAGNOSIS — E782 Mixed hyperlipidemia: Secondary | ICD-10-CM

## 2022-10-16 DIAGNOSIS — G8929 Other chronic pain: Secondary | ICD-10-CM | POA: Diagnosis not present

## 2022-10-16 DIAGNOSIS — N952 Postmenopausal atrophic vaginitis: Secondary | ICD-10-CM

## 2022-10-16 DIAGNOSIS — N7681 Mucositis (ulcerative) of vagina and vulva: Secondary | ICD-10-CM

## 2022-10-16 MED ORDER — PREMARIN 0.625 MG/GM VA CREA
TOPICAL_CREAM | VAGINAL | 1 refills | Status: DC
Start: 2022-10-16 — End: 2023-05-20

## 2022-10-16 NOTE — Progress Notes (Unsigned)
Subjective:  Patient ID: Megan Galvan, female    DOB: Jan 13, 1951  Age: 72 y.o. MRN: [id number]  Chief Complaint  Patient presents with   Medical Management of Chronic Issues    HPI Diabetes: She presents for her follow-up diabetic visit. She has type 2 diabetes mellitus.  Complications: diabetic polyneuropathy. Glucose checking:  daily  Glucose logs: Blood sugar range 113-190.  Hypoglycemia:none Most recent A1C: 7.3 Current medications: Actos 15 mg daily.   Last Eye Exam: overdue has appointment 10/2022.   Foot checks: daily. Diet: trying. Eating more fruit. Avoiding carbs/sweets.  Exercise: none   Hyperlipidemia: Current medications:  Pravastatin 40 mg daily    Hypertension: Current medications:  Chlorthalidone 50 mg daily, Valsartan 320 mg daily.     GERD: well controlled on famotidine and omeprazole twice daily.         10/16/2022    9:58 AM 09/18/2022   11:03 AM 08/27/2022    3:28 PM 06/28/2022   10:15 AM 03/28/2022   11:45 AM  Depression screen PHQ 2/9  Decreased Interest 0 0 0 0 0  Down, Depressed, Hopeless 0 0 0 0 0  PHQ - 2 Score 0 0 0 0 0  Altered sleeping 0      Tired, decreased energy 1      Change in appetite 0      Feeling bad or failure about yourself  0      Trouble concentrating 0      Moving slowly or fidgety/restless 0      Suicidal thoughts 0      PHQ-9 Score 1      Difficult doing work/chores Not difficult at all            10/16/2022    9:47 AM  Fall Risk   Falls in the past year? 0  Number falls in past yr: 0  Injury with Fall? 0  Risk for fall due to : No Fall Risks  Follow up Falls evaluation completed;Falls prevention discussed    Patient Care Team: Blane Ohara, MD as PCP - General (Family Medicine) Zettie Pho, [facility] (Inactive) as Pharmacist (Pharmacist) Lynann Bologna, MD as Consulting Physician (Gastroenterology) Baldo Daub, MD as Consulting Physician (Cardiology)   Review of Systems  Constitutional:  Negative  for chills, fatigue and fever.  HENT:  Negative for congestion, rhinorrhea and sore throat.   Respiratory:  Negative for cough and shortness of breath.   Cardiovascular:  Negative for chest pain.  Gastrointestinal:  Negative for abdominal pain, constipation, diarrhea, nausea and vomiting.  Genitourinary:  Negative for dysuria and urgency.  Musculoskeletal:  Positive for myalgias (pain right little toe) and neck pain. Negative for back pain.  Neurological:  Negative for dizziness, weakness, light-headedness and headaches.  Psychiatric/Behavioral:  Negative for dysphoric mood. The patient is not nervous/anxious.     Current Outpatient Medications on File Prior to Visit  Medication Sig Dispense Refill   Accu-Chek Softclix Lancets lancets CHECK BLOOD SUGAR DAILY 100 each 2   acetaminophen (TYLENOL) 500 MG tablet Take 1,000 mg by mouth every 6 (six) hours as needed for moderate pain or headache.     aspirin EC 81 MG tablet Take 1 tablet (81 mg total) by mouth daily. Swallow whole. 90 tablet 3   Blood Glucose Monitoring Suppl (ACCU-CHEK GUIDE ME) w/Device KIT Use as directed 1 kit 0   Calcium Carb-Cholecalciferol (CALCIUM 600 + D PO) Take 2 tablets by mouth daily.  cetirizine (ZYRTEC) 10 MG tablet Take 10 mg by mouth daily.     chlorthalidone (HYGROTON) 50 MG tablet TAKE 1 TABLET BY MOUTH DAILY 100 tablet 2   diltiazem (CARDIZEM) 30 MG tablet Take 1 tablet (30 mg total) by mouth every 6 (six) hours as needed (If your heart rate is greater than 100.). 30 tablet 3   famotidine (PEPCID) 40 MG tablet TAKE 1 TABLET BY MOUTH AT  BEDTIME 100 tablet 1   fluticasone (CUTIVATE) 0.05 % cream Apply topically 2 (two) times daily. For 10 days to rectum 30 g 2   fluticasone (FLONASE) 50 MCG/ACT nasal spray Place 1 spray into the nose daily as needed for allergies.     glucose blood (ACCU-CHEK GUIDE) test strip 1 each by Other route daily in the afternoon. 100 each 3   hydrocortisone (ANUSOL-HC) 25 MG  suppository Place 1 suppository (25 mg total) rectally 2 (two) times daily. 30 suppository 2   Melatonin 5 MG CAPS Take 5 mg by mouth at bedtime.     Multiple Vitamin (MULTIVITAMIN WITH MINERALS) TABS tablet Take 1 tablet by mouth daily.     omeprazole (PRILOSEC) 40 MG capsule TAKE 1 CAPSULE BY MOUTH TWICE  DAILY BEFORE MEALS 200 capsule 2   pioglitazone (ACTOS) 15 MG tablet Take 1 tablet (15 mg total) by mouth daily. 90 tablet 0   potassium chloride SA (KLOR-CON M) 20 MEQ tablet TAKE 2 TABLETS BY MOUTH TWICE  DAILY 400 tablet 1   pravastatin (PRAVACHOL) 40 MG tablet TAKE 1 TABLET BY MOUTH AT  BEDTIME 100 tablet 2   traZODone (DESYREL) 100 MG tablet TAKE 1 TABLET BY MOUTH BEFORE  BEDTIME 90 tablet 3   valsartan (DIOVAN) 320 MG tablet TAKE 1 TABLET BY MOUTH DAILY 100 tablet 2   No current facility-administered medications on file prior to visit.   Past Medical History:  Diagnosis Date   Allergy    Arthritis    Cataract    Diabetes mellitus without complication (HCC)    Nonrheumatic aortic (valve) stenosis    Nonrheumatic aortic (valve) stenosis    Plantar fasciitis    S/P aortic valve replacement with bioprosthetic valve 08/10/2020   21 mm Edwards Resilia Inspiria Bioprosthetic Tissue Valve  SN [id number] Model [id number]   Sleep apnea    cpap   Past Surgical History:  Procedure Laterality Date   ANTERIOR CERVICAL DECOMP/DISCECTOMY FUSION     x 2   AORTIC VALVE REPLACEMENT N/A 08/10/2020   Procedure: AORTIC VALVE REPLACEMENT (AVR) USING INSPIRIS VALVE SIZE ;  Surgeon: Purcell Nails, MD;  Location: [facility];  Service: Open Heart Surgery;  Laterality: N/A;   BACK SURGERY  11/17/2016   L3-L5   BILATERAL CARPAL TUNNEL RELEASE     bone spur Left    left shoulder   bone spur Right    Right shoulder   CATARACT EXTRACTION Left 09/18/2021   COLONOSCOPY  08/22/2011   Moderate predominantly sigmoid diverticulosis. Small internal hemorrhoids.    COLONOSCOPY     HEMORROIDECTOMY  02/2019    NECK SURGERY     RIGHT/LEFT HEART CATH AND CORONARY ANGIOGRAPHY N/A 07/27/2020   Procedure: RIGHT/LEFT HEART CATH AND CORONARY ANGIOGRAPHY;  Surgeon: Tonny Bollman, MD;  Location: [facility] INVASIVE CV LAB;  Service: Cardiovascular;  Laterality: N/A;   TEE WITHOUT CARDIOVERSION N/A 08/10/2020   Procedure: TRANSESOPHAGEAL ECHOCARDIOGRAM (TEE);  Surgeon: Purcell Nails, MD;  Location: [facility] OR;  Service: Open Heart Surgery;  Laterality: N/A;  torn rotator cuff Right    Right shoulder   torn rotator cuff Left    left shoulder   TRIGGER FINGER RELEASE Left    thumb   TRIGGER FINGER RELEASE Right    thumb and middle finger   UPPER GASTROINTESTINAL ENDOSCOPY     WRIST SURGERY Left    torn ligament   WRIST SURGERY Right    cyst    Family History  Problem Relation Age of Onset   Diabetes Mother    Heart Problems Mother    Heart Problems Maternal Grandfather    Colon cancer Paternal Grandmother    Esophageal cancer Neg Hx    Rectal cancer Neg Hx    Stomach cancer Neg Hx    Breast cancer Neg Hx    Social History   Socioeconomic History   Marital status: Married    Spouse name: Eddie   Number of children: 2   Years of education: Not on file   Highest education level: Not on file  Occupational History   Not on file  Tobacco Use   Smoking status: Never   Smokeless tobacco: Never  Vaping Use   Vaping Use: Never used  Substance and Sexual Activity   Alcohol use: Never   Drug use: Never   Sexual activity: Not on file  Other Topics Concern   Not on file  Social History Narrative   One son lives in home, the other son lives about 3 miles away   Social Determinants of Health   Financial Resource Strain: Low Risk  (09/18/2022)   Overall Financial Resource Strain (CARDIA)    Difficulty of Paying Living Expenses: Not very hard  Food Insecurity: No Food Insecurity (09/18/2022)   Hunger Vital Sign    Worried About Running Out of Food in the Last Year: Never true    Ran Out of Food in  the Last Year: Never true  Transportation Needs: No Transportation Needs (09/18/2022)   PRAPARE - Administrator, Civil Service (Medical): No    Lack of Transportation (Non-Medical): No  Physical Activity: Inactive (09/18/2022)   Exercise Vital Sign    Days of Exercise per Week: 0 days    Minutes of Exercise per Session: 0 min  Stress: No Stress Concern Present (09/18/2022)   [employer] of Occupational Health - Occupational Stress Questionnaire    Feeling of Stress : Not at all  Social Connections: Moderately Integrated (09/18/2022)   Social Connection and Isolation Panel [NHANES]    Frequency of Communication with Friends and Family: More than three times a week    Frequency of Social Gatherings with Friends and Family: More than three times a week    Attends Religious Services: More than 4 times per year    Active Member of [employer] or Organizations: No    Attends Engineer, structural: Never    Marital Status: Married    Objective:  BP 136/76   Pulse 72   Temp (!) 96.7 F (35.9 C)   Resp 16   Ht 5\' 6"  (1.676 m)   Wt 213 lb 9.6 oz (96.9 kg)   BMI 34.48 kg/m      10/16/2022   10:00 AM 10/16/2022    9:44 AM 09/18/2022   10:59 AM  BP/Weight  Systolic BP 136 140 128  Diastolic BP 76 72 72  Wt. (Lbs)  213.6 205  BMI  34.48 kg/m2 33.09 kg/m2    Physical Exam Vitals reviewed.  Constitutional:  Appearance: Normal appearance. She is normal weight.  Neck:     Vascular: No carotid bruit.  Cardiovascular:     Rate and Rhythm: Normal rate and regular rhythm.     Heart sounds: Murmur heard.  Pulmonary:     Effort: Pulmonary effort is normal. No respiratory distress.     Breath sounds: Normal breath sounds.  Abdominal:     General: Abdomen is flat. Bowel sounds are normal.     Palpations: Abdomen is soft.     Tenderness: There is no abdominal tenderness.  Genitourinary:    Comments: Yeast infection (seen last week per GYN).   Musculoskeletal:      Comments: Cervical paraspinal muscle tender. Limited ROM  with right rotation.   Neurological:     Mental Status: She is alert and oriented to person, place, and time.  Psychiatric:        Mood and Affect: Mood normal.        Behavior: Behavior normal.     Diabetic Foot Exam - Simple   Simple Foot Form Diabetic Foot exam was performed with the following findings: Yes 10/16/2022  9:49 AM  Visual Inspection No deformities, no ulcerations, no other skin breakdown bilaterally: Yes Sensation Testing Intact to touch and monofilament testing bilaterally: Yes Pulse Check Posterior Tibialis and Dorsalis pulse intact bilaterally: Yes Comments Pain right little toe.        Lab Results  Component Value Date   WBC 6.0 10/16/2022   HGB 11.8 10/16/2022   HCT 35.3 10/16/2022   PLT 199 10/16/2022   GLUCOSE 138 (H) 10/16/2022   CHOL 124 10/16/2022   TRIG 101 10/16/2022   HDL 45 10/16/2022   LDLCALC 60 10/16/2022   ALT 22 10/16/2022   AST 27 10/16/2022   NA 138 10/16/2022   K 4.1 10/16/2022   CL 96 10/16/2022   CREATININE 0.97 10/16/2022   BUN 16 10/16/2022   CO2 29 10/16/2022   TSH 2.360 09/04/2021   INR 1.5 (H) 08/10/2020   HGBA1C 7.0 (H) 10/16/2022   MICROALBUR 10 12/13/2020      Assessment & Plan:    Type 2 diabetes mellitus with diabetic polyneuropathy, without long-term current use of insulin (HCC) Assessment & Plan: Control: improved.  Recommend check sugars fasting daily. Recommend check feet daily. Recommend annual eye exams. Medicines: continue actos 15 mg daily.  Continue to work on eating a healthy diet and exercise.  Labs drawn today.     Orders: -     Hemoglobin A1c  Mixed hyperlipidemia Assessment & Plan: Well controlled.  No changes to medicines. Pravastatin 40 mg daily Continue to work on eating a healthy diet and exercise.  Labs drawn today.    Orders: -     Lipid panel  Hypertension associated with diabetes [facility]) Assessment & Plan: Well  controlled.  No changes to medicines. Continue Chlorthalidone 50 mg daily, Valsartan 320 mg daily.   Continue to work on eating a healthy diet and exercise.  Labs drawn today.    Orders: -     CBC with Differential/Platelet -     Comprehensive metabolic panel  Atrophic vaginitis Assessment & Plan: Improved.  Continue premarin cream.  Orders: -     Premarin; APPLY TWICE A week.  Dispense: 42.5 g; Refill: 1  Mucositis (ulcerative) of vagina and vulva Assessment & Plan: Continue with premarin vaginal cream  Orders: -     Premarin; APPLY TWICE A week.  Dispense: 42.5 g; Refill: 1  Neck pain, chronic Assessment & Plan: Referring to neurosurgery.  Orders: -     Ambulatory referral to Neurosurgery     Meds ordered this encounter  Medications   PREMARIN vaginal cream    Sig: APPLY TWICE A week.    Dispense:  42.5 g    Refill:  1    Orders Placed This Encounter  Procedures   CBC with Differential/Platelet   Comprehensive metabolic panel   Hemoglobin A1c   Lipid panel   Ambulatory referral to Neurosurgery     Follow-up: Return in about 3 months (around 01/16/2023) for fasting, chronic follow up.   I,Prim Morace M Sigourney Portillo,acting as a Neurosurgeon for Blane Ohara, MD.,have documented all relevant documentation on the behalf of Blane Ohara, MD,as directed by  Blane Ohara, MD while in the presence of Blane Ohara, MD.   Clayborn Bigness I Leal-Borjas,acting as a scribe for Blane Ohara, MD.,have documented all relevant documentation on the behalf of Blane Ohara, MD,as directed by  Blane Ohara, MD while in the presence of Blane Ohara, MD.    An After Visit Summary was printed and given to the patient.  Blane Ohara, MD [facility] [phone]

## 2022-10-16 NOTE — Patient Instructions (Signed)
Referral to [facility] Neurosurgery for evaluation of chronic neck pain. Continue healthy diet. Daily foot exams. Lab draw today.

## 2022-10-17 LAB — CBC WITH DIFFERENTIAL/PLATELET
Basophils Absolute: 0.1 10*3/uL (ref 0.0–0.2)
Basos: 1 %
EOS (ABSOLUTE): 0.1 10*3/uL (ref 0.0–0.4)
Eos: 2 %
Hematocrit: 35.3 % (ref 34.0–46.6)
Hemoglobin: 11.8 g/dL (ref 11.1–15.9)
Immature Grans (Abs): 0 10*3/uL (ref 0.0–0.1)
Immature Granulocytes: 0 %
Lymphocytes Absolute: 2.4 10*3/uL (ref 0.7–3.1)
Lymphs: 39 %
MCH: 33.4 pg — ABNORMAL HIGH (ref 26.6–33.0)
MCHC: 33.4 g/dL (ref 31.5–35.7)
MCV: 100 fL — ABNORMAL HIGH (ref 79–97)
Monocytes Absolute: 0.6 10*3/uL (ref 0.1–0.9)
Monocytes: 11 %
Neutrophils Absolute: 2.9 10*3/uL (ref 1.4–7.0)
Neutrophils: 47 %
Platelets: 199 10*3/uL (ref 150–450)
RBC: 3.53 x10E6/uL — ABNORMAL LOW (ref 3.77–5.28)
RDW: 12.9 % (ref 11.7–15.4)
WBC: 6 10*3/uL (ref 3.4–10.8)

## 2022-10-17 LAB — LIPID PANEL
Chol/HDL Ratio: 2.8 ratio (ref 0.0–4.4)
Cholesterol, Total: 124 mg/dL (ref 100–199)
HDL: 45 mg/dL (ref >39)
LDL Chol Calc (NIH): 60 mg/dL (ref 0–99)
Triglycerides: 101 mg/dL (ref 0–149)
VLDL Cholesterol Cal: 19 mg/dL (ref 5–40)

## 2022-10-17 LAB — COMPREHENSIVE METABOLIC PANEL
ALT: 22 IU/L (ref 0–32)
AST: 27 IU/L (ref 0–40)
Albumin: 4.3 g/dL (ref 3.8–4.8)
Alkaline Phosphatase: 64 IU/L (ref 44–121)
BUN/Creatinine Ratio: 16 (ref 12–28)
BUN: 16 mg/dL (ref 8–27)
Bilirubin Total: 0.5 mg/dL (ref 0.0–1.2)
CO2: 29 mmol/L (ref 20–29)
Calcium: 9.7 mg/dL (ref 8.7–10.3)
Chloride: 96 mmol/L (ref 96–106)
Creatinine, Ser: 0.97 mg/dL (ref 0.57–1.00)
Globulin, Total: 2.8 g/dL (ref 1.5–4.5)
Glucose: 138 mg/dL — ABNORMAL HIGH (ref 70–99)
Potassium: 4.1 mmol/L (ref 3.5–5.2)
Sodium: 138 mmol/L (ref 134–144)
Total Protein: 7.1 g/dL (ref 6.0–8.5)
eGFR: 62 mL/min/{1.73_m2} (ref >59)

## 2022-10-17 LAB — HEMOGLOBIN A1C
Est. average glucose Bld gHb Est-mCnc: 154 mg/dL
Hgb A1c MFr Bld: 7 % — ABNORMAL HIGH (ref 4.8–5.6)

## 2022-10-18 DIAGNOSIS — G8929 Other chronic pain: Secondary | ICD-10-CM

## 2022-10-18 HISTORY — DX: Other chronic pain: G89.29

## 2022-10-18 NOTE — Assessment & Plan Note (Signed)
Referring to neurosurgery 

## 2022-10-18 NOTE — Assessment & Plan Note (Signed)
Continue with premarin vaginal cream

## 2022-10-18 NOTE — Assessment & Plan Note (Signed)
Control: improved.  Recommend check sugars fasting daily. Recommend check feet daily. Recommend annual eye exams. Medicines: continue actos 15 mg daily.  Continue to work on eating a healthy diet and exercise.  Labs drawn today.    

## 2022-10-18 NOTE — Assessment & Plan Note (Signed)
Well controlled.  No changes to medicines. Continue Chlorthalidone 50 mg daily, Valsartan 320 mg daily.   Continue to work on eating a healthy diet and exercise.  Labs drawn today.   

## 2022-10-18 NOTE — Assessment & Plan Note (Signed)
Improved.  Continue premarin cream. 

## 2022-10-18 NOTE — Assessment & Plan Note (Signed)
Well controlled.  No changes to medicines. Pravastatin 40 mg daily Continue to work on eating a healthy diet and exercise.  Labs drawn today.   

## 2022-10-20 ENCOUNTER — Encounter: Payer: Self-pay | Admitting: Family Medicine

## 2022-10-21 ENCOUNTER — Encounter: Payer: Self-pay | Admitting: Pharmacist

## 2022-10-21 NOTE — Progress Notes (Unsigned)
Contacted patient regarding upcoming appointment with [facility] pharmacist. Per clinical review, no pharmacist appointment needed at this time. Appointment by canceled by care guide and voicemail was left by care guide notifying patient. Patient can re-engage with pharmacy team with future medication needs.

## 2022-10-29 DIAGNOSIS — M5412 Radiculopathy, cervical region: Secondary | ICD-10-CM | POA: Diagnosis not present

## 2022-11-12 ENCOUNTER — Other Ambulatory Visit: Payer: Self-pay | Admitting: Family Medicine

## 2022-11-12 DIAGNOSIS — H5203 Hypermetropia, bilateral: Secondary | ICD-10-CM | POA: Diagnosis not present

## 2022-11-12 DIAGNOSIS — I10 Essential (primary) hypertension: Secondary | ICD-10-CM

## 2022-11-12 DIAGNOSIS — E119 Type 2 diabetes mellitus without complications: Secondary | ICD-10-CM | POA: Diagnosis not present

## 2022-11-12 DIAGNOSIS — H52223 Regular astigmatism, bilateral: Secondary | ICD-10-CM | POA: Diagnosis not present

## 2022-11-12 DIAGNOSIS — H524 Presbyopia: Secondary | ICD-10-CM | POA: Diagnosis not present

## 2022-11-13 ENCOUNTER — Other Ambulatory Visit: Payer: Self-pay

## 2022-11-13 DIAGNOSIS — M5412 Radiculopathy, cervical region: Secondary | ICD-10-CM | POA: Diagnosis not present

## 2022-11-14 ENCOUNTER — Other Ambulatory Visit: Payer: Self-pay

## 2022-11-14 DIAGNOSIS — E1159 Type 2 diabetes mellitus with other circulatory complications: Secondary | ICD-10-CM

## 2022-11-15 MED ORDER — PIOGLITAZONE HCL 15 MG PO TABS: 15.0000 mg | ORAL_TABLET | Freq: Every day | ORAL | 0 refills | Status: DC

## 2022-11-17 NOTE — Progress Notes (Unsigned)
Cardiology Office Note:    Date:  11/18/2022   ID:  Megan Galvan, DOB 1951/02/27, MRN [id number]  PCP:  Blane Ohara, MD  Cardiologist:  Norman Herrlich, MD    Referring MD: Blane Ohara, MD    ASSESSMENT:    1. S/P aortic valve replacement with bioprosthetic valve   2. Essential hypertension   3. Mixed hyperlipidemia    PLAN:    In order of problems listed above:  She continues to do well following AVR she can stop aspirin for colonoscopy and continue her current lipid-lowering therapy pravastatin blood pressure well-controlled current combination includes thiazide diuretic calcium channel blocker and ARB continue the same LDL is at target and continue her statin   Next appointment: 1 year   Medication Adjustments/Labs and Tests Ordered: Current medicines are reviewed at length with the patient today.  Concerns regarding medicines are outlined above.  No orders of the defined types were placed in this encounter.  No orders of the defined types were placed in this encounter.    History of Present Illness:    Megan Galvan is a 72 y.o. 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  She has an [employer] bioprosthetic tissue valve 21 mm.  Following the visit she had an echocardiogram 11/28/2021 showing normal bioprosthetic aortic valve function.  Left ventricular ejection fraction remains normal 60 to 65% with mild left ventricular hypertrophy.  Compliance with diet, lifestyle and medications: Yes  She is apprehensive awaiting results of cervical spine MRI and she anticipates colonoscop and ask if she can stop aspirin and the answer is yes Overall is doing well intermittently she gets some very minor brief nonexertional chest discomfort prior to valve replacement 2022 she  had no coronary stenosis I do not think she requires repeat cardiac evaluation at this time.  She is on lipid-lowering therapy with a statin and is not having muscle pain or weakness No edema orthopnea palpitation or syncope  Past Medical History:  Diagnosis Date   Allergic sinusitis 08/27/2022   Atrophic vaginitis 03/31/2021   Cataract    Class 1 obesity due to excess calories with serious comorbidity and body mass index (BMI) of 33.0 to 33.9 in adult 03/28/2022   Encounter for osteoporosis screening in asymptomatic postmenopausal patient 06/28/2022   External hemorrhoids 11/12/2021   Gastro-esophageal reflux disease without esophagitis 06/03/2019   Hypertension associated with diabetes (HCC) 10/01/2019   Mixed hyperlipidemia    Mucositis (ulcerative) of vagina and vulva 06/21/2022   Neck pain, chronic 10/18/2022   Primary insomnia 06/03/2019   S/P aortic valve replacement with bioprosthetic valve 08/10/2020   21 mm [employer] Inspiria Bioprosthetic Tissue Valve  SN [id number] Model [id number]   Sleep apnea    cpap   Type 2 diabetes mellitus with diabetic polyneuropathy, without long-term current use of insulin (HCC) 06/03/2019   Vertigo 07/20/2019    Current Medications: No outpatient medications have been marked as taking for the 11/18/22 encounter (Appointment) with Baldo Daub, MD.      EKGs/Labs/Other Studies Reviewed:    The following studies were reviewed today:  Cardiac Studies & Procedures   CARDIAC CATHETERIZATION  CARDIAC CATHETERIZATION 07/27/2020  Narrative 1. Severe calcific aortic stenosis with mean gradient 67 mmHg 2. Patent coronary arteries (right dominant) with minimal irregularities but no significant stenoses 3. Essentially  normal right heart pressures with the exception of mild pulmonary HTN (mean PAP 27 mmHg)  Recommend: Continued multidisciplinary evaluation for treatment of severe symptomatic aortic stenosis.  Findings Coronary  Findings Diagnostic  Dominance: Right  Left Main Vessel is angiographically normal.  Left Anterior Descending The vessel exhibits minimal luminal irregularities.  Left Circumflex The vessel exhibits minimal luminal irregularities.  Right Coronary Artery The vessel exhibits minimal luminal irregularities.  Intervention  No interventions have been documented.     ECHOCARDIOGRAM  ECHOCARDIOGRAM COMPLETE 11/28/2021  Narrative ECHOCARDIOGRAM REPORT    Patient Name:   Megan Galvan Date of Exam: 11/28/2021 Medical Rec #:  [id number]       Height:       65.0 in Accession #:    [id number]      Weight:       214.0 lb Date of Birth:  [birth year]       BSA:          2.036 m Patient Age:    71 years        BP:           114/60 mmHg Patient Gender: F               HR:           56 bpm. Exam Location:  [facility]  Procedure: 2D Echo, Cardiac Doppler, Color Doppler and Strain Analysis  Indications:    S/P aortic valve replacement with bioprosthetic valve [Z95.3 (ICD-10-CM)  History:        Patient has prior history of Echocardiogram examinations, most recent 08/02/2020. Arrythmias:Atrial Fibrillation; Risk Factors:Hypertension and Diabetes. Aortic Valve: valve is present in the aortic position. Procedure Date: S/P aortic valve replacement with bioprosthetic valve.  Sonographer:    Louie Boston RDCS Referring Phys: [id number] Jahad Old J Lovenia Debruler  IMPRESSIONS   1. Left ventricular ejection fraction, by estimation, is 60 to 65%. The left ventricle has normal function. The left ventricle has no regional wall motion abnormalities. There is mild concentric left ventricular hypertrophy. Left ventricular diastolic parameters are consistent with Grade I diastolic dysfunction (impaired relaxation). 2. Right ventricular systolic function is normal. The right ventricular size is normal. There is normal pulmonary artery systolic pressure. 3. The mitral valve is normal in structure. Mild mitral valve  regurgitation. No evidence of mitral stenosis. 4. Aortic valve regurgitation is not visualized. There is a valve present in the aortic position. Procedure Date: S/P aortic valve replacement with bioprosthetic valve. Echo findings are consistent with normal structure and function of the aortic valve prosthesis. 5. Aortic normal DTA. 6. The inferior vena cava is normal in size with greater than 50% respiratory variability, suggesting right atrial pressure of 3 mmHg.  FINDINGS Left Ventricle: Left ventricular ejection fraction, by estimation, is 60 to 65%. The left ventricle has normal function. The left ventricle has no regional wall motion abnormalities. Global longitudinal strain performed but not reported based on interpreter judgement due to suboptimal tracking. The left ventricular internal cavity size was normal in size. There is mild concentric left ventricular hypertrophy. Left ventricular diastolic parameters are consistent with Grade I diastolic dysfunction (impaired relaxation). Indeterminate filling pressures.  Right Ventricle: The right ventricular size is normal. No increase in right ventricular wall thickness. Right ventricular systolic function is normal. There is normal pulmonary artery systolic pressure. The tricuspid regurgitant velocity is 2.42 m/s, and with an assumed right atrial pressure of 3 mmHg, the estimated right ventricular systolic pressure is 26.4 mmHg.  Left  Atrium: Left atrial size was normal in size.  Right Atrium: Right atrial size was normal in size.  Pericardium: There is no evidence of pericardial effusion.  Mitral Valve: The mitral valve is normal in structure. Mild mitral valve regurgitation. No evidence of mitral valve stenosis.  Tricuspid Valve: The tricuspid valve is normal in structure. Tricuspid valve regurgitation is mild . No evidence of tricuspid stenosis.  Aortic Valve: Aortic valve regurgitation is not visualized. Aortic valve mean gradient  measures 14.0 mmHg. Aortic valve peak gradient measures 23.0 mmHg. Aortic valve area, by VTI measures 0.98 cm. There is a valve present in the aortic position. Procedure Date: S/P aortic valve replacement with bioprosthetic valve. Echo findings are consistent with normal structure and function of the aortic valve prosthesis.  Pulmonic Valve: The pulmonic valve was normal in structure. Pulmonic valve regurgitation is not visualized. No evidence of pulmonic stenosis.  Aorta: The aortic root and ascending aorta are structurally normal, with no evidence of dilitation, normal DTA and the aortic arch was not well visualized.  Venous: The pulmonary veins were not well visualized. The inferior vena cava is normal in size with greater than 50% respiratory variability, suggesting right atrial pressure of 3 mmHg.  IAS/Shunts: No atrial level shunt detected by color flow Doppler.   LEFT VENTRICLE PLAX 2D LVIDd:         4.20 cm   Diastology LVIDs:         2.50 cm   LV e' medial:    4.90 cm/s LV PW:         1.20 cm   LV E/e' medial:  20.3 LV IVS:        1.30 cm   LV e' lateral:   6.42 cm/s LVOT diam:     1.90 cm   LV E/e' lateral: 15.5 LV SV:         64 LV SV Index:   32 LVOT Area:     2.84 cm   RIGHT VENTRICLE            IVC RV S prime:     9.79 cm/s  IVC diam: 1.70 cm TAPSE (M-mode): 1.7 cm  LEFT ATRIUM             Index        RIGHT ATRIUM           Index LA diam:        4.50 cm 2.21 cm/m   RA Area:     16.50 cm LA Vol (A2C):   72.1 ml 35.42 ml/m  RA Volume:   43.50 ml  21.37 ml/m LA Vol (A4C):   51.5 ml 25.30 ml/m LA Biplane Vol: 64.1 ml 31.49 ml/m AORTIC VALVE AV Area (Vmax):    0.94 cm AV Area (Vmean):   0.95 cm AV Area (VTI):     0.98 cm AV Vmax:           240.00 cm/s AV Vmean:          180.000 cm/s AV VTI:            0.657 m AV Peak Grad:      23.0 mmHg AV Mean Grad:      14.0 mmHg LVOT Vmax:         79.60 cm/s LVOT Vmean:        60.300 cm/s LVOT VTI:          0.227  m LVOT/AV VTI ratio: 0.35  AORTA Ao Root  diam: 3.10 cm Ao Asc diam:  3.10 cm Ao Desc diam: 2.30 cm  MITRAL VALVE                TRICUSPID VALVE MV Area (PHT): 2.59 cm     TR Peak grad:   23.4 mmHg MV Decel Time: 293 msec     TR Vmax:        242.00 cm/s MV E velocity: 99.40 cm/s MV A velocity: 121.00 cm/s  SHUNTS MV E/A ratio:  0.82         Systemic VTI:  0.23 m Systemic Diam: 1.90 cm  Norman Herrlich MD Electronically signed by Norman Herrlich MD Signature Date/Time: 11/28/2021/12:32:07 PM    Final   TEE  ECHO INTRAOPERATIVE TEE 08/10/2020  Narrative *INTRAOPERATIVE TRANSESOPHAGEAL REPORT *    Patient Name:   Margarette Asal Date of Exam: 08/10/2020 Medical Rec #:  [id number]       Height:       66.0 in Accession #:    [id number]      Weight:       206.0 lb Date of Birth:  [birth year]       BSA:          2.03 m Patient Age:    70 years        BP:           150/50 mmHg Patient Gender: F               HR:           49 bpm. Exam Location:  Inpatient  Transesophogeal exam was perform intraoperatively during surgical procedure. Patient was closely monitored under general anesthesia during the entirety of examination.  Indications:     aortic stenosis Sonographer:     Delcie Roch RDCS Performing Phys: [address] [place] Diagnosing Phys: Kipp Brood MD  PROCEDURE: Intraoperative Transesophogeal No difficulty noted with probe insertion. Complications: No known complications during this procedure. PRE-OP FINDINGS Left Ventricle: The left ventricle has normal systolic function, with an ejection fraction of 60-65%. There was moderate concentric LV hypertrophy. The LV wall thickness was 1.25-1.30 cm at end-diastole at the mid-papillary level. The ejection fraction was 60-65% using the 2D Simpson's method in the 4 and 2 chamber views. There were no regional wall motion abnormalities.  On the post-bypass exam, the LV systolic function was unchanged fro the pre-bypass  exam.   Right Ventricle: The right ventricle has normal systolic function. The cavity was normal. There is no increase in right ventricular wall thickness. The TAPSE was 2.15 cm.  Left Atrium: Left atrial size was dilated. No left atrial/left atrial appendage thrombus was detected.  Right Atrium: Right atrial size was normal in size.  Interatrial Septum: There was a small patent foramen ovale seen with color Doppler. This was located at the superior aspect of the fossa ovalis. There was left to right shunting.  Pericardium: There is no evidence of pericardial effusion.  Mitral Valve: The mitral valve is normal in structure. Mitral valve regurgitation is mild by color flow Doppler. The MR jet is centrally-directed. There is No evidence of mitral stenosis.  Tricuspid Valve: The tricuspid valve was normal in structure. Tricuspid valve regurgitation is mild by color flow Doppler. No evidence of tricuspid stenosis is present.  Aortic Valve: There was severe aortic stenosis and mild/moderate aortic insufficiency. 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 The peak velocity was 4.36  m/sec. with a peak gradient of 76 mm hg and a mean gradient of 39 mm hg. The AVA by Vmax was 0.59 cm2 and 0.56 by the VTI method. The ratio of the LVOT VTI/AV VTI was 0.147. There was an eccentric jet of aortic insufficiency. The AI PHT was 556 ms. The aortic annulus diameter measured 2.25 cm.  On the post-bypass exam, there was a bioprosthetic valve in the aortic position. The leaflets opened normally and there was no aortic insufficiency. The mean trans-aortic gradient was 6 mm hg. The ratio of the LVOT VTI/AV VTI was 0.55.    Pulmonic Valve: The pulmonic valve was normal in structure, with normal. No evidence of pumonic stenosis. Pulmonic valve regurgitation is not visualized by color flow Doppler.   Aorta: The aortic root and proximal ascending  aorta were normal in diameter with a well defined sino-tubular junction without effacement. There was intimal thickening and no protruding atheromatous disease. Aortic root: 3.06 cm STJ: 2.31 cm Proximal ascending aorta: 2.83 cm  The descending aorta measured 2.15 cm in diameter. There was mild intimal thickening.  +--------------+--------++ LEFT VENTRICLE         +--------------+--------++ PLAX 2D                +--------------+--------++ LVIDd:        4.70 cm  +--------------+--------++ LVIDs:        3.00 cm  +--------------+--------++ LVOT diam:    2.20 cm  +--------------+--------++ LV SV:        67 ml    +--------------+--------++ LV SV Index:  31.77    +--------------+--------++ LVOT Area:    3.80 cm +--------------+--------++                        +--------------+--------++  +------------------+---------++ LV Volumes (MOD)            +------------------+---------++ LV area d, A2C:   25.80 cm +------------------+---------++ LV area d, A4C:   31.20 cm +------------------+---------++ LV area s, A2C:   16.70 cm +------------------+---------++ LV area s, A4C:   17.70 cm +------------------+---------++ LV major d, A2C:  7.68 cm   +------------------+---------++ LV major d, A4C:  7.88 cm   +------------------+---------++ LV major s, A2C:  7.13 cm   +------------------+---------++ LV major s, A4C:  7.24 cm   +------------------+---------++ LV vol d, MOD A2C:73.0 ml   +------------------+---------++ LV vol d, MOD A4C:101.0 ml  +------------------+---------++ LV vol s, MOD A2C:32.9 ml   +------------------+---------++ LV vol s, MOD A4C:35.7 ml   +------------------+---------++ LV SV MOD A2C:    40.1 ml   +------------------+---------++ LV SV MOD A4C:    101.0 ml  +------------------+---------++ LV SV MOD BP:     51.6 ml    +------------------+---------++  +------------------+------------++ AORTIC VALVE                   +------------------+------------++ AV Area (Vmax):   0.59 cm     +------------------+------------++ AV Area (Vmean):               +------------------+------------++ AV Area (VTI):    0.56 cm     +------------------+------------++ AV Vmax:          436 cm/s     +------------------+------------++ AV Vmean:         225.333 cm/s +------------------+------------++ AV VTI:           1.11 m       +------------------+------------++ AV Peak Grad:     76 mmHg      +------------------+------------++  AV Mean Grad:     39 mmHg      +------------------+------------++ LVOT Vmax:        91.50 cm/s   +------------------+------------++ LVOT Vmean:       56.100 cm/s  +------------------+------------++ LVOT VTI:         0.164 m      +------------------+------------++ LVOT/AV VTI ratio:0.147        +------------------+------------++ AR PHT:           556 msec     +------------------+------------++   +--------------+-------+ SHUNTS                +--------------+-------+ Systemic VTI: 0.18 m  +--------------+-------+ Systemic Diam:2.20 cm +--------------+-------+   Kipp Brood MD Electronically signed by Kipp Brood MD Signature Date/Time: 08/10/2020/7:31:07 PM    Final    CT SCANS  CT CORONARY MORPH W/CTA COR W/SCORE 07/31/2020  Addendum 07/31/2020  4:24 PM ADDENDUM REPORT: 07/31/2020 16:21  CLINICAL DATA:  Severe Aortic Stenosis.  EXAM: Cardiac TAVR CT  TECHNIQUE: The patient was scanned on a [employer]. A 110 kV retrospective scan was triggered in the descending thoracic aorta at 111 HU's. Gantry rotation speed was 250 msecs and collimation was .6 mm. No beta blockade or nitro were given. The 3D data set was reconstructed in 5% intervals of the R-R cycle. Systolic and diastolic phases were  analyzed on a dedicated work station using MPR, MIP and VRT modes. The patient received 80 cc of contrast.  FINDINGS: Image quality: Excellent.  Noise artifact is: Limited.  Valve Morphology: The aortic valve is bicuspid with fusion of the RCC/LCC with raphe Ileene Rubens type 1). The leaflets demonstrate restricted movement in systole. The leaflets are severely calcified with bulky calcification of the raphe and NCC.  Aortic Valve Calcium score: 2167  Aortic annular dimension:  Phase assessed: 10%  Annular area: 512 mm2  Annular perimeter: 82.5 mm  Max diameter: 30.1 mm  Min diameter: 22.0 mm  Annular and subannular calcification: None.  Optimal coplanar projection: LAO 6 CAU 5  Coronary Artery Height above Annulus:  Left Main: 12.2 mm  Right Coronary: 18.8 mm  Sinus of Valsalva Measurements:  Non-coronary: 33 mm  Right-coronary: 31 mm  Left-coronary: 31 mm  Sinus of Valsalva Height:  Non-coronary: 22.9 mm  Right-coronary: 18.8 mm  Left-coronary: 17.6 mm  Sinotubular Junction: 26 mm  Ascending Thoracic Aorta: 28 mm  Coronary Arteries: Normal coronary origin. Right dominance. The study was performed without use of NTG and is insufficient for plaque evaluation. Please refer to recent cardiac catheterization for coronary assessment. No significant CAD.  Cardiac Morphology:  Right Atrium: Right atrial size is within normal limits.  Right Ventricle: The right ventricular cavity is within normal limits.  Left Atrium: Left atrial size is normal in size with no left atrial appendage filling defect. A small PFO is present.  Left Ventricle: The ventricular cavity size is within normal limits. There are no stigmata of prior infarction. There is no abnormal filling defect. Normal LV function, LVEF=69%.  Pulmonary arteries: Normal in size without proximal filling defect.  Pulmonary veins: Normal pulmonary venous drainage.  Pericardium: Normal thickness  with no significant effusion or calcium present.  Mitral Valve: The mitral valve is normal structure without significant calcification.  Extra-cardiac findings: See attached radiology report for non-cardiac structures.  IMPRESSION: 1. 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 Severe aortic stenosis (calcium score 2167). Severe bulky calcifications of the NCC and raphe.  2.  Annular measurements appropriate for 26 mm S3 TAVR (512 mm2).  3. No significant annular or subannular calcifications.  4. Sufficient coronary to annulus distance.  5. Optimal Fluoroscopic Angle for Delivery: LAO 6 CAU 5  6. Small PFO.  Gerri Spore T. Flora Lipps, MD   Electronically Signed By: Lennie Odor On: 07/31/2020 16:21  Narrative EXAM: OVER-READ INTERPRETATION  CT CHEST  The following report is an over-read performed by radiologist Dr. Trudie Reed of [facility] on 07/31/2020. This over-read does not include interpretation of cardiac or coronary anatomy or pathology. The coronary calcium score/coronary CTA interpretation by the cardiologist is attached.  COMPARISON:  No priors.  FINDINGS: Extracardiac findings will be described separately under dictation for contemporaneously obtained CTA chest, abdomen and pelvis.  IMPRESSION: Please see separate dictation for contemporaneously obtained CTA chest, abdomen and pelvis dated 07/31/2020 for full description of relevant extracardiac findings.  Electronically Signed: By: Trudie Reed M.D. On: 07/31/2020 13:32              Recent Labs: 10/16/2022: ALT 22; BUN 16; Creatinine, Ser 0.97; Hemoglobin 11.8; Platelets 199; Potassium 4.1; Sodium 138  Recent Lipid Panel    Component Value Date/Time   CHOL 124 10/16/2022 1005   TRIG 101 10/16/2022 1005   HDL 45 10/16/2022 1005   CHOLHDL 2.8 10/16/2022 1005   LDLCALC 60 10/16/2022 1005    Physical Exam:    VS:  There were no vitals taken for this visit.     Wt Readings from Last 3 Encounters:  10/16/22 213 lb 9.6 oz (96.9 kg)  09/18/22 205 lb (93 kg)  08/27/22 206 lb (93.4 kg)     GEN:  Well nourished, well developed in no acute distress HEENT: Normal NECK: No JVD; No carotid bruits LYMPHATICS: No lymphadenopathy CARDIAC: RRR, no murmurs, rubs, gallops RESPIRATORY:  Clear to auscultation without rales, wheezing or rhonchi  ABDOMEN: Soft, non-tender, non-distended MUSCULOSKELETAL:  No edema; No deformity  SKIN: Warm and dry NEUROLOGIC:  Alert and oriented x 3 PSYCHIATRIC:  Normal affect    Signed, Norman Herrlich, MD  11/18/2022 1:06 PM    [facility]

## 2022-11-18 ENCOUNTER — Ambulatory Visit: Payer: Medicare Other | Attending: Cardiology | Admitting: Cardiology

## 2022-11-18 ENCOUNTER — Encounter: Payer: Self-pay | Admitting: Cardiology

## 2022-11-18 VITALS — BP 138/70 | HR 82 | Ht 66.0 in | Wt 216.4 lb

## 2022-11-18 DIAGNOSIS — I10 Essential (primary) hypertension: Secondary | ICD-10-CM

## 2022-11-18 DIAGNOSIS — Z953 Presence of xenogenic heart valve: Secondary | ICD-10-CM | POA: Diagnosis not present

## 2022-11-18 DIAGNOSIS — E782 Mixed hyperlipidemia: Secondary | ICD-10-CM | POA: Diagnosis not present

## 2022-11-18 NOTE — Patient Instructions (Signed)

## 2022-11-19 DIAGNOSIS — M5412 Radiculopathy, cervical region: Secondary | ICD-10-CM | POA: Diagnosis not present

## 2022-11-22 ENCOUNTER — Telehealth: Payer: Self-pay

## 2022-11-22 NOTE — Telephone Encounter (Signed)
   Pre-operative Risk Assessment    Patient Name: Megan Galvan  DOB: [birth year] MRN: [id number]      Request for Surgical Clearance    Procedure:   C3-4 Anterior Cervical Fusion w/ removal of C4-5 plate  Date of Surgery:  Clearance TBD                                 Surgeon:  Dr. Tia Alert Surgeon's Group or Practice Name:  [facility] Phone number:  [phone] Fax number:  [phone]   Type of Clearance Requested:   - Medical    Type of Anesthesia:  General    Additional requests/questions:    SignedDione Housekeeper   11/22/2022, 11:15 AM

## 2022-11-22 NOTE — Telephone Encounter (Signed)
   Primary Cardiologist: None  Chart reviewed as part of pre-operative protocol coverage. Given past medical history and time since last visit, based on ACC/AHA guidelines, Megan Galvan would be at acceptable risk for the planned procedure without further cardiovascular testing.   Patient was advised that if she develops new symptoms prior to surgery to contact our office to arrange a follow-up appointment.  He verbalized understanding.  Per office protocol, aspirin may be held for 5-7 days prior to surgery. Please resume aspirin post operatively when it is felt to be safe from a bleeding standpoint.   I will route this recommendation to the requesting party via Epic fax function and remove from pre-op pool.  Please call with questions.  Levi Aland, NP-C  11/22/2022, 11:33 AM [address], [address] Office [phone] Fax [phone]

## 2022-12-03 ENCOUNTER — Other Ambulatory Visit: Payer: Self-pay | Admitting: Family Medicine

## 2022-12-03 ENCOUNTER — Ambulatory Visit (INDEPENDENT_AMBULATORY_CARE_PROVIDER_SITE_OTHER): Payer: Medicare Other

## 2022-12-03 VITALS — BP 136/82 | HR 94 | Temp 97.3°F | Ht 66.0 in | Wt 218.0 lb

## 2022-12-03 DIAGNOSIS — R208 Other disturbances of skin sensation: Secondary | ICD-10-CM

## 2022-12-03 DIAGNOSIS — L290 Pruritus ani: Secondary | ICD-10-CM

## 2022-12-03 DIAGNOSIS — R42 Dizziness and giddiness: Secondary | ICD-10-CM

## 2022-12-03 HISTORY — DX: Other disturbances of skin sensation: R20.8

## 2022-12-03 HISTORY — DX: Pruritus ani: L29.0

## 2022-12-03 MED ORDER — DILTIAZEM GEL 2 %
Freq: Two times a day (BID) | CUTANEOUS | Status: DC
Start: 2022-12-03 — End: 2022-12-05

## 2022-12-03 NOTE — Progress Notes (Signed)
Acute Office Visit  Subjective:    Patient ID: Megan Galvan, female    DOB: 03-03-1951, 72 y.o.   MRN: [id number]  Chief Complaint  Patient presents with   Rectal burning.    HPI: Patient is in today for rectal burning, itchy at times. Has tried cutivate and topical analgesic which has not helped. Has had this issue for about 5 years now.,  but it has worsened since Friday. Sometimes will bleed from area. At times it is painful to sit. Has seen Dr. Chales Abrahams in the past which is who rx'd current tx. Has difficulty getting appt with his office.   States she had lot of vaginal infections last year.  Had hemorrhoidal surgery couple years ago. Was told she cannot have any further surgeries due to fear of fecal incontinence.  Everytime, after a bowel movement, she reports itching, burning. Was noted to have redness in the area. Denies diarrhea or constipation. Uses Activia yogurt 1-2 times daily. Uses miralax as needed.  States the preparation H and Fluticasone cream did not help.   Past Medical History:  Diagnosis Date   Allergic sinusitis 08/27/2022   Atrophic vaginitis 03/31/2021   Cataract    Class 1 obesity due to excess calories with serious comorbidity and body mass index (BMI) of 33.0 to 33.9 in adult 03/28/2022   Encounter for osteoporosis screening in asymptomatic postmenopausal patient 06/28/2022   External hemorrhoids 11/12/2021   Gastro-esophageal reflux disease without esophagitis 06/03/2019   Hypertension associated with diabetes (HCC) 10/01/2019   Mixed hyperlipidemia    Mucositis (ulcerative) of vagina and vulva 06/21/2022   Neck pain, chronic 10/18/2022   Primary insomnia 06/03/2019   S/P aortic valve replacement with bioprosthetic valve 08/10/2020   21 mm Edwards Resilia Inspiria Bioprosthetic Tissue Valve  SN [id number] Model 11500A   Sleep apnea    cpap   Type 2 diabetes mellitus with diabetic polyneuropathy, without long-term current use of insulin (HCC)  06/03/2019   Vertigo 07/20/2019    Past Surgical History:  Procedure Laterality Date   ANTERIOR CERVICAL DECOMP/DISCECTOMY FUSION     x 2   AORTIC VALVE REPLACEMENT N/A 08/10/2020   Procedure: AORTIC VALVE REPLACEMENT (AVR) USING INSPIRIS VALVE SIZE ;  Surgeon: Purcell Nails, MD;  Location: [facility] OR;  Service: Open Heart Surgery;  Laterality: N/A;   BACK SURGERY  11/17/2016   L3-L5   BILATERAL CARPAL TUNNEL RELEASE     bone spur Left    left shoulder   bone spur Right    Right shoulder   CATARACT EXTRACTION Left 09/18/2021   COLONOSCOPY  08/22/2011   Moderate predominantly sigmoid diverticulosis. Small internal hemorrhoids.    COLONOSCOPY     HEMORROIDECTOMY  02/2019   NECK SURGERY     RIGHT/LEFT HEART CATH AND CORONARY ANGIOGRAPHY N/A 07/27/2020   Procedure: RIGHT/LEFT HEART CATH AND CORONARY ANGIOGRAPHY;  Surgeon: Tonny Bollman, MD;  Location: [facility] INVASIVE CV LAB;  Service: Cardiovascular;  Laterality: N/A;   TEE WITHOUT CARDIOVERSION N/A 08/10/2020   Procedure: TRANSESOPHAGEAL ECHOCARDIOGRAM (TEE);  Surgeon: Purcell Nails, MD;  Location: [facility] OR;  Service: Open Heart Surgery;  Laterality: N/A;   torn rotator cuff Right    Right shoulder   torn rotator cuff Left    left shoulder   TRIGGER FINGER RELEASE Left    thumb   TRIGGER FINGER RELEASE Right    thumb and middle finger   UPPER GASTROINTESTINAL ENDOSCOPY     WRIST SURGERY Left  torn ligament   WRIST SURGERY Right    cyst    Family History  Problem Relation Age of Onset   Diabetes Mother    Heart Problems Mother    Heart Problems Maternal Grandfather    Colon cancer Paternal Grandmother    Esophageal cancer Neg Hx    Rectal cancer Neg Hx    Stomach cancer Neg Hx    Breast cancer Neg Hx     Social History   Socioeconomic History   Marital status: Married    Spouse name: Eddie   Number of children: 2   Years of education: Not on file   Highest education level: Not on file  Occupational History    Not on file  Tobacco Use   Smoking status: Never   Smokeless tobacco: Never  Vaping Use   Vaping status: Never Used  Substance and Sexual Activity   Alcohol use: Never   Drug use: Never   Sexual activity: Not on file  Other Topics Concern   Not on file  Social History Narrative   One son lives in home, the other son lives about 3 miles away   Social Determinants of Health   Financial Resource Strain: Low Risk  (09/18/2022)   Overall Financial Resource Strain (CARDIA)    Difficulty of Paying Living Expenses: Not very hard  Food Insecurity: No Food Insecurity (09/18/2022)   Hunger Vital Sign    Worried About Running Out of Food in the Last Year: Never true    Ran Out of Food in the Last Year: Never true  Transportation Needs: No Transportation Needs (09/18/2022)   PRAPARE - Administrator, Civil Service (Medical): No    Lack of Transportation (Non-Medical): No  Physical Activity: Inactive (09/18/2022)   Exercise Vital Sign    Days of Exercise per Week: 0 days    Minutes of Exercise per Session: 0 min  Stress: No Stress Concern Present (09/18/2022)   [employer] of Occupational Health - Occupational Stress Questionnaire    Feeling of Stress : Not at all  Social Connections: Moderately Integrated (09/18/2022)   Social Connection and Isolation Panel [NHANES]    Frequency of Communication with Friends and Family: More than three times a week    Frequency of Social Gatherings with Friends and Family: More than three times a week    Attends Religious Services: More than 4 times per year    Active Member of [employer] or Organizations: No    Attends Banker Meetings: Never    Marital Status: Married  Catering manager Violence: Not At Risk (09/18/2022)   Humiliation, Afraid, Rape, and Kick questionnaire    Fear of Current or Ex-Partner: No    Emotionally Abused: No    Physically Abused: No    Sexually Abused: No    Outpatient Medications Prior to Visit   Medication Sig Dispense Refill   Accu-Chek Softclix Lancets lancets CHECK BLOOD SUGAR DAILY 100 each 2   acetaminophen (TYLENOL) 500 MG tablet Take 1,000 mg by mouth every 6 (six) hours as needed for moderate pain or headache.     aspirin EC 81 MG tablet Take 1 tablet (81 mg total) by mouth daily. Swallow whole. 90 tablet 3   Blood Glucose Monitoring Suppl (ACCU-CHEK GUIDE ME) w/Device KIT Use as directed 1 kit 0   Calcium Carb-Cholecalciferol (CALCIUM 600 + D PO) Take 2 tablets by mouth daily.     cetirizine (ZYRTEC) 10 MG tablet Take 10  mg by mouth daily.     chlorthalidone (HYGROTON) 50 MG tablet TAKE 1 TABLET BY MOUTH DAILY 100 tablet 0   diltiazem (CARDIZEM) 30 MG tablet Take 1 tablet (30 mg total) by mouth every 6 (six) hours as needed (If your heart rate is greater than 100.). 30 tablet 3   estradiol (ESTRACE) 0.1 MG/GM vaginal cream Place 1 Applicatorful vaginally 2 (two) times a week.     famotidine (PEPCID) 40 MG tablet TAKE 1 TABLET BY MOUTH AT  BEDTIME 100 tablet 1   fluconazole (DIFLUCAN) 150 MG tablet Take 150 mg by mouth every 3 (three) days.     fluticasone (CUTIVATE) 0.05 % cream Apply topically 2 (two) times daily. For 10 days to rectum 30 g 2   fluticasone (FLONASE) 50 MCG/ACT nasal spray Place 1 spray into the nose daily as needed for allergies.     glucose blood (ACCU-CHEK GUIDE) test strip 1 each by Other route daily in the afternoon. 100 each 3   hydrocortisone (ANUSOL-HC) 25 MG suppository Place 1 suppository (25 mg total) rectally 2 (two) times daily. 30 suppository 2   Melatonin 5 MG CAPS Take 5 mg by mouth at bedtime.     Multiple Vitamin (MULTIVITAMIN WITH MINERALS) TABS tablet Take 1 tablet by mouth daily.     omeprazole (PRILOSEC) 40 MG capsule TAKE 1 CAPSULE BY MOUTH TWICE  DAILY BEFORE MEALS 200 capsule 2   pioglitazone (ACTOS) 15 MG tablet Take 1 tablet (15 mg total) by mouth daily. 90 tablet 0   potassium chloride SA (KLOR-CON M) 20 MEQ tablet TAKE 2 TABLETS BY  MOUTH TWICE  DAILY 400 tablet 1   pravastatin (PRAVACHOL) 40 MG tablet TAKE 1 TABLET BY MOUTH AT  BEDTIME 100 tablet 2   PREMARIN vaginal cream APPLY TWICE A week. 42.5 g 1   traZODone (DESYREL) 100 MG tablet TAKE 1 TABLET BY MOUTH BEFORE  BEDTIME 90 tablet 3   valsartan (DIOVAN) 320 MG tablet TAKE 1 TABLET BY MOUTH DAILY 100 tablet 0   No facility-administered medications prior to visit.    Allergies  Allergen Reactions   Ace Inhibitors Cough   Farxiga [Dapagliflozin]     Yeast infections    Jardiance [Empagliflozin]     Yeast infections   Keflex [Cephalexin] Itching   Latex     Burn and itch   Metformin And Related Diarrhea   Nexium [Esomeprazole Magnesium] Diarrhea   Protonix [Pantoprazole Sodium] Other (See Comments)    Constipation   Doxycycline Rash    Review of Systems  Constitutional:  Negative for chills, fatigue and fever.  Gastrointestinal:  Negative for abdominal pain, constipation, diarrhea, nausea and vomiting.  Genitourinary:        Rectal itchy and pain       Objective:        12/03/2022    1:29 PM 11/18/2022    1:13 PM 10/16/2022   10:00 AM  Vitals with BMI  Height 5\' 6"  5\' 6"    Weight 218 lbs 216 lbs 6 oz   BMI 35.2 34.94   Systolic 136 138 161  Diastolic 82 70 76  Pulse 94 82     No data found.   Physical Exam Constitutional:      Appearance: Normal appearance.  HENT:     Head: Normocephalic and atraumatic.     Mouth/Throat:     Mouth: Mucous membranes are moist.     Pharynx: Oropharynx is clear.  Eyes:  Extraocular Movements: Extraocular movements intact.     Pupils: Pupils are equal, round, and reactive to light.  Cardiovascular:     Rate and Rhythm: Normal rate and regular rhythm.     Heart sounds: Murmur heard.  Pulmonary:     Effort: Pulmonary effort is normal.     Breath sounds: Normal breath sounds.  Genitourinary:    Comments: Mild erythema around the rectal region. Tenderness to touch noted on the right side of the  anal orifice No hemorrhoids noted per se  Musculoskeletal:        General: Normal range of motion.  Skin:    General: Skin is warm and dry.  Neurological:     General: No focal deficit present.     Mental Status: She is alert and oriented to person, place, and time. Mental status is at baseline.  Psychiatric:        Mood and Affect: Mood normal.        Behavior: Behavior normal.     Health Maintenance Due  Topic Date Due   Hepatitis C Screening  Never done   Zoster Vaccines- Shingrix (1 of 2) 05/28/1970   OPHTHALMOLOGY EXAM  03/21/2022   COVID-19 Vaccine (5 - 2023-24 season) 06/27/2022   INFLUENZA VACCINE  11/28/2022   Colonoscopy  02/25/2023    There are no preventive care reminders to display for this patient.   Lab Results  Component Value Date   TSH 2.360 09/04/2021   Lab Results  Component Value Date   WBC 6.0 10/16/2022   HGB 11.8 10/16/2022   HCT 35.3 10/16/2022   MCV 100 (H) 10/16/2022   PLT 199 10/16/2022   Lab Results  Component Value Date   NA 138 10/16/2022   K 4.1 10/16/2022   CO2 29 10/16/2022   GLUCOSE 138 (H) 10/16/2022   BUN 16 10/16/2022   CREATININE 0.97 10/16/2022   BILITOT 0.5 10/16/2022   ALKPHOS 64 10/16/2022   AST 27 10/16/2022   ALT 22 10/16/2022   PROT 7.1 10/16/2022   ALBUMIN 4.3 10/16/2022   CALCIUM 9.7 10/16/2022   ANIONGAP 10 08/13/2020   EGFR 62 10/16/2022   Lab Results  Component Value Date   CHOL 124 10/16/2022   Lab Results  Component Value Date   HDL 45 10/16/2022   Lab Results  Component Value Date   LDLCALC 60 10/16/2022   Lab Results  Component Value Date   TRIG 101 10/16/2022   Lab Results  Component Value Date   CHOLHDL 2.8 10/16/2022   Lab Results  Component Value Date   HGBA1C 7.0 (H) 10/16/2022       Assessment & Plan:  Rectal burning Assessment & Plan: Megan Galvan presents with worsening of her chronic problem of rectal burning and itching. On exam, appears to be rectal fissure, with  tenderness at the rectal orifice.   PLAN: 1. Sent Diltiazem gel to her [facility] in [place]. 2. If for any reason, she is unable to get it, or if it pharmacy does not carry it, filled an paper form for [facility] for NIFEDIPINE 5% CREAM for compounding.  3. She should use EITHER Diltiazem OR Nifedipine depending on the availability. 4. Recommend to continue hydration, preparation H as needed, fiber and miralax. 5. Follow up as needed  Orders: -     diltiazem  Rectal itching -     diltiazem     Meds ordered this encounter  Medications   diltiazem 2 %  gel    No orders of the defined types were placed in this encounter.    Follow-up: No follow-ups on file.  An After Visit Summary was printed and given to the patient.  Windell Moment, MD [facility] [phone]

## 2022-12-03 NOTE — Assessment & Plan Note (Signed)
Megan Galvan presents with worsening of her chronic problem of rectal burning and itching. On exam, appears to be rectal fissure, with tenderness at the rectal orifice.   PLAN: 1. Sent Diltiazem gel to her [facility] in [place]. 2. If for any reason, she is unable to get it, or if it pharmacy does not carry it, filled an paper form for [facility] for NIFEDIPINE 5% CREAM for compounding.  3. She should use EITHER Diltiazem OR Nifedipine depending on the availability. 4. Recommend to continue hydration, preparation H as needed, fiber and miralax. 5. Follow up as needed

## 2022-12-05 ENCOUNTER — Other Ambulatory Visit: Payer: Self-pay

## 2022-12-05 MED ORDER — DILTIAZEM GEL 2 %: 1.0000 | Freq: Two times a day (BID) | CUTANEOUS | 0 refills | Status: DC

## 2022-12-05 MED ORDER — DILTIAZEM GEL 2 %: 1.0000 | Freq: Two times a day (BID) | CUTANEOUS | Status: DC

## 2022-12-05 NOTE — Telephone Encounter (Signed)
Patient called x 2 stating she did not receive her medication (diltiazem gel).  Medication resent.

## 2022-12-11 DIAGNOSIS — G4733 Obstructive sleep apnea (adult) (pediatric): Secondary | ICD-10-CM | POA: Diagnosis not present

## 2023-01-15 ENCOUNTER — Encounter: Payer: Self-pay | Admitting: Gastroenterology

## 2023-01-18 ENCOUNTER — Other Ambulatory Visit: Payer: Self-pay | Admitting: Family Medicine

## 2023-01-18 DIAGNOSIS — E782 Mixed hyperlipidemia: Secondary | ICD-10-CM

## 2023-01-21 DIAGNOSIS — G4733 Obstructive sleep apnea (adult) (pediatric): Secondary | ICD-10-CM | POA: Diagnosis not present

## 2023-01-21 DIAGNOSIS — Z9889 Other specified postprocedural states: Secondary | ICD-10-CM | POA: Diagnosis not present

## 2023-01-21 DIAGNOSIS — R490 Dysphonia: Secondary | ICD-10-CM | POA: Diagnosis not present

## 2023-01-23 DIAGNOSIS — N3090 Cystitis, unspecified without hematuria: Secondary | ICD-10-CM | POA: Diagnosis not present

## 2023-01-23 DIAGNOSIS — R300 Dysuria: Secondary | ICD-10-CM | POA: Diagnosis not present

## 2023-01-29 ENCOUNTER — Telehealth: Payer: Self-pay | Admitting: Cardiology

## 2023-01-29 NOTE — Telephone Encounter (Signed)
Follow Up:     Patient would like for the APP or nurse to please call her. She said she is confused about her clearance for surgery. She said it read like she should mot have it and  later it seems like it was saying it was alright to have surgery,

## 2023-01-29 NOTE — Telephone Encounter (Signed)
I spoke with patient and went over preop clearance. She voiced understanding.

## 2023-02-04 ENCOUNTER — Encounter: Payer: Self-pay | Admitting: Family Medicine

## 2023-02-04 ENCOUNTER — Ambulatory Visit (INDEPENDENT_AMBULATORY_CARE_PROVIDER_SITE_OTHER): Payer: Medicare Other | Admitting: Family Medicine

## 2023-02-04 VITALS — BP 130/76 | HR 78 | Temp 97.8°F | Ht 67.0 in | Wt 217.0 lb

## 2023-02-04 DIAGNOSIS — I10 Essential (primary) hypertension: Secondary | ICD-10-CM

## 2023-02-04 DIAGNOSIS — R202 Paresthesia of skin: Secondary | ICD-10-CM | POA: Diagnosis not present

## 2023-02-04 DIAGNOSIS — I152 Hypertension secondary to endocrine disorders: Secondary | ICD-10-CM

## 2023-02-04 DIAGNOSIS — M79642 Pain in left hand: Secondary | ICD-10-CM | POA: Diagnosis not present

## 2023-02-04 DIAGNOSIS — E66811 Obesity, class 1: Secondary | ICD-10-CM

## 2023-02-04 DIAGNOSIS — E1142 Type 2 diabetes mellitus with diabetic polyneuropathy: Secondary | ICD-10-CM

## 2023-02-04 DIAGNOSIS — M79641 Pain in right hand: Secondary | ICD-10-CM | POA: Diagnosis not present

## 2023-02-04 DIAGNOSIS — Z6833 Body mass index (BMI) 33.0-33.9, adult: Secondary | ICD-10-CM

## 2023-02-04 DIAGNOSIS — E782 Mixed hyperlipidemia: Secondary | ICD-10-CM

## 2023-02-04 DIAGNOSIS — E6609 Other obesity due to excess calories: Secondary | ICD-10-CM

## 2023-02-04 DIAGNOSIS — E1159 Type 2 diabetes mellitus with other circulatory complications: Secondary | ICD-10-CM | POA: Diagnosis not present

## 2023-02-04 LAB — HM DIABETES EYE EXAM

## 2023-02-04 MED ORDER — MELOXICAM 15 MG PO TABS
15.0000 mg | ORAL_TABLET | Freq: Every day | ORAL | 2 refills | Status: DC
Start: 2023-02-04 — End: 2023-02-20

## 2023-02-04 NOTE — Assessment & Plan Note (Addendum)
Control: improved.  Recommend check sugars fasting daily. Recommend check feet daily. Recommend annual eye exams. Medicines: recommend increase actos 30 mg daily.  Continue to work on eating a healthy diet and exercise.  Labs drawn today.

## 2023-02-04 NOTE — Assessment & Plan Note (Signed)
Well controlled.  No changes to medicines. Pravastatin 40 mg daily Continue to work on eating a healthy diet and exercise.  Labs drawn today.

## 2023-02-04 NOTE — Patient Instructions (Addendum)
Recommend carpal tunnel braces.   Start meloxicam 15 mg daily.

## 2023-02-04 NOTE — Progress Notes (Unsigned)
Subjective:  Patient ID: Megan Galvan, female    DOB: Feb 20, 1951  Age: 72 y.o. MRN: [id number]  Chief Complaint  Patient presents with   Medical Management of Chronic Issues    HPI Diabetes: She presents for her follow-up diabetic visit. She has type 2 diabetes mellitus.  Complications: diabetic polyneuropathy. Glucose checking:  daily  Glucose logs: Blood sugar range 140-150s  Hypoglycemia:none Most recent A1C: 7.0 Current medications: Actos 15 mg daily.   Last Eye Exam: 10/2022.   Foot checks: daily. Diet: trying. Eating more fruit. Avoiding carbs/sweets.  Exercise: none.   Hyperlipidemia: Current medications:  Pravastatin 40 mg daily    Hypertension: Current medications:  Chlorthalidone 50 mg daily, Valsartan 320 mg daily, Diltiazem 30 mg prn    GERD: well controlled on famotidine and omeprazole twice daily.    OSA: uses cpap. Compliant and she feels like it helps.        10/16/2022    9:58 AM 09/18/2022   11:03 AM 08/27/2022    3:28 PM 06/28/2022   10:15 AM 03/28/2022   11:45 AM  Depression screen PHQ 2/9  Decreased Interest 0 0 0 0 0  Down, Depressed, Hopeless 0 0 0 0 0  PHQ - 2 Score 0 0 0 0 0  Altered sleeping 0      Tired, decreased energy 1      Change in appetite 0      Feeling bad or failure about yourself  0      Trouble concentrating 0      Moving slowly or fidgety/restless 0      Suicidal thoughts 0      PHQ-9 Score 1      Difficult doing work/chores Not difficult at all            10/16/2022    9:47 AM  Fall Risk   Falls in the past year? 0  Number falls in past yr: 0  Injury with Fall? 0  Risk for fall due to : No Fall Risks  Follow up Falls evaluation completed;Falls prevention discussed    Patient Care Team: Blane Ohara, MD as PCP - General (Family Medicine) Zettie Pho, [facility] (Inactive) as Pharmacist (Pharmacist) Lynann Bologna, MD as Consulting Physician (Gastroenterology) Baldo Daub, MD as Consulting Physician  (Cardiology) Birdie Sons, OD (Ophthalmology)   Review of Systems  Constitutional:  Positive for fatigue. Negative for chills and fever.  HENT:  Negative for congestion, ear pain, rhinorrhea and sore throat.   Respiratory:  Negative for cough and shortness of breath.   Cardiovascular:  Negative for chest pain.  Gastrointestinal:  Positive for rectal pain (using medicine.). Negative for abdominal pain, constipation, diarrhea, nausea and vomiting.  Genitourinary:  Negative for dysuria and urgency.  Musculoskeletal:  Positive for arthralgias (fingers and feet.) and neck pain (awaiting to have surgery.). Negative for back pain and myalgias.  Neurological:  Negative for dizziness, weakness, light-headedness and headaches.  Hematological:  Does not bruise/bleed easily.  Psychiatric/Behavioral:  Negative for dysphoric mood and sleep disturbance. The patient is not nervous/anxious.     Current Outpatient Medications on File Prior to Visit  Medication Sig Dispense Refill   ACCU-CHEK GUIDE test strip USE 1 STRIP WITH METER DAILY IN  THE AFTERNOON 100 strip 2   Accu-Chek Softclix Lancets lancets CHECK BLOOD SUGAR DAILY 100 each 2   acetaminophen (TYLENOL) 500 MG tablet Take 1,000 mg by mouth 2 (two) times daily.     aspirin  EC 81 MG tablet Take 1 tablet (81 mg total) by mouth daily. Swallow whole. 90 tablet 3   Blood Glucose Monitoring Suppl (ACCU-CHEK GUIDE ME) w/Device KIT Use as directed 1 kit 0   Calcium Carb-Cholecalciferol (CALCIUM 600 + D PO) Take 2 tablets by mouth daily.     cetirizine (ZYRTEC) 10 MG tablet Take 10 mg by mouth daily.     chlorthalidone (HYGROTON) 50 MG tablet TAKE 1 TABLET BY MOUTH DAILY 100 tablet 0   diltiazem (CARDIZEM) 30 MG tablet Take 1 tablet (30 mg total) by mouth every 6 (six) hours as needed (If your heart rate is greater than 100.). (Patient not taking: Reported on 02/06/2023) 30 tablet 3   famotidine (PEPCID) 40 MG tablet TAKE 1 TABLET BY MOUTH AT  BEDTIME  100 tablet 1   fluticasone (FLONASE) 50 MCG/ACT nasal spray Place 1 spray into the nose daily as needed for allergies.     hydrocortisone (ANUSOL-HC) 25 MG suppository Place 1 suppository (25 mg total) rectally 2 (two) times daily. (Patient taking differently: Place 25 mg rectally 2 (two) times daily as needed for hemorrhoids.) 30 suppository 2   Melatonin 5 MG CAPS Take 5 mg by mouth at bedtime.     Multiple Vitamin (MULTIVITAMIN WITH MINERALS) TABS tablet Take 1 tablet by mouth daily.     omeprazole (PRILOSEC) 40 MG capsule TAKE 1 CAPSULE BY MOUTH TWICE  DAILY BEFORE MEALS 200 capsule 2   potassium chloride SA (KLOR-CON M) 20 MEQ tablet TAKE 2 TABLETS BY MOUTH TWICE  DAILY 400 tablet 1   pravastatin (PRAVACHOL) 40 MG tablet TAKE 1 TABLET BY MOUTH AT  BEDTIME 100 tablet 2   PREMARIN vaginal cream APPLY TWICE A week. 42.5 g 1   traZODone (DESYREL) 100 MG tablet TAKE 1 TABLET BY MOUTH BEFORE  BEDTIME 90 tablet 3   valsartan (DIOVAN) 320 MG tablet TAKE 1 TABLET BY MOUTH DAILY 100 tablet 0   No current facility-administered medications on file prior to visit.   Past Medical History:  Diagnosis Date   Allergic sinusitis 08/27/2022   Atrophic vaginitis 03/31/2021   Cataract    Class 1 obesity due to excess calories with serious comorbidity and body mass index (BMI) of 33.0 to 33.9 in adult 03/28/2022   Encounter for osteoporosis screening in asymptomatic postmenopausal patient 06/28/2022   External hemorrhoids 11/12/2021   Gastro-esophageal reflux disease without esophagitis 06/03/2019   Hypertension associated with diabetes (HCC) 10/01/2019   Mixed hyperlipidemia    Mucositis (ulcerative) of vagina and vulva 06/21/2022   Neck pain, chronic 10/18/2022   Primary insomnia 06/03/2019   S/P aortic valve replacement with bioprosthetic valve 08/10/2020   21 mm Edwards Resilia Inspiria Bioprosthetic Tissue Valve  SN [id number] Model [id number]   Sleep apnea    cpap   Type 2 diabetes mellitus with  diabetic polyneuropathy, without long-term current use of insulin (HCC) 06/03/2019   Vertigo 07/20/2019   Past Surgical History:  Procedure Laterality Date   ANTERIOR CERVICAL DECOMP/DISCECTOMY FUSION     x 2   AORTIC VALVE REPLACEMENT N/A 08/10/2020   Procedure: AORTIC VALVE REPLACEMENT (AVR) USING INSPIRIS VALVE SIZE ;  Surgeon: Purcell Nails, MD;  Location: [facility] OR;  Service: Open Heart Surgery;  Laterality: N/A;   BACK SURGERY  11/17/2016   L3-L5   BILATERAL CARPAL TUNNEL RELEASE     bone spur Left    left shoulder   bone spur Right    Right shoulder  CATARACT EXTRACTION Left 09/18/2021   COLONOSCOPY  08/22/2011   Moderate predominantly sigmoid diverticulosis. Small internal hemorrhoids.    COLONOSCOPY     HEMORROIDECTOMY  02/2019   NECK SURGERY     RIGHT/LEFT HEART CATH AND CORONARY ANGIOGRAPHY N/A 07/27/2020   Procedure: RIGHT/LEFT HEART CATH AND CORONARY ANGIOGRAPHY;  Surgeon: Tonny Bollman, MD;  Location: [facility] INVASIVE CV LAB;  Service: Cardiovascular;  Laterality: N/A;   TEE WITHOUT CARDIOVERSION N/A 08/10/2020   Procedure: TRANSESOPHAGEAL ECHOCARDIOGRAM (TEE);  Surgeon: Purcell Nails, MD;  Location: [facility] OR;  Service: Open Heart Surgery;  Laterality: N/A;   torn rotator cuff Right    Right shoulder   torn rotator cuff Left    left shoulder   TRIGGER FINGER RELEASE Left    thumb   TRIGGER FINGER RELEASE Right    thumb and middle finger   UPPER GASTROINTESTINAL ENDOSCOPY     WRIST SURGERY Left    torn ligament   WRIST SURGERY Right    cyst    Family History  Problem Relation Age of Onset   Diabetes Mother    Heart Problems Mother    Heart Problems Maternal Grandfather    Colon cancer Paternal Grandmother    Esophageal cancer Neg Hx    Rectal cancer Neg Hx    Stomach cancer Neg Hx    Breast cancer Neg Hx    Social History   Socioeconomic History   Marital status: Married    Spouse name: Eddie   Number of children: 2   Years of education: Not on  file   Highest education level: Not on file  Occupational History   Not on file  Tobacco Use   Smoking status: Never   Smokeless tobacco: Never  Vaping Use   Vaping status: Never Used  Substance and Sexual Activity   Alcohol use: Never   Drug use: Never   Sexual activity: Not on file  Other Topics Concern   Not on file  Social History Narrative   One son lives in home, the other son lives about 3 miles away   Social Determinants of Health   Financial Resource Strain: Low Risk  (09/18/2022)   Overall Financial Resource Strain (CARDIA)    Difficulty of Paying Living Expenses: Not very hard  Food Insecurity: No Food Insecurity (09/18/2022)   Hunger Vital Sign    Worried About Running Out of Food in the Last Year: Never true    Ran Out of Food in the Last Year: Never true  Transportation Needs: No Transportation Needs (09/18/2022)   PRAPARE - Administrator, Civil Service (Medical): No    Lack of Transportation (Non-Medical): No  Physical Activity: Inactive (09/18/2022)   Exercise Vital Sign    Days of Exercise per Week: 0 days    Minutes of Exercise per Session: 0 min  Stress: No Stress Concern Present (09/18/2022)   [employer] of Occupational Health - Occupational Stress Questionnaire    Feeling of Stress : Not at all  Social Connections: Moderately Integrated (09/18/2022)   Social Connection and Isolation Panel [NHANES]    Frequency of Communication with Friends and Family: More than three times a week    Frequency of Social Gatherings with Friends and Family: More than three times a week    Attends Religious Services: More than 4 times per year    Active Member of [employer] or Organizations: No    Attends Banker Meetings: Never    Marital  Status: Married    Objective:  BP 130/76   Pulse 78   Temp 97.8 F (36.6 C)   Ht 5\' 7"  (1.702 m)   Wt 217 lb (98.4 kg)   SpO2 97%   BMI 33.99 kg/m      02/04/2023    9:03 AM 12/03/2022    1:29 PM  11/18/2022    1:13 PM  BP/Weight  Systolic BP 130 136 138  Diastolic BP 76 82 70  Wt. (Lbs) 217 218 216.4  BMI 33.99 kg/m2 35.19 kg/m2 34.93 kg/m2    Physical Exam Vitals reviewed.  Constitutional:      Appearance: Normal appearance. She is normal weight.  Neck:     Vascular: No carotid bruit.  Cardiovascular:     Rate and Rhythm: Normal rate and regular rhythm.     Heart sounds: Normal heart sounds.  Pulmonary:     Effort: Pulmonary effort is normal. No respiratory distress.     Breath sounds: Normal breath sounds.  Abdominal:     General: Abdomen is flat. Bowel sounds are normal.     Palpations: Abdomen is soft.     Tenderness: There is no abdominal tenderness.  Neurological:     Mental Status: She is alert and oriented to person, place, and time.  Psychiatric:        Mood and Affect: Mood normal.        Behavior: Behavior normal.     Diabetic Foot Exam - Simple   No data filed      Lab Results  Component Value Date   WBC 4.7 02/04/2023   HGB 12.2 02/04/2023   HCT 37.6 02/04/2023   PLT 222 02/04/2023   GLUCOSE 135 (H) 02/04/2023   CHOL 132 02/04/2023   TRIG 98 02/04/2023   HDL 44 02/04/2023   LDLCALC 70 02/04/2023   ALT 25 02/04/2023   AST 31 02/04/2023   NA 139 02/04/2023   K 3.8 02/04/2023   CL 97 02/04/2023   CREATININE 0.89 02/04/2023   BUN 15 02/04/2023   CO2 29 02/04/2023   TSH 2.360 09/04/2021   INR 1.5 (H) 08/10/2020   HGBA1C 7.1 (H) 02/04/2023   MICROALBUR 10 12/13/2020      Assessment & Plan:    Hypertension associated with diabetes (HCC) Assessment & Plan: Well controlled.  No changes to medicines. Continue Chlorthalidone 50 mg daily, Valsartan 320 mg daily.   Continue to work on eating a healthy diet and exercise.  Labs drawn today.    Orders: -     CBC with Differential/Platelet -     Comprehensive metabolic panel  Type 2 diabetes mellitus with diabetic polyneuropathy, without long-term current use of insulin  (HCC) Assessment & Plan: Control: improved.  Recommend check sugars fasting daily. Recommend check feet daily. Recommend annual eye exams. Medicines: continue actos 15 mg daily.  Continue to work on eating a healthy diet and exercise.  Labs drawn today.     Orders: -     Hemoglobin A1c -     Microalbumin / creatinine urine ratio  Mixed hyperlipidemia Assessment & Plan: Well controlled.  No changes to medicines. Pravastatin 40 mg daily Continue to work on eating a healthy diet and exercise.  Labs drawn today.    Orders: -     Lipid panel  Paresthesias -     B12 and Folate Panel -     Methylmalonic acid, serum -     T4, free -  VITAMIN D 25 Hydroxy (Vit-D Deficiency, Fractures)  Pain in both hands -     T4, free -     TSH -     Rheumatoid factor -     CYCLIC CITRUL PEPTIDE ANTIBODY, IGG/IGA -     Sedimentation rate -     C-reactive protein -     Meloxicam; Take 1 tablet (15 mg total) by mouth daily. (Patient not taking: Reported on 02/06/2023)  Dispense: 30 tablet; Refill: 2     Meds ordered this encounter  Medications   meloxicam (MOBIC) 15 MG tablet    Sig: Take 1 tablet (15 mg total) by mouth daily.    Dispense:  30 tablet    Refill:  2    Orders Placed This Encounter  Procedures   CBC with Differential/Platelet   Comprehensive metabolic panel   Hemoglobin A1c   Lipid panel   Microalbumin / creatinine urine ratio   B12 and Folate Panel   Methylmalonic acid, serum   T4, free   TSH   VITAMIN D 25 Hydroxy (Vit-D Deficiency, Fractures)   Rheumatoid factor   CYCLIC CITRUL PEPTIDE ANTIBODY, IGG/IGA   Sedimentation rate   C-reactive protein     Follow-up: No follow-ups on file.   I,Brinly Maietta I Leal-Borjas,acting as a scribe for Blane Ohara, MD.,have documented all relevant documentation on the behalf of Blane Ohara, MD,as directed by  Blane Ohara, MD while in the presence of Blane Ohara, MD.   An After Visit Summary was printed and given to the  patient.  Blane Ohara, MD [facility] [phone]

## 2023-02-04 NOTE — Assessment & Plan Note (Addendum)
Well controlled.  No changes to medicines. Continue Chlorthalidone 50 mg daily, Valsartan 320 mg daily, Diltiazem 30 mg prn  Continue to work on eating a healthy diet and exercise.  Labs drawn today.

## 2023-02-05 LAB — LIPID PANEL
Chol/HDL Ratio: 3 {ratio} (ref 0.0–4.4)
Cholesterol, Total: 132 mg/dL (ref 100–199)
HDL: 44 mg/dL (ref >39)
LDL Chol Calc (NIH): 70 mg/dL (ref 0–99)
Triglycerides: 98 mg/dL (ref 0–149)
VLDL Cholesterol Cal: 18 mg/dL (ref 5–40)

## 2023-02-05 LAB — COMPREHENSIVE METABOLIC PANEL
ALT: 25 [IU]/L (ref 0–32)
AST: 31 [IU]/L (ref 0–40)
Albumin: 4.5 g/dL (ref 3.8–4.8)
Alkaline Phosphatase: 62 [IU]/L (ref 44–121)
BUN/Creatinine Ratio: 17 (ref 12–28)
BUN: 15 mg/dL (ref 8–27)
Bilirubin Total: 0.4 mg/dL (ref 0.0–1.2)
CO2: 29 mmol/L (ref 20–29)
Calcium: 9.6 mg/dL (ref 8.7–10.3)
Chloride: 97 mmol/L (ref 96–106)
Creatinine, Ser: 0.89 mg/dL (ref 0.57–1.00)
Globulin, Total: 2.8 g/dL (ref 1.5–4.5)
Glucose: 135 mg/dL — ABNORMAL HIGH (ref 70–99)
Potassium: 3.8 mmol/L (ref 3.5–5.2)
Sodium: 139 mmol/L (ref 134–144)
Total Protein: 7.3 g/dL (ref 6.0–8.5)
eGFR: 69 mL/min/{1.73_m2} (ref >59)

## 2023-02-05 LAB — CBC WITH DIFFERENTIAL/PLATELET
Basophils Absolute: 0.1 10*3/uL (ref 0.0–0.2)
Basos: 1 %
EOS (ABSOLUTE): 0.1 10*3/uL (ref 0.0–0.4)
Eos: 3 %
Hematocrit: 37.6 % (ref 34.0–46.6)
Hemoglobin: 12.2 g/dL (ref 11.1–15.9)
Immature Grans (Abs): 0 10*3/uL (ref 0.0–0.1)
Immature Granulocytes: 0 %
Lymphocytes Absolute: 2.2 10*3/uL (ref 0.7–3.1)
Lymphs: 46 %
MCH: 32.7 pg (ref 26.6–33.0)
MCHC: 32.4 g/dL (ref 31.5–35.7)
MCV: 101 fL — ABNORMAL HIGH (ref 79–97)
Monocytes Absolute: 0.7 10*3/uL (ref 0.1–0.9)
Monocytes: 16 %
Neutrophils Absolute: 1.6 10*3/uL (ref 1.4–7.0)
Neutrophils: 34 %
Platelets: 222 10*3/uL (ref 150–450)
RBC: 3.73 x10E6/uL — ABNORMAL LOW (ref 3.77–5.28)
RDW: 12.3 % (ref 11.7–15.4)
WBC: 4.7 10*3/uL (ref 3.4–10.8)

## 2023-02-05 LAB — HEMOGLOBIN A1C
Est. average glucose Bld gHb Est-mCnc: 157 mg/dL
Hgb A1c MFr Bld: 7.1 % — ABNORMAL HIGH (ref 4.8–5.6)

## 2023-02-06 ENCOUNTER — Other Ambulatory Visit: Payer: Self-pay

## 2023-02-06 ENCOUNTER — Other Ambulatory Visit: Payer: Self-pay | Admitting: Neurological Surgery

## 2023-02-06 MED ORDER — PIOGLITAZONE HCL 30 MG PO TABS: 30.0000 mg | ORAL_TABLET | Freq: Every day | ORAL | 0 refills | Status: DC

## 2023-02-08 DIAGNOSIS — M79641 Pain in right hand: Secondary | ICD-10-CM

## 2023-02-08 HISTORY — DX: Pain in right hand: M79.641

## 2023-02-08 NOTE — Assessment & Plan Note (Signed)
Check labs 

## 2023-02-08 NOTE — Assessment & Plan Note (Signed)
Check labs Recommend carpal tunnel braces.  Start meloxicam 15 mg daily.

## 2023-02-09 NOTE — Assessment & Plan Note (Signed)
Recommend continue to work on eating healthy diet and exercise. Comorbidities: diabetes and hyperlipidemia.

## 2023-02-10 LAB — VITAMIN D 25 HYDROXY (VIT D DEFICIENCY, FRACTURES): Vit D, 25-Hydroxy: 60.2 ng/mL (ref 30.0–100.0)

## 2023-02-10 LAB — TSH: TSH: 2.1 u[IU]/mL (ref 0.450–4.500)

## 2023-02-10 LAB — RHEUMATOID FACTOR: Rheumatoid fact SerPl-aCnc: <10 [IU]/mL (ref <14.0)

## 2023-02-10 LAB — B12 AND FOLATE PANEL
Folate: >20 ng/mL (ref >3.0)
Vitamin B-12: 488 pg/mL (ref 232–1245)

## 2023-02-10 LAB — MICROALBUMIN / CREATININE URINE RATIO
Creatinine, Urine: 24.3 mg/dL
Microalb/Creat Ratio: <12 mg/g{creat} (ref 0–29)
Microalbumin, Urine: <3 ug/mL

## 2023-02-10 LAB — CYCLIC CITRUL PEPTIDE ANTIBODY, IGG/IGA: Cyclic Citrullin Peptide Ab: 10 U (ref 0–19)

## 2023-02-10 LAB — METHYLMALONIC ACID, SERUM: Methylmalonic Acid: 270 nmol/L (ref 0–378)

## 2023-02-10 LAB — C-REACTIVE PROTEIN: CRP: 1 mg/L (ref 0–10)

## 2023-02-10 LAB — T4, FREE: Free T4: 1.18 ng/dL (ref 0.82–1.77)

## 2023-02-10 LAB — SEDIMENTATION RATE: Sed Rate: 9 mm/h (ref 0–40)

## 2023-02-12 NOTE — Pre-Procedure Instructions (Signed)
Surgical Instructions   Your procedure is scheduled on February 19, 2023. Report to [facility] Main Entrance "A" at 7:45 A.M., then check in with the Admitting office. Any questions or running late day of surgery: call [phone]  Questions prior to your surgery date: call [phone], Monday-Friday, 8am-4pm. If you experience any cold or flu symptoms such as cough, fever, chills, shortness of breath, etc. between now and your scheduled surgery, please notify us at the above number.     Remember:  Do not eat or drink after midnight the night before your surgery    Take these medicines the morning of surgery with A SIP OF WATER: acetaminophen (TYLENOL)  cetirizine (ZYRTEC)  omeprazole (PRILOSEC)    May take these medicines IF NEEDED: fluticasone (FLONASE) nasal spray  hydrocortisone (ANUSOL-HC) suppository    Follow your surgeon's instructions on when to stop Asprin.  If no instructions were given by your surgeon then you will need to call the office to get those instructions.     One week prior to surgery, STOP taking any Aleve, Naproxen, Ibuprofen, Motrin, Advil, Goody's, BC's, all herbal medications, fish oil, and non-prescription vitamins.   WHAT DO I DO ABOUT MY DIABETES MEDICATION?   Do not take pioglitazone (ACTOS) the morning of surgery.   HOW TO MANAGE YOUR DIABETES BEFORE AND AFTER SURGERY  Why is it important to control my blood sugar before and after surgery? Improving blood sugar levels before and after surgery helps healing and can limit problems. A way of improving blood sugar control is eating a healthy diet by:  Eating less sugar and carbohydrates  Increasing activity/exercise  Talking with your doctor about reaching your blood sugar goals High blood sugars (greater than 180 mg/dL) can raise your risk of infections and slow your recovery, so you will need to focus on controlling your diabetes during the weeks before surgery. Make sure that the doctor  who takes care of your diabetes knows about your planned surgery including the date and location.  How do I manage my blood sugar before surgery? Check your blood sugar at least 4 times a day, starting 2 days before surgery, to make sure that the level is not too high or low.  Check your blood sugar the morning of your surgery when you wake up and every 2 hours until you get to the Short Stay unit.  If your blood sugar is less than 70 mg/dL, you will need to treat for low blood sugar: Do not take insulin. Treat a low blood sugar (less than 70 mg/dL) with  cup of clear juice (cranberry or apple), 4 glucose tablets, OR glucose gel. Recheck blood sugar in 15 minutes after treatment (to make sure it is greater than 70 mg/dL). If your blood sugar is not greater than 70 mg/dL on recheck, call [phone] for further instructions. Report your blood sugar to the short stay nurse when you get to Short Stay.  If you are admitted to the hospital after surgery: Your blood sugar will be checked by the staff and you will probably be given insulin after surgery (instead of oral diabetes medicines) to make sure you have good blood sugar levels. The goal for blood sugar control after surgery is 80-180 mg/dL.                      Do NOT Smoke (Tobacco/Vaping) for 24 hours prior to your procedure.  If you use a CPAP at night, you may bring  your mask/headgear for your overnight stay.   You will be asked to remove any contacts, glasses, piercing's, hearing aid's, dentures/partials prior to surgery. Please bring cases for these items if needed.    Patients discharged the day of surgery will not be allowed to drive home, and someone needs to stay with them for 24 hours.  SURGICAL WAITING ROOM VISITATION Patients may have no more than 2 support people in the waiting area - these visitors may rotate.   Pre-op nurse will coordinate an appropriate time for 1 ADULT support person, who may not rotate, to accompany  patient in pre-op.  Children under the age of 77 must have an adult with them who is not the patient and must remain in the main waiting area with an adult.  If the patient needs to stay at the hospital during part of their recovery, the visitor guidelines for inpatient rooms apply.  Please refer to the [facility] website for the visitor guidelines for any additional information.   If you received a COVID test during your pre-op visit  it is requested that you wear a mask when out in public, stay away from anyone that may not be feeling well and notify your surgeon if you develop symptoms. If you have been in contact with anyone that has tested positive in the last 10 days please notify you surgeon.      Pre-operative 5 CHG Bathing Instructions   You can play a key role in reducing the risk of infection after surgery. Your skin needs to be as free of germs as possible. You can reduce the number of germs on your skin by washing with CHG (chlorhexidine gluconate) soap before surgery. CHG is an antiseptic soap that kills germs and continues to kill germs even after washing.   DO NOT use if you have an allergy to chlorhexidine/CHG or antibacterial soaps. If your skin becomes reddened or irritated, stop using the CHG and notify one of our RNs at [phone].   Please shower with the CHG soap starting 4 days before surgery using the following schedule:     Please keep in mind the following:  DO NOT shave, including legs and underarms, starting the day of your first shower.   You may shave your face at any point before/day of surgery.  Place clean sheets on your bed the day you start using CHG soap. Use a clean washcloth (not used since being washed) for each shower. DO NOT sleep with pets once you start using the CHG.   CHG Shower Instructions:  Wash your face and private area with normal soap. If you choose to wash your hair, wash first with your normal shampoo.  After you use  shampoo/soap, rinse your hair and body thoroughly to remove shampoo/soap residue.  Turn the water OFF and apply about 3 tablespoons (45 ml) of CHG soap to a CLEAN washcloth.  Apply CHG soap ONLY FROM YOUR NECK DOWN TO YOUR TOES (washing for 3-5 minutes)  DO NOT use CHG soap on face, private areas, open wounds, or sores.  Pay special attention to the area where your surgery is being performed.  If you are having back surgery, having someone wash your back for you may be helpful. Wait 2 minutes after CHG soap is applied, then you may rinse off the CHG soap.  Pat dry with a clean towel  Put on clean clothes/pajamas   If you choose to wear lotion, please use ONLY the CHG-compatible lotions on  the back of this paper.   Additional instructions for the day of surgery: DO NOT APPLY any lotions, deodorants, cologne, or perfumes.   Do not bring valuables to the hospital. [facility] is not responsible for any belongings/valuables. Do not wear nail polish, gel polish, artificial nails, or any other type of covering on natural nails (fingers and toes) Do not wear jewelry or makeup Put on clean/comfortable clothes.  Please brush your teeth.  Ask your nurse before applying any prescription medications to the skin.     CHG Compatible Lotions   Aveeno Moisturizing lotion  Cetaphil Moisturizing Cream  Cetaphil Moisturizing Lotion  Clairol Herbal Essence Moisturizing Lotion, Dry Skin  Clairol Herbal Essence Moisturizing Lotion, Extra Dry Skin  Clairol Herbal Essence Moisturizing Lotion, Normal Skin  Curel Age Defying Therapeutic Moisturizing Lotion with Alpha Hydroxy  Curel Extreme Care Body Lotion  Curel Soothing Hands Moisturizing Hand Lotion  Curel Therapeutic Moisturizing Cream, Fragrance-Free  Curel Therapeutic Moisturizing Lotion, Fragrance-Free  Curel Therapeutic Moisturizing Lotion, Original Formula  Eucerin Daily Replenishing Lotion  Eucerin Dry Skin Therapy Plus Alpha Hydroxy Crme   Eucerin Dry Skin Therapy Plus Alpha Hydroxy Lotion  Eucerin Original Crme  Eucerin Original Lotion  Eucerin Plus Crme Eucerin Plus Lotion  Eucerin TriLipid Replenishing Lotion  Keri Anti-Bacterial Hand Lotion  Keri Deep Conditioning Original Lotion Dry Skin Formula Softly Scented  Keri Deep Conditioning Original Lotion, Fragrance Free Sensitive Skin Formula  Keri Lotion Fast Absorbing Fragrance Free Sensitive Skin Formula  Keri Lotion Fast Absorbing Softly Scented Dry Skin Formula  Keri Original Lotion  Keri Skin Renewal Lotion Keri Silky Smooth Lotion  Keri Silky Smooth Sensitive Skin Lotion  Nivea Body Creamy Conditioning Oil  Nivea Body Extra Enriched Lotion  Nivea Body Original Lotion  Nivea Body Sheer Moisturizing Lotion Nivea Crme  Nivea Skin Firming Lotion  NutraDerm 30 Skin Lotion  NutraDerm Skin Lotion  NutraDerm Therapeutic Skin Cream  NutraDerm Therapeutic Skin Lotion  ProShield Protective Hand Cream  Provon moisturizing lotion  Please read over the following fact sheets that you were given.

## 2023-02-13 ENCOUNTER — Encounter (HOSPITAL_COMMUNITY)
Admission: RE | Admit: 2023-02-13 | Discharge: 2023-02-13 | Disposition: A | Discharge status: Home / Self Care | Payer: Medicare Other | Source: Ambulatory Visit | Attending: Neurological Surgery | Admitting: Neurological Surgery

## 2023-02-13 ENCOUNTER — Other Ambulatory Visit: Payer: Self-pay

## 2023-02-13 ENCOUNTER — Encounter (HOSPITAL_COMMUNITY): Payer: Self-pay

## 2023-02-13 VITALS — BP 139/62 | HR 78 | Temp 98.1°F | Ht 67.0 in | Wt 218.8 lb

## 2023-02-13 DIAGNOSIS — E119 Type 2 diabetes mellitus without complications: Secondary | ICD-10-CM | POA: Diagnosis not present

## 2023-02-13 DIAGNOSIS — Z01818 Encounter for other preprocedural examination: Secondary | ICD-10-CM | POA: Insufficient documentation

## 2023-02-13 HISTORY — DX: Personal history of urinary calculi: Z87.442

## 2023-02-13 LAB — SURGICAL PCR SCREEN
MRSA, PCR: NEGATIVE
Staphylococcus aureus: NEGATIVE

## 2023-02-13 LAB — PROTIME-INR
INR: 1 (ref 0.8–1.2)
Prothrombin Time: 13.2 s (ref 11.4–15.2)

## 2023-02-13 LAB — GLUCOSE, CAPILLARY: Glucose-Capillary: 193 mg/dL — ABNORMAL HIGH (ref 70–99)

## 2023-02-13 NOTE — Progress Notes (Signed)
PCP - Dr. Mickey Farber Cardiologist - Dr. Norman Herrlich - Last office visit 11/18/2022  PPM/ICD - Denies Device Orders - n/a Rep Notified - n/a  Chest x-ray - n/a EKG - 02/13/2023 Stress Test - Denies ECHO - 11/28/2021 Cardiac Cath - 07/27/2020 (completed for AVR work-up)  Sleep Study - +OSA. Pt wears CPAP nightly. She is not aware of pressure settings.  Pt is DM2. She checks her blood sugar daily. Normal fasting cbg is 140s. CBG at pre-op 193. Pt had waffled with syrup for breakfast and a cheeseburger with regular coke for lunch. Last A1c 7.1 on 02/04/23.  Last dose of GLP1 agonist- n/a GLP1 instructions: n/a  Blood Thinner Instructions: n/a Aspirin Instructions: Pt last dose of ASA was 02/11/23  NPO after midnight  COVID TEST- n/a   Anesthesia review: Yes. Cardiac Clearance and EKG review  Patient denies shortness of breath, fever, cough and chest pain at PAT appointment. Pt denies any respiratory illness/infection in the last two months.   All instructions explained to the patient, with a verbal understanding of the material. Patient agrees to go over the instructions while at home for a better understanding. Patient also instructed to self quarantine after being tested for COVID-19. The opportunity to ask questions was provided.

## 2023-02-14 NOTE — Plan of Care (Signed)
[facility] Tonsillectomy/Adenoidectomy, Postoperative PEDS care plan entered in error.

## 2023-02-14 NOTE — Anesthesia Preprocedure Evaluation (Signed)
Anesthesia Evaluation  Patient identified by MRN, date of birth, ID band Patient awake    Reviewed: Allergy & Precautions, NPO status , Patient's Chart, lab work & pertinent test results  Airway Mallampati: II  TM Distance: >3 FB Neck ROM: Full    Dental no notable dental hx. (+) Teeth Intact, Dental Advisory Given   Pulmonary sleep apnea and Continuous Positive Airway Pressure Ventilation    Pulmonary exam normal breath sounds clear to auscultation       Cardiovascular hypertension, Pt. on medications Normal cardiovascular exam+ dysrhythmias Atrial Fibrillation + Valvular Problems/Murmurs (s/p TAVR 2022) AS  Rhythm:Regular Rate:Normal  TTE 11/28/2021: 1. Left ventricular ejection fraction, by estimation, is 60 to 65%. The  left ventricle has normal function. The left ventricle has no regional  wall motion abnormalities. There is mild concentric left ventricular  hypertrophy. Left ventricular diastolic  parameters are consistent with Grade I diastolic dysfunction (impaired  relaxation).   2. Right ventricular systolic function is normal. The right ventricular  size is normal. There is normal pulmonary artery systolic pressure.   3. The mitral valve is normal in structure. Mild mitral valve  regurgitation. No evidence of mitral stenosis.   4. Aortic valve regurgitation is not visualized. There is a valve present  in the aortic position. Procedure Date: S/P aortic valve replacement with  bioprosthetic valve. Echo findings are consistent with normal structure  and function of the aortic valve  prosthesis.   5. Aortic normal DTA.   6. The inferior vena cava is normal in size with greater than 50%  respiratory variability, suggesting right atrial pressure of 3 mmHg.    Cath 07/27/2020: 1. Severe calcific aortic stenosis with mean gradient 67 mmHg 2. Patent coronary arteries (right dominant) with minimal irregularities but no  significant stenoses 3. Essentially normal right heart pressures with the exception of mild pulmonary HTN (mean PAP 27 mmHg)   Recommend: Continued multidisciplinary evaluation for treatment of severe symptomatic aortic stenosis.     Neuro/Psych negative neurological ROS  negative psych ROS   GI/Hepatic Neg liver ROS,GERD  ,,  Endo/Other  diabetes, Type 2, Oral Hypoglycemic Agents    Renal/GU negative Renal ROS  negative genitourinary   Musculoskeletal  (+) Arthritis ,    Abdominal   Peds  Hematology negative hematology ROS (+)   Anesthesia Other Findings 72 year old female follows with cardiology for history of aortic stenosis s/p bioprosthetic AVR 07/2020.  Surgery was complicated by postoperative atrial fibrillation transiently treated with amiodarone and OSA treated with CPAP  Reproductive/Obstetrics                             Anesthesia Physical Anesthesia Plan  ASA: 3  Anesthesia Plan: General   Post-op Pain Management: Tylenol PO (pre-op)*   Induction: Intravenous  PONV Risk Score and Plan: 3 and Dexamethasone, Ondansetron and Treatment may vary due to age or medical condition  Airway Management Planned: Oral ETT and Video Laryngoscope Planned  Additional Equipment:   Intra-op Plan:   Post-operative Plan: Extubation in OR  Informed Consent: I have reviewed the patients History and Physical, chart, labs and discussed the procedure including the risks, benefits and alternatives for the proposed anesthesia with the patient or authorized representative who has indicated his/her understanding and acceptance.     Dental advisory given  Plan Discussed with: CRNA  Anesthesia Plan Comments: (PAT note by Antionette Poles, PA-C: 72 year old female follows with cardiology for history  of aortic stenosis s/p bioprosthetic SAVR 07/2020.  Surgery was complicated by postoperative atrial fibrillation transiently treated with amiodarone and OSA  treated with CPAP.  Follow-up echo 11/28/2021 showed EF 65%, grade 1 DD, normally functioning prosthetic aortic valve.  Last seen by Dr. Dulce Sellar 11/18/2022, doing well at that time, 1 year follow-up recommended.  Cardiac clearance per telephone encounter 11/22/2022 by Eligha Bridegroom, NP, "Chart reviewed as part of pre-operative protocol coverage. Given past medical history and time since last visit, based on ACC/AHA guidelines, ATLEIGH MARRESE would be at acceptable risk for the planned procedure without further cardiovascular testing. Patient was advised that if she develops new symptoms prior to surgery to contact our office to arrange a follow-up appointment.  He verbalized understanding. Per office protocol, aspirin may be held for 5-7 days prior to surgery. Please resume aspirin post operatively when it is felt to be safe from a bleeding standpoint."  Other pertinent history includes HTN, GERD, non-insulin-dependent DM2.  CMP and CBC from 02/04/2023 reviewed, unremarkable.  A1c 7.1.  EKG 02/13/2023: Normal sinus rhythm.  Rate 69. Inferior infarct , age undetermined. Anterior infarct , age undetermined  TTE 11/28/2021: 1. Left ventricular ejection fraction, by estimation, is 60 to 65%. The  left ventricle has normal function. The left ventricle has no regional  wall motion abnormalities. There is mild concentric left ventricular  hypertrophy. Left ventricular diastolic  parameters are consistent with Grade I diastolic dysfunction (impaired  relaxation).  2. Right ventricular systolic function is normal. The right ventricular  size is normal. There is normal pulmonary artery systolic pressure.  3. The mitral valve is normal in structure. Mild mitral valve  regurgitation. No evidence of mitral stenosis.  4. Aortic valve regurgitation is not visualized. There is a valve present  in the aortic position. Procedure Date: S/P aortic valve replacement with  bioprosthetic valve. Echo findings are  consistent with normal structure  and function of the aortic valve  prosthesis.  5. Aortic normal DTA.  6. The inferior vena cava is normal in size with greater than 50%  respiratory variability, suggesting right atrial pressure of 3 mmHg.   Cath 07/27/2020: 1. Severe calcific aortic stenosis with mean gradient 67 mmHg 2. Patent coronary arteries (right dominant) with minimal irregularities but no significant stenoses 3. Essentially normal right heart pressures with the exception of mild pulmonary HTN (mean PAP 27 mmHg)  Recommend: Continued multidisciplinary evaluation for treatment of severe symptomatic aortic stenosis.   )        Anesthesia Quick Evaluation

## 2023-02-14 NOTE — Progress Notes (Signed)
Anesthesia Chart Review:  72 year old female follows with cardiology for history of aortic stenosis s/p bioprosthetic SAVR 07/2020.  Surgery was complicated by postoperative atrial fibrillation transiently treated with amiodarone and OSA treated with CPAP.  Follow-up echo 11/28/2021 showed EF 65%, grade 1 DD, normally functioning prosthetic aortic valve.  Last seen by Dr. Dulce Sellar 11/18/2022, doing well at that time, 1 year follow-up recommended.  Cardiac clearance per telephone encounter 11/22/2022 by Eligha Bridegroom, NP, "Chart reviewed as part of pre-operative protocol coverage. Given past medical history and time since last visit, based on ACC/AHA guidelines, Megan Galvan would be at acceptable risk for the planned procedure without further cardiovascular testing. Patient was advised that if she develops new symptoms prior to surgery to contact our office to arrange a follow-up appointment.  He verbalized understanding. Per office protocol, aspirin may be held for 5-7 days prior to surgery. Please resume aspirin post operatively when it is felt to be safe from a bleeding standpoint."  Other pertinent history includes HTN, GERD, non-insulin-dependent DM2.  CMP and CBC from 02/04/2023 reviewed, unremarkable.  A1c 7.1.  EKG 02/13/2023: Normal sinus rhythm.  Rate 69. Inferior infarct , age undetermined. Anterior infarct , age undetermined  TTE 11/28/2021: 1. Left ventricular ejection fraction, by estimation, is 60 to 65%. The  left ventricle has normal function. The left ventricle has no regional  wall motion abnormalities. There is mild concentric left ventricular  hypertrophy. Left ventricular diastolic  parameters are consistent with Grade I diastolic dysfunction (impaired  relaxation).   2. Right ventricular systolic function is normal. The right ventricular  size is normal. There is normal pulmonary artery systolic pressure.   3. The mitral valve is normal in structure. Mild mitral valve   regurgitation. No evidence of mitral stenosis.   4. Aortic valve regurgitation is not visualized. There is a valve present  in the aortic position. Procedure Date: S/P aortic valve replacement with  bioprosthetic valve. Echo findings are consistent with normal structure  and function of the aortic valve  prosthesis.   5. Aortic normal DTA.   6. The inferior vena cava is normal in size with greater than 50%  respiratory variability, suggesting right atrial pressure of 3 mmHg.   Cath 07/27/2020: 1. Severe calcific aortic stenosis with mean gradient 67 mmHg 2. Patent coronary arteries (right dominant) with minimal irregularities but no significant stenoses 3. Essentially normal right heart pressures with the exception of mild pulmonary HTN (mean PAP 27 mmHg)   Recommend: Continued multidisciplinary evaluation for treatment of severe symptomatic aortic stenosis.     [name] [facility] Stay [facility]/Anesthesiology Phone [phone] 02/14/2023 10:21 AM

## 2023-02-16 ENCOUNTER — Other Ambulatory Visit: Payer: Self-pay | Admitting: Family Medicine

## 2023-02-16 DIAGNOSIS — I10 Essential (primary) hypertension: Secondary | ICD-10-CM

## 2023-02-19 ENCOUNTER — Ambulatory Visit (HOSPITAL_BASED_OUTPATIENT_CLINIC_OR_DEPARTMENT_OTHER): Payer: Medicare Other

## 2023-02-19 ENCOUNTER — Other Ambulatory Visit: Payer: Self-pay

## 2023-02-19 ENCOUNTER — Encounter (HOSPITAL_COMMUNITY)
Admission: RE | Disposition: A | Discharge status: Home / Self Care | Payer: Self-pay | Source: Home / Self Care | Attending: Neurological Surgery

## 2023-02-19 ENCOUNTER — Ambulatory Visit (HOSPITAL_COMMUNITY): Payer: Medicare Other

## 2023-02-19 ENCOUNTER — Encounter (HOSPITAL_COMMUNITY): Payer: Self-pay | Admitting: Neurological Surgery

## 2023-02-19 ENCOUNTER — Ambulatory Visit (HOSPITAL_COMMUNITY): Payer: Medicare Other | Admitting: Physician Assistant

## 2023-02-19 ENCOUNTER — Observation Stay (HOSPITAL_COMMUNITY)
Admission: RE | Admit: 2023-02-19 | Discharge: 2023-02-20 | Disposition: A | Discharge status: Home / Self Care | Payer: Medicare Other | Attending: Neurological Surgery | Admitting: Neurological Surgery

## 2023-02-19 DIAGNOSIS — E119 Type 2 diabetes mellitus without complications: Secondary | ICD-10-CM | POA: Insufficient documentation

## 2023-02-19 DIAGNOSIS — Z91040 Latex allergy status: Secondary | ICD-10-CM | POA: Insufficient documentation

## 2023-02-19 DIAGNOSIS — Z79899 Other long term (current) drug therapy: Secondary | ICD-10-CM | POA: Diagnosis not present

## 2023-02-19 DIAGNOSIS — Z981 Arthrodesis status: Secondary | ICD-10-CM

## 2023-02-19 DIAGNOSIS — M4802 Spinal stenosis, cervical region: Secondary | ICD-10-CM | POA: Diagnosis not present

## 2023-02-19 DIAGNOSIS — Z7982 Long term (current) use of aspirin: Secondary | ICD-10-CM | POA: Insufficient documentation

## 2023-02-19 DIAGNOSIS — I10 Essential (primary) hypertension: Secondary | ICD-10-CM | POA: Insufficient documentation

## 2023-02-19 DIAGNOSIS — M4712 Other spondylosis with myelopathy, cervical region: Secondary | ICD-10-CM | POA: Diagnosis not present

## 2023-02-19 HISTORY — PX: ANTERIOR CERVICAL DECOMP/DISCECTOMY FUSION: SHX1161

## 2023-02-19 HISTORY — DX: Arthrodesis status: Z98.1

## 2023-02-19 LAB — GLUCOSE, CAPILLARY
Glucose-Capillary: 142 mg/dL — ABNORMAL HIGH (ref 70–99)
Glucose-Capillary: 170 mg/dL — ABNORMAL HIGH (ref 70–99)
Glucose-Capillary: 140 mg/dL — ABNORMAL HIGH (ref 70–99)
Glucose-Capillary: 196 mg/dL — ABNORMAL HIGH (ref 70–99)
Glucose-Capillary: 213 mg/dL — ABNORMAL HIGH (ref 70–99)

## 2023-02-19 SURGERY — ANTERIOR CERVICAL DECOMPRESSION/DISCECTOMY FUSION 1 LEVEL
Anesthesia: General | Site: Spine Cervical

## 2023-02-19 MED ORDER — THROMBIN 5000 UNITS EX SOLR
CUTANEOUS | Status: AC
  Filled 2023-02-19: qty 5000

## 2023-02-19 MED ORDER — INSULIN ASPART 100 UNIT/ML IJ SOLN: 0.0000 [IU] | INTRAMUSCULAR | Status: DC | PRN

## 2023-02-19 MED ORDER — SODIUM CHLORIDE 0.9% FLUSH
3.0000 mL | Freq: Two times a day (BID) | INTRAVENOUS | Status: DC
  Administered 2023-02-19 (×2): 3 mL via INTRAVENOUS

## 2023-02-19 MED ORDER — METHOCARBAMOL 1000 MG/10ML IJ SOLN: 500.0000 mg | Freq: Four times a day (QID) | INTRAMUSCULAR | Status: DC | PRN

## 2023-02-19 MED ORDER — INSULIN ASPART 100 UNIT/ML IJ SOLN
0.0000 [IU] | Freq: Three times a day (TID) | INTRAMUSCULAR | Status: DC
  Administered 2023-02-20: 3 [IU] via SUBCUTANEOUS

## 2023-02-19 MED ORDER — ROCURONIUM BROMIDE 10 MG/ML (PF) SYRINGE
PREFILLED_SYRINGE | INTRAVENOUS | Status: DC | PRN
  Administered 2023-02-19: 50 mg via INTRAVENOUS

## 2023-02-19 MED ORDER — THROMBIN 5000 UNITS EX SOLR: OROMUCOSAL | Status: DC | PRN

## 2023-02-19 MED ORDER — BUPIVACAINE HCL (PF) 0.25 % IJ SOLN
INTRAMUSCULAR | Status: AC
  Filled 2023-02-19: qty 30

## 2023-02-19 MED ORDER — ROCURONIUM BROMIDE 10 MG/ML (PF) SYRINGE
PREFILLED_SYRINGE | INTRAVENOUS | Status: AC
  Filled 2023-02-19: qty 10

## 2023-02-19 MED ORDER — KETOROLAC TROMETHAMINE 30 MG/ML IJ SOLN
INTRAMUSCULAR | Status: AC
  Filled 2023-02-19: qty 1

## 2023-02-19 MED ORDER — LIDOCAINE 2% (20 MG/ML) 5 ML SYRINGE
INTRAMUSCULAR | Status: DC | PRN
  Administered 2023-02-19: 50 mg via INTRAVENOUS

## 2023-02-19 MED ORDER — MORPHINE SULFATE (PF) 2 MG/ML IV SOLN: 2.0000 mg | INTRAVENOUS | Status: DC | PRN

## 2023-02-19 MED ORDER — HYDROCODONE-ACETAMINOPHEN 5-325 MG PO TABS
1.0000 | ORAL_TABLET | ORAL | Status: DC | PRN
  Administered 2023-02-19: 1 via ORAL

## 2023-02-19 MED ORDER — KCL IN DEXTROSE-NACL 10-5-0.45 MEQ/L-%-% IV SOLN
INTRAVENOUS | Status: DC
  Filled 2023-02-19: qty 1000

## 2023-02-19 MED ORDER — ACETAMINOPHEN 500 MG PO TABS
1000.0000 mg | ORAL_TABLET | ORAL | Status: DC
  Filled 2023-02-19: qty 2

## 2023-02-19 MED ORDER — FENTANYL CITRATE (PF) 250 MCG/5ML IJ SOLN
INTRAMUSCULAR | Status: AC
  Filled 2023-02-19: qty 5

## 2023-02-19 MED ORDER — HYDROCODONE-ACETAMINOPHEN 5-325 MG PO TABS
1.0000 | ORAL_TABLET | ORAL | Status: DC | PRN
  Administered 2023-02-19: 2 via ORAL
  Administered 2023-02-20 (×3): 1 via ORAL
  Filled 2023-02-19: qty 2
  Filled 2023-02-19 (×2): qty 1
  Filled 2023-02-19: qty 2

## 2023-02-19 MED ORDER — SODIUM CHLORIDE 0.9% FLUSH: 10.0000 mL | Freq: Two times a day (BID) | INTRAVENOUS | Status: DC

## 2023-02-19 MED ORDER — METHOCARBAMOL 500 MG PO TABS
500.0000 mg | ORAL_TABLET | Freq: Four times a day (QID) | ORAL | Status: DC | PRN
  Administered 2023-02-19 – 2023-02-20 (×3): 500 mg via ORAL
  Filled 2023-02-19 (×2): qty 1

## 2023-02-19 MED ORDER — CHLORHEXIDINE GLUCONATE 0.12 % MT SOLN
15.0000 mL | Freq: Once | OROMUCOSAL | Status: AC
  Administered 2023-02-19: 15 mL via OROMUCOSAL
  Filled 2023-02-19: qty 15

## 2023-02-19 MED ORDER — DEXAMETHASONE SODIUM PHOSPHATE 10 MG/ML IJ SOLN
INTRAMUSCULAR | Status: DC | PRN
  Administered 2023-02-19: 4 mg via INTRAVENOUS

## 2023-02-19 MED ORDER — SUGAMMADEX SODIUM 200 MG/2ML IV SOLN
INTRAVENOUS | Status: DC | PRN
  Administered 2023-02-19: 400 mg via INTRAVENOUS

## 2023-02-19 MED ORDER — ACETAMINOPHEN 650 MG RE SUPP: 650.0000 mg | RECTAL | Status: DC | PRN

## 2023-02-19 MED ORDER — PANTOPRAZOLE SODIUM 40 MG PO TBEC: 40.0000 mg | DELAYED_RELEASE_TABLET | Freq: Every day | ORAL | Status: DC

## 2023-02-19 MED ORDER — SODIUM CHLORIDE 0.9% FLUSH: 3.0000 mL | INTRAVENOUS | Status: DC | PRN

## 2023-02-19 MED ORDER — ACETAMINOPHEN 500 MG PO TABS
500.0000 mg | ORAL_TABLET | Freq: Once | ORAL | Status: AC
  Administered 2023-02-19: 500 mg via ORAL

## 2023-02-19 MED ORDER — BUPIVACAINE HCL (PF) 0.25 % IJ SOLN
INTRAMUSCULAR | Status: DC | PRN
  Administered 2023-02-19: 7 mL

## 2023-02-19 MED ORDER — PROPOFOL 10 MG/ML IV BOLUS
INTRAVENOUS | Status: DC | PRN
  Administered 2023-02-19: 120 mg via INTRAVENOUS
  Administered 2023-02-19: 50 mg via INTRAVENOUS

## 2023-02-19 MED ORDER — CHLORHEXIDINE GLUCONATE CLOTH 2 % EX PADS: 6.0000 | MEDICATED_PAD | Freq: Once | CUTANEOUS | Status: DC

## 2023-02-19 MED ORDER — PROPOFOL 500 MG/50ML IV EMUL
INTRAVENOUS | Status: DC | PRN
  Administered 2023-02-19: 125 ug/kg/min via INTRAVENOUS

## 2023-02-19 MED ORDER — PIOGLITAZONE HCL 15 MG PO TABS
15.0000 mg | ORAL_TABLET | Freq: Every day | ORAL | Status: DC
  Administered 2023-02-19: 15 mg via ORAL
  Filled 2023-02-19: qty 1

## 2023-02-19 MED ORDER — 0.9 % SODIUM CHLORIDE (POUR BTL) OPTIME
TOPICAL | Status: DC | PRN
  Administered 2023-02-19: 1000 mL

## 2023-02-19 MED ORDER — HYDROCODONE-ACETAMINOPHEN 5-325 MG PO TABS
ORAL_TABLET | ORAL | Status: AC
  Filled 2023-02-19: qty 1

## 2023-02-19 MED ORDER — LACTATED RINGERS IV SOLN: INTRAVENOUS | Status: DC | PRN

## 2023-02-19 MED ORDER — ONDANSETRON HCL 4 MG/2ML IJ SOLN
INTRAMUSCULAR | Status: AC
  Filled 2023-02-19: qty 2

## 2023-02-19 MED ORDER — PROPOFOL 10 MG/ML IV BOLUS
INTRAVENOUS | Status: AC
  Filled 2023-02-19: qty 20

## 2023-02-19 MED ORDER — LIDOCAINE 2% (20 MG/ML) 5 ML SYRINGE
INTRAMUSCULAR | Status: AC
  Filled 2023-02-19: qty 5

## 2023-02-19 MED ORDER — FENTANYL CITRATE (PF) 250 MCG/5ML IJ SOLN
INTRAMUSCULAR | Status: DC | PRN
  Administered 2023-02-19 (×2): 50 ug via INTRAVENOUS

## 2023-02-19 MED ORDER — CHLORTHALIDONE 25 MG PO TABS: 50.0000 mg | ORAL_TABLET | Freq: Every day | ORAL | Status: DC

## 2023-02-19 MED ORDER — ACETAMINOPHEN 325 MG PO TABS: 650.0000 mg | ORAL_TABLET | ORAL | Status: DC | PRN

## 2023-02-19 MED ORDER — SENNA 8.6 MG PO TABS
1.0000 | ORAL_TABLET | Freq: Two times a day (BID) | ORAL | Status: DC
  Administered 2023-02-19 (×2): 8.6 mg via ORAL
  Filled 2023-02-19 (×2): qty 1

## 2023-02-19 MED ORDER — VANCOMYCIN HCL IN DEXTROSE 1-5 GM/200ML-% IV SOLN
1000.0000 mg | Freq: Once | INTRAVENOUS | Status: AC
  Administered 2023-02-19: 1000 mg via INTRAVENOUS
  Filled 2023-02-19: qty 200

## 2023-02-19 MED ORDER — DEXAMETHASONE SODIUM PHOSPHATE 10 MG/ML IJ SOLN
INTRAMUSCULAR | Status: AC
  Filled 2023-02-19: qty 1

## 2023-02-19 MED ORDER — ONDANSETRON HCL 4 MG PO TABS: 4.0000 mg | ORAL_TABLET | Freq: Four times a day (QID) | ORAL | Status: DC | PRN

## 2023-02-19 MED ORDER — ONDANSETRON HCL 4 MG/2ML IJ SOLN
INTRAMUSCULAR | Status: DC | PRN
  Administered 2023-02-19: 4 mg via INTRAVENOUS

## 2023-02-19 MED ORDER — ONDANSETRON HCL 4 MG/2ML IJ SOLN: 4.0000 mg | Freq: Four times a day (QID) | INTRAMUSCULAR | Status: DC | PRN

## 2023-02-19 MED ORDER — MENTHOL 3 MG MT LOZG: 1.0000 | LOZENGE | OROMUCOSAL | Status: DC | PRN

## 2023-02-19 MED ORDER — FENTANYL CITRATE (PF) 100 MCG/2ML IJ SOLN: 25.0000 ug | INTRAMUSCULAR | Status: DC | PRN

## 2023-02-19 MED ORDER — GABAPENTIN 300 MG PO CAPS
300.0000 mg | ORAL_CAPSULE | ORAL | Status: AC
  Administered 2023-02-19: 300 mg via ORAL
  Filled 2023-02-19: qty 1

## 2023-02-19 MED ORDER — LACTATED RINGERS IV SOLN: INTRAVENOUS | Status: DC

## 2023-02-19 MED ORDER — PHENYLEPHRINE HCL-NACL 20-0.9 MG/250ML-% IV SOLN
INTRAVENOUS | Status: DC | PRN
  Administered 2023-02-19: 15 ug/min via INTRAVENOUS

## 2023-02-19 MED ORDER — METHOCARBAMOL 500 MG PO TABS
ORAL_TABLET | ORAL | Status: AC
  Filled 2023-02-19: qty 1

## 2023-02-19 MED ORDER — INSULIN ASPART 100 UNIT/ML IJ SOLN
0.0000 [IU] | Freq: Three times a day (TID) | INTRAMUSCULAR | Status: DC
Start: 2023-02-19 — End: 2023-02-19
  Administered 2023-02-19: 3 [IU] via SUBCUTANEOUS

## 2023-02-19 MED ORDER — HEMOSTATIC AGENTS (NO CHARGE) OPTIME
TOPICAL | Status: DC | PRN
  Administered 2023-02-19: 1

## 2023-02-19 MED ORDER — GLYCOPYRROLATE 0.2 MG/ML IJ SOLN
INTRAMUSCULAR | Status: DC | PRN
  Administered 2023-02-19: .2 mg via INTRAVENOUS

## 2023-02-19 MED ORDER — ORAL CARE MOUTH RINSE: 15.0000 mL | Freq: Once | OROMUCOSAL | Status: AC

## 2023-02-19 MED ORDER — VANCOMYCIN HCL IN DEXTROSE 1-5 GM/200ML-% IV SOLN
1000.0000 mg | INTRAVENOUS | Status: AC
  Administered 2023-02-19: 1000 mg via INTRAVENOUS
  Filled 2023-02-19: qty 200

## 2023-02-19 MED ORDER — IRBESARTAN 75 MG PO TABS
37.5000 mg | ORAL_TABLET | Freq: Every day | ORAL | Status: DC
  Administered 2023-02-19: 37.5 mg via ORAL
  Filled 2023-02-19: qty 1

## 2023-02-19 MED ORDER — PHENOL 1.4 % MT LIQD
1.0000 | OROMUCOSAL | Status: DC | PRN
  Filled 2023-02-19: qty 177

## 2023-02-19 MED ORDER — PHENYLEPHRINE 80 MCG/ML (10ML) SYRINGE FOR IV PUSH (FOR BLOOD PRESSURE SUPPORT)
PREFILLED_SYRINGE | INTRAVENOUS | Status: DC | PRN
  Administered 2023-02-19: 160 ug via INTRAVENOUS

## 2023-02-19 MED ORDER — DOCUSATE SODIUM 100 MG PO CAPS
100.0000 mg | ORAL_CAPSULE | Freq: Two times a day (BID) | ORAL | Status: DC | PRN
  Administered 2023-02-19: 100 mg via ORAL
  Filled 2023-02-19: qty 1

## 2023-02-19 SURGICAL SUPPLY — 47 items
APL SKNCLS STERI-STRIP NONHPOA (GAUZE/BANDAGES/DRESSINGS) ×1
BAG COUNTER SPONGE SURGICOUNT (BAG) ×1 IMPLANT
BAG SPNG CNTER NS LX DISP (BAG) ×1
BASKET BONE COLLECTION (BASKET) ×1 IMPLANT
BENZOIN TINCTURE PRP APPL 2/3 (GAUZE/BANDAGES/DRESSINGS) ×1 IMPLANT
BUR CARBIDE MATCH 3.0 (BURR) ×1 IMPLANT
CANISTER SUCT 3000ML PPV (MISCELLANEOUS) ×1 IMPLANT
DRAPE C-ARM 42X72 X-RAY (DRAPES) ×2 IMPLANT
DRAPE LAPAROTOMY 100X72 PEDS (DRAPES) ×1 IMPLANT
DRAPE MICROSCOPE SLANT 54X150 (MISCELLANEOUS) IMPLANT
DRSG OPSITE POSTOP 4X6 (GAUZE/BANDAGES/DRESSINGS) IMPLANT
DURAPREP 6ML APPLICATOR 50/CS (WOUND CARE) ×1 IMPLANT
ELECT COATED BLADE 2.86 ST (ELECTRODE) ×1 IMPLANT
ELECT REM PT RETURN 9FT ADLT (ELECTROSURGICAL) ×1
ELECTRODE REM PT RTRN 9FT ADLT (ELECTROSURGICAL) ×1 IMPLANT
GAUZE 4X4 16PLY ~~LOC~~+RFID DBL (SPONGE) IMPLANT
GLOVE BIO SURGEON STRL SZ7 (GLOVE) IMPLANT
GLOVE BIO SURGEON STRL SZ8 (GLOVE) ×1 IMPLANT
GLOVE BIOGEL PI IND STRL 7.0 (GLOVE) IMPLANT
GOWN STRL REUS W/ TWL LRG LVL3 (GOWN DISPOSABLE) IMPLANT
GOWN STRL REUS W/ TWL XL LVL3 (GOWN DISPOSABLE) IMPLANT
GOWN STRL REUS W/TWL 2XL LVL3 (GOWN DISPOSABLE) ×1 IMPLANT
GOWN STRL REUS W/TWL LRG LVL3 (GOWN DISPOSABLE)
GOWN STRL REUS W/TWL XL LVL3 (GOWN DISPOSABLE)
HEMOSTAT POWDER KIT SURGIFOAM (HEMOSTASIS) ×1 IMPLANT
KIT BASIN OR (CUSTOM PROCEDURE TRAY) ×1 IMPLANT
KIT TURNOVER KIT B (KITS) ×1 IMPLANT
NDL HYPO 25X1 1.5 SAFETY (NEEDLE) ×1 IMPLANT
NDL SPNL 20GX3.5 QUINCKE YW (NEEDLE) ×1 IMPLANT
NEEDLE HYPO 25X1 1.5 SAFETY (NEEDLE) ×1 IMPLANT
NEEDLE SPNL 20GX3.5 QUINCKE YW (NEEDLE) ×1 IMPLANT
NS IRRIG 1000ML POUR BTL (IV SOLUTION) ×1 IMPLANT
PACK LAMINECTOMY NEURO (CUSTOM PROCEDURE TRAY) ×1 IMPLANT
PAD ARMBOARD 7.5X6 YLW CONV (MISCELLANEOUS) ×1 IMPLANT
PIN DISTRACTION 14MM (PIN) ×2 IMPLANT
PLATE ANT CERV INSG 22 1L (Plate) IMPLANT
SCREW VA SINGLE LEAD 4X14 (Screw) ×4 IMPLANT
SCREW VA SINGLE LEAD 4X14 ST (Screw) IMPLANT
SPACER ASSEM CERV LORD 8M (Spacer) IMPLANT
SPONGE INTESTINAL PEANUT (DISPOSABLE) ×1 IMPLANT
SPONGE SURGIFOAM ABS GEL SZ50 (HEMOSTASIS) IMPLANT
STRIP CLOSURE SKIN 1/2X4 (GAUZE/BANDAGES/DRESSINGS) ×1 IMPLANT
SUT VIC AB 3-0 SH 8-18 (SUTURE) ×1 IMPLANT
SUT VICRYL 4-0 PS2 18IN ABS (SUTURE) IMPLANT
TOWEL GREEN STERILE (TOWEL DISPOSABLE) ×1 IMPLANT
TOWEL GREEN STERILE FF (TOWEL DISPOSABLE) ×1 IMPLANT
WATER STERILE IRR 1000ML POUR (IV SOLUTION) ×1 IMPLANT

## 2023-02-19 NOTE — Transfer of Care (Signed)
Immediate Anesthesia Transfer of Care Note  Patient: Megan Galvan  Procedure(s) Performed: Anterior Cervical Discectomy Fusion-Cervical Three-Cervical Four, Removal Plate Cervical Four-Cervical Five (Spine Cervical)  Patient Location: PACU  Anesthesia Type:General  Level of Consciousness: sedated and drowsy  Airway & Oxygen Therapy: Patient Spontanous Breathing and Patient connected to face mask oxygen  Post-op Assessment: Report given to RN and Post -op Vital signs reviewed and stable  Post vital signs: Reviewed and stable  Last Vitals:  Vitals Value Taken Time  BP 156/67 02/19/23 1232  Temp 97.4   Pulse 67 02/19/23 1235  Resp 11 02/19/23 1235  SpO2 100 % 02/19/23 1235  Vitals shown include unfiled device data.  Last Pain:  Vitals:   02/19/23 0812  TempSrc:   PainSc: 0-No pain         Complications: No notable events documented.

## 2023-02-19 NOTE — Op Note (Signed)
02/19/2023  12:21 PM  PATIENT:  Megan Galvan  72 y.o. 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 Decompressive anterior cervical discectomy C3-4, 2. Anterior cervical arthrodesis C3-4 utilizing a cortical cancellous structural allograft  3. Anterior cervical plating C3-4 utilizing a ATEC plate, 4.  Removal of C4-5 anterior cervical plate  SURGEON:  Marikay Alar, MD  ASSISTANTS: Verlin Dike, FNP  ANESTHESIA:   General  EBL: 5 ml  Total I/O In: 700 [I.V.:700] Out: 25 [Blood:25]  BLOOD ADMINISTERED: none  DRAINS: none  SPECIMEN:  none  INDICATION FOR PROCEDURE: This patient presented with neck pain with numbness and tingling in her hands. Imaging showed stenosis C3-4 above previous C4-5 fusion. The patient tried conservative measures without relief. Pain was debilitating. Recommended ACDF with plating. Patient understood the risks, benefits, and alternatives and potential outcomes and wished to proceed.  PROCEDURE DETAILS: Patient was brought to the operating room placed under general endotracheal anesthesia. Patient was placed in the supine position on the operating room table. The neck was prepped with Duraprep and draped in a sterile fashion.   Three cc of local anesthesia was injected and a transverse incision was made on the right side of the neck.  Dissection was carried down thru the subcutaneous tissue and the platysma was  elevated, opened, and undermined with Metzenbaum scissors.  Dissection was then carried out thru an avascular plane leaving the sternocleidomastoid carotid artery and jugular vein laterally and the trachea and esophagus medially with the assistance of my nurse practitioner. The ventral aspect of the vertebral column was identified and the old plate was identified.  Blunt dissection was used to remove the soft tissue and  they were removed L4 screws and remove the plate at U1-3.  We used Surgifoam in the screw holes.  And a localizing x-ray was taken. The C3-4 level was identified and all in the room agreed with the level.  The osteophytes were removed from the anterior part of the disc base of C3-4.  The longus colli muscles were then elevated and the retractor was placed with the assistance of my nurse practitioner. The annulus was incised and the disc space entered. Discectomy was performed with micro-curettes and pituitary rongeurs. I then used the high-speed drill to drill the endplates down to the level of the posterior longitudinal ligament. The drill shavings were saved in a mucous trap for later arthrodesis. The operating microscope was draped and brought into the field provided additional magnification, illumination and visualization. Discectomy was continued posteriorly thru the disc space. Posterior longitudinal ligament was opened with a nerve hook, and then removed along with disc herniation and osteophytes, decompressing the spinal canal and thecal sac. We then continued to remove osteophytic overgrowth and disc material decompressing the neural foramina and exiting nerve roots bilaterally. The scope was angled up and down to help decompress and undercut the vertebral bodies. Once the decompression was completed we could pass a nerve hook circumferentially to assure adequate decompression in the midline and in the neural foramina. So by both visualization and palpation we felt we had an adequate decompression of the neural elements. We then measured the height of the intravertebral disc space and selected a 8 millimeter structural allograft. It was then gently positioned in the intravertebral disc space(s) and countersunk. I then used a 22 mm ATEC plate and placed 14 mm variable angle screws into the vertebral  bodies of each level and locked them into position. The wound was irrigated with bacitracin solution, checked  for hemostasis which was established and confirmed. Once meticulous hemostasis was achieved, we then proceeded with closure with the assistance of my nurse practitioner. The platysma was closed with interrupted 3-0 undyed Vicryl suture, the subcuticular layer was closed with interrupted 3-0 undyed Vicryl suture. The skin edges were approximated with steristrips. The drapes were removed. A sterile dressing was applied. The patient was then awakened from general anesthesia and transferred to the recovery room in stable condition. At the end of the procedure all sponge, needle and instrument counts were correct.   PLAN OF CARE: Admit for overnight observation  PATIENT DISPOSITION:  PACU - hemodynamically stable.   Delay start of Pharmacological VTE agent (>24hrs) due to surgical blood loss or risk of bleeding:  yes

## 2023-02-19 NOTE — Anesthesia Procedure Notes (Addendum)
Procedure Name: Intubation Date/Time: 02/19/2023 10:31 AM  Performed by: Pincus Large, CRNAPre-anesthesia Checklist: Patient identified, Emergency Drugs available, Suction available and Patient being monitored Patient Re-evaluated:Patient Re-evaluated prior to induction Oxygen Delivery Method: Circle System Utilized Preoxygenation: Pre-oxygenation with 100% oxygen Induction Type: IV induction Ventilation: Mask ventilation without difficulty Laryngoscope Size: 3 and Glidescope Grade View: Grade II Tube type: Oral Tube size: 7.0 mm Number of attempts: 1 Airway Equipment and Method: Stylet and Oral airway Placement Confirmation: ETT inserted through vocal cords under direct vision, positive ETCO2 and breath sounds checked- equal and bilateral Secured at: 22 cm Tube secured with: Tape Dental Injury: Teeth and Oropharynx as per pre-operative assessment

## 2023-02-19 NOTE — H&P (Signed)
Subjective:   Patient is a 72 y.o. female admitted for cervical stenosis. The patient first presented to me with complaints of neck pain, shooting pains in the arm(s), and numbness of the arm(s). Onset of symptoms was a few months ago. The pain is described as aching and occurs intermittently. The pain is rated moderate, and is located in the neck and radiates to the arms. The symptoms have been progressive. Symptoms are exacerbated by none, and are relieved by none.  Previous work up includes MRI of cervical spine, results: spinal stenosis.  Past Medical History:  Diagnosis Date   Allergic sinusitis 08/27/2022   Arthritis    Atrophic vaginitis 03/31/2021   Cataract    Class 1 obesity due to excess calories with serious comorbidity and body mass index (BMI) of 33.0 to 33.9 in adult 03/28/2022   Encounter for osteoporosis screening in asymptomatic postmenopausal patient 06/28/2022   External hemorrhoids 11/12/2021   Gastro-esophageal reflux disease without esophagitis 06/03/2019   Heart murmur    AVR   History of kidney stones    Hypertension associated with diabetes (HCC) 10/01/2019   Mixed hyperlipidemia    Mucositis (ulcerative) of vagina and vulva 06/21/2022   Neck pain, chronic 10/18/2022   Primary insomnia 06/03/2019   S/P aortic valve replacement with bioprosthetic valve 08/10/2020   21 mm Edwards Resilia Inspiria Bioprosthetic Tissue Valve  SN [id number] Model [id number]   Sleep apnea    cpap   Type 2 diabetes mellitus with diabetic polyneuropathy, without long-term current use of insulin (HCC) 06/03/2019   Vertigo 07/20/2019    Past Surgical History:  Procedure Laterality Date   ANTERIOR CERVICAL DECOMP/DISCECTOMY FUSION     x 2   AORTIC VALVE REPLACEMENT N/A 08/10/2020   Procedure: AORTIC VALVE REPLACEMENT (AVR) USING INSPIRIS VALVE SIZE ;  Surgeon: Purcell Nails, MD;  Location: [facility] OR;  Service: Open Heart Surgery;  Laterality: N/A;   BACK SURGERY  11/17/2016   L3-L5    BILATERAL CARPAL TUNNEL RELEASE     bone spur Left    left shoulder   bone spur Right    Right shoulder   CATARACT EXTRACTION Left 09/18/2021   COLONOSCOPY  08/22/2011   Moderate predominantly sigmoid diverticulosis. Small internal hemorrhoids.    COLONOSCOPY     HEMORROIDECTOMY  02/2019   RIGHT/LEFT HEART CATH AND CORONARY ANGIOGRAPHY N/A 07/27/2020   Procedure: RIGHT/LEFT HEART CATH AND CORONARY ANGIOGRAPHY;  Surgeon: Tonny Bollman, MD;  Location: [facility] INVASIVE CV LAB;  Service: Cardiovascular;  Laterality: N/A;   TEE WITHOUT CARDIOVERSION N/A 08/10/2020   Procedure: TRANSESOPHAGEAL ECHOCARDIOGRAM (TEE);  Surgeon: Purcell Nails, MD;  Location: [facility] OR;  Service: Open Heart Surgery;  Laterality: N/A;   torn rotator cuff Right    Right shoulder   torn rotator cuff Left    left shoulder   TRIGGER FINGER RELEASE Left    thumb   TRIGGER FINGER RELEASE Right    thumb and middle finger   UPPER GASTROINTESTINAL ENDOSCOPY     WRIST SURGERY Left    torn ligament   WRIST SURGERY Right    cyst    Allergies  Allergen Reactions   Ace Inhibitors Cough   Farxiga [Dapagliflozin]     Yeast infections    Jardiance [Empagliflozin]     Yeast infections   Keflex [Cephalexin] Itching   Latex     Burn and itch   Metformin And Related Diarrhea   Nexium [Esomeprazole Magnesium] Diarrhea   Protonix [Pantoprazole Sodium]  Other (See Comments)    Constipation   Doxycycline Rash    Social History   Tobacco Use   Smoking status: Never   Smokeless tobacco: Never  Substance Use Topics   Alcohol use: Never    Family History  Problem Relation Age of Onset   Diabetes Mother    Heart Problems Mother    Heart Problems Maternal Grandfather    Colon cancer Paternal Grandmother    Esophageal cancer Neg Hx    Rectal cancer Neg Hx    Stomach cancer Neg Hx    Breast cancer Neg Hx    Prior to Admission medications   Medication Sig Start Date End Date Taking? Authorizing Provider  acetaminophen  (TYLENOL) 500 MG tablet Take 1,000 mg by mouth 2 (two) times daily.   Yes [provider]  aspirin EC 81 MG tablet Take 1 tablet (81 mg total) by mouth daily. Swallow whole. 09/14/20  Yes Baldo Daub, MD  Calcium Carb-Cholecalciferol (CALCIUM 600 + D PO) Take 2 tablets by mouth daily.   Yes [provider]  cetirizine (ZYRTEC) 10 MG tablet Take 10 mg by mouth daily.   Yes [provider]  chlorthalidone (HYGROTON) 50 MG tablet TAKE 1 TABLET BY MOUTH DAILY 02/17/23  Yes Cox, Kirsten, MD  famotidine (PEPCID) 40 MG tablet TAKE 1 TABLET BY MOUTH AT  BEDTIME 03/04/22  Yes Cox, Kirsten, MD  fluconazole (DIFLUCAN) 150 MG tablet Take 150 mg by mouth every 3 (three) days. 02/06/23  Yes [provider]  hydrocortisone (ANUSOL-HC) 25 MG suppository Place 1 suppository (25 mg total) rectally 2 (two) times daily. Patient taking differently: Place 25 mg rectally 2 (two) times daily as needed for hemorrhoids. 06/28/22  Yes Cox, Kirsten, MD  Melatonin 5 MG CAPS Take 5 mg by mouth at bedtime.   Yes [provider]  Multiple Vitamin (MULTIVITAMIN WITH MINERALS) TABS tablet Take 1 tablet by mouth daily.   Yes [provider]  Multiple Vitamins-Minerals (HAIR SKIN & NAILS) TABS Take 1 tablet by mouth daily.   Yes [provider]  nystatin ointment (MYCOSTATIN) Apply 1 Application topically 2 (two) times daily as needed (yeast / itching). Mix with triamcinolone   Yes [provider]  omeprazole (PRILOSEC) 40 MG capsule TAKE 1 CAPSULE BY MOUTH TWICE  DAILY BEFORE MEALS 05/27/22  Yes Cox, Kirsten, MD  pioglitazone (ACTOS) 15 MG tablet Take 15 mg by mouth daily.   Yes [provider]  potassium chloride SA (KLOR-CON M) 20 MEQ tablet TAKE 2 TABLETS BY MOUTH TWICE  DAILY 02/17/23  Yes Cox, Kirsten, MD  pravastatin (PRAVACHOL) 40 MG tablet TAKE 1 TABLET BY MOUTH AT  BEDTIME 01/19/23  Yes Cox, Kirsten, MD  PREMARIN vaginal cream APPLY TWICE A week.  10/16/22  Yes Cox, Kirsten, MD  PRESCRIPTION MEDICATION Place 1 Application rectally daily as needed (Hemorrhoids). NIFEDIPINE 5% ointment   Yes [provider]  traZODone (DESYREL) 100 MG tablet TAKE 1 TABLET BY MOUTH BEFORE  BEDTIME 04/29/22  Yes Cox, Kirsten, MD  valsartan (DIOVAN) 320 MG tablet TAKE 1 TABLET BY MOUTH DAILY 02/17/23  Yes Cox, Kirsten, MD  ACCU-CHEK GUIDE test strip USE 1 STRIP WITH METER DAILY IN  THE AFTERNOON 12/03/22   Windell Moment, MD  Accu-Chek Softclix Lancets lancets CHECK BLOOD SUGAR DAILY 08/22/22   Cox, Fritzi Mandes, MD  Blood Glucose Monitoring Suppl (ACCU-CHEK GUIDE ME) w/Device KIT Use as directed 09/19/20   Blane Ohara, MD  diltiazem (CARDIZEM) 30  MG tablet Take 1 tablet (30 mg total) by mouth every 6 (six) hours as needed (If your heart rate is greater than 100.). Patient not taking: Reported on 02/06/2023 05/03/22   Baldo Daub, MD  fluticasone [facility]) 50 MCG/ACT nasal spray Place 1 spray into the nose daily as needed for allergies.    [provider]  meloxicam (MOBIC) 15 MG tablet Take 1 tablet (15 mg total) by mouth daily. Patient not taking: Reported on 02/06/2023 02/04/23   CoxFritzi Mandes, MD  pioglitazone (ACTOS) 30 MG tablet Take 1 tablet (30 mg total) by mouth daily. 02/06/23   CoxFritzi Mandes, MD  triamcinolone ointment (KENALOG) 0.1 % Apply 1 Application topically 2 (two) times daily as needed (yeast / itching). Mix with nystatin    [provider]     Review of Systems  Positive ROS: neg  All other systems have been reviewed and were otherwise negative with the exception of those mentioned in the HPI and as above.  Objective: Vital signs in last 24 hours: Temp:  [97.6 F (36.4 C)] 97.6 F (36.4 C) (10/23 0751) Pulse Rate:  [72] 72 (10/23 0751) Resp:  [17] 17 (10/23 0751) BP: (161)/(65) 161/65 (10/23 0751) SpO2:  [97 %] 97 % (10/23 0751) Weight:  [98.4 kg] 98.4 kg (10/23 0751)  General Appearance: Alert, cooperative, no  distress, appears stated age Head: Normocephalic, without obvious abnormality, atraumatic Eyes: PERRL, conjunctiva/corneas clear, EOM's intact      Neck: Supple, symmetrical, trachea midline, Back: Symmetric, no curvature, ROM normal, no CVA tenderness Lungs:  respirations unlabored Heart: Regular rate and rhythm Abdomen: Soft, non-tender Extremities: Extremities normal, atraumatic, no cyanosis or edema Pulses: 2+ and symmetric all extremities Skin: Skin color, texture, turgor normal, no rashes or lesions  NEUROLOGIC:  Mental status: Alert and oriented x4, no aphasia, good attention span, fund of knowledge and memory  Motor Exam - grossly normal Sensory Exam - grossly normal Reflexes: 1+ Coordination - grossly normal Gait - grossly normal Balance - grossly normal Cranial Nerves: I: smell Not tested  II: visual acuity  OS: nl    OD: nl  II: visual fields Full to confrontation  II: pupils Equal, round, reactive to light  III,VII: ptosis None  III,IV,VI: extraocular muscles  Full ROM  V: mastication Normal  V: facial light touch sensation  Normal  V,VII: corneal reflex  Present  VII: facial muscle function - upper  Normal  VII: facial muscle function - lower Normal  VIII: hearing Not tested  IX: soft palate elevation  Normal  IX,X: gag reflex Present  XI: trapezius strength  5/5  XI: sternocleidomastoid strength 5/5  XI: neck flexion strength  5/5  XII: tongue strength  Normal    Data Review Lab Results  Component Value Date   WBC 4.7 02/04/2023   HGB 12.2 02/04/2023   HCT 37.6 02/04/2023   MCV 101 (H) 02/04/2023   PLT 222 02/04/2023   Lab Results  Component Value Date   NA 139 02/04/2023   K 3.8 02/04/2023   CL 97 02/04/2023   CO2 29 02/04/2023   BUN 15 02/04/2023   CREATININE 0.89 02/04/2023   GLUCOSE 135 (H) 02/04/2023   Lab Results  Component Value Date   INR 1.0 02/13/2023    Assessment:   Cervical neck pain with herniated nucleus pulposus/  spondylosis/ stenosis at C3-4. Estimated body mass index is 33.99 kg/m as calculated from the following:   Height as of this encounter: 5\' 7"  (  1.702 m).   Weight as of this encounter: 98.4 kg.  Patient has failed conservative therapy. Planned surgery : ACDF C3-4  Plan:   I explained the condition and procedure to the patient and answered any questions.  Patient wishes to proceed with procedure as planned. Understands risks/ benefits/ and expected or typical outcomes.  Tia Alert 02/19/2023 10:06 AM

## 2023-02-19 NOTE — Progress Notes (Signed)
Orthopedic Tech Progress Note Patient Details:  Megan Galvan 1950-05-16 [id number]  Ortho Devices Type of Ortho Device: Soft collar Ortho Device/Splint Interventions: Ordered, Application, Adjustment   Post Interventions Patient Tolerated: Well  Tonye Pearson 02/19/2023, 1:33 PM

## 2023-02-20 ENCOUNTER — Encounter (HOSPITAL_COMMUNITY): Payer: Self-pay | Admitting: Neurological Surgery

## 2023-02-20 DIAGNOSIS — Z7982 Long term (current) use of aspirin: Secondary | ICD-10-CM | POA: Diagnosis not present

## 2023-02-20 DIAGNOSIS — I10 Essential (primary) hypertension: Secondary | ICD-10-CM | POA: Diagnosis not present

## 2023-02-20 DIAGNOSIS — M4802 Spinal stenosis, cervical region: Secondary | ICD-10-CM | POA: Diagnosis not present

## 2023-02-20 DIAGNOSIS — Z79899 Other long term (current) drug therapy: Secondary | ICD-10-CM | POA: Diagnosis not present

## 2023-02-20 DIAGNOSIS — M4712 Other spondylosis with myelopathy, cervical region: Secondary | ICD-10-CM | POA: Diagnosis not present

## 2023-02-20 DIAGNOSIS — Z91040 Latex allergy status: Secondary | ICD-10-CM | POA: Diagnosis not present

## 2023-02-20 DIAGNOSIS — E119 Type 2 diabetes mellitus without complications: Secondary | ICD-10-CM | POA: Diagnosis not present

## 2023-02-20 LAB — GLUCOSE, CAPILLARY: Glucose-Capillary: 161 mg/dL — ABNORMAL HIGH (ref 70–99)

## 2023-02-20 MED ORDER — METHOCARBAMOL 750 MG PO TABS: 750.0000 mg | ORAL_TABLET | Freq: Four times a day (QID) | ORAL | 0 refills | Status: DC

## 2023-02-20 MED ORDER — OXYCODONE-ACETAMINOPHEN 5-325 MG PO TABS: 1.0000 | ORAL_TABLET | ORAL | 0 refills | Status: DC | PRN

## 2023-02-20 MED ORDER — TRAZODONE HCL 50 MG PO TABS
50.0000 mg | ORAL_TABLET | Freq: Every day | ORAL | Status: DC
Start: 2023-02-20 — End: 2023-02-20
  Administered 2023-02-20: 50 mg via ORAL
  Filled 2023-02-20: qty 1

## 2023-02-20 NOTE — Progress Notes (Signed)
Patient alert and oriented, void, ambulate. Surgical dressing removed by MD no drainage, no sign of infection. D/c instructions explain and given to the patient and husband both verbalized understanding, all questions answered Patient d/c home per order.

## 2023-02-20 NOTE — Discharge Summary (Signed)
Physician Discharge Summary  Patient ID: Megan Galvan MRN: [id number] DOB/AGE: [birth year] 71 y.o.  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 The patient tolerated the procedure well and was taken to the recovery room and then to the floor in stable condition. The hospital course was routine. There were no complications. The wound remained clean dry and intact. Pt had appropriate neck soreness. No complaints of arm pain or new N/T/W. The patient remained afebrile with stable vital signs, and tolerated a regular diet. The patient continued to increase activities, and pain was well controlled with oral pain medications.   Consults: None  Significant Diagnostic Studies:  Results for orders placed or performed during the hospital encounter of 02/19/23  Glucose, capillary  Result Value Ref Range   Glucose-Capillary 142 (H) 70 - 99 mg/dL  Glucose, capillary  Result Value Ref Range   Glucose-Capillary 170 (H) 70 - 99 mg/dL  Glucose, capillary  Result Value Ref Range   Glucose-Capillary 140 (H) 70 - 99 mg/dL  Glucose, capillary  Result Value Ref Range   Glucose-Capillary 196 (H) 70 - 99 mg/dL  Glucose, capillary  Result Value Ref Range   Glucose-Capillary 213 (H) 70 - 99 mg/dL   Comment 1 Notify RN    Comment 2 Document in Chart   Glucose, capillary  Result Value Ref Range   Glucose-Capillary 161 (H) 70 - 99 mg/dL   Comment 1 Notify RN    Comment 2 Document in Chart     DG Cervical Spine 1 View  Result Date: 02/19/2023 CLINICAL DATA:  Elective surgery. EXAM: DG CERVICAL SPINE - 1 VIEW COMPARISON:  None Available. FINDINGS: Single lateral fluoroscopic spot view of the  cervical spine obtained in the operating room. There is anterior fusion hardware at C3-C4 with interbody spacer. Fluoroscopy time 7 seconds. Dose 0.91 mGy. IMPRESSION: Intraoperative fluoroscopy during C3-C4 fusion. Electronically Signed   By: Narda Rutherford M.D.   On: 02/19/2023 14:43   DG C-Arm 1-60 Min-No Report  Result Date: 02/19/2023 Fluoroscopy was utilized by the requesting physician.  No radiographic interpretation.   DG C-Arm 1-60 Min-No Report  Result Date: 02/19/2023 Fluoroscopy was utilized by the requesting physician.  No radiographic interpretation.    Antibiotics:  Anti-infectives (From admission, onward)    Start     Dose/Rate Route Frequency Ordered Stop   02/19/23 2100  vancomycin (VANCOCIN) IVPB 1000 mg/200 mL premix        1,000 mg 200 mL/hr over 60 Minutes Intravenous  Once 02/19/23 1515 02/19/23 2153   02/19/23 0745  vancomycin (VANCOCIN) IVPB 1000 mg/200 mL premix        1,000 mg 200 mL/hr over 60 Minutes Intravenous On call to O.R. 02/19/23 0739 02/19/23 1002       Discharge Exam: Blood pressure (!) 130/51, pulse 96, temperature 98.4 F (36.9 C), temperature source Oral, resp. rate 16, height 5\' 7"  (1.702 m), weight 98.4 kg, SpO2 94%. Neurologic: Grossly normal Ambulating and voiding well incision cdi   Discharge Medications:   Allergies as of 02/20/2023       Reactions   Ace Inhibitors Cough   Farxiga [dapagliflozin]    Yeast infections   Jardiance [empagliflozin]    Yeast infections  Keflex [cephalexin] Itching   Latex    Burn and itch   Metformin And Related Diarrhea   Nexium [esomeprazole Magnesium] Diarrhea   Protonix [pantoprazole Sodium] Other (See Comments)   Constipation   Doxycycline Rash        Medication List     STOP taking these medications    meloxicam 15 MG tablet Commonly known as: MOBIC       TAKE these medications    Accu-Chek Guide Me w/Device Kit Use as directed   Accu-Chek Guide test strip Generic  drug: glucose blood USE 1 STRIP WITH METER DAILY IN  THE AFTERNOON   Accu-Chek Softclix Lancets lancets CHECK BLOOD SUGAR DAILY   acetaminophen 500 MG tablet Commonly known as: TYLENOL Take 1,000 mg by mouth 2 (two) times daily.   aspirin EC 81 MG tablet Take 1 tablet (81 mg total) by mouth daily. Swallow whole.   CALCIUM 600 + D PO Take 2 tablets by mouth daily.   cetirizine 10 MG tablet Commonly known as: ZYRTEC Take 10 mg by mouth daily.   chlorthalidone 50 MG tablet Commonly known as: HYGROTON TAKE 1 TABLET BY MOUTH DAILY   diltiazem 30 MG tablet Commonly known as: Cardizem Take 1 tablet (30 mg total) by mouth every 6 (six) hours as needed (If your heart rate is greater than 100.).   famotidine 40 MG tablet Commonly known as: PEPCID TAKE 1 TABLET BY MOUTH AT  BEDTIME   fluconazole 150 MG tablet Commonly known as: DIFLUCAN Take 150 mg by mouth every 3 (three) days.   fluticasone 50 MCG/ACT nasal spray Commonly known as: FLONASE Place 1 spray into the nose daily as needed for allergies.   Hair Skin & Nails Tabs Take 1 tablet by mouth daily.   hydrocortisone 25 MG suppository Commonly known as: ANUSOL-HC Place 1 suppository (25 mg total) rectally 2 (two) times daily. What changed:  when to take this reasons to take this   Melatonin 5 MG Caps Take 5 mg by mouth at bedtime.   methocarbamol 750 MG tablet Commonly known as: Robaxin-750 Take 1 tablet (750 mg total) by mouth 4 (four) times daily.   multivitamin with minerals Tabs tablet Take 1 tablet by mouth daily.   nystatin ointment Commonly known as: MYCOSTATIN Apply 1 Application topically 2 (two) times daily as needed (yeast / itching). Mix with triamcinolone   omeprazole 40 MG capsule Commonly known as: PRILOSEC TAKE 1 CAPSULE BY MOUTH TWICE  DAILY BEFORE MEALS   oxyCODONE-acetaminophen 5-325 MG tablet Commonly known as: PERCOCET/ROXICET Take 1 tablet by mouth every 4 (four) hours as needed for  severe pain (pain score 7-10).   pioglitazone 15 MG tablet Commonly known as: ACTOS Take 15 mg by mouth daily.   pioglitazone 30 MG tablet Commonly known as: Actos Take 1 tablet (30 mg total) by mouth daily.   potassium chloride SA 20 MEQ tablet Commonly known as: KLOR-CON M TAKE 2 TABLETS BY MOUTH TWICE  DAILY   pravastatin 40 MG tablet Commonly known as: PRAVACHOL TAKE 1 TABLET BY MOUTH AT  BEDTIME   Premarin vaginal cream Generic drug: conjugated estrogens APPLY TWICE A week.   PRESCRIPTION MEDICATION Place 1 Application rectally daily as needed (Hemorrhoids). NIFEDIPINE 5% ointment   traZODone 100 MG tablet Commonly known as: DESYREL TAKE 1 TABLET BY MOUTH BEFORE  BEDTIME   triamcinolone ointment 0.1 % Commonly known as: KENALOG Apply 1 Application topically 2 (two) times daily as needed (yeast / itching). Mix  with nystatin   valsartan 320 MG tablet Commonly known as: DIOVAN TAKE 1 TABLET BY MOUTH DAILY        Disposition: home   Final Dx: acdf C3-4  Discharge Instructions      Remove dressing in 72 hours   Complete by: As directed    Call MD for:   Complete by: As directed    Call MD for:  difficulty breathing, headache or visual disturbances   Complete by: As directed    Call MD for:  hives   Complete by: As directed    Call MD for:  persistant dizziness or light-headedness   Complete by: As directed    Call MD for:  persistant nausea and vomiting   Complete by: As directed    Call MD for:  redness, tenderness, or signs of infection (pain, swelling, redness, odor or green/yellow discharge around incision site)   Complete by: As directed    Call MD for:  severe uncontrolled pain   Complete by: As directed    Call MD for:  temperature >100.4   Complete by: As directed    Diet - low sodium heart healthy   Complete by: As directed    Driving Restrictions   Complete by: As directed    No driving for 2 weeks, no riding in the car for 1 week    Increase activity slowly   Complete by: As directed    Lifting restrictions   Complete by: As directed    No lifting more than 8 lbs          Signed: Tiana Loft Jameil Galvan 02/20/2023, 8:22 AM

## 2023-02-20 NOTE — Evaluation (Signed)
Occupational Therapy Evaluation Patient Details Name: Megan Galvan MRN: [id number] DOB: Sep 06, 1950 Today's Date: 02/20/2023   History of Present Illness Megan Galvan  72 y.o. female who underwent cervical discectomy C3-4, structural allograft and anterior cervical plating C3-4 utilizing a ATEC plate. PMHx: arthritis, caratract, obesity, osteoporosis, heart murmur, HTN, HLD< sleep apnea, DM II, vertigo   Clinical Impression   Megan Galvan was evaluated s/p the above spine surgery. She is mod I at baseline. Upon evaluation pt was limited by spinal precautions, neck/throat pain and decreased activity tolerance. Overall she needed up to CGA for bed mobility and ambulation without AD and min A for LB ADLs. Provided cues and education on spinal precautions and compensatory techniques throughout, handout provided and pt demonstrated good recall during ADLs and mobility. Pt does not require further acute OT services. Recommend d/c home with support of family.         If plan is discharge home, recommend the following: A little help with walking and/or transfers;A little help with bathing/dressing/bathroom;Assistance with cooking/housework;Assist for transportation    Functional Status Assessment  Patient has had a recent decline in their functional status and demonstrates the ability to make significant improvements in function in a reasonable and predictable amount of time.  Equipment Recommendations  None recommended by OT       Precautions / Restrictions Precautions Precautions: Fall;Cervical Precaution Booklet Issued: Yes (comment) Required Braces or Orthoses: Cervical Brace Cervical Brace: Soft collar;At all times Restrictions Weight Bearing Restrictions: No      Mobility Bed Mobility Overal bed mobility: Needs Assistance Bed Mobility: Rolling, Sidelying to Sit Rolling: Contact guard assist Sidelying to sit: Contact guard assist            Transfers Overall transfer level:  Needs assistance Equipment used: Rolling walker (2 wheels) Transfers: Sit to/from Stand Sit to Stand: Contact guard assist                  Balance Overall balance assessment: No apparent balance deficits (not formally assessed)                 ADL either performed or assessed with clinical judgement   ADL Overall ADL's : Needs assistance/impaired Eating/Feeding: Independent   Grooming: Supervision/safety   Upper Body Bathing: Set up   Lower Body Bathing: Minimal assistance   Upper Body Dressing : Set up   Lower Body Dressing: Minimal assistance   Toilet Transfer: Contact guard assist;Ambulation   Toileting- Clothing Manipulation and Hygiene: Supervision/safety       Functional mobility during ADLs: Contact guard assist General ADL Comments: cues provided for spinal precuations, min A needed for LB ADLs. Husband present and reports he can assist at d/c. No AD for room mobility or transfers.     Vision Baseline Vision/History: 1 Wears glasses Vision Assessment?: No apparent visual deficits     Perception Perception: Within Functional Limits       Praxis Praxis: WFL       Pertinent Vitals/Pain Pain Assessment Pain Assessment: Faces Faces Pain Scale: Hurts little more Pain Location: neck/throat Pain Descriptors / Indicators: Discomfort Pain Intervention(s): Limited activity within patient's tolerance, Monitored during session     Extremity/Trunk Assessment Upper Extremity Assessment Upper Extremity Assessment: Overall WFL for tasks assessed   Lower Extremity Assessment Lower Extremity Assessment: Generalized weakness   Cervical / Trunk Assessment Cervical / Trunk Assessment: Neck Surgery   Communication Communication Communication: No apparent difficulties   Cognition Arousal: Alert Behavior During Therapy: [facility]  for tasks assessed/performed Overall Cognitive Status: Within Functional Limits for tasks assessed               General  Comments  VSS on RA     Home Living Family/patient expects to be discharged to:: Private residence Living Arrangements: Spouse/significant other Available Help at Discharge: Family;Available 24 hours/day Type of Home: House Home Access: Level entry     Home Layout: Two level Alternate Level Stairs-Number of Steps: 1 step to get into kitchen   Bathroom Shower/Tub: Chief Strategy Officer: Standard     Home Equipment: Agricultural consultant (2 wheels);Cane - single point;BSC/3in1;Shower seat;Adaptive equipment Adaptive Equipment: Reacher        Prior Functioning/Environment Prior Level of Function : Independent/Modified Independent;Driving             Mobility Comments: no AD ADLs Comments: mod I        OT Problem List: Decreased strength;Decreased range of motion;Decreased activity tolerance;Impaired balance (sitting and/or standing);Decreased safety awareness;Decreased knowledge of use of DME or AE;Decreased knowledge of precautions      OT Treatment/Interventions: Self-care/ADL training;Therapeutic exercise;DME and/or AE instruction;Therapeutic activities;Patient/family education    OT Goals(Current goals can be found in the care plan section) Acute Rehab OT Goals Patient Stated Goal: home OT Goal Formulation: With patient Time For Goal Achievement: 03/06/23 Potential to Achieve Goals: Good  OT Frequency: Min 1X/week       AM-PAC OT "6 Clicks" Daily Activity     Outcome Measure Help from another person eating meals?: None Help from another person taking care of personal grooming?: A Little Help from another person toileting, which includes using toliet, bedpan, or urinal?: A Little Help from another person bathing (including washing, rinsing, drying)?: A Little Help from another person to put on and taking off regular upper body clothing?: A Little Help from another person to put on and taking off regular lower body clothing?: A Little 6 Click Score: 19    End of Session Equipment Utilized During Treatment: Gait belt;Cervical collar Nurse Communication: Mobility status  Activity Tolerance: Patient tolerated treatment well Patient left: in bed;with call bell/phone within reach;with family/visitor present  OT Visit Diagnosis: Other abnormalities of gait and mobility (R26.89);Unsteadiness on feet (R26.81);Muscle weakness (generalized) (M62.81)                Time: 8469-6295 OT Time Calculation (min): 17 min Charges:  OT General Charges $OT Visit: 1 Visit OT Evaluation $OT Eval Moderate Complexity: 1 Mod  Derenda Mis, OTR/L Acute Rehabilitation Services Office [phone] Secure Chat Communication Preferred   Donia Pounds 02/20/2023, 9:58 AM

## 2023-02-21 NOTE — Anesthesia Postprocedure Evaluation (Signed)
Anesthesia Post Note  Patient: ZIANN SKUBIC  Procedure(s) Performed: Anterior Cervical Discectomy Fusion-Cervical Three-Cervical Four, Removal Plate Cervical Four-Cervical Five (Spine Cervical)     Patient location during evaluation: PACU Anesthesia Type: General Level of consciousness: awake and alert Pain management: pain level controlled Vital Signs Assessment: post-procedure vital signs reviewed and stable Respiratory status: spontaneous breathing, nonlabored ventilation, respiratory function stable and patient connected to nasal cannula oxygen Cardiovascular status: blood pressure returned to baseline and stable Postop Assessment: no apparent nausea or vomiting Anesthetic complications: no   No notable events documented.  Last Vitals:  Vitals:   02/20/23 0407 02/20/23 0747  BP: (!) 156/56 (!) 130/51  Pulse: 92 96  Resp: 18 16  Temp: 37.1 C 36.9 C  SpO2: 95% 94%    Last Pain:  Vitals:   02/20/23 0931  TempSrc:   PainSc: 6    Pain Goal: Patients Stated Pain Goal: 2 (02/20/23 0931)                 Romie Jumper L [place]

## 2023-02-22 ENCOUNTER — Other Ambulatory Visit: Payer: Self-pay | Admitting: Family Medicine

## 2023-03-04 ENCOUNTER — Other Ambulatory Visit: Payer: Self-pay | Admitting: Family Medicine

## 2023-03-04 DIAGNOSIS — M5412 Radiculopathy, cervical region: Secondary | ICD-10-CM | POA: Diagnosis not present

## 2023-03-04 DIAGNOSIS — G4733 Obstructive sleep apnea (adult) (pediatric): Secondary | ICD-10-CM | POA: Diagnosis not present

## 2023-03-09 ENCOUNTER — Other Ambulatory Visit: Payer: Self-pay | Admitting: Family Medicine

## 2023-03-10 ENCOUNTER — Telehealth: Payer: Self-pay

## 2023-03-10 ENCOUNTER — Other Ambulatory Visit: Payer: Self-pay | Admitting: Family Medicine

## 2023-03-10 MED ORDER — POTASSIUM CHLORIDE ER 10 MEQ PO CPCR: 40.0000 meq | ORAL_CAPSULE | Freq: Two times a day (BID) | ORAL | 0 refills | Status: DC

## 2023-03-10 NOTE — Telephone Encounter (Signed)
  Copied from CRM [id number]. Topic: Clinical - Medication Question >> Mar 10, 2023 11:26 AM Megan Galvan wrote: Reason for CRM: not able to swallow medication / pt is asking for a different form of potassium chloride SA (KLOR-CON M) 20 MEQ tablet

## 2023-03-11 ENCOUNTER — Encounter: Payer: Self-pay | Admitting: Gastroenterology

## 2023-03-17 ENCOUNTER — Encounter: Payer: Medicare Other | Admitting: Gastroenterology

## 2023-03-18 DIAGNOSIS — M5412 Radiculopathy, cervical region: Secondary | ICD-10-CM | POA: Diagnosis not present

## 2023-03-20 ENCOUNTER — Telehealth: Payer: Self-pay | Admitting: Gastroenterology

## 2023-03-20 NOTE — Telephone Encounter (Signed)
Inbound call from patient requesting to speak with a nurse in regards to rectal bleeding. Please advise.

## 2023-03-21 ENCOUNTER — Telehealth: Payer: Self-pay

## 2023-03-21 NOTE — Telephone Encounter (Signed)
Returned patient call & she stated she's been having a small amount of rectal bleeding for the last 2 days. States she has rectal pain/irritation all the time. She's been using prescription cream & suppositories that Dr. Chales Abrahams prescribed with no relief. Pt would like to know if there is anything else she can take. Last seen for OV 05/2022 with Dr. Chales Abrahams.

## 2023-03-21 NOTE — Telephone Encounter (Signed)
Marland Kitchen  pap

## 2023-03-21 NOTE — Telephone Encounter (Signed)
  Spoke to patient about re-enrollment for patient assistance for [employer]) and [facility]). Pt. States she is no longer taking these medications due to side effects. PAP is not needed for 2025.

## 2023-03-24 NOTE — Telephone Encounter (Signed)
Agree

## 2023-03-31 ENCOUNTER — Other Ambulatory Visit: Payer: Self-pay | Admitting: Family Medicine

## 2023-03-31 DIAGNOSIS — Z1231 Encounter for screening mammogram for malignant neoplasm of breast: Secondary | ICD-10-CM

## 2023-04-09 ENCOUNTER — Other Ambulatory Visit: Payer: Self-pay | Admitting: Family Medicine

## 2023-04-09 ENCOUNTER — Ambulatory Visit
Admission: RE | Admit: 2023-04-09 | Discharge: 2023-04-09 | Disposition: A | Discharge status: Home / Self Care | Payer: Medicare Other | Source: Ambulatory Visit | Attending: Family Medicine | Admitting: Family Medicine

## 2023-04-09 DIAGNOSIS — Z1231 Encounter for screening mammogram for malignant neoplasm of breast: Secondary | ICD-10-CM | POA: Diagnosis not present

## 2023-04-09 NOTE — Telephone Encounter (Signed)
Retrial of diltiazem 2% with lidocaine ointment -apply TID for 8 weeks. Plan as per last clinic note. Needs colonoscopy as per last clinic note RG

## 2023-04-10 ENCOUNTER — Telehealth: Payer: Self-pay

## 2023-04-10 ENCOUNTER — Telehealth: Payer: Self-pay | Admitting: Gastroenterology

## 2023-04-10 ENCOUNTER — Other Ambulatory Visit: Payer: Self-pay

## 2023-04-10 MED ORDER — AMBULATORY NON FORMULARY MEDICATION: 0 refills | Status: AC

## 2023-04-10 NOTE — Telephone Encounter (Signed)
Unable to reach patient, line busy. Will try again at a later time.

## 2023-04-10 NOTE — Telephone Encounter (Signed)
[facility] Pre-operative Risk Assessment     Request for surgical clearance:     Endoscopy Procedure  What type of surgery is being performed?     Colonoscopy  When is this surgery scheduled?     06/13/23  What type of clearance is required ?   Cardiac  Are there any medications that need to be held prior to surgery and how long?   Practice name and name of physician performing surgery?      [facility]; Dr. Chales Abrahams  What is your office phone and fax number?      Phone- [phone]  Fax- [phone]  Anesthesia type (None, local, MAC, general) ?       MAC   Please route your response to Sharyon Medicus, RN

## 2023-04-10 NOTE — Telephone Encounter (Signed)
Called and spoke to patient scheduled telephone appointment for clearance on 1/24 at 10 am patient voiced understanding consent and med rec are done.. SN      Patient Consent for Virtual Visit        Megan Galvan has provided verbal consent on 04/10/2023 for a virtual visit (video or telephone).   CONSENT FOR VIRTUAL VISIT FOR:  Megan Galvan  By participating in this virtual visit I agree to the following:  I hereby voluntarily request, consent and authorize [facility] and its employed or contracted physicians, physician assistants, nurse practitioners or other licensed health care professionals (the Practitioner), to provide me with telemedicine health care services (the "Services") as deemed necessary by the treating Practitioner. I acknowledge and consent to receive the Services by the Practitioner via telemedicine. I understand that the telemedicine visit will involve communicating with the Practitioner through live audiovisual communication technology and the disclosure of certain medical information by electronic transmission. I acknowledge that I have been given the opportunity to request an in-person assessment or other available alternative prior to the telemedicine visit and am voluntarily participating in the telemedicine visit.  I understand that I have the right to withhold or withdraw my consent to the use of telemedicine in the course of my care at any time, without affecting my right to future care or treatment, and that the Practitioner or I may terminate the telemedicine visit at any time. I understand that I have the right to inspect all information obtained and/or recorded in the course of the telemedicine visit and may receive copies of available information for a reasonable fee.  I understand that some of the potential risks of receiving the Services via telemedicine include:  Delay or interruption in medical evaluation due to technological equipment failure  or disruption; Information transmitted may not be sufficient (e.g. poor resolution of images) to allow for appropriate medical decision making by the Practitioner; and/or  In rare instances, security protocols could fail, causing a breach of personal health information.  Furthermore, I acknowledge that it is my responsibility to provide information about my medical history, conditions and care that is complete and accurate to the best of my ability. I acknowledge that Practitioner's advice, recommendations, and/or decision may be based on factors not within their control, such as incomplete or inaccurate data provided by me or distortions of diagnostic images or specimens that may result from electronic transmissions. I understand that the practice of medicine is not an exact science and that Practitioner makes no warranties or guarantees regarding treatment outcomes. I acknowledge that a copy of this consent can be made available to me via my patient portal [facility] MyChart), or I can request a printed copy by calling the office of  [facility].    I understand that my insurance will be billed for this visit.   I have read or had this consent read to me. I understand the contents of this consent, which adequately explains the benefits and risks of the Services being provided via telemedicine.  I have been provided ample opportunity to ask questions regarding this consent and the Services and have had my questions answered to my satisfaction. I give my informed consent for the services to be provided through the use of telemedicine in my medical care

## 2023-04-10 NOTE — Telephone Encounter (Signed)
Spoke with patient regarding MD recommendations. Prescription faxed to [facility] pharmacy, and advised patient to call if she has any trouble picking up medication. PV rescheduled for 05/26/23 at 11:00 am & colon for 06/13/23 at 10:00 am with Dr. Chales Abrahams. Cardiac clearance in alternate phone note from today (04/10/23). She is not currently on any blood thinners.

## 2023-04-10 NOTE — Telephone Encounter (Signed)
Called and spoke to patient regarding clearance patient is scheduled for tele visit on Jan 14 at 10 am patient voiced understanding.

## 2023-04-10 NOTE — Telephone Encounter (Signed)
[facility] rep called and stated that they no longer do the solution compound for Diltiazem with lidocaine. Pharmacy rep stated for that prescription to be sent elsewhere. Please advise.

## 2023-04-10 NOTE — Telephone Encounter (Signed)
   Name: Megan Galvan  DOB: 03/15/1951  MRN: [id number]  Primary Cardiologist: None   Preoperative team, please contact this patient and set up a phone call appointment for further preoperative risk assessment. Please obtain consent and complete medication review. Thank you for your help.Last seen by Dr. Dulce Sellar in July of 2024.  I confirm that guidance regarding antiplatelet and oral anticoagulation therapy has been completed and, if necessary, noted below.  Per office protocol, if patient is without any new symptoms or concerns at the time of their virtual visit, he/she may hold ASA for 7 days prior to procedure. Please resume ASA as soon as possible postprocedure, at the discretion of the surgeon.    I also confirmed the patient resides in the state of [place]. As per [facility] Medical Board telemedicine laws, the patient must reside in the state in which the provider is licensed.   Joni Reining, NP 04/10/2023, 10:57 AM [facility]

## 2023-04-10 NOTE — Telephone Encounter (Signed)
Spoke with patient & advised her I would send prescription to [facility], and to call back if she has any trouble picking up medication. Pt verbalized all understanding.

## 2023-04-15 DIAGNOSIS — M5412 Radiculopathy, cervical region: Secondary | ICD-10-CM | POA: Diagnosis not present

## 2023-04-29 ENCOUNTER — Ambulatory Visit: Payer: Self-pay | Admitting: Family Medicine

## 2023-04-29 DIAGNOSIS — R300 Dysuria: Secondary | ICD-10-CM | POA: Diagnosis not present

## 2023-04-29 DIAGNOSIS — N3091 Cystitis, unspecified with hematuria: Secondary | ICD-10-CM | POA: Diagnosis not present

## 2023-04-29 NOTE — Telephone Encounter (Signed)
  Chief Complaint: Urinary symptoms Symptoms: burning with urination, increase urinary frequency, R flank pain Frequency: 3 days, becoming constant Pertinent Negatives: Patient denies blood in urine, fever Disposition: [] ED /[x] Urgent Care (no appt availability in office) / [] Appointment(In office/virtual)/ []  [facility]/ [] Home Care/ [x] Refused Recommended Disposition /[] [facility]/ []  Follow-up with PCP Additional Notes: Patient calls stating that she has increased urinary frequency, R flank pain,  burning with urination, as well as pain with urination x3 days. Patient denies fever, blood in urine, nausea, vomiting or diarrhea. She reports a history of UTI and states this feels similar. Per protocol, pt to be evaluated within 4 hours. Due to holiday, no availability in clinic. Patient advised to be evaluated at urgent care within 4, patient states she may go there tomorrow for evaluation, but not today. Care advice reviewed, pt verbalized understanding.  Alerting PCP for review.   Copied from CRM [id number]. Topic: Clinical - Red Word Triage >> Apr 29, 2023  3:15 PM Chase C wrote: Patient called in to schedule an appointment but she mentioned that she think she has a bladder infection because she is burning and itching for the past 3 days now. Transferred to Nurse Triage Line. Reason for Disposition  Diabetes mellitus or weak immune system (e.g., HIV positive, cancer chemo, splenectomy, organ transplant, chronic steroids)  Answer Assessment - Initial Assessment Questions 1. SYMPTOM: What's the main symptom you're concerned about? (e.g., frequency, incontinence)     Increased urinary frequency, burning with urination, R side flank pain 2. ONSET: When did the  symptoms  start?     3 days 3. PAIN: Is there any pain? If Yes, ask: How bad is it? (Scale: 1-10; mild, moderate, severe)     5/10, intermittent 4. CAUSE: What do you think is causing the symptoms?      UTI 5. OTHER SYMPTOMS: Do you have any other symptoms? (e.g., blood in urine, fever, flank pain, pain with urination)     Flank pain, pain with urination  Answer Assessment - Initial Assessment Questions 1. SEVERITY: How bad is the pain?  (e.g., Scale 1-10; mild, moderate, or severe)   - MILD (1-3): complains slightly about urination hurting   - MODERATE (4-7): interferes with normal activities     - SEVERE (8-10): excruciating, unwilling or unable to urinate because of the pain      5/10 2. FREQUENCY: How many times have you had painful urination today?      Last hour has been twice 3. PATTERN: Is pain present every time you urinate or just sometimes?      Every time 4. ONSET: When did the painful urination start?      3 days 5. FEVER: Do you have a fever? If Yes, ask: What is your temperature, how was it measured, and when did it start?     Denies 6. PAST UTI: Have you had a urine infection before? If Yes, ask: When was the last time? and What happened that time?      Yes, abx treatment 7. CAUSE: What do you think is causing the painful urination?  (e.g., UTI, scratch, Herpes sore)     UTI 8. OTHER SYMPTOMS: Do you have any other symptoms? (e.g., blood in urine, flank pain, genital sores, urgency, vaginal discharge)     R side flank pain  Protocols used: Urinary Symptoms-A-AH, Urination Pain - Female-A-AH

## 2023-05-06 DIAGNOSIS — M6281 Muscle weakness (generalized): Secondary | ICD-10-CM | POA: Diagnosis not present

## 2023-05-06 DIAGNOSIS — M542 Cervicalgia: Secondary | ICD-10-CM | POA: Diagnosis not present

## 2023-05-08 DIAGNOSIS — M6281 Muscle weakness (generalized): Secondary | ICD-10-CM | POA: Diagnosis not present

## 2023-05-08 DIAGNOSIS — M542 Cervicalgia: Secondary | ICD-10-CM | POA: Diagnosis not present

## 2023-05-15 DIAGNOSIS — M542 Cervicalgia: Secondary | ICD-10-CM | POA: Diagnosis not present

## 2023-05-15 DIAGNOSIS — M6281 Muscle weakness (generalized): Secondary | ICD-10-CM | POA: Diagnosis not present

## 2023-05-19 NOTE — Progress Notes (Unsigned)
Subjective:  Patient ID: Megan Galvan, female    DOB: [birth year]  Age: 73 y.o. MRN: [id number]  Chief Complaint  Patient presents with   Medical Management of Chronic Issues    HPI The patient, with a history of recurrent urinary tract infections and vaginal infections, presents with ongoing symptoms of a UTI that was treated at an urgent care center two weeks ago. The patient reports persistent itching and malodorous urine despite completing a seven-day course of nitrofurantoin. The patient has a history of seeing a urologist, who mentioned a possible kidney issue but did not specify further. The patient also reports recent onset of hand swelling and pain, which she describes as an ache. This has been ongoing for the past few weeks. The patient also mentions a history of kidney stones. Diabetes: She presents for her follow-up diabetic visit. She has type 2 diabetes mellitus.  Complications: diabetic polyneuropathy. Glucose checking:  daily  Glucose logs: Blood sugar range 130-140s  Hypoglycemia:none Most recent A1C: 7.1 Current medications: Actos 15 mg daily.   Last Eye Exam: 10/2022.   Foot checks: daily. Diet: trying. Eating more fruit. Avoiding carbs/sweets.  Exercise: none.   Hyperlipidemia: Current medications:  Pravastatin 40 mg daily    Hypertension: Current medications:  Chlorthalidone 50 mg daily, Valsartan 320 mg daily   GERD: well controlled on famotidine and omeprazole twice daily.    OSA: uses cpap. Compliant and she feels like it helps.   Has Colonoscopy scheduled for next month.      05/20/2023    9:55 AM 10/16/2022    9:58 AM 09/18/2022   11:03 AM 08/27/2022    3:28 PM 06/28/2022   10:15 AM  Depression screen PHQ 2/9  Decreased Interest 1 0 0 0 0  Down, Depressed, Hopeless 1 0 0 0 0  PHQ - 2 Score 2 0 0 0 0  Altered sleeping 1 0     Tired, decreased energy 1 1     Change in appetite 0 0     Feeling bad or failure about yourself  0 0     Trouble  concentrating 0 0     Moving slowly or fidgety/restless 0 0     Suicidal thoughts 0 0     PHQ-9 Score 4 1     Difficult doing work/chores Somewhat difficult Not difficult at all           05/20/2023    9:55 AM  Fall Risk   Falls in the past year? 0  Number falls in past yr: 0  Injury with Fall? 0  Risk for fall due to : No Fall Risks  Follow up Falls evaluation completed    Patient Care Team: Blane Ohara, MD as PCP - General (Family Medicine) Zettie Pho, [facility] (Inactive) as Pharmacist (Pharmacist) Lynann Bologna, MD as Consulting Physician (Gastroenterology) Baldo Daub, MD as Consulting Physician (Cardiology) Birdie Sons, OD (Ophthalmology)   Review of Systems  Constitutional:  Negative for chills, fatigue and fever.  HENT:  Negative for congestion, ear pain, rhinorrhea and sore throat.   Respiratory:  Negative for cough and shortness of breath.   Cardiovascular:  Negative for chest pain.  Gastrointestinal:  Negative for abdominal pain, constipation, diarrhea, nausea and vomiting.  Genitourinary:  Negative for dysuria and urgency.       Vaginal itching  Musculoskeletal:  Negative for back pain and myalgias.  Neurological:  Negative for dizziness, weakness, light-headedness and headaches.  Psychiatric/Behavioral:  Negative for dysphoric mood. The patient is not nervous/anxious.     Current Outpatient Medications on File Prior to Visit  Medication Sig Dispense Refill   ACCU-CHEK GUIDE test strip USE 1 STRIP WITH METER DAILY IN  THE AFTERNOON 100 strip 2   Accu-Chek Softclix Lancets lancets CHECK BLOOD SUGAR DAILY 100 each 2   acetaminophen (TYLENOL) 500 MG tablet Take 1,000 mg by mouth 2 (two) times daily.     AMBULATORY NON FORMULARY MEDICATION Diltiazem 2% compounded with lidocaine 5% ointment applied to the rectum 3 times daily for 8 weeks. 1 each 0   aspirin EC 81 MG tablet Take 1 tablet (81 mg total) by mouth daily. Swallow whole. 90 tablet 3   Blood  Glucose Monitoring Suppl (ACCU-CHEK GUIDE ME) w/Device KIT Use as directed 1 kit 0   Calcium Carb-Cholecalciferol (CALCIUM 600 + D PO) Take 2 tablets by mouth daily.     cetirizine (ZYRTEC) 10 MG tablet Take 10 mg by mouth daily.     chlorthalidone (HYGROTON) 50 MG tablet TAKE 1 TABLET BY MOUTH DAILY 100 tablet 2   famotidine (PEPCID) 40 MG tablet TAKE 1 TABLET BY MOUTH AT  BEDTIME 100 tablet 0   fluticasone (FLONASE) 50 MCG/ACT nasal spray Place 1 spray into the nose daily as needed for allergies.     Melatonin 5 MG CAPS Take 5 mg by mouth at bedtime.     methocarbamol (ROBAXIN-750) 750 MG tablet Take 1 tablet (750 mg total) by mouth 4 (four) times daily. 45 tablet 0   Multiple Vitamin (MULTIVITAMIN WITH MINERALS) TABS tablet Take 1 tablet by mouth daily.     Multiple Vitamins-Minerals (HAIR SKIN & NAILS) TABS Take 1 tablet by mouth daily.     nystatin ointment (MYCOSTATIN) Apply 1 Application topically 2 (two) times daily as needed (yeast / itching). Mix with triamcinolone     omeprazole (PRILOSEC) 40 MG capsule TAKE 1 CAPSULE BY MOUTH TWICE  DAILY BEFORE MEALS 200 capsule 2   oxyCODONE-acetaminophen (PERCOCET/ROXICET) 5-325 MG tablet Take 1 tablet by mouth every 4 (four) hours as needed for severe pain (pain score 7-10). 30 tablet 0   pioglitazone (ACTOS) 15 MG tablet TAKE 1 TABLET(15 MG) BY MOUTH DAILY 90 tablet 1   pioglitazone (ACTOS) 30 MG tablet TAKE 1 TABLET BY MOUTH DAILY 90 tablet 2   potassium chloride (MICRO-K) 10 MEQ CR capsule Take 4 capsules (40 mEq total) by mouth 2 (two) times daily. 240 capsule 0   pravastatin (PRAVACHOL) 40 MG tablet TAKE 1 TABLET BY MOUTH AT  BEDTIME 100 tablet 2   PRESCRIPTION MEDICATION Place 1 Application rectally daily as needed (Hemorrhoids). NIFEDIPINE 5% ointment     traZODone (DESYREL) 100 MG tablet TAKE 1 TABLET BY MOUTH BEFORE  BEDTIME 90 tablet 3   triamcinolone ointment (KENALOG) 0.1 % Apply 1 Application topically 2 (two) times daily as needed  (yeast / itching). Mix with nystatin     valsartan (DIOVAN) 320 MG tablet TAKE 1 TABLET BY MOUTH DAILY 100 tablet 2   No current facility-administered medications on file prior to visit.   Past Medical History:  Diagnosis Date   Allergic sinusitis 08/27/2022   Arthritis    Atrophic vaginitis 03/31/2021   Cataract    Class 1 obesity due to excess calories with serious comorbidity and body mass index (BMI) of 33.0 to 33.9 in adult 03/28/2022   Encounter for osteoporosis screening in asymptomatic postmenopausal patient 06/28/2022   External hemorrhoids 11/12/2021  Gastro-esophageal reflux disease without esophagitis 06/03/2019   Heart murmur    AVR   History of kidney stones    Hypertension associated with diabetes (HCC) 10/01/2019   Mixed hyperlipidemia    Mucositis (ulcerative) of vagina and vulva 06/21/2022   Neck pain, chronic 10/18/2022   Primary insomnia 06/03/2019   S/P aortic valve replacement with bioprosthetic valve 08/10/2020   21 mm Edwards Resilia Inspiria Bioprosthetic Tissue Valve  SN [id number] Model [id number]   Sleep apnea    cpap   Type 2 diabetes mellitus with diabetic polyneuropathy, without long-term current use of insulin (HCC) 06/03/2019   Vertigo 07/20/2019   Past Surgical History:  Procedure Laterality Date   ANTERIOR CERVICAL DECOMP/DISCECTOMY FUSION     x 2   ANTERIOR CERVICAL DECOMP/DISCECTOMY FUSION N/A 02/19/2023   Procedure: Anterior Cervical Discectomy Fusion-Cervical Three-Cervical Four, Removal Plate Cervical Four-Cervical Five;  Surgeon: Arman Bogus, MD;  Location: [facility] OR;  Service: Neurosurgery;  Laterality: N/A;  3C   AORTIC VALVE REPLACEMENT N/A 08/10/2020   Procedure: AORTIC VALVE REPLACEMENT (AVR) USING INSPIRIS VALVE SIZE ;  Surgeon: Purcell Nails, MD;  Location: [facility] OR;  Service: Open Heart Surgery;  Laterality: N/A;   BACK SURGERY  11/17/2016   L3-L5   BILATERAL CARPAL TUNNEL RELEASE     bone spur Left    left shoulder   bone  spur Right    Right shoulder   CATARACT EXTRACTION Left 09/18/2021   COLONOSCOPY  08/22/2011   Moderate predominantly sigmoid diverticulosis. Small internal hemorrhoids.    COLONOSCOPY     HEMORROIDECTOMY  02/2019   RIGHT/LEFT HEART CATH AND CORONARY ANGIOGRAPHY N/A 07/27/2020   Procedure: RIGHT/LEFT HEART CATH AND CORONARY ANGIOGRAPHY;  Surgeon: Tonny Bollman, MD;  Location: [facility] INVASIVE CV LAB;  Service: Cardiovascular;  Laterality: N/A;   TEE WITHOUT CARDIOVERSION N/A 08/10/2020   Procedure: TRANSESOPHAGEAL ECHOCARDIOGRAM (TEE);  Surgeon: Purcell Nails, MD;  Location: [facility] OR;  Service: Open Heart Surgery;  Laterality: N/A;   torn rotator cuff Right    Right shoulder   torn rotator cuff Left    left shoulder   TRIGGER FINGER RELEASE Left    thumb   TRIGGER FINGER RELEASE Right    thumb and middle finger   UPPER GASTROINTESTINAL ENDOSCOPY     WRIST SURGERY Left    torn ligament   WRIST SURGERY Right    cyst    Family History  Problem Relation Age of Onset   Diabetes Mother    Heart Problems Mother    Heart Problems Maternal Grandfather    Colon cancer Paternal Grandmother    Esophageal cancer Neg Hx    Rectal cancer Neg Hx    Stomach cancer Neg Hx    Breast cancer Neg Hx    Social History   Socioeconomic History   Marital status: Married    Spouse name: Eddie   Number of children: 2   Years of education: Not on file   Highest education level: Not on file  Occupational History   Not on file  Tobacco Use   Smoking status: Never   Smokeless tobacco: Never  Vaping Use   Vaping status: Never Used  Substance and Sexual Activity   Alcohol use: Never   Drug use: Never   Sexual activity: Yes    Birth control/protection: Post-menopausal  Other Topics Concern   Not on file  Social History Narrative   One son lives in home, the other son lives about 3  miles away   Social Drivers of Health   Financial Resource Strain: Low Risk  (09/18/2022)   Overall Financial  Resource Strain (CARDIA)    Difficulty of Paying Living Expenses: Not very hard  Food Insecurity: No Food Insecurity (09/18/2022)   Hunger Vital Sign    Worried About Running Out of Food in the Last Year: Never true    Ran Out of Food in the Last Year: Never true  Transportation Needs: No Transportation Needs (09/18/2022)   PRAPARE - Administrator, Civil Service (Medical): No    Lack of Transportation (Non-Medical): No  Physical Activity: Inactive (09/18/2022)   Exercise Vital Sign    Days of Exercise per Week: 0 days    Minutes of Exercise per Session: 0 min  Stress: No Stress Concern Present (09/18/2022)   [employer] of Occupational Health - Occupational Stress Questionnaire    Feeling of Stress : Not at all  Social Connections: Moderately Integrated (09/18/2022)   Social Connection and Isolation Panel [NHANES]    Frequency of Communication with Friends and Family: More than three times a week    Frequency of Social Gatherings with Friends and Family: More than three times a week    Attends Religious Services: More than 4 times per year    Active Member of [employer] or Organizations: No    Attends Engineer, structural: Never    Marital Status: Married    Objective:  BP 130/72   Pulse 74   Temp (!) 97.1 F (36.2 C)   Ht 5\' 7"  (1.702 m)   Wt 221 lb (100.2 kg)   SpO2 94%   BMI 34.61 kg/m      05/20/2023    9:44 AM 02/20/2023    7:47 AM 02/20/2023    4:07 AM  BP/Weight  Systolic BP 130 130 156  Diastolic BP 72 51 56  Wt. (Lbs) 221    BMI 34.61 kg/m2      Physical Exam Vitals reviewed.  Constitutional:      Appearance: Normal appearance. She is obese.  Neck:     Vascular: No carotid bruit.  Cardiovascular:     Rate and Rhythm: Normal rate and regular rhythm.     Heart sounds: Normal heart sounds.  Pulmonary:     Effort: Pulmonary effort is normal. No respiratory distress.     Breath sounds: Normal breath sounds.  Abdominal:     General:  Abdomen is flat. Bowel sounds are normal.     Palpations: Abdomen is soft.     Tenderness: There is no abdominal tenderness.  Neurological:     Mental Status: She is alert and oriented to person, place, and time.  Psychiatric:        Mood and Affect: Mood normal.        Behavior: Behavior normal.     Diabetic Foot Exam - Simple   Simple Foot Form Diabetic Foot exam was performed with the following findings: Yes 05/20/2023 10:24 AM  Visual Inspection No deformities, no ulcerations, no other skin breakdown bilaterally: Yes Sensation Testing Intact to touch and monofilament testing bilaterally: Yes Pulse Check Posterior Tibialis and Dorsalis pulse intact bilaterally: Yes Comments      Lab Results  Component Value Date   WBC 4.7 02/04/2023   HGB 12.2 02/04/2023   HCT 37.6 02/04/2023   PLT 222 02/04/2023   GLUCOSE 135 (H) 02/04/2023   CHOL 132 02/04/2023   TRIG 98 02/04/2023   HDL 44  02/04/2023   LDLCALC 70 02/04/2023   ALT 25 02/04/2023   AST 31 02/04/2023   NA 139 02/04/2023   K 3.8 02/04/2023   CL 97 02/04/2023   CREATININE 0.89 02/04/2023   BUN 15 02/04/2023   CO2 29 02/04/2023   TSH 2.100 02/04/2023   INR 1.0 02/13/2023   HGBA1C 7.1 (H) 02/04/2023   MICROALBUR 10 12/13/2020      Assessment & Plan:    Type 2 diabetes mellitus with diabetic polyneuropathy, without long-term current use of insulin (HCC) Assessment & Plan: Fairly well controlled.  Last A1C was 7.1, on Actos due to intolerance to multiple other diabetes medications. -Continue current management and monitor blood glucose levels. Recommend check feet daily. Recommend annual eye exams. Continue to work on eating a healthy diet and exercise.  Labs drawn today.     Orders: -     Hemoglobin A1c -     Microalbumin / creatinine urine ratio  Essential hypertension, benign Assessment & Plan: Well controlled.  On Chlorthalidone and Valsartan. -Continue current management.  Orders: -     CBC  with Differential/Platelet -     Comprehensive metabolic panel  Mixed hyperlipidemia Assessment & Plan: The current medical regimen is effective;  continue present plan and medications. On Pravastatin.  Orders: -     Lipid panel  Gastro-esophageal reflux disease without esophagitis Assessment & Plan: The current medical regimen is effective;  continue present plan and medications.  Continue famotidine and omeprazole twice daily.     Atrophic vaginitis Assessment & Plan: Improved.  Continue premarin cream.  Orders: -     Premarin; APPLY TWICE A week.  Dispense: 42.5 g; Refill: 1  Pain in other joint Assessment & Plan: Check labs. Rule out rheumatoid arthritis.   Orders: -     Rheumatoid factor -     CYCLIC CITRUL PEPTIDE ANTIBODY, IGG/IGA -     Sedimentation rate -     C-reactive protein -     ANA w/Reflex  Acute cystitis without hematuria Assessment & Plan: Recurrent utis.  Start on bactrim ds.  Follow up for recheck UA in 2 weeks.  Consider prophylactic trimethoprim at that time.  Orders: -     POCT URINALYSIS DIP (CLINITEK) -     Urine Culture -     Fluconazole; Take 1 tablet (150 mg total) by mouth daily.  Dispense: 1 tablet; Refill: 0 -     Sulfamethoxazole-Trimethoprim; Take 1 tablet by mouth 2 (two) times daily.  Dispense: 14 tablet; Refill: 0  Other orders -     Hydrocortisone Acetate; Place 1 suppository (25 mg total) rectally 2 (two) times daily.  Dispense: 30 suppository; Refill: 2    General Health Maintenance / Followup Plans -Schedule colonoscopy with Dr. Chales Abrahams. -Refill Premarin vaginal cream and advise to continue use twice a week. -Check-in post-colonoscopy. -Encourage patient to use MyChart for easier communication and prescription refills.  Meds ordered this encounter  Medications   hydrocortisone (ANUSOL-HC) 25 MG suppository    Sig: Place 1 suppository (25 mg total) rectally 2 (two) times daily.    Dispense:  30 suppository    Refill:   2   PREMARIN vaginal cream    Sig: APPLY TWICE A week.    Dispense:  42.5 g    Refill:  1   fluconazole (DIFLUCAN) 150 MG tablet    Sig: Take 1 tablet (150 mg total) by mouth daily.    Dispense:  1 tablet  Refill:  0   sulfamethoxazole-trimethoprim (BACTRIM DS) 800-160 MG tablet    Sig: Take 1 tablet by mouth 2 (two) times daily.    Dispense:  14 tablet    Refill:  0    Orders Placed This Encounter  Procedures   Urine Culture   CBC with Differential/Platelet   Comprehensive metabolic panel   Hemoglobin A1c   Lipid panel   Microalbumin / creatinine urine ratio   Rheumatoid factor   CYCLIC CITRUL PEPTIDE ANTIBODY, IGG/IGA   Sedimentation rate   C-reactive protein   ANA w/Reflex   POCT URINALYSIS DIP (CLINITEK)     Follow-up: No follow-ups on file.   I,Marla I Leal-Borjas,acting as a scribe for Blane Ohara, MD.,have documented all relevant documentation on the behalf of Blane Ohara, MD,as directed by  Blane Ohara, MD while in the presence of Blane Ohara, MD.   An After Visit Summary was printed and given to the patient.  Blane Ohara, MD [facility] [phone]

## 2023-05-20 ENCOUNTER — Ambulatory Visit (INDEPENDENT_AMBULATORY_CARE_PROVIDER_SITE_OTHER): Payer: Medicare Other | Admitting: Family Medicine

## 2023-05-20 ENCOUNTER — Encounter: Payer: Self-pay | Admitting: Family Medicine

## 2023-05-20 VITALS — BP 130/72 | HR 74 | Temp 97.1°F | Ht 67.0 in | Wt 221.0 lb

## 2023-05-20 DIAGNOSIS — M2550 Pain in unspecified joint: Secondary | ICD-10-CM | POA: Insufficient documentation

## 2023-05-20 DIAGNOSIS — I10 Essential (primary) hypertension: Secondary | ICD-10-CM

## 2023-05-20 DIAGNOSIS — N3000 Acute cystitis without hematuria: Secondary | ICD-10-CM | POA: Diagnosis not present

## 2023-05-20 DIAGNOSIS — M2559 Pain in other specified joint: Secondary | ICD-10-CM

## 2023-05-20 DIAGNOSIS — M542 Cervicalgia: Secondary | ICD-10-CM | POA: Diagnosis not present

## 2023-05-20 DIAGNOSIS — K219 Gastro-esophageal reflux disease without esophagitis: Secondary | ICD-10-CM

## 2023-05-20 DIAGNOSIS — E782 Mixed hyperlipidemia: Secondary | ICD-10-CM

## 2023-05-20 DIAGNOSIS — N952 Postmenopausal atrophic vaginitis: Secondary | ICD-10-CM

## 2023-05-20 DIAGNOSIS — E1142 Type 2 diabetes mellitus with diabetic polyneuropathy: Secondary | ICD-10-CM | POA: Diagnosis not present

## 2023-05-20 DIAGNOSIS — M6281 Muscle weakness (generalized): Secondary | ICD-10-CM | POA: Diagnosis not present

## 2023-05-20 HISTORY — DX: Pain in unspecified joint: M25.50

## 2023-05-20 HISTORY — DX: Essential (primary) hypertension: I10

## 2023-05-20 LAB — POCT URINALYSIS DIP (CLINITEK)
Bilirubin, UA: NEGATIVE
Blood, UA: NEGATIVE
Glucose, UA: NEGATIVE mg/dL
Ketones, POC UA: NEGATIVE mg/dL
Nitrite, UA: NEGATIVE
POC PROTEIN,UA: NEGATIVE
Spec Grav, UA: 1.015 (ref 1.010–1.025)
Urobilinogen, UA: 0.2 U/dL
pH, UA: 5.5 (ref 5.0–8.0)

## 2023-05-20 MED ORDER — FLUCONAZOLE 150 MG PO TABS
150.0000 mg | ORAL_TABLET | Freq: Every day | ORAL | 0 refills | Status: DC
Start: 2023-05-20 — End: 2023-08-18

## 2023-05-20 MED ORDER — HYDROCORTISONE ACETATE 25 MG RE SUPP: 25.0000 mg | Freq: Two times a day (BID) | RECTAL | 2 refills | Status: DC

## 2023-05-20 MED ORDER — SULFAMETHOXAZOLE-TRIMETHOPRIM 800-160 MG PO TABS: 1.0000 | ORAL_TABLET | Freq: Two times a day (BID) | ORAL | 0 refills | Status: DC

## 2023-05-20 MED ORDER — PREMARIN 0.625 MG/GM VA CREA: TOPICAL_CREAM | VAGINAL | 1 refills | Status: DC

## 2023-05-21 LAB — CBC WITH DIFFERENTIAL/PLATELET
Basophils Absolute: 0.1 10*3/uL (ref 0.0–0.2)
Basos: 1 %
EOS (ABSOLUTE): 0.1 10*3/uL (ref 0.0–0.4)
Eos: 2 %
Hematocrit: 35.6 % (ref 34.0–46.6)
Hemoglobin: 11.8 g/dL (ref 11.1–15.9)
Immature Grans (Abs): 0 10*3/uL (ref 0.0–0.1)
Immature Granulocytes: 0 %
Lymphocytes Absolute: 2.1 10*3/uL (ref 0.7–3.1)
Lymphs: 39 %
MCH: 33.2 pg — ABNORMAL HIGH (ref 26.6–33.0)
MCHC: 33.1 g/dL (ref 31.5–35.7)
MCV: 100 fL — ABNORMAL HIGH (ref 79–97)
Monocytes Absolute: 0.5 10*3/uL (ref 0.1–0.9)
Monocytes: 10 %
Neutrophils Absolute: 2.6 10*3/uL (ref 1.4–7.0)
Neutrophils: 48 %
Platelets: 230 10*3/uL (ref 150–450)
RBC: 3.55 x10E6/uL — ABNORMAL LOW (ref 3.77–5.28)
RDW: 12.7 % (ref 11.7–15.4)
WBC: 5.4 10*3/uL (ref 3.4–10.8)

## 2023-05-21 LAB — COMPREHENSIVE METABOLIC PANEL
ALT: 17 [IU]/L (ref 0–32)
AST: 27 [IU]/L (ref 0–40)
Albumin: 4.3 g/dL (ref 3.8–4.8)
Alkaline Phosphatase: 66 [IU]/L (ref 44–121)
BUN/Creatinine Ratio: 14 (ref 12–28)
BUN: 13 mg/dL (ref 8–27)
Bilirubin Total: 0.4 mg/dL (ref 0.0–1.2)
CO2: 29 mmol/L (ref 20–29)
Calcium: 9.7 mg/dL (ref 8.7–10.3)
Chloride: 98 mmol/L (ref 96–106)
Creatinine, Ser: 0.92 mg/dL (ref 0.57–1.00)
Globulin, Total: 2.8 g/dL (ref 1.5–4.5)
Glucose: 134 mg/dL — ABNORMAL HIGH (ref 70–99)
Potassium: 4 mmol/L (ref 3.5–5.2)
Sodium: 140 mmol/L (ref 134–144)
Total Protein: 7.1 g/dL (ref 6.0–8.5)
eGFR: 67 mL/min/{1.73_m2} (ref >59)

## 2023-05-21 LAB — LIPID PANEL
Chol/HDL Ratio: 3 {ratio} (ref 0.0–4.4)
Cholesterol, Total: 133 mg/dL (ref 100–199)
HDL: 44 mg/dL (ref >39)
LDL Chol Calc (NIH): 66 mg/dL (ref 0–99)
Triglycerides: 132 mg/dL (ref 0–149)
VLDL Cholesterol Cal: 23 mg/dL (ref 5–40)

## 2023-05-21 LAB — SEDIMENTATION RATE: Sed Rate: 7 mm/h (ref 0–40)

## 2023-05-21 LAB — MICROALBUMIN / CREATININE URINE RATIO
Creatinine, Urine: 34.5 mg/dL
Microalb/Creat Ratio: <9 mg/g{creat} (ref 0–29)
Microalbumin, Urine: <3 ug/mL

## 2023-05-21 LAB — CYCLIC CITRUL PEPTIDE ANTIBODY, IGG/IGA: Cyclic Citrullin Peptide Ab: 8 U (ref 0–19)

## 2023-05-21 LAB — HEMOGLOBIN A1C
Est. average glucose Bld gHb Est-mCnc: 151 mg/dL
Hgb A1c MFr Bld: 6.9 % — ABNORMAL HIGH (ref 4.8–5.6)

## 2023-05-21 LAB — ANA W/REFLEX: Anti Nuclear Antibody (ANA): NEGATIVE

## 2023-05-21 LAB — RHEUMATOID FACTOR: Rheumatoid fact SerPl-aCnc: <10 [IU]/mL (ref <14.0)

## 2023-05-21 LAB — C-REACTIVE PROTEIN: CRP: <1 mg/L (ref 0–10)

## 2023-05-21 NOTE — Assessment & Plan Note (Signed)
>>  ASSESSMENT AND PLAN FOR ESSENTIAL HYPERTENSION, BENIGN WRITTEN ON 05/21/2023 11:40 AM BY COX, KIRSTEN, MD  Well controlled.  On Chlorthalidone  and Valsartan . -Continue current management.

## 2023-05-21 NOTE — Assessment & Plan Note (Signed)
Improved.  Continue premarin cream. 

## 2023-05-21 NOTE — Assessment & Plan Note (Signed)
The current medical regimen is effective;  continue present plan and medications. On Pravastatin.

## 2023-05-21 NOTE — Assessment & Plan Note (Signed)
Well controlled.  On Chlorthalidone and Valsartan. -Continue current management.

## 2023-05-21 NOTE — Assessment & Plan Note (Signed)
Recurrent utis.  Start on bactrim ds.  Follow up for recheck UA in 2 weeks.  Consider prophylactic trimethoprim at that time.

## 2023-05-21 NOTE — Assessment & Plan Note (Signed)
The current medical regimen is effective;  continue present plan and medications.  Continue famotidine and omeprazole twice daily.   

## 2023-05-21 NOTE — Assessment & Plan Note (Signed)
Check labs. Rule out rheumatoid arthritis.

## 2023-05-21 NOTE — Assessment & Plan Note (Signed)
Fairly well controlled.  Last A1C was 7.1, on Actos due to intolerance to multiple other diabetes medications. -Continue current management and monitor blood glucose levels. Recommend check feet daily. Recommend annual eye exams. Continue to work on eating a healthy diet and exercise.  Labs drawn today.

## 2023-05-22 ENCOUNTER — Telehealth: Payer: Self-pay

## 2023-05-22 LAB — URINE CULTURE

## 2023-05-22 NOTE — Telephone Encounter (Signed)
Patient aware that she does only need one. Any further issues to call our office.

## 2023-05-22 NOTE — Telephone Encounter (Signed)
Copied from CRM [id number]. Topic: Clinical - Prescription Issue >> May 21, 2023  2:38 PM Megan Galvan wrote: Reason for CRM: fluconazole (DIFLUCAN) 150 MG tablet  patient needs clarification that she only needs one pill or if she needs more, please call patient [phone]

## 2023-05-23 ENCOUNTER — Other Ambulatory Visit: Payer: Self-pay | Admitting: Family Medicine

## 2023-05-23 ENCOUNTER — Ambulatory Visit: Payer: Medicare Other | Attending: Nurse Practitioner

## 2023-05-23 DIAGNOSIS — Z01810 Encounter for preprocedural cardiovascular examination: Secondary | ICD-10-CM | POA: Diagnosis not present

## 2023-05-23 NOTE — Progress Notes (Signed)
Virtual Visit via Telephone Note   Because of Megan Galvan's Galvan-morbid illnesses, she is at least at moderate risk for complications without adequate follow up.  This format is felt to be most appropriate for this patient at this time.  The patient did not have access to video technology/had technical difficulties with video requiring transitioning to audio format only (telephone).  All issues noted in this document were discussed and addressed.  No physical exam could be performed with this format.  Please refer to the patient's chart for her consent to telehealth for [facility].  Evaluation Performed:  Preoperative cardiovascular risk assessment _____________   Date:  05/23/2023   Patient ID:  Megan Galvan, DOB 1950-10-04, MRN [id number] Patient Location:  Home Provider location:   Office  Primary Care Provider:  Blane Ohara, MD Primary Cardiologist:  None  Chief Complaint / Patient Profile   73 y.o. y/o female with a h/o aortic insufficiency s/p AVR/2022, HLD, DM type II, sleep apnea, AF, HTN, HLD who is pending colonoscopy and presents today for telephonic preoperative cardiovascular risk assessment.  History of Present Illness    Megan Galvan is a 74 y.o. female who presents via audio/video conferencing for a telehealth visit today.  Pt was last seen in cardiology clinic on 11/18/2022 by Dr. Dulce Sellar.  At that time Megan Galvan was doing well with no new cardiac complaints and blood pressure was well-controlled.  The patient is now pending procedure as outlined above. Since her last visit, she has been doing well with no significant change from previous follow-up in July.  Her blood pressures have been stable at 134/73 and patient is tolerating her current medications without any adverse reactions.  She denies chest pain, shortness of breath, lower extremity edema, fatigue, palpitations, melena, hematuria, hemoptysis, diaphoresis, weakness, presyncope, syncope,  orthopnea, and PND.    Past Medical History    Past Medical History:  Diagnosis Date   Allergic sinusitis 08/27/2022   Arthritis    Atrophic vaginitis 03/31/2021   Cataract    Class 1 obesity due to excess calories with serious comorbidity and body mass index (BMI) of 33.0 to 33.9 in adult 03/28/2022   Encounter for osteoporosis screening in asymptomatic postmenopausal patient 06/28/2022   External hemorrhoids 11/12/2021   Gastro-esophageal reflux disease without esophagitis 06/03/2019   Heart murmur    AVR   History of kidney stones    Hypertension associated with diabetes (HCC) 10/01/2019   Mixed hyperlipidemia    Mucositis (ulcerative) of vagina and vulva 06/21/2022   Neck pain, chronic 10/18/2022   Primary insomnia 06/03/2019   S/P aortic valve replacement with bioprosthetic valve 08/10/2020   21 mm Edwards Resilia Inspiria Bioprosthetic Tissue Valve  SN [id number] Model 11500A   Sleep apnea    cpap   Type 2 diabetes mellitus with diabetic polyneuropathy, without long-term current use of insulin (HCC) 06/03/2019   Vertigo 07/20/2019   Past Surgical History:  Procedure Laterality Date   ANTERIOR CERVICAL DECOMP/DISCECTOMY FUSION     x 2   ANTERIOR CERVICAL DECOMP/DISCECTOMY FUSION N/A 02/19/2023   Procedure: Anterior Cervical Discectomy Fusion-Cervical Three-Cervical Four, Removal Plate Cervical Four-Cervical Five;  Surgeon: Arman Bogus, MD;  Location: [facility] OR;  Service: Neurosurgery;  Laterality: N/A;  3C   AORTIC VALVE REPLACEMENT N/A 08/10/2020   Procedure: AORTIC VALVE REPLACEMENT (AVR) USING INSPIRIS VALVE SIZE ;  Surgeon: Purcell Nails, MD;  Location: [facility] OR;  Service: Open Heart Surgery;  Laterality:  N/A;   BACK SURGERY  11/17/2016   L3-L5   BILATERAL CARPAL TUNNEL RELEASE     bone spur Left    left shoulder   bone spur Right    Right shoulder   CATARACT EXTRACTION Left 09/18/2021   COLONOSCOPY  08/22/2011   Moderate predominantly sigmoid  diverticulosis. Small internal hemorrhoids.    COLONOSCOPY     HEMORROIDECTOMY  02/2019   RIGHT/LEFT HEART CATH AND CORONARY ANGIOGRAPHY N/A 07/27/2020   Procedure: RIGHT/LEFT HEART CATH AND CORONARY ANGIOGRAPHY;  Surgeon: Tonny Bollman, MD;  Location: [facility] INVASIVE CV LAB;  Service: Cardiovascular;  Laterality: N/A;   TEE WITHOUT CARDIOVERSION N/A 08/10/2020   Procedure: TRANSESOPHAGEAL ECHOCARDIOGRAM (TEE);  Surgeon: Purcell Nails, MD;  Location: [facility] OR;  Service: Open Heart Surgery;  Laterality: N/A;   torn rotator cuff Right    Right shoulder   torn rotator cuff Left    left shoulder   TRIGGER FINGER RELEASE Left    thumb   TRIGGER FINGER RELEASE Right    thumb and middle finger   UPPER GASTROINTESTINAL ENDOSCOPY     WRIST SURGERY Left    torn ligament   WRIST SURGERY Right    cyst    Allergies  Allergies  Allergen Reactions   Ace Inhibitors Cough   Farxiga [Dapagliflozin]     Yeast infections    Jardiance [Empagliflozin]     Yeast infections   Keflex [Cephalexin] Itching   Latex     Burn and itch   Metformin And Related Diarrhea   Nexium [Esomeprazole Magnesium] Diarrhea   Protonix [Pantoprazole Sodium] Other (See Comments)    Constipation   Doxycycline Rash    Home Medications    Prior to Admission medications   Medication Sig Start Date End Date Taking? Authorizing Provider  ACCU-CHEK GUIDE test strip USE 1 STRIP WITH METER DAILY IN  THE AFTERNOON 12/03/22   Windell Moment, MD  Accu-Chek Softclix Lancets lancets CHECK BLOOD SUGAR DAILY 08/22/22   Cox, Fritzi Mandes, MD  acetaminophen (TYLENOL) 500 MG tablet Take 1,000 mg by mouth 2 (two) times daily.    [provider]  AMBULATORY NON FORMULARY MEDICATION Diltiazem 2% compounded with lidocaine 5% ointment applied to the rectum 3 times daily for 8 weeks. 04/10/23   Lynann Bologna, MD  aspirin EC 81 MG tablet Take 1 tablet (81 mg total) by mouth daily. Swallow whole. 09/14/20   Baldo Daub, MD  Blood  Glucose Monitoring Suppl (ACCU-CHEK GUIDE ME) w/Device KIT Use as directed 09/19/20   CoxFritzi Mandes, MD  Calcium Carb-Cholecalciferol (CALCIUM 600 + D PO) Take 2 tablets by mouth daily.    [provider]  cetirizine (ZYRTEC) 10 MG tablet Take 10 mg by mouth daily.    [provider]  chlorthalidone (HYGROTON) 50 MG tablet TAKE 1 TABLET BY MOUTH DAILY 02/17/23   Cox, Kirsten, MD  famotidine (PEPCID) 40 MG tablet TAKE 1 TABLET BY MOUTH AT  BEDTIME 03/04/23   Cox, Kirsten, MD  fluconazole (DIFLUCAN) 150 MG tablet Take 1 tablet (150 mg total) by mouth daily. 05/20/23   Cox, Fritzi Mandes, MD  fluticasone (FLONASE) 50 MCG/ACT nasal spray Place 1 spray into the nose daily as needed for allergies.    [provider]  hydrocortisone (ANUSOL-HC) 25 MG suppository Place 1 suppository (25 mg total) rectally 2 (two) times daily. 05/20/23   CoxFritzi Mandes, MD  Melatonin 5 MG CAPS Take 5 mg by mouth at bedtime.    [provider]  methocarbamol (ROBAXIN-750) 750 MG tablet Take 1 tablet (750 mg total) by mouth 4 (four) times daily. 02/20/23   Meyran, Tiana Loft, NP  Multiple Vitamin (MULTIVITAMIN WITH MINERALS) TABS tablet Take 1 tablet by mouth daily.    [provider]  Multiple Vitamins-Minerals (HAIR SKIN & NAILS) TABS Take 1 tablet by mouth daily.    [provider]  nystatin ointment (MYCOSTATIN) Apply 1 Application topically 2 (two) times daily as needed (yeast / itching). Mix with triamcinolone    [provider]  omeprazole (PRILOSEC) 40 MG capsule TAKE 1 CAPSULE BY MOUTH TWICE  DAILY BEFORE MEALS 03/09/23   Cox, Kirsten, MD  oxyCODONE-acetaminophen (PERCOCET/ROXICET) 5-325 MG tablet Take 1 tablet by mouth every 4 (four) hours as needed for severe pain (pain score 7-10). 02/20/23   Meyran, Tiana Loft, NP  pioglitazone (ACTOS) 15 MG tablet TAKE 1 TABLET(15 MG) BY MOUTH DAILY 02/22/23   Cox, Kirsten, MD  pioglitazone (ACTOS) 30 MG tablet TAKE 1  TABLET BY MOUTH DAILY 04/09/23   Cox, Kirsten, MD  potassium chloride (MICRO-K) 10 MEQ CR capsule Take 4 capsules (40 mEq total) by mouth 2 (two) times daily. 03/10/23   Cox, Fritzi Mandes, MD  pravastatin (PRAVACHOL) 40 MG tablet TAKE 1 TABLET BY MOUTH AT  BEDTIME 01/19/23   Cox, Fritzi Mandes, MD  PREMARIN vaginal cream APPLY TWICE A week. 05/20/23   Blane Ohara, MD  PRESCRIPTION MEDICATION Place 1 Application rectally daily as needed (Hemorrhoids). NIFEDIPINE 5% ointment    [provider]  sulfamethoxazole-trimethoprim (BACTRIM DS) 800-160 MG tablet Take 1 tablet by mouth 2 (two) times daily. 05/20/23   Cox, Fritzi Mandes, MD  traZODone (DESYREL) 100 MG tablet TAKE 1 TABLET BY MOUTH BEFORE  BEDTIME 03/10/23   Cox, Fritzi Mandes, MD  triamcinolone ointment (KENALOG) 0.1 % Apply 1 Application topically 2 (two) times daily as needed (yeast / itching). Mix with nystatin    [provider]  valsartan (DIOVAN) 320 MG tablet TAKE 1 TABLET BY MOUTH DAILY 02/17/23   Blane Ohara, MD    Physical Exam    Vital Signs:  Megan Galvan does not have vital signs available for review today.134/73  Given telephonic nature of communication, physical exam is limited. AAOx3. NAD. Normal affect.  Speech and respirations are unlabored.  Accessory Clinical Findings    None  Assessment & Plan    1.  Preoperative Cardiovascular Risk Assessment: -Patient's RCRI score is 0.9% The patient affirms she has been doing well without any new cardiac symptoms. They are able to achieve 6 METS without cardiac limitations. Therefore, based on ACC/AHA guidelines, the patient would be at acceptable risk for the planned procedure without further cardiovascular testing. The patient was advised that if she develops new symptoms prior to surgery to contact our office to arrange for a follow-up visit, and she verbalized understanding.   The patient was advised that if she develops new symptoms prior to surgery to contact our office to  arrange for a follow-up visit, and she verbalized understanding.  Per office protocol, aspirin may be held for 5-7 days prior to surgery.   A copy of this note will be routed to requesting surgeon.  Time:   Today, I have spent 8 minutes with the patient with telehealth technology discussing medical history, symptoms, and management plan.     Napoleon Form, Leodis Rains, NP  05/23/2023, 7:19 AM

## 2023-05-26 ENCOUNTER — Ambulatory Visit (AMBULATORY_SURGERY_CENTER): Payer: Medicare Other

## 2023-05-26 VITALS — Ht 67.0 in | Wt 222.0 lb

## 2023-05-26 DIAGNOSIS — K6289 Other specified diseases of anus and rectum: Secondary | ICD-10-CM

## 2023-05-26 DIAGNOSIS — K581 Irritable bowel syndrome with constipation: Secondary | ICD-10-CM

## 2023-05-26 DIAGNOSIS — Z860100 Personal history of colon polyps, unspecified: Secondary | ICD-10-CM

## 2023-05-26 NOTE — Progress Notes (Signed)
No egg or soy allergy known to patient  No issues known to pt with past sedation with any surgeries or procedures Patient denies ever being told they had issues or difficulty with intubation  No FH of Malignant Hyperthermia Pt is not on diet pills Pt is not on  home 02  OSA using cpap Pt is not on blood thinners. ASA 81 mg.  Pt reports issues constipation  No A fib or A flutter Have any cardiac testing pending-- no LOA: independent  Prep: spilt dose miralax   Patient's chart reviewed by Cathlyn Parsons CNRA prior to previsit and patient appropriate for the [facility].  Previsit completed and red dot placed by patient's name on their procedure day (on provider's schedule).     PV completed with patient. Prep instructions given at [facility] apt

## 2023-05-27 DIAGNOSIS — M542 Cervicalgia: Secondary | ICD-10-CM | POA: Diagnosis not present

## 2023-05-29 DIAGNOSIS — M542 Cervicalgia: Secondary | ICD-10-CM | POA: Diagnosis not present

## 2023-05-29 DIAGNOSIS — M6281 Muscle weakness (generalized): Secondary | ICD-10-CM | POA: Diagnosis not present

## 2023-05-29 NOTE — Telephone Encounter (Signed)
Cardiac clearance received per cardio OV note 05/23/23.

## 2023-06-03 ENCOUNTER — Ambulatory Visit (INDEPENDENT_AMBULATORY_CARE_PROVIDER_SITE_OTHER): Payer: Medicare Other

## 2023-06-03 DIAGNOSIS — N3000 Acute cystitis without hematuria: Secondary | ICD-10-CM | POA: Diagnosis not present

## 2023-06-03 LAB — POCT URINALYSIS DIP (CLINITEK)
Bilirubin, UA: NEGATIVE
Blood, UA: NEGATIVE
Glucose, UA: NEGATIVE mg/dL
Ketones, POC UA: NEGATIVE mg/dL
Leukocytes, UA: NEGATIVE
Nitrite, UA: NEGATIVE
POC PROTEIN,UA: NEGATIVE
Spec Grav, UA: 1.015 (ref 1.010–1.025)
Urobilinogen, UA: NEGATIVE U/dL — AB
pH, UA: 6.5 (ref 5.0–8.0)

## 2023-06-03 NOTE — Progress Notes (Signed)
Patient is no longer symptomatic and urine results were good. Patient only to follow up as needed.

## 2023-06-05 ENCOUNTER — Encounter: Payer: Self-pay | Admitting: Gastroenterology

## 2023-06-09 ENCOUNTER — Telehealth: Payer: Self-pay | Admitting: Gastroenterology

## 2023-06-09 DIAGNOSIS — M6281 Muscle weakness (generalized): Secondary | ICD-10-CM | POA: Diagnosis not present

## 2023-06-09 DIAGNOSIS — M542 Cervicalgia: Secondary | ICD-10-CM | POA: Diagnosis not present

## 2023-06-09 NOTE — Telephone Encounter (Signed)
 Patient has further questions on the Aspirin  before colonoscopy on 06/13/2023. Please advise

## 2023-06-09 NOTE — Telephone Encounter (Signed)
 Pt stated that she had questions in regard to medications. Pt discharge instructions reviewed with pt. Pt verbalized understanding with all questions answered.

## 2023-06-12 ENCOUNTER — Other Ambulatory Visit: Payer: Self-pay | Admitting: Family Medicine

## 2023-06-13 ENCOUNTER — Ambulatory Visit: Payer: Medicare Other | Admitting: Gastroenterology

## 2023-06-13 ENCOUNTER — Encounter: Payer: Self-pay | Admitting: Gastroenterology

## 2023-06-13 VITALS — BP 130/64 | HR 59 | Temp 97.5°F | Resp 12 | Ht 67.0 in | Wt 220.0 lb

## 2023-06-13 DIAGNOSIS — D123 Benign neoplasm of transverse colon: Secondary | ICD-10-CM

## 2023-06-13 DIAGNOSIS — Z860100 Personal history of colon polyps, unspecified: Secondary | ICD-10-CM

## 2023-06-13 DIAGNOSIS — K640 First degree hemorrhoids: Secondary | ICD-10-CM | POA: Diagnosis not present

## 2023-06-13 DIAGNOSIS — K6289 Other specified diseases of anus and rectum: Secondary | ICD-10-CM

## 2023-06-13 DIAGNOSIS — K5730 Diverticulosis of large intestine without perforation or abscess without bleeding: Secondary | ICD-10-CM

## 2023-06-13 DIAGNOSIS — K581 Irritable bowel syndrome with constipation: Secondary | ICD-10-CM

## 2023-06-13 DIAGNOSIS — G4730 Sleep apnea, unspecified: Secondary | ICD-10-CM | POA: Diagnosis not present

## 2023-06-13 DIAGNOSIS — Z1211 Encounter for screening for malignant neoplasm of colon: Secondary | ICD-10-CM | POA: Diagnosis not present

## 2023-06-13 MED ORDER — SODIUM CHLORIDE 0.9 % IV SOLN: 500.0000 mL | INTRAVENOUS | Status: DC

## 2023-06-13 NOTE — Patient Instructions (Signed)
Educational handout provided to patient related to Hemorrhoids, Polyps, and Diverticulosis  Resume previous diet  Continue present medications  Awaiting pathology results  NO repeat colonoscopy is needed due to age  YOU HAD AN ENDOSCOPIC PROCEDURE TODAY AT THE [facility]:   Refer to the procedure report that was given to you for any specific questions about what was found during the examination.  If the procedure report does not answer your questions, please call your gastroenterologist to clarify.  If you requested that your care partner not be given the details of your procedure findings, then the procedure report has been included in a sealed envelope for you to review at your convenience later.  YOU SHOULD EXPECT: Some feelings of bloating in the abdomen. Passage of more gas than usual.  Walking can help get rid of the air that was put into your GI tract during the procedure and reduce the bloating. If you had a lower endoscopy (such as a colonoscopy or flexible sigmoidoscopy) you may notice spotting of blood in your stool or on the toilet paper. If you underwent a bowel prep for your procedure, you may not have a normal bowel movement for a few days.  Please Note:  You might notice some irritation and congestion in your nose or some drainage.  This is from the oxygen used during your procedure.  There is no need for concern and it should clear up in a day or so.  SYMPTOMS TO REPORT IMMEDIATELY:  Following lower endoscopy (colonoscopy or flexible sigmoidoscopy):  Excessive amounts of blood in the stool  Significant tenderness or worsening of abdominal pains  Swelling of the abdomen that is new, acute  Fever of 100F or higher  For urgent or emergent issues, a gastroenterologist can be reached at any hour by calling [phone]. Do not use MyChart messaging for urgent concerns.    DIET:  We do recommend a small meal at first, but then you may proceed to your regular  diet.  Drink plenty of fluids but you should avoid alcoholic beverages for 24 hours.  ACTIVITY:  You should plan to take it easy for the rest of today and you should NOT DRIVE or use heavy machinery until tomorrow (because of the sedation medicines used during the test).    FOLLOW UP: Our staff will call the number listed on your records the next business day following your procedure.  We will call around 7:15- 8:00 am to check on you and address any questions or concerns that you may have regarding the information given to you following your procedure. If we do not reach you, we will leave a message.     If any biopsies were taken you will be contacted by phone or by letter within the next 1-3 weeks.  Please call us at [phone] if you have not heard about the biopsies in 3 weeks.    SIGNATURES/CONFIDENTIALITY: You and/or your care partner have signed paperwork which will be entered into your electronic medical record.  These signatures attest to the fact that that the information above on your After Visit Summary has been reviewed and is understood.  Full responsibility of the confidentiality of this discharge information lies with you and/or your care-partner.

## 2023-06-13 NOTE — Progress Notes (Signed)
Report to PACU, RN, vss, BBS= Clear.

## 2023-06-13 NOTE — Progress Notes (Signed)
IMPRESSION and PLAN:    #1. Rectal discomfort likely d/t int Hoids vs fissure  #2. IBS-C  #3. H/O polyps- colon 01/2018.    Plan:  Colon today  HPI:    Chief Complaint:   Megan Galvan is a 73 y.o. female  With AS s/p bioprosthetic AVR 07/2020 with post-op Afib DM2, HLD, HTN, OSA, obesity For follow-up visit  Back to having constipation.  This is under good control with Apple, activia, mirlax.  She took Linzess 145 mcg which resulted in diarrhea.  She had failed Amitiza in past.  Complaining of rectal discomfort and "hemorrhoidal problems" every time she has a bowel movement.  No melena or hematochezia.  She is s/p hemorrhoidectomy by Dr. 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  She was treated briefly with diltiazem cream.  Denies having any upper GI symptoms including nausea, vomiting, heartburn, regurgitation, odynophagia or dysphagia.  No abdominal pain.  From cardiology standpoint she follows up with Dr. Dulce Sellar.   Past GI procedures:  EGD 11/2019; -Minimal gastritis  Colonoscopy 01/2018 -Colonic polyp status post polypectomy -Predominantly left colonic diverticulosis. -Non-bleeding internal hemorrhoids. -Otherwise normal colonoscopy. -Next due next 01/2023  Hemorrhoidectomy 02/2019 by Dr. Logan Bores -Chronic anal fissure and hemorrhoids  CTA 07/2020 1. Vascular findings and measurements pertinent to potential TAVR procedure, as detailed above. 2. Severe thickening calcification of the aortic valve, compatible with reported clinical history of severe aortic stenosis. 3.  Aortic Atherosclerosis (ICD10-I70.0). 4. 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 5. Colonic diverticulosis without evidence of acute diverticulitis at this time  Past Medical History:  Diagnosis Date   Allergic sinusitis 08/27/2022   Arthritis    Atrophic vaginitis 03/31/2021   Cataract    Class 1 obesity due  to excess calories with serious comorbidity and body mass index (BMI) of 33.0 to 33.9 in adult 03/28/2022   Encounter for osteoporosis screening in asymptomatic postmenopausal patient 06/28/2022   External hemorrhoids 11/12/2021   Gastro-esophageal reflux disease without esophagitis 06/03/2019   Heart murmur    AVR   History of kidney stones    Hypertension associated with diabetes (HCC) 10/01/2019   Mixed hyperlipidemia    Mucositis (ulcerative) of vagina and vulva 06/21/2022   Neck pain, chronic 10/18/2022   Primary insomnia 06/03/2019   S/P aortic valve replacement with bioprosthetic valve 08/10/2020   21 mm Edwards Resilia Inspiria Bioprosthetic Tissue Valve  SN [id number] Model 11500A   Sleep apnea    cpap   Type 2 diabetes mellitus with diabetic polyneuropathy, without long-term current use of insulin (HCC) 06/03/2019   Vertigo 07/20/2019    Past Medical History:  Diagnosis Date   Arthritis    Bone spur of acromioclavicular joint, left    GERD (gastroesophageal reflux disease)    Hyperlipidemia    Hypertension    Plantar fasciitis     Current Outpatient Medications  Medication Sig Dispense Refill   Acetaminophen 500 MG coapsule 1 capsule as needed.     Azilsartan Medoxomil 40 MG TABS Take 2 tablets by mouth daily.     CALCIUM CITRATE-VITAMIN D PO Take 1 tablet by mouth daily.     cetirizine (ZYRTEC) 10 MG tablet Take 1 tablet by mouth daily.     chlorthalidone (HYGROTON) 25 MG tablet Take 1 tablet by mouth daily.     fluticasone (FLONASE) 50 MCG/ACT nasal spray Place 1 spray into the nose as needed.  lansoprazole (PREVACID) 30 MG capsule Take 1 capsule by mouth 2 (two) times daily.     Melatonin 5 MG TABS Take 1 tablet by mouth at bedtime.     metFORMIN (GLUCOPHAGE) 1000 MG tablet Take 2 tablets by mouth.     metoprolol succinate (TOPROL-XL) 50 MG 24 hr tablet Take 1 tablet by mouth daily.     Multiple Vitamin (MULTI-VITAMINS) TABS Take 1 tablet by mouth daily.      potassium chloride SA (K-DUR,KLOR-CON) 20 MEQ tablet Take 3 tablets by mouth daily.     pravastatin (PRAVACHOL) 40 MG tablet Take 1 tablet by mouth at bedtime.     PROCTOZONE-HC 2.5 % rectal cream APPLY A THIN LAYER TO THE AFFECTED AREA RECTALLY TWICE DAILY  0   ranitidine (ZANTAC) 300 MG tablet Take 1 tablet by mouth at bedtime.     traZODone (DESYREL) 100 MG tablet Take 1 tablet by mouth at bedtime.     No current facility-administered medications for this visit.     Past Surgical History:  Procedure Laterality Date   BACK SURGERY  11/17/2016   L3-L5   BILATERAL CARPAL TUNNEL RELEASE     bone spur Left    left shoulder   bone spur Right    Right shoulder   COLONOSCOPY  08/22/2011   Moderate predominantly sigmoid diverticulosis. Small internal hemorrhoids.    NECK SURGERY     NECK SURGERY     torn rotator cuff Right    Right shoulder   torn rotator cuff Left    left shoulder   TRIGGER FINGER RELEASE Left    thumb   TRIGGER FINGER RELEASE Right    thumb and middle finger   WRIST SURGERY Left    torn ligament   WRIST SURGERY Right    cyst    Family History  Problem Relation Age of Onset   Colon cancer Paternal Grandmother     Social History   Tobacco Use   Smoking status: Never Smoker   Smokeless tobacco: Never Used  Substance Use Topics   Alcohol use: Never    Frequency: Never   Drug use: Never    Allergies  Allergen Reactions   Doxycycline     Rash    Latex     Burn and itch     Review of Systems: All systems reviewed and negative except where noted in HPI.    Physical Exam:     BP 128/72   Pulse 75   Ht 5\' 6"  (1.676 m)   Wt 210 lb 4 oz (95.4 kg)   BMI 33.94 kg/m  GENERAL:  Alert, oriented, cooperative, not in acute distress. PSYCH: :Pleasant, normal mood and affect. ABDOMEN: Inspection: No visible peristalsis, no abnormal pulsations, skin normal.  Palpation/percussion: Soft, nontender, nondistended, no rigidity, no abnormal dullness to  percussion, no hepatosplenomegaly and no palpable abdominal masses.  Auscultation: Normal bowel sounds, no abdominal bruits. Rectal exam: In presence of Brooke, no obvious fissures or hemorrhoids.  Nontender, mild perianal excoriation.  Stool brown heme-negative.     Megan Galvan   CC Cox, Kirsten, MD

## 2023-06-13 NOTE — Progress Notes (Signed)
Called to room to assist during endoscopic procedure.  Patient ID and intended procedure confirmed with present staff. Received instructions for my participation in the procedure from the performing physician.

## 2023-06-13 NOTE — Op Note (Signed)
[facility] Patient Name: Megan Galvan Procedure Date: 06/13/2023 9:50 AM MRN: [id number] Endoscopist: Lynann Bologna , MD, [id number] Age: 73 Referring MD:  Date of Birth: 09-06-1950 Gender: Female Account #: [id number] Procedure:                Colonoscopy Indications:              High risk colon cancer surveillance: Personal                            history of colonic polyps. Rectal pain. Medicines:                Monitored Anesthesia Care Procedure:                Pre-Anesthesia Assessment:                           - Prior to the procedure, a History and Physical                            was performed, and patient medications and                            allergies were reviewed. The patient's tolerance of                            previous anesthesia was also reviewed. The risks                            and benefits of the procedure and the sedation                            options and risks were discussed with the patient.                            All questions were answered, and informed consent                            was obtained. Prior Anticoagulants: The patient has                            taken no anticoagulant or antiplatelet agents. ASA                            Grade Assessment: II - A patient with mild systemic                            disease. After reviewing the risks and benefits,                            the patient was deemed in satisfactory condition to                            undergo the procedure.  After obtaining informed consent, the colonoscope                            was passed under direct vision. Throughout the                            procedure, the patient's blood pressure, pulse, and                            oxygen saturations were monitored continuously. The                            Olympus Scope [id number] was introduced through the                            anus and advanced to  the 2 cm into the ileum. The                            colonoscopy was performed without difficulty. The                            patient tolerated the procedure well. The quality                            of the bowel preparation was good. The terminal                            ileum, ileocecal valve, appendiceal orifice, and                            rectum were photographed. Scope In: 9:55:01 AM Scope Out: 10:09:11 AM Scope Withdrawal Time: 0 hours 9 minutes 1 second  Total Procedure Duration: 0 hours 14 minutes 10 seconds  Findings:                 A 6 mm polyp was found in the mid transverse colon.                            The polyp was sessile. The polyp was removed with a                            cold snare. Resection and retrieval were complete.                           Multiple medium-mouthed diverticula were found in                            the sigmoid colon, few in descending colon and                            ascending colon.                           Non-bleeding internal hemorrhoids were found during  retroflexion. The hemorrhoids were small and Grade                            I (internal hemorrhoids that do not prolapse).                            There was evidence of previous hemorrhoidectomy. No                            obvious anal fissures.                           The terminal ileum appeared normal.                           The exam was otherwise without abnormality on                            direct and retroflexion views. Complications:            No immediate complications. Estimated Blood Loss:     Estimated blood loss: none. Impression:               - One 6 mm polyp in the mid transverse colon,                            removed with a cold snare. Resected and retrieved.                           - Moderate predominantly sigmoid diverticulosis                           - Non-bleeding internal hemorrhoids.                            - The examined portion of the ileum was normal.                           - The examination was otherwise normal on direct                            and retroflexion views. Recommendation:           - Patient has a contact number available for                            emergencies. The signs and symptoms of potential                            delayed complications were discussed with the                            patient. Return to normal activities tomorrow.                            Written discharge instructions were provided to the  patient.                           - Resume previous diet. Continue Metamucil.                           - Miralax 1 capful (17 grams) in 8 ounces of water                            PO daily.                           - Continue diltiazem as needed for rectal pain.                           - If continued rectal pain, consider referral to                            Dr. Logan Bores for possible EUA                           - Repeat colonoscopy is not recommended for                            surveillance d/t age.                           - The findings and recommendations were discussed                            with the patient's family. Lynann Bologna, MD 06/13/2023 10:14:29 AM This report has been signed electronically.

## 2023-06-13 NOTE — Progress Notes (Signed)
Pt's states no medical or surgical changes since previsit or office visit.

## 2023-06-16 ENCOUNTER — Telehealth: Payer: Self-pay | Admitting: *Deleted

## 2023-06-16 DIAGNOSIS — M6281 Muscle weakness (generalized): Secondary | ICD-10-CM | POA: Diagnosis not present

## 2023-06-16 DIAGNOSIS — M542 Cervicalgia: Secondary | ICD-10-CM | POA: Diagnosis not present

## 2023-06-16 NOTE — Telephone Encounter (Signed)
 No answer on  follow up call. Left message.

## 2023-06-17 LAB — SURGICAL PATHOLOGY

## 2023-06-19 ENCOUNTER — Encounter: Payer: Self-pay | Admitting: Gastroenterology

## 2023-06-19 ENCOUNTER — Telehealth: Payer: Self-pay | Admitting: Gastroenterology

## 2023-06-19 NOTE — Telephone Encounter (Signed)
Patient called and stated that she had a procedure done with Dr. Chales Abrahams and that Dr. Chales Abrahams stated he would prescribe her a cream to apply. Patient is requesting a call back. Please advise.

## 2023-06-19 NOTE — Telephone Encounter (Signed)
Patient was told at time of colonoscopy to continue diltiazem rectal cream PRN, however she cannot tolerate the cream stated it causes burning. She'd like to know if there is an alternative that Dr. Chales Abrahams can send in.

## 2023-06-20 ENCOUNTER — Other Ambulatory Visit: Payer: Self-pay

## 2023-06-20 MED ORDER — AMBULATORY NON FORMULARY MEDICATION: 2 refills | Status: AC

## 2023-06-20 NOTE — Telephone Encounter (Signed)
Left message for patient to call back. Fax with prescription sent to [facility] [phone].

## 2023-06-20 NOTE — Telephone Encounter (Signed)
Received call from [facility] that they do not carry this strength of medication. Called & confirmed with [facility] that they do carry ointment. Will confirm with patient if she will be able to pick up prescription from pharmacy prior to sending in order.

## 2023-06-20 NOTE — Telephone Encounter (Signed)
Nitroglycerine ointment 0.125 %  Apply a pea sized amount internally four times daily. Dispense 30g, 2 refill.  RG

## 2023-06-20 NOTE — Telephone Encounter (Signed)
Attempted to call patient again with no answer. Left order form on Viviann Spare, RN's desk to fax on Monday once he has confirmed with patient if okay to send to [facility].

## 2023-06-23 DIAGNOSIS — M6281 Muscle weakness (generalized): Secondary | ICD-10-CM | POA: Diagnosis not present

## 2023-06-23 DIAGNOSIS — M542 Cervicalgia: Secondary | ICD-10-CM | POA: Diagnosis not present

## 2023-06-23 NOTE — Telephone Encounter (Signed)
 Patient returning phone call. Requesting call back. Please advise, thank you.

## 2023-06-23 NOTE — Telephone Encounter (Signed)
 Pt made aware of the pharmacy change. Name of Pharmacy, address telephone number provided to pt. Prescription faxed [facility]. Pt made aware.  Pt verbalized understanding with all questions answered.

## 2023-06-23 NOTE — Telephone Encounter (Signed)
 Left message for pt to call back

## 2023-06-30 DIAGNOSIS — M6281 Muscle weakness (generalized): Secondary | ICD-10-CM | POA: Diagnosis not present

## 2023-06-30 DIAGNOSIS — M542 Cervicalgia: Secondary | ICD-10-CM | POA: Diagnosis not present

## 2023-06-30 NOTE — Progress Notes (Signed)
 Acute Office Visit  Subjective:    Patient ID: Megan Galvan, female    DOB: [birth year], 73 y.o.   MRN: [id number]  Chief Complaint  Patient presents with   Urinary Tract Infection   Discussed the use of AI scribe software for clinical note transcription with the patient, who gave verbal consent to proceed.  History of Present Illness   The patient, with a history of diverticulosis, non-bleeding internal hemorrhoids, and a previously removed polyp, presents with ongoing rectal pain. The pain is described as a burning and stinging sensation that occurs with each bowel movement, and is particularly severe with the first bowel movement of the day. The pain is so severe that it is uncomfortable for the patient to sit. The patient has been using Metamucil, which results in soft stools, but this has not alleviated the pain. The patient has also been prescribed diltiazem gel for the rectal pain, but this has not provided relief. The patient has been seen by a gastroenterologist, who suggested the possibility of rectal surgery, but the patient is hesitant due to the risk of needing a colostomy bag. The patient also reports a new symptom of itching and burning in the vaginal area, which she has been managing with a powder prescribed by her gynecologist for previous yeast infections.      Patient is in today stating she had a colonoscopy 2 weeks ago, since then when she has a BM she experiences burning and itching for the past 2-3 days. States she feels a little bloated. She has had problems since her previous colonoscopy.   Past Medical History:  Diagnosis Date   Allergic sinusitis 08/27/2022   Arthritis    Atrophic vaginitis 03/31/2021   Cataract    Class 1 obesity due to excess calories with serious comorbidity and body mass index (BMI) of 33.0 to 33.9 in adult 03/28/2022   Encounter for osteoporosis screening in asymptomatic postmenopausal patient 06/28/2022   External hemorrhoids 11/12/2021    Gastro-esophageal reflux disease without esophagitis 06/03/2019   Heart murmur    AVR   History of kidney stones    Hypertension associated with diabetes (HCC) 10/01/2019   Mixed hyperlipidemia    Mucositis (ulcerative) of vagina and vulva 06/21/2022   Neck pain, chronic 10/18/2022   Primary insomnia 06/03/2019   S/P aortic valve replacement with bioprosthetic valve 08/10/2020   21 mm Edwards Resilia Inspiria Bioprosthetic Tissue Valve  SN [id number] Model [id number]   Sleep apnea    cpap   Type 2 diabetes mellitus with diabetic polyneuropathy, without long-term current use of insulin (HCC) 06/03/2019   Vertigo 07/20/2019    Past Surgical History:  Procedure Laterality Date   ANTERIOR CERVICAL DECOMP/DISCECTOMY FUSION     x 2   ANTERIOR CERVICAL DECOMP/DISCECTOMY FUSION N/A 02/19/2023   Procedure: Anterior Cervical Discectomy Fusion-Cervical Three-Cervical Four, Removal Plate Cervical Four-Cervical Five;  Surgeon: Arman Bogus, MD;  Location: [facility] OR;  Service: Neurosurgery;  Laterality: N/A;  3C   AORTIC VALVE REPLACEMENT N/A 08/10/2020   Procedure: AORTIC VALVE REPLACEMENT (AVR) USING INSPIRIS VALVE SIZE ;  Surgeon: Purcell Nails, MD;  Location[facility] OR;  Service: Open Heart Surgery;  Laterality: N/A;   BACK SURGERY  11/17/2016   L3-L5   BILATERAL CARPAL TUNNEL RELEASE     bone spur Left    left shoulder   bone spur Right    Right shoulder   CATARACT EXTRACTION Left 09/18/2021   COLONOSCOPY  08/22/2011   Moderate  predominantly sigmoid diverticulosis. Small internal hemorrhoids.    COLONOSCOPY     HEMORROIDECTOMY  02/2019   RIGHT/LEFT HEART CATH AND CORONARY ANGIOGRAPHY N/A 07/27/2020   Procedure: RIGHT/LEFT HEART CATH AND CORONARY ANGIOGRAPHY;  Surgeon: Tonny Bollman, MD;  Location: [facility] INVASIVE CV LAB;  Service: Cardiovascular;  Laterality: N/A;   TEE WITHOUT CARDIOVERSION N/A 08/10/2020   Procedure: TRANSESOPHAGEAL ECHOCARDIOGRAM (TEE);  Surgeon: Purcell Nails,  MD;  Location: [facility] OR;  Service: Open Heart Surgery;  Laterality: N/A;   torn rotator cuff Right    Right shoulder   torn rotator cuff Left    left shoulder   TRIGGER FINGER RELEASE Left    thumb   TRIGGER FINGER RELEASE Right    thumb and middle finger   UPPER GASTROINTESTINAL ENDOSCOPY     WRIST SURGERY Left    torn ligament   WRIST SURGERY Right    cyst    Family History  Problem Relation Age of Onset   Diabetes Mother    Heart Problems Mother    Colon cancer Brother 103   Heart Problems Maternal Grandfather    Colon cancer Paternal Grandmother    Esophageal cancer Neg Hx    Rectal cancer Neg Hx    Stomach cancer Neg Hx    Breast cancer Neg Hx     Social History   Socioeconomic History   Marital status: Married    Spouse name: Eddie   Number of children: 2   Years of education: Not on file   Highest education level: Not on file  Occupational History   Not on file  Tobacco Use   Smoking status: Never   Smokeless tobacco: Never  Vaping Use   Vaping status: Never Used  Substance and Sexual Activity   Alcohol use: Never   Drug use: Never   Sexual activity: Yes    Birth control/protection: Post-menopausal  Other Topics Concern   Not on file  Social History Narrative   One son lives in home, the other son lives about 3 miles away   Social Drivers of Health   Financial Resource Strain: Low Risk  (09/18/2022)   Overall Financial Resource Strain (CARDIA)    Difficulty of Paying Living Expenses: Not very hard  Food Insecurity: No Food Insecurity (09/18/2022)   Hunger Vital Sign    Worried About Running Out of Food in the Last Year: Never true    Ran Out of Food in the Last Year: Never true  Transportation Needs: No Transportation Needs (09/18/2022)   PRAPARE - Administrator, Civil Service (Medical): No    Lack of Transportation (Non-Medical): No  Physical Activity: Inactive (09/18/2022)   Exercise Vital Sign    Days of Exercise per Week: 0 days     Minutes of Exercise per Session: 0 min  Stress: No Stress Concern Present (09/18/2022)   [employer] of Occupational Health - Occupational Stress Questionnaire    Feeling of Stress : Not at all  Social Connections: Moderately Integrated (09/18/2022)   Social Connection and Isolation Panel [NHANES]    Frequency of Communication with Friends and Family: More than three times a week    Frequency of Social Gatherings with Friends and Family: More than three times a week    Attends Religious Services: More than 4 times per year    Active Member of [employer] or Organizations: No    Attends Banker Meetings: Never    Marital Status: Married  Catering manager Violence:  Not At Risk (09/18/2022)   Humiliation, Afraid, Rape, and Kick questionnaire    Fear of Current or Ex-Partner: No    Emotionally Abused: No    Physically Abused: No    Sexually Abused: No    Outpatient Medications Prior to Visit  Medication Sig Dispense Refill   ACCU-CHEK GUIDE test strip USE 1 STRIP WITH METER DAILY IN  THE AFTERNOON 100 strip 2   Accu-Chek Softclix Lancets lancets CHECK BLOOD SUGAR DAILY 100 each 0   acetaminophen (TYLENOL) 500 MG tablet Take 1,000 mg by mouth 2 (two) times daily.     AMBULATORY NON FORMULARY MEDICATION Diltiazem 2% compounded with lidocaine 5% ointment applied to the rectum 3 times daily for 8 weeks. 1 each 0   AMBULATORY NON FORMULARY MEDICATION Nitroglycerine ointment 0.125 %. Apply a pea sized amount internally four times daily. 30 g 2   aspirin EC 81 MG tablet Take 1 tablet (81 mg total) by mouth daily. Swallow whole. 90 tablet 3   Blood Glucose Monitoring Suppl (ACCU-CHEK GUIDE ME) w/Device KIT Use as directed 1 kit 0   Calcium Carb-Cholecalciferol (CALCIUM 600 + D PO) Take 2 tablets by mouth daily.     cetirizine (ZYRTEC) 10 MG tablet Take 10 mg by mouth daily.     chlorthalidone (HYGROTON) 50 MG tablet TAKE 1 TABLET BY MOUTH DAILY 100 tablet 2   famotidine (PEPCID) 40 MG  tablet TAKE 1 TABLET BY MOUTH AT  BEDTIME 100 tablet 2   fluconazole (DIFLUCAN) 150 MG tablet Take 1 tablet (150 mg total) by mouth daily. (Patient not taking: Reported on 06/13/2023) 1 tablet 0   fluticasone (FLONASE) 50 MCG/ACT nasal spray Place 1 spray into the nose daily as needed for allergies.     hydrocortisone (ANUSOL-HC) 25 MG suppository Place 1 suppository (25 mg total) rectally 2 (two) times daily. 30 suppository 2   Melatonin 5 MG CAPS Take 5 mg by mouth at bedtime.     methocarbamol (ROBAXIN-750) 750 MG tablet Take 1 tablet (750 mg total) by mouth 4 (four) times daily. (Patient not taking: Reported on 06/13/2023) 45 tablet 0   Multiple Vitamin (MULTIVITAMIN WITH MINERALS) TABS tablet Take 1 tablet by mouth daily.     Multiple Vitamins-Minerals (HAIR SKIN & NAILS) TABS Take 1 tablet by mouth daily.     nystatin ointment (MYCOSTATIN) Apply 1 Application topically 2 (two) times daily as needed (yeast / itching). Mix with triamcinolone     omeprazole (PRILOSEC) 40 MG capsule TAKE 1 CAPSULE BY MOUTH TWICE  DAILY BEFORE MEALS 200 capsule 2   oxyCODONE-acetaminophen (PERCOCET/ROXICET) 5-325 MG tablet Take 1 tablet by mouth every 4 (four) hours as needed for severe pain (pain score 7-10). (Patient not taking: Reported on 06/13/2023) 30 tablet 0   pioglitazone (ACTOS) 30 MG tablet TAKE 1 TABLET BY MOUTH DAILY 90 tablet 2   potassium chloride (MICRO-K) 10 MEQ CR capsule Take 4 capsules (40 mEq total) by mouth 2 (two) times daily. 240 capsule 0   pravastatin (PRAVACHOL) 40 MG tablet TAKE 1 TABLET BY MOUTH AT  BEDTIME 100 tablet 2   PREMARIN vaginal cream APPLY TWICE A week. 42.5 g 1   PRESCRIPTION MEDICATION Place 1 Application rectally daily as needed (Hemorrhoids). NIFEDIPINE 5% ointment     traZODone (DESYREL) 100 MG tablet TAKE 1 TABLET BY MOUTH BEFORE  BEDTIME 90 tablet 3   triamcinolone ointment (KENALOG) 0.1 % Apply 1 Application topically 2 (two) times daily as needed (yeast / itching).  Mix  with nystatin     valsartan (DIOVAN) 320 MG tablet TAKE 1 TABLET BY MOUTH DAILY 100 tablet 2   No facility-administered medications prior to visit.    Allergies  Allergen Reactions   Ace Inhibitors Cough   Farxiga [Dapagliflozin] Other (See Comments)    Yeast infections    Jardiance [Empagliflozin] Other (See Comments)    Yeast infections   Keflex [Cephalexin] Itching   Latex Other (See Comments)    Burn and itch   Doxycycline Rash   Metformin And Related Diarrhea   Nexium [Esomeprazole Magnesium] Diarrhea   Protonix [Pantoprazole Sodium] Other (See Comments)    Constipation    Review of Systems  Constitutional:  Negative for chills, fatigue and fever.  Respiratory:  Negative for cough and shortness of breath.   Cardiovascular:  Negative for chest pain.  Gastrointestinal:  Positive for abdominal pain. Negative for constipation, diarrhea, nausea, rectal pain and vomiting.  Endocrine: Positive for polyuria (yesterday).  Genitourinary:  Positive for dysuria (Mild).       Objective:        07/01/2023   10:30 AM 06/13/2023   10:31 AM 06/13/2023   10:25 AM  Vitals with BMI  Height 5\' 7"     Weight 225 lbs    BMI 35.23    Systolic 132 130 119  Diastolic 68 64 53  Pulse 87 59 59    No data found.   Physical Exam Vitals reviewed.  Constitutional:      Appearance: Normal appearance.  Genitourinary:   Neurological:     Mental Status: She is alert.     Health Maintenance Due  Topic Date Due   Hepatitis C Screening  Never done   Zoster Vaccines- Shingrix (1 of 2) 05/28/1970   COVID-19 Vaccine (6 - 2024-25 season) 03/29/2023    There are no preventive care reminders to display for this patient.   Lab Results  Component Value Date   TSH 2.100 02/04/2023   Lab Results  Component Value Date   WBC 5.4 05/20/2023   HGB 11.8 05/20/2023   HCT 35.6 05/20/2023   MCV 100 (H) 05/20/2023   PLT 230 05/20/2023   Lab Results  Component Value Date   NA 140  05/20/2023   K 4.0 05/20/2023   CO2 29 05/20/2023   GLUCOSE 134 (H) 05/20/2023   BUN 13 05/20/2023   CREATININE 0.92 05/20/2023   BILITOT 0.4 05/20/2023   ALKPHOS 66 05/20/2023   AST 27 05/20/2023   ALT 17 05/20/2023   PROT 7.1 05/20/2023   ALBUMIN 4.3 05/20/2023   CALCIUM 9.7 05/20/2023   ANIONGAP 10 08/13/2020   EGFR 67 05/20/2023   Lab Results  Component Value Date   CHOL 133 05/20/2023   Lab Results  Component Value Date   HDL 44 05/20/2023   Lab Results  Component Value Date   LDLCALC 66 05/20/2023   Lab Results  Component Value Date   TRIG 132 05/20/2023   Lab Results  Component Value Date   CHOLHDL 3.0 05/20/2023   Lab Results  Component Value Date   HGBA1C 6.9 (H) 05/20/2023       Assessment & Plan:  Rectal pain Assessment & Plan: Assessment and Plan    Persistent rectal pain with bowel movements, despite recent colonoscopy showing non-bleeding internal hemorrhoids and diverticulosis. Previous use of nitroglycerin ointment provided minimal relief. -Prescribe Lotrisone cream for potential antifungal and anti-inflammatory effects. -Advise use of Vaseline for lubrication prior to bowel movements. -  Consider referral to Dr. Logan Bores if symptoms persist.     Orders: -     Clotrimazole-Betamethasone; Apply 1 Application topically daily.  Dispense: 45 g; Refill: 3     Meds ordered this encounter  Medications   clotrimazole-betamethasone (LOTRISONE) cream    Sig: Apply 1 Application topically daily.    Dispense:  45 g    Refill:  3    No orders of the defined types were placed in this encounter.    Follow-up: Return if symptoms worsen or fail to improve.  An After Visit Summary was printed and given to the patient.  Blane Ohara, MD [facility] [phone]

## 2023-07-01 ENCOUNTER — Encounter: Payer: Self-pay | Admitting: Family Medicine

## 2023-07-01 ENCOUNTER — Ambulatory Visit (INDEPENDENT_AMBULATORY_CARE_PROVIDER_SITE_OTHER): Admitting: Family Medicine

## 2023-07-01 VITALS — BP 132/68 | HR 87 | Temp 97.8°F | Ht 67.0 in | Wt 225.0 lb

## 2023-07-01 DIAGNOSIS — K6289 Other specified diseases of anus and rectum: Secondary | ICD-10-CM

## 2023-07-01 MED ORDER — CLOTRIMAZOLE-BETAMETHASONE 1-0.05 % EX CREA: 1.0000 | TOPICAL_CREAM | Freq: Every day | CUTANEOUS | 3 refills | Status: DC

## 2023-07-03 DIAGNOSIS — M5023 Other cervical disc displacement, cervicothoracic region: Secondary | ICD-10-CM | POA: Diagnosis not present

## 2023-07-03 DIAGNOSIS — M4802 Spinal stenosis, cervical region: Secondary | ICD-10-CM | POA: Diagnosis not present

## 2023-07-03 DIAGNOSIS — M542 Cervicalgia: Secondary | ICD-10-CM | POA: Diagnosis not present

## 2023-07-03 DIAGNOSIS — M47812 Spondylosis without myelopathy or radiculopathy, cervical region: Secondary | ICD-10-CM | POA: Diagnosis not present

## 2023-07-03 DIAGNOSIS — M4803 Spinal stenosis, cervicothoracic region: Secondary | ICD-10-CM | POA: Diagnosis not present

## 2023-07-06 NOTE — Assessment & Plan Note (Signed)
 Assessment and Plan    Persistent rectal pain with bowel movements, despite recent colonoscopy showing non-bleeding internal hemorrhoids and diverticulosis. Previous use of nitroglycerin ointment provided minimal relief. -Prescribe Lotrisone cream for potential antifungal and anti-inflammatory effects. -Advise use of Vaseline for lubrication prior to bowel movements. -Consider referral to Dr. Logan Bores if symptoms persist.

## 2023-07-10 DIAGNOSIS — M6281 Muscle weakness (generalized): Secondary | ICD-10-CM | POA: Diagnosis not present

## 2023-07-10 DIAGNOSIS — M542 Cervicalgia: Secondary | ICD-10-CM | POA: Diagnosis not present

## 2023-07-15 ENCOUNTER — Other Ambulatory Visit: Payer: Self-pay

## 2023-07-15 DIAGNOSIS — R42 Dizziness and giddiness: Secondary | ICD-10-CM

## 2023-07-17 DIAGNOSIS — M542 Cervicalgia: Secondary | ICD-10-CM | POA: Diagnosis not present

## 2023-07-17 DIAGNOSIS — M6281 Muscle weakness (generalized): Secondary | ICD-10-CM | POA: Diagnosis not present

## 2023-07-21 ENCOUNTER — Telehealth: Payer: Self-pay

## 2023-07-21 DIAGNOSIS — M542 Cervicalgia: Secondary | ICD-10-CM | POA: Diagnosis not present

## 2023-07-21 DIAGNOSIS — M6281 Muscle weakness (generalized): Secondary | ICD-10-CM | POA: Diagnosis not present

## 2023-07-21 NOTE — Telephone Encounter (Signed)
 Copied from CRM [id number]. Topic: Clinical - Prescription Issue >> Jul 21, 2023  1:02 PM Carlatta H wrote: Reason for CRM: Patient needs all Cpap supply prescription to be sent to [facility]//Please call the patient with an update

## 2023-07-25 ENCOUNTER — Other Ambulatory Visit: Payer: Self-pay

## 2023-07-25 DIAGNOSIS — G4700 Insomnia, unspecified: Secondary | ICD-10-CM

## 2023-07-25 NOTE — Telephone Encounter (Signed)
 Patient informed and agreed to at home sleep study.

## 2023-07-28 DIAGNOSIS — M6281 Muscle weakness (generalized): Secondary | ICD-10-CM | POA: Diagnosis not present

## 2023-07-28 DIAGNOSIS — M542 Cervicalgia: Secondary | ICD-10-CM | POA: Diagnosis not present

## 2023-07-31 DIAGNOSIS — G4733 Obstructive sleep apnea (adult) (pediatric): Secondary | ICD-10-CM | POA: Diagnosis not present

## 2023-07-31 DIAGNOSIS — R0602 Shortness of breath: Secondary | ICD-10-CM | POA: Diagnosis not present

## 2023-08-01 ENCOUNTER — Other Ambulatory Visit: Payer: Self-pay | Admitting: Family Medicine

## 2023-08-01 DIAGNOSIS — M542 Cervicalgia: Secondary | ICD-10-CM | POA: Diagnosis not present

## 2023-08-03 DIAGNOSIS — G4733 Obstructive sleep apnea (adult) (pediatric): Secondary | ICD-10-CM | POA: Diagnosis not present

## 2023-08-03 DIAGNOSIS — R0602 Shortness of breath: Secondary | ICD-10-CM | POA: Diagnosis not present

## 2023-08-08 ENCOUNTER — Other Ambulatory Visit: Payer: Self-pay

## 2023-08-08 ENCOUNTER — Telehealth: Payer: Self-pay

## 2023-08-08 NOTE — Telephone Encounter (Signed)
 Order for CPAP and supplies sent to [facility].

## 2023-08-17 NOTE — Progress Notes (Unsigned)
 Subjective:  Patient ID: Megan Galvan, female    DOB: June 16, 1950  Age: 73 y.o. MRN: [id number]  Chief Complaint  Patient presents with   Medical Management of Chronic Issues   HPI: Diabetes: She presents for her follow-up diabetic visit. She has type 2 diabetes mellitus.  Complications: diabetic polyneuropathy. Glucose checking:  daily  Glucose logs: Blood sugar range 130-140s  Hypoglycemia:none Most recent A1C: 6.9% Current medications: Actos  15 mg daily.   Last Eye Exam: 10/2022.   Foot checks: daily. Diet: trying. Eating more fruit. Avoiding carbs/sweets.  Exercise: none.   Hyperlipidemia: Current medications:  Pravastatin  40 mg daily    Hypertension: Current medications:  Chlorthalidone  50 mg daily, Valsartan  320 mg daily   GERD: well controlled on famotidine  and omeprazole  once daily.    OSA: uses cpap. Compliant and she feels like it helps.       05/20/2023    9:55 AM 10/16/2022    9:58 AM 09/18/2022   11:03 AM 08/27/2022    3:28 PM 06/28/2022   10:15 AM  Depression screen PHQ 2/9  Decreased Interest 1 0 0 0 0  Down, Depressed, Hopeless 1 0 0 0 0  PHQ - 2 Score 2 0 0 0 0  Altered sleeping 1 0     Tired, decreased energy 1 1     Change in appetite 0 0     Feeling bad or failure about yourself  0 0     Trouble concentrating 0 0     Moving slowly or fidgety/restless 0 0     Suicidal thoughts 0 0     PHQ-9 Score 4 1     Difficult doing work/chores Somewhat difficult Not difficult at all           05/20/2023    9:55 AM  Fall Risk   Falls in the past year? 0  Number falls in past yr: 0  Injury with Fall? 0  Risk for fall due to : No Fall Risks  Follow up Falls evaluation completed    Patient Care Team: Mercy Stall, MD as PCP - General (Family Medicine) Devon Fogo, [facility] (Inactive) as Pharmacist (Pharmacist) Lajuan Pila, MD as Consulting Physician (Gastroenterology) Hassan Links, MD as Consulting Physician (Cardiology) Barbera Books, OD  (Ophthalmology)   Review of Systems  Constitutional:  Negative for chills, fatigue and fever.  HENT:  Negative for congestion, ear pain, rhinorrhea and sore throat.   Respiratory:  Negative for cough and shortness of breath.   Cardiovascular:  Negative for chest pain.  Gastrointestinal:  Negative for abdominal pain, constipation, diarrhea, nausea and vomiting.  Genitourinary:  Positive for dysuria. Negative for urgency.  Musculoskeletal:  Negative for back pain and myalgias.  Neurological:  Negative for dizziness, weakness, light-headedness and headaches.  Psychiatric/Behavioral:  Negative for dysphoric mood. The patient is not nervous/anxious.     Current Outpatient Medications on File Prior to Visit  Medication Sig Dispense Refill   Multiple Vitamins-Minerals (HAIR SKIN & NAILS) TABS Take 1 tablet by mouth daily.     ACCU-CHEK GUIDE TEST test strip USE 1 STRIP WITH METER DAILY IN  THE AFTERNOON 100 strip 2   Accu-Chek Softclix Lancets lancets CHECK BLOOD SUGAR DAILY 100 each 2   acetaminophen  (TYLENOL ) 500 MG tablet Take 1,000 mg by mouth 2 (two) times daily.     AMBULATORY NON FORMULARY MEDICATION Diltiazem  2% compounded with lidocaine  5% ointment applied to the rectum 3 times daily for 8  weeks. 1 each 0   AMBULATORY NON FORMULARY MEDICATION Nitroglycerine ointment 0.125 %. Apply a pea sized amount internally four times daily. 30 g 2   aspirin  EC 81 MG tablet Take 1 tablet (81 mg total) by mouth daily. Swallow whole. 90 tablet 3   Blood Glucose Monitoring Suppl (ACCU-CHEK GUIDE ME) w/Device KIT Use as directed 1 kit 0   Calcium Carb-Cholecalciferol (CALCIUM 600 + D PO) Take 2 tablets by mouth daily.     cetirizine (ZYRTEC) 10 MG tablet Take 10 mg by mouth daily.     chlorthalidone  (HYGROTON ) 50 MG tablet TAKE 1 TABLET BY MOUTH DAILY 100 tablet 2   clotrimazole -betamethasone  (LOTRISONE ) cream Apply 1 Application topically daily. 45 g 3   famotidine  (PEPCID ) 40 MG tablet TAKE 1 TABLET BY  MOUTH AT  BEDTIME 100 tablet 2   fluticasone  (FLONASE ) 50 MCG/ACT nasal spray Place 1 spray into the nose daily as needed for allergies.     hydrocortisone  (ANUSOL -HC) 25 MG suppository Place 1 suppository (25 mg total) rectally 2 (two) times daily. 30 suppository 2   Melatonin 5 MG CAPS Take 5 mg by mouth at bedtime.     Multiple Vitamin (MULTIVITAMIN WITH MINERALS) TABS tablet Take 1 tablet by mouth daily.     nystatin ointment (MYCOSTATIN) Apply 1 Application topically 2 (two) times daily as needed (yeast / itching). Mix with triamcinolone      omeprazole  (PRILOSEC) 40 MG capsule TAKE 1 CAPSULE BY MOUTH TWICE  DAILY BEFORE MEALS 200 capsule 2   pioglitazone  (ACTOS ) 30 MG tablet TAKE 1 TABLET BY MOUTH DAILY 90 tablet 2   potassium chloride  (MICRO-K ) 10 MEQ CR capsule Take 4 capsules (40 mEq total) by mouth 2 (two) times daily. 240 capsule 0   pravastatin  (PRAVACHOL ) 40 MG tablet TAKE 1 TABLET BY MOUTH AT  BEDTIME 100 tablet 2   PREMARIN  vaginal cream APPLY TWICE A week. 42.5 g 1   PRESCRIPTION MEDICATION Place 1 Application rectally daily as needed (Hemorrhoids). NIFEDIPINE 5% ointment     traZODone  (DESYREL ) 100 MG tablet TAKE 1 TABLET BY MOUTH BEFORE  BEDTIME 90 tablet 3   triamcinolone  ointment (KENALOG ) 0.1 % Apply 1 Application topically 2 (two) times daily as needed (yeast / itching). Mix with nystatin     valsartan  (DIOVAN ) 320 MG tablet TAKE 1 TABLET BY MOUTH DAILY 100 tablet 2   No current facility-administered medications on file prior to visit.   Past Medical History:  Diagnosis Date   Allergic sinusitis 08/27/2022   Arthritis    Atrophic vaginitis 03/31/2021   Cataract    Class 1 obesity due to excess calories with serious comorbidity and body mass index (BMI) of 33.0 to 33.9 in adult 03/28/2022   Encounter for osteoporosis screening in asymptomatic postmenopausal patient 06/28/2022   External hemorrhoids 11/12/2021   Gastro-esophageal reflux disease without esophagitis  06/03/2019   Heart murmur    AVR   History of kidney stones    Hypertension associated with diabetes (HCC) 10/01/2019   Mixed hyperlipidemia    Mucositis (ulcerative) of vagina and vulva 06/21/2022   Neck pain, chronic 10/18/2022   Primary insomnia 06/03/2019   S/P aortic valve replacement with bioprosthetic valve 08/10/2020   21 mm Edwards Resilia Inspiria Bioprosthetic Tissue Valve  SN [id number] Model 11500A   Sleep apnea    cpap   Type 2 diabetes mellitus with diabetic polyneuropathy, without long-term current use of insulin  (HCC) 06/03/2019   Vertigo 07/20/2019   Past Surgical History:  Procedure Laterality Date   ANTERIOR CERVICAL DECOMP/DISCECTOMY FUSION     x 2   ANTERIOR CERVICAL DECOMP/DISCECTOMY FUSION N/A 02/19/2023   Procedure: Anterior Cervical Discectomy Fusion-Cervical Three-Cervical Four, Removal Plate Cervical Four-Cervical Five;  Surgeon: Joaquin Mulberry, MD;  Location: [facility] OR;  Service: Neurosurgery;  Laterality: N/A;  3C   AORTIC VALVE REPLACEMENT N/A 08/10/2020   Procedure: AORTIC VALVE REPLACEMENT (AVR) USING INSPIRIS VALVE SIZE ;  Surgeon: Gardenia Jump, MD;  Location: [facility] OR;  Service: Open Heart Surgery;  Laterality: N/A;   BACK SURGERY  11/17/2016   L3-L5   BILATERAL CARPAL TUNNEL RELEASE     bone spur Left    left shoulder   bone spur Right    Right shoulder   CATARACT EXTRACTION Left 09/18/2021   COLONOSCOPY  08/22/2011   Moderate predominantly sigmoid diverticulosis. Small internal hemorrhoids.    COLONOSCOPY     HEMORROIDECTOMY  02/2019   RIGHT/LEFT HEART CATH AND CORONARY ANGIOGRAPHY N/A 07/27/2020   Procedure: RIGHT/LEFT HEART CATH AND CORONARY ANGIOGRAPHY;  Surgeon: Arnoldo Lapping, MD;  Location: [facility] INVASIVE CV LAB;  Service: Cardiovascular;  Laterality: N/A;   TEE WITHOUT CARDIOVERSION N/A 08/10/2020   Procedure: TRANSESOPHAGEAL ECHOCARDIOGRAM (TEE);  Surgeon: Gardenia Jump, MD;  Location: [facility] OR;  Service: Open Heart Surgery;   Laterality: N/A;   torn rotator cuff Right    Right shoulder   torn rotator cuff Left    left shoulder   TRIGGER FINGER RELEASE Left    thumb   TRIGGER FINGER RELEASE Right    thumb and middle finger   UPPER GASTROINTESTINAL ENDOSCOPY     WRIST SURGERY Left    torn ligament   WRIST SURGERY Right    cyst    Family History  Problem Relation Age of Onset   Diabetes Mother    Heart Problems Mother    Colon cancer Brother 71   Heart Problems Maternal Grandfather    Colon cancer Paternal Grandmother    Esophageal cancer Neg Hx    Rectal cancer Neg Hx    Stomach cancer Neg Hx    Breast cancer Neg Hx    Social History   Socioeconomic History   Marital status: Married    Spouse name: Eddie   Number of children: 2   Years of education: Not on file   Highest education level: Not on file  Occupational History   Not on file  Tobacco Use   Smoking status: Never   Smokeless tobacco: Never  Vaping Use   Vaping status: Never Used  Substance and Sexual Activity   Alcohol use: Never   Drug use: Never   Sexual activity: Yes    Birth control/protection: Post-menopausal  Other Topics Concern   Not on file  Social History Narrative   One son lives in home, the other son lives about 3 miles away   Social Drivers of Health   Financial Resource Strain: Low Risk  (08/18/2023)   Overall Financial Resource Strain (CARDIA)    Difficulty of Paying Living Expenses: Not hard at all  Food Insecurity: No Food Insecurity (08/18/2023)   Hunger Vital Sign    Worried About Running Out of Food in the Last Year: Never true    Ran Out of Food in the Last Year: Never true  Transportation Needs: No Transportation Needs (08/18/2023)   PRAPARE - Administrator, Civil Service (Medical): No    Lack of Transportation (Non-Medical): No  Physical Activity: Inactive (  08/18/2023)   Exercise Vital Sign    Days of Exercise per Week: 0 days    Minutes of Exercise per Session: 0 min  Stress: No  Stress Concern Present (08/18/2023)   [employer] of Occupational Health - Occupational Stress Questionnaire    Feeling of Stress : Not at all  Social Connections: Moderately Integrated (08/18/2023)   Social Connection and Isolation Panel [NHANES]    Frequency of Communication with Friends and Family: More than three times a week    Frequency of Social Gatherings with Friends and Family: More than three times a week    Attends Religious Services: More than 4 times per year    Active Member of [employer] or Organizations: No    Attends Engineer, structural: Never    Marital Status: Married    Objective:  BP 124/72   Pulse 78   Temp 98.2 F (36.8 C)   Ht 5\' 7"  (1.702 m)   Wt 222 lb (100.7 kg)   SpO2 98%   BMI 34.77 kg/m      08/18/2023    9:51 AM 07/01/2023   10:30 AM 06/13/2023   10:31 AM  BP/Weight  Systolic BP 124 132 130  Diastolic BP 72 68 64  Wt. (Lbs) 222 225   BMI 34.77 kg/m2 35.24 kg/m2     Physical Exam Vitals reviewed.  Constitutional:      Appearance: Normal appearance. She is obese.  Neck:     Vascular: No carotid bruit.  Cardiovascular:     Rate and Rhythm: Normal rate and regular rhythm.     Heart sounds: Murmur heard.  Pulmonary:     Effort: Pulmonary effort is normal. No respiratory distress.     Breath sounds: Normal breath sounds.  Abdominal:     General: Abdomen is flat. Bowel sounds are normal.     Palpations: Abdomen is soft.     Tenderness: There is no abdominal tenderness.  Neurological:     Mental Status: She is alert and oriented to person, place, and time.  Psychiatric:        Mood and Affect: Mood normal.        Behavior: Behavior normal.     Diabetic Foot Exam - Simple   Simple Foot Form Diabetic Foot exam was performed with the following findings: Yes 08/18/2023 10:25 PM  Visual Inspection No deformities, no ulcerations, no other skin breakdown bilaterally: Yes Sensation Testing Intact to touch and monofilament testing  bilaterally: Yes Pulse Check Posterior Tibialis and Dorsalis pulse intact bilaterally: Yes Comments      Lab Results  Component Value Date   WBC 5.4 05/20/2023   HGB 11.8 05/20/2023   HCT 35.6 05/20/2023   PLT 230 05/20/2023   GLUCOSE 134 (H) 05/20/2023   CHOL 133 05/20/2023   TRIG 132 05/20/2023   HDL 44 05/20/2023   LDLCALC 66 05/20/2023   ALT 17 05/20/2023   AST 27 05/20/2023   NA 140 05/20/2023   K 4.0 05/20/2023   CL 98 05/20/2023   CREATININE 0.92 05/20/2023   BUN 13 05/20/2023   CO2 29 05/20/2023   TSH 2.100 02/04/2023   INR 1.0 02/13/2023   HGBA1C 6.9 (H) 05/20/2023   MICROALBUR 10 12/13/2020      Assessment & Plan:   Gastro-esophageal reflux disease without esophagitis Assessment & Plan: The current medical regimen is effective;  continue present plan and medications.  Continue famotidine  and omeprazole  once daily.     Type 2  diabetes mellitus with diabetic polyneuropathy, without long-term current use of insulin  [facility]) Assessment & Plan: Well controlled.  On Actos  due to intolerance to multiple other diabetes medications. -Continue current management and monitor blood glucose levels. Recommend check feet daily. Recommend annual eye exams. Continue to work on eating a healthy diet and exercise.  Labs drawn today.     Orders: -     CBC with Differential/Platelet; Future -     Comprehensive metabolic panel with GFR; Future -     Hemoglobin A1c; Future  Mixed hyperlipidemia Assessment & Plan: The current medical regimen is effective;  continue present plan and medications. Continue on  Pravastatin  40 mg before bed.   Orders: -     Lipid panel; Future  Dysuria Assessment & Plan: UA normal.  Orders: -     POCT URINALYSIS DIP (CLINITEK)  Essential hypertension, benign Assessment & Plan: Well controlled.  On Chlorthalidone  and Valsartan . -Continue current management.   Class 1 obesity due to excess calories with serious comorbidity and  body mass index (BMI) of 34.0 to 34.9 in adult Assessment & Plan: Recommend continue to work on eating healthy diet and exercise. Comorbidities: diabetes and hyperlipidemia.    Obstructive sleep apnea syndrome Assessment & Plan: uses cpap. Compliant and she feels like it helps.       No orders of the defined types were placed in this encounter.   Orders Placed This Encounter  Procedures   CBC with Differential/Platelet   Comprehensive metabolic panel with GFR   Hemoglobin A1c   Lipid panel   POCT URINALYSIS DIP (CLINITEK)     Follow-up: Return for lab visit this week, chronic follow up 3 months and one day from her labs.Gladys Lamp I Leal-Borjas,acting as a scribe for Mercy Stall, MD.,have documented all relevant documentation on the behalf of Mercy Stall, MD,as directed by  Mercy Stall, MD while in the presence of Mercy Stall, MD.   An After Visit Summary was printed and given to the patient.  I attest that I have reviewed this visit and agree with the plan scribed by my staff.   Mercy Stall, MD [facility] [phone]

## 2023-08-18 ENCOUNTER — Encounter: Payer: Self-pay | Admitting: Family Medicine

## 2023-08-18 ENCOUNTER — Ambulatory Visit (INDEPENDENT_AMBULATORY_CARE_PROVIDER_SITE_OTHER): Admitting: Family Medicine

## 2023-08-18 VITALS — BP 124/72 | HR 78 | Temp 98.2°F | Ht 67.0 in | Wt 222.0 lb

## 2023-08-18 DIAGNOSIS — E782 Mixed hyperlipidemia: Secondary | ICD-10-CM | POA: Diagnosis not present

## 2023-08-18 DIAGNOSIS — I10 Essential (primary) hypertension: Secondary | ICD-10-CM

## 2023-08-18 DIAGNOSIS — E1142 Type 2 diabetes mellitus with diabetic polyneuropathy: Secondary | ICD-10-CM | POA: Diagnosis not present

## 2023-08-18 DIAGNOSIS — I152 Hypertension secondary to endocrine disorders: Secondary | ICD-10-CM

## 2023-08-18 DIAGNOSIS — R300 Dysuria: Secondary | ICD-10-CM | POA: Diagnosis not present

## 2023-08-18 DIAGNOSIS — G4733 Obstructive sleep apnea (adult) (pediatric): Secondary | ICD-10-CM | POA: Diagnosis not present

## 2023-08-18 DIAGNOSIS — E6609 Other obesity due to excess calories: Secondary | ICD-10-CM

## 2023-08-18 DIAGNOSIS — K219 Gastro-esophageal reflux disease without esophagitis: Secondary | ICD-10-CM

## 2023-08-18 DIAGNOSIS — E66811 Obesity, class 1: Secondary | ICD-10-CM

## 2023-08-18 DIAGNOSIS — Z6834 Body mass index (BMI) 34.0-34.9, adult: Secondary | ICD-10-CM

## 2023-08-18 LAB — POCT URINALYSIS DIP (CLINITEK)
Bilirubin, UA: NEGATIVE
Blood, UA: NEGATIVE
Glucose, UA: NEGATIVE mg/dL
Ketones, POC UA: NEGATIVE mg/dL
Leukocytes, UA: NEGATIVE
Nitrite, UA: NEGATIVE
POC PROTEIN,UA: NEGATIVE
Spec Grav, UA: 1.015 (ref 1.010–1.025)
Urobilinogen, UA: 0.2 U/dL
pH, UA: 6.5 (ref 5.0–8.0)

## 2023-08-18 NOTE — Assessment & Plan Note (Signed)
 Well controlled.  On Chlorthalidone and Valsartan. -Continue current management.

## 2023-08-18 NOTE — Assessment & Plan Note (Signed)
 UA normal.

## 2023-08-18 NOTE — Assessment & Plan Note (Signed)
 Well controlled.  On Actos  due to intolerance to multiple other diabetes medications. -Continue current management and monitor blood glucose levels. Recommend check feet daily. Recommend annual eye exams. Continue to work on eating a healthy diet and exercise.  Labs drawn today.

## 2023-08-18 NOTE — Assessment & Plan Note (Signed)
 The current medical regimen is effective;  continue present plan and medications. Continue on  Pravastatin  40 mg before bed.

## 2023-08-18 NOTE — Assessment & Plan Note (Signed)
 uses cpap. Compliant and she feels like it helps.

## 2023-08-18 NOTE — Assessment & Plan Note (Signed)
Recommend continue to work on eating healthy diet and exercise. Comorbidities: diabetes and hyperlipidemia.  

## 2023-08-18 NOTE — Assessment & Plan Note (Signed)
 The current medical regimen is effective;  continue present plan and medications.  Continue famotidine  and omeprazole  once daily.

## 2023-08-18 NOTE — Assessment & Plan Note (Signed)
>>  ASSESSMENT AND PLAN FOR ESSENTIAL HYPERTENSION, BENIGN WRITTEN ON 08/18/2023 10:30 PM BY COX, KIRSTEN, MD  Well controlled.  On Chlorthalidone  and Valsartan . -Continue current management.

## 2023-08-19 ENCOUNTER — Ambulatory Visit: Payer: Medicare Other | Admitting: Family Medicine

## 2023-08-20 ENCOUNTER — Other Ambulatory Visit

## 2023-08-20 DIAGNOSIS — E782 Mixed hyperlipidemia: Secondary | ICD-10-CM | POA: Diagnosis not present

## 2023-08-20 DIAGNOSIS — E1142 Type 2 diabetes mellitus with diabetic polyneuropathy: Secondary | ICD-10-CM | POA: Diagnosis not present

## 2023-08-20 LAB — LIPID PANEL
Chol/HDL Ratio: 3.3 ratio (ref 0.0–4.4)
Cholesterol, Total: 121 mg/dL (ref 100–199)
HDL: 37 mg/dL — ABNORMAL LOW (ref >39)
LDL Chol Calc (NIH): 61 mg/dL (ref 0–99)
Triglycerides: 127 mg/dL (ref 0–149)
VLDL Cholesterol Cal: 23 mg/dL (ref 5–40)

## 2023-08-21 LAB — COMPREHENSIVE METABOLIC PANEL WITH GFR
ALT: 22 IU/L (ref 0–32)
AST: 25 IU/L (ref 0–40)
Albumin: 4.3 g/dL (ref 3.8–4.8)
Alkaline Phosphatase: 65 IU/L (ref 44–121)
BUN/Creatinine Ratio: 15 (ref 12–28)
BUN: 13 mg/dL (ref 8–27)
Bilirubin Total: 0.4 mg/dL (ref 0.0–1.2)
CO2: 29 mmol/L (ref 20–29)
Calcium: 9.7 mg/dL (ref 8.7–10.3)
Chloride: 99 mmol/L (ref 96–106)
Creatinine, Ser: 0.89 mg/dL (ref 0.57–1.00)
Globulin, Total: 2.7 g/dL (ref 1.5–4.5)
Glucose: 134 mg/dL — ABNORMAL HIGH (ref 70–99)
Potassium: 4.3 mmol/L (ref 3.5–5.2)
Sodium: 141 mmol/L (ref 134–144)
Total Protein: 7 g/dL (ref 6.0–8.5)
eGFR: 69 mL/min/{1.73_m2} (ref >59)

## 2023-08-21 LAB — CBC WITH DIFFERENTIAL/PLATELET
Basophils Absolute: 0 10*3/uL (ref 0.0–0.2)
Basos: 1 %
EOS (ABSOLUTE): 0.2 10*3/uL (ref 0.0–0.4)
Eos: 3 %
Hematocrit: 33.3 % — ABNORMAL LOW (ref 34.0–46.6)
Hemoglobin: 11.1 g/dL (ref 11.1–15.9)
Immature Grans (Abs): 0 10*3/uL (ref 0.0–0.1)
Immature Granulocytes: 0 %
Lymphocytes Absolute: 2.2 10*3/uL (ref 0.7–3.1)
Lymphs: 47 %
MCH: 33.1 pg — ABNORMAL HIGH (ref 26.6–33.0)
MCHC: 33.3 g/dL (ref 31.5–35.7)
MCV: 99 fL — ABNORMAL HIGH (ref 79–97)
Monocytes Absolute: 0.5 10*3/uL (ref 0.1–0.9)
Monocytes: 11 %
Neutrophils Absolute: 1.8 10*3/uL (ref 1.4–7.0)
Neutrophils: 38 %
Platelets: 205 10*3/uL (ref 150–450)
RBC: 3.35 x10E6/uL — ABNORMAL LOW (ref 3.77–5.28)
RDW: 12.5 % (ref 11.7–15.4)
WBC: 4.7 10*3/uL (ref 3.4–10.8)

## 2023-08-21 LAB — HEMOGLOBIN A1C
Est. average glucose Bld gHb Est-mCnc: 148 mg/dL
Hgb A1c MFr Bld: 6.8 % — ABNORMAL HIGH (ref 4.8–5.6)

## 2023-09-08 ENCOUNTER — Ambulatory Visit (INDEPENDENT_AMBULATORY_CARE_PROVIDER_SITE_OTHER)

## 2023-09-08 ENCOUNTER — Ambulatory Visit: Payer: Self-pay

## 2023-09-08 VITALS — BP 110/70 | HR 83 | Temp 97.9°F | Ht 67.0 in | Wt 224.6 lb

## 2023-09-08 DIAGNOSIS — R200 Anesthesia of skin: Secondary | ICD-10-CM | POA: Insufficient documentation

## 2023-09-08 DIAGNOSIS — I10 Essential (primary) hypertension: Secondary | ICD-10-CM | POA: Diagnosis not present

## 2023-09-08 DIAGNOSIS — I872 Venous insufficiency (chronic) (peripheral): Secondary | ICD-10-CM | POA: Insufficient documentation

## 2023-09-08 HISTORY — DX: Anesthesia of skin: R20.0

## 2023-09-08 HISTORY — DX: Venous insufficiency (chronic) (peripheral): I87.2

## 2023-09-08 MED ORDER — CHLORTHALIDONE 25 MG PO TABS: 25.0000 mg | ORAL_TABLET | Freq: Every day | ORAL | 1 refills | Status: DC

## 2023-09-08 NOTE — Progress Notes (Signed)
 Acute Office Visit  Subjective:    Patient ID: Megan Galvan, female    DOB: 10/26/1950, 73 y.o.   MRN: [id number]  Chief Complaint  Patient presents with   Leg Pain    right    HPI: Discussed the use of AI scribe software for clinical note transcription with the patient, who gave verbal consent to proceed.  History of Present Illness   Megan Galvan is a 73 year old female with type 2 diabetes who presents with numbness in her right leg.  She experiences numbness in her right leg, particularly in the calf area, which began this morning upon waking. The numbness is accompanied by mild pain extending down to the ankle. There is some redness in the lower leg, though it is less severe than previously observed. No recent travel or prolonged sitting, although she has been sitting more than usual due to her husband's health issues.  She has a history of back surgery and continues to experience back pain, but no new back pain is reported. She frequently experiences leg cramps, especially in the right thigh and second toe, and uses Biofreeze for relief. No recent changes in activity level and she has not been bedridden.  Her current medications include chlorthalidone  50 mg daily for hypertension, valsartan , and potassium supplements. She recently started taking Prevacid and a hair, skin, and nails vitamin. Her husband is concerned about her cholesterol medication potentially causing leg pain, though the specific medication is not mentioned.  In her social history, she stays home more often to avoid illness due to her husband's upcoming surgeries. She does not add salt to her food and is sensitive to salty foods.  In the review of symptoms, she denies new back pain, recent travel, or prolonged sitting. She reports numbness in the right leg, some redness, and frequent leg cramps, particularly in the right second toe. She denies pain when moving her ankle up and down and reports no significant  changes in her ability to feel sensations in her legs.       Past Medical History:  Diagnosis Date   Allergic sinusitis 08/27/2022   Arthritis    Atrophic vaginitis 03/31/2021   Cataract    Class 1 obesity due to excess calories with serious comorbidity and body mass index (BMI) of 33.0 to 33.9 in adult 03/28/2022   Encounter for osteoporosis screening in asymptomatic postmenopausal patient 06/28/2022   External hemorrhoids 11/12/2021   Gastro-esophageal reflux disease without esophagitis 06/03/2019   Heart murmur    AVR   History of kidney stones    Hypertension associated with diabetes (HCC) 10/01/2019   Mixed hyperlipidemia    Mucositis (ulcerative) of vagina and vulva 06/21/2022   Neck pain, chronic 10/18/2022   Primary insomnia 06/03/2019   S/P aortic valve replacement with bioprosthetic valve 08/10/2020   21 mm Edwards Resilia Inspiria Bioprosthetic Tissue Valve  SN [id number] Model [id number]   Sleep apnea    cpap   Type 2 diabetes mellitus with diabetic polyneuropathy, without long-term current use of insulin  (HCC) 06/03/2019   Vertigo 07/20/2019    Past Surgical History:  Procedure Laterality Date   ANTERIOR CERVICAL DECOMP/DISCECTOMY FUSION     x 2   ANTERIOR CERVICAL DECOMP/DISCECTOMY FUSION N/A 02/19/2023   Procedure: Anterior Cervical Discectomy Fusion-Cervical Three-Cervical Four, Removal Plate Cervical Four-Cervical Five;  Surgeon: Joaquin Mulberry, MD;  Location: [facility] OR;  Service: Neurosurgery;  Laterality: N/A;  3C   AORTIC VALVE REPLACEMENT N/A 08/10/2020  Procedure: AORTIC VALVE REPLACEMENT (AVR) USING INSPIRIS VALVE SIZE ;  Surgeon: Gardenia Jump, MD;  Location: [facility] OR;  Service: Open Heart Surgery;  Laterality: N/A;   BACK SURGERY  11/17/2016   L3-L5   BILATERAL CARPAL TUNNEL RELEASE     bone spur Left    left shoulder   bone spur Right    Right shoulder   CATARACT EXTRACTION Left 09/18/2021   COLONOSCOPY  08/22/2011   Moderate predominantly sigmoid  diverticulosis. Small internal hemorrhoids.    COLONOSCOPY     HEMORROIDECTOMY  02/2019   RIGHT/LEFT HEART CATH AND CORONARY ANGIOGRAPHY N/A 07/27/2020   Procedure: RIGHT/LEFT HEART CATH AND CORONARY ANGIOGRAPHY;  Surgeon: Arnoldo Lapping, MD;  Location: [facility] INVASIVE CV LAB;  Service: Cardiovascular;  Laterality: N/A;   TEE WITHOUT CARDIOVERSION N/A 08/10/2020   Procedure: TRANSESOPHAGEAL ECHOCARDIOGRAM (TEE);  Surgeon: Gardenia Jump, MD;  Location: [facility] OR;  Service: Open Heart Surgery;  Laterality: N/A;   torn rotator cuff Right    Right shoulder   torn rotator cuff Left    left shoulder   TRIGGER FINGER RELEASE Left    thumb   TRIGGER FINGER RELEASE Right    thumb and middle finger   UPPER GASTROINTESTINAL ENDOSCOPY     WRIST SURGERY Left    torn ligament   WRIST SURGERY Right    cyst    Family History  Problem Relation Age of Onset   Diabetes Mother    Heart Problems Mother    Colon cancer Brother 66   Heart Problems Maternal Grandfather    Colon cancer Paternal Grandmother    Esophageal cancer Neg Hx    Rectal cancer Neg Hx    Stomach cancer Neg Hx    Breast cancer Neg Hx     Social History   Socioeconomic History   Marital status: Married    Spouse name: Eddie   Number of children: 2   Years of education: Not on file   Highest education level: Not on file  Occupational History   Not on file  Tobacco Use   Smoking status: Never   Smokeless tobacco: Never  Vaping Use   Vaping status: Never Used  Substance and Sexual Activity   Alcohol use: Never   Drug use: Never   Sexual activity: Yes    Birth control/protection: Post-menopausal  Other Topics Concern   Not on file  Social History Narrative   One son lives in home, the other son lives about 3 miles away   Social Drivers of Health   Financial Resource Strain: Low Risk  (08/18/2023)   Overall Financial Resource Strain (CARDIA)    Difficulty of Paying Living Expenses: Not hard at all  Food Insecurity:  No Food Insecurity (08/18/2023)   Hunger Vital Sign    Worried About Running Out of Food in the Last Year: Never true    Ran Out of Food in the Last Year: Never true  Transportation Needs: No Transportation Needs (08/18/2023)   PRAPARE - Administrator, Civil Service (Medical): No    Lack of Transportation (Non-Medical): No  Physical Activity: Inactive (08/18/2023)   Exercise Vital Sign    Days of Exercise per Week: 0 days    Minutes of Exercise per Session: 0 min  Stress: No Stress Concern Present (08/18/2023)   [employer] of Occupational Health - Occupational Stress Questionnaire    Feeling of Stress : Not at all  Social Connections: Moderately Integrated (08/18/2023)  Social Advertising account executive [NHANES]    Frequency of Communication with Friends and Family: More than three times a week    Frequency of Social Gatherings with Friends and Family: More than three times a week    Attends Religious Services: More than 4 times per year    Active Member of [employer] or Organizations: No    Attends Banker Meetings: Never    Marital Status: Married  Catering manager Violence: Not At Risk (08/18/2023)   Humiliation, Afraid, Rape, and Kick questionnaire    Fear of Current or Ex-Partner: No    Emotionally Abused: No    Physically Abused: No    Sexually Abused: No    Outpatient Medications Prior to Visit  Medication Sig Dispense Refill   ACCU-CHEK GUIDE TEST test strip USE 1 STRIP WITH METER DAILY IN  THE AFTERNOON 100 strip 2   Accu-Chek Softclix Lancets lancets CHECK BLOOD SUGAR DAILY 100 each 2   acetaminophen  (TYLENOL ) 500 MG tablet Take 1,000 mg by mouth 2 (two) times daily.     AMBULATORY NON FORMULARY MEDICATION Diltiazem  2% compounded with lidocaine  5% ointment applied to the rectum 3 times daily for 8 weeks. 1 each 0   AMBULATORY NON FORMULARY MEDICATION Nitroglycerine ointment 0.125 %. Apply a pea sized amount internally four times daily. 30 g 2    aspirin  EC 81 MG tablet Take 1 tablet (81 mg total) by mouth daily. Swallow whole. 90 tablet 3   Blood Glucose Monitoring Suppl (ACCU-CHEK GUIDE ME) w/Device KIT Use as directed 1 kit 0   Calcium Carb-Cholecalciferol (CALCIUM 600 + D PO) Take 2 tablets by mouth daily.     cetirizine (ZYRTEC) 10 MG tablet Take 10 mg by mouth daily.     clotrimazole -betamethasone  (LOTRISONE ) cream Apply 1 Application topically daily. 45 g 3   famotidine  (PEPCID ) 40 MG tablet TAKE 1 TABLET BY MOUTH AT  BEDTIME 100 tablet 2   fluticasone  (FLONASE ) 50 MCG/ACT nasal spray Place 1 spray into the nose daily as needed for allergies.     hydrocortisone  (ANUSOL -HC) 25 MG suppository Place 1 suppository (25 mg total) rectally 2 (two) times daily. 30 suppository 2   Melatonin 5 MG CAPS Take 5 mg by mouth at bedtime.     Multiple Vitamin (MULTIVITAMIN WITH MINERALS) TABS tablet Take 1 tablet by mouth daily.     Multiple Vitamins-Minerals (HAIR SKIN & NAILS) TABS Take 1 tablet by mouth daily.     nystatin ointment (MYCOSTATIN) Apply 1 Application topically 2 (two) times daily as needed (yeast / itching). Mix with triamcinolone      omeprazole  (PRILOSEC) 40 MG capsule TAKE 1 CAPSULE BY MOUTH TWICE  DAILY BEFORE MEALS 200 capsule 2   pioglitazone  (ACTOS ) 30 MG tablet TAKE 1 TABLET BY MOUTH DAILY 90 tablet 2   potassium chloride  (MICRO-K ) 10 MEQ CR capsule Take 4 capsules (40 mEq total) by mouth 2 (two) times daily. 240 capsule 0   potassium chloride  SA (KLOR-CON  M) 20 MEQ tablet Take 20 mEq by mouth 4 (four) times daily.     pravastatin  (PRAVACHOL ) 40 MG tablet TAKE 1 TABLET BY MOUTH AT  BEDTIME 100 tablet 2   PREMARIN  vaginal cream APPLY TWICE A week. 42.5 g 1   PRESCRIPTION MEDICATION Place 1 Application rectally daily as needed (Hemorrhoids). NIFEDIPINE 5% ointment     traZODone  (DESYREL ) 100 MG tablet TAKE 1 TABLET BY MOUTH BEFORE  BEDTIME 90 tablet 3   triamcinolone  ointment (KENALOG ) 0.1 %  Apply 1 Application topically 2  (two) times daily as needed (yeast / itching). Mix with nystatin     valsartan  (DIOVAN ) 320 MG tablet TAKE 1 TABLET BY MOUTH DAILY 100 tablet 2   chlorthalidone  (HYGROTON ) 50 MG tablet TAKE 1 TABLET BY MOUTH DAILY 100 tablet 2   No facility-administered medications prior to visit.    Allergies  Allergen Reactions   Ace Inhibitors Cough   Farxiga [Dapagliflozin] Other (See Comments)    Yeast infections    Jardiance [Empagliflozin] Other (See Comments)    Yeast infections   Keflex [Cephalexin] Itching   Latex Other (See Comments)    Burn and itch   Doxycycline Rash   Metformin And Related Diarrhea   Nexium [Esomeprazole Magnesium ] Diarrhea   Protonix  [Pantoprazole  Sodium] Other (See Comments)    Constipation    Review of Systems  Constitutional:  Negative for chills, fatigue and fever.  HENT:  Negative for congestion, ear pain and sore throat.   Respiratory:  Negative for cough and shortness of breath.   Cardiovascular:  Negative for chest pain.  Gastrointestinal:  Negative for abdominal pain, constipation, diarrhea, nausea and vomiting.  Genitourinary:  Negative for dysuria and frequency.  Musculoskeletal:  Positive for back pain (chronic). Negative for arthralgias and myalgias.  Neurological:  Positive for numbness (acute, started today). Negative for dizziness and headaches.  Psychiatric/Behavioral:  Negative for dysphoric mood. The patient is not nervous/anxious.        Objective:         09/08/2023   11:15 AM 08/18/2023    9:51 AM 07/01/2023   10:30 AM  Vitals with BMI  Height 5\' 7"  5\' 7"  5\' 7"   Weight 224 lbs 10 oz 222 lbs 225 lbs  BMI 35.17 34.76 35.23  Systolic 110 124 161  Diastolic 70 72 68  Pulse 83 78 87    No data found.   Physical Exam Vitals and nursing note reviewed.  Constitutional:      Appearance: Normal appearance. She is obese.  HENT:     Head: Normocephalic and atraumatic.  Cardiovascular:     Rate and Rhythm: Normal rate and regular  rhythm.     Heart sounds: Murmur heard.  Pulmonary:     Effort: Pulmonary effort is normal.     Breath sounds: Normal breath sounds.  Musculoskeletal:     Comments: EXTREMITIES: No cyanosis. Spider veins and varicose veins in both legs. Toes slightly cold to touch. Excellent pulse in right foot. No swelling in right foot or ankle. Trace swelling and trace to 1+ pitting edema in right lower leg. Trace pitting edema in left leg. Tenderness and prominent veins in right leg. Right calf girth 43 cm, left calf girth 41.5 cm. NEUROLOGICAL: Cranial nerves grossly intact, moves all extremities without gross motor or sensory deficit. Sensation intact in both legs with monofilament testing  Neurological:     General: No focal deficit present.     Mental Status: She is alert.  Psychiatric:        Mood and Affect: Mood normal.     Health Maintenance Due  Topic Date Due   Hepatitis C Screening  Never done   Medicare Annual Wellness (AWV)  09/18/2023    There are no preventive care reminders to display for this patient.   Lab Results  Component Value Date   TSH 2.100 02/04/2023   Lab Results  Component Value Date   WBC 4.7 08/20/2023   HGB 11.1 08/20/2023  HCT 33.3 (L) 08/20/2023   MCV 99 (H) 08/20/2023   PLT 205 08/20/2023   Lab Results  Component Value Date   NA 141 08/20/2023   K 4.3 08/20/2023   CO2 29 08/20/2023   GLUCOSE 134 (H) 08/20/2023   BUN 13 08/20/2023   CREATININE 0.89 08/20/2023   BILITOT 0.4 08/20/2023   ALKPHOS 65 08/20/2023   AST 25 08/20/2023   ALT 22 08/20/2023   PROT 7.0 08/20/2023   ALBUMIN  4.3 08/20/2023   CALCIUM 9.7 08/20/2023   ANIONGAP 10 08/13/2020   EGFR 69 08/20/2023   Lab Results  Component Value Date   CHOL 121 08/20/2023   Lab Results  Component Value Date   HDL 37 (L) 08/20/2023   Lab Results  Component Value Date   LDLCALC 61 08/20/2023   Lab Results  Component Value Date   TRIG 127 08/20/2023   Lab Results  Component Value  Date   CHOLHDL 3.3 08/20/2023   Lab Results  Component Value Date   HGBA1C 6.8 (H) 08/20/2023       Assessment & Plan:  Numbness in right leg Assessment & Plan: ACUTE ONSET,  SMALL AREA OF NUMBNESS reported in the medial aspect of right leg, below the right knee. But monofilament testing is normal. No calf tenderness. Less than 3 cm difference in girth of calves bilaterally. No redness.  PLAN: unclear etiology. Could be varicose veins/ leg cramp/ chronic back pain which could have caused the sensation of numbness.  Reassuring exam today.  But advised to watch for warning signs of DVT and to Report back if any worsening symptoms or call 911 for any severe symptoms   She does state understanding.    Essential hypertension Assessment & Plan: Chronic hypertension with well-controlled blood pressure. Current medication includes chlorthalidone  50 mg daily, which may contribute to leg cramps. Discussed reducing chlorthalidone  to 25 mg daily to minimize adverse effects while maintaining blood pressure control. - Decrease chlorthalidone  dose to 25 mg daily as there is no benefit to higher than 25 mg doses for BP control and the side effects of cramping and electrolyte imbalances are worse.  - Monitor blood pressure regularly. - Continue valsartan  320 mg as prescribed. - Avoid excess sodium in diet.  Orders: -     Chlorthalidone ; Take 1 tablet (25 mg total) by mouth daily.  Dispense: 90 tablet; Refill: 1  Venous insufficiency of both lower extremities Assessment & Plan: Pitting edema of right lower leg Pitting edema of the right lower leg with trace to 1+ pitting. Differential includes varicose veins and spider veins. No evidence of deep vein thrombosis (DVT) based on examination and measurements. Advised monitoring for changes suggesting DVT, such as increased pain, redness, or swelling. - Monitor for any increase in pain, redness, or swelling. - Elevate legs when sitting or lying  down. - Wear compression stockings or pantyhose during the day.  Varicose veins of lower extremities Presence of varicose veins and spider veins in both lower extremities, contributing to leg symptoms. No signs of acute complications such as DVT. Recommended use of compression stockings or pantyhose to alleviate symptoms and prevent worsening. - Wear compression stockings or pantyhose during the day. - Elevate legs when sitting or lying down.      Meds ordered this encounter  Medications   chlorthalidone  (HYGROTON ) 25 MG tablet    Sig: Take 1 tablet (25 mg total) by mouth daily.    Dispense:  90 tablet    Refill:  1    Please send a replace/new response with 100-Day Supply if appropriate to maximize member benefit. Requesting 1 year supply. Dose decreased to 25 mg daily    No orders of the defined types were placed in this encounter.  Assessment and Plan            Follow-up: Return if symptoms worsen or fail to improve.  An After Visit Summary was printed and given to the patient.  Chandon Lazcano, MD [facility] [phone]

## 2023-09-08 NOTE — Telephone Encounter (Signed)
 Copied from CRM [id number]. Topic: Clinical - Red Word Triage >> Sep 08, 2023 10:32 AM Megan Galvan wrote: Red Word that prompted transfer to Nurse Triage: patient calling in. Right leg, below the knee cap has a numb area, pain that Galvan and goes down to ankle, and is little puffy, just started this morning.  Chief Complaint: right leg and right knee pain, numbness Symptoms: pain Frequency: started this am Pertinent Negatives: Patient denies sob, cp, calf pain Disposition: [] ED /[] Urgent Care (no appt availability in office) / [x] Appointment(In office/virtual)/ []  [facility]/ [] Home Care/ [] Refused Recommended Disposition /[] [facility]/ []  Follow-up with PCP Additional Notes: apt made per protocol; care advice given, denies questions; instructed to go to ER if becomes worse.   Reason for Disposition  [1] Redness of the skin AND [2] no fever  Answer Assessment - Initial Assessment Questions 1. LOCATION and RADIATION: "Where is the pain located?"      Right knee and leg 2. QUALITY: "What does the pain feel like?"  (e.g., sharp, dull, aching, burning)     numb 3. SEVERITY: "How bad is the pain?" "What does it keep you from doing?"   (Scale 1-10; or mild, moderate, severe)   -  MILD (1-3): doesn't interfere with normal activities    -  MODERATE (4-7): interferes with normal activities (e.g., work or school) or awakens from sleep, limping    -  SEVERE (8-10): excruciating pain, unable to do any normal activities, unable to walk     mild 4. ONSET: "When did the pain start?" "Does it come and go, or is it there all the time?"     This am 5. RECURRENT: "Have you had this pain before?" If Yes, ask: "When, and what happened then?"     no 6. SETTING: "Has there been any recent work, exercise or other activity that involved that part of the body?"      no 7. AGGRAVATING FACTORS: "What makes the knee pain worse?" (e.g., walking, climbing stairs, running)     no 8. ASSOCIATED  SYMPTOMS: "Is there any swelling or redness of the knee?"     Swelling, redness to left side 9. OTHER SYMPTOMS: "Do you have any other symptoms?" (e.g., chest pain, difficulty breathing, fever, calf pain)     denies 10. PREGNANCY: "Is there any chance you are pregnant?" "When was your last menstrual period?"       na  Protocols used: Knee Pain-A-AH

## 2023-09-08 NOTE — Assessment & Plan Note (Signed)
 ACUTE ONSET,  SMALL AREA OF NUMBNESS reported in the medial aspect of right leg, below the right knee. But monofilament testing is normal. No calf tenderness. Less than 3 cm difference in girth of calves bilaterally. No redness.  PLAN: unclear etiology. Could be varicose veins/ leg cramp/ chronic back pain which could have caused the sensation of numbness.  Reassuring exam today.  But advised to watch for warning signs of DVT and to Report back if any worsening symptoms or call 911 for any severe symptoms   She does state understanding.

## 2023-09-08 NOTE — Patient Instructions (Signed)
  VISIT SUMMARY: Today, we addressed your numbness and mild pain in your right leg, which started this morning. We also reviewed your chronic conditions, including hypertension, type 2 diabetes, and post-surgical back pain. We discussed changes to your medication and provided recommendations to manage your symptoms.  YOUR PLAN: CHRONIC HYPERTENSION: Your blood pressure is well-controlled, but your current medication may be causing leg cramps. -Reduce chlorthalidone  dose to 25 mg daily. -Monitor your blood pressure regularly. -Continue taking valsartan  as prescribed. -Avoid excess sodium in your diet.  TYPE 2 DIABETES MELLITUS: Your diabetes is reasonably controlled with a recent A1c of 6.8. -Continue your current diabetes management plan.  PITTING EDEMA OF RIGHT LOWER LEG: You have mild swelling in your right lower leg, likely due to varicose veins. There is no evidence of a blood clot. -Monitor for any increase in pain, redness, or swelling. -Elevate your legs when sitting or lying down. -Wear compression stockings or pantyhose during the day.  VARICOSE VEINS OF LOWER EXTREMITIES: You have varicose veins in both legs, which are contributing to your symptoms. -Wear compression stockings or pantyhose during the day. -Elevate your legs when sitting or lying down.  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 -Continue your current management plan for back pain.                      Contains text generated by [employer].                                 Contains text generated by [employer]bridge.

## 2023-09-08 NOTE — Assessment & Plan Note (Signed)
 Chronic hypertension with well-controlled blood pressure. Current medication includes chlorthalidone  50 mg daily, which may contribute to leg cramps. Discussed reducing chlorthalidone  to 25 mg daily to minimize adverse effects while maintaining blood pressure control. - Decrease chlorthalidone  dose to 25 mg daily as there is no benefit to higher than 25 mg doses for BP control and the side effects of cramping and electrolyte imbalances are worse.  - Monitor blood pressure regularly. - Continue valsartan  320 mg as prescribed. - Avoid excess sodium in diet.

## 2023-09-08 NOTE — Assessment & Plan Note (Signed)
 Pitting edema of right lower leg Pitting edema of the right lower leg with trace to 1+ pitting. Differential includes varicose veins and spider veins. No evidence of deep vein thrombosis (DVT) based on examination and measurements. Advised monitoring for changes suggesting DVT, such as increased pain, redness, or swelling. - Monitor for any increase in pain, redness, or swelling. - Elevate legs when sitting or lying down. - Wear compression stockings or pantyhose during the day.  Varicose veins of lower extremities Presence of varicose veins and spider veins in both lower extremities, contributing to leg symptoms. No signs of acute complications such as DVT. Recommended use of compression stockings or pantyhose to alleviate symptoms and prevent worsening. - Wear compression stockings or pantyhose during the day. - Elevate legs when sitting or lying down.

## 2023-09-19 ENCOUNTER — Other Ambulatory Visit: Payer: Self-pay | Admitting: Family Medicine

## 2023-09-25 ENCOUNTER — Ambulatory Visit

## 2023-09-25 VITALS — Ht 67.0 in | Wt 224.0 lb

## 2023-09-25 DIAGNOSIS — Z0000 Encounter for general adult medical examination without abnormal findings: Secondary | ICD-10-CM

## 2023-09-25 NOTE — Patient Instructions (Signed)
 Megan Galvan , Thank you for taking time out of your busy schedule to complete your Annual Wellness Visit with me. I enjoyed our conversation and look forward to speaking with you again next year. I, as well as your care team,  appreciate your ongoing commitment to your health goals. Please review the following plan we discussed and let me know if I can assist you in the future. Your Game plan/ To Do List    Follow up Visits: Next Medicare AWV with our clinical staff: In 1 year    Have you seen your provider in the last 6 months (3 months if uncontrolled diabetes)? Yes Next Office Visit with your provider: 11/21/23 @ 10:00  Clinician Recommendations:  Aim for 30 minutes of exercise or brisk walking, 6-8 glasses of water, and 5 servings of fruits and vegetables each day.       This is a list of the screening recommended for you and due dates:  Health Maintenance  Topic Date Due   Hepatitis C Screening  Never done   COVID-19 Vaccine (6 - Moderna risk 2024-25 season) 08/02/2023   Zoster (Shingles) Vaccine (1 of 2) 12/09/2023*   Flu Shot  11/28/2023   Eye exam for diabetics  02/04/2024   Hemoglobin A1C  02/19/2024   Yearly kidney health urinalysis for diabetes  05/19/2024   Complete foot exam   08/17/2024   Yearly kidney function blood test for diabetes  08/19/2024   Medicare Annual Wellness Visit  09/24/2024   DTaP/Tdap/Td vaccine (2 - Tdap) 11/22/2024   Mammogram  04/08/2025   Pneumonia Vaccine  Completed   DEXA scan (bone density measurement)  Completed   HPV Vaccine  Aged Out   Meningitis B Vaccine  Aged Out   Colon Cancer Screening  Discontinued  *Topic was postponed. The date shown is not the original due date.    Advanced directives: (ACP Link)Information on Advanced Care Planning can be found at [place]  Secretary of [facility] Advance Health Care Directives Advance Health Care Directives. [email or web address]   Advance Care Planning is important because it:  [x]  Makes sure you receive  the medical care that is consistent with your values, goals, and preferences  [x]  It provides guidance to your family and loved ones and reduces their decisional burden about whether or not they are making the right decisions based on your wishes.  Follow the link provided in your after visit summary or read over the paperwork we have mailed to you to help you started getting your Advance Directives in place. If you need assistance in completing these, please reach out to us  so that we can help you!  See attachments for Preventive Care and Fall Prevention Tips.

## 2023-09-25 NOTE — Progress Notes (Signed)
 Subjective:   Megan Galvan is a 73 y.o. who presents for a Medicare Wellness preventive visit.  As a reminder, Annual Wellness Visits don't include a physical exam, and some assessments may be limited, especially if this visit is performed virtually. We may recommend an in-person follow-up visit with your provider if needed.  Visit Complete: Virtual I connected with  Benuel Brazier on 09/25/23 by a audio enabled telemedicine application and verified that I am speaking with the correct person using two identifiers.  Patient Location: Home  Provider Location: Home Office  I discussed the limitations of evaluation and management by telemedicine. The patient expressed understanding and agreed to proceed.  Vital Signs: Because this visit was a virtual/telehealth visit, some criteria may be missing or patient reported. Any vitals not documented were not able to be obtained and vitals that have been documented are patient reported.  VideoDeclined- This patient declined Librarian, academic. Therefore the visit was completed with audio only.  Persons Participating in Visit: Patient.  AWV Questionnaire: No: Patient Medicare AWV questionnaire was not completed prior to this visit.  Cardiac Risk Factors include: advanced age (>69men, >29 women);diabetes mellitus;dyslipidemia;hypertension     Objective:     Today's Vitals   09/25/23 1312  Weight: 224 lb (101.6 kg)  Height: 5\' 7"  (1.702 m)   Body mass index is 35.08 kg/m.     09/25/2023    2:12 PM 02/13/2023    1:16 PM 09/17/2021    2:19 PM 08/15/2020   10:17 AM 08/08/2020   12:21 PM 07/31/2020    1:25 PM 07/27/2020    2:14 PM  Advanced Directives  Does Patient Have a Medical Advance Directive? No No No No No  No  Would patient like information on creating a medical advance directive? Yes (MAU/Ambulatory/Procedural Areas - Information given) No - Patient declined No - Patient declined No - Patient declined  No - Patient declined No - Patient declined No - Patient declined    Current Medications (verified) Outpatient Encounter Medications as of 09/25/2023  Medication Sig   ACCU-CHEK GUIDE TEST test strip USE 1 STRIP WITH METER DAILY IN  THE AFTERNOON   Accu-Chek Softclix Lancets lancets CHECK BLOOD SUGAR DAILY   acetaminophen  (TYLENOL ) 500 MG tablet Take 1,000 mg by mouth 2 (two) times daily.   AMBULATORY NON FORMULARY MEDICATION Diltiazem  2% compounded with lidocaine  5% ointment applied to the rectum 3 times daily for 8 weeks.   AMBULATORY NON FORMULARY MEDICATION Nitroglycerine ointment 0.125 %. Apply a pea sized amount internally four times daily.   aspirin  EC 81 MG tablet Take 1 tablet (81 mg total) by mouth daily. Swallow whole.   Blood Glucose Monitoring Suppl (ACCU-CHEK GUIDE ME) w/Device KIT Use as directed   Calcium Carb-Cholecalciferol (CALCIUM 600 + D PO) Take 2 tablets by mouth daily.   cetirizine (ZYRTEC) 10 MG tablet Take 10 mg by mouth daily.   chlorthalidone  (HYGROTON ) 25 MG tablet Take 1 tablet (25 mg total) by mouth daily.   clotrimazole -betamethasone  (LOTRISONE ) cream Apply 1 Application topically daily.   famotidine  (PEPCID ) 40 MG tablet TAKE 1 TABLET BY MOUTH AT  BEDTIME   fluticasone  (FLONASE ) 50 MCG/ACT nasal spray Place 1 spray into the nose daily as needed for allergies.   hydrocortisone  (ANUSOL -HC) 25 MG suppository Place 1 suppository (25 mg total) rectally 2 (two) times daily.   Melatonin 5 MG CAPS Take 5 mg by mouth at bedtime.   Multiple Vitamin (MULTIVITAMIN WITH MINERALS)  TABS tablet Take 1 tablet by mouth daily.   Multiple Vitamins-Minerals (HAIR SKIN & NAILS) TABS Take 1 tablet by mouth daily.   nystatin ointment (MYCOSTATIN) Apply 1 Application topically 2 (two) times daily as needed (yeast / itching). Mix with triamcinolone    omeprazole  (PRILOSEC) 40 MG capsule TAKE 1 CAPSULE BY MOUTH TWICE  DAILY BEFORE MEALS   pioglitazone  (ACTOS ) 30 MG tablet TAKE 1 TABLET  BY MOUTH DAILY   potassium chloride  SA (KLOR-CON  M) 20 MEQ tablet Take 20 mEq by mouth 4 (four) times daily.   pravastatin  (PRAVACHOL ) 40 MG tablet TAKE 1 TABLET BY MOUTH AT  BEDTIME   PREMARIN  vaginal cream APPLY TWICE A week.   PRESCRIPTION MEDICATION Place 1 Application rectally daily as needed (Hemorrhoids). NIFEDIPINE 5% ointment   traZODone  (DESYREL ) 100 MG tablet TAKE 1 TABLET BY MOUTH BEFORE  BEDTIME   triamcinolone  ointment (KENALOG ) 0.1 % Apply 1 Application topically 2 (two) times daily as needed (yeast / itching). Mix with nystatin   valsartan  (DIOVAN ) 320 MG tablet TAKE 1 TABLET BY MOUTH DAILY   potassium chloride  (MICRO-K ) 10 MEQ CR capsule Take 4 capsules (40 mEq total) by mouth 2 (two) times daily. (Patient not taking: Reported on 09/25/2023)   No facility-administered encounter medications on file as of 09/25/2023.    Allergies (verified) Ace inhibitors, Farxiga [dapagliflozin], Jardiance [empagliflozin], Keflex [cephalexin], Latex, Doxycycline, Metformin and related, Nexium [esomeprazole magnesium ], and Protonix  [pantoprazole  sodium]   History: Past Medical History:  Diagnosis Date   Allergic sinusitis 08/27/2022   Arthritis    Atrophic vaginitis 03/31/2021   Cataract    Class 1 obesity due to excess calories with serious comorbidity and body mass index (BMI) of 33.0 to 33.9 in adult 03/28/2022   Encounter for osteoporosis screening in asymptomatic postmenopausal patient 06/28/2022   External hemorrhoids 11/12/2021   Gastro-esophageal reflux disease without esophagitis 06/03/2019   Heart murmur    AVR   History of kidney stones    Hypertension associated with diabetes (HCC) 10/01/2019   Mixed hyperlipidemia    Mucositis (ulcerative) of vagina and vulva 06/21/2022   Neck pain, chronic 10/18/2022   Primary insomnia 06/03/2019   S/P aortic valve replacement with bioprosthetic valve 08/10/2020   21 mm Edwards Resilia Inspiria Bioprosthetic Tissue Valve  SN [id number]  Model 11500A   Sleep apnea    cpap   Type 2 diabetes mellitus with diabetic polyneuropathy, without long-term current use of insulin  (HCC) 06/03/2019   Vertigo 07/20/2019   Past Surgical History:  Procedure Laterality Date   ANTERIOR CERVICAL DECOMP/DISCECTOMY FUSION     x 2   ANTERIOR CERVICAL DECOMP/DISCECTOMY FUSION N/A 02/19/2023   Procedure: Anterior Cervical Discectomy Fusion-Cervical Three-Cervical Four, Removal Plate Cervical Four-Cervical Five;  Surgeon: Joaquin Mulberry, MD;  Location: [facility] OR;  Service: Neurosurgery;  Laterality: N/A;  3C   AORTIC VALVE REPLACEMENT N/A 08/10/2020   Procedure: AORTIC VALVE REPLACEMENT (AVR) USING INSPIRIS VALVE SIZE ;  Surgeon: Gardenia Jump, MD;  Location: [facility] OR;  Service: Open Heart Surgery;  Laterality: N/A;   BACK SURGERY  11/17/2016   L3-L5   BILATERAL CARPAL TUNNEL RELEASE     bone spur Left    left shoulder   bone spur Right    Right shoulder   CATARACT EXTRACTION Left 09/18/2021   COLONOSCOPY  08/22/2011   Moderate predominantly sigmoid diverticulosis. Small internal hemorrhoids.    COLONOSCOPY     HEMORROIDECTOMY  02/2019   RIGHT/LEFT HEART CATH AND CORONARY ANGIOGRAPHY N/A  07/27/2020   Procedure: RIGHT/LEFT HEART CATH AND CORONARY ANGIOGRAPHY;  Surgeon: Arnoldo Lapping, MD;  Location: [facility] INVASIVE CV LAB;  Service: Cardiovascular;  Laterality: N/A;   TEE WITHOUT CARDIOVERSION N/A 08/10/2020   Procedure: TRANSESOPHAGEAL ECHOCARDIOGRAM (TEE);  Surgeon: Gardenia Jump, MD;  Location: [facility] OR;  Service: Open Heart Surgery;  Laterality: N/A;   torn rotator cuff Right    Right shoulder   torn rotator cuff Left    left shoulder   TRIGGER FINGER RELEASE Left    thumb   TRIGGER FINGER RELEASE Right    thumb and middle finger   UPPER GASTROINTESTINAL ENDOSCOPY     WRIST SURGERY Left    torn ligament   WRIST SURGERY Right    cyst   Family History  Problem Relation Age of Onset   Diabetes Mother    Heart Problems Mother     Colon cancer Brother 35   Heart Problems Maternal Grandfather    Colon cancer Paternal Grandmother    Esophageal cancer Neg Hx    Rectal cancer Neg Hx    Stomach cancer Neg Hx    Breast cancer Neg Hx    Social History   Socioeconomic History   Marital status: Married    Spouse name: Eddie   Number of children: 2   Years of education: Not on file   Highest education level: Not on file  Occupational History   Not on file  Tobacco Use   Smoking status: Never   Smokeless tobacco: Never  Vaping Use   Vaping status: Never Used  Substance and Sexual Activity   Alcohol use: Never   Drug use: Never   Sexual activity: Yes    Birth control/protection: Post-menopausal  Other Topics Concern   Not on file  Social History Narrative   One son lives in home, the other son lives about 3 miles away   Social Drivers of Health   Financial Resource Strain: Low Risk  (09/25/2023)   Overall Financial Resource Strain (CARDIA)    Difficulty of Paying Living Expenses: Not hard at all  Food Insecurity: No Food Insecurity (09/25/2023)   Hunger Vital Sign    Worried About Running Out of Food in the Last Year: Never true    Ran Out of Food in the Last Year: Never true  Transportation Needs: No Transportation Needs (09/25/2023)   PRAPARE - Administrator, Civil Service (Medical): No    Lack of Transportation (Non-Medical): No  Physical Activity: Inactive (09/25/2023)   Exercise Vital Sign    Days of Exercise per Week: 0 days    Minutes of Exercise per Session: 0 min  Stress: No Stress Concern Present (09/25/2023)   [employer] of Occupational Health - Occupational Stress Questionnaire    Feeling of Stress : Not at all  Social Connections: Moderately Integrated (09/25/2023)   Social Connection and Isolation Panel [NHANES]    Frequency of Communication with Friends and Family: More than three times a week    Frequency of Social Gatherings with Friends and Family: More than three  times a week    Attends Religious Services: More than 4 times per year    Active Member of [employer] or Organizations: No    Attends Banker Meetings: Never    Marital Status: Married    Tobacco Counseling Counseling given: Not Answered    Clinical Intake:  Pre-visit preparation completed: Yes  Pain : No/denies pain     Diabetes: Yes CBG  done?: No Did pt. bring in CBG monitor from home?: No  Lab Results  Component Value Date   HGBA1C 6.8 (H) 08/20/2023   HGBA1C 6.9 (H) 05/20/2023   HGBA1C 7.1 (H) 02/04/2023     How often do you need to have someone help you when you read instructions, pamphlets, or other written materials from your doctor or pharmacy?: 1 - Never  Interpreter Needed?: No  Information entered by :: Seabron Cypress LPN   Activities of Daily Living     09/25/2023    2:11 PM 02/13/2023    1:18 PM  In your present state of health, do you have any difficulty performing the following activities:  Hearing? 0   Vision? 0   Difficulty concentrating or making decisions? 0   Walking or climbing stairs? 0   Dressing or bathing? 0   Doing errands, shopping? 0 1  Comment  Pt does not currently drive related to neck issues  Preparing Food and eating ? N   Using the Toilet? N   In the past six months, have you accidently leaked urine? N   Do you have problems with loss of bowel control? N   Managing your Medications? N   Managing your Finances? N   Housekeeping or managing your Housekeeping? N     Patient Care Team: Mercy Stall, MD as PCP - General (Family Medicine) Devon Fogo, [facility] (Inactive) as Pharmacist (Pharmacist) Lajuan Pila, MD as Consulting Physician (Gastroenterology) Hassan Links, MD as Consulting Physician (Cardiology) Barbera Books, OD (Ophthalmology) Joaquin Mulberry, MD as Consulting Physician (Neurosurgery)  Indicate any recent Medical Services you may have received from other than [facility] providers in the past  year (date may be approximate).     Assessment:    This is a routine wellness examination for Megan Galvan.  Hearing/Vision screen Hearing Screening - Comments:: Denies hearing difficulties   Vision Screening - Comments:: Wears rx glasses - up to date with routine eye exams with Dr. Otho Blitz    Goals Addressed             This Visit's Progress    DIET - EAT MORE FRUITS AND VEGETABLES   On track    Eat a better diet for diabetes. Wants a better layout of foods to help maintain glucose.        Depression Screen     09/25/2023    2:10 PM 09/08/2023   11:17 AM 05/20/2023    9:55 AM 10/16/2022    9:58 AM 09/18/2022   11:03 AM 08/27/2022    3:28 PM 06/28/2022   10:15 AM  PHQ 2/9 Scores  PHQ - 2 Score 0 0 2 0 0 0 0  PHQ- 9 Score   4 1       Fall Risk     09/25/2023    2:11 PM 09/08/2023   11:17 AM 05/20/2023    9:55 AM 10/16/2022    9:47 AM 09/18/2022   10:53 AM  Fall Risk   Falls in the past year? 0 0 0 0 0  Number falls in past yr: 0 0 0 0 0  Injury with Fall? 0 0 0 0 0  Risk for fall due to : No Fall Risks No Fall Risks No Fall Risks No Fall Risks No Fall Risks  Follow up Falls prevention discussed;Education provided;Falls evaluation completed  Falls evaluation completed Falls evaluation completed;Falls prevention discussed Falls evaluation completed    MEDICARE RISK AT HOME:  Medicare  Risk at Home Any stairs in or around the home?: No If so, are there any without handrails?: No Home free of loose throw rugs in walkways, pet beds, electrical cords, etc?: Yes Adequate lighting in your home to reduce risk of falls?: Yes Life alert?: No Use of a cane, walker or w/c?: No Grab bars in the bathroom?: Yes Shower chair or bench in shower?: No Elevated toilet seat or a handicapped toilet?: Yes  TIMED UP AND GO:  Was the test performed?  No  Cognitive Function: 6CIT completed    08/18/2023   10:30 AM  MMSE - Mini Mental State Exam  Orientation to time 5  Orientation to Place  5  Registration 3  Registration-comments [facility] D[facility], [facility]  Attention/ Calculation 5  Recall 2  Language- name 2 objects 2  Language- repeat 1  Language- follow 3 step command 3  Language- read & follow direction 1  Write a sentence 1  Copy design 1  Total score 29        09/25/2023    2:12 PM 09/18/2022   11:16 AM 09/17/2021    2:20 PM 03/27/2020    3:15 PM  6CIT Screen  What Year? 0 points 0 points 0 points 0 points  What month? 0 points 0 points 0 points 0 points  What time? 0 points 0 points 0 points 0 points  Count back from 20 0 points 0 points 0 points 0 points  Months in reverse 2 points 0 points 0 points 0 points  Repeat phrase 0 points 2 points 4 points 4 points  Total Score 2 points 2 points 4 points 4 points    Immunizations Immunization History  Administered Date(s) Administered   Fluad Quad(high Dose 65+) 02/12/2022   Hepatitis B 06/22/2013, 11/16/2014, 05/26/2015   Influenza, High Dose Seasonal PF 12/29/2018   Influenza-Unspecified 01/27/2014, 01/24/2020, 01/27/2021, 02/01/2023   Moderna Covid-19 Fall Seasonal Vaccine 82yrs & older 05/02/2022, 02/01/2023   Moderna Covid-19 Vaccine Bivalent Booster 76yrs & up 03/16/2021   Moderna SARS-COV2 Booster Vaccination 02/29/2020, 09/19/2020   Moderna Sars-Covid-2 Vaccination 06/07/2019, 07/05/2019   PNEUMOCOCCAL CONJUGATE-20 09/13/2021   Pfizer(Comirnaty)Fall Seasonal Vaccine 12 years and older 02/01/2023   Pneumococcal Conjugate-13 12/29/2013   Pneumococcal Polysaccharide-23 04/13/2012   Respiratory Syncytial Virus Vaccine,Recomb Aduvanted(Arexvy) 05/02/2022   Td 11/23/2014   Zoster, Live 02/08/2013    Screening Tests Health Maintenance  Topic Date Due   Hepatitis C Screening  Never done   COVID-19 Vaccine (6 - Moderna risk 2024-25 season) 08/02/2023   Zoster Vaccines- Shingrix (1 of 2) 12/09/2023 (Originally 05/28/1969)   INFLUENZA VACCINE  11/28/2023   OPHTHALMOLOGY EXAM  02/04/2024   HEMOGLOBIN  A1C  02/19/2024   Diabetic kidney evaluation - Urine ACR  05/19/2024   FOOT EXAM  08/17/2024   Diabetic kidney evaluation - eGFR measurement  08/19/2024   Medicare Annual Wellness (AWV)  09/24/2024   DTaP/Tdap/Td (2 - Tdap) 11/22/2024   MAMMOGRAM  04/08/2025   Pneumonia Vaccine 75+ Years old  Completed   DEXA SCAN  Completed   HPV VACCINES  Aged Out   Meningococcal B Vaccine  Aged Out   Colonoscopy  Discontinued    Health Maintenance  Health Maintenance Due  Topic Date Due   Hepatitis C Screening  Never done   COVID-19 Vaccine (6 - Moderna risk 2024-25 season) 08/02/2023   Health Maintenance Items Addressed: Information provided on Shingrix   Additional Screening:  Vision Screening: Recommended annual ophthalmology exams for early detection  of glaucoma and other disorders of the eye.  Dental Screening: Recommended annual dental exams for proper oral hygiene  Community Resource Referral / Chronic Care Management: CRR required this visit?  No   CCM required this visit?  No   Plan:    I have personally reviewed and noted the following in the patient's chart:   Medical and social history Use of alcohol, tobacco or illicit drugs  Current medications and supplements including opioid prescriptions. Patient is not currently taking opioid prescriptions. Functional ability and status Nutritional status Physical activity Advanced directives List of other physicians Hospitalizations, surgeries, and ER visits in previous 12 months Vitals Screenings to include cognitive, depression, and falls Referrals and appointments  In addition, I have reviewed and discussed with patient certain preventive protocols, quality metrics, and best practice recommendations. A written personalized care plan for preventive services as well as general preventive health recommendations were provided to patient.   Seabron Cypress [place], [place]   2/95/2841   After Visit Summary: (Mail) Due to  this being a telephonic visit, the after visit summary with patients personalized plan was offered to patient via mail   Notes: Nothing significant to report at this time.

## 2023-10-03 ENCOUNTER — Other Ambulatory Visit: Payer: Self-pay | Admitting: Family Medicine

## 2023-10-03 DIAGNOSIS — E782 Mixed hyperlipidemia: Secondary | ICD-10-CM

## 2023-10-14 ENCOUNTER — Telehealth: Payer: Self-pay

## 2023-10-14 NOTE — Telephone Encounter (Signed)
 Patient came by today and dropped off blood pressure readings. Readings are as followed below.   126/65 HR 69  134/65 HR 60  121/64 HR 69   127/65 HR 62  146/72 HR 55  127/67 HR 60  135/63 HR 70  117/67 HR 72  140/76 HR 63  All readings are after medication change to 25 mg Chlorthalidone .

## 2023-10-14 NOTE — Telephone Encounter (Signed)
Spoke with patient, verbalized understanding and had no questions at this time.  

## 2023-10-24 ENCOUNTER — Other Ambulatory Visit: Payer: Self-pay | Admitting: Family Medicine

## 2023-10-24 DIAGNOSIS — I10 Essential (primary) hypertension: Secondary | ICD-10-CM

## 2023-11-04 DIAGNOSIS — M542 Cervicalgia: Secondary | ICD-10-CM | POA: Diagnosis not present

## 2023-11-17 ENCOUNTER — Ambulatory Visit: Admitting: Cardiology

## 2023-11-17 ENCOUNTER — Ambulatory Visit

## 2023-11-17 VITALS — BP 120/70 | HR 67 | Ht 67.0 in | Wt 223.0 lb

## 2023-11-17 DIAGNOSIS — I5032 Chronic diastolic (congestive) heart failure: Secondary | ICD-10-CM | POA: Insufficient documentation

## 2023-11-17 DIAGNOSIS — I10 Essential (primary) hypertension: Secondary | ICD-10-CM | POA: Diagnosis not present

## 2023-11-17 DIAGNOSIS — E782 Mixed hyperlipidemia: Secondary | ICD-10-CM

## 2023-11-17 DIAGNOSIS — Z953 Presence of xenogenic heart valve: Secondary | ICD-10-CM

## 2023-11-17 HISTORY — DX: Chronic diastolic (congestive) heart failure: I50.32

## 2023-11-17 MED ORDER — FUROSEMIDE 20 MG PO TABS: 20.0000 mg | ORAL_TABLET | ORAL | 3 refills | Status: DC

## 2023-11-17 NOTE — Assessment & Plan Note (Signed)
 Well-controlled. Continue chlorthalidone  25 mg once daily. Continue valsartan  320 mg once daily.

## 2023-11-17 NOTE — Patient Instructions (Signed)
 Medication Instructions:  Your physician has recommended you make the following change in your medication:   START: Lasix  20 mg three times weekly  *If you need a refill on your cardiac medications before your next appointment, please call your pharmacy*  Lab Work: None If you have labs (blood work) drawn today and your tests are completely normal, you will receive your results only by: MyChart Message (if you have MyChart) OR A paper copy in the mail If you have any lab test that is abnormal or we need to change your treatment, we will call you to review the results.  Testing/Procedures: Your physician has requested that you have an echocardiogram. Echocardiography is a painless test that uses sound waves to create images of your heart. It provides your doctor with information about the size and shape of your heart and how well your heart's chambers and valves are working. This procedure takes approximately one hour. There are no restrictions for this procedure. Please do NOT wear cologne, perfume, aftershave, or lotions (deodorant is allowed). Please arrive 15 minutes prior to your appointment time.  Please note: We ask at that you not bring children with you during ultrasound (echo/ vascular) testing. Due to room size and safety concerns, children are not allowed in the ultrasound rooms during exams. Our front office staff cannot provide observation of children in our lobby area while testing is being conducted. An adult accompanying a patient to their appointment will only be allowed in the ultrasound room at the discretion of the ultrasound technician under special circumstances. We apologize for any inconvenience.   Follow-Up: At [facility], you and your health needs are our priority.  As part of our continuing mission to provide you with exceptional heart care, our providers are all part of one team.  This team includes your primary Cardiologist (physician) and Advanced  Practice Providers or APPs (Physician Assistants and Nurse Practitioners) who all work together to provide you with the care you need, when you need it.  Your next appointment:   6 month(s)  Provider:   [name], MD    We recommend signing up for the patient portal called MyChart.  Sign up information is provided on this After Visit Summary.  MyChart is used to connect with patients for Virtual Visits (Telemedicine).  Patients are able to view lab/test results, encounter notes, upcoming appointments, etc.  Non-urgent messages can be sent to your provider as well.   To learn more about what you can do with MyChart, go to [email or web address].   Other Instructions Daily weights   Low-Sodium Eating Plan Salt (sodium) helps you keep a healthy balance of fluids in your body. Too much sodium can raise your blood pressure. It can also cause fluid and waste to be held in your body. Your health care provider or dietitian may recommend a low-sodium eating plan if you have high blood pressure (hypertension), kidney disease, liver disease, or heart failure. Eating less sodium can help lower your blood pressure and reduce swelling. It can also protect your heart, liver, and kidneys. What are tips for following this plan? Reading food labels  Check food labels for the amount of sodium per serving. If you eat more than one serving, you must multiply the listed amount by the number of servings. Choose foods with less than 140 milligrams (mg) of sodium per serving. Avoid foods with 300 mg of sodium or more per serving. Always check how much sodium is in a product, even if  the label says unsalted or no salt added. Shopping  Buy products labeled as low-sodium or no salt added. Buy fresh foods. Avoid canned foods and pre-made or frozen meals. Avoid canned, cured, or processed meats. Buy breads that have less than 80 mg of sodium per slice. Cooking  Eat more home-cooked food. Try  to eat less restaurant, buffet, and fast food. Try not to add salt when you cook. Use salt-free seasonings or herbs instead of table salt or sea salt. Check with your provider or pharmacist before using salt substitutes. Cook with plant-based oils, such as canola, sunflower, or olive oil. Meal planning When eating at a restaurant, ask if your food can be made with less salt or no salt. Avoid dishes labeled as brined, pickled, cured, or smoked. Avoid dishes made with soy sauce, miso, or teriyaki sauce. Avoid foods that have monosodium glutamate (MSG) in them. MSG may be added to some restaurant food, sauces, soups, bouillon, and canned foods. Make meals that can be grilled, baked, poached, roasted, or steamed. These are often made with less sodium. General information Try to limit your sodium intake to 1,500-2,300 mg each day, or the amount told by your provider. What foods should I eat? Fruits Fresh, frozen, or canned fruit. Fruit juice. Vegetables Fresh or frozen vegetables. No salt added canned vegetables. No salt added tomato sauce and paste. Low-sodium or reduced-sodium tomato and vegetable juice. Grains Low-sodium cereals, such as oats, puffed wheat and rice, and shredded wheat. Low-sodium crackers. Unsalted rice. Unsalted pasta. Low-sodium bread. Whole grain breads and whole grain pasta. Meats and other proteins Fresh or frozen meat, poultry, seafood, and fish. These should have no added salt. Low-sodium canned tuna and salmon. Unsalted nuts. Dried peas, beans, and lentils without added salt. Unsalted canned beans. Eggs. Unsalted nut butters. Dairy Milk. Soy milk. Cheese that is naturally low in sodium, such as ricotta cheese, fresh mozzarella, or Swiss cheese. Low-sodium or reduced-sodium cheese. Cream cheese. Yogurt. Seasonings and condiments Fresh and dried herbs and spices. Salt-free seasonings. Low-sodium mustard and ketchup. Sodium-free salad dressing. Sodium-free light  mayonnaise. Fresh or refrigerated horseradish. Lemon juice. Vinegar. Other foods Homemade, reduced-sodium, or low-sodium soups. Unsalted popcorn and pretzels. Low-salt or salt-free chips. The items listed above may not be all the foods and drinks you can have. Talk to a dietitian to learn more. What foods should I avoid? Vegetables Sauerkraut, pickled vegetables, and relishes. Olives. [place] fries. Onion rings. Regular canned vegetables, except low-sodium or reduced-sodium items. Regular canned tomato sauce and paste. Regular tomato and vegetable juice. Frozen vegetables in sauces. Grains Instant hot cereals. Bread stuffing, pancake, and biscuit mixes. Croutons. Seasoned rice or pasta mixes. Noodle soup cups. Boxed or frozen macaroni and cheese. Regular salted crackers. Self-rising flour. Meats and other proteins Meat or fish that is salted, canned, smoked, spiced, or pickled. Precooked or cured meat, such as sausages or meat loaves. [place]. Ham. Pepperoni. Hot dogs. Corned beef. Chipped beef. Salt pork. Jerky. Pickled herring, anchovies, and sardines. Regular canned tuna. Salted nuts. Dairy Processed cheese and cheese spreads. Hard cheeses. Cheese curds. Blue cheese. Feta cheese. String cheese. Regular cottage cheese. Buttermilk. Canned milk. Fats and oils Salted butter. Regular margarine. Ghee. Bacon fat. Seasonings and condiments Onion salt, garlic salt, seasoned salt, table salt, and sea salt. Canned and packaged gravies. Worcestershire sauce. Tartar sauce. Barbecue sauce. Teriyaki sauce. Soy sauce, including reduced-sodium soy sauce. Steak sauce. Fish sauce. Oyster sauce. Cocktail sauce. Horseradish that you find on the shelf. Regular ketchup  and mustard. Meat flavorings and tenderizers. Bouillon cubes. Hot sauce. Pre-made or packaged marinades. Pre-made or packaged taco seasonings. Relishes. Regular salad dressings. Salsa. Other foods Salted popcorn and pretzels. Corn chips and puffs. Potato  and tortilla chips. Canned or dried soups. Pizza. Frozen entrees and pot pies. The items listed above may not be all the foods and drinks you should avoid. Talk to a dietitian to learn more. This information is not intended to replace advice given to you by your health care provider. Make sure you discuss any questions you have with your health care provider. Document Revised: 05/02/2022 Document Reviewed: 05/02/2022 Elsevier Patient Education  2024 [employer].

## 2023-11-17 NOTE — Assessment & Plan Note (Addendum)
 Of valvular heart disease with grade 1 diastolic dysfunction on echocardiogram August 2023 EF 60 to 65%. Mild bilateral lower extremity edema suggestive of diastolic heart failure. Recommended strongly about low-salt diet below 2 g/day. Will introduce low-dose Lasix  20 mg 3 times a week. For this week advised to take it back-to-back on 3 days and from next week on take it spread out over 3 days a week Monday Wednesday Friday or the days of her choosing. . Continue with valsartan .  Will review transthoracic echocardiogram for any significant change in diastolic dysfunction and pulmonary pressures.

## 2023-11-17 NOTE — Assessment & Plan Note (Signed)
 Last echocardiogram to review is from August 2023. Mean gradient 14 mmHg, V-max 2.4 m/s, dimensionless index 0.35.  Follow-up with repeat echocardiogram for any interval change.  Continue with antibiotic before any surgical procedures for endocarditis prophylaxis.

## 2023-11-17 NOTE — Progress Notes (Signed)
 Cardiology Consultation:    Date:  11/17/2023   ID:  Megan Galvan, DOB 08-24-1950, MRN [id number]  PCP:  Sherre Clapper, MD  Cardiologist:  Alean SAUNDERS Kelicia Youtz, MD   Referring MD: Sherre Clapper, MD   Chief Complaint  Patient presents with   Annual Exam     ASSESSMENT AND PLAN:   [name]  73 year old Galvan with history of severe aortic stenosis s/p surgical aortic valve replacement [21 mm Edwards  Resilia Inspira bioprosthetic 08/10/2020], transient postop atrial fibrillation managed with amiodarone , minimal coronary atherosclerosis on cardiac cath March 2022 prior to valve surgery, diabetes, hyperlipidemia, obstructive sleep apnea-uses CPAP, GERD, vertigo, s/p C-spine surgery October 2024. Last echocardiogram at to review August 2023 with normal biventricular function EF 60 to 65%, grade 1 diastolic dysfunction, mild MR, well-seated bioprosthetic aortic valve with mean gradient 14 mmHg and V-max 2.4 m/s DI 0.35  Here for follow-up visit Problem List Items Addressed This Visit     Mixed hyperlipidemia   Well-controlled on current regimen with pravastatin  40 mg once daily. Lipid panel from 08/20/2023 total cholesterol 121, HDL 37, LDL 61 and triglycerides 127.       S/P aortic valve replacement with bioprosthetic valve - Primary   Last echocardiogram to review is from August 2023. Mean gradient 14 mmHg, V-max 2.4 m/s, dimensionless index 0.35.  Follow-up with repeat echocardiogram for any interval change.  Continue with antibiotic before any surgical procedures for endocarditis prophylaxis.       Essential hypertension   Well-controlled. Continue chlorthalidone  25 mg once daily. Continue valsartan  320 mg once daily.       Chronic diastolic CHF (congestive heart failure), NYHA class 2 (HCC)   Of valvular heart disease with grade 1 diastolic dysfunction on echocardiogram August 2023 EF 60 to 65%. Mild bilateral lower extremity edema suggestive of diastolic heart  failure. Recommended strongly about low-salt diet below 2 g/day. Will introduce low-dose Lasix  20 mg 3 times a week. For this week advised to take it back-to-back on 3 days and from next week on take it spread out over 3 days a week Monday Wednesday Friday or the days of her choosing. . Continue with valsartan .  Will review transthoracic echocardiogram for any significant change in diastolic dysfunction and pulmonary pressures.      Return to clinic tentatively in 6 months.   History of Present Illness:    Megan Galvan is a 73 y.o. female who is being seen today for follow-up visit. PCP Galvan, Kirsten, MD. Last visit with us  in the office was 11-18-2022 with Dr. Monetta.  Has history of severe aortic stenosis s/p surgical aortic valve replacement [21 mm Edwards  Resilia Inspira bioprosthetic 08/10/2020], transient postop atrial fibrillation managed with amiodarone , minimal coronary atherosclerosis on cardiac cath March 2022 prior to valve surgery, diabetes, hyperlipidemia, obstructive sleep apnea-uses CPAP, GERD, vertigo.  S/p C-spine surgery October 2024. Last echocardiogram at to review August 2023 with normal biventricular function EF 60 to 65%, grade 1 diastolic dysfunction, mild MR, well-seated bioprosthetic aortic valve with mean gradient 14 mmHg and V-max 2.4 m/s DI 0.35  Megan Galvan lives with her husband at home. Denies any active cardiac symptoms such as chest pain, shortness of breath, orthopnea or paroxysmal nocturnal dyspnea.  Ambulates without any cane or walker.  Does report back pain and left lower extremity pain.  Mentions over the last few weeks has been observing bilateral lower extremity edema extending up to the knees on some days. She is  not very diligent about salt restriction given tendency to consume canned foods and frozen foods at home.  Denies any lightheadedness, dizziness, syncopal episodes or near syncopal episodes. Denies any blood in urine or  stools. Good compliance with her medications.   Past Medical History:  Diagnosis Date   Allergic sinusitis 08/27/2022   Arthritis    Atrophic vaginitis 03/31/2021   Cataract    Class 1 obesity due to excess calories with serious comorbidity and body mass index (BMI) of 33.0 to 33.9 in adult 03/28/2022   Encounter for osteoporosis screening in asymptomatic postmenopausal patient 06/28/2022   External hemorrhoids 11/12/2021   Gastro-esophageal reflux disease without esophagitis 06/03/2019   Heart murmur    AVR   History of kidney stones    Hypertension associated with diabetes (HCC) 10/01/2019   Mixed hyperlipidemia    Mucositis (ulcerative) of vagina and vulva 06/21/2022   Neck pain, chronic 10/18/2022   Primary insomnia 06/03/2019   S/P aortic valve replacement with bioprosthetic valve 08/10/2020   21 mm Edwards Resilia Inspiria Bioprosthetic Tissue Valve  SN [id number] Model 11500A   Sleep apnea    cpap   Type 2 diabetes mellitus with diabetic polyneuropathy, without long-term current use of insulin  (HCC) 06/03/2019   Vertigo 07/20/2019    Past Surgical History:  Procedure Laterality Date   ANTERIOR CERVICAL DECOMP/DISCECTOMY FUSION     x 2   ANTERIOR CERVICAL DECOMP/DISCECTOMY FUSION N/A 02/19/2023   Procedure: Anterior Cervical Discectomy Fusion-Cervical Three-Cervical Four, Removal Plate Cervical Four-Cervical Five;  Surgeon: Joshua Alm Hamilton, MD;  Location: [facility] OR;  Service: Neurosurgery;  Laterality: N/A;  3C   AORTIC VALVE REPLACEMENT N/A 08/10/2020   Procedure: AORTIC VALVE REPLACEMENT (AVR) USING INSPIRIS VALVE SIZE ;  Surgeon: Dusty Sudie DEL, MD;  Location: [facility] OR;  Service: Open Heart Surgery;  Laterality: N/A;   BACK SURGERY  11/17/2016   L3-L5   BILATERAL CARPAL TUNNEL RELEASE     bone spur Left    left shoulder   bone spur Right    Right shoulder   CATARACT EXTRACTION Left 09/18/2021   COLONOSCOPY  08/22/2011   Moderate predominantly sigmoid  diverticulosis. Small internal hemorrhoids.    COLONOSCOPY     HEMORROIDECTOMY  02/2019   RIGHT/LEFT HEART CATH AND CORONARY ANGIOGRAPHY N/A 07/27/2020   Procedure: RIGHT/LEFT HEART CATH AND CORONARY ANGIOGRAPHY;  Surgeon: Wonda Sharper, MD;  Location: [facility] INVASIVE CV LAB;  Service: Cardiovascular;  Laterality: N/A;   TEE WITHOUT CARDIOVERSION N/A 08/10/2020   Procedure: TRANSESOPHAGEAL ECHOCARDIOGRAM (TEE);  Surgeon: Dusty Sudie DEL, MD;  Location: [facility] OR;  Service: Open Heart Surgery;  Laterality: N/A;   torn rotator cuff Right    Right shoulder   torn rotator cuff Left    left shoulder   TRIGGER FINGER RELEASE Left    thumb   TRIGGER FINGER RELEASE Right    thumb and middle finger   UPPER GASTROINTESTINAL ENDOSCOPY     WRIST SURGERY Left    torn ligament   WRIST SURGERY Right    cyst    Current Medications: Current Meds  Medication Sig   ACCU-CHEK GUIDE TEST test strip USE 1 STRIP WITH METER DAILY IN  THE AFTERNOON   Accu-Chek Softclix Lancets lancets CHECK BLOOD SUGAR DAILY   acetaminophen  (TYLENOL ) 500 MG tablet Take 1,000 mg by mouth 2 (two) times daily.   AMBULATORY NON FORMULARY MEDICATION Diltiazem  2% compounded with lidocaine  5% ointment applied to the rectum 3 times daily for 8 weeks.  AMBULATORY NON FORMULARY MEDICATION Nitroglycerine ointment 0.125 %. Apply a pea sized amount internally four times daily.   aspirin  EC 81 MG tablet Take 1 tablet (81 mg total) by mouth daily. Swallow whole.   Blood Glucose Monitoring Suppl (ACCU-CHEK GUIDE ME) w/Device KIT Use as directed   Calcium Carb-Cholecalciferol (CALCIUM 600 + D PO) Take 2 tablets by mouth daily.   cetirizine (ZYRTEC) 10 MG tablet Take 10 mg by mouth daily.   chlorthalidone  (HYGROTON ) 25 MG tablet Take 1 tablet (25 mg total) by mouth daily.   clotrimazole -betamethasone  (LOTRISONE ) cream Apply 1 Application topically daily.   famotidine  (PEPCID ) 40 MG tablet TAKE 1 TABLET BY MOUTH AT  BEDTIME   fluticasone   (FLONASE ) 50 MCG/ACT nasal spray Place 1 spray into the nose daily as needed for allergies.   hydrocortisone  (ANUSOL -HC) 25 MG suppository Place 1 suppository (25 mg total) rectally 2 (two) times daily.   Melatonin 5 MG CAPS Take 5 mg by mouth at bedtime.   Multiple Vitamin (MULTIVITAMIN WITH MINERALS) TABS tablet Take 1 tablet by mouth daily.   Multiple Vitamins-Minerals (HAIR SKIN & NAILS) TABS Take 1 tablet by mouth daily.   nystatin ointment (MYCOSTATIN) Apply 1 Application topically 2 (two) times daily as needed (yeast / itching). Mix with triamcinolone    omeprazole  (PRILOSEC) 40 MG capsule TAKE 1 CAPSULE BY MOUTH TWICE  DAILY BEFORE MEALS   pioglitazone  (ACTOS ) 30 MG tablet TAKE 1 TABLET BY MOUTH DAILY   potassium chloride  SA (KLOR-CON  M) 20 MEQ tablet TAKE 2 TABLETS BY MOUTH TWICE  DAILY   pravastatin  (PRAVACHOL ) 40 MG tablet TAKE 1 TABLET BY MOUTH AT  BEDTIME   PREMARIN  vaginal cream APPLY TWICE A week.   PRESCRIPTION MEDICATION Place 1 Application rectally daily as needed (Hemorrhoids). NIFEDIPINE 5% ointment   traZODone  (DESYREL ) 100 MG tablet TAKE 1 TABLET BY MOUTH BEFORE  BEDTIME   triamcinolone  ointment (KENALOG ) 0.1 % Apply 1 Application topically 2 (two) times daily as needed (yeast / itching). Mix with nystatin   valsartan  (DIOVAN ) 320 MG tablet TAKE 1 TABLET BY MOUTH DAILY     Allergies:   Ace inhibitors, Farxiga [dapagliflozin], Jardiance [empagliflozin], Keflex [cephalexin], Latex, Doxycycline, Metformin and related, Nexium [esomeprazole magnesium ], and Protonix  [pantoprazole  sodium]   Social History   Socioeconomic History   Marital status: Married    Spouse name: Eddie   Number of children: 2   Years of education: Not on file   Highest education level: Not on file  Occupational History   Not on file  Tobacco Use   Smoking status: Never   Smokeless tobacco: Never  Vaping Use   Vaping status: Never Used  Substance and Sexual Activity   Alcohol use: Never   Drug  use: Never   Sexual activity: Yes    Birth control/protection: Post-menopausal  Other Topics Concern   Not on file  Social History Narrative   One son lives in home, the other son lives about 3 miles away   Social Drivers of Health   Financial Resource Strain: Low Risk  (09/25/2023)   Overall Financial Resource Strain (CARDIA)    Difficulty of Paying Living Expenses: Not hard at all  Food Insecurity: No Food Insecurity (09/25/2023)   Hunger Vital Sign    Worried About Running Out of Food in the Last Year: Never true    Ran Out of Food in the Last Year: Never true  Transportation Needs: No Transportation Needs (09/25/2023)   PRAPARE - Transportation  Lack of Transportation (Medical): No    Lack of Transportation (Non-Medical): No  Physical Activity: Inactive (09/25/2023)   Exercise Vital Sign    Days of Exercise per Week: 0 days    Minutes of Exercise per Session: 0 min  Stress: No Stress Concern Present (09/25/2023)   [employer] of Occupational Health - Occupational Stress Questionnaire    Feeling of Stress : Not at all  Social Connections: Moderately Integrated (09/25/2023)   Social Connection and Isolation Panel    Frequency of Communication with Friends and Family: More than three times a week    Frequency of Social Gatherings with Friends and Family: More than three times a week    Attends Religious Services: More than 4 times per year    Active Member of [employer] or Organizations: No    Attends Engineer, structural: Never    Marital Status: Married     Family History: The patient's family history includes Colon cancer in her paternal grandmother; Colon cancer (age of onset: 41) in her brother; Diabetes in her mother; Heart Problems in her maternal grandfather and mother. There is no history of Esophageal cancer, Rectal cancer, Stomach cancer, or Breast cancer. ROS:   Please see the history of present illness.    All 14 point review of systems negative except as  described per history of present illness.  EKGs/Labs/Other Studies Reviewed:    The following studies were reviewed today:   EKG:       Recent Labs: 02/04/2023: TSH 2.100 08/20/2023: ALT 22; BUN 13; Creatinine, Ser 0.89; Hemoglobin 11.1; Platelets 205; Potassium 4.3; Sodium 141  Recent Lipid Panel    Component Value Date/Time   CHOL 121 08/20/2023 1000   TRIG 127 08/20/2023 1000   HDL 37 (L) 08/20/2023 1000   CHOLHDL 3.3 08/20/2023 1000   LDLCALC 61 08/20/2023 1000    Physical Exam:    VS:  BP 120/70   Pulse 67   Ht 5' 7 (1.702 m)   Wt 223 lb (101.2 kg)   SpO2 96%   BMI 34.93 kg/m     Wt Readings from Last 3 Encounters:  11/17/23 223 lb (101.2 kg)  09/25/23 224 lb (101.6 kg)  09/08/23 224 lb 9.6 oz (101.9 kg)     GENERAL:  Well nourished, well developed in no acute distress NECK: No JVD; No carotid bruits CARDIAC: RRR, S1 and S2 present, 4/6 harsh ejection systolic murmur best heard right upper sternal border. CHEST: Sternotomy scar present.  Clear to auscultation without rales, wheezing or rhonchi  Extremities: No pitting pedal edema. Pulses bilaterally symmetric with radial 2+ and dorsalis pedis 2+ NEUROLOGIC:  Alert and oriented x 3  Medication Adjustments/Labs and Tests Ordered: Current medicines are reviewed at length with the patient today.  Concerns regarding medicines are outlined above.  No orders of the defined types were placed in this encounter.  No orders of the defined types were placed in this encounter.   Signed, Alean jess Kobus, MD, MPH, [facility]. 11/17/2023 1:31 PM    [facility]

## 2023-11-17 NOTE — Assessment & Plan Note (Signed)
 Well-controlled on current regimen with pravastatin  40 mg once daily. Lipid panel from 08/20/2023 total cholesterol 121, HDL 37, LDL 61 and triglycerides 127.

## 2023-11-20 NOTE — Progress Notes (Signed)
 Subjective:  Patient ID: Megan Galvan, female    DOB: 1950/09/29  Age: 73 y.o. MRN: [id number]  Chief Complaint  Patient presents with   Medical Management of Chronic Issues   HPI: Diabetes:  Complications: diabetic polyneuropathy. Glucose checking:  daily  Glucose logs: Blood sugar range 130-140s  Hypoglycemia:none Most recent A1C: 6.% Current medications: Actos  30 mg daily.   Last Eye Exam: 10/2022.   Foot checks: daily. Diet: Eating more fruit. Avoiding carbs/sweets.  Exercise: none.   Hyperlipidemia: Current medications:  Pravastatin  40 mg daily    Hypertension: Current medications:  Chlorthalidone  25 mg daily, Valsartan  320 mg daily Cardiologist prescribed furosemide  20 mg to be taken three times a week   GERD: well controlled on famotidine  and omeprazole  once daily.    OSA: uses cpap. Compliant and she feels like it helps. Takes trazodone  for insomnia, which is effective despite recent stress  Aortic valve replacement. Had postop atrial fibrillation managed on amiodarone . No recurrences. Recent visit with Dr. Liborio. History of diastolic CONGESTIVE HEART FAILURE. Started on lasix  20 mg three times a week    Musculoskeletal pain includes Intermittent back, shoulder, and leg pain. Uses tylenol  and occasionally uses an oxycodone .  - History of cervical and lumbar surgery.       11/21/2023   10:08 AM 09/25/2023    2:10 PM 09/08/2023   11:17 AM 05/20/2023    9:55 AM 10/16/2022    9:58 AM  Depression screen PHQ 2/9  Decreased Interest 0 0 0 1 0  Down, Depressed, Hopeless 1 0 0 1 0  PHQ - 2 Score 1 0 0 2 0  Altered sleeping 1   1 0  Tired, decreased energy 1   1 1   Change in appetite 0   0 0  Feeling bad or failure about yourself  0   0 0  Trouble concentrating 0   0 0  Moving slowly or fidgety/restless 1   0 0  Suicidal thoughts 0   0 0  PHQ-9 Score 4   4 1   Difficult doing work/chores Somewhat difficult   Somewhat difficult Not difficult at all         09/25/2023    2:11 PM  Fall Risk   Falls in the past year? 0  Number falls in past yr: 0  Injury with Fall? 0  Risk for fall due to : No Fall Risks  Follow up Falls prevention discussed;Education provided;Falls evaluation completed    Patient Care Team: Sherre Clapper, MD as PCP - General (Family Medicine) Nyle Rankin POUR, [facility] (Inactive) as Pharmacist (Pharmacist) Charlanne Groom, MD as Consulting Physician (Gastroenterology) Monetta Redell PARAS, MD as Consulting Physician (Cardiology) Vannie Elsie HERO, OD (Ophthalmology) Joshua Alm Hamilton, MD as Consulting Physician (Neurosurgery)   Review of Systems  Constitutional:  Negative for chills, fatigue and fever.  HENT:  Negative for congestion, ear pain, rhinorrhea and sore throat.   Respiratory:  Negative for cough and shortness of breath.   Cardiovascular:  Negative for chest pain.  Gastrointestinal:  Negative for abdominal pain, constipation, diarrhea, nausea and vomiting.  Genitourinary:  Negative for dysuria and urgency.  Musculoskeletal:  Positive for arthralgias, back pain and myalgias.  Neurological:  Negative for dizziness, weakness, light-headedness and headaches.  Psychiatric/Behavioral:  Negative for dysphoric mood. The patient is not nervous/anxious.     Current Outpatient Medications on File Prior to Visit  Medication Sig Dispense Refill   ACCU-CHEK GUIDE TEST test strip USE 1 STRIP  WITH METER DAILY IN  THE AFTERNOON 100 strip 2   Accu-Chek Softclix Lancets lancets CHECK BLOOD SUGAR DAILY 100 each 2   acetaminophen  (TYLENOL ) 500 MG tablet Take 1,000 mg by mouth 2 (two) times daily.     AMBULATORY NON FORMULARY MEDICATION Diltiazem  2% compounded with lidocaine  5% ointment applied to the rectum 3 times daily for 8 weeks. 1 each 0   AMBULATORY NON FORMULARY MEDICATION Nitroglycerine ointment 0.125 %. Apply a pea sized amount internally four times daily. 30 g 2   aspirin  EC 81 MG tablet Take 1 tablet (81 mg total) by mouth  daily. Swallow whole. 90 tablet 3   Blood Glucose Monitoring Suppl (ACCU-CHEK GUIDE ME) w/Device KIT Use as directed 1 kit 0   Calcium Carb-Cholecalciferol (CALCIUM 600 + D PO) Take 2 tablets by mouth daily.     cetirizine (ZYRTEC) 10 MG tablet Take 10 mg by mouth daily.     chlorthalidone  (HYGROTON ) 25 MG tablet Take 1 tablet (25 mg total) by mouth daily. 90 tablet 1   clotrimazole -betamethasone  (LOTRISONE ) cream Apply 1 Application topically daily. 45 g 3   famotidine  (PEPCID ) 40 MG tablet TAKE 1 TABLET BY MOUTH AT  BEDTIME 100 tablet 2   fluticasone  (FLONASE ) 50 MCG/ACT nasal spray Place 1 spray into the nose daily as needed for allergies.     furosemide  (LASIX ) 20 MG tablet Take 1 tablet (20 mg total) by mouth 3 (three) times a week. 45 tablet 3   hydrocortisone  (ANUSOL -HC) 25 MG suppository Place 1 suppository (25 mg total) rectally 2 (two) times daily. 30 suppository 2   Melatonin 5 MG CAPS Take 5 mg by mouth at bedtime.     Multiple Vitamin (MULTIVITAMIN WITH MINERALS) TABS tablet Take 1 tablet by mouth daily.     Multiple Vitamins-Minerals (HAIR SKIN & NAILS) TABS Take 1 tablet by mouth daily.     nystatin ointment (MYCOSTATIN) Apply 1 Application topically 2 (two) times daily as needed (yeast / itching). Mix with triamcinolone      omeprazole  (PRILOSEC) 40 MG capsule TAKE 1 CAPSULE BY MOUTH TWICE  DAILY BEFORE MEALS 200 capsule 2   pioglitazone  (ACTOS ) 30 MG tablet TAKE 1 TABLET BY MOUTH DAILY 100 tablet 2   potassium chloride  SA (KLOR-CON  M) 20 MEQ tablet TAKE 2 TABLETS BY MOUTH TWICE  DAILY 400 tablet 2   pravastatin  (PRAVACHOL ) 40 MG tablet TAKE 1 TABLET BY MOUTH AT  BEDTIME 100 tablet 2   PREMARIN  vaginal cream APPLY TWICE A week. 42.5 g 1   PRESCRIPTION MEDICATION Place 1 Application rectally daily as needed (Hemorrhoids). NIFEDIPINE 5% ointment     traZODone  (DESYREL ) 100 MG tablet TAKE 1 TABLET BY MOUTH BEFORE  BEDTIME 90 tablet 3   triamcinolone  ointment (KENALOG ) 0.1 % Apply 1  Application topically 2 (two) times daily as needed (yeast / itching). Mix with nystatin     valsartan  (DIOVAN ) 320 MG tablet TAKE 1 TABLET BY MOUTH DAILY 100 tablet 2   No current facility-administered medications on file prior to visit.   Past Medical History:  Diagnosis Date   Allergic sinusitis 08/27/2022   Arthritis    Atrophic vaginitis 03/31/2021   Cataract    Class 1 obesity due to excess calories with serious comorbidity and body mass index (BMI) of 33.0 to 33.9 in adult 03/28/2022   Encounter for osteoporosis screening in asymptomatic postmenopausal patient 06/28/2022   External hemorrhoids 11/12/2021   Gastro-esophageal reflux disease without esophagitis 06/03/2019   Heart murmur  AVR   History of kidney stones    Hypertension associated with diabetes (HCC) 10/01/2019   Mixed hyperlipidemia    Mucositis (ulcerative) of vagina and vulva 06/21/2022   Neck pain, chronic 10/18/2022   Primary insomnia 06/03/2019   S/P aortic valve replacement with bioprosthetic valve 08/10/2020   21 mm Edwards Resilia Inspiria Bioprosthetic Tissue Valve  SN [id number] Model 11500A   Sleep apnea    cpap   Type 2 diabetes mellitus with diabetic polyneuropathy, without long-term current use of insulin  (HCC) 06/03/2019   Vertigo 07/20/2019   Past Surgical History:  Procedure Laterality Date   ANTERIOR CERVICAL DECOMP/DISCECTOMY FUSION     x 2   ANTERIOR CERVICAL DECOMP/DISCECTOMY FUSION N/A 02/19/2023   Procedure: Anterior Cervical Discectomy Fusion-Cervical Three-Cervical Four, Removal Plate Cervical Four-Cervical Five;  Surgeon: Joshua Alm Hamilton, MD;  Location: [facility] OR;  Service: Neurosurgery;  Laterality: N/A;  3C   AORTIC VALVE REPLACEMENT N/A 08/10/2020   Procedure: AORTIC VALVE REPLACEMENT (AVR) USING INSPIRIS VALVE SIZE ;  Surgeon: Dusty Sudie DEL, MD;  Location: [facility] OR;  Service: Open Heart Surgery;  Laterality: N/A;   BACK SURGERY  11/17/2016   L3-L5   BILATERAL CARPAL TUNNEL  RELEASE     bone spur Left    left shoulder   bone spur Right    Right shoulder   CATARACT EXTRACTION Left 09/18/2021   COLONOSCOPY  08/22/2011   Moderate predominantly sigmoid diverticulosis. Small internal hemorrhoids.    COLONOSCOPY     HEMORROIDECTOMY  02/2019   RIGHT/LEFT HEART CATH AND CORONARY ANGIOGRAPHY N/A 07/27/2020   Procedure: RIGHT/LEFT HEART CATH AND CORONARY ANGIOGRAPHY;  Surgeon: Wonda Sharper, MD;  Location: [facility] INVASIVE CV LAB;  Service: Cardiovascular;  Laterality: N/A;   TEE WITHOUT CARDIOVERSION N/A 08/10/2020   Procedure: TRANSESOPHAGEAL ECHOCARDIOGRAM (TEE);  Surgeon: Dusty Sudie DEL, MD;  Location[facility] OR;  Service: Open Heart Surgery;  Laterality: N/A;   torn rotator cuff Right    Right shoulder   torn rotator cuff Left    left shoulder   TRIGGER FINGER RELEASE Left    thumb   TRIGGER FINGER RELEASE Right    thumb and middle finger   UPPER GASTROINTESTINAL ENDOSCOPY     WRIST SURGERY Left    torn ligament   WRIST SURGERY Right    cyst    Family History  Problem Relation Age of Onset   Diabetes Mother    Heart Problems Mother    Colon cancer Brother 91   Heart Problems Maternal Grandfather    Colon cancer Paternal Grandmother    Esophageal cancer Neg Hx    Rectal cancer Neg Hx    Stomach cancer Neg Hx    Breast cancer Neg Hx    Social History   Socioeconomic History   Marital status: Married    Spouse name: Eddie   Number of children: 2   Years of education: Not on file   Highest education level: Not on file  Occupational History   Not on file  Tobacco Use   Smoking status: Never   Smokeless tobacco: Never  Vaping Use   Vaping status: Never Used  Substance and Sexual Activity   Alcohol use: Never   Drug use: Never   Sexual activity: Yes    Birth control/protection: Post-menopausal  Other Topics Concern   Not on file  Social History Narrative   One son lives in home, the other son lives about 3 miles away   Social Drivers of  Health   Financial Resource Strain: Low Risk  (09/25/2023)   Overall Financial Resource Strain (CARDIA)    Difficulty of Paying Living Expenses: Not hard at all  Food Insecurity: No Food Insecurity (09/25/2023)   Hunger Vital Sign    Worried About Running Out of Food in the Last Year: Never true    Ran Out of Food in the Last Year: Never true  Transportation Needs: No Transportation Needs (09/25/2023)   PRAPARE - Administrator, Civil Service (Medical): No    Lack of Transportation (Non-Medical): No  Physical Activity: Inactive (09/25/2023)   Exercise Vital Sign    Days of Exercise per Week: 0 days    Minutes of Exercise per Session: 0 min  Stress: No Stress Concern Present (09/25/2023)   [employer] of Occupational Health - Occupational Stress Questionnaire    Feeling of Stress : Not at all  Social Connections: Moderately Integrated (09/25/2023)   Social Connection and Isolation Panel    Frequency of Communication with Friends and Family: More than three times a week    Frequency of Social Gatherings with Friends and Family: More than three times a week    Attends Religious Services: More than 4 times per year    Active Member of [employer] or Organizations: No    Attends Banker Meetings: Never    Marital Status: Married    Objective:  BP 132/78   Pulse 83   Temp 98.1 F (36.7 C)   Ht 5' 7 (1.702 m)   Wt 241 lb (109.3 kg)   SpO2 98%   BMI 37.75 kg/m      11/21/2023   10:06 AM 11/17/2023   12:59 PM 09/25/2023    1:12 PM  BP/Weight  Systolic BP 132 120 --  Diastolic BP 78 70 --  Wt. (Lbs) 241 223 224  BMI 37.75 kg/m2 34.93 kg/m2 35.08 kg/m2    Physical Exam Vitals reviewed.  Constitutional:      Appearance: Normal appearance. She is obese.  Neck:     Vascular: No carotid bruit.  Cardiovascular:     Rate and Rhythm: Normal rate and regular rhythm.     Heart sounds: Normal heart sounds.  Pulmonary:     Effort: Pulmonary effort is normal.  No respiratory distress.     Breath sounds: Normal breath sounds.  Abdominal:     General: Abdomen is flat. Bowel sounds are normal.     Palpations: Abdomen is soft.     Tenderness: There is no abdominal tenderness.  Neurological:     Mental Status: She is alert and oriented to person, place, and time.  Psychiatric:        Mood and Affect: Mood normal.        Behavior: Behavior normal.      Diabetic foot exam was performed with the following findings:   Normal sensation of 10g monofilament Intact posterior tibialis and dorsalis pedis pulses Callus on left foot.       Lab Results  Component Value Date   WBC 5.1 11/21/2023   HGB 11.5 11/21/2023   HCT 35.6 11/21/2023   PLT 232 11/21/2023   GLUCOSE 132 (H) 11/21/2023   CHOL 124 11/21/2023   TRIG 125 11/21/2023   HDL 39 (L) 11/21/2023   LDLCALC 63 11/21/2023   ALT 24 11/21/2023   AST 36 11/21/2023   NA 138 11/21/2023   K 4.0 11/21/2023   CL 96 11/21/2023   CREATININE 0.98 11/21/2023  BUN 14 11/21/2023   CO2 26 11/21/2023   TSH 2.100 02/04/2023   INR 1.0 02/13/2023   HGBA1C 7.0 (H) 11/21/2023   MICROALBUR 10 12/13/2020      Assessment & Plan:  Type 2 diabetes mellitus with diabetic polyneuropathy, without long-term current use of insulin  (HCC) Assessment & Plan: Well controlled.  On Actos  due to intolerance to multiple other diabetes medications. -Continue current management and monitor blood glucose levels. Recommend check feet daily. Recommend annual eye exams. Continue to work on eating a healthy diet and exercise.  Labs drawn today.     Orders: -     CBC with Differential/Platelet -     Comprehensive metabolic panel with GFR -     Hemoglobin A1c  Mixed hyperlipidemia Assessment & Plan: Well-controlled on current regimen with pravastatin  40 mg once daily.    Orders: -     Lipid panel  Essential hypertension Assessment & Plan: Well controlled.  No changes to medicines. Chlorthalidone  25 mg  once daily, Valsartan  320 mg daily Continue to work on eating a healthy diet and exercise.  Labs drawn today.     Gastro-esophageal reflux disease without esophagitis Assessment & Plan: The current medical regimen is effective;  continue present plan and medications.   Continue famotidine  and omeprazole  once daily.     Obstructive sleep apnea syndrome Assessment & Plan: uses cpap. Compliant and she feels like it helps.    Need for hepatitis C screening test -     HCV Ab w Reflex to Quant PCR  Presence of heart assist device (HCC)  Paroxysmal atrial fibrillation (HCC) Assessment & Plan: No recurrences.   Class 2 severe obesity with serious comorbidity and body mass index (BMI) of 37.0 to 37.9 in adult, unspecified obesity type [facility]) Assessment & Plan: Recommend continue to work on eating healthy diet and exercise. Comorbidities: diabetes, hypertension   S/P aortic valve replacement with bioprosthetic valve Assessment & Plan: S/p aortic valve replacement.  Requires dental antibiotic prophylaxis    Other orders -     Interpretation:     No orders of the defined types were placed in this encounter.   Orders Placed This Encounter  Procedures   CBC with Differential/Platelet   Comprehensive metabolic panel with GFR   Hemoglobin A1c   Lipid panel   HCV Ab w Reflex to Quant PCR   Interpretation:     Follow-up: Return in about 3 months (around 02/21/2024) for chronic follow up.   I,Marla I Leal-Borjas,acting as a scribe for Abigail Free, MD.,have documented all relevant documentation on the behalf of Abigail Free, MD,as directed by  Abigail Free, MD while in the presence of Abigail Free, MD.    An After Visit Summary was printed and given to the patient. I attest that I have reviewed this visit and agree with the plan scribed by my staff.   Abigail Free, MD [facility] [phone]

## 2023-11-21 ENCOUNTER — Ambulatory Visit (INDEPENDENT_AMBULATORY_CARE_PROVIDER_SITE_OTHER): Admitting: Family Medicine

## 2023-11-21 ENCOUNTER — Encounter: Payer: Self-pay | Admitting: Family Medicine

## 2023-11-21 VITALS — BP 132/78 | HR 83 | Temp 98.1°F | Ht 67.0 in | Wt 241.0 lb

## 2023-11-21 DIAGNOSIS — Z1159 Encounter for screening for other viral diseases: Secondary | ICD-10-CM

## 2023-11-21 DIAGNOSIS — I480 Paroxysmal atrial fibrillation: Secondary | ICD-10-CM

## 2023-11-21 DIAGNOSIS — K219 Gastro-esophageal reflux disease without esophagitis: Secondary | ICD-10-CM | POA: Diagnosis not present

## 2023-11-21 DIAGNOSIS — Z953 Presence of xenogenic heart valve: Secondary | ICD-10-CM | POA: Diagnosis not present

## 2023-11-21 DIAGNOSIS — G4733 Obstructive sleep apnea (adult) (pediatric): Secondary | ICD-10-CM | POA: Diagnosis not present

## 2023-11-21 DIAGNOSIS — E1142 Type 2 diabetes mellitus with diabetic polyneuropathy: Secondary | ICD-10-CM | POA: Diagnosis not present

## 2023-11-21 DIAGNOSIS — I10 Essential (primary) hypertension: Secondary | ICD-10-CM

## 2023-11-21 DIAGNOSIS — E782 Mixed hyperlipidemia: Secondary | ICD-10-CM | POA: Diagnosis not present

## 2023-11-21 DIAGNOSIS — Z95811 Presence of heart assist device: Secondary | ICD-10-CM

## 2023-11-21 DIAGNOSIS — E66812 Obesity, class 2: Secondary | ICD-10-CM

## 2023-11-21 DIAGNOSIS — Z6837 Body mass index (BMI) 37.0-37.9, adult: Secondary | ICD-10-CM

## 2023-11-22 LAB — CBC WITH DIFFERENTIAL/PLATELET
Basophils Absolute: 0.1 x10E3/uL (ref 0.0–0.2)
Basos: 1 %
EOS (ABSOLUTE): 0.1 x10E3/uL (ref 0.0–0.4)
Eos: 3 %
Hematocrit: 35.6 % (ref 34.0–46.6)
Hemoglobin: 11.5 g/dL (ref 11.1–15.9)
Immature Grans (Abs): 0 x10E3/uL (ref 0.0–0.1)
Immature Granulocytes: 0 %
Lymphocytes Absolute: 2.2 x10E3/uL (ref 0.7–3.1)
Lymphs: 42 %
MCH: 33 pg (ref 26.6–33.0)
MCHC: 32.3 g/dL (ref 31.5–35.7)
MCV: 102 fL — ABNORMAL HIGH (ref 79–97)
Monocytes Absolute: 0.6 x10E3/uL (ref 0.1–0.9)
Monocytes: 12 %
Neutrophils Absolute: 2.2 x10E3/uL (ref 1.4–7.0)
Neutrophils: 42 %
Platelets: 232 x10E3/uL (ref 150–450)
RBC: 3.49 x10E6/uL — ABNORMAL LOW (ref 3.77–5.28)
RDW: 13.1 % (ref 11.7–15.4)
WBC: 5.1 x10E3/uL (ref 3.4–10.8)

## 2023-11-22 LAB — COMPREHENSIVE METABOLIC PANEL WITH GFR
ALT: 24 IU/L (ref 0–32)
AST: 36 IU/L (ref 0–40)
Albumin: 4.2 g/dL (ref 3.8–4.8)
Alkaline Phosphatase: 64 IU/L (ref 44–121)
BUN/Creatinine Ratio: 14 (ref 12–28)
BUN: 14 mg/dL (ref 8–27)
Bilirubin Total: 0.4 mg/dL (ref 0.0–1.2)
CO2: 26 mmol/L (ref 20–29)
Calcium: 9.8 mg/dL (ref 8.7–10.3)
Chloride: 96 mmol/L (ref 96–106)
Creatinine, Ser: 0.98 mg/dL (ref 0.57–1.00)
Globulin, Total: 2.8 g/dL (ref 1.5–4.5)
Glucose: 132 mg/dL — ABNORMAL HIGH (ref 70–99)
Potassium: 4 mmol/L (ref 3.5–5.2)
Sodium: 138 mmol/L (ref 134–144)
Total Protein: 7 g/dL (ref 6.0–8.5)
eGFR: 61 mL/min/1.73 (ref >59)

## 2023-11-22 LAB — HEMOGLOBIN A1C
Est. average glucose Bld gHb Est-mCnc: 154 mg/dL
Hgb A1c MFr Bld: 7 % — ABNORMAL HIGH (ref 4.8–5.6)

## 2023-11-22 LAB — LIPID PANEL
Chol/HDL Ratio: 3.2 ratio (ref 0.0–4.4)
Cholesterol, Total: 124 mg/dL (ref 100–199)
HDL: 39 mg/dL — ABNORMAL LOW (ref >39)
LDL Chol Calc (NIH): 63 mg/dL (ref 0–99)
Triglycerides: 125 mg/dL (ref 0–149)
VLDL Cholesterol Cal: 22 mg/dL (ref 5–40)

## 2023-11-22 LAB — HCV INTERPRETATION

## 2023-11-22 LAB — HCV AB W REFLEX TO QUANT PCR: HCV Ab: NONREACTIVE

## 2023-11-23 ENCOUNTER — Ambulatory Visit: Payer: Self-pay | Admitting: Family Medicine

## 2023-11-23 DIAGNOSIS — Z1159 Encounter for screening for other viral diseases: Secondary | ICD-10-CM

## 2023-11-23 DIAGNOSIS — I480 Paroxysmal atrial fibrillation: Secondary | ICD-10-CM | POA: Insufficient documentation

## 2023-11-23 HISTORY — DX: Paroxysmal atrial fibrillation: I48.0

## 2023-11-23 HISTORY — DX: Encounter for screening for other viral diseases: Z11.59

## 2023-11-23 NOTE — Assessment & Plan Note (Signed)
 Well-controlled on current regimen with pravastatin  40 mg once daily.

## 2023-11-23 NOTE — Assessment & Plan Note (Signed)
 uses cpap. Compliant and she feels like it helps.

## 2023-11-23 NOTE — Assessment & Plan Note (Signed)
 S/p aortic valve replacement.  Requires dental antibiotic prophylaxis

## 2023-11-23 NOTE — Assessment & Plan Note (Signed)
Recommend continue to work on eating healthy diet and exercise. Comorbidities: diabetes, hypertension.  

## 2023-11-23 NOTE — Assessment & Plan Note (Signed)
 Well controlled.  No changes to medicines. Chlorthalidone  25 mg once daily, Valsartan  320 mg daily Continue to work on eating a healthy diet and exercise.  Labs drawn today.

## 2023-11-23 NOTE — Assessment & Plan Note (Signed)
No recurrences.

## 2023-11-23 NOTE — Assessment & Plan Note (Signed)
 The current medical regimen is effective;  continue present plan and medications.  Continue famotidine  and omeprazole  once daily.

## 2023-11-23 NOTE — Assessment & Plan Note (Addendum)
 S/p aortic valve replacement.  Requires dental antibiotic prophylaxis

## 2023-11-23 NOTE — Assessment & Plan Note (Signed)
 Well controlled.  On Actos  due to intolerance to multiple other diabetes medications. -Continue current management and monitor blood glucose levels. Recommend check feet daily. Recommend annual eye exams. Continue to work on eating a healthy diet and exercise.  Labs drawn today.

## 2023-11-23 NOTE — Progress Notes (Deleted)
 duplicate

## 2023-12-04 ENCOUNTER — Other Ambulatory Visit: Payer: Self-pay | Admitting: Family Medicine

## 2023-12-04 ENCOUNTER — Ambulatory Visit

## 2023-12-04 DIAGNOSIS — E782 Mixed hyperlipidemia: Secondary | ICD-10-CM | POA: Diagnosis not present

## 2023-12-04 DIAGNOSIS — Z953 Presence of xenogenic heart valve: Secondary | ICD-10-CM

## 2023-12-04 DIAGNOSIS — I5032 Chronic diastolic (congestive) heart failure: Secondary | ICD-10-CM | POA: Diagnosis not present

## 2023-12-04 DIAGNOSIS — I10 Essential (primary) hypertension: Secondary | ICD-10-CM | POA: Diagnosis not present

## 2023-12-04 LAB — ECHOCARDIOGRAM COMPLETE
AR max vel: 1.03 cm2
AV Area VTI: 1.26 cm2
AV Area mean vel: 1.17 cm2
AV Mean grad: 15 mmHg
AV Peak grad: 31.4 mmHg
Ao pk vel: 2.8 m/s
Area-P 1/2: 2.45 cm2
MV VTI: 1.42 cm2
S' Lateral: 2 cm

## 2023-12-04 MED ORDER — HYDROCORTISONE ACETATE 25 MG RE SUPP: 25.0000 mg | Freq: Two times a day (BID) | RECTAL | 2 refills | Status: AC

## 2023-12-04 NOTE — Telephone Encounter (Signed)
 Copied from CRM #[id number]. Topic: Clinical - Medication Refill >> Dec 04, 2023  3:46 PM Carlatta H wrote: Medication: Anucort-HC  25mg   Has the patient contacted their pharmacy? No (Agent: If no, request that the patient contact the pharmacy for the refill. If patient does not wish to contact the pharmacy document the reason why and proceed with request.) (Agent: If yes, when and what did the pharmacy advise?)  This is the patient's preferred pharmacy:  [id number], [place] - [address] [place] [address] Phone: [phone] Fax: [phone]     Is this the correct pharmacy for this prescription? Yes If no, delete pharmacy and type the correct one.   Has the prescription been filled recently? No  Is the patient out of the medication? Yes  Has the patient been seen for an appointment in the last year OR does the patient have an upcoming appointment? Yes  Can we respond through MyChart? No  Agent: Please be advised that Rx refills may take up to 3 business days. We ask that you follow-up with your pharmacy.

## 2023-12-05 ENCOUNTER — Ambulatory Visit: Payer: Self-pay

## 2023-12-15 ENCOUNTER — Other Ambulatory Visit: Payer: Self-pay | Admitting: Family Medicine

## 2023-12-17 ENCOUNTER — Ambulatory Visit: Payer: Self-pay

## 2023-12-17 NOTE — Telephone Encounter (Signed)
 FYI Only or Action Required?: FYI only for provider.  Patient was last seen in primary care on 11/21/2023 by Sherre Clapper, MD.  Called Nurse Triage reporting Leg Pain.  Symptoms began several days ago.  Interventions attempted: Nothing.  Symptoms are: gradually worsening.  Triage Disposition: See PCP When Office is Open (Within 3 Days)  Patient/caregiver understands and will follow disposition?: Yes       Copied from CRM #[id number]. Topic: Clinical - Red Word Triage >> Dec 17, 2023  9:33 AM Pinkey ORN wrote: Red Word that prompted transfer to Nurse Triage: Severe Pain & Water Retention >> Dec 17, 2023  9:34 AM Pinkey ORN wrote: Patient states she's been experiencing some water retention, rash and severe pain in both legs since last week.      Reason for Disposition  [1] MODERATE pain (e.g., interferes with normal activities, limping) AND [2] present > 3 days  Answer Assessment - Initial Assessment Questions 1. ONSET: When did the pain start?      Several days  2. LOCATION: Where is the pain located?      Bilateral legs  3. PAIN: How bad is the pain?    (Scale 1-10; or mild, moderate, severe)     6/10 4. WORK OR EXERCISE: Has there been any recent work or exercise that involved this part of the body?      No 5. CAUSE: What do you think is causing the leg pain?     Unsure  6. OTHER SYMPTOMS: Do you have any other symptoms? (e.g., chest pain, back pain, breathing difficulty, swelling, rash, fever, numbness, weakness)     Red spots to legs  Protocols used: Leg Pain-A-AH

## 2023-12-18 NOTE — Progress Notes (Unsigned)
 Subjective:  Patient ID: Megan Galvan, female    DOB: 09-May-1950  Age: 73 y.o. MRN: [id number]  No chief complaint on file.   HPI:  Diabetes:  Complications: diabetic polyneuropathy. Glucose checking:  daily  Glucose logs: Blood sugar range 130-140s  Hypoglycemia:none Most recent A1C: 7.0% Current medications: Actos  30 mg daily.   Last Eye Exam: 10/82024.   Foot checks: daily. Diet: Eating more fruit. Avoiding carbs/sweets.  Exercise: none.   Hyperlipidemia: Current medications:  Pravastatin  40 mg daily    Hypertension: Current medications:  Chlorthalidone  25 mg daily, Valsartan  320 mg daily Cardiologist prescribed furosemide  20 mg to be taken three times a week   GERD: well controlled on famotidine  and omeprazole  once daily.    OSA: uses cpap. Compliant and she feels like it helps. Takes trazodone  for insomnia, which is effective despite recent stress   Aortic valve replacement. Had postop atrial fibrillation managed on amiodarone . No recurrences. Recent visit with Dr. Liborio. History of diastolic CONGESTIVE HEART FAILURE. Started on lasix  20 mg three times a week    Musculoskeletal pain includes Intermittent back, shoulder, and leg pain. Uses tylenol  and occasionally uses an oxycodone .  - History of cervical and lumbar surgery.      11/21/2023   10:08 AM 09/25/2023    2:10 PM 09/08/2023   11:17 AM 05/20/2023    9:55 AM 10/16/2022    9:58 AM  Depression screen PHQ 2/9  Decreased Interest 0 0 0 1 0  Down, Depressed, Hopeless 1 0 0 1 0  PHQ - 2 Score 1 0 0 2 0  Altered sleeping 1   1 0  Tired, decreased energy 1   1 1   Change in appetite 0   0 0  Feeling bad or failure about yourself  0   0 0  Trouble concentrating 0   0 0  Moving slowly or fidgety/restless 1   0 0  Suicidal thoughts 0   0 0  PHQ-9 Score 4   4 1   Difficult doing work/chores Somewhat difficult   Somewhat difficult Not difficult at all        09/25/2023    2:11 PM  Fall Risk   Falls in the  past year? 0  Number falls in past yr: 0  Injury with Fall? 0  Risk for fall due to : No Fall Risks  Follow up Falls prevention discussed;Education provided;Falls evaluation completed    Patient Care Team: Sherre Clapper, MD as PCP - General (Family Medicine) Nyle Rankin POUR, [facility] (Inactive) as Pharmacist (Pharmacist) Charlanne Groom, MD as Consulting Physician (Gastroenterology) Monetta Redell PARAS, MD as Consulting Physician (Cardiology) Vannie Elsie HERO, OD (Ophthalmology) Joshua Alm Hamilton, MD as Consulting Physician (Neurosurgery)   Review of Systems  Constitutional:  Negative for chills, fatigue and fever.  HENT:  Negative for congestion, ear pain, rhinorrhea and sore throat.   Respiratory:  Negative for cough and shortness of breath.   Cardiovascular:  Negative for chest pain.  Gastrointestinal:  Negative for abdominal pain, constipation, diarrhea, nausea and vomiting.  Genitourinary:  Negative for dysuria and urgency.  Musculoskeletal:  Negative for back pain and myalgias.  Neurological:  Negative for dizziness, weakness, light-headedness and headaches.  Psychiatric/Behavioral:  Negative for dysphoric mood. The patient is not nervous/anxious.     Current Outpatient Medications on File Prior to Visit  Medication Sig Dispense Refill   ACCU-CHEK GUIDE TEST test strip USE 1 STRIP WITH METER DAILY IN  THE AFTERNOON 100  strip 2   Accu-Chek Softclix Lancets lancets CHECK BLOOD SUGAR DAILY 100 each 2   acetaminophen  (TYLENOL ) 500 MG tablet Take 1,000 mg by mouth 2 (two) times daily.     AMBULATORY NON FORMULARY MEDICATION Diltiazem  2% compounded with lidocaine  5% ointment applied to the rectum 3 times daily for 8 weeks. 1 each 0   AMBULATORY NON FORMULARY MEDICATION Nitroglycerine ointment 0.125 %. Apply a pea sized amount internally four times daily. 30 g 2   aspirin  EC 81 MG tablet Take 1 tablet (81 mg total) by mouth daily. Swallow whole. 90 tablet 3   Blood Glucose Monitoring Suppl  (ACCU-CHEK GUIDE ME) w/Device KIT Use as directed 1 kit 0   Calcium Carb-Cholecalciferol (CALCIUM 600 + D PO) Take 2 tablets by mouth daily.     cetirizine (ZYRTEC) 10 MG tablet Take 10 mg by mouth daily.     chlorthalidone  (HYGROTON ) 25 MG tablet Take 1 tablet (25 mg total) by mouth daily. 90 tablet 1   clotrimazole -betamethasone  (LOTRISONE ) cream Apply 1 Application topically daily. 45 g 3   famotidine  (PEPCID ) 40 MG tablet TAKE 1 TABLET BY MOUTH AT  BEDTIME 100 tablet 2   fluticasone  (FLONASE ) 50 MCG/ACT nasal spray Place 1 spray into the nose daily as needed for allergies.     furosemide  (LASIX ) 20 MG tablet Take 1 tablet (20 mg total) by mouth 3 (three) times a week. 45 tablet 3   hydrocortisone  (ANUSOL -HC) 25 MG suppository Place 1 suppository (25 mg total) rectally 2 (two) times daily. 30 suppository 2   Melatonin 5 MG CAPS Take 5 mg by mouth at bedtime.     Multiple Vitamin (MULTIVITAMIN WITH MINERALS) TABS tablet Take 1 tablet by mouth daily.     Multiple Vitamins-Minerals (HAIR SKIN & NAILS) TABS Take 1 tablet by mouth daily.     nystatin  ointment (MYCOSTATIN ) Apply 1 Application topically 2 (two) times daily as needed (yeast / itching). Mix with triamcinolone      omeprazole  (PRILOSEC) 40 MG capsule TAKE 1 CAPSULE BY MOUTH TWICE  DAILY BEFORE MEALS 200 capsule 2   pioglitazone  (ACTOS ) 30 MG tablet TAKE 1 TABLET BY MOUTH DAILY 100 tablet 2   potassium chloride  SA (KLOR-CON  M) 20 MEQ tablet TAKE 2 TABLETS BY MOUTH TWICE  DAILY 400 tablet 2   pravastatin  (PRAVACHOL ) 40 MG tablet TAKE 1 TABLET BY MOUTH AT  BEDTIME 100 tablet 2   PREMARIN  vaginal cream APPLY TWICE A week. 42.5 g 1   PRESCRIPTION MEDICATION Place 1 Application rectally daily as needed (Hemorrhoids). NIFEDIPINE 5% ointment     traZODone  (DESYREL ) 100 MG tablet TAKE 1 TABLET BY MOUTH BEFORE  BEDTIME 90 tablet 3   triamcinolone  ointment (KENALOG ) 0.1 % Apply 1 Application topically 2 (two) times daily as needed (yeast /  itching). Mix with nystatin      valsartan  (DIOVAN ) 320 MG tablet TAKE 1 TABLET BY MOUTH DAILY 100 tablet 2   No current facility-administered medications on file prior to visit.   Past Medical History:  Diagnosis Date   Allergic sinusitis 08/27/2022   Arthritis    Atrophic vaginitis 03/31/2021   Cataract    Class 1 obesity due to excess calories with serious comorbidity and body mass index (BMI) of 33.0 to 33.9 in adult 03/28/2022   Encounter for osteoporosis screening in asymptomatic postmenopausal patient 06/28/2022   External hemorrhoids 11/12/2021   Gastro-esophageal reflux disease without esophagitis 06/03/2019   Heart murmur    AVR   History of  kidney stones    Hypertension associated with diabetes (HCC) 10/01/2019   Mixed hyperlipidemia    Mucositis (ulcerative) of vagina and vulva 06/21/2022   Neck pain, chronic 10/18/2022   Primary insomnia 06/03/2019   S/P aortic valve replacement with bioprosthetic valve 08/10/2020   21 mm Edwards Resilia Inspiria Bioprosthetic Tissue Valve  SN [id number] Model 11500A   Sleep apnea    cpap   Type 2 diabetes mellitus with diabetic polyneuropathy, without long-term current use of insulin  (HCC) 06/03/2019   Vertigo 07/20/2019   Past Surgical History:  Procedure Laterality Date   ANTERIOR CERVICAL DECOMP/DISCECTOMY FUSION     x 2   ANTERIOR CERVICAL DECOMP/DISCECTOMY FUSION N/A 02/19/2023   Procedure: Anterior Cervical Discectomy Fusion-Cervical Three-Cervical Four, Removal Plate Cervical Four-Cervical Five;  Surgeon: Joshua Alm Hamilton, MD;  Location: [facility] OR;  Service: Neurosurgery;  Laterality: N/A;  3C   AORTIC VALVE REPLACEMENT N/A 08/10/2020   Procedure: AORTIC VALVE REPLACEMENT (AVR) USING INSPIRIS VALVE SIZE ;  Surgeon: Dusty Sudie DEL, MD;  Location: [facility] OR;  Service: Open Heart Surgery;  Laterality: N/A;   BACK SURGERY  11/17/2016   L3-L5   BILATERAL CARPAL TUNNEL RELEASE     bone spur Left    left shoulder   bone spur Right     Right shoulder   CATARACT EXTRACTION Left 09/18/2021   COLONOSCOPY  08/22/2011   Moderate predominantly sigmoid diverticulosis. Small internal hemorrhoids.    COLONOSCOPY     HEMORROIDECTOMY  02/2019   RIGHT/LEFT HEART CATH AND CORONARY ANGIOGRAPHY N/A 07/27/2020   Procedure: RIGHT/LEFT HEART CATH AND CORONARY ANGIOGRAPHY;  Surgeon: Wonda Sharper, MD;  Location[facility] INVASIVE CV LAB;  Service: Cardiovascular;  Laterality: N/A;   TEE WITHOUT CARDIOVERSION N/A 08/10/2020   Procedure: TRANSESOPHAGEAL ECHOCARDIOGRAM (TEE);  Surgeon: Dusty Sudie DEL, MD;  Location: [facility] OR;  Service: Open Heart Surgery;  Laterality: N/A;   torn rotator cuff Right    Right shoulder   torn rotator cuff Left    left shoulder   TRIGGER FINGER RELEASE Left    thumb   TRIGGER FINGER RELEASE Right    thumb and middle finger   UPPER GASTROINTESTINAL ENDOSCOPY     WRIST SURGERY Left    torn ligament   WRIST SURGERY Right    cyst    Family History  Problem Relation Age of Onset   Diabetes Mother    Heart Problems Mother    Colon cancer Brother 58   Heart Problems Maternal Grandfather    Colon cancer Paternal Grandmother    Esophageal cancer Neg Hx    Rectal cancer Neg Hx    Stomach cancer Neg Hx    Breast cancer Neg Hx    Social History   Socioeconomic History   Marital status: Married    Spouse name: Eddie   Number of children: 2   Years of education: Not on file   Highest education level: Not on file  Occupational History   Not on file  Tobacco Use   Smoking status: Never   Smokeless tobacco: Never  Vaping Use   Vaping status: Never Used  Substance and Sexual Activity   Alcohol use: Never   Drug use: Never   Sexual activity: Yes    Birth control/protection: Post-menopausal  Other Topics Concern   Not on file  Social History Narrative   One son lives in home, the other son lives about 3 miles away   Social Drivers of Corporate investment banker  Strain: Low Risk  (09/25/2023)    Overall Financial Resource Strain (CARDIA)    Difficulty of Paying Living Expenses: Not hard at all  Food Insecurity: No Food Insecurity (09/25/2023)   Hunger Vital Sign    Worried About Running Out of Food in the Last Year: Never true    Ran Out of Food in the Last Year: Never true  Transportation Needs: No Transportation Needs (09/25/2023)   PRAPARE - Administrator, Civil Service (Medical): No    Lack of Transportation (Non-Medical): No  Physical Activity: Inactive (09/25/2023)   Exercise Vital Sign    Days of Exercise per Week: 0 days    Minutes of Exercise per Session: 0 min  Stress: No Stress Concern Present (09/25/2023)   [employer] of Occupational Health - Occupational Stress Questionnaire    Feeling of Stress : Not at all  Social Connections: Moderately Integrated (09/25/2023)   Social Connection and Isolation Panel    Frequency of Communication with Friends and Family: More than three times a week    Frequency of Social Gatherings with Friends and Family: More than three times a week    Attends Religious Services: More than 4 times per year    Active Member of [employer] or Organizations: No    Attends Banker Meetings: Never    Marital Status: Married    Objective:  There were no vitals taken for this visit.     11/21/2023   10:06 AM 11/17/2023   12:59 PM 09/25/2023    1:12 PM  BP/Weight  Systolic BP 132 120 --  Diastolic BP 78 70 --  Wt. (Lbs) 241 223 224  BMI 37.75 kg/m2 34.93 kg/m2 35.08 kg/m2    Physical Exam  {Perform Simple Foot Exam  Perform Detailed exam:1} {Insert foot Exam (Optional):30965}   Lab Results  Component Value Date   WBC 5.1 11/21/2023   HGB 11.5 11/21/2023   HCT 35.6 11/21/2023   PLT 232 11/21/2023   GLUCOSE 132 (H) 11/21/2023   CHOL 124 11/21/2023   TRIG 125 11/21/2023   HDL 39 (L) 11/21/2023   LDLCALC 63 11/21/2023   ALT 24 11/21/2023   AST 36 11/21/2023   NA 138 11/21/2023   K 4.0 11/21/2023   CL 96  11/21/2023   CREATININE 0.98 11/21/2023   BUN 14 11/21/2023   CO2 26 11/21/2023   TSH 2.100 02/04/2023   INR 1.0 02/13/2023   HGBA1C 7.0 (H) 11/21/2023   MICROALBUR 10 12/13/2020      Assessment & Plan:  There are no diagnoses linked to this encounter.   No orders of the defined types were placed in this encounter.   No orders of the defined types were placed in this encounter.    Follow-up: No follow-ups on file.   I,Callee Rohrig A Berklee Battey,acting as a scribe for Abigail Free, MD.,have documented all relevant documentation on the behalf of Abigail Free, MD,as directed by  Abigail Free, MD while in the presence of Abigail Free, MD.   An After Visit Summary was printed and given to the patient.  Abigail Free, MD [facility] [phone]

## 2023-12-19 ENCOUNTER — Encounter: Payer: Self-pay | Admitting: Family Medicine

## 2023-12-19 ENCOUNTER — Ambulatory Visit: Admitting: Family Medicine

## 2023-12-19 VITALS — BP 130/76 | HR 83 | Temp 98.0°F | Ht 67.0 in | Wt 223.0 lb

## 2023-12-19 DIAGNOSIS — M791 Myalgia, unspecified site: Secondary | ICD-10-CM

## 2023-12-19 DIAGNOSIS — B372 Candidiasis of skin and nail: Secondary | ICD-10-CM

## 2023-12-19 DIAGNOSIS — I10 Essential (primary) hypertension: Secondary | ICD-10-CM

## 2023-12-19 DIAGNOSIS — E1142 Type 2 diabetes mellitus with diabetic polyneuropathy: Secondary | ICD-10-CM | POA: Diagnosis not present

## 2023-12-19 MED ORDER — PREGABALIN 75 MG PO CAPS: 75.0000 mg | ORAL_CAPSULE | Freq: Two times a day (BID) | ORAL | 2 refills | Status: DC

## 2023-12-19 MED ORDER — NYSTATIN 100000 UNIT/GM EX POWD: 1.0000 | Freq: Three times a day (TID) | CUTANEOUS | 3 refills | Status: DC

## 2023-12-19 NOTE — Patient Instructions (Signed)
 VISIT SUMMARY:  Today, you were seen for stinging pain and cramps in your lower legs, along with intermittent swelling and red spots on your feet. We discussed your pain management and reviewed your medication history.  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 The pain is sometimes severe enough to require oxycodone . -Start taking Lyrica  75 mg twice daily. -We will check your magnesium  level and a chemistry panel. -For cramps, you can try yellow mustard, apple cider vinegar with water, tonic water, or Hyland's cramps.  HYPERTENSION: Your blood pressure was elevated during a recent colonoscopy, but it was managed for the procedure. -Continue monitoring your blood pressure regularly.  RECURRENT INTERTRIGO: You have recurrent yeast infections in your skin folds. -A prescription for antifungal cream has been provided.

## 2023-12-19 NOTE — Progress Notes (Signed)
 Acute Office Visit  Subjective:    Patient ID: Megan Galvan, female    DOB: 1950-06-16, 73 y.o.   MRN: [id number]  Chief Complaint  Patient presents with   Leg Pain    HPI: Patient is in today for shooting pains in BL lower legs, painful to walk at times, compression stocking are too tight, noticed a mild rash and redness which has resolved. Pain comes and goes x 4 days.  Discussed the use of AI scribe software for clinical note transcription with the patient, who gave verbal consent to proceed.  History of Present Illness   Megan Galvan is a 73 year old female who presents with stinging pain and cramps in her lower legs.  Lower extremity neuropathic pain and cramps - Burning and stinging sensation in both lower legs, worsened by pressure - Pain severe enough to disturb sleep - Cramps in toes, especially when putting on socks - Yellow mustard provides relief for cramps - No joint pain in the ankle or knee joints  Lower extremity edema and cutaneous findings - Intermittent swelling of the feet - Red spots on the legs without associated pruritus - Alcohol rubs on red spots provide slight relief  Pain management and medication history - Tylenol  taken twice daily for pain - Oxycodone  used occasionally for severe pain - Previous use of gabapentin  and Lyrica , with Lyrica  being more effective for symptoms - Uncertain when magnesium  levels were last checked       Past Medical History:  Diagnosis Date   Allergic sinusitis 08/27/2022   Arthritis    Atrophic vaginitis 03/31/2021   Cataract    Class 1 obesity due to excess calories with serious comorbidity and body mass index (BMI) of 33.0 to 33.9 in adult 03/28/2022   Encounter for osteoporosis screening in asymptomatic postmenopausal patient 06/28/2022   External hemorrhoids 11/12/2021   Gastro-esophageal reflux disease without esophagitis 06/03/2019   Heart murmur    AVR   History of kidney stones    Hypertension  associated with diabetes (HCC) 10/01/2019   Mixed hyperlipidemia    Mucositis (ulcerative) of vagina and vulva 06/21/2022   Neck pain, chronic 10/18/2022   Primary insomnia 06/03/2019   S/P aortic valve replacement with bioprosthetic valve 08/10/2020   21 mm Edwards Resilia Inspiria Bioprosthetic Tissue Valve  SN [id number] Model [id number]   Sleep apnea    cpap   Type 2 diabetes mellitus with diabetic polyneuropathy, without long-term current use of insulin  (HCC) 06/03/2019   Vertigo 07/20/2019    Past Surgical History:  Procedure Laterality Date   ANTERIOR CERVICAL DECOMP/DISCECTOMY FUSION     x 2   ANTERIOR CERVICAL DECOMP/DISCECTOMY FUSION N/A 02/19/2023   Procedure: Anterior Cervical Discectomy Fusion-Cervical Three-Cervical Four, Removal Plate Cervical Four-Cervical Five;  Surgeon: Joshua Alm Hamilton, MD;  Location: [facility] OR;  Service: Neurosurgery;  Laterality: N/A;  3C   AORTIC VALVE REPLACEMENT N/A 08/10/2020   Procedure: AORTIC VALVE REPLACEMENT (AVR) USING INSPIRIS VALVE SIZE ;  Surgeon: Dusty Sudie DEL, MD;  Location: [facility] OR;  Service: Open Heart Surgery;  Laterality: N/A;   BACK SURGERY  11/17/2016   L3-L5   BILATERAL CARPAL TUNNEL RELEASE     bone spur Left    left shoulder   bone spur Right    Right shoulder   CATARACT EXTRACTION Left 09/18/2021   COLONOSCOPY  08/22/2011   Moderate predominantly sigmoid diverticulosis. Small internal hemorrhoids.    COLONOSCOPY     HEMORROIDECTOMY  02/2019  RIGHT/LEFT HEART CATH AND CORONARY ANGIOGRAPHY N/A 07/27/2020   Procedure: RIGHT/LEFT HEART CATH AND CORONARY ANGIOGRAPHY;  Surgeon: Wonda Sharper, MD;  Location: [facility] INVASIVE CV LAB;  Service: Cardiovascular;  Laterality: N/A;   TEE WITHOUT CARDIOVERSION N/A 08/10/2020   Procedure: TRANSESOPHAGEAL ECHOCARDIOGRAM (TEE);  Surgeon: Dusty Sudie DEL, MD;  Location: [facility] OR;  Service: Open Heart Surgery;  Laterality: N/A;   torn rotator cuff Right    Right shoulder   torn rotator cuff  Left    left shoulder   TRIGGER FINGER RELEASE Left    thumb   TRIGGER FINGER RELEASE Right    thumb and middle finger   UPPER GASTROINTESTINAL ENDOSCOPY     WRIST SURGERY Left    torn ligament   WRIST SURGERY Right    cyst    Family History  Problem Relation Age of Onset   Diabetes Mother    Heart Problems Mother    Colon cancer Brother 39   Heart Problems Maternal Grandfather    Colon cancer Paternal Grandmother    Esophageal cancer Neg Hx    Rectal cancer Neg Hx    Stomach cancer Neg Hx    Breast cancer Neg Hx     Social History   Socioeconomic History   Marital status: Married    Spouse name: Eddie   Number of children: 2   Years of education: Not on file   Highest education level: Not on file  Occupational History   Not on file  Tobacco Use   Smoking status: Never   Smokeless tobacco: Never  Vaping Use   Vaping status: Never Used  Substance and Sexual Activity   Alcohol use: Never   Drug use: Never   Sexual activity: Yes    Birth control/protection: Post-menopausal  Other Topics Concern   Not on file  Social History Narrative   One son lives in home, the other son lives about 3 miles away   Social Drivers of Health   Financial Resource Strain: Low Risk  (09/25/2023)   Overall Financial Resource Strain (CARDIA)    Difficulty of Paying Living Expenses: Not hard at all  Food Insecurity: No Food Insecurity (09/25/2023)   Hunger Vital Sign    Worried About Running Out of Food in the Last Year: Never true    Ran Out of Food in the Last Year: Never true  Transportation Needs: No Transportation Needs (09/25/2023)   PRAPARE - Administrator, Civil Service (Medical): No    Lack of Transportation (Non-Medical): No  Physical Activity: Inactive (09/25/2023)   Exercise Vital Sign    Days of Exercise per Week: 0 days    Minutes of Exercise per Session: 0 min  Stress: No Stress Concern Present (09/25/2023)   [employer] of Occupational Health -  Occupational Stress Questionnaire    Feeling of Stress : Not at all  Social Connections: Moderately Integrated (09/25/2023)   Social Connection and Isolation Panel    Frequency of Communication with Friends and Family: More than three times a week    Frequency of Social Gatherings with Friends and Family: More than three times a week    Attends Religious Services: More than 4 times per year    Active Member of [employer] or Organizations: No    Attends Banker Meetings: Never    Marital Status: Married  Catering manager Violence: Not At Risk (09/25/2023)   Humiliation, Afraid, Rape, and Kick questionnaire    Fear of Current or  Ex-Partner: No    Emotionally Abused: No    Physically Abused: No    Sexually Abused: No    Outpatient Medications Prior to Visit  Medication Sig Dispense Refill   ACCU-CHEK GUIDE TEST test strip USE 1 STRIP WITH METER DAILY IN  THE AFTERNOON 100 strip 2   Accu-Chek Softclix Lancets lancets CHECK BLOOD SUGAR DAILY 100 each 2   acetaminophen  (TYLENOL ) 500 MG tablet Take 1,000 mg by mouth 2 (two) times daily.     AMBULATORY NON FORMULARY MEDICATION Diltiazem  2% compounded with lidocaine  5% ointment applied to the rectum 3 times daily for 8 weeks. 1 each 0   AMBULATORY NON FORMULARY MEDICATION Nitroglycerine ointment 0.125 %. Apply a pea sized amount internally four times daily. 30 g 2   aspirin  EC 81 MG tablet Take 1 tablet (81 mg total) by mouth daily. Swallow whole. 90 tablet 3   Blood Glucose Monitoring Suppl (ACCU-CHEK GUIDE ME) w/Device KIT Use as directed 1 kit 0   Calcium Carb-Cholecalciferol (CALCIUM 600 + D PO) Take 2 tablets by mouth daily.     cetirizine (ZYRTEC) 10 MG tablet Take 10 mg by mouth daily.     chlorthalidone  (HYGROTON ) 25 MG tablet Take 1 tablet (25 mg total) by mouth daily. 90 tablet 1   clotrimazole -betamethasone  (LOTRISONE ) cream Apply 1 Application topically daily. 45 g 3   famotidine  (PEPCID ) 40 MG tablet TAKE 1 TABLET BY MOUTH AT   BEDTIME 100 tablet 2   fluticasone  (FLONASE ) 50 MCG/ACT nasal spray Place 1 spray into the nose daily as needed for allergies.     furosemide  (LASIX ) 20 MG tablet Take 1 tablet (20 mg total) by mouth 3 (three) times a week. 45 tablet 3   hydrocortisone  (ANUSOL -HC) 25 MG suppository Place 1 suppository (25 mg total) rectally 2 (two) times daily. 30 suppository 2   Melatonin 5 MG CAPS Take 5 mg by mouth at bedtime.     Multiple Vitamin (MULTIVITAMIN WITH MINERALS) TABS tablet Take 1 tablet by mouth daily.     Multiple Vitamins-Minerals (HAIR SKIN & NAILS) TABS Take 1 tablet by mouth daily.     nystatin  ointment (MYCOSTATIN ) Apply 1 Application topically 2 (two) times daily as needed (yeast / itching). Mix with triamcinolone      omeprazole  (PRILOSEC) 40 MG capsule TAKE 1 CAPSULE BY MOUTH TWICE  DAILY BEFORE MEALS 200 capsule 2   pioglitazone  (ACTOS ) 30 MG tablet TAKE 1 TABLET BY MOUTH DAILY 100 tablet 2   potassium chloride  SA (KLOR-CON  M) 20 MEQ tablet TAKE 2 TABLETS BY MOUTH TWICE  DAILY 400 tablet 2   pravastatin  (PRAVACHOL ) 40 MG tablet TAKE 1 TABLET BY MOUTH AT  BEDTIME 100 tablet 2   PREMARIN  vaginal cream APPLY TWICE A week. 42.5 g 1   PRESCRIPTION MEDICATION Place 1 Application rectally daily as needed (Hemorrhoids). NIFEDIPINE 5% ointment     traZODone  (DESYREL ) 100 MG tablet TAKE 1 TABLET BY MOUTH BEFORE  BEDTIME 90 tablet 3   triamcinolone  ointment (KENALOG ) 0.1 % Apply 1 Application topically 2 (two) times daily as needed (yeast / itching). Mix with nystatin      valsartan  (DIOVAN ) 320 MG tablet TAKE 1 TABLET BY MOUTH DAILY 100 tablet 2   No facility-administered medications prior to visit.    Allergies  Allergen Reactions   Ace Inhibitors Cough   Doreen [Dapagliflozin] Other (See Comments)    Yeast infections    Jardiance [Empagliflozin] Other (See Comments)    Yeast infections  Keflex [Cephalexin] Itching   Latex Other (See Comments)    Burn and itch   Doxycycline Rash    Metformin And Related Diarrhea   Nexium [Esomeprazole Magnesium ] Diarrhea   Protonix  [Pantoprazole  Sodium] Other (See Comments)    Constipation    Review of Systems  Constitutional:  Negative for chills, fatigue and fever.  HENT:  Negative for congestion, ear pain, rhinorrhea and sore throat.   Respiratory:  Negative for cough and shortness of breath.   Cardiovascular:  Negative for chest pain.  Gastrointestinal:  Negative for abdominal pain, constipation, diarrhea, nausea and vomiting.  Genitourinary:  Negative for dysuria and urgency.  Musculoskeletal:  Negative for back pain and myalgias.  Neurological:  Negative for dizziness, weakness, light-headedness and headaches.  Psychiatric/Behavioral:  Negative for dysphoric mood. The patient is not nervous/anxious.        Objective:        12/19/2023   10:43 AM 11/21/2023   10:06 AM 11/17/2023   12:59 PM  Vitals with BMI  Height 5' 7 5' 7 5' 7  Weight 223 lbs 241 lbs 223 lbs  BMI 34.92 37.74 34.92  Systolic 130 132 879  Diastolic 76 78 70  Pulse 83 83 67    No data found.   Physical Exam Vitals reviewed.  Constitutional:      Appearance: Normal appearance. She is normal weight.  Cardiovascular:     Rate and Rhythm: Normal rate and regular rhythm.     Pulses: Normal pulses.     Heart sounds: Normal heart sounds.  Pulmonary:     Effort: Pulmonary effort is normal. No respiratory distress.     Breath sounds: Normal breath sounds.  Musculoskeletal:        General: No swelling or tenderness.  Skin:    Findings: No rash.  Neurological:     Mental Status: She is alert and oriented to person, place, and time.  Psychiatric:        Mood and Affect: Mood normal.        Behavior: Behavior normal.     Health Maintenance Due  Topic Date Due   Zoster Vaccines- Shingrix (1 of 2) 05/28/2001   COVID-19 Vaccine (8 - 2024-25 season) 08/02/2023    There are no preventive care reminders to display for this patient.   Lab  Results  Component Value Date   TSH 2.100 02/04/2023   Lab Results  Component Value Date   WBC 5.1 11/21/2023   HGB 11.5 11/21/2023   HCT 35.6 11/21/2023   MCV 102 (H) 11/21/2023   PLT 232 11/21/2023   Lab Results  Component Value Date   NA 139 12/19/2023   K 4.0 12/19/2023   CO2 24 12/19/2023   GLUCOSE 112 (H) 12/19/2023   BUN 15 12/19/2023   CREATININE 0.97 12/19/2023   BILITOT 0.3 12/19/2023   ALKPHOS 73 12/19/2023   AST 29 12/19/2023   ALT 23 12/19/2023   PROT 6.7 12/19/2023   ALBUMIN  4.2 12/19/2023   CALCIUM 9.4 12/19/2023   ANIONGAP 10 08/13/2020   EGFR 62 12/19/2023   Lab Results  Component Value Date   CHOL 124 11/21/2023   Lab Results  Component Value Date   HDL 39 (L) 11/21/2023   Lab Results  Component Value Date   LDLCALC 63 11/21/2023   Lab Results  Component Value Date   TRIG 125 11/21/2023   Lab Results  Component Value Date   CHOLHDL 3.2 11/21/2023   Lab Results  Component Value Date   HGBA1C 7.0 (H) 11/21/2023       Assessment & Plan:  Type 2 diabetes mellitus with diabetic polyneuropathy, without long-term current use of insulin  (HCC) Assessment & Plan: Chronic stinging and burning pain in your lower legs, with cramps in your toes.  The pain is sometimes severe enough to require oxycodone . -Start taking Lyrica  75 mg twice daily. -We will check your magnesium  level and a chemistry panel. -For cramps, you can try yellow mustard, apple cider vinegar with water, tonic water, or Hyland's cramps.    Orders: -     Pregabalin ; Take 1 capsule (75 mg total) by mouth 2 (two) times daily.  Dispense: 60 capsule; Refill: 2  Myalgia Assessment & Plan: Check labs  Orders: -     Pregabalin ; Take 1 capsule (75 mg total) by mouth 2 (two) times daily.  Dispense: 60 capsule; Refill: 2 -     Comprehensive metabolic panel with GFR -     Magnesium  -     Phosphorus  Candidiasis, intertrigo Assessment & Plan: A Recurrent yeast infections in  your skin folds. -A prescription for antifungal cream has been provided.  Orders: -     Nystatin ; Apply 1 Application topically 3 (three) times daily.  Dispense: 60 g; Refill: 3  Essential hypertension Assessment & Plan: Well controlled.  No changes to medicines. Chlorthalidone  25 mg once daily, Valsartan  320 mg daily Continue to work on eating a healthy diet and exercise.  - Blood pressure was elevated during a recent colonoscopy, but it was managed for the procedure. -Continue monitoring your blood pressure regularly. - Labs drawn today.        Meds ordered this encounter  Medications   pregabalin  (LYRICA ) 75 MG capsule    Sig: Take 1 capsule (75 mg total) by mouth 2 (two) times daily.    Dispense:  60 capsule    Refill:  2   nystatin  (MYCOSTATIN /NYSTOP ) powder    Sig: Apply 1 Application topically 3 (three) times daily.    Dispense:  60 g    Refill:  3    Orders Placed This Encounter  Procedures   Comprehensive metabolic panel with GFR   Magnesium    Phosphorus     Follow-up: Return if symptoms worsen or fail to improve.  An After Visit Summary was printed and given to the patient.   LILLETTE Kato I Leal-Borjas,acting as a scribe for Abigail Free, MD.,have documented all relevant documentation on the behalf of Abigail Free, MD,as directed by  Abigail Free, MD while in the presence of Abigail Free, MD.   I attest that I have reviewed this visit and agree with the plan scribed by my staff.   Abigail Free, MD [facility] [phone]

## 2023-12-20 LAB — COMPREHENSIVE METABOLIC PANEL WITH GFR
ALT: 23 IU/L (ref 0–32)
AST: 29 IU/L (ref 0–40)
Albumin: 4.2 g/dL (ref 3.8–4.8)
Alkaline Phosphatase: 73 IU/L (ref 44–121)
BUN/Creatinine Ratio: 15 (ref 12–28)
BUN: 15 mg/dL (ref 8–27)
Bilirubin Total: 0.3 mg/dL (ref 0.0–1.2)
CO2: 24 mmol/L (ref 20–29)
Calcium: 9.4 mg/dL (ref 8.7–10.3)
Chloride: 98 mmol/L (ref 96–106)
Creatinine, Ser: 0.97 mg/dL (ref 0.57–1.00)
Globulin, Total: 2.5 g/dL (ref 1.5–4.5)
Glucose: 112 mg/dL — ABNORMAL HIGH (ref 70–99)
Potassium: 4 mmol/L (ref 3.5–5.2)
Sodium: 139 mmol/L (ref 134–144)
Total Protein: 6.7 g/dL (ref 6.0–8.5)
eGFR: 62 mL/min/1.73 (ref >59)

## 2023-12-20 LAB — PHOSPHORUS: Phosphorus: 3.3 mg/dL (ref 3.0–4.3)

## 2023-12-20 LAB — MAGNESIUM: Magnesium: 1.7 mg/dL (ref 1.6–2.3)

## 2023-12-21 ENCOUNTER — Ambulatory Visit: Payer: Self-pay | Admitting: Family Medicine

## 2023-12-21 DIAGNOSIS — B372 Candidiasis of skin and nail: Secondary | ICD-10-CM | POA: Insufficient documentation

## 2023-12-21 HISTORY — DX: Candidiasis of skin and nail: B37.2

## 2023-12-21 NOTE — Assessment & Plan Note (Signed)
 Chronic stinging and burning pain in your lower legs, with cramps in your toes.  The pain is sometimes severe enough to require oxycodone . -Start taking Lyrica  75 mg twice daily. -We will check your magnesium  level and a chemistry panel. -For cramps, you can try yellow mustard, apple cider vinegar with water, tonic water, or Hyland's cramps.

## 2023-12-21 NOTE — Assessment & Plan Note (Signed)
 A Recurrent yeast infections in your skin folds. -A prescription for antifungal cream has been provided.

## 2023-12-21 NOTE — Assessment & Plan Note (Signed)
 Check labs

## 2023-12-21 NOTE — Assessment & Plan Note (Signed)
 Well controlled.  No changes to medicines. Chlorthalidone  25 mg once daily, Valsartan  320 mg daily Continue to work on eating a healthy diet and exercise.  - Blood pressure was elevated during a recent colonoscopy, but it was managed for the procedure. -Continue monitoring your blood pressure regularly. - Labs drawn today.

## 2023-12-30 ENCOUNTER — Telehealth: Payer: Self-pay

## 2023-12-30 NOTE — Telephone Encounter (Signed)
 Unable to reach patient.

## 2023-12-30 NOTE — Telephone Encounter (Signed)
 Copied from CRM [id number]. Topic: Clinical - Prescription Issue >> Dec 30, 2023  9:37 AM Selinda RAMAN wrote: Reason for CRM: The patient called in stating the pregabalin  (LYRICA ) 75 MG capsule is not helping or working at all. Please assist patient further and if anything else can be called into her pharmacy she uses  [id number], [place] - [address]  Phone: [phone] Fax: [phone]

## 2023-12-31 ENCOUNTER — Other Ambulatory Visit: Payer: Self-pay | Admitting: Physician Assistant

## 2023-12-31 MED ORDER — PREGABALIN 150 MG PO CAPS
150.0000 mg | ORAL_CAPSULE | Freq: Three times a day (TID) | ORAL | 1 refills | Status: DC
Start: 2023-12-31 — End: 2024-02-06

## 2024-01-01 NOTE — Telephone Encounter (Signed)
 Spoke with patient, verbalized understanding and had no questions at this time. Rx is ready for pick up per patient.

## 2024-01-06 ENCOUNTER — Telehealth: Payer: Self-pay | Admitting: Family Medicine

## 2024-01-06 NOTE — Telephone Encounter (Signed)
 [facility] CPAP

## 2024-01-12 ENCOUNTER — Telehealth: Payer: Self-pay

## 2024-01-12 DIAGNOSIS — I5032 Chronic diastolic (congestive) heart failure: Secondary | ICD-10-CM

## 2024-01-12 NOTE — Telephone Encounter (Signed)
 Pt c/o swelling/edema: STAT if pt has developed SOB within 24 hours  If swelling, where is the swelling located? Swelling in both legs    How much weight have you gained and in what time span? 3-4 lbs   Have you gained 2 pounds in a day or 5 pounds in a week? Patient states probably gained 2lbs in a day.   Do you have a log of your daily weights (if so, list)? Doesn't have with her.   Are you currently taking a fluid pill? yes  Are you currently SOB? No   Have you traveled recently in a car or plane for an extended period of time? No Patient states she is getting up all night to use the bathroom.

## 2024-01-13 ENCOUNTER — Other Ambulatory Visit: Payer: Self-pay

## 2024-01-13 NOTE — Telephone Encounter (Signed)
 Called the patient and she reported that she was having swelling in her legs over the past few weeks it has been getting worse. Over the past few days her weight has increased from 222 lbs to 224 lbs. She reports that she has pain in her legs below the knee in her ankle and feet. Please advise.

## 2024-01-13 NOTE — Telephone Encounter (Signed)
 Left VM to return call

## 2024-01-14 ENCOUNTER — Other Ambulatory Visit: Payer: Self-pay

## 2024-01-14 DIAGNOSIS — H524 Presbyopia: Secondary | ICD-10-CM | POA: Diagnosis not present

## 2024-01-14 DIAGNOSIS — E119 Type 2 diabetes mellitus without complications: Secondary | ICD-10-CM | POA: Diagnosis not present

## 2024-01-14 DIAGNOSIS — Z961 Presence of intraocular lens: Secondary | ICD-10-CM | POA: Diagnosis not present

## 2024-01-14 LAB — HM DIABETES EYE EXAM

## 2024-01-14 MED ORDER — FUROSEMIDE 40 MG PO TABS: ORAL_TABLET | ORAL | 3 refills | Status: DC

## 2024-01-14 NOTE — Telephone Encounter (Signed)
 Recommendations reviewed with pt as per Dr. Madireddy's note.  Pt verbalized understanding and had no additional questions.

## 2024-01-14 NOTE — Telephone Encounter (Signed)
 Spoke with pt who states that her weight today remains 224 lbs. Pt reports pain in both lower legs from swelling but she also has pain in her right knee denies calf pain.

## 2024-01-15 DIAGNOSIS — M48062 Spinal stenosis, lumbar region with neurogenic claudication: Secondary | ICD-10-CM | POA: Diagnosis not present

## 2024-01-17 ENCOUNTER — Other Ambulatory Visit: Payer: Self-pay | Admitting: Family Medicine

## 2024-01-19 ENCOUNTER — Other Ambulatory Visit: Payer: Self-pay

## 2024-01-19 DIAGNOSIS — Z87442 Personal history of urinary calculi: Secondary | ICD-10-CM | POA: Insufficient documentation

## 2024-01-20 LAB — BASIC METABOLIC PANEL WITH GFR
BUN/Creatinine Ratio: 18 (ref 12–28)
BUN: 18 mg/dL (ref 8–27)
CO2: 27 mmol/L (ref 20–29)
Calcium: 9.4 mg/dL (ref 8.7–10.3)
Chloride: 94 mmol/L — ABNORMAL LOW (ref 96–106)
Creatinine, Ser: 1.01 mg/dL — ABNORMAL HIGH (ref 0.57–1.00)
Glucose: 125 mg/dL — ABNORMAL HIGH (ref 70–99)
Potassium: 3.9 mmol/L (ref 3.5–5.2)
Sodium: 137 mmol/L (ref 134–144)
eGFR: 59 mL/min/1.73 — ABNORMAL LOW (ref >59)

## 2024-01-20 LAB — PRO B NATRIURETIC PEPTIDE: NT-Pro BNP: 304 pg/mL — AB (ref 0–301)

## 2024-01-21 ENCOUNTER — Ambulatory Visit

## 2024-01-21 VITALS — BP 154/70 | HR 76 | Ht 66.0 in | Wt 218.0 lb

## 2024-01-21 DIAGNOSIS — I5032 Chronic diastolic (congestive) heart failure: Secondary | ICD-10-CM | POA: Diagnosis not present

## 2024-01-21 DIAGNOSIS — I2510 Atherosclerotic heart disease of native coronary artery without angina pectoris: Secondary | ICD-10-CM

## 2024-01-21 DIAGNOSIS — I25119 Atherosclerotic heart disease of native coronary artery with unspecified angina pectoris: Secondary | ICD-10-CM | POA: Diagnosis not present

## 2024-01-21 DIAGNOSIS — R079 Chest pain, unspecified: Secondary | ICD-10-CM

## 2024-01-21 HISTORY — DX: Atherosclerotic heart disease of native coronary artery without angina pectoris: I25.10

## 2024-01-21 NOTE — Patient Instructions (Addendum)
 Medication Instructions:  See Below     *If you need a refill on your cardiac medications before your next appointment, please call your pharmacy*   Lab Work: CBC- today  If you have labs (blood work) drawn today and your tests are completely normal, you will receive your results only by: MyChart Message (if you have MyChart) OR A paper copy in the mail If you have any lab test that is abnormal or we need to change your treatment, we will call you to review the results.   Testing/Procedures:  [facility] A DEPT OF . [facility] [place] [address] [place] [address] Dept: [phone] Loc: [phone]  Megan Galvan  01/21/2024  You are scheduled for a Cardiac Catheterization on Wednesday, October 1 with Dr. Gordy Galvan.  1. Please arrive at the [facility] (Main Entrance A) at [facility]: [address] [place], [place] [address] at 9:30 AM (This time is 2 hour(s) before your procedure to ensure your preparation).   Free valet parking service is available. You will check in at ADMITTING. The support person will be asked to wait in the waiting room.  It is OK to have someone drop you off and come back when you are ready to be discharged.    Special note: Every effort is made to have your procedure done on time. Please understand that emergencies sometimes delay scheduled procedures.  2. Diet: Nothing to eat after midnight.   3. Hydration: Nothing to eat and drink after midnight. (For TEE and Cath the same day)  4. Labs: CBC- today  5. Medication instructions in preparation for your procedure:   Contrast Allergy: No   Hold: Diabetic Medications: pioglitazone  (ACTOS ) 30 MG tablet - Day of Procedure  Stop taking, Diovan  (Valsartan ) Wednesday, October 1,  HOLD: Lasix - Day of Procedure     On the morning of your procedure, take your Aspirin  81 mg and any morning medicines NOT listed above.  You  may use sips of water.  6. Plan to go home the same day, you will only stay overnight if medically necessary. 7. Bring a current list of your medications and current insurance cards. 8. You MUST have a responsible person to drive you home. 9. Someone MUST be with you the first 24 hours after you arrive home or your discharge will be delayed. 10. Please wear clothes that are easy to get on and off and wear slip-on shoes.  Thank you for allowing us  to care for you!   -- [facility]    Follow-Up: At [facility], you and your health needs are our priority.  As part of our continuing mission to provide you with exceptional heart care, we have created designated Provider Care Teams.  These Care Teams include your primary Cardiologist (physician) and Advanced Practice Providers (APPs -  Physician Assistants and Nurse Practitioners) who all work together to provide you with the care you need, when you need it.  We recommend signing up for the patient portal called MyChart.  Sign up information is provided on this After Visit Summary.  MyChart is used to connect with patients for Virtual Visits (Telemedicine).  Patients are able to view lab/test results, encounter notes, upcoming appointments, etc.  Non-urgent messages can be sent to your provider as well.   To learn more about what you can do with MyChart, go to [email or web address].    Your next appointment:  4 week(s)  The format for your next appointment:   In Person  Provider:   [name], MD   Other Instructions  Coronary Angiogram With Stent Coronary angiogram with stent placement is a procedure to widen or open a narrow blood vessel of the heart (coronary artery). Arteries may become blocked by cholesterol buildup (plaques) in the lining of the artery wall. When a coronary artery becomes partially blocked, blood flow to that area decreases. This may lead to chest pain or a heart attack  (myocardial infarction). A stent is a small piece of metal that looks like mesh or spring. Stent placement may be done as treatment after a heart attack, or to prevent a heart attack if a blocked artery is found by a coronary angiogram. Let your health care provider know about: Any allergies you have, including allergies to medicines or contrast dye. All medicines you are taking, including vitamins, herbs, eye drops, creams, and over-the-counter medicines. Any problems you or family members have had with anesthetic medicines. Any blood disorders you have. Any surgeries you have had. Any medical conditions you have, including kidney problems or kidney failure. Whether you are pregnant or may be pregnant. Whether you are breastfeeding. What are the risks? Generally, this is a safe procedure. However, serious problems may occur, including: Damage to nearby structures or organs, such as the heart, blood vessels, or kidneys. A return of blockage. Bleeding, infection, or bruising at the insertion site. A collection of blood under the skin (hematoma) at the insertion site. A blood clot in another part of the body. Allergic reaction to medicines or dyes. Bleeding into the abdomen (retroperitoneal bleeding). Stroke (rare). Heart attack (rare). What happens before the procedure? Staying hydrated Follow instructions from your health care provider about hydration, which may include: Up to 2 hours before the procedure - you may continue to drink clear liquids, such as water, clear fruit juice, black coffee, and plain tea.    Eating and drinking restrictions Follow instructions from your health care provider about eating and drinking, which may include: 8 hours before the procedure - stop eating heavy meals or foods, such as meat, fried foods, or fatty foods. 6 hours before the procedure - stop eating light meals or foods, such as toast or cereal. 2 hours before the procedure - stop drinking clear  liquids. Medicines Ask your health care provider about: Changing or stopping your regular medicines. This is especially important if you are taking diabetes medicines or blood thinners. Taking medicines such as aspirin  and ibuprofen. These medicines can thin your blood. Do not take these medicines unless your health care provider tells you to take them. Generally, aspirin  is recommended before a thin tube, called a catheter, is passed through a blood vessel and inserted into the heart (cardiac catheterization). Taking over-the-counter medicines, vitamins, herbs, and supplements. General instructions Do not use any products that contain nicotine or tobacco for at least 4 weeks before the procedure. These products include cigarettes, e-cigarettes, and chewing tobacco. If you need help quitting, ask your health care provider. Plan to have someone take you home from the hospital or clinic. If you will be going home right after the procedure, plan to have someone with you for 24 hours. You may have tests and imaging procedures. Ask your health care provider: How your insertion site will be marked. Ask which artery will be used for the procedure. What steps will be taken to help prevent infection. These may include: Removing hair at the  insertion site. Washing skin with a germ-killing soap. Taking antibiotic medicine. What happens during the procedure? An IV will be inserted into one of your veins. Electrodes may be placed on your chest to monitor your heart rate during the procedure. You will be given one or more of the following: A medicine to help you relax (sedative). A medicine to numb the area (local anesthetic) for catheter insertion. A small incision will be made for catheter insertion. The catheter will be inserted into an artery using a guide wire. The location may be in your groin, your wrist, or the fold of your arm (near your elbow). An X-ray procedure (fluoroscopy) will be used to  help guide the catheter to the opening of the heart arteries. A dye will be injected into the catheter. X-rays will be taken. The dye helps to show where any narrowing or blockages are located in the arteries. Tell your health care provider if you have chest pain or trouble breathing. A tiny wire will be guided to the blocked spot, and a balloon will be inflated to make the artery wider. The stent will be expanded to crush the plaques into the wall of the vessel. The stent will hold the area open and improve the blood flow. Most stents have a drug coating to reduce the risk of the stent narrowing over time. The artery may be made wider using a drill, laser, or other tools that remove plaques. The catheter will be removed when the blood flow improves. The stent will stay where it was placed, and the lining of the artery will grow over it. A bandage (dressing) will be placed on the insertion site. Pressure will be applied to stop bleeding. The IV will be removed. This procedure may vary among health care providers and hospitals.    What happens after the procedure? Your blood pressure, heart rate, breathing rate, and blood oxygen level will be monitored until you leave the hospital or clinic. If the procedure is done through the leg, you will lie flat in bed for a few hours or for as long as told by your health care provider. You will be instructed not to bend or cross your legs. The insertion site and the pulse in your foot or wrist will be checked often. You may have more blood tests, X-rays, and a test that records the electrical activity of your heart (electrocardiogram, or ECG). Do not drive for 24 hours if you were given a sedative during your procedure. Summary Coronary angiogram with stent placement is a procedure to widen or open a narrowed coronary artery. This is done to treat heart problems. Before the procedure, let your health care provider know about all the medical conditions and  surgeries you have or have had. This is a safe procedure. However, some problems may occur, including damage to nearby structures or organs, bleeding, blood clots, or allergies. Follow your health care provider's instructions about eating, drinking, medicines, and other lifestyle changes, such as quitting tobacco use before the procedure. This information is not intended to replace advice given to you by your health care provider. Make sure you discuss any questions you have with your health care provider. Document Revised: 11/04/2018 Document Reviewed: 11/04/2018 Elsevier Patient Education  2021 [employer].

## 2024-01-21 NOTE — Assessment & Plan Note (Signed)
 Grade 1 diastolic dysfunction on echocardiogram from August 2025 LVEF 60 to 65%. Normal functioning aortic valve prosthesis.  Weight today 218 pounds. Down from 224 pounds at last visit.  Continue salt restriction to below 2 g/day and fluid restriction below 2 L/day. Continue furosemide  40 mg every other day.  Not on beta-blockers due to relatively slow heart rates at baseline. Continue valsartan  320 mg once daily. After CAD evaluation as reviewed above, will consider further titrating up medications with SGLT2 inhibitors.

## 2024-01-21 NOTE — Progress Notes (Addendum)
 Cardiology Consultation:    Date:  01/21/2024   ID:  Megan Galvan, DOB 07-Jun-1950, MRN [id number]  PCP:  Megan Clapper, MD  Cardiologist:  Megan SAUNDERS Takara Sermons, MD   Referring MD: Megan Clapper, MD   Chief Complaint  Patient presents with   Shortness of Breath     ASSESSMENT AND PLAN:   [name] 73 year old woman history of severe aortic stenosis s/p surgical aortic valve replacement [21 mm Edwards  Resilia Inspira bioprosthetic 08/10/2020], transient postop atrial fibrillation managed with amiodarone , minimal coronary atherosclerosis on cardiac cath March 2022 prior to valve surgery, chronic diastolic CHF, diabetes, hyperlipidemia, obstructive sleep apnea-uses CPAP, GERD, vertigo, s/p C-spine surgery October 2024.  Last echocardiogram completed 12/04/2023 LVEF 60 to 65%, grade 1 diastolic dysfunction, normal RV function, well-seated aortic valve prosthesis with peak velocity 2.8 m/s and mean gradient 15 mmHg.   Here for follow-up in the setting of worsening pedal edema and with chest pressure and heaviness which persisted despite resolution of pedal edema and decrease in weight. . Problem List Items Addressed This Visit     Chronic diastolic CHF (congestive heart failure), NYHA class 2 (HCC)   Grade 1 diastolic dysfunction on echocardiogram from August 2025 LVEF 60 to 65%. Normal functioning aortic valve prosthesis.  Weight today 218 pounds. Down from 224 pounds at last visit.  Continue salt restriction to below 2 g/day and fluid restriction below 2 L/day. Continue furosemide  40 mg every other day.  Not on beta-blockers due to relatively slow heart rates at baseline. Continue valsartan  320 mg once daily. After CAD evaluation as reviewed above, will consider further titrating up medications with SGLT2 inhibitors.       Coronary atherosclerosis - Primary   Minimal coronary atherosclerosis on cardiac cath March 2022 prior to her aortic valve surgery.  Now with symptoms of  chest pain progressive over the last few weeks.  Significant concern for anginal symptoms. Advised her to continue aspirin  81 mg once daily Continue pravastatin  40 mg once daily.  Further evaluation for ischemia recommended. Options discussed. Given her ongoing symptoms and prior history of valve surgery, stress test and cardiac CT coronary angiograms are not optimal. Discussed further evaluation with coronary angiogram by cardiac catheterization tentatively at [facility]. She is familiar with the procedure having underwent this previously.  Discussed the procedure at length. Shared Decision Making/Informed Consent{ The risks [stroke (1 in 1000), death (1 in 1000), kidney failure [usually temporary] (1 in 500), bleeding (1 in 200), allergic reaction [possibly serious] (1 in 200)], benefits (diagnostic support and management of coronary artery disease) and alternatives of a cardiac catheterization were discussed in detail with her and her husband and he is willing to proceed.  Agreeable to proceed with the procedure, tentatively schedule at [facility] at the earliest.  Advised her to head to the ER or call 911 if her symptoms are acutely worsening. If no significant obstructive CAD, would be reassuring, continue with heart failure management as below and recommend undergoing further evaluation for her dysphagia.       Other Visit Diagnoses       Chest pain of uncertain etiology       Relevant Orders   EKG 12-Lead (Completed)   CBC      Return to clinic tentatively in 4 weeks.   Addendum 01/27/2024: Prior authorization and peer to peer with insurance company Dr. Aloha Approved Information: [id number]  History of Present Illness:    Megan Galvan is  a 73 y.o. female who is being seen today for follow-up visit. PCP is Galvan, Abigail, MD. Last visit with me in the office was 11/17/2023.   history of severe aortic stenosis s/p surgical aortic valve  replacement [21 mm Edwards  Resilia Inspira bioprosthetic 08/10/2020], transient postop atrial fibrillation managed with amiodarone , minimal coronary atherosclerosis on cardiac cath March 2022 prior to valve surgery, chronic diastolic CHF, diabetes, hyperlipidemia, obstructive sleep apnea-uses CPAP, GERD, vertigo, s/p C-spine surgery October 2024.  Last echocardiogram completed 12/04/2023 LVEF 60 to 65%, grade 1 diastolic dysfunction, normal RV function, well-seated aortic valve prosthesis with peak velocity 2.8 m/s and mean gradient 15 mmHg.  Reached out to her office about a week ago for symptoms of bilateral lower extremity edema has been getting worse and weight went up to 224 pounds. Was advised to titrate up furosemide  dose to 40 mg once daily for 3 days and after that to take 40 mg once every other day.  Blood work from 01/19/2024 NT-proBNP mildly elevated 304. BUN 18, creatinine 1.01, eGFR 59, CKD stage II/III Sodium 137, potassium 3.9.  Last lipid panel 11/21/2023 with total cholesterol 124, triglycerides 125, HDL 39 and LDL 63.  Here for follow-up visit Weight at last office visit 223 pounds.  Mentions that she has been dealing with symptoms of shortness of breath and pedal edema for the last few weeks which has improved with the higher dose of Lasix .  She also has been dealing with symptoms of chest pain which she describes as a pain and pressure in the upper part of the chest which is present on and off but more consistent recently and worse with exertion.  She also describes discomfort in throat with swallowing which has been chronic since her spine surgeries.  She is pending further evaluation with imaging next week and follow-up with the surgeon.  Denies any nausea vomiting. Denies any palpitations. Denies any syncopal episodes. Denies any blood in urine or stools.  EKG in the clinic today shows sinus rhythm heart rate 57/min, PR interval 188 ms, QRS duration 66 ms, QTc 424  ms.   Past Medical History:  Diagnosis Date   Acute cystitis without hematuria 06/21/2022   Allergic sinusitis 08/27/2022   Atrophic vaginitis 03/31/2021   Candidiasis, intertrigo 12/21/2023   Cataract    Chronic diastolic CHF (congestive heart failure), NYHA class 2 (HCC) 11/17/2023   Class 1 obesity due to excess calories with serious comorbidity and body mass index (BMI) of 33.0 to 33.9 in adult 03/28/2022   Class 1 obesity due to excess calories with serious comorbidity and body mass index (BMI) of 34.0 to 34.9 in adult 03/28/2022   Class 2 severe obesity with serious comorbidity and body mass index (BMI) of 37.0 to 37.9 in adult 06/07/2019   Dysuria 04/07/2021   Encounter for osteoporosis screening in asymptomatic postmenopausal patient 06/28/2022   Essential hypertension 05/20/2023   External hemorrhoids 11/12/2021   Gastro-esophageal reflux disease without esophagitis 06/03/2019   History of kidney stones    Joint pain 05/20/2023   Mixed hyperlipidemia    Mucositis (ulcerative) of vagina and vulva 06/21/2022   Myalgia 06/07/2019   Neck pain, chronic 10/18/2022   Need for hepatitis C screening test 11/23/2023   Numbness in right leg 09/08/2023   Pain in both hands 02/08/2023   Paresthesias 09/04/2021   Paroxysmal atrial fibrillation (HCC) 11/23/2023   Primary insomnia 06/03/2019   Rectal burning 12/03/2022   Rectal itching 12/03/2022   Rectal pain 09/04/2021  S/P aortic valve replacement with bioprosthetic valve 08/10/2020   21 mm Edwards Resilia Inspiria Bioprosthetic Tissue Valve  SN [id number] Model 11500A   S/P cervical spinal fusion 02/19/2023   Sleep apnea    cpap   Type 2 diabetes mellitus with diabetic polyneuropathy, without long-term current use of insulin  (HCC) 06/03/2019   Venous insufficiency of both lower extremities 09/08/2023    Past Surgical History:  Procedure Laterality Date   ANTERIOR CERVICAL DECOMP/DISCECTOMY FUSION     x 2   ANTERIOR CERVICAL  DECOMP/DISCECTOMY FUSION N/A 02/19/2023   Procedure: Anterior Cervical Discectomy Fusion-Cervical Three-Cervical Four, Removal Plate Cervical Four-Cervical Five;  Surgeon: Joshua Alm Hamilton, MD;  Location: [facility];  Service: Neurosurgery;  Laterality: N/A;  3C   AORTIC VALVE REPLACEMENT N/A 08/10/2020   Procedure: AORTIC VALVE REPLACEMENT (AVR) USING INSPIRIS VALVE SIZE ;  Surgeon: Dusty Sudie DEL, MD;  Location: [facility] OR;  Service: Open Heart Surgery;  Laterality: N/A;   BACK SURGERY  11/17/2016   L3-L5   BILATERAL CARPAL TUNNEL RELEASE     bone spur Left    left shoulder   bone spur Right    Right shoulder   CATARACT EXTRACTION Left 09/18/2021   COLONOSCOPY  08/22/2011   Moderate predominantly sigmoid diverticulosis. Small internal hemorrhoids.    COLONOSCOPY     HEMORROIDECTOMY  02/2019   RIGHT/LEFT HEART CATH AND CORONARY ANGIOGRAPHY N/A 07/27/2020   Procedure: RIGHT/LEFT HEART CATH AND CORONARY ANGIOGRAPHY;  Surgeon: Wonda Sharper, MD;  Location[facility] INVASIVE CV LAB;  Service: Cardiovascular;  Laterality: N/A;   TEE WITHOUT CARDIOVERSION N/A 08/10/2020   Procedure: TRANSESOPHAGEAL ECHOCARDIOGRAM (TEE);  Surgeon: Dusty Sudie DEL, MD;  Location: [facility] OR;  Service: Open Heart Surgery;  Laterality: N/A;   torn rotator cuff Right    Right shoulder   torn rotator cuff Left    left shoulder   TRIGGER FINGER RELEASE Left    thumb   TRIGGER FINGER RELEASE Right    thumb and middle finger   UPPER GASTROINTESTINAL ENDOSCOPY     WRIST SURGERY Left    torn ligament   WRIST SURGERY Right    cyst    Current Medications: Current Meds  Medication Sig   ACCU-CHEK GUIDE TEST test strip USE 1 STRIP WITH METER DAILY IN  THE AFTERNOON   Accu-Chek Softclix Lancets lancets CHECK BLOOD SUGAR DAILY   acetaminophen  (TYLENOL ) 500 MG tablet Take 1,000 mg by mouth 2 (two) times daily.   AMBULATORY NON FORMULARY MEDICATION Diltiazem  2% compounded with lidocaine  5% ointment applied to the rectum 3  times daily for 8 weeks.   AMBULATORY NON FORMULARY MEDICATION Nitroglycerine ointment 0.125 %. Apply a pea sized amount internally four times daily.   aspirin  EC 81 MG tablet Take 1 tablet (81 mg total) by mouth daily. Swallow whole.   Blood Glucose Monitoring Suppl (ACCU-CHEK GUIDE ME) w/Device KIT Use as directed   Calcium Carb-Cholecalciferol (CALCIUM 600 + D PO) Take 2 tablets by mouth daily.   cetirizine (ZYRTEC) 10 MG tablet Take 10 mg by mouth daily.   chlorthalidone  (HYGROTON ) 25 MG tablet Take 1 tablet (25 mg total) by mouth daily.   clotrimazole -betamethasone  (LOTRISONE ) cream Apply 1 Application topically daily.   famotidine  (PEPCID ) 40 MG tablet TAKE 1 TABLET BY MOUTH AT  BEDTIME   fluticasone  (FLONASE ) 50 MCG/ACT nasal spray Place 1 spray into the nose daily as needed for allergies.   furosemide  (LASIX ) 40 MG tablet Take 40 mg (1 tablet) daily for  3 days then take 40 mg (1 tablet) every other day   hydrocortisone  (ANUSOL -HC) 25 MG suppository Place 1 suppository (25 mg total) rectally 2 (two) times daily.   Melatonin 5 MG CAPS Take 5 mg by mouth at bedtime.   Multiple Vitamin (MULTIVITAMIN WITH MINERALS) TABS tablet Take 1 tablet by mouth daily.   Multiple Vitamins-Minerals (HAIR SKIN & NAILS) TABS Take 1 tablet by mouth daily.   nystatin  (MYCOSTATIN /NYSTOP ) powder Apply 1 Application topically 3 (three) times daily.   nystatin  ointment (MYCOSTATIN ) Apply 1 Application topically 2 (two) times daily as needed (yeast / itching). Mix with triamcinolone    omeprazole  (PRILOSEC) 40 MG capsule TAKE 1 CAPSULE BY MOUTH TWICE  DAILY BEFORE MEALS   pioglitazone  (ACTOS ) 30 MG tablet TAKE 1 TABLET BY MOUTH DAILY   potassium chloride  SA (KLOR-CON  M) 20 MEQ tablet TAKE 2 TABLETS BY MOUTH TWICE  DAILY   pravastatin  (PRAVACHOL ) 40 MG tablet TAKE 1 TABLET BY MOUTH AT  BEDTIME   pregabalin  (LYRICA ) 150 MG capsule Take 1 capsule (150 mg total) by mouth 3 (three) times daily.   PREMARIN  vaginal cream  APPLY TWICE A week.   PRESCRIPTION MEDICATION Place 1 Application rectally daily as needed (Hemorrhoids). NIFEDIPINE 5% ointment   traZODone  (DESYREL ) 100 MG tablet TAKE 1 TABLET BY MOUTH BEFORE  BEDTIME   triamcinolone  ointment (KENALOG ) 0.1 % Apply 1 Application topically 2 (two) times daily as needed (yeast / itching). Mix with nystatin    valsartan  (DIOVAN ) 320 MG tablet TAKE 1 TABLET BY MOUTH DAILY     Allergies:   Ace inhibitors, Farxiga [dapagliflozin], Jardiance [empagliflozin], Keflex [cephalexin], Latex, Doxycycline, Metformin and related, Nexium [esomeprazole magnesium ], and Protonix  [pantoprazole  sodium]   Social History   Socioeconomic History   Marital status: Married    Spouse name: Eddie   Number of children: 2   Years of education: Not on file   Highest education level: Not on file  Occupational History   Not on file  Tobacco Use   Smoking status: Never   Smokeless tobacco: Never  Vaping Use   Vaping status: Never Used  Substance and Sexual Activity   Alcohol use: Never   Drug use: Never   Sexual activity: Yes    Birth control/protection: Post-menopausal  Other Topics Concern   Not on file  Social History Narrative   One son lives in home, the other son lives about 3 miles away   Social Drivers of Health   Financial Resource Strain: Low Risk  (09/25/2023)   Overall Financial Resource Strain (CARDIA)    Difficulty of Paying Living Expenses: Not hard at all  Food Insecurity: No Food Insecurity (09/25/2023)   Hunger Vital Sign    Worried About Running Out of Food in the Last Year: Never true    Ran Out of Food in the Last Year: Never true  Transportation Needs: No Transportation Needs (09/25/2023)   PRAPARE - Administrator, Civil Service (Medical): No    Lack of Transportation (Non-Medical): No  Physical Activity: Inactive (09/25/2023)   Exercise Vital Sign    Days of Exercise per Week: 0 days    Minutes of Exercise per Session: 0 min  Stress:  No Stress Concern Present (09/25/2023)   [employer] of Occupational Health - Occupational Stress Questionnaire    Feeling of Stress : Not at all  Social Connections: Moderately Integrated (09/25/2023)   Social Connection and Isolation Panel    Frequency of Communication with Friends and Family:  More than three times a week    Frequency of Social Gatherings with Friends and Family: More than three times a week    Attends Religious Services: More than 4 times per year    Active Member of [employer] or Organizations: No    Attends Engineer, structural: Never    Marital Status: Married     Family History: The patient's family history includes Colon cancer in her paternal grandmother; Colon cancer (age of onset: 71) in her brother; Diabetes in her mother; Heart Problems in her maternal grandfather and mother. There is no history of Esophageal cancer, Rectal cancer, Stomach cancer, or Breast cancer. ROS:   Please see the history of present illness.    All 14 point review of systems negative except as described per history of present illness.  EKGs/Labs/Other Studies Reviewed:    The following studies were reviewed today:   EKG:  EKG Interpretation Date/Time:  Wednesday January 21 2024 08:29:55 EDT Ventricular Rate:  57 PR Interval:  188 QRS Duration:  66 QT Interval:  436 QTC Calculation: 424 R Axis:   28  Text Interpretation: Sinus bradycardia Anterior infarct (cited on or before 08-Feb-2008) Abnormal ECG When compared with ECG of 13-Feb-2023 13:38, Criteria for Inferior infarct are no longer Present Confirmed by Liborio Hai reddy [phone]) on 01/21/2024 8:57:19 AM    Recent Labs: 02/04/2023: TSH 2.100 11/21/2023: Hemoglobin 11.5; Platelets 232 12/19/2023: ALT 23; Magnesium  1.7 01/19/2024: BUN 18; Creatinine, Ser 1.01; NT-Pro BNP 304; Potassium 3.9; Sodium 137  Recent Lipid Panel    Component Value Date/Time   CHOL 124 11/21/2023 1039   TRIG 125 11/21/2023 1039    HDL 39 (L) 11/21/2023 1039   CHOLHDL 3.2 11/21/2023 1039   LDLCALC 63 11/21/2023 1039    Physical Exam:    VS:  BP (!) 154/70 (BP Location: Left Arm, Patient Position: Sitting)   Pulse 76   Ht 5' 6 (1.676 m)   Wt 218 lb (98.9 kg)   SpO2 98%   BMI 35.19 kg/m     Wt Readings from Last 3 Encounters:  01/21/24 218 lb (98.9 kg)  12/19/23 223 lb (101.2 kg)  11/21/23 241 lb (109.3 kg)     GENERAL:  Well nourished, well developed in no acute distress NECK: No JVD; No carotid bruits CARDIAC: RRR, S1 and S2 present, 3/6 ejection systolic murmur best heard right upper sternal border. CHEST:  Clear to auscultation without rales, wheezing or rhonchi  Extremities: No pitting pedal edema. Pulses bilaterally symmetric with radial 2+ and dorsalis pedis 2+ NEUROLOGIC:  Alert and oriented x 3  Medication Adjustments/Labs and Tests Ordered: Current medicines are reviewed at length with the patient today.  Concerns regarding medicines are outlined above.  Orders Placed This Encounter  Procedures   CBC   EKG 12-Lead   No orders of the defined types were placed in this encounter.   Signed, Hai jess Liborio, MD, MPH, [facility]. 01/21/2024 9:40 AM    [facility]

## 2024-01-21 NOTE — Assessment & Plan Note (Signed)
 Minimal coronary atherosclerosis on cardiac cath March 2022 prior to her aortic valve surgery.  Now with symptoms of chest pain progressive over the last few weeks.  Significant concern for anginal symptoms. Advised her to continue aspirin  81 mg once daily Continue pravastatin  40 mg once daily.  Further evaluation for ischemia recommended. Options discussed. Given her ongoing symptoms and prior history of valve surgery, stress test and cardiac CT coronary angiograms are not optimal. Discussed further evaluation with coronary angiogram by cardiac catheterization tentatively at [facility]. She is familiar with the procedure having underwent this previously.  Discussed the procedure at length. Shared Decision Making/Informed Consent{ The risks [stroke (1 in 1000), death (1 in 1000), kidney failure [usually temporary] (1 in 500), bleeding (1 in 200), allergic reaction [possibly serious] (1 in 200)], benefits (diagnostic support and management of coronary artery disease) and alternatives of a cardiac catheterization were discussed in detail with her and her husband and he is willing to proceed.  Agreeable to proceed with the procedure, tentatively schedule at [facility] at the earliest.  Advised her to head to the ER or call 911 if her symptoms are acutely worsening. If no significant obstructive CAD, would be reassuring, continue with heart failure management as below and recommend undergoing further evaluation for her dysphagia.

## 2024-01-21 NOTE — H&P (View-Only) (Signed)
 Cardiology Consultation:    Date:  01/21/2024   ID:  Megan Galvan, DOB Dec 20, 1950, MRN [id number]  PCP:  Megan Clapper, MD  Cardiologist:  Megan Galvan Megan Weightman, MD   Referring MD: Megan Clapper, MD   Chief Complaint  Patient presents with   Shortness of Breath     ASSESSMENT AND PLAN:   Megan Galvan 73 year old woman history of severe aortic stenosis s/p surgical aortic valve replacement [21 mm Edwards  Resilia Inspira bioprosthetic 08/10/2020], transient postop atrial fibrillation managed with amiodarone , minimal coronary atherosclerosis on cardiac cath March 2022 prior to valve surgery, chronic diastolic CHF, diabetes, hyperlipidemia, obstructive sleep apnea-uses CPAP, GERD, vertigo, s/p C-spine surgery October 2024.  Last echocardiogram completed 12/04/2023 LVEF 60 to 65%, grade 1 diastolic dysfunction, normal RV function, well-seated aortic valve prosthesis with peak velocity 2.8 m/s and mean gradient 15 mmHg.   Here for follow-up in the setting of worsening pedal edema and with chest pressure and heaviness which persisted despite resolution of pedal edema and decrease in weight. . Problem List Items Addressed This Visit     Chronic diastolic CHF (congestive heart failure), NYHA class 2 (HCC)   Grade 1 diastolic dysfunction on echocardiogram from August 2025 LVEF 60 to 65%. Normal functioning aortic valve prosthesis.  Weight today 218 pounds. Down from 224 pounds at last visit.  Continue salt restriction to below 2 g/day and fluid restriction below 2 L/day. Continue furosemide  40 mg every other day.  Not on beta-blockers due to relatively slow heart rates at baseline. Continue valsartan  320 mg once daily. After CAD evaluation as reviewed above, will consider further titrating up medications with SGLT2 inhibitors.       Coronary atherosclerosis - Primary   Minimal coronary atherosclerosis on cardiac cath March 2022 prior to her aortic valve surgery.  Now with symptoms of  chest pain progressive over the last few weeks.  Significant concern for anginal symptoms. Advised her to continue aspirin  81 mg once daily Continue pravastatin  40 mg once daily.  Further evaluation for ischemia recommended. Options discussed. Given her ongoing symptoms and prior history of valve surgery, stress test and cardiac CT coronary angiograms are not optimal. Discussed further evaluation with coronary angiogram by cardiac catheterization tentatively at [facility]. She is familiar with the procedure having underwent this previously.  Discussed the procedure at length. Shared Decision Making/Informed Consent{ The risks [stroke (1 in 1000), death (1 in 1000), kidney failure [usually temporary] (1 in 500), bleeding (1 in 200), allergic reaction [possibly serious] (1 in 200)], benefits (diagnostic support and management of coronary artery disease) and alternatives of a cardiac catheterization were discussed in detail with her and her husband and he is willing to proceed.  Agreeable to proceed with the procedure, tentatively schedule at [facility] at the earliest.  Advised her to head to the ER or call 911 if her symptoms are acutely worsening. If no significant obstructive CAD, would be reassuring, continue with heart failure management as below and recommend undergoing further evaluation for her dysphagia.       Other Visit Diagnoses       Chest pain of uncertain etiology       Relevant Orders   EKG 12-Lead (Completed)   CBC      Return to clinic tentatively in 4 weeks.   Addendum 01/27/2024: Prior authorization and peer to peer with insurance company Dr. Aloha Approved Information: [id number]  History of Present Illness:    Megan Galvan is  a 73 y.o. female who is being seen today for follow-up visit. PCP is Galvan, Abigail, MD. Last visit with me in the office was 11/17/2023.   history of severe aortic stenosis s/p surgical aortic valve  replacement [21 mm Edwards  Resilia Inspira bioprosthetic 08/10/2020], transient postop atrial fibrillation managed with amiodarone , minimal coronary atherosclerosis on cardiac cath March 2022 prior to valve surgery, chronic diastolic CHF, diabetes, hyperlipidemia, obstructive sleep apnea-uses CPAP, GERD, vertigo, s/p C-spine surgery October 2024.  Last echocardiogram completed 12/04/2023 LVEF 60 to 65%, grade 1 diastolic dysfunction, normal RV function, well-seated aortic valve prosthesis with peak velocity 2.8 m/s and mean gradient 15 mmHg.  Reached out to her office about a week ago for symptoms of bilateral lower extremity edema has been getting worse and weight went up to 224 pounds. Was advised to titrate up furosemide  dose to 40 mg once daily for 3 days and after that to take 40 mg once every other day.  Blood work from 01/19/2024 NT-proBNP mildly elevated 304. BUN 18, creatinine 1.01, eGFR 59, CKD stage II/III Sodium 137, potassium 3.9.  Last lipid panel 11/21/2023 with total cholesterol 124, triglycerides 125, HDL 39 and LDL 63.  Here for follow-up visit Weight at last office visit 223 pounds.  Mentions that she has been dealing with symptoms of shortness of breath and pedal edema for the last few weeks which has improved with the higher dose of Lasix .  She also has been dealing with symptoms of chest pain which she describes as a pain and pressure in the upper part of the chest which is present on and off but more consistent recently and worse with exertion.  She also describes discomfort in throat with swallowing which has been chronic since her spine surgeries.  She is pending further evaluation with imaging next week and follow-up with the surgeon.  Denies any nausea vomiting. Denies any palpitations. Denies any syncopal episodes. Denies any blood in urine or stools.  EKG in the clinic today shows sinus rhythm heart rate 57/min, PR interval 188 ms, QRS duration 66 ms, QTc 424  ms.   Past Medical History:  Diagnosis Date   Acute cystitis without hematuria 06/21/2022   Allergic sinusitis 08/27/2022   Atrophic vaginitis 03/31/2021   Candidiasis, intertrigo 12/21/2023   Cataract    Chronic diastolic CHF (congestive heart failure), NYHA class 2 (HCC) 11/17/2023   Class 1 obesity due to excess calories with serious comorbidity and body mass index (BMI) of 33.0 to 33.9 in adult 03/28/2022   Class 1 obesity due to excess calories with serious comorbidity and body mass index (BMI) of 34.0 to 34.9 in adult 03/28/2022   Class 2 severe obesity with serious comorbidity and body mass index (BMI) of 37.0 to 37.9 in adult 06/07/2019   Dysuria 04/07/2021   Encounter for osteoporosis screening in asymptomatic postmenopausal patient 06/28/2022   Essential hypertension 05/20/2023   External hemorrhoids 11/12/2021   Gastro-esophageal reflux disease without esophagitis 06/03/2019   History of kidney stones    Joint pain 05/20/2023   Mixed hyperlipidemia    Mucositis (ulcerative) of vagina and vulva 06/21/2022   Myalgia 06/07/2019   Neck pain, chronic 10/18/2022   Need for hepatitis C screening test 11/23/2023   Numbness in right leg 09/08/2023   Pain in both hands 02/08/2023   Paresthesias 09/04/2021   Paroxysmal atrial fibrillation (HCC) 11/23/2023   Primary insomnia 06/03/2019   Rectal burning 12/03/2022   Rectal itching 12/03/2022   Rectal pain 09/04/2021  S/P aortic valve replacement with bioprosthetic valve 08/10/2020   21 mm Edwards Resilia Inspiria Bioprosthetic Tissue Valve  SN [id number] Model 11500A   S/P cervical spinal fusion 02/19/2023   Sleep apnea    cpap   Type 2 diabetes mellitus with diabetic polyneuropathy, without long-term current use of insulin  (HCC) 06/03/2019   Venous insufficiency of both lower extremities 09/08/2023    Past Surgical History:  Procedure Laterality Date   ANTERIOR CERVICAL DECOMP/DISCECTOMY FUSION     x 2   ANTERIOR CERVICAL  DECOMP/DISCECTOMY FUSION N/A 02/19/2023   Procedure: Anterior Cervical Discectomy Fusion-Cervical Three-Cervical Four, Removal Plate Cervical Four-Cervical Five;  Surgeon: Joshua Alm Hamilton, MD;  Location: [facility] OR;  Service: Neurosurgery;  Laterality: N/A;  3C   AORTIC VALVE REPLACEMENT N/A 08/10/2020   Procedure: AORTIC VALVE REPLACEMENT (AVR) USING INSPIRIS VALVE SIZE ;  Surgeon: Dusty Sudie DEL, MD;  Location: [facility] OR;  Service: Open Heart Surgery;  Laterality: N/A;   BACK SURGERY  11/17/2016   L3-L5   BILATERAL CARPAL TUNNEL RELEASE     bone spur Left    left shoulder   bone spur Right    Right shoulder   CATARACT EXTRACTION Left 09/18/2021   COLONOSCOPY  08/22/2011   Moderate predominantly sigmoid diverticulosis. Small internal hemorrhoids.    COLONOSCOPY     HEMORROIDECTOMY  02/2019   RIGHT/LEFT HEART CATH AND CORONARY ANGIOGRAPHY N/A 07/27/2020   Procedure: RIGHT/LEFT HEART CATH AND CORONARY ANGIOGRAPHY;  Surgeon: Wonda Sharper, MD;  Location: [facility] INVASIVE CV LAB;  Service: Cardiovascular;  Laterality: N/A;   TEE WITHOUT CARDIOVERSION N/A 08/10/2020   Procedure: TRANSESOPHAGEAL ECHOCARDIOGRAM (TEE);  Surgeon: Dusty Sudie DEL, MD;  Location: [facility] OR;  Service: Open Heart Surgery;  Laterality: N/A;   torn rotator cuff Right    Right shoulder   torn rotator cuff Left    left shoulder   TRIGGER FINGER RELEASE Left    thumb   TRIGGER FINGER RELEASE Right    thumb and middle finger   UPPER GASTROINTESTINAL ENDOSCOPY     WRIST SURGERY Left    torn ligament   WRIST SURGERY Right    cyst    Current Medications: Current Meds  Medication Sig   ACCU-CHEK GUIDE TEST test strip USE 1 STRIP WITH METER DAILY IN  THE AFTERNOON   Accu-Chek Softclix Lancets lancets CHECK BLOOD SUGAR DAILY   acetaminophen  (TYLENOL ) 500 MG tablet Take 1,000 mg by mouth 2 (two) times daily.   AMBULATORY NON FORMULARY MEDICATION Diltiazem  2% compounded with lidocaine  5% ointment applied to the rectum 3  times daily for 8 weeks.   AMBULATORY NON FORMULARY MEDICATION Nitroglycerine ointment 0.125 %. Apply a pea sized amount internally four times daily.   aspirin  EC 81 MG tablet Take 1 tablet (81 mg total) by mouth daily. Swallow whole.   Blood Glucose Monitoring Suppl (ACCU-CHEK GUIDE ME) w/Device KIT Use as directed   Calcium Carb-Cholecalciferol (CALCIUM 600 + D PO) Take 2 tablets by mouth daily.   cetirizine (ZYRTEC) 10 MG tablet Take 10 mg by mouth daily.   chlorthalidone  (HYGROTON ) 25 MG tablet Take 1 tablet (25 mg total) by mouth daily.   clotrimazole -betamethasone  (LOTRISONE ) cream Apply 1 Application topically daily.   famotidine  (PEPCID ) 40 MG tablet TAKE 1 TABLET BY MOUTH AT  BEDTIME   fluticasone  (FLONASE ) 50 MCG/ACT nasal spray Place 1 spray into the nose daily as needed for allergies.   furosemide  (LASIX ) 40 MG tablet Take 40 mg (1 tablet) daily for  3 days then take 40 mg (1 tablet) every other day   hydrocortisone  (ANUSOL -HC) 25 MG suppository Place 1 suppository (25 mg total) rectally 2 (two) times daily.   Melatonin 5 MG CAPS Take 5 mg by mouth at bedtime.   Multiple Vitamin (MULTIVITAMIN WITH MINERALS) TABS tablet Take 1 tablet by mouth daily.   Multiple Vitamins-Minerals (HAIR SKIN & NAILS) TABS Take 1 tablet by mouth daily.   nystatin  (MYCOSTATIN /NYSTOP ) powder Apply 1 Application topically 3 (three) times daily.   nystatin  ointment (MYCOSTATIN ) Apply 1 Application topically 2 (two) times daily as needed (yeast / itching). Mix with triamcinolone    omeprazole  (PRILOSEC) 40 MG capsule TAKE 1 CAPSULE BY MOUTH TWICE  DAILY BEFORE MEALS   pioglitazone  (ACTOS ) 30 MG tablet TAKE 1 TABLET BY MOUTH DAILY   potassium chloride  SA (KLOR-CON  M) 20 MEQ tablet TAKE 2 TABLETS BY MOUTH TWICE  DAILY   pravastatin  (PRAVACHOL ) 40 MG tablet TAKE 1 TABLET BY MOUTH AT  BEDTIME   pregabalin  (LYRICA ) 150 MG capsule Take 1 capsule (150 mg total) by mouth 3 (three) times daily.   PREMARIN  vaginal cream  APPLY TWICE A week.   PRESCRIPTION MEDICATION Place 1 Application rectally daily as needed (Hemorrhoids). NIFEDIPINE 5% ointment   traZODone  (DESYREL ) 100 MG tablet TAKE 1 TABLET BY MOUTH BEFORE  BEDTIME   triamcinolone  ointment (KENALOG ) 0.1 % Apply 1 Application topically 2 (two) times daily as needed (yeast / itching). Mix with nystatin    valsartan  (DIOVAN ) 320 MG tablet TAKE 1 TABLET BY MOUTH DAILY     Allergies:   Ace inhibitors, Farxiga [dapagliflozin], Jardiance [empagliflozin], Keflex [cephalexin], Latex, Doxycycline, Metformin and related, Nexium [esomeprazole magnesium ], and Protonix  [pantoprazole  sodium]   Social History   Socioeconomic History   Marital status: Married    Spouse name: Eddie   Number of children: 2   Years of education: Not on file   Highest education level: Not on file  Occupational History   Not on file  Tobacco Use   Smoking status: Never   Smokeless tobacco: Never  Vaping Use   Vaping status: Never Used  Substance and Sexual Activity   Alcohol use: Never   Drug use: Never   Sexual activity: Yes    Birth control/protection: Post-menopausal  Other Topics Concern   Not on file  Social History Narrative   One son lives in home, the other son lives about 3 miles away   Social Drivers of Health   Financial Resource Strain: Low Risk  (09/25/2023)   Overall Financial Resource Strain (CARDIA)    Difficulty of Paying Living Expenses: Not hard at all  Food Insecurity: No Food Insecurity (09/25/2023)   Hunger Vital Sign    Worried About Running Out of Food in the Last Year: Never true    Ran Out of Food in the Last Year: Never true  Transportation Needs: No Transportation Needs (09/25/2023)   PRAPARE - Administrator, Civil Service (Medical): No    Lack of Transportation (Non-Medical): No  Physical Activity: Inactive (09/25/2023)   Exercise Vital Sign    Days of Exercise per Week: 0 days    Minutes of Exercise per Session: 0 min  Stress:  No Stress Concern Present (09/25/2023)   [employer] of Occupational Health - Occupational Stress Questionnaire    Feeling of Stress : Not at all  Social Connections: Moderately Integrated (09/25/2023)   Social Connection and Isolation Panel    Frequency of Communication with Friends and Family:  More than three times a week    Frequency of Social Gatherings with Friends and Family: More than three times a week    Attends Religious Services: More than 4 times per year    Active Member of [employer] or Organizations: No    Attends Engineer, structural: Never    Marital Status: Married     Family History: The patient's family history includes Colon cancer in her paternal grandmother; Colon cancer (age of onset: 39) in her brother; Diabetes in her mother; Heart Problems in her maternal grandfather and mother. There is no history of Esophageal cancer, Rectal cancer, Stomach cancer, or Breast cancer. ROS:   Please see the history of present illness.    All 14 point review of systems negative except as described per history of present illness.  EKGs/Labs/Other Studies Reviewed:    The following studies were reviewed today:   EKG:  EKG Interpretation Date/Time:  Wednesday January 21 2024 08:29:55 EDT Ventricular Rate:  57 PR Interval:  188 QRS Duration:  66 QT Interval:  436 QTC Calculation: 424 R Axis:   28  Text Interpretation: Sinus bradycardia Anterior infarct (cited on or before 08-Feb-2008) Abnormal ECG When compared with ECG of 13-Feb-2023 13:38, Criteria for Inferior infarct are no longer Present Confirmed by Liborio Hai reddy [phone]) on 01/21/2024 8:57:19 AM    Recent Labs: 02/04/2023: TSH 2.100 11/21/2023: Hemoglobin 11.5; Platelets 232 12/19/2023: ALT 23; Magnesium  1.7 01/19/2024: BUN 18; Creatinine, Ser 1.01; NT-Pro BNP 304; Potassium 3.9; Sodium 137  Recent Lipid Panel    Component Value Date/Time   CHOL 124 11/21/2023 1039   TRIG 125 11/21/2023 1039    HDL 39 (L) 11/21/2023 1039   CHOLHDL 3.2 11/21/2023 1039   LDLCALC 63 11/21/2023 1039    Physical Exam:    VS:  BP (!) 154/70 (BP Location: Left Arm, Patient Position: Sitting)   Pulse 76   Ht 5' 6 (1.676 m)   Wt 218 lb (98.9 kg)   SpO2 98%   BMI 35.19 kg/m     Wt Readings from Last 3 Encounters:  01/21/24 218 lb (98.9 kg)  12/19/23 223 lb (101.2 kg)  11/21/23 241 lb (109.3 kg)     GENERAL:  Well nourished, well developed in no acute distress NECK: No JVD; No carotid bruits CARDIAC: RRR, S1 and S2 present, 3/6 ejection systolic murmur best heard right upper sternal border. CHEST:  Clear to auscultation without rales, wheezing or rhonchi  Extremities: No pitting pedal edema. Pulses bilaterally symmetric with radial 2+ and dorsalis pedis 2+ NEUROLOGIC:  Alert and oriented x 3  Medication Adjustments/Labs and Tests Ordered: Current medicines are reviewed at length with the patient today.  Concerns regarding medicines are outlined above.  Orders Placed This Encounter  Procedures   CBC   EKG 12-Lead   No orders of the defined types were placed in this encounter.   Signed, Hai jess Liborio, MD, MPH, [facility]. 01/21/2024 9:40 AM    [facility]

## 2024-01-22 ENCOUNTER — Ambulatory Visit: Payer: Self-pay

## 2024-01-22 ENCOUNTER — Encounter (HOSPITAL_BASED_OUTPATIENT_CLINIC_OR_DEPARTMENT_OTHER): Payer: Self-pay

## 2024-01-22 ENCOUNTER — Ambulatory Visit (HOSPITAL_BASED_OUTPATIENT_CLINIC_OR_DEPARTMENT_OTHER)
Admission: RE | Admit: 2024-01-22 | Discharge: 2024-01-22 | Disposition: A | Discharge status: Home / Self Care | Source: Ambulatory Visit | Attending: Family Medicine | Admitting: Family Medicine

## 2024-01-22 ENCOUNTER — Ambulatory Visit (INDEPENDENT_AMBULATORY_CARE_PROVIDER_SITE_OTHER): Admitting: Radiology

## 2024-01-22 VITALS — BP 175/77 | HR 73 | Temp 98.2°F | Resp 20

## 2024-01-22 DIAGNOSIS — I809 Phlebitis and thrombophlebitis of unspecified site: Secondary | ICD-10-CM

## 2024-01-22 DIAGNOSIS — M79661 Pain in right lower leg: Secondary | ICD-10-CM

## 2024-01-22 DIAGNOSIS — M79675 Pain in left toe(s): Secondary | ICD-10-CM

## 2024-01-22 DIAGNOSIS — M79604 Pain in right leg: Secondary | ICD-10-CM | POA: Diagnosis not present

## 2024-01-22 DIAGNOSIS — M7989 Other specified soft tissue disorders: Secondary | ICD-10-CM

## 2024-01-22 LAB — CBC
Hematocrit: 36.2 % (ref 34.0–46.6)
Hemoglobin: 11.9 g/dL (ref 11.1–15.9)
MCH: 33.1 pg — ABNORMAL HIGH (ref 26.6–33.0)
MCHC: 32.9 g/dL (ref 31.5–35.7)
MCV: 101 fL — ABNORMAL HIGH (ref 79–97)
Platelets: 263 x10E3/uL (ref 150–450)
RBC: 3.6 x10E6/uL — ABNORMAL LOW (ref 3.77–5.28)
RDW: 13 % (ref 11.7–15.4)
WBC: 7.3 x10E3/uL (ref 3.4–10.8)

## 2024-01-22 MED ORDER — MELOXICAM 7.5 MG PO TABS: ORAL_TABLET | ORAL | 0 refills | Status: DC

## 2024-01-22 NOTE — Telephone Encounter (Signed)
 Confirmed no in office same day availability with [facility], pt agreed to go to UC now.  FYI Only or Action Required?: FYI only for provider.  Patient was last seen in primary care on 12/19/2023 by Sherre Clapper, MD.  Called Nurse Triage reporting Leg Swelling.  Symptoms began several days ago.  Interventions attempted: Rest, hydration, or home remedies.  Symptoms are: gradually worsening.  Triage Disposition: See HCP Within 4 Hours (Or PCP Triage)  Patient/caregiver understands and will follow disposition?: Yes Reason for Disposition  [1] Thigh or calf pain AND [2] only 1 side AND [3] present > 1 hour  [1] Thigh, calf, or ankle swelling AND [2] only 1 side  [1] Red area or streak [2] large (> 2 inches or 5 cm)  Answer Assessment - Initial Assessment Questions Patient reports right lower leg non-pitting edema, pain and redness x 2-3 days. Is able to identify 2 pinpoint locations that are more painful, located behind her knee. Denied higher acuity symptoms including chest pain or SOB. Hx of CHF, aortic stenosis, atherosclerosis  and DM.  States she saw cardiologist yesterday but stated he was more interested in my veins. Is scheduled for cardiac cath. D/t DVT r/f and symptoms, this RN called [facility] to see if there was availability to see her today. No availability, pt agreed to go to UC. This RN scheduled pt for appt at closest UC to her home.  1. ONSET: When did the swelling start? (e.g., minutes, hours, days)     2-3 days  2. LOCATION: What part of the leg is swollen?  Are both legs swollen or just one leg?     Right lower leg  3. SEVERITY: How bad is the swelling? (e.g., localized; mild, moderate, severe)     Severe  4. REDNESS: Is there redness or signs of infection?     Yes, denies warmth  5. PAIN: Is the swelling painful to touch? If Yes, ask: How painful is it?   (Scale 1-10; mild, moderate or severe)     Painful to the touch  6. FEVER: Do you have a fever? If  Yes, ask: What is it, how was it measured, and when did it start?      Denies  7. CAUSE: What do you think is causing the leg swelling?     Unknown  8. MEDICAL HISTORY: Do you have a history of blood clots (e.g., DVT), cancer, heart failure, kidney disease, or liver failure?     CHF  9. RECURRENT SYMPTOM: Have you had leg swelling before? If Yes, ask: When was the last time? What happened that time?     Denies  10. OTHER SYMPTOMS: Do you have any other symptoms? (e.g., chest pain, difficulty breathing)       Denies  Protocols used: Leg Swelling and Edema-A-AH Copied from CRM #[id number]. Topic: Clinical - Red Word Triage >> Jan 22, 2024  2:47 PM Tiffini S wrote: Red Word that prompted transfer to Nurse Triage: Patient called about right leg below knee started in the last few day- swollen, painful to touch/ schedule appt for 01/23/24

## 2024-01-22 NOTE — ED Provider Notes (Signed)
 [facility]    CSN: [id number] Arrival date & time: 01/22/24  1605      History   Chief Complaint Chief Complaint  Patient presents with   Leg Pain    Entered by patient    HPI Megan Galvan is a 73 y.o. female.   73 year old female with report of acute onset right lower leg swelling and tenderness that started on 01/20/2024.  She denies any injury nor trauma.  Today she had a specific area of swelling on the right medial anterior lower leg.  Her leg feels normal temperature or even slightly cold but is not hot.  She has pain in her calf with dorsal duction of the right foot.  She feels that the lower part of her right leg is cool to touch.  She is concerned that she might have a blood clot in the right lower leg.  She is also having some pain in her left toe at the base of the left foot.  The left toe pain started on 01/20/2024.  After some discussion she does remember that she has been told in the past that she has gout.  She really does not remember a gout attack anytime in recent years.  It hurts for her to put weight on the left foot and to walk on the left foot.   Leg Pain Associated symptoms: no back pain and no fever     Past Medical History:  Diagnosis Date   Acute cystitis without hematuria 06/21/2022   Allergic sinusitis 08/27/2022   Atrophic vaginitis 03/31/2021   Candidiasis, intertrigo 12/21/2023   Cataract    Chronic diastolic CHF (congestive heart failure), NYHA class 2 (HCC) 11/17/2023   Class 1 obesity due to excess calories with serious comorbidity and body mass index (BMI) of 33.0 to 33.9 in adult 03/28/2022   Class 1 obesity due to excess calories with serious comorbidity and body mass index (BMI) of 34.0 to 34.9 in adult 03/28/2022   Class 2 severe obesity with serious comorbidity and body mass index (BMI) of 37.0 to 37.9 in adult 06/07/2019   Dysuria 04/07/2021   Encounter for osteoporosis screening in asymptomatic postmenopausal patient  06/28/2022   Essential hypertension 05/20/2023   External hemorrhoids 11/12/2021   Gastro-esophageal reflux disease without esophagitis 06/03/2019   History of kidney stones    Joint pain 05/20/2023   Mixed hyperlipidemia    Mucositis (ulcerative) of vagina and vulva 06/21/2022   Myalgia 06/07/2019   Neck pain, chronic 10/18/2022   Need for hepatitis C screening test 11/23/2023   Numbness in right leg 09/08/2023   Pain in both hands 02/08/2023   Paresthesias 09/04/2021   Paroxysmal atrial fibrillation (HCC) 11/23/2023   Primary insomnia 06/03/2019   Rectal burning 12/03/2022   Rectal itching 12/03/2022   Rectal pain 09/04/2021   S/P aortic valve replacement with bioprosthetic valve 08/10/2020   21 mm Edwards Resilia Inspiria Bioprosthetic Tissue Valve  SN [id number] Model 11500A   S/P cervical spinal fusion 02/19/2023   Sleep apnea    cpap   Type 2 diabetes mellitus with diabetic polyneuropathy, without long-term current use of insulin  (HCC) 06/03/2019   Venous insufficiency of both lower extremities 09/08/2023    Patient Active Problem List   Diagnosis Date Noted   Coronary atherosclerosis 01/21/2024   History of kidney stones    Candidiasis, intertrigo 12/21/2023   Paroxysmal atrial fibrillation (HCC) 11/23/2023   Need for hepatitis C screening test 11/23/2023   Chronic diastolic  CHF (congestive heart failure), NYHA class 2 (HCC) 11/17/2023   Numbness in right leg 09/08/2023   Venous insufficiency of both lower extremities 09/08/2023   Joint pain 05/20/2023   Essential hypertension 05/20/2023   S/P cervical spinal fusion 02/19/2023   Pain in both hands 02/08/2023   Rectal burning 12/03/2022   Rectal itching 12/03/2022   Neck pain, chronic 10/18/2022   Allergic sinusitis 08/27/2022   Encounter for osteoporosis screening in asymptomatic postmenopausal patient 06/28/2022   Acute cystitis without hematuria 06/21/2022   Mucositis (ulcerative) of vagina and vulva 06/21/2022    Class 1 obesity due to excess calories with serious comorbidity and body mass index (BMI) of 34.0 to 34.9 in adult 03/28/2022   Class 1 obesity due to excess calories with serious comorbidity and body mass index (BMI) of 33.0 to 33.9 in adult 03/28/2022   External hemorrhoids 11/12/2021   Paresthesias 09/04/2021   Rectal pain 09/04/2021   Dysuria 04/07/2021   Atrophic vaginitis 03/31/2021   S/P aortic valve replacement with bioprosthetic valve 08/10/2020   Sleep apnea    Cataract    Mixed hyperlipidemia    Myalgia 06/07/2019   Class 2 severe obesity with serious comorbidity and body mass index (BMI) of 37.0 to 37.9 in adult 06/07/2019   Gastro-esophageal reflux disease without esophagitis 06/03/2019   Type 2 diabetes mellitus with diabetic polyneuropathy, without long-term current use of insulin  (HCC) 06/03/2019   Primary insomnia 06/03/2019    Past Surgical History:  Procedure Laterality Date   ANTERIOR CERVICAL DECOMP/DISCECTOMY FUSION     x 2   ANTERIOR CERVICAL DECOMP/DISCECTOMY FUSION N/A 02/19/2023   Procedure: Anterior Cervical Discectomy Fusion-Cervical Three-Cervical Four, Removal Plate Cervical Four-Cervical Five;  Surgeon: Joshua Alm Hamilton, MD;  Location: [facility] OR;  Service: Neurosurgery;  Laterality: N/A;  3C   AORTIC VALVE REPLACEMENT N/A 08/10/2020   Procedure: AORTIC VALVE REPLACEMENT (AVR) USING INSPIRIS VALVE SIZE ;  Surgeon: Dusty Sudie DEL, MD;  Location: [facility] OR;  Service: Open Heart Surgery;  Laterality: N/A;   BACK SURGERY  11/17/2016   L3-L5   BILATERAL CARPAL TUNNEL RELEASE     bone spur Left    left shoulder   bone spur Right    Right shoulder   CATARACT EXTRACTION Left 09/18/2021   COLONOSCOPY  08/22/2011   Moderate predominantly sigmoid diverticulosis. Small internal hemorrhoids.    COLONOSCOPY     HEMORROIDECTOMY  02/2019   RIGHT/LEFT HEART CATH AND CORONARY ANGIOGRAPHY N/A 07/27/2020   Procedure: RIGHT/LEFT HEART CATH AND CORONARY ANGIOGRAPHY;   Surgeon: Wonda Sharper, MD;  Location: [facility] INVASIVE CV LAB;  Service: Cardiovascular;  Laterality: N/A;   TEE WITHOUT CARDIOVERSION N/A 08/10/2020   Procedure: TRANSESOPHAGEAL ECHOCARDIOGRAM (TEE);  Surgeon: Dusty Sudie DEL, MD;  Location: [facility] OR;  Service: Open Heart Surgery;  Laterality: N/A;   torn rotator cuff Right    Right shoulder   torn rotator cuff Left    left shoulder   TRIGGER FINGER RELEASE Left    thumb   TRIGGER FINGER RELEASE Right    thumb and middle finger   UPPER GASTROINTESTINAL ENDOSCOPY     WRIST SURGERY Left    torn ligament   WRIST SURGERY Right    cyst    OB History   No obstetric history on file.      Home Medications    Prior to Admission medications   Medication Sig Start Date End Date Taking? Authorizing Provider  chlorthalidone  (HYGROTON ) 25 MG tablet Take 1 tablet (  25 mg total) by mouth daily. 09/08/23 03/06/24 Yes Sirivol, Mamatha, MD  famotidine  (PEPCID ) 40 MG tablet TAKE 1 TABLET BY MOUTH AT  BEDTIME 06/12/23  Yes Cox, Kirsten, MD  furosemide  (LASIX ) 40 MG tablet Take 40 mg (1 tablet) daily for 3 days then take 40 mg (1 tablet) every other day 01/14/24  Yes Madireddy, Alean SAUNDERS, MD  meloxicam  (MOBIC ) 7.5 MG tablet Take meloxicam , 7.5 mg, 1 pill every 12 hours, with food, as needed for toe pain. 01/22/24  Yes Ival Domino, FNP  pioglitazone  (ACTOS ) 30 MG tablet TAKE 1 TABLET BY MOUTH DAILY 09/22/23  Yes Cox, Kirsten, MD  potassium chloride  SA (KLOR-CON  M) 20 MEQ tablet TAKE 2 TABLETS BY MOUTH TWICE  DAILY 10/26/23  Yes Cox, Kirsten, MD  pravastatin  (PRAVACHOL ) 40 MG tablet TAKE 1 TABLET BY MOUTH AT  BEDTIME 10/05/23  Yes Cox, Kirsten, MD  traZODone  (DESYREL ) 100 MG tablet TAKE 1 TABLET BY MOUTH BEFORE  BEDTIME 01/18/24  Yes Cox, Kirsten, MD  valsartan  (DIOVAN ) 320 MG tablet TAKE 1 TABLET BY MOUTH DAILY 10/26/23  Yes Cox, Kirsten, MD  ACCU-CHEK GUIDE TEST test strip USE 1 STRIP WITH METER DAILY IN  THE AFTERNOON 07/15/23   Sirivol, Mamatha, MD  Accu-Chek  Softclix Lancets lancets CHECK BLOOD SUGAR DAILY 08/03/23   Sirivol, Mamatha, MD  acetaminophen  (TYLENOL ) 500 MG tablet Take 1,000 mg by mouth 2 (two) times daily.    [provider]  AMBULATORY NON FORMULARY MEDICATION Diltiazem  2% compounded with lidocaine  5% ointment applied to the rectum 3 times daily for 8 weeks. 04/10/23   Charlanne Groom, MD  AMBULATORY NON FORMULARY MEDICATION Nitroglycerine ointment 0.125 %. Apply a pea sized amount internally four times daily. 06/20/23   Charlanne Groom, MD  aspirin  EC 81 MG tablet Take 1 tablet (81 mg total) by mouth daily. Swallow whole. 09/14/20   Monetta Redell PARAS, MD  Blood Glucose Monitoring Suppl (ACCU-CHEK GUIDE ME) w/Device KIT Use as directed 09/19/20   CoxAbigail, MD  Calcium Carb-Cholecalciferol (CALCIUM 600 + D PO) Take 2 tablets by mouth daily.    [provider]  cetirizine (ZYRTEC) 10 MG tablet Take 10 mg by mouth daily.    [provider]  clotrimazole -betamethasone  (LOTRISONE ) cream Apply 1 Application topically daily. 07/01/23   CoxAbigail, MD  fluticasone  (FLONASE ) 50 MCG/ACT nasal spray Place 1 spray into the nose daily as needed for allergies.    [provider]  hydrocortisone  (ANUSOL -HC) 25 MG suppository Place 1 suppository (25 mg total) rectally 2 (two) times daily. 12/04/23   CoxAbigail, MD  Melatonin 5 MG CAPS Take 5 mg by mouth at bedtime.    [provider]  Multiple Vitamin (MULTIVITAMIN WITH MINERALS) TABS tablet Take 1 tablet by mouth daily.    [provider]  Multiple Vitamins-Minerals (HAIR SKIN & NAILS) TABS Take 1 tablet by mouth daily.    [provider]  nystatin  (MYCOSTATIN /NYSTOP ) powder Apply 1 Application topically 3 (three) times daily. 12/19/23   CoxAbigail, MD  nystatin  ointment (MYCOSTATIN ) Apply 1 Application topically 2 (two) times daily as needed (yeast / itching). Mix with triamcinolone     [provider]  omeprazole  (PRILOSEC) 40 MG capsule  TAKE 1 CAPSULE BY MOUTH TWICE  DAILY BEFORE MEALS 12/16/23   Cox, Kirsten, MD  pregabalin  (LYRICA ) 150 MG capsule Take 1 capsule (150 mg total) by mouth 3 (three) times daily. 12/31/23   Nicholaus Credit, PA-C  PREMARIN  vaginal cream APPLY TWICE A  week. 05/20/23   Sherre Clapper, MD  PRESCRIPTION MEDICATION Place 1 Application rectally daily as needed (Hemorrhoids). NIFEDIPINE 5% ointment    [provider]  triamcinolone  ointment (KENALOG ) 0.1 % Apply 1 Application topically 2 (two) times daily as needed (yeast / itching). Mix with nystatin     [provider]    Family History Family History  Problem Relation Age of Onset   Diabetes Mother    Heart Problems Mother    Colon cancer Brother 28   Heart Problems Maternal Grandfather    Colon cancer Paternal Grandmother    Esophageal cancer Neg Hx    Rectal cancer Neg Hx    Stomach cancer Neg Hx    Breast cancer Neg Hx     Social History Social History   Tobacco Use   Smoking status: Never   Smokeless tobacco: Never  Vaping Use   Vaping status: Never Used  Substance Use Topics   Alcohol use: Never   Drug use: Never     Allergies   Ace inhibitors, Farxiga [dapagliflozin], Jardiance [empagliflozin], Keflex [cephalexin], Latex, Doxycycline, Metformin and related, Nexium [esomeprazole magnesium ], and Protonix  [pantoprazole  sodium]   Review of Systems Review of Systems  Constitutional:  Negative for chills and fever.  HENT:  Negative for ear pain and sore throat.   Eyes:  Negative for pain and visual disturbance.  Respiratory:  Negative for cough and shortness of breath.   Cardiovascular:  Positive for leg swelling (Right lower leg pain and swelling). Negative for chest pain and palpitations.  Gastrointestinal:  Negative for abdominal pain, constipation, diarrhea, nausea and vomiting.  Genitourinary:  Negative for dysuria and hematuria.  Musculoskeletal:  Positive for arthralgias (Left foot pain.). Negative for back pain.   Skin:  Negative for color change and rash.  Neurological:  Negative for seizures and syncope.  All other systems reviewed and are negative.    Physical Exam Triage Vital Signs ED Triage Vitals  Encounter Vitals Group     BP 01/22/24 1616 (!) 175/77     Girls Systolic BP Percentile --      Girls Diastolic BP Percentile --      Boys Systolic BP Percentile --      Boys Diastolic BP Percentile --      Pulse Rate 01/22/24 1616 73     Resp 01/22/24 1616 20     Temp 01/22/24 1616 98.2 F (36.8 C)     Temp Source 01/22/24 1616 Oral     SpO2 01/22/24 1616 98 %     Weight --      Height --      Head Circumference --      Peak Flow --      Pain Score 01/22/24 1614 5     Pain Loc --      Pain Education --      Exclude from Growth Chart --    No data found.  Updated Vital Signs BP (!) 175/77 (BP Location: Right Arm)   Pulse 73   Temp 98.2 F (36.8 C) (Oral)   Resp 20   SpO2 98%   Visual Acuity Right Eye Distance:   Left Eye Distance:   Bilateral Distance:    Right Eye Near:   Left Eye Near:    Bilateral Near:     Physical Exam Vitals and nursing note reviewed.  Constitutional:      General: She is not in acute distress.    Appearance: She is well-developed. She is  not ill-appearing or toxic-appearing.  HENT:     Head: Normocephalic and atraumatic.     Right Ear: Hearing, tympanic membrane, ear canal and external ear normal.     Left Ear: Hearing, tympanic membrane, ear canal and external ear normal.     Nose: No congestion or rhinorrhea.     Right Sinus: No maxillary sinus tenderness or frontal sinus tenderness.     Left Sinus: No maxillary sinus tenderness or frontal sinus tenderness.     Mouth/Throat:     Lips: Pink.     Mouth: Mucous membranes are moist.     Pharynx: Uvula midline. No oropharyngeal exudate or posterior oropharyngeal erythema.     Tonsils: No tonsillar exudate.  Eyes:     Conjunctiva/sclera: Conjunctivae normal.     Pupils: Pupils are equal,  round, and reactive to light.  Cardiovascular:     Rate and Rhythm: Normal rate and regular rhythm.     Heart sounds: S1 normal and S2 normal. No murmur heard. Pulmonary:     Effort: Pulmonary effort is normal. No respiratory distress.     Breath sounds: Normal breath sounds. No decreased breath sounds, wheezing, rhonchi or rales.  Abdominal:     General: Bowel sounds are normal.     Palpations: Abdomen is soft.     Tenderness: There is no abdominal tenderness.  Musculoskeletal:        General: No swelling.     Cervical back: Neck supple.     Right knee: Normal.     Left knee: Normal.     Right lower leg: Swelling present. No deformity, lacerations, tenderness (Right medial anterior lower leg with some swelling and tenderness at the area of the swelling.  Site is normal temperature not warm nor cold.  See photo for more information.) or bony tenderness. No edema.     Left lower leg: Swelling present. No deformity, lacerations, tenderness or bony tenderness. No edema.     Right ankle: Normal.     Left ankle: Normal.     Comments: Patient does have some calf and right lower leg pain with dorsiflexion of the right foot (positive Homans' sign).  Lymphadenopathy:     Head:     Right side of head: No submental, submandibular, tonsillar, preauricular or posterior auricular adenopathy.     Left side of head: No submental, submandibular, tonsillar, preauricular or posterior auricular adenopathy.     Cervical: No cervical adenopathy.     Right cervical: No superficial cervical adenopathy.    Left cervical: No superficial cervical adenopathy.  Skin:    General: Skin is warm and dry.     Capillary Refill: Capillary refill takes less than 2 seconds.     Findings: No rash.  Neurological:     Mental Status: She is alert and oriented to person, place, and time.  Psychiatric:        Mood and Affect: Mood normal.      UC Treatments / Results  Labs (all labs ordered are listed, but only  abnormal results are displayed) Basic metabolic panel with GFR: 01/19/2024:    Component Ref Range & Units (hover) 3 d ago  Glucose 125 High   BUN 18  Creatinine, Ser 1.01 High   eGFR 59 Low   BUN/Creatinine Ratio 18  Sodium 137  Potassium 3.9  Chloride 94 Low   CO2 27  Calcium 9.4  Resulting Agency [facility]     Hemoglobin A1c: 11/21/2023:    Component Ref  Range & Units (hover) 2 mo ago (11/21/23)  Hgb A1c MFr Bld 7.0 High   Comment:          Prediabetes: 5.7 - 6.4          Diabetes: >6.4          Glycemic control for adults with diabetes: <7.0  Est. average glucose Bld gHb Est-mCnc 154  Resulting Agency [facility]     EKG   Radiology US  Venous Img Lower Unilateral Right Result Date: 01/22/2024 CLINICAL DATA:  Lower extremity pain EXAM: RIGHT LOWER EXTREMITY VENOUS DOPPLER ULTRASOUND TECHNIQUE: Gray-scale sonography with compression, as well as color and duplex ultrasound, were performed to evaluate the deep venous system(s) from the level of the common femoral vein through the popliteal and proximal calf veins. COMPARISON:  None available FINDINGS: VENOUS Normal compressibility of the common femoral, superficial femoral, and popliteal veins, as well as the visualized calf veins. Visualized portions of profunda femoral vein and great saphenous vein unremarkable. No filling defects to suggest DVT on grayscale or color Doppler imaging. Doppler waveforms show normal direction of venous flow, normal respiratory plasticity and response to augmentation. Limited views of the contralateral common femoral vein are unremarkable. OTHER No sonographic abnormality seen in the anterior RIGHT lower leg in the area of concern. Limitations: none IMPRESSION: No right lower extremity DVT. Electronically Signed   By: Aliene Lloyd M.D.   On: 01/22/2024 17:03    Procedures Procedures (including critical care time)  Medications Ordered in UC Medications - No data to display  Initial Impression /  Assessment and Plan / UC Course  I have reviewed the triage vital signs and the nursing notes.  Pertinent labs & imaging results that were available during my care of the patient were reviewed by me and considered in my medical decision making (see chart for details).  Plan of Care: Right lower leg, calf pain and swelling and probable phlebitis: Encouraged warm compresses and elevation of right lower leg.  Ultrasound was negative for blood clots.  May use meloxicam , 7.5 mg, 1 pill every 12 hours, with food, as needed for right lower leg or for toe pain.  Left great toe pain: Exam is most consistent with gout of the left great toe.  Encouraged ice and elevation.  Use meloxicam , as noted above, as needed for toe pain.  If symptoms persist needs to see primary care for further workup.  Follow-up here if needed.  I reviewed the plan of care with the patient and/or the patient's guardian.  The patient and/or guardian had time to ask questions and acknowledged that the questions were answered.  I provided instruction on symptoms or reasons to return here or to go to an ER, if symptoms/condition did not improve, worsened or if new symptoms occurred.  Final Clinical Impressions(s) / UC Diagnoses   Final diagnoses:  Pain of right lower leg  Right calf pain  Pain and swelling of right lower leg  Pain of left great toe  Phlebitis     Discharge Instructions      Right lower leg, calf pain and swelling and probable phlebitis: Encouraged warm compresses and elevation of right lower leg.  Ultrasound was negative for blood clots.  May use meloxicam , 7.5 mg, 1 pill every 12 hours, with food, as needed for right lower leg or for toe pain.  Left great toe pain: Exam is most consistent with gout of the left great toe.  Encouraged ice and elevation.  Use meloxicam ,  as noted above, as needed for toe pain.  If symptoms persist needs to see primary care for further workup.  Follow-up here if  needed.     ED Prescriptions     Medication Sig Dispense Auth. Provider   meloxicam  (MOBIC ) 7.5 MG tablet Take meloxicam , 7.5 mg, 1 pill every 12 hours, with food, as needed for toe pain. 30 tablet Everette Mall, FNP      PDMP not reviewed this encounter.   Ival Domino, FNP 01/22/24 [phone]

## 2024-01-22 NOTE — ED Triage Notes (Addendum)
 Pt c/o right leg swelling and tender to touch for a couple of days. Pt reports her lower part of leg is cold to touch.  Pt reports pain to left toe and at base of foot.

## 2024-01-22 NOTE — Discharge Instructions (Addendum)
 Right lower leg, calf pain and swelling and probable phlebitis: Encouraged warm compresses and elevation of right lower leg.  Ultrasound was negative for blood clots.  May use meloxicam , 7.5 mg, 1 pill every 12 hours, with food, as needed for right lower leg or for toe pain.  Left great toe pain: Exam is most consistent with gout of the left great toe.  Encouraged ice and elevation.  Use meloxicam , as noted above, as needed for toe pain.  If symptoms persist needs to see primary care for further workup.  Follow-up here if needed.

## 2024-01-23 ENCOUNTER — Ambulatory Visit (INDEPENDENT_AMBULATORY_CARE_PROVIDER_SITE_OTHER)
Admission: RE | Admit: 2024-01-23 | Discharge: 2024-01-23 | Disposition: A | Discharge status: Home / Self Care | Source: Ambulatory Visit

## 2024-01-23 ENCOUNTER — Ambulatory Visit (INDEPENDENT_AMBULATORY_CARE_PROVIDER_SITE_OTHER)

## 2024-01-23 ENCOUNTER — Ambulatory Visit: Payer: Self-pay

## 2024-01-23 VITALS — BP 110/62 | HR 60 | Temp 98.2°F | Ht 66.0 in | Wt 217.0 lb

## 2024-01-23 DIAGNOSIS — M25561 Pain in right knee: Secondary | ICD-10-CM | POA: Diagnosis not present

## 2024-01-23 DIAGNOSIS — M1711 Unilateral primary osteoarthritis, right knee: Secondary | ICD-10-CM | POA: Diagnosis not present

## 2024-01-23 NOTE — Patient Instructions (Signed)
  VISIT SUMMARY: You visited today due to acute right knee pain that has worsened over the past three days. We also discussed your ongoing issues with leg swelling, chronic low back pain, and neck stiffness with chest pain.  YOUR PLAN: ACUTE RIGHT KNEE PAIN: You have had severe right knee pain for the past two weeks, which has worsened recently. There is no history of trauma, and a blood clot has been ruled out. -Get an x-ray of your right knee as soon as possible. -If the x-ray shows severe arthritis, we will schedule a follow-up on Monday to discuss a possible knee injection. -If the x-ray is normal, continue using the pain medication prescribed by urgent care, but be cautious due to the opioid content and risk of constipation. -Use an ACE bandage for support and apply heat or ice to relieve pain. -Elevate your leg and monitor your symptoms over the weekend.  BILATERAL LOWER EXTREMITY EDEMA: You have swelling in both legs, which has improved somewhat with increased diuretic medication. -Continue taking your diuretic medication as prescribed by your cardiologist. -Monitor the swelling and report any significant changes.                      Contains text generated by [employer].                                 Contains text generated by [employer].

## 2024-01-23 NOTE — Progress Notes (Signed)
 Acute Office Visit  Subjective:    Patient ID: Megan Galvan, female    DOB: [birth year], 73 y.o.   MRN: [id number]  Chief Complaint  Patient presents with   Knee Pain    HPI: Patient is in today for   Discussed the use of AI scribe software for clinical note transcription with the patient, who gave verbal consent to proceed.  Discussed the use of AI scribe software for clinical note transcription with the patient, who gave verbal consent to proceed.  History of Present Illness   Megan Galvan is a 73 year old female who presents with acute right knee pain.  Acute right knee pain - Acute onset of right knee pain for approximately two weeks, with significant worsening over the past three days - Pain is severe and prevents weight bearing on the right knee - No history of trauma or injury to the knee - No prior history of right knee pain - Visited urgent care where a possible blood clot was ruled out - Unable to obtain x-ray at urgent care due to unavailability of staff - Prescribed opioid pain medication at urgent care  Lower extremity edema - History of leg swelling - Cardiologist increased diuretic medication lasix  dose,  resulting in some weight loss and reduction in swelling - Mild edema persists despite medication adjustment  Chronic low back pain - Chronic low back pain present - Scheduled for MRI next Tuesday  Neck stiffness and chest pain - History of neck surgery in October of the previous year - Neck stiffness present - Chest pain described as tightness radiating from the neck down to the chest         Past Medical History:  Diagnosis Date   Acute cystitis without hematuria 06/21/2022   Allergic sinusitis 08/27/2022   Atrophic vaginitis 03/31/2021   Candidiasis, intertrigo 12/21/2023   Cataract    Chronic diastolic CHF (congestive heart failure), NYHA class 2 (HCC) 11/17/2023   Class 1 obesity due to excess calories with serious comorbidity and body  mass index (BMI) of 33.0 to 33.9 in adult 03/28/2022   Class 1 obesity due to excess calories with serious comorbidity and body mass index (BMI) of 34.0 to 34.9 in adult 03/28/2022   Class 2 severe obesity with serious comorbidity and body mass index (BMI) of 37.0 to 37.9 in adult 06/07/2019   Dysuria 04/07/2021   Encounter for osteoporosis screening in asymptomatic postmenopausal patient 06/28/2022   Essential hypertension 05/20/2023   External hemorrhoids 11/12/2021   Gastro-esophageal reflux disease without esophagitis 06/03/2019   History of kidney stones    Joint pain 05/20/2023   Mixed hyperlipidemia    Mucositis (ulcerative) of vagina and vulva 06/21/2022   Myalgia 06/07/2019   Neck pain, chronic 10/18/2022   Need for hepatitis C screening test 11/23/2023   Numbness in right leg 09/08/2023   Pain in both hands 02/08/2023   Paresthesias 09/04/2021   Paroxysmal atrial fibrillation (HCC) 11/23/2023   Primary insomnia 06/03/2019   Rectal burning 12/03/2022   Rectal itching 12/03/2022   Rectal pain 09/04/2021   S/P aortic valve replacement with bioprosthetic valve 08/10/2020   21 mm Edwards Resilia Inspiria Bioprosthetic Tissue Valve  SN [id number] Model 11500A   S/P cervical spinal fusion 02/19/2023   Sleep apnea    cpap   Type 2 diabetes mellitus with diabetic polyneuropathy, without long-term current use of insulin  (HCC) 06/03/2019   Venous insufficiency of both lower extremities 09/08/2023    Past  Surgical History:  Procedure Laterality Date   ANTERIOR CERVICAL DECOMP/DISCECTOMY FUSION     x 2   ANTERIOR CERVICAL DECOMP/DISCECTOMY FUSION N/A 02/19/2023   Procedure: Anterior Cervical Discectomy Fusion-Cervical Three-Cervical Four, Removal Plate Cervical Four-Cervical Five;  Surgeon: Joshua Alm Hamilton, MD;  Location: [facility] OR;  Service: Neurosurgery;  Laterality: N/A;  3C   AORTIC VALVE REPLACEMENT N/A 08/10/2020   Procedure: AORTIC VALVE REPLACEMENT (AVR) USING INSPIRIS VALVE  SIZE ;  Surgeon: Dusty Sudie DEL, MD;  Location: [facility] OR;  Service: Open Heart Surgery;  Laterality: N/A;   BACK SURGERY  11/17/2016   L3-L5   BILATERAL CARPAL TUNNEL RELEASE     bone spur Left    left shoulder   bone spur Right    Right shoulder   CATARACT EXTRACTION Left 09/18/2021   COLONOSCOPY  08/22/2011   Moderate predominantly sigmoid diverticulosis. Small internal hemorrhoids.    COLONOSCOPY     HEMORROIDECTOMY  02/2019   RIGHT/LEFT HEART CATH AND CORONARY ANGIOGRAPHY N/A 07/27/2020   Procedure: RIGHT/LEFT HEART CATH AND CORONARY ANGIOGRAPHY;  Surgeon: Wonda Sharper, MD;  Location: [facility] INVASIVE CV LAB;  Service: Cardiovascular;  Laterality: N/A;   TEE WITHOUT CARDIOVERSION N/A 08/10/2020   Procedure: TRANSESOPHAGEAL ECHOCARDIOGRAM (TEE);  Surgeon: Dusty Sudie DEL, MD;  Location: [facility] OR;  Service: Open Heart Surgery;  Laterality: N/A;   torn rotator cuff Right    Right shoulder   torn rotator cuff Left    left shoulder   TRIGGER FINGER RELEASE Left    thumb   TRIGGER FINGER RELEASE Right    thumb and middle finger   UPPER GASTROINTESTINAL ENDOSCOPY     WRIST SURGERY Left    torn ligament   WRIST SURGERY Right    cyst    Family History  Problem Relation Age of Onset   Diabetes Mother    Heart Problems Mother    Colon cancer Brother 92   Heart Problems Maternal Grandfather    Colon cancer Paternal Grandmother    Esophageal cancer Neg Hx    Rectal cancer Neg Hx    Stomach cancer Neg Hx    Breast cancer Neg Hx     Social History   Socioeconomic History   Marital status: Married    Spouse name: Eddie   Number of children: 2   Years of education: Not on file   Highest education level: Not on file  Occupational History   Not on file  Tobacco Use   Smoking status: Never   Smokeless tobacco: Never  Vaping Use   Vaping status: Never Used  Substance and Sexual Activity   Alcohol use: Never   Drug use: Never   Sexual activity: Yes    Birth  control/protection: Post-menopausal  Other Topics Concern   Not on file  Social History Narrative   One son lives in home, the other son lives about 3 miles away   Social Drivers of Health   Financial Resource Strain: Low Risk  (09/25/2023)   Overall Financial Resource Strain (CARDIA)    Difficulty of Paying Living Expenses: Not hard at all  Food Insecurity: No Food Insecurity (09/25/2023)   Hunger Vital Sign    Worried About Running Out of Food in the Last Year: Never true    Ran Out of Food in the Last Year: Never true  Transportation Needs: No Transportation Needs (09/25/2023)   PRAPARE - Administrator, Civil Service (Medical): No    Lack of Transportation (Non-Medical):  No  Physical Activity: Inactive (09/25/2023)   Exercise Vital Sign    Days of Exercise per Week: 0 days    Minutes of Exercise per Session: 0 min  Stress: No Stress Concern Present (09/25/2023)   [employer] of Occupational Health - Occupational Stress Questionnaire    Feeling of Stress : Not at all  Social Connections: Moderately Integrated (09/25/2023)   Social Connection and Isolation Panel    Frequency of Communication with Friends and Family: More than three times a week    Frequency of Social Gatherings with Friends and Family: More than three times a week    Attends Religious Services: More than 4 times per year    Active Member of [employer] or Organizations: No    Attends Banker Meetings: Never    Marital Status: Married  Catering manager Violence: Not At Risk (09/25/2023)   Humiliation, Afraid, Rape, and Kick questionnaire    Fear of Current or Ex-Partner: No    Emotionally Abused: No    Physically Abused: No    Sexually Abused: No    Outpatient Medications Prior to Visit  Medication Sig Dispense Refill   ACCU-CHEK GUIDE TEST test strip USE 1 STRIP WITH METER DAILY IN  THE AFTERNOON 100 strip 2   Accu-Chek Softclix Lancets lancets CHECK BLOOD SUGAR DAILY 100 each 2    acetaminophen  (TYLENOL ) 500 MG tablet Take 1,000 mg by mouth 2 (two) times daily.     AMBULATORY NON FORMULARY MEDICATION Diltiazem  2% compounded with lidocaine  5% ointment applied to the rectum 3 times daily for 8 weeks. 1 each 0   AMBULATORY NON FORMULARY MEDICATION Nitroglycerine ointment 0.125 %. Apply a pea sized amount internally four times daily. 30 g 2   aspirin  EC 81 MG tablet Take 1 tablet (81 mg total) by mouth daily. Swallow whole. 90 tablet 3   Blood Glucose Monitoring Suppl (ACCU-CHEK GUIDE ME) w/Device KIT Use as directed 1 kit 0   Calcium Carb-Cholecalciferol (CALCIUM 600 + D PO) Take 2 tablets by mouth daily.     cetirizine (ZYRTEC) 10 MG tablet Take 10 mg by mouth daily.     chlorthalidone  (HYGROTON ) 25 MG tablet Take 1 tablet (25 mg total) by mouth daily. 90 tablet 1   clotrimazole -betamethasone  (LOTRISONE ) cream Apply 1 Application topically daily. 45 g 3   famotidine  (PEPCID ) 40 MG tablet TAKE 1 TABLET BY MOUTH AT  BEDTIME 100 tablet 2   fluticasone  (FLONASE ) 50 MCG/ACT nasal spray Place 1 spray into the nose daily as needed for allergies.     furosemide  (LASIX ) 40 MG tablet Take 40 mg (1 tablet) daily for 3 days then take 40 mg (1 tablet) every other day 48 tablet 3   hydrocortisone  (ANUSOL -HC) 25 MG suppository Place 1 suppository (25 mg total) rectally 2 (two) times daily. 30 suppository 2   Melatonin 5 MG CAPS Take 5 mg by mouth at bedtime.     meloxicam  (MOBIC ) 7.5 MG tablet Take meloxicam , 7.5 mg, 1 pill every 12 hours, with food, as needed for toe pain. 30 tablet 0   Multiple Vitamin (MULTIVITAMIN WITH MINERALS) TABS tablet Take 1 tablet by mouth daily.     Multiple Vitamins-Minerals (HAIR SKIN & NAILS) TABS Take 1 tablet by mouth daily.     nystatin  (MYCOSTATIN /NYSTOP ) powder Apply 1 Application topically 3 (three) times daily. 60 g 3   nystatin  ointment (MYCOSTATIN ) Apply 1 Application topically 2 (two) times daily as needed (yeast / itching). Mix with triamcinolone   omeprazole  (PRILOSEC) 40 MG capsule TAKE 1 CAPSULE BY MOUTH TWICE  DAILY BEFORE MEALS 200 capsule 2   pioglitazone  (ACTOS ) 30 MG tablet TAKE 1 TABLET BY MOUTH DAILY 100 tablet 2   potassium chloride  SA (KLOR-CON  M) 20 MEQ tablet TAKE 2 TABLETS BY MOUTH TWICE  DAILY 400 tablet 2   pravastatin  (PRAVACHOL ) 40 MG tablet TAKE 1 TABLET BY MOUTH AT  BEDTIME 100 tablet 2   pregabalin  (LYRICA ) 150 MG capsule Take 1 capsule (150 mg total) by mouth 3 (three) times daily. 90 capsule 1   PREMARIN  vaginal cream APPLY TWICE A week. 42.5 g 1   PRESCRIPTION MEDICATION Place 1 Application rectally daily as needed (Hemorrhoids). NIFEDIPINE 5% ointment     traZODone  (DESYREL ) 100 MG tablet TAKE 1 TABLET BY MOUTH BEFORE  BEDTIME 90 tablet 3   triamcinolone  ointment (KENALOG ) 0.1 % Apply 1 Application topically 2 (two) times daily as needed (yeast / itching). Mix with nystatin      valsartan  (DIOVAN ) 320 MG tablet TAKE 1 TABLET BY MOUTH DAILY 100 tablet 2   oxyCODONE -acetaminophen  (PERCOCET/ROXICET) 5-325 MG tablet Take 1 tablet by mouth every 8 (eight) hours as needed. (Patient not taking: Reported on 01/23/2024)     No facility-administered medications prior to visit.    Allergies  Allergen Reactions   Ace Inhibitors Cough   Farxiga [Dapagliflozin] Other (See Comments)    Yeast infections    Jardiance [Empagliflozin] Other (See Comments)    Yeast infections   Keflex [Cephalexin] Itching   Latex Other (See Comments)    Burn and itch   Doxycycline Rash   Metformin And Related Diarrhea   Nexium [Esomeprazole Magnesium ] Diarrhea   Protonix  [Pantoprazole  Sodium] Other (See Comments)    Constipation    Review of Systems  Constitutional:  Negative for chills, fatigue and fever.  HENT:  Negative for congestion, ear pain and sore throat.   Respiratory:  Negative for cough and shortness of breath.   Cardiovascular:  Positive for leg swelling. Negative for chest pain.  Gastrointestinal:  Negative for  abdominal pain, constipation, diarrhea, nausea and vomiting.  Genitourinary:  Negative for dysuria and frequency.  Musculoskeletal:  Positive for arthralgias and gait problem. Negative for myalgias.  Neurological:  Negative for dizziness and headaches.  Psychiatric/Behavioral:  Negative for dysphoric mood. The patient is not nervous/anxious.        Objective:        01/23/2024    9:33 AM 01/22/2024    4:16 PM 01/21/2024    8:32 AM  Vitals with BMI  Height 5' 6  5' 6  Weight 217 lbs  218 lbs  BMI 35.04  35.2  Systolic 110 175 845  Diastolic 62 77 70  Pulse 60 73 76    No data found.   Physical Exam Vitals and nursing note reviewed.  Constitutional:      Appearance: She is obese.  HENT:     Head: Normocephalic and atraumatic.  Cardiovascular:     Rate and Rhythm: Normal rate.     Heart sounds: Murmur (esm) heard.  Pulmonary:     Effort: Pulmonary effort is normal.     Breath sounds: Normal breath sounds.  Musculoskeletal:        General: Tenderness (MEDIAL JOINT TENDERNESS RIGHT KNEE) present.     Right lower leg: Edema (trace to 1+) present.     Left lower leg: Edema (trace to 1+) present.     Comments: Crepitus noted right knee Walking with  limp due to pain with weight bearing  Skin:    Comments: Varicose veins noted both legs  Neurological:     General: No focal deficit present.     Mental Status: She is alert.  Psychiatric:        Mood and Affect: Mood normal.     There are no preventive care reminders to display for this patient.   There are no preventive care reminders to display for this patient.   Lab Results  Component Value Date   TSH 2.100 02/04/2023   Lab Results  Component Value Date   WBC 7.3 01/21/2024   HGB 11.9 01/21/2024   HCT 36.2 01/21/2024   MCV 101 (H) 01/21/2024   PLT 263 01/21/2024   Lab Results  Component Value Date   NA 137 01/19/2024   K 3.9 01/19/2024   CO2 27 01/19/2024   GLUCOSE 125 (H) 01/19/2024   BUN 18  01/19/2024   CREATININE 1.01 (H) 01/19/2024   BILITOT 0.3 12/19/2023   ALKPHOS 73 12/19/2023   AST 29 12/19/2023   ALT 23 12/19/2023   PROT 6.7 12/19/2023   ALBUMIN  4.2 12/19/2023   CALCIUM 9.4 01/19/2024   ANIONGAP 10 08/13/2020   EGFR 59 (L) 01/19/2024   Lab Results  Component Value Date   CHOL 124 11/21/2023   Lab Results  Component Value Date   HDL 39 (L) 11/21/2023   Lab Results  Component Value Date   LDLCALC 63 11/21/2023   Lab Results  Component Value Date   TRIG 125 11/21/2023   Lab Results  Component Value Date   CHOLHDL 3.2 11/21/2023   Lab Results  Component Value Date   HGBA1C 7.0 (H) 11/21/2023        Results for orders placed or performed in visit on 01/21/24  CBC   Collection Time: 01/21/24  9:29 AM  Result Value Ref Range   WBC 7.3 3.4 - 10.8 x10E3/uL   RBC 3.60 (L) 3.77 - 5.28 x10E6/uL   Hemoglobin 11.9 11.1 - 15.9 g/dL   Hematocrit 63.7 65.9 - 46.6 %   MCV 101 (H) 79 - 97 fL   MCH 33.1 (H) 26.6 - 33.0 pg   MCHC 32.9 31.5 - 35.7 g/dL   RDW 86.9 88.2 - 84.5 %   Platelets 263 150 - 450 x10E3/uL     Assessment & Plan:   Assessment & Plan Acute pain of right knee Acute right knee pain for two weeks, worsening over the last three days. Pain is localized to the medial joint line of the right knee with no history of trauma or fall. Examination reveals medial joint line tenderness without redness, warmth, or significant fluid accumulation. Crepitus noted with ROM which is limited in extension.  Blood clot and infection have been ruled out due to normal CBC with diff on 01/21/24 and normal venous duplex yesterday.  - Order stat x-ray of the right knee - If x-ray shows severe arthritis, schedule follow-up on Monday for possible knee injection - If x-ray is unremarkable, advise use of pain medication prescribed by urgent care, with caution due to opioid content and risk of constipation - Recommend use of ACE bandage for support and application of  heat or ice for pain relief - Advise elevating the leg and monitoring symptoms over the weekend  Bilateral lower extremity edema Bilateral lower extremity edema with some improvement after increasing diuretic dosage. Edema is not severe, with one less pitting edema noted. No signs of infection  or blood clots.       Orders:   DG Knee Complete 4 Views Right; Future     Body mass index is 35.02 kg/m.SABRA  Assessment and Plan   Assessment and Plan            No orders of the defined types were placed in this encounter.   Orders Placed This Encounter  Procedures   DG Knee Complete 4 Views Right     Follow-up: No follow-ups on file.  An After Visit Summary was printed and given to the patient.  Kyley Solow, MD [facility] [phone]

## 2024-01-26 ENCOUNTER — Telehealth: Payer: Self-pay | Admitting: *Deleted

## 2024-01-26 NOTE — Telephone Encounter (Signed)
 Cardiac Catheterization scheduled at [facility] for: Wednesday January 28, 2024 11:30 AM Arrival time [facility] Main Entrance A at: 9:30 AM  Diet: -Nothing to eat after midnight.  Hydration: -May drink clear liquids until 2 hours before the procedure.  Approved liquids: Water, clear tea, black coffee, fruit juices-non-citric and without pulp,Gatorade, plain Jello/popsicles.   -Please drink 16 oz of water 2 hours before procedure.  Medication instructions: -Hold:  Actos /chlorthalidone /lasix /KCl/Valsartan /Mobic -AM of procedure-GFR 59 -Other usual morning medications can be taken including aspirin  81 mg.  Plan to go home the same day, you will only stay overnight if medically necessary.  You must have responsible adult to drive you home.  Someone must be with you the first 24 hours after you arrive home.  Reviewed procedure instructions with patient.

## 2024-01-27 DIAGNOSIS — M5126 Other intervertebral disc displacement, lumbar region: Secondary | ICD-10-CM | POA: Diagnosis not present

## 2024-01-27 DIAGNOSIS — M48061 Spinal stenosis, lumbar region without neurogenic claudication: Secondary | ICD-10-CM | POA: Diagnosis not present

## 2024-01-27 DIAGNOSIS — M48062 Spinal stenosis, lumbar region with neurogenic claudication: Secondary | ICD-10-CM | POA: Diagnosis not present

## 2024-01-28 ENCOUNTER — Ambulatory Visit (HOSPITAL_COMMUNITY)
Admission: RE | Admit: 2024-01-28 | Discharge: 2024-01-28 | Disposition: A | Discharge status: Home / Self Care | Attending: Cardiology | Admitting: Cardiology

## 2024-01-28 ENCOUNTER — Ambulatory Visit: Payer: Self-pay | Admitting: Family Medicine

## 2024-01-28 ENCOUNTER — Encounter (HOSPITAL_COMMUNITY)
Admission: RE | Disposition: A | Discharge status: Home / Self Care | Payer: Self-pay | Source: Home / Self Care | Attending: Cardiology

## 2024-01-28 DIAGNOSIS — Z7982 Long term (current) use of aspirin: Secondary | ICD-10-CM | POA: Diagnosis not present

## 2024-01-28 DIAGNOSIS — R0609 Other forms of dyspnea: Secondary | ICD-10-CM | POA: Insufficient documentation

## 2024-01-28 DIAGNOSIS — I350 Nonrheumatic aortic (valve) stenosis: Secondary | ICD-10-CM | POA: Diagnosis not present

## 2024-01-28 DIAGNOSIS — Z953 Presence of xenogenic heart valve: Secondary | ICD-10-CM | POA: Insufficient documentation

## 2024-01-28 DIAGNOSIS — I2510 Atherosclerotic heart disease of native coronary artery without angina pectoris: Secondary | ICD-10-CM | POA: Diagnosis not present

## 2024-01-28 DIAGNOSIS — I25119 Atherosclerotic heart disease of native coronary artery with unspecified angina pectoris: Secondary | ICD-10-CM

## 2024-01-28 DIAGNOSIS — E1122 Type 2 diabetes mellitus with diabetic chronic kidney disease: Secondary | ICD-10-CM | POA: Insufficient documentation

## 2024-01-28 DIAGNOSIS — N182 Chronic kidney disease, stage 2 (mild): Secondary | ICD-10-CM | POA: Insufficient documentation

## 2024-01-28 DIAGNOSIS — I130 Hypertensive heart and chronic kidney disease with heart failure and stage 1 through stage 4 chronic kidney disease, or unspecified chronic kidney disease: Secondary | ICD-10-CM | POA: Diagnosis not present

## 2024-01-28 DIAGNOSIS — K219 Gastro-esophageal reflux disease without esophagitis: Secondary | ICD-10-CM | POA: Insufficient documentation

## 2024-01-28 DIAGNOSIS — G4733 Obstructive sleep apnea (adult) (pediatric): Secondary | ICD-10-CM | POA: Insufficient documentation

## 2024-01-28 DIAGNOSIS — Z79899 Other long term (current) drug therapy: Secondary | ICD-10-CM | POA: Insufficient documentation

## 2024-01-28 DIAGNOSIS — I5032 Chronic diastolic (congestive) heart failure: Secondary | ICD-10-CM | POA: Insufficient documentation

## 2024-01-28 DIAGNOSIS — E782 Mixed hyperlipidemia: Secondary | ICD-10-CM | POA: Diagnosis not present

## 2024-01-28 DIAGNOSIS — R079 Chest pain, unspecified: Secondary | ICD-10-CM | POA: Diagnosis not present

## 2024-01-28 DIAGNOSIS — Z7984 Long term (current) use of oral hypoglycemic drugs: Secondary | ICD-10-CM | POA: Insufficient documentation

## 2024-01-28 HISTORY — PX: LEFT HEART CATH AND CORONARY ANGIOGRAPHY: CATH118249

## 2024-01-28 LAB — GLUCOSE, CAPILLARY: Glucose-Capillary: 127 mg/dL — ABNORMAL HIGH (ref 70–99)

## 2024-01-28 SURGERY — LEFT HEART CATH AND CORONARY ANGIOGRAPHY
Anesthesia: LOCAL

## 2024-01-28 MED ORDER — ONDANSETRON HCL 4 MG/2ML IJ SOLN: 4.0000 mg | Freq: Four times a day (QID) | INTRAMUSCULAR | Status: DC | PRN

## 2024-01-28 MED ORDER — ACETAMINOPHEN 325 MG PO TABS: 650.0000 mg | ORAL_TABLET | ORAL | Status: DC | PRN

## 2024-01-28 MED ORDER — MIDAZOLAM HCL 2 MG/2ML IJ SOLN
INTRAMUSCULAR | Status: DC | PRN
  Administered 2024-01-28: 2 mg via INTRAVENOUS

## 2024-01-28 MED ORDER — VERAPAMIL HCL 2.5 MG/ML IV SOLN
INTRAVENOUS | Status: AC
  Filled 2024-01-28: qty 2

## 2024-01-28 MED ORDER — ASPIRIN 81 MG PO CHEW: 81.0000 mg | CHEWABLE_TABLET | ORAL | Status: DC

## 2024-01-28 MED ORDER — LIDOCAINE HCL (PF) 1 % IJ SOLN
INTRAMUSCULAR | Status: DC | PRN
  Administered 2024-01-28: 2 mL via INTRADERMAL

## 2024-01-28 MED ORDER — SODIUM CHLORIDE 0.9% FLUSH: 3.0000 mL | INTRAVENOUS | Status: DC | PRN

## 2024-01-28 MED ORDER — HEPARIN SODIUM (PORCINE) 1000 UNIT/ML IJ SOLN
INTRAMUSCULAR | Status: AC
  Filled 2024-01-28: qty 10

## 2024-01-28 MED ORDER — HEPARIN SODIUM (PORCINE) 1000 UNIT/ML IJ SOLN
INTRAMUSCULAR | Status: DC | PRN
  Administered 2024-01-28: 4000 [IU] via INTRAVENOUS

## 2024-01-28 MED ORDER — MIDAZOLAM HCL 2 MG/2ML IJ SOLN
INTRAMUSCULAR | Status: AC
  Filled 2024-01-28: qty 2

## 2024-01-28 MED ORDER — SODIUM CHLORIDE 0.9% FLUSH: 3.0000 mL | Freq: Two times a day (BID) | INTRAVENOUS | Status: DC

## 2024-01-28 MED ORDER — FENTANYL CITRATE (PF) 100 MCG/2ML IJ SOLN
INTRAMUSCULAR | Status: AC
  Filled 2024-01-28: qty 2

## 2024-01-28 MED ORDER — SODIUM CHLORIDE 0.9 % IV SOLN: 250.0000 mL | INTRAVENOUS | Status: DC | PRN

## 2024-01-28 MED ORDER — HEPARIN (PORCINE) IN NACL 1000-0.9 UT/500ML-% IV SOLN
INTRAVENOUS | Status: DC | PRN
  Administered 2024-01-28 (×2): 500 mL

## 2024-01-28 MED ORDER — FREE WATER: 500.0000 mL | Freq: Once | Status: DC

## 2024-01-28 MED ORDER — FENTANYL CITRATE (PF) 100 MCG/2ML IJ SOLN
INTRAMUSCULAR | Status: DC | PRN
  Administered 2024-01-28: 25 ug via INTRAVENOUS

## 2024-01-28 MED ORDER — IOHEXOL 350 MG/ML SOLN
INTRAVENOUS | Status: DC | PRN
  Administered 2024-01-28: 25 mL

## 2024-01-28 MED ORDER — SODIUM CHLORIDE 0.9% FLUSH
3.0000 mL | Freq: Two times a day (BID) | INTRAVENOUS | Status: DC
Start: 2024-01-28 — End: 2024-01-28

## 2024-01-28 MED ORDER — LIDOCAINE HCL (PF) 1 % IJ SOLN
INTRAMUSCULAR | Status: AC
  Filled 2024-01-28: qty 30

## 2024-01-28 MED ORDER — VERAPAMIL HCL 2.5 MG/ML IV SOLN
INTRAVENOUS | Status: DC | PRN
  Administered 2024-01-28: 10 mL via INTRA_ARTERIAL

## 2024-01-28 SURGICAL SUPPLY — 7 items
CATH INFINITI AMBI 5FR TG (CATHETERS) IMPLANT
DEVICE RAD COMP TR BAND LRG (VASCULAR PRODUCTS) IMPLANT
GLIDESHEATH SLEND A-KIT 6F 22G (SHEATH) IMPLANT
GUIDEWIRE INQWIRE 1.5J.035X260 (WIRE) IMPLANT
KIT SINGLE USE MANIFOLD (KITS) IMPLANT
PACK CARDIAC CATHETERIZATION (CUSTOM PROCEDURE TRAY) ×1 IMPLANT
SET ATX-X65L (MISCELLANEOUS) IMPLANT

## 2024-01-28 NOTE — Discharge Instructions (Signed)

## 2024-01-28 NOTE — Progress Notes (Signed)
 TR Band removed no s/s of complications at incision site. PT ambulated in the hallway with nurse, was able to void in the bathroom without difficulty. Discharge instructions reviewed with patient and husband Eddie at bedside. Verbalized understanding. PT was escorted from the unit via wheelchair to personal vehicle.

## 2024-01-28 NOTE — Interval H&P Note (Signed)
 History and Physical Interval Note:  01/28/2024 12:07 PM  Megan Galvan  has presented today for surgery, with the diagnosis of chest pain.  The various methods of treatment have been discussed with the patient and family. After consideration of risks, benefits and other options for treatment, the patient has consented to  Procedure(s): LEFT HEART CATH AND CORONARY ANGIOGRAPHY (N/A) and possible coronary angioplasty for chest pain, dyspnea on exertion and history of aortic valve replacement in 2022 as a surgical intervention.  The patient's history has been reviewed, patient examined, no change in status, stable for surgery.  I have reviewed the patient's chart and labs.  Questions were answered to the patient's satisfaction.     Gordy Bergamo

## 2024-01-29 ENCOUNTER — Telehealth: Payer: Self-pay

## 2024-01-29 ENCOUNTER — Encounter (HOSPITAL_COMMUNITY): Payer: Self-pay | Admitting: Cardiology

## 2024-01-29 NOTE — Telephone Encounter (Signed)
 Pt c/o medication issue:  1. Name of Medication: aspirin  EC 81 MG tablet   2. How are you currently taking this medication (dosage and times per day)? N/A  3. Are you having a reaction (difficulty breathing--STAT)? No   4. What is your medication issue? Pt was told to stop this medication and would like to make sure this is okay with her Cardiologist.

## 2024-01-30 ENCOUNTER — Other Ambulatory Visit: Payer: Self-pay

## 2024-01-30 ENCOUNTER — Ambulatory Visit: Payer: Self-pay

## 2024-01-30 ENCOUNTER — Emergency Department (HOSPITAL_COMMUNITY)
Admission: EM | Admit: 2024-01-30 | Discharge: 2024-01-31 | Disposition: A | Discharge status: Home / Self Care | Attending: Emergency Medicine | Admitting: Emergency Medicine

## 2024-01-30 ENCOUNTER — Telehealth: Payer: Self-pay | Admitting: Nurse Practitioner

## 2024-01-30 ENCOUNTER — Encounter (HOSPITAL_COMMUNITY): Payer: Self-pay | Admitting: *Deleted

## 2024-01-30 DIAGNOSIS — S60211A Contusion of right wrist, initial encounter: Secondary | ICD-10-CM | POA: Insufficient documentation

## 2024-01-30 DIAGNOSIS — I5030 Unspecified diastolic (congestive) heart failure: Secondary | ICD-10-CM | POA: Diagnosis not present

## 2024-01-30 DIAGNOSIS — E119 Type 2 diabetes mellitus without complications: Secondary | ICD-10-CM | POA: Diagnosis not present

## 2024-01-30 DIAGNOSIS — G8918 Other acute postprocedural pain: Secondary | ICD-10-CM | POA: Diagnosis not present

## 2024-01-30 DIAGNOSIS — Z91040 Latex allergy status: Secondary | ICD-10-CM | POA: Diagnosis not present

## 2024-01-30 DIAGNOSIS — D649 Anemia, unspecified: Secondary | ICD-10-CM | POA: Insufficient documentation

## 2024-01-30 DIAGNOSIS — X58XXXA Exposure to other specified factors, initial encounter: Secondary | ICD-10-CM | POA: Diagnosis not present

## 2024-01-30 DIAGNOSIS — R2230 Localized swelling, mass and lump, unspecified upper limb: Secondary | ICD-10-CM | POA: Diagnosis present

## 2024-01-30 DIAGNOSIS — F03A0 Unspecified dementia, mild, without behavioral disturbance, psychotic disturbance, mood disturbance, and anxiety: Secondary | ICD-10-CM | POA: Insufficient documentation

## 2024-01-30 LAB — BASIC METABOLIC PANEL WITH GFR
Anion gap: 9 (ref 5–15)
BUN: 12 mg/dL (ref 8–23)
CO2: 30 mmol/L (ref 22–32)
Calcium: 9.5 mg/dL (ref 8.9–10.3)
Chloride: 100 mmol/L (ref 98–111)
Creatinine, Ser: 0.95 mg/dL (ref 0.44–1.00)
GFR, Estimated: >60 mL/min (ref >60)
Glucose, Bld: 151 mg/dL — ABNORMAL HIGH (ref 70–99)
Potassium: 3.6 mmol/L (ref 3.5–5.1)
Sodium: 139 mmol/L (ref 135–145)

## 2024-01-30 LAB — CBC
HCT: 36 % (ref 36.0–46.0)
Hemoglobin: 11.9 g/dL — ABNORMAL LOW (ref 12.0–15.0)
MCH: 32.4 pg (ref 26.0–34.0)
MCHC: 33.1 g/dL (ref 30.0–36.0)
MCV: 98.1 fL (ref 80.0–100.0)
Platelets: 227 K/uL (ref 150–400)
RBC: 3.67 MIL/uL — ABNORMAL LOW (ref 3.87–5.11)
RDW: 13.4 % (ref 11.5–15.5)
WBC: 7.7 K/uL (ref 4.0–10.5)
nRBC: 0 % (ref 0.0–0.2)

## 2024-01-30 NOTE — ED Provider Notes (Signed)
 [place] EMERGENCY DEPARTMENT AT [facility] Provider Note   CSN: [id number] Arrival date & time: 01/30/24  1931     Patient presents with: Post-op Problem and Arm Swelling  Megan Galvan is a 73 y.o. female who is 2 days postop from left heart cath with right radial access site.  She states that this evening her husband removed the pressure dressing from her right wrist for her to shower.  She already had significant bruising to the right wrist but noticed a pea-sized area of swelling, up that slowly enlarged to approximately the size of a nickel prompting ED follow-up.  No acute pain, no numbness, tingling, weakness in the hand.  No changes in color of the hand.  She was previously on 81 mg of aspirin  daily which she was told to hold after her procedure pending cardiology follow-up.  Patient well-appearing without any chest pain shortness of breath or near syncopal symptoms.  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  Paroxysmal A-fib.    HPI     Prior to Admission medications   Medication Sig Start Date End Date Taking? Authorizing Provider  ACCU-CHEK GUIDE TEST test strip USE 1 STRIP WITH METER DAILY IN  THE AFTERNOON 07/15/23   Sirivol, Mamatha, MD  Accu-Chek Softclix Lancets lancets CHECK BLOOD SUGAR DAILY 08/03/23   Sirivol, Mamatha, MD  acetaminophen  (TYLENOL ) 500 MG tablet Take 1,000 mg by mouth every 8 (eight) hours as needed for mild pain (pain score 1-3), moderate pain (pain score 4-6) or headache.    [provider]  AMBULATORY NON FORMULARY MEDICATION Diltiazem  2% compounded with lidocaine  5% ointment applied to the rectum 3 times daily for 8 weeks. Patient not taking: Reported on 01/23/2024 04/10/23   Charlanne Groom, MD  AMBULATORY NON FORMULARY MEDICATION Nitroglycerine ointment 0.125 %. Apply a pea sized amount internally four times daily. Patient not taking: Reported on  01/23/2024 06/20/23   Charlanne Groom, MD  Blood Glucose Monitoring Suppl (ACCU-CHEK GUIDE ME) w/Device KIT Use as directed 09/19/20   CoxAbigail, MD  Calcium Carb-Cholecalciferol (CALCIUM 600 + D PO) Take 2 tablets by mouth daily.    [provider]  cetirizine (ZYRTEC) 10 MG tablet Take 10 mg by mouth daily.    [provider]  chlorthalidone  (HYGROTON ) 25 MG tablet Take 1 tablet (25 mg total) by mouth daily. 09/08/23 03/06/24  Sirivol, Mamatha, MD  clotrimazole -betamethasone  (LOTRISONE ) cream Apply 1 Application topically daily. Patient not taking: Reported on 01/23/2024 07/01/23   CoxAbigail, MD  famotidine  (PEPCID ) 40 MG tablet TAKE 1 TABLET BY MOUTH AT  BEDTIME 06/12/23   Cox, Abigail, MD  fluticasone  (FLONASE ) 50 MCG/ACT nasal spray Place 1 spray into the nose daily as needed for allergies.    [provider]  furosemide  (LASIX ) 40 MG tablet Take 40 mg (1 tablet) daily for 3 days then take 40 mg (1 tablet) every other day Patient taking differently: Take 40 mg by mouth in the morning. 01/14/24   Madireddy, Alean SAUNDERS, MD  Glycerin-Hypromellose-PEG 400 (DRY EYE RELIEF DROPS) 0.2-0.2-1 % SOLN Place 1 drop into both eyes daily as needed (Dry eyes).    [provider]  hydrocortisone  (ANUSOL -HC) 25 MG suppository Place 1 suppository (25 mg total) rectally 2 (two) times daily. Patient taking differently: Place 25 mg rectally 2 (two) times daily as needed for hemorrhoids or anal itching. 12/04/23   Sherre Abigail, MD  Magnesium  250 MG CAPS Take 250 mg by mouth in the morning.    [provider]  Melatonin 5 MG CAPS Take 5 mg by mouth at bedtime.    [provider]  Multiple Vitamin (MULTIVITAMIN WITH MINERALS) TABS tablet Take 1 tablet by mouth daily.    [provider]  Multiple Vitamins-Minerals (HAIR SKIN & NAILS) TABS Take 1 tablet by mouth in the morning.    [provider]  nystatin  (MYCOSTATIN /NYSTOP ) powder Apply 1 Application  topically 3 (three) times daily. 12/19/23   CoxAbigail, MD  nystatin  ointment (MYCOSTATIN ) Apply 1 Application topically 2 (two) times daily as needed (yeast / itching). Mix with triamcinolone     [provider]  omeprazole  (PRILOSEC) 40 MG capsule TAKE 1 CAPSULE BY MOUTH TWICE  DAILY BEFORE MEALS Patient taking differently: Take 40 mg by mouth in the morning. 12/16/23   CoxAbigail, MD  oxyCODONE -acetaminophen  (PERCOCET/ROXICET) 5-325 MG tablet Take 1 tablet by mouth every 4 (four) hours as needed for moderate pain (pain score 4-6) or severe pain (pain score 7-10). 01/22/24   [provider]  pioglitazone  (ACTOS ) 30 MG tablet TAKE 1 TABLET BY MOUTH DAILY 09/22/23   Cox, Kirsten, MD  potassium chloride  SA (KLOR-CON  M) 20 MEQ tablet TAKE 2 TABLETS BY MOUTH TWICE  DAILY 10/26/23   Cox, Kirsten, MD  pravastatin  (PRAVACHOL ) 40 MG tablet TAKE 1 TABLET BY MOUTH AT  BEDTIME 10/05/23   Cox, Kirsten, MD  pregabalin  (LYRICA ) 150 MG capsule Take 1 capsule (150 mg total) by mouth 3 (three) times daily. Patient not taking: Reported on 01/23/2024 12/31/23   Nicholaus Credit, PA-C  PREMARIN  vaginal cream APPLY TWICE A week. Patient not taking: Reported on 01/23/2024 05/20/23   Sherre Abigail, MD  PRESCRIPTION MEDICATION Place 1 Application rectally daily as needed (Hemorrhoids). NIFEDIPINE 5% ointment Patient not taking: Reported on 01/23/2024    [provider]  traZODone  (DESYREL ) 100 MG tablet TAKE 1 TABLET BY MOUTH BEFORE  BEDTIME 01/18/24   Cox, Kirsten, MD  triamcinolone  ointment (KENALOG ) 0.1 % Apply 1 Application topically 2 (two) times daily as needed (yeast / itching). Mix with nystatin     [provider]  valsartan  (DIOVAN ) 320 MG tablet TAKE 1 TABLET BY MOUTH DAILY 10/26/23   Cox, Kirsten, MD    Allergies: Ace inhibitors, Farxiga [dapagliflozin], Jardiance [empagliflozin], Keflex [cephalexin], Latex, Doxycycline, Metformin and related, Nexium [esomeprazole magnesium ], and Protonix   [pantoprazole  sodium]    Review of Systems  Musculoskeletal:        R wrist bruising and swelling at site of [facility] cath on 10/1.     Updated Vital Signs BP (!) 133/57 (BP Location: Right Arm)   Pulse 77   Temp 98.4 F (36.9 C) (Oral)   Resp 18   Ht 5' 5 (1.651 m)   Wt 98.9 kg   SpO2 99%   BMI 36.28 kg/m   Physical Exam Vitals and nursing note reviewed.  Constitutional:      Appearance: She is not ill-appearing or toxic-appearing.  HENT:     Head: Normocephalic and atraumatic.  Eyes:     General: No scleral icterus.       Right eye: No discharge.        Left eye: No discharge.     Conjunctiva/sclera: Conjunctivae normal.  Cardiovascular:     Heart sounds: Normal heart sounds. No murmur heard. Pulmonary:     Effort: Pulmonary effort is normal.     Breath sounds: Normal breath  sounds.  Abdominal:     Palpations: Abdomen is soft.  Musculoskeletal:       Arms:     Comments: Symmetric strength in grip between left and right hands.   Skin:    General: Skin is warm and dry.     Capillary Refill: Capillary refill takes less than 2 seconds.  Neurological:     General: No focal deficit present.     Mental Status: She is alert and oriented to person, place, and time.     Sensory: Sensation is intact.     Motor: Motor function is intact.     Coordination: Coordination is intact.  Psychiatric:        Mood and Affect: Mood normal.     (all labs ordered are listed, but only abnormal results are displayed) Labs Reviewed  BASIC METABOLIC PANEL WITH GFR - Abnormal; Notable for the following components:      Result Value   Glucose, Bld 151 (*)    All other components within normal limits  CBC - Abnormal; Notable for the following components:   RBC 3.67 (*)    Hemoglobin 11.9 (*)    All other components within normal limits    EKG: None  Radiology: No results found.   Procedures   Medications Ordered in the ED - No data to display                                   Medical Decision Making 73 year old female who presents with concern for bruising and swelling to the right wrist 2 days after left heart cath with right radial arterial access point.  Mildly hypertensive on intake, vital signs otherwise reassuring.  Normal neurovascular status of the right hand with some bruising and very mild soft tissue swelling on the right volar wrist without evidence of obvious hematoma.  Amount and/or Complexity of Data Reviewed Labs: ordered.    Details: CBC with mild anemia with hemoglobin 11.9.  BMP unremarkable.   Clinical picture most consistent with and precipitated postprocedural bruising; may have very small hematoma from small arterial leak following removal of pressure dressing, however patient has been in the emergency department for 4 hours from time of arrival, primarily in the waiting room and according to family has not had any expansion of the swelling initially noted.  Clinical concern for emergent underlying condition that would warrant further ED workup and patient management is exceedingly low.  Recommend close outpatient follow-up with her cardiologist as previously discussed.  Atoya and her husband voiced understanding of her medical evaluation and treatment plan. Each of their questions answered to their expressed satisfaction.  Return precautions were given.  Patient is well-appearing, stable, and was discharged in good condition.  This chart was dictated using voice recognition software, Dragon. Despite the best efforts of this provider to proofread and correct errors, errors may still occur which can change documentation meaning.      Final diagnoses:  None    ED Discharge Orders     None          Bobette Pleasant SAUNDERS, PA-C 01/30/24 2357    Ruthe Cornet, DO 02/02/24 [phone]

## 2024-01-30 NOTE — ED Triage Notes (Signed)
 Patient had cardiac cath on Wednesday, right radial access, now site is swollen and purple and patient reports swelling is getting worse today.

## 2024-01-30 NOTE — Telephone Encounter (Signed)
 FYI Only or Action Required?: FYI only for provider.  Patient was last seen in primary care on 01/23/2024 by Sirivol, Mamatha, MD.  Called Nurse Triage reporting Knee Pain.  Symptoms began On going.  Interventions attempted: Rest, hydration, or home remedies.  Symptoms are: gradually worsening.  Triage Disposition: See PCP When Office is Open (Within 3 Days)  Patient/caregiver understands and will follow disposition?: Yes Reason for Disposition  [1] MODERATE pain (e.g., interferes with normal activities, limping) AND [2] present > 3 days  Answer Assessment - Initial Assessment Questions Patient calling due to worsening pain in right knee, stated Dr. Sirivol would see her Monday to discuss injection.  1. LOCATION and RADIATION: Where is the pain located?      Right knee   2. QUALITY: What does the pain feel like?  (e.g., sharp, dull, aching, burning)     Aching all the time, could barely bend knee this morning to get dressed  3. SEVERITY: How bad is the pain? What does it keep you from doing?   (Scale 1-10; or mild, moderate, severe)     9/10  4. ONSET: When did the pain start? Does it come and go, or is it there all the time?     Quite some time  5. RECURRENT: Have you had this pain before? If Yes, ask: When, and what happened then?     Yes  Protocols used: Knee Pain-A-AH  Copied from CRM [id number]. Topic: Clinical - Red Word Triage >> Jan 30, 2024  2:30 PM Wess RAMAN wrote: Red Word that prompted transfer to Nurse Triage: Patient states she spoke with Dr. Sirivol about getting a shot her right knee. Pain level is 9. Hurts when sitting and standing, causing difficulty walking. Pt would like an appt for injection

## 2024-01-30 NOTE — Discharge Instructions (Signed)
 You are seen in the ER today for the bruising and swelling to right wrist.  These are anticipated effects after your procedure 2 days ago.  Your wrist was rewrapped in the emergency department.  You may remove this on Monday.  Please follow-up with your cardiologist outpatient as previously scheduled and return to the ER with any new severe symptoms.

## 2024-01-30 NOTE — ED Triage Notes (Addendum)
 Pt reports she had a heart cath on Wednesday. She says she has increased swelling over the past 3-4 hours in her right lower forearm area with bruising, and a small knot in her wrist area. Good palpable pulses, extremity warm <3 sec cap refil. Denies new cp or sob

## 2024-01-30 NOTE — Telephone Encounter (Signed)
   Pts husband called to report that pt is s/p cath via R radial today and they just noticed that she has a pea sized area of swelling approx 0.5 inches above the incision site.  While on the phone w/ him, Ms. Leonhardt informed him that the swelling is getting worse.  I advised that they apply direct manual pressure and call 911 for transfer to the ED for further evaluation.  Caller verbalized understanding and was grateful for the call back.  Lonni Meager, NP 01/30/2024, 6:49 PM

## 2024-01-30 NOTE — ED Notes (Signed)
 Assuming pt care, pt walk in for arm swelling/bruising since catheter procedure, pt aaox4, ambulatory, bruising noted to right wrist. Pt assessed by PA, family at bedside

## 2024-02-02 ENCOUNTER — Ambulatory Visit (INDEPENDENT_AMBULATORY_CARE_PROVIDER_SITE_OTHER)

## 2024-02-02 VITALS — BP 136/82 | HR 78 | Temp 97.6°F | Ht 65.0 in | Wt 218.6 lb

## 2024-02-02 DIAGNOSIS — M25561 Pain in right knee: Secondary | ICD-10-CM | POA: Insufficient documentation

## 2024-02-02 DIAGNOSIS — I10 Essential (primary) hypertension: Secondary | ICD-10-CM | POA: Diagnosis not present

## 2024-02-02 HISTORY — DX: Pain in right knee: M25.561

## 2024-02-02 MED ORDER — CHLORTHALIDONE 25 MG PO TABS: 25.0000 mg | ORAL_TABLET | Freq: Every day | ORAL | 1 refills | Status: DC

## 2024-02-02 NOTE — Progress Notes (Signed)
 Acute Office Visit  Subjective:    Patient ID: Megan Galvan, female    DOB: 12-08-1950, 73 y.o.   MRN: [id number]  Chief Complaint  Patient presents with   Knee Pain    HPI: Discussed the use of AI scribe software for clinical note transcription with the patient, who gave verbal consent to proceed.  History of Present Illness   Megan Galvan is a 73 year old female who presents with persistent right knee pain.  Right knee pain - Persistent right knee pain for approximately one month - Pain is constant and severe, primarily located on the medial aspect but affects the entire knee - Pain persists regardless of position, including sitting and lying down - Ambulates with a limp - No pain in the posterior aspect of the knee - X-ray of the right knee performed on September 26th which showed mild OA - Initial presentation was for right calf pain at urgent care - Pain significantly impacts daily activities  Analgesic use and side effects - Uses oxycodone  for pain management - Reduces oxycodone  dose by half due to constipation  Recent cardiac catheterization and post-procedural symptoms - Underwent cardiac catheterization via right radial artery on October 1st for chest pain and exertional dyspnea - No coronary blockages identified; no stents placed - Developed right forearm swelling post-procedure - Blood clot was ruled out  Laboratory findings - Blood work on October 3rd showed mild anemia with hemoglobin of 11.9 g/dL - Other laboratory results within normal limits - Hemoglobin A1c was 7 in July        Past Medical History:  Diagnosis Date   Acute cystitis without hematuria 06/21/2022   Allergic sinusitis 08/27/2022   Atrophic vaginitis 03/31/2021   Candidiasis, intertrigo 12/21/2023   Cataract    Chronic diastolic CHF (congestive heart failure), NYHA class 2 (HCC) 11/17/2023   Class 1 obesity due to excess calories with serious comorbidity and body mass index  (BMI) of 33.0 to 33.9 in adult 03/28/2022   Class 1 obesity due to excess calories with serious comorbidity and body mass index (BMI) of 34.0 to 34.9 in adult 03/28/2022   Class 2 severe obesity with serious comorbidity and body mass index (BMI) of 37.0 to 37.9 in adult 06/07/2019   Dysuria 04/07/2021   Encounter for osteoporosis screening in asymptomatic postmenopausal patient 06/28/2022   Essential hypertension 05/20/2023   External hemorrhoids 11/12/2021   Gastro-esophageal reflux disease without esophagitis 06/03/2019   History of kidney stones    Joint pain 05/20/2023   Mixed hyperlipidemia    Mucositis (ulcerative) of vagina and vulva 06/21/2022   Myalgia 06/07/2019   Neck pain, chronic 10/18/2022   Need for hepatitis C screening test 11/23/2023   Numbness in right leg 09/08/2023   Pain in both hands 02/08/2023   Paresthesias 09/04/2021   Paroxysmal atrial fibrillation (HCC) 11/23/2023   Primary insomnia 06/03/2019   Rectal burning 12/03/2022   Rectal itching 12/03/2022   Rectal pain 09/04/2021   S/P aortic valve replacement with bioprosthetic valve 08/10/2020   21 mm Edwards Resilia Inspiria Bioprosthetic Tissue Valve  SN [id number] Model 11500A   S/P cervical spinal fusion 02/19/2023   Sleep apnea    cpap   Type 2 diabetes mellitus with diabetic polyneuropathy, without long-term current use of insulin  (HCC) 06/03/2019   Venous insufficiency of both lower extremities 09/08/2023    Past Surgical History:  Procedure Laterality Date   ANTERIOR CERVICAL DECOMP/DISCECTOMY FUSION     x 2  ANTERIOR CERVICAL DECOMP/DISCECTOMY FUSION N/A 02/19/2023   Procedure: Anterior Cervical Discectomy Fusion-Cervical Three-Cervical Four, Removal Plate Cervical Four-Cervical Five;  Surgeon: Joshua Alm Hamilton, MD;  Location: [facility] OR;  Service: Neurosurgery;  Laterality: N/A;  3C   AORTIC VALVE REPLACEMENT N/A 08/10/2020   Procedure: AORTIC VALVE REPLACEMENT (AVR) USING INSPIRIS VALVE SIZE ;   Surgeon: Dusty Sudie DEL, MD;  Location: [facility] OR;  Service: Open Heart Surgery;  Laterality: N/A;   BACK SURGERY  11/17/2016   L3-L5   BILATERAL CARPAL TUNNEL RELEASE     bone spur Left    left shoulder   bone spur Right    Right shoulder   CATARACT EXTRACTION Left 09/18/2021   COLONOSCOPY  08/22/2011   Moderate predominantly sigmoid diverticulosis. Small internal hemorrhoids.    COLONOSCOPY     HEMORROIDECTOMY  02/2019   LEFT HEART CATH AND CORONARY ANGIOGRAPHY N/A 01/28/2024   Procedure: LEFT HEART CATH AND CORONARY ANGIOGRAPHY;  Surgeon: Ladona Heinz, MD;  Location: [facility] INVASIVE CV LAB;  Service: Cardiovascular;  Laterality: N/A;   RIGHT/LEFT HEART CATH AND CORONARY ANGIOGRAPHY N/A 07/27/2020   Procedure: RIGHT/LEFT HEART CATH AND CORONARY ANGIOGRAPHY;  Surgeon: Wonda Sharper, MD;  Location: [facility] INVASIVE CV LAB;  Service: Cardiovascular;  Laterality: N/A;   TEE WITHOUT CARDIOVERSION N/A 08/10/2020   Procedure: TRANSESOPHAGEAL ECHOCARDIOGRAM (TEE);  Surgeon: Dusty Sudie DEL, MD;  Location: [facility] OR;  Service: Open Heart Surgery;  Laterality: N/A;   torn rotator cuff Right    Right shoulder   torn rotator cuff Left    left shoulder   TRIGGER FINGER RELEASE Left    thumb   TRIGGER FINGER RELEASE Right    thumb and middle finger   UPPER GASTROINTESTINAL ENDOSCOPY     WRIST SURGERY Left    torn ligament   WRIST SURGERY Right    cyst    Family History  Problem Relation Age of Onset   Diabetes Mother    Heart Problems Mother    Colon cancer Brother 4   Heart Problems Maternal Grandfather    Colon cancer Paternal Grandmother    Esophageal cancer Neg Hx    Rectal cancer Neg Hx    Stomach cancer Neg Hx    Breast cancer Neg Hx     Social History   Socioeconomic History   Marital status: Married    Spouse name: Eddie   Number of children: 2   Years of education: Not on file   Highest education level: Not on file  Occupational History   Not on file  Tobacco Use   Smoking  status: Never   Smokeless tobacco: Never  Vaping Use   Vaping status: Never Used  Substance and Sexual Activity   Alcohol use: Never   Drug use: Never   Sexual activity: Yes    Birth control/protection: Post-menopausal  Other Topics Concern   Not on file  Social History Narrative   One son lives in home, the other son lives about 3 miles away   Social Drivers of Health   Financial Resource Strain: Low Risk  (09/25/2023)   Overall Financial Resource Strain (CARDIA)    Difficulty of Paying Living Expenses: Not hard at all  Food Insecurity: No Food Insecurity (09/25/2023)   Hunger Vital Sign    Worried About Running Out of Food in the Last Year: Never true    Ran Out of Food in the Last Year: Never true  Transportation Needs: No Transportation Needs (09/25/2023)   PRAPARE -  Administrator, Civil Service (Medical): No    Lack of Transportation (Non-Medical): No  Physical Activity: Inactive (09/25/2023)   Exercise Vital Sign    Days of Exercise per Week: 0 days    Minutes of Exercise per Session: 0 min  Stress: No Stress Concern Present (09/25/2023)   [employer] of Occupational Health - Occupational Stress Questionnaire    Feeling of Stress : Not at all  Social Connections: Moderately Integrated (09/25/2023)   Social Connection and Isolation Panel    Frequency of Communication with Friends and Family: More than three times a week    Frequency of Social Gatherings with Friends and Family: More than three times a week    Attends Religious Services: More than 4 times per year    Active Member of [employer] or Organizations: No    Attends Banker Meetings: Never    Marital Status: Married  Catering manager Violence: Not At Risk (09/25/2023)   Humiliation, Afraid, Rape, and Kick questionnaire    Fear of Current or Ex-Partner: No    Emotionally Abused: No    Physically Abused: No    Sexually Abused: No    Outpatient Medications Prior to Visit  Medication Sig  Dispense Refill   ACCU-CHEK GUIDE TEST test strip USE 1 STRIP WITH METER DAILY IN  THE AFTERNOON 100 strip 2   Accu-Chek Softclix Lancets lancets CHECK BLOOD SUGAR DAILY 100 each 2   acetaminophen  (TYLENOL ) 500 MG tablet Take 1,000 mg by mouth every 8 (eight) hours as needed for mild pain (pain score 1-3), moderate pain (pain score 4-6) or headache.     AMBULATORY NON FORMULARY MEDICATION Diltiazem  2% compounded with lidocaine  5% ointment applied to the rectum 3 times daily for 8 weeks. 1 each 0   AMBULATORY NON FORMULARY MEDICATION Nitroglycerine ointment 0.125 %. Apply a pea sized amount internally four times daily. 30 g 2   Blood Glucose Monitoring Suppl (ACCU-CHEK GUIDE ME) w/Device KIT Use as directed 1 kit 0   Calcium Carb-Cholecalciferol (CALCIUM 600 + D PO) Take 2 tablets by mouth daily.     cetirizine (ZYRTEC) 10 MG tablet Take 10 mg by mouth daily.     clotrimazole -betamethasone  (LOTRISONE ) cream Apply 1 Application topically daily. 45 g 3   famotidine  (PEPCID ) 40 MG tablet TAKE 1 TABLET BY MOUTH AT  BEDTIME 100 tablet 2   fluticasone  (FLONASE ) 50 MCG/ACT nasal spray Place 1 spray into the nose daily as needed for allergies.     furosemide  (LASIX ) 40 MG tablet Take 40 mg (1 tablet) daily for 3 days then take 40 mg (1 tablet) every other day 48 tablet 3   Glycerin-Hypromellose-PEG 400 (DRY EYE RELIEF DROPS) 0.2-0.2-1 % SOLN Place 1 drop into both eyes daily as needed (Dry eyes).     hydrocortisone  (ANUSOL -HC) 25 MG suppository Place 1 suppository (25 mg total) rectally 2 (two) times daily. 30 suppository 2   Magnesium  250 MG CAPS Take 250 mg by mouth in the morning.     Melatonin 5 MG CAPS Take 5 mg by mouth at bedtime.     Multiple Vitamin (MULTIVITAMIN WITH MINERALS) TABS tablet Take 1 tablet by mouth daily.     Multiple Vitamins-Minerals (HAIR SKIN & NAILS) TABS Take 1 tablet by mouth in the morning.     nystatin  (MYCOSTATIN /NYSTOP ) powder Apply 1 Application topically 3 (three) times  daily. 60 g 3   omeprazole  (PRILOSEC) 40 MG capsule TAKE 1 CAPSULE BY MOUTH TWICE  DAILY BEFORE MEALS 200 capsule 2   oxyCODONE -acetaminophen  (PERCOCET/ROXICET) 5-325 MG tablet Take 1 tablet by mouth every 4 (four) hours as needed for moderate pain (pain score 4-6) or severe pain (pain score 7-10).     pioglitazone  (ACTOS ) 30 MG tablet TAKE 1 TABLET BY MOUTH DAILY 100 tablet 2   potassium chloride  SA (KLOR-CON  M) 20 MEQ tablet TAKE 2 TABLETS BY MOUTH TWICE  DAILY 400 tablet 2   pravastatin  (PRAVACHOL ) 40 MG tablet TAKE 1 TABLET BY MOUTH AT  BEDTIME 100 tablet 2   pregabalin  (LYRICA ) 150 MG capsule Take 1 capsule (150 mg total) by mouth 3 (three) times daily. 90 capsule 1   PRESCRIPTION MEDICATION Place 1 Application rectally daily as needed (Hemorrhoids). NIFEDIPINE 5% ointment     traZODone  (DESYREL ) 100 MG tablet TAKE 1 TABLET BY MOUTH BEFORE  BEDTIME 90 tablet 3   triamcinolone  ointment (KENALOG ) 0.1 % Apply 1 Application topically 2 (two) times daily as needed (yeast / itching). Mix with nystatin      valsartan  (DIOVAN ) 320 MG tablet TAKE 1 TABLET BY MOUTH DAILY 100 tablet 2   chlorthalidone  (HYGROTON ) 25 MG tablet Take 1 tablet (25 mg total) by mouth daily. 90 tablet 1   nystatin  ointment (MYCOSTATIN ) Apply 1 Application topically 2 (two) times daily as needed (yeast / itching). Mix with triamcinolone      PREMARIN  vaginal cream APPLY TWICE A week. 42.5 g 1   No facility-administered medications prior to visit.    Allergies  Allergen Reactions   Ace Inhibitors Cough   Farxiga [Dapagliflozin] Other (See Comments)    Yeast infections    Jardiance [Empagliflozin] Other (See Comments)    Yeast infections   Keflex [Cephalexin] Itching   Latex Other (See Comments)    Burn and itch   Doxycycline Rash   Metformin And Related Diarrhea   Nexium [Esomeprazole Magnesium ] Diarrhea   Protonix  [Pantoprazole  Sodium] Other (See Comments)    Constipation    Review of Systems  Constitutional:   Negative for chills, fatigue and fever.  HENT:  Negative for congestion, ear pain, sinus pressure and sore throat.   Respiratory:  Negative for cough and shortness of breath.   Cardiovascular:  Negative for chest pain.  Gastrointestinal:  Negative for abdominal pain, constipation, diarrhea, nausea and vomiting.  Genitourinary:  Negative for dysuria and frequency.  Musculoskeletal:  Positive for arthralgias. Negative for back pain and myalgias.       Right knee pain  Neurological:  Negative for dizziness and headaches.  Psychiatric/Behavioral:  Negative for dysphoric mood. The patient is not nervous/anxious.        Objective:        02/02/2024   10:17 AM 01/30/2024   11:55 PM 01/30/2024   10:58 PM  Vitals with BMI  Height 5' 5 5' 5   Weight 218 lbs 10 oz 218 lbs 1 oz   BMI 36.38 36.28   Systolic 136 129 866  Diastolic 82 72 57  Pulse 78 69 77    No data found.   Physical Exam Vitals and nursing note reviewed.  Constitutional:      Appearance: She is obese.  HENT:     Head: Normocephalic and atraumatic.  Cardiovascular:     Rate and Rhythm: Normal rate.     Heart sounds: Murmur (ejection systolic murmur) heard.  Pulmonary:     Effort: Pulmonary effort is normal.     Breath sounds: Normal breath sounds.  Musculoskeletal:  General: Swelling (mild right knee) and tenderness (medial right knee tenderness) present.     Comments: Slightly decreased flexion and extension at the right knee  Skin:    General: Skin is warm.     Findings: Bruising (right forearm from radial approach to her recent cath) present.     Comments: Varicose veins, spider veins noted  Neurological:     General: No focal deficit present.     Mental Status: She is alert and oriented to person, place, and time.     There are no preventive care reminders to display for this patient.  There are no preventive care reminders to display for this patient.   Lab Results  Component Value Date    TSH 2.100 02/04/2023   Lab Results  Component Value Date   WBC 7.7 01/30/2024   HGB 11.9 (L) 01/30/2024   HCT 36.0 01/30/2024   MCV 98.1 01/30/2024   PLT 227 01/30/2024   Lab Results  Component Value Date   NA 139 01/30/2024   K 3.6 01/30/2024   CO2 30 01/30/2024   GLUCOSE 151 (H) 01/30/2024   BUN 12 01/30/2024   CREATININE 0.95 01/30/2024   BILITOT 0.3 12/19/2023   ALKPHOS 73 12/19/2023   AST 29 12/19/2023   ALT 23 12/19/2023   PROT 6.7 12/19/2023   ALBUMIN  4.2 12/19/2023   CALCIUM 9.5 01/30/2024   ANIONGAP 9 01/30/2024   EGFR 59 (L) 01/19/2024   Lab Results  Component Value Date   CHOL 124 11/21/2023   Lab Results  Component Value Date   HDL 39 (L) 11/21/2023   Lab Results  Component Value Date   LDLCALC 63 11/21/2023   Lab Results  Component Value Date   TRIG 125 11/21/2023   Lab Results  Component Value Date   CHOLHDL 3.2 11/21/2023   Lab Results  Component Value Date   HGBA1C 7.0 (H) 11/21/2023        Results for orders placed or performed during the hospital encounter of 01/30/24  Basic metabolic panel   Collection Time: 01/30/24  8:28 PM  Result Value Ref Range   Sodium 139 135 - 145 mmol/L   Potassium 3.6 3.5 - 5.1 mmol/L   Chloride 100 98 - 111 mmol/L   CO2 30 22 - 32 mmol/L   Glucose, Bld 151 (H) 70 - 99 mg/dL   BUN 12 8 - 23 mg/dL   Creatinine, Ser 9.04 0.44 - 1.00 mg/dL   Calcium 9.5 8.9 - 89.6 mg/dL   GFR, Estimated >39 >39 mL/min   Anion gap 9 5 - 15  CBC   Collection Time: 01/30/24  8:28 PM  Result Value Ref Range   WBC 7.7 4.0 - 10.5 K/uL   RBC 3.67 (L) 3.87 - 5.11 MIL/uL   Hemoglobin 11.9 (L) 12.0 - 15.0 g/dL   HCT 63.9 63.9 - 53.9 %   MCV 98.1 80.0 - 100.0 fL   MCH 32.4 26.0 - 34.0 pg   MCHC 33.1 30.0 - 36.0 g/dL   RDW 86.5 88.4 - 84.4 %   Platelets 227 150 - 400 K/uL   nRBC 0.0 0.0 - 0.2 %     Assessment & Plan:   Assessment & Plan Essential hypertension - Decreased chlorthalidone  dose to 25 mg daily at her  last visit. Refilled today. BP well controlled with chlorthalidone  25 and valsartan  320 mg. - Avoid excess sodium in diet. Orders:   chlorthalidone  (HYGROTON ) 25 MG tablet; Take 1 tablet (25  mg total) by mouth daily.   Acute pain of right knee Right knee pain with mild osteoarthritis  right knee pain for approximately one month, primarily on the medial side, with tenderness and pain exacerbated by movement. X-ray shows mild degenerative changes and bone spurs, but no acute abnormalities. Pain persists despite previous conservative management. - Administer Kenalog  40 mg with lidocaine  injection into the knee joint. AFTER OBTAINING VERBAL INFORMED CONSENT, MEDIAL ASPECT OF RIGHT KNEE IS PALPATED, AREA MARKED FOR INJECTION. CLEANED THOROUGHLY AND 5 ML MIXTURE OF 1 ML KENALOG  AND 4 ML LIDOCAINE  injected into the right knee. Patient tolerated the procedure well. Post procedure instructions given.  - Refer to physical therapy for range of motion and pain control. - Advise to monitor symptoms over the next 2-3 weeks and follow up if no improvement. - Consider MRI or specialist referral if no improvement after physical therapy. Orders:   Ambulatory referral to Physical Therapy     Body mass index is 36.38 kg/m..        Meds ordered this encounter  Medications   chlorthalidone  (HYGROTON ) 25 MG tablet    Sig: Take 1 tablet (25 mg total) by mouth daily.    Dispense:  90 tablet    Refill:  1    Please send a replace/new response with 100-Day Supply if appropriate to maximize member benefit. Requesting 1 year supply. Dose decreased to 25 mg daily    Orders Placed This Encounter  Procedures   Ambulatory referral to Physical Therapy     Follow-up: Return if symptoms worsen or fail to improve.  An After Visit Summary was printed and given to the patient.  Orvile Corona, MD [facility] [phone]

## 2024-02-02 NOTE — Assessment & Plan Note (Signed)
 Right knee pain with mild osteoarthritis  right knee pain for approximately one month, primarily on the medial side, with tenderness and pain exacerbated by movement. X-ray shows mild degenerative changes and bone spurs, but no acute abnormalities. Pain persists despite previous conservative management. - Administer Kenalog  40 mg with lidocaine  injection into the knee joint. AFTER OBTAINING VERBAL INFORMED CONSENT, MEDIAL ASPECT OF RIGHT KNEE IS PALPATED, AREA MARKED FOR INJECTION. CLEANED THOROUGHLY AND 5 ML MIXTURE OF 1 ML KENALOG  AND 4 ML LIDOCAINE  injected into the right knee. Patient tolerated the procedure well. Post procedure instructions given.  - Refer to physical therapy for range of motion and pain control. - Advise to monitor symptoms over the next 2-3 weeks and follow up if no improvement. - Consider MRI or specialist referral if no improvement after physical therapy. Orders:   Ambulatory referral to Physical Therapy

## 2024-02-02 NOTE — Patient Instructions (Signed)
  VISIT SUMMARY: Today, we addressed your persistent right knee pain and discussed your recent cardiac catheterization and its post-procedural symptoms. We also reviewed your hypertension management.  YOUR PLAN: RIGHT KNEE PAIN WITH MILD OSTEOARTHRITIS: You have been experiencing chronic right knee pain for about a month, primarily on the medial side. An X-ray showed mild degenerative changes and bone spurs. -We administered a Kenalog  40 mg with lidocaine  injection into your knee joint to help with the pain. -You are referred to physical therapy to improve your range of motion and manage pain. -Monitor your symptoms over the next 2-3 weeks and follow up if there is no improvement. -If there is no improvement after physical therapy, we may consider an MRI or refer you to a specialist.  HYPERTENSION: Your blood pressure is well-controlled with your current medication regimen. -We have sent a prescription for chlorthalidone  25 mg to [facility] for a 90-day supply.                      Contains text generated by Abridge.                                 Contains text generated by Abridge.

## 2024-02-02 NOTE — Assessment & Plan Note (Signed)
-   Decreased chlorthalidone  dose to 25 mg daily at her last visit. Refilled today. BP well controlled with chlorthalidone  25 and valsartan  320 mg. - Avoid excess sodium in diet. Orders:   chlorthalidone  (HYGROTON ) 25 MG tablet; Take 1 tablet (25 mg total) by mouth daily.

## 2024-02-04 NOTE — Telephone Encounter (Signed)
 Left message for the patient to call back.

## 2024-02-05 ENCOUNTER — Telehealth: Payer: Self-pay

## 2024-02-05 NOTE — Telephone Encounter (Signed)
 Called the patient and verified her follow up appointment with Dr. Liborio. Patient verbalized understanding and had no further questions at this time.

## 2024-02-05 NOTE — Telephone Encounter (Signed)
 Patient stated that she was told by the cardiac doctor that performed her cardiac cath that she could stop the Aspirin  medication. When she went in to the ER on last Friday she asked the PA that she saw there and he explained to her when to re-start her Aspirin  medication. Patient has no further questions at this time.

## 2024-02-05 NOTE — Telephone Encounter (Signed)
 Pt returning Megan Galvan call. Please advise.

## 2024-02-06 ENCOUNTER — Ambulatory Visit: Attending: Cardiology | Admitting: Cardiology

## 2024-02-06 ENCOUNTER — Telehealth: Payer: Self-pay

## 2024-02-06 ENCOUNTER — Encounter: Payer: Self-pay | Admitting: Cardiology

## 2024-02-06 VITALS — BP 130/68 | HR 82 | Ht 65.0 in | Wt 216.8 lb

## 2024-02-06 DIAGNOSIS — I10 Essential (primary) hypertension: Secondary | ICD-10-CM | POA: Diagnosis not present

## 2024-02-06 DIAGNOSIS — I480 Paroxysmal atrial fibrillation: Secondary | ICD-10-CM | POA: Diagnosis not present

## 2024-02-06 DIAGNOSIS — I97630 Postprocedural hematoma of a circulatory system organ or structure following a cardiac catheterization: Secondary | ICD-10-CM | POA: Diagnosis not present

## 2024-02-06 DIAGNOSIS — E782 Mixed hyperlipidemia: Secondary | ICD-10-CM | POA: Diagnosis not present

## 2024-02-06 DIAGNOSIS — R079 Chest pain, unspecified: Secondary | ICD-10-CM

## 2024-02-06 DIAGNOSIS — Z953 Presence of xenogenic heart valve: Secondary | ICD-10-CM

## 2024-02-06 NOTE — Telephone Encounter (Signed)
 Pt had Cath on 01/28/2024. Post op at incision site  started out with a pea size knot but now it is about an inch long. Tender and swollen. Please advise

## 2024-02-06 NOTE — Patient Instructions (Signed)
 Medication Instructions:  Your physician recommends that you continue on your current medications as directed. Please refer to the Current Medication list given to you today.  Hold aspirin  until Monday.  *If you need a refill on your cardiac medications before your next appointment, please call your pharmacy*   Lab Work: None ordered If you have labs (blood work) drawn today and your tests are completely normal, you will receive your results only by: MyChart Message (if you have MyChart) OR A paper copy in the mail If you have any lab test that is abnormal or we need to change your treatment, we will call you to review the results.   Testing/Procedures: None ordered   Follow-Up: At [facility], you and your health needs are our priority.  As part of our continuing mission to provide you with exceptional heart care, we have created designated Provider Care Teams.  These Care Teams include your primary Cardiologist (physician) and Advanced Practice Providers (APPs -  Physician Assistants and Nurse Practitioners) who all work together to provide you with the care you need, when you need it.  We recommend signing up for the patient portal called MyChart.  Sign up information is provided on this After Visit Summary.  MyChart is used to connect with patients for Virtual Visits (Telemedicine).  Patients are able to view lab/test results, encounter notes, upcoming appointments, etc.  Non-urgent messages can be sent to your provider as well.   To learn more about what you can do with MyChart, go to [email or web address].    Your next appointment:   Keep FU with Dr. Liborio   The format for your next appointment:   In Person  Provider:   Jennifer Crape, MD    Other Instructions none  Important Information About Sugar

## 2024-02-06 NOTE — Progress Notes (Signed)
 Cardiology Office Note   Date:  02/06/2024  ID:  Megan Galvan, DOB 1950/11/26, MRN [id number] PCP: Sherre Clapper, MD  [facility] Providers Cardiologist:  Redell Leiter, MD     History of Present Illness Megan Galvan is a 73 y.o. female with a past history of severe aortic stenosis s/p AVR, HFpEF, CAD, hypertension, paroxysmal atrial fibrillation, venous insufficiency, GERD, DM2, dyslipidemia, OSA on CPAP, mild bilateral carotid artery stenosis  01/28/2024 left heart cath normal coronary arteries 12/04/2023 echo EF 60 to 65%, grade 1 DD, valve seated appropriately with peak velocity 2.8 mL/s and mean gradient 15 mmHg 11/28/2021 echo EF 66 5%, mild concentric LVH, grade 1 DD, mild MR, bioprosthetic valve normal structure and functioning appropriately 08/10/2020 AVR 21 mm Edwards Resilia Inspira bioprosthetic valve 07/31/2020 carotid duplex mild bilateral carotid artery stenosis 07/31/2020 coronary CT normal coronary arteries, small PFO present, bicuspid aortic valve 07/27/2020 cardiac cath severely calcified aortic stenosis, minimal luminal irregularities elsewhere  She established care with Dr. Leiter in March 2022 at the behest of her PCP for evaluation of her murmur, she had already undergone an echocardiogram revealing severe aortic stenosis.  She was sent to the structural heart team, underwent cardiac catheterization revealing severe aortic stenosis, ultimately underwent AVR with open sternotomy on 08/10/2020.  Most recently evaluated by Dr. Liborio on 01/21/2024 with concerns of worsening pedal edema, shortness of breath as well as chest pain.  The decision was made to proceed with left heart cath and this was completed on 01/28/2024 revealing normal coronary arteries.  She was evaluated in emergency department following her cath for concern of swelling around her right radial cath site, advised it was P shape and size, she had noticed that it continued to swell and so she presents today  for follow-up.  It does appear that the swelling has increased somewhat, possibly the size of a kidney bean, does not have pulsation over the swelling, she has strong pulses distal from cath site.  She denies further episodes of chest pain but does endorse a high level of personal stress, her husband speaks up and says he thinks she is dealing with depression as well.     ROS: Review of Systems  Musculoskeletal:  Positive for back pain and joint pain.  Endo/Heme/Allergies:  Bruises/bleeds easily.  Psychiatric/Behavioral:  Positive for depression.   All other systems reviewed and are negative.    Studies Reviewed      Cardiac Studies & Procedures   ______________________________________________________________________________________________ CARDIAC CATHETERIZATION  CARDIAC CATHETERIZATION 01/28/2024  Conclusion Images from the original result were not included. Cardiac Catheterization 01/28/24: Hemodynamic data: LV: 154/-8, EDP 14 mmHg.  Ao 126/49, mean 81 mmHg.  There is no pressure gradient across the aortic valve.  Angiographic data: Angiographically normal coronary arteries, large left main, very large LAD with a large D1 which is LAD: And circumflex coronary artery with small OM1 and 2 and large OM 3 and 4.  Right coronary artery is large with a large PL branch and a small PDA.    Impression and recommendations: Evaluate for noncardiac cause of chest pain.  Findings Coronary Findings Diagnostic  Dominance: Right  Left Main Vessel is angiographically normal.  Left Anterior Descending Vessel is large. Vessel is angiographically normal.  Left Circumflex Vessel is angiographically normal.  Right Coronary Artery Vessel is angiographically normal.  Intervention  No interventions have been documented.   CARDIAC CATHETERIZATION  CARDIAC CATHETERIZATION 07/27/2020  Conclusion 1. Severe calcific aortic stenosis with mean gradient  67 mmHg 2. Patent coronary  arteries (right dominant) with minimal irregularities but no significant stenoses 3. Essentially normal right heart pressures with the exception of mild pulmonary HTN (mean PAP 27 mmHg)  Recommend: Continued multidisciplinary evaluation for treatment of severe symptomatic aortic stenosis.  Findings Coronary Findings Diagnostic  Dominance: Right  Left Main Vessel is angiographically normal.  Left Anterior Descending The vessel exhibits minimal luminal irregularities.  Left Circumflex The vessel exhibits minimal luminal irregularities.  Right Coronary Artery The vessel exhibits minimal luminal irregularities.  Intervention  No interventions have been documented.     ECHOCARDIOGRAM  ECHOCARDIOGRAM COMPLETE 12/04/2023  Narrative ECHOCARDIOGRAM REPORT    Patient Name:   Megan Galvan Date of Exam: 12/04/2023 Medical Rec #:  [id number]       Height:       67.0 in Accession #:    [id number]      Weight:       241.0 lb Date of Birth:  1951/02/05       BSA:          2.189 m Patient Age:    73 years        BP:           132/78 mmHg Patient Gender: F               HR:           65 bpm. Exam Location:  [place]  Procedure: 2D Echo, Cardiac Doppler, Color Doppler and Strain Analysis (Both Spectral and Color Flow Doppler were utilized during procedure).  Indications:    S/P aortic valve replacement with bioprosthetic valve [Z95.3 (ICD-10-CM)], Chronic diastolic CHF (congestive heart failure), NYHA class 2 (HCC) [I50.32 (ICD-10-CM)], Essential hypertension [I10 (ICD-10-CM)], Mixed hyperlipidemia [E78.2 (ICD-10-CM)]  History:        Patient has prior history of Echocardiogram examinations, most recent 11/28/2021. S/P AVR and CHF, Arrythmias:Atrial Fibrillation; Risk Factors:Hypertension and Diabetes. Aortic Valve: 21 mm Edwards bioprosthetic valve is present in the aortic position.  Sonographer:    Saddie Chimes Referring Phys: [id number] SREEDHAR R  MADIREDDY  IMPRESSIONS   1. Left ventricular ejection fraction, by estimation, is 60 to 65%. The left ventricle has normal function. The left ventricle has no regional wall motion abnormalities. Left ventricular diastolic parameters are consistent with Grade I diastolic dysfunction (impaired relaxation). The average left ventricular global longitudinal strain is 21.4 %. The global longitudinal strain is normal. 2. Right ventricular systolic function is normal. The right ventricular size is normal. There is normal pulmonary artery systolic pressure. 3. The mitral valve is normal in structure. No evidence of mitral valve regurgitation. No evidence of mitral stenosis. 4. The aortic valve was not well visualized. Aortic valve regurgitation is not visualized. No aortic stenosis is present. There is a 21 mm Edwards Resilia Inspira bioprosthetic valve present in the aortic position. Satisfactory function. 5. The inferior vena cava is normal in size with greater than 50% respiratory variability, suggesting right atrial pressure of 3 mmHg.  FINDINGS Left Ventricle: Left ventricular ejection fraction, by estimation, is 60 to 65%. The left ventricle has normal function. The left ventricle has no regional wall motion abnormalities. The average left ventricular global longitudinal strain is 21.4 %. Strain was performed and the global longitudinal strain is normal. The left ventricular internal cavity size was normal in size. There is no left ventricular hypertrophy. Left ventricular diastolic parameters are consistent with Grade I diastolic dysfunction (impaired relaxation).  Right Ventricle: The right ventricular size  is normal. No increase in right ventricular wall thickness. Right ventricular systolic function is normal. There is normal pulmonary artery systolic pressure. The tricuspid regurgitant velocity is 2.61 m/s, and with an assumed right atrial pressure of 3 mmHg, the estimated right ventricular  systolic pressure is 30.2 mmHg.  Left Atrium: Left atrial size was normal in size.  Right Atrium: Right atrial size was normal in size.  Pericardium: There is no evidence of pericardial effusion.  Mitral Valve: The mitral valve is normal in structure. No evidence of mitral valve regurgitation. No evidence of mitral valve stenosis. MV peak gradient, 5.7 mmHg. The mean mitral valve gradient is 2.0 mmHg.  Tricuspid Valve: The tricuspid valve is normal in structure. Tricuspid valve regurgitation is not demonstrated. No evidence of tricuspid stenosis.  Aortic Valve: The aortic valve was not well visualized. Aortic valve regurgitation is not visualized. No aortic stenosis is present. Aortic valve mean gradient measures 15.0 mmHg. Aortic valve peak gradient measures 31.4 mmHg. Aortic valve area, by VTI measures 1.26 cm. There is a 21 mm Edwards bioprosthetic valve present in the aortic position.  Pulmonic Valve: The pulmonic valve was normal in structure. Pulmonic valve regurgitation is not visualized. No evidence of pulmonic stenosis.  Aorta: The aortic root is normal in size and structure.  Venous: The inferior vena cava is normal in size with greater than 50% respiratory variability, suggesting right atrial pressure of 3 mmHg.  IAS/Shunts: No atrial level shunt detected by color flow Doppler.   LEFT VENTRICLE PLAX 2D LVIDd:         3.80 cm   Diastology LVIDs:         2.00 cm   LV e' medial:    6.74 cm/s LV PW:         1.70 cm   LV E/e' medial:  16.0 LV IVS:        1.50 cm   LV e' lateral:   5.87 cm/s LVOT diam:     2.10 cm   LV E/e' lateral: 18.4 LV SV:         83 LV SV Index:   38        2D Longitudinal Strain LVOT Area:     3.46 cm  2D Strain GLS Avg:     21.4 %   RIGHT VENTRICLE RV Basal diam:  3.60 cm RV Mid diam:    2.50 cm TAPSE (M-mode): 2.5 cm  LEFT ATRIUM           Index LA diam:      3.60 cm 1.64 cm/m LA Vol (A4C): 58.2 ml 26.59 ml/m AORTIC VALVE                      PULMONIC VALVE AV Area (Vmax):    1.03 cm      PV Vmax:       1.17 m/s AV Area (Vmean):   1.17 cm      PV Vmean:      77.400 cm/s AV Area (VTI):     1.26 cm      PV VTI:        0.349 m AV Vmax:           280.20 cm/s   PV Peak grad:  5.5 mmHg AV Vmean:          177.000 cm/s  PV Mean grad:  3.0 mmHg AV VTI:            0.654  m AV Peak Grad:      31.4 mmHg AV Mean Grad:      15.0 mmHg LVOT Vmax:         83.00 cm/s LVOT Vmean:        59.650 cm/s LVOT VTI:          0.238 m LVOT/AV VTI ratio: 0.36  AORTA Ao Asc diam: 3.10 cm  MITRAL VALVE                TRICUSPID VALVE MV Area (PHT): 2.45 cm     TR Peak grad:   27.2 mmHg MV Area VTI:   1.42 cm     TR Vmax:        261.00 cm/s MV Peak grad:  5.7 mmHg MV Mean grad:  2.0 mmHg     SHUNTS MV Vmax:       1.19 m/s     Systemic VTI:  0.24 m MV Vmean:      65.4 cm/s    Systemic Diam: 2.10 cm MV Decel Time: 310 msec MV E velocity: 108.00 cm/s MV A velocity: 121.00 cm/s MV E/A ratio:  0.89  Joneric Streight Crape MD Electronically signed by Yvanna Vidas Crape MD Signature Date/Time: 12/04/2023/2:05:53 PM    Final   TEE  ECHO INTRAOPERATIVE TEE 08/10/2020  Narrative *INTRAOPERATIVE TRANSESOPHAGEAL REPORT *    Patient Name:   SHARLET DELENA STACKS Date of Exam: 08/10/2020 Medical Rec #:  [id number]       Height:       66.0 in Accession #:    [id number]      Weight:       206.0 lb Date of Birth:  09-Jan-1951       BSA:          2.03 m Patient Age:    70 years        BP:           150/50 mmHg Patient Gender: F               HR:           49 bpm. Exam Location:  Inpatient  Transesophogeal exam was perform intraoperatively during surgical procedure. Patient was closely monitored under general anesthesia during the entirety of examination.  Indications:     aortic stenosis Sonographer:     Tinnie Barefoot RDCS Performing Phys: [id number] H OWEN Diagnosing Phys: Alm Laity MD  PROCEDURE: Intraoperative Transesophogeal No difficulty noted  with probe insertion. Complications: No known complications during this procedure. PRE-OP FINDINGS Left Ventricle: The left ventricle has normal systolic function, with an ejection fraction of 60-65%. There was moderate concentric LV hypertrophy. The LV wall thickness was 1.25-1.30 cm at end-diastole at the mid-papillary level. The ejection fraction was 60-65% using the 2D Simpson's method in the 4 and 2 chamber views. There were no regional wall motion abnormalities.  On the post-bypass exam, the LV systolic function was unchanged fro the pre-bypass exam.   Right Ventricle: The right ventricle has normal systolic function. The cavity was normal. There is no increase in right ventricular wall thickness. The TAPSE was 2.15 cm.  Left Atrium: Left atrial size was dilated. No left atrial/left atrial appendage thrombus was detected.  Right Atrium: Right atrial size was normal in size.  Interatrial Septum: There was a small patent foramen ovale seen with color Doppler. This was located at the superior aspect of the fossa ovalis. There was left to right shunting.  Pericardium: There is no  evidence of pericardial effusion.  Mitral Valve: The mitral valve is normal in structure. Mitral valve regurgitation is mild by color flow Doppler. The MR jet is centrally-directed. There is No evidence of mitral stenosis.  Tricuspid Valve: The tricuspid valve was normal in structure. Tricuspid valve regurgitation is mild by color flow Doppler. No evidence of tricuspid stenosis is present.  Aortic Valve: There was severe aortic stenosis and mild/moderate aortic insufficiency. 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 The peak velocity was 4.36 m/sec. with a peak gradient of 76 mm hg and a mean gradient of 39 mm hg. The AVA by Vmax was 0.59 cm2 and 0.56 by the VTI method. The ratio of the LVOT VTI/AV VTI was 0.147. There was an eccentric jet of aortic  insufficiency. The AI PHT was 556 ms. The aortic annulus diameter measured 2.25 cm.  On the post-bypass exam, there was a bioprosthetic valve in the aortic position. The leaflets opened normally and there was no aortic insufficiency. The mean trans-aortic gradient was 6 mm hg. The ratio of the LVOT VTI/AV VTI was 0.55.    Pulmonic Valve: The pulmonic valve was normal in structure, with normal. No evidence of pumonic stenosis. Pulmonic valve regurgitation is not visualized by color flow Doppler.   Aorta: The aortic root and proximal ascending aorta were normal in diameter with a well defined sino-tubular junction without effacement. There was intimal thickening and no protruding atheromatous disease. Aortic root: 3.06 cm STJ: 2.31 cm Proximal ascending aorta: 2.83 cm  The descending aorta measured 2.15 cm in diameter. There was mild intimal thickening.  +--------------+--------++ LEFT VENTRICLE         +--------------+--------++ PLAX 2D                +--------------+--------++ LVIDd:        4.70 cm  +--------------+--------++ LVIDs:        3.00 cm  +--------------+--------++ LVOT diam:    2.20 cm  +--------------+--------++ LV SV:        67 ml    +--------------+--------++ LV SV Index:  31.77    +--------------+--------++ LVOT Area:    3.80 cm +--------------+--------++                        +--------------+--------++  +------------------+---------++ LV Volumes (MOD)            +------------------+---------++ LV area d, A2C:   25.80 cm +------------------+---------++ LV area d, A4C:   31.20 cm +------------------+---------++ LV area s, A2C:   16.70 cm +------------------+---------++ LV area s, A4C:   17.70 cm +------------------+---------++ LV major d, A2C:  7.68 cm   +------------------+---------++ LV major d, A4C:  7.88 cm   +------------------+---------++ LV major s, A2C:  7.13 cm    +------------------+---------++ LV major s, A4C:  7.24 cm   +------------------+---------++ LV vol d, MOD A2C:73.0 ml   +------------------+---------++ LV vol d, MOD A4C:101.0 ml  +------------------+---------++ LV vol s, MOD A2C:32.9 ml   +------------------+---------++ LV vol s, MOD A4C:35.7 ml   +------------------+---------++ LV SV MOD A2C:    40.1 ml   +------------------+---------++ LV SV MOD A4C:    101.0 ml  +------------------+---------++ LV SV MOD BP:     51.6 ml   +------------------+---------++  +------------------+------------++ AORTIC VALVE                   +------------------+------------++ AV Area (Vmax):   0.59 cm     +------------------+------------++  AV Area (Vmean):               +------------------+------------++ AV Area (VTI):    0.56 cm     +------------------+------------++ AV Vmax:          436 cm/s     +------------------+------------++ AV Vmean:         225.333 cm/s +------------------+------------++ AV VTI:           1.11 m       +------------------+------------++ AV Peak Grad:     76 mmHg      +------------------+------------++ AV Mean Grad:     39 mmHg      +------------------+------------++ LVOT Vmax:        91.50 cm/s   +------------------+------------++ LVOT Vmean:       56.100 cm/s  +------------------+------------++ LVOT VTI:         0.164 m      +------------------+------------++ LVOT/AV VTI ratio:0.147        +------------------+------------++ AR PHT:           556 msec     +------------------+------------++   +--------------+-------+ SHUNTS                +--------------+-------+ Systemic VTI: 0.18 m  +--------------+-------+ Systemic Diam:2.20 cm +--------------+-------+   Alm Laity MD Electronically signed by Alm Laity MD Signature Date/Time: 08/10/2020/7:31:07 PM    Final    CT SCANS  CT CORONARY  MORPH W/CTA COR W/SCORE 07/31/2020  Addendum 07/31/2020  4:24 PM ADDENDUM REPORT: 07/31/2020 16:21  CLINICAL DATA:  Severe Aortic Stenosis.  EXAM: Cardiac TAVR CT  TECHNIQUE: The patient was scanned on a [employer]. A 110 kV retrospective scan was triggered in the descending thoracic aorta at 111 HU's. Gantry rotation speed was 250 msecs and collimation was .6 mm. No beta blockade or nitro were given. The 3D data set was reconstructed in 5% intervals of the R-R cycle. Systolic and diastolic phases were analyzed on a dedicated work station using MPR, MIP and VRT modes. The patient received 80 cc of contrast.  FINDINGS: Image quality: Excellent.  Noise artifact is: Limited.  Valve Morphology: The aortic valve is bicuspid with fusion of the RCC/LCC with raphe Jeane type 1). The leaflets demonstrate restricted movement in systole. The leaflets are severely calcified with bulky calcification of the raphe and NCC.  Aortic Valve Calcium score: 2167  Aortic annular dimension:  Phase assessed: 10%  Annular area: 512 mm2  Annular perimeter: 82.5 mm  Max diameter: 30.1 mm  Min diameter: 22.0 mm  Annular and subannular calcification: None.  Optimal coplanar projection: LAO 6 CAU 5  Coronary Artery Height above Annulus:  Left Main: 12.2 mm  Right Coronary: 18.8 mm  Sinus of Valsalva Measurements:  Non-coronary: 33 mm  Right-coronary: 31 mm  Left-coronary: 31 mm  Sinus of Valsalva Height:  Non-coronary: 22.9 mm  Right-coronary: 18.8 mm  Left-coronary: 17.6 mm  Sinotubular Junction: 26 mm  Ascending Thoracic Aorta: 28 mm  Coronary Arteries: Normal coronary origin. Right dominance. The study was performed without use of NTG and is insufficient for plaque evaluation. Please refer to recent cardiac catheterization for coronary assessment. No significant CAD.  Cardiac Morphology:  Right Atrium: Right atrial size is within normal limits.  Right  Ventricle: The right ventricular cavity is within normal limits.  Left Atrium: Left atrial size is normal in size with no left atrial appendage filling defect. A small PFO is present.  Left Ventricle: The ventricular cavity size is within  normal limits. There are no stigmata of prior infarction. There is no abnormal filling defect. Normal LV function, LVEF=69%.  Pulmonary arteries: Normal in size without proximal filling defect.  Pulmonary veins: Normal pulmonary venous drainage.  Pericardium: Normal thickness with no significant effusion or calcium present.  Mitral Valve: The mitral valve is normal structure without significant calcification.  Extra-cardiac findings: See attached radiology report for non-cardiac structures.  IMPRESSION: 1. 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 Severe aortic stenosis (calcium score 2167). Severe bulky calcifications of the NCC and raphe.  2. Annular measurements appropriate for 26 mm S3 TAVR (512 mm2).  3. No significant annular or subannular calcifications.  4. Sufficient coronary to annulus distance.  5. Optimal Fluoroscopic Angle for Delivery: LAO 6 CAU 5  6. Small PFO.  Darryle T. Barbaraann, MD   Electronically Signed By: Darryle Barbaraann On: 07/31/2020 16:21  Narrative EXAM: OVER-READ INTERPRETATION  CT CHEST  The following report is an over-read performed by radiologist Dr. Toribio Aye of [facility] on 07/31/2020. This over-read does not include interpretation of cardiac or coronary anatomy or pathology. The coronary calcium score/coronary CTA interpretation by the cardiologist is attached.  COMPARISON:  No priors.  FINDINGS: Extracardiac findings will be described separately under dictation for contemporaneously obtained CTA chest, abdomen and pelvis.  IMPRESSION: Please see separate dictation for contemporaneously obtained CTA chest, abdomen and pelvis dated 07/31/2020 for full  description of relevant extracardiac findings.  Electronically Signed: By: Toribio Aye M.D. On: 07/31/2020 13:32     ______________________________________________________________________________________________      Risk Assessment/Calculations  CHA2DS2-VASc Score = 6   This indicates a 9.7% annual risk of stroke. The patient's score is based upon: CHF History: 1 HTN History: 1 Diabetes History: 1 Stroke History: 0 Vascular Disease History: 1 Age Score: 1 Gender Score: 1            Physical Exam VS:  BP 130/68   Pulse 82   Ht 5' 5 (1.651 m)   Wt 216 lb 12.8 oz (98.3 kg)   BMI 36.08 kg/m        Wt Readings from Last 3 Encounters:  02/06/24 216 lb 12.8 oz (98.3 kg)  02/02/24 218 lb 9.6 oz (99.2 kg)  01/30/24 218 lb 0.6 oz (98.9 kg)    GEN: Well nourished, well developed in no acute distress NECK: No JVD; No carotid bruits CARDIAC: RRR, 3/6 systolic murmur radiating to her carotid arteries, rubs, gallops RESPIRATORY:  Clear to auscultation without rales, wheezing or rhonchi  ABDOMEN: Soft, non-tender, non-distended EXTREMITIES:  No edema; No deformity  SKIN: right radial cath site with hematoma slightly increased in size, +2 radial pulse distal from cath site  ASSESSMENT AND PLAN Hematoma-she was evaluated in the emergency department where it was described as pea size and shape, it is slightly larger than that now approximately the size of a kidney bean.  Will hold her aspirin  for the next 2 days and she can resume on Monday.  She does have good circulation distal from insertion site, hand is warm and dry.  AVR-AVR in 2022  CAD-most recent cath revealed normal coronary arteries, cath in 2022 leading up to her AVR revealed mild luminal irregularities.  No further episodes of chest pain.  PAF/hypercoagulable state-this was apparently in the postoperative setting following her AVR, no recurrence of atrial fibrillation that she is aware of she is not  anticoagulated should she have recurrence then DOAC would need to be  considered at that time.  Hypertension-blood pressure was initially elevated in the office however upon recheck it was well-controlled at 130/68, continue Diovan  320 mg daily.          Dispo: Keep follow up with Dr. Liborio.   Signed, Delon JAYSON Hoover, NP

## 2024-02-06 NOTE — Telephone Encounter (Signed)
 Appointment made with Delon Hoover 02/06/24.

## 2024-02-18 ENCOUNTER — Ambulatory Visit

## 2024-02-18 VITALS — BP 156/78 | HR 82 | Ht 66.0 in | Wt 218.4 lb

## 2024-02-18 DIAGNOSIS — I10 Essential (primary) hypertension: Secondary | ICD-10-CM

## 2024-02-18 DIAGNOSIS — I739 Peripheral vascular disease, unspecified: Secondary | ICD-10-CM

## 2024-02-18 DIAGNOSIS — E782 Mixed hyperlipidemia: Secondary | ICD-10-CM | POA: Diagnosis not present

## 2024-02-18 DIAGNOSIS — I480 Paroxysmal atrial fibrillation: Secondary | ICD-10-CM

## 2024-02-18 DIAGNOSIS — I5032 Chronic diastolic (congestive) heart failure: Secondary | ICD-10-CM

## 2024-02-18 NOTE — Assessment & Plan Note (Signed)
 Transient postop atrial fibrillation around the time of her aortic valve surgery April 2022. No further recurrences. No indication for long-term anticoagulation at this time.

## 2024-02-18 NOTE — Assessment & Plan Note (Signed)
 Last lipid panel available from 11/21/2023 total cholesterol 124, HDL 39, LDL 63, triglycerides 125. Well-controlled. Continue pravastatin  40 mg once daily.

## 2024-02-18 NOTE — Assessment & Plan Note (Signed)
 Appears compensated. Trace bilateral ankle edema. Grade 1 diastolic dysfunction on echocardiogram LVEF 60 to 65% August 2025. LVEDP 14 mmHg on recent cardiac cath 01/28/2024.  Weight today 218 pounds. Continue salt restriction below 2 g/day. Continue furosemide  40 mg every other day.  Not on beta-blockers due to relatively slow heart rates at baseline. Continue valsartan  320 mg once daily. Not a candidate for SGLT2 inhibitors due to UTIA and yeast infection

## 2024-02-18 NOTE — Progress Notes (Signed)
 Cardiology Consultation:    Date:  02/18/2024   ID:  Megan Galvan, DOB 07-19-1950, MRN [id number]  PCP:  Sherre Clapper, MD  Cardiologist:  Alean SAUNDERS Marijo Quizon, MD   Referring MD: Sherre Clapper, MD   No chief complaint on file.    ASSESSMENT AND PLAN:   Ms. Megan Galvan 73 year old woman No significant coronary artery disease on cardiac cath 01/28/2024, severe aortic stenosis s/p surgical aortic valve replacement [21 mm Edwards Resilia Inspira bioprosthetic 08/10/2020], transient postop atrial fibrillation managed with amiodarone ,  chronic diastolic CHF LVEDP 14 mmHg cardiac cath 01/28/2024, small PFO noted on cardiac CT April 2022, diabetes, hyperlipidemia, obstructive sleep apnea-uses CPAP, GERD, vertigo, s/p C-spine surgery October 2024.   echocardiogram completed 12/04/2023 LVEF 60 to 65%, grade 1 diastolic dysfunction, normal RV function, well-seated aortic valve prosthesis with peak velocity 2.8 m/s and mean gradient 15 mmHg.  Gradients across the bioprosthetic aortic valve on cardiac cath 01/28/2024 were normal.   Problem List Items Addressed This Visit     Claudication in peripheral vascular disease   Obtain bilateral lower extremity arterial duplex studies      Mixed hyperlipidemia - Primary   Last lipid panel available from 11/21/2023 total cholesterol 124, HDL 39, LDL 63, triglycerides 125. Well-controlled. Continue pravastatin  40 mg once daily.       Relevant Orders   VAS US  LOWER EXTREMITY ARTERIAL DUPLEX   VAS US  ABI WITH/WO TBI   Essential hypertension   Continue chlorthalidone  25 mg once daily Continue valsartan  320 mg once daily. Remains suboptimal today. Advised to continue monitoring closely at home and if remains consistently elevated may consider adding low-dose amlodipine.      Chronic diastolic CHF (congestive heart failure), NYHA class 2 (HCC)   Appears compensated. Trace bilateral ankle edema. Grade 1 diastolic dysfunction on echocardiogram LVEF 60 to  65% August 2025. LVEDP 14 mmHg on recent cardiac cath 01/28/2024.  Weight today 218 pounds. Continue salt restriction below 2 g/day. Continue furosemide  40 mg every other day.  Not on beta-blockers due to relatively slow heart rates at baseline. Continue valsartan  320 mg once daily. Not a candidate for SGLT2 inhibitors due to UTIA and yeast infection      Paroxysmal atrial fibrillation (HCC)   Transient postop atrial fibrillation around the time of her aortic valve surgery April 2022. No further recurrences. No indication for long-term anticoagulation at this time.      Return to clinic tentatively in 6 months.   History of Present Illness:    Megan Galvan is a 73 y.o. female who is being seen today for follow-up visit. PCP is Cox, Kirsten, MD. Last visit with me in the office was 01/21/2024. Subsequently had cardiac cath on 01/28/2024 with Dr. Ladona and office visit follow-up with Delon Hoover, NP-C 02/06/2024.  No significant coronary artery disease on cardiac cath 01/28/2024, severe aortic stenosis s/p surgical aortic valve replacement [21 mm Edwards Resilia Inspira bioprosthetic 08/10/2020], transient postop atrial fibrillation managed with amiodarone ,  chronic diastolic CHF LVEDP 14 mmHg cardiac cath 01/28/2024, small PFO noted on cardiac CT April 2022, diabetes, hyperlipidemia, obstructive sleep apnea-uses CPAP, GERD, vertigo, s/p C-spine surgery October 2024.   Last echocardiogram completed 12/04/2023 LVEF 60 to 65%, grade 1 diastolic dysfunction, normal RV function, well-seated aortic valve prosthesis with peak velocity 2.8 m/s and mean gradient 15 mmHg.  Gradients across the bioprosthetic aortic valve on cardiac cath 01/28/2024 were normal.  Here for the visit today accompanied by her husband. Reports no  symptoms of chest pain or shortness of breath. Her weight has remained steady around 217 to 218 pounds Continues to take Lasix  40 mg every other day as prescribed at last  office visit  Right radial Access site swelling which was prominent after her cath and was evaluated at her earlier office visit with Delon Hoover 02/06/2024 appears to have improved significantly.  She describes bilateral lower extremity pain extending from knees to her feet which appears more musculoskeletal in nature but cannot entirely exclude claudication-like symptoms. She also has an upcoming visit with orthopedic doctor for evaluation of bone spurs of the right knee.  Good compliance with her medications.   Past Medical History:  Diagnosis Date   Acute cystitis without hematuria 06/21/2022   Acute pain of right knee 02/02/2024   Allergic sinusitis 08/27/2022   Atrophic vaginitis 03/31/2021   Candidiasis, intertrigo 12/21/2023   Cataract    Chronic diastolic CHF (congestive heart failure), NYHA class 2 (HCC) 11/17/2023   Class 1 obesity due to excess calories with serious comorbidity and body mass index (BMI) of 33.0 to 33.9 in adult 03/28/2022   Class 1 obesity due to excess calories with serious comorbidity and body mass index (BMI) of 34.0 to 34.9 in adult 03/28/2022   Class 2 severe obesity with serious comorbidity and body mass index (BMI) of 37.0 to 37.9 in adult 06/07/2019   Coronary atherosclerosis 01/21/2024   Dysuria 04/07/2021   Encounter for osteoporosis screening in asymptomatic postmenopausal patient 06/28/2022   Essential hypertension 05/20/2023   External hemorrhoids 11/12/2021   Gastro-esophageal reflux disease without esophagitis 06/03/2019   History of kidney stones    Joint pain 05/20/2023   Mixed hyperlipidemia    Mucositis (ulcerative) of vagina and vulva 06/21/2022   Myalgia 06/07/2019   Neck pain, chronic 10/18/2022   Need for hepatitis C screening test 11/23/2023   Numbness in right leg 09/08/2023   Pain in both hands 02/08/2023   Paresthesias 09/04/2021   Paroxysmal atrial fibrillation (HCC) 11/23/2023   Primary insomnia 06/03/2019   Rectal  burning 12/03/2022   Rectal itching 12/03/2022   Rectal pain 09/04/2021   S/P aortic valve replacement with bioprosthetic valve 08/10/2020   21 mm Edwards Resilia Inspiria Bioprosthetic Tissue Valve  SN [id number] Model [id number]   S/P cervical spinal fusion 02/19/2023   Sleep apnea    cpap   Type 2 diabetes mellitus with diabetic polyneuropathy, without long-term current use of insulin  (HCC) 06/03/2019   Venous insufficiency of both lower extremities 09/08/2023    Past Surgical History:  Procedure Laterality Date   ANTERIOR CERVICAL DECOMP/DISCECTOMY FUSION     x 2   ANTERIOR CERVICAL DECOMP/DISCECTOMY FUSION N/A 02/19/2023   Procedure: Anterior Cervical Discectomy Fusion-Cervical Three-Cervical Four, Removal Plate Cervical Four-Cervical Five;  Surgeon: Joshua Alm Hamilton, MD;  Location: [facility] OR;  Service: Neurosurgery;  Laterality: N/A;  3C   AORTIC VALVE REPLACEMENT N/A 08/10/2020   Procedure: AORTIC VALVE REPLACEMENT (AVR) USING INSPIRIS VALVE SIZE ;  Surgeon: Dusty Sudie DEL, MD;  Location: [facility] OR;  Service: Open Heart Surgery;  Laterality: N/A;   BACK SURGERY  11/17/2016   L3-L5   BILATERAL CARPAL TUNNEL RELEASE     bone spur Left    left shoulder   bone spur Right    Right shoulder   CATARACT EXTRACTION Left 09/18/2021   COLONOSCOPY  08/22/2011   Moderate predominantly sigmoid diverticulosis. Small internal hemorrhoids.    COLONOSCOPY     HEMORROIDECTOMY  02/2019  LEFT HEART CATH AND CORONARY ANGIOGRAPHY N/A 01/28/2024   Procedure: LEFT HEART CATH AND CORONARY ANGIOGRAPHY;  Surgeon: Ladona Heinz, MD;  Location: [facility] INVASIVE CV LAB;  Service: Cardiovascular;  Laterality: N/A;   RIGHT/LEFT HEART CATH AND CORONARY ANGIOGRAPHY N/A 07/27/2020   Procedure: RIGHT/LEFT HEART CATH AND CORONARY ANGIOGRAPHY;  Surgeon: Wonda Sharper, MD;  Location: [facility] INVASIVE CV LAB;  Service: Cardiovascular;  Laterality: N/A;   TEE WITHOUT CARDIOVERSION N/A 08/10/2020   Procedure: TRANSESOPHAGEAL  ECHOCARDIOGRAM (TEE);  Surgeon: Dusty Sudie DEL, MD;  Location: [facility] OR;  Service: Open Heart Surgery;  Laterality: N/A;   torn rotator cuff Right    Right shoulder   torn rotator cuff Left    left shoulder   TRIGGER FINGER RELEASE Left    thumb   TRIGGER FINGER RELEASE Right    thumb and middle finger   UPPER GASTROINTESTINAL ENDOSCOPY     WRIST SURGERY Left    torn ligament   WRIST SURGERY Right    cyst    Current Medications: Current Meds  Medication Sig   ACCU-CHEK GUIDE TEST test strip USE 1 STRIP WITH METER DAILY IN  THE AFTERNOON   Accu-Chek Softclix Lancets lancets CHECK BLOOD SUGAR DAILY   acetaminophen  (TYLENOL ) 500 MG tablet Take 1,000 mg by mouth every 8 (eight) hours as needed for mild pain (pain score 1-3), moderate pain (pain score 4-6) or headache.   AMBULATORY NON FORMULARY MEDICATION Diltiazem  2% compounded with lidocaine  5% ointment applied to the rectum 3 times daily for 8 weeks.   AMBULATORY NON FORMULARY MEDICATION Nitroglycerine ointment 0.125 %. Apply a pea sized amount internally four times daily.   Blood Glucose Monitoring Suppl (ACCU-CHEK GUIDE ME) w/Device KIT Use as directed   Calcium Carb-Cholecalciferol (CALCIUM 600 + D PO) Take 2 tablets by mouth daily.   cetirizine (ZYRTEC) 10 MG tablet Take 10 mg by mouth daily.   chlorthalidone  (HYGROTON ) 25 MG tablet Take 1 tablet (25 mg total) by mouth daily.   clotrimazole -betamethasone  (LOTRISONE ) cream Apply 1 Application topically daily.   famotidine  (PEPCID ) 40 MG tablet TAKE 1 TABLET BY MOUTH AT  BEDTIME   fluticasone  (FLONASE ) 50 MCG/ACT nasal spray Place 1 spray into the nose daily as needed for allergies.   furosemide  (LASIX ) 40 MG tablet Take 40 mg (1 tablet) daily for 3 days then take 40 mg (1 tablet) every other day   Glycerin-Hypromellose-PEG 400 (DRY EYE RELIEF DROPS) 0.2-0.2-1 % SOLN Place 1 drop into both eyes daily as needed (Dry eyes).   hydrocortisone  (ANUSOL -HC) 25 MG suppository Place 1  suppository (25 mg total) rectally 2 (two) times daily.   Magnesium  250 MG CAPS Take 250 mg by mouth in the morning.   Melatonin 5 MG CAPS Take 5 mg by mouth at bedtime.   Multiple Vitamin (MULTIVITAMIN WITH MINERALS) TABS tablet Take 1 tablet by mouth daily.   Multiple Vitamins-Minerals (HAIR SKIN & NAILS) TABS Take 1 tablet by mouth in the morning.   nystatin  (MYCOSTATIN /NYSTOP ) powder Apply 1 Application topically 3 (three) times daily.   omeprazole  (PRILOSEC) 40 MG capsule TAKE 1 CAPSULE BY MOUTH TWICE  DAILY BEFORE MEALS   oxyCODONE -acetaminophen  (PERCOCET/ROXICET) 5-325 MG tablet Take 1 tablet by mouth every 4 (four) hours as needed for moderate pain (pain score 4-6) or severe pain (pain score 7-10).   pioglitazone  (ACTOS ) 30 MG tablet TAKE 1 TABLET BY MOUTH DAILY   potassium chloride  SA (KLOR-CON  M) 20 MEQ tablet TAKE 2 TABLETS BY MOUTH TWICE  DAILY   pravastatin  (PRAVACHOL ) 40 MG tablet TAKE 1 TABLET BY MOUTH AT  BEDTIME   PRESCRIPTION MEDICATION Place 1 Application rectally daily as needed (Hemorrhoids). NIFEDIPINE 5% ointment   traZODone  (DESYREL ) 100 MG tablet TAKE 1 TABLET BY MOUTH BEFORE  BEDTIME   triamcinolone  ointment (KENALOG ) 0.1 % Apply 1 Application topically 2 (two) times daily as needed (yeast / itching). Mix with nystatin    valsartan  (DIOVAN ) 320 MG tablet TAKE 1 TABLET BY MOUTH DAILY     Allergies:   Ace inhibitors, Farxiga [dapagliflozin], Jardiance [empagliflozin], Keflex [cephalexin], Latex, Doxycycline, Metformin and related, Nexium [esomeprazole magnesium ], and Protonix  [pantoprazole  sodium]   Social History   Socioeconomic History   Marital status: Married    Spouse name: Eddie   Number of children: 2   Years of education: Not on file   Highest education level: Not on file  Occupational History   Not on file  Tobacco Use   Smoking status: Never   Smokeless tobacco: Never  Vaping Use   Vaping status: Never Used  Substance and Sexual Activity   Alcohol  use: Never   Drug use: Never   Sexual activity: Yes    Birth control/protection: Post-menopausal  Other Topics Concern   Not on file  Social History Narrative   One son lives in home, the other son lives about 3 miles away   Social Drivers of Health   Financial Resource Strain: Low Risk  (09/25/2023)   Overall Financial Resource Strain (CARDIA)    Difficulty of Paying Living Expenses: Not hard at all  Food Insecurity: No Food Insecurity (09/25/2023)   Hunger Vital Sign    Worried About Running Out of Food in the Last Year: Never true    Ran Out of Food in the Last Year: Never true  Transportation Needs: No Transportation Needs (09/25/2023)   PRAPARE - Administrator, Civil Service (Medical): No    Lack of Transportation (Non-Medical): No  Physical Activity: Inactive (09/25/2023)   Exercise Vital Sign    Days of Exercise per Week: 0 days    Minutes of Exercise per Session: 0 min  Stress: No Stress Concern Present (09/25/2023)   [employer] of Occupational Health - Occupational Stress Questionnaire    Feeling of Stress : Not at all  Social Connections: Moderately Integrated (09/25/2023)   Social Connection and Isolation Panel    Frequency of Communication with Friends and Family: More than three times a week    Frequency of Social Gatherings with Friends and Family: More than three times a week    Attends Religious Services: More than 4 times per year    Active Member of [employer] or Organizations: No    Attends Engineer, structural: Never    Marital Status: Married     Family History: The patient's family history includes Colon cancer in her paternal grandmother; Colon cancer (age of onset: 27) in her brother; Diabetes in her mother; Heart Problems in her maternal grandfather and mother. There is no history of Esophageal cancer, Rectal cancer, Stomach cancer, or Breast cancer. ROS:   Please see the history of present illness.    All 14 point review of systems  negative except as described per history of present illness.  EKGs/Labs/Other Studies Reviewed:    The following studies were reviewed today:   EKG:       Recent Labs: 12/19/2023: ALT 23; Magnesium  1.7 01/19/2024: NT-Pro BNP 304 01/30/2024: BUN 12; Creatinine, Ser 0.95; Hemoglobin 11.9;  Platelets 227; Potassium 3.6; Sodium 139  Recent Lipid Panel    Component Value Date/Time   CHOL 124 11/21/2023 1039   TRIG 125 11/21/2023 1039   HDL 39 (L) 11/21/2023 1039   CHOLHDL 3.2 11/21/2023 1039   LDLCALC 63 11/21/2023 1039    Physical Exam:    VS:  BP (!) 156/78   Pulse 82   Ht 5' 6 (1.676 m)   Wt 218 lb 6.4 oz (99.1 kg)   SpO2 98%   BMI 35.25 kg/m     Wt Readings from Last 3 Encounters:  02/18/24 218 lb 6.4 oz (99.1 kg)  02/06/24 216 lb 12.8 oz (98.3 kg)  02/02/24 218 lb 9.6 oz (99.2 kg)     GENERAL:  Well nourished, well developed in no acute distress NECK: No JVD; No carotid bruits CARDIAC: RRR, S1 and S2 present, 3/6 systolic murmur best heard right upper sternal border. CHEST:  Clear to auscultation without rales, wheezing or rhonchi  Extremities: No ankle edema.  She does have mild pitting over her bilateral shins. Pulses bilaterally symmetric with radial 2+ and dorsalis pedis 1+ NEUROLOGIC:  Alert and oriented x 3  Medication Adjustments/Labs and Tests Ordered: Current medicines are reviewed at length with the patient today.  Concerns regarding medicines are outlined above.  Orders Placed This Encounter  Procedures   VAS US  LOWER EXTREMITY ARTERIAL DUPLEX   VAS US  ABI WITH/WO TBI   No orders of the defined types were placed in this encounter.   Signed, Alean jess Kobus, MD, MPH, [facility]. 02/18/2024 12:05 PM    [facility]

## 2024-02-18 NOTE — Patient Instructions (Signed)
 Medication Instructions:  Your physician recommends that you continue on your current medications as directed. Please refer to the Current Medication list given to you today.  *If you need a refill on your cardiac medications before your next appointment, please call your pharmacy*  Lab Work: None If you have labs (blood work) drawn today and your tests are completely normal, you will receive your results only by: MyChart Message (if you have MyChart) OR A paper copy in the mail If you have any lab test that is abnormal or we need to change your treatment, we will call you to review the results.  Testing/Procedures: Your physician has requested that you have an ankle brachial index (ABI). During this test an ultrasound and blood pressure cuff are used to evaluate the arteries that supply the arms and legs with blood. Allow thirty minutes for this exam. There are no restrictions or special instructions.  Please note: We ask at that you not bring children with you during ultrasound (echo/ vascular) testing. Due to room size and safety concerns, children are not allowed in the ultrasound rooms during exams. Our front office staff cannot provide observation of children in our lobby area while testing is being conducted. An adult accompanying a patient to their appointment will only be allowed in the ultrasound room at the discretion of the ultrasound technician under special circumstances. We apologize for any inconvenience.  Your physician has requested that you have a lower extremity arterial exercise duplex. During this test, exercise and ultrasound are used to evaluate arterial blood flow in the legs. Allow one hour for this exam. There are no restrictions or special instructions.   Follow-Up: At [facility], you and your health needs are our priority.  As part of our continuing mission to provide you with exceptional heart care, our providers are all part of one team.  This team  includes your primary Cardiologist (physician) and Advanced Practice Providers or APPs (Physician Assistants and Nurse Practitioners) who all work together to provide you with the care you need, when you need it.  Your next appointment:   6 month(s)  Provider:   [name], MD    We recommend signing up for the patient portal called MyChart.  Sign up information is provided on this After Visit Summary.  MyChart is used to connect with patients for Virtual Visits (Telemedicine).  Patients are able to view lab/test results, encounter notes, upcoming appointments, etc.  Non-urgent messages can be sent to your provider as well.   To learn more about what you can do with MyChart, go to [email or web address].   Other Instructions None

## 2024-02-18 NOTE — Assessment & Plan Note (Signed)
 Obtain bilateral lower extremity arterial duplex studies

## 2024-02-18 NOTE — Assessment & Plan Note (Signed)
 Continue chlorthalidone  25 mg once daily Continue valsartan  320 mg once daily. Remains suboptimal today. Advised to continue monitoring closely at home and if remains consistently elevated may consider adding low-dose amlodipine.

## 2024-02-19 ENCOUNTER — Telehealth: Payer: Self-pay

## 2024-02-19 DIAGNOSIS — M48062 Spinal stenosis, lumbar region with neurogenic claudication: Secondary | ICD-10-CM | POA: Diagnosis not present

## 2024-02-19 NOTE — Telephone Encounter (Signed)
 Copied from CRM [id number]. Topic: Clinical - Medical Advice >> Feb 19, 2024 10:42 AM Shanda MATSU wrote: Reason for CRM: Crystal w/[facility] CB: [phone] calling to adv that patient does not want to start physical therapy until she sees a back and spine specialist.

## 2024-03-04 ENCOUNTER — Ambulatory Visit: Admitting: Family Medicine

## 2024-03-04 VITALS — BP 128/74 | HR 72 | Temp 98.2°F | Ht 66.0 in | Wt 216.0 lb

## 2024-03-04 DIAGNOSIS — I5032 Chronic diastolic (congestive) heart failure: Secondary | ICD-10-CM | POA: Diagnosis not present

## 2024-03-04 DIAGNOSIS — E782 Mixed hyperlipidemia: Secondary | ICD-10-CM | POA: Diagnosis not present

## 2024-03-04 DIAGNOSIS — E1142 Type 2 diabetes mellitus with diabetic polyneuropathy: Secondary | ICD-10-CM | POA: Diagnosis not present

## 2024-03-04 DIAGNOSIS — I10 Essential (primary) hypertension: Secondary | ICD-10-CM

## 2024-03-04 DIAGNOSIS — M545 Low back pain, unspecified: Secondary | ICD-10-CM

## 2024-03-04 DIAGNOSIS — M25552 Pain in left hip: Secondary | ICD-10-CM

## 2024-03-04 LAB — POCT GLYCOSYLATED HEMOGLOBIN (HGB A1C): HbA1c POC (<> result, manual entry): 6.7 % (ref 4.0–5.6)

## 2024-03-04 MED ORDER — FUROSEMIDE 40 MG PO TABS: ORAL_TABLET | ORAL | 3 refills | Status: AC

## 2024-03-04 MED ORDER — CHLORTHALIDONE 25 MG PO TABS: 25.0000 mg | ORAL_TABLET | Freq: Every day | ORAL | 1 refills | Status: DC

## 2024-03-05 LAB — POCT LIPID PANEL
HDL: 46
LDL: 39
Non-HDL: 86
TC/HDL: 0.8
TC: 132
TRG: 239

## 2024-03-06 ENCOUNTER — Encounter: Payer: Self-pay | Admitting: Family Medicine

## 2024-03-06 DIAGNOSIS — M25552 Pain in left hip: Secondary | ICD-10-CM | POA: Insufficient documentation

## 2024-03-06 DIAGNOSIS — M545 Low back pain, unspecified: Secondary | ICD-10-CM | POA: Insufficient documentation

## 2024-03-06 NOTE — Assessment & Plan Note (Signed)
 Mixed hyperlipidemia. Currently managed with pravastatin . - Continue pravastatin  40 mg daily.  Orders:   POCT Lipid Panel

## 2024-03-06 NOTE — Assessment & Plan Note (Signed)
 At goal. Continue chlorthalidone  25 mg once daily Continue valsartan  320 mg once daily.   Orders:   chlorthalidone  (HYGROTON ) 25 MG tablet; Take 1 tablet (25 mg total) by mouth daily.

## 2024-03-06 NOTE — Assessment & Plan Note (Signed)
 Peripheral edema. No significant swelling noted during examination. - Continue Lasix  40 mg every other day. - Management per specialist.   Orders:   furosemide  (LASIX ) 40 MG tablet; Take 40 mg (1 tablet) daily for 3 days then take 40 mg (1 tablet) every other day

## 2024-03-06 NOTE — Progress Notes (Signed)
 Subjective:  Patient ID: Megan Galvan, female    DOB: December 01, 1950  Age: 73 y.o. MRN: [id number]  Chief Complaint  Patient presents with   Medical Management of Chronic Issues    HPI: Discussed the use of AI scribe software for clinical note transcription with the patient, who gave verbal consent to proceed.  History of Present Illness Megan Galvan is a 73 year old female with back pain and arthritis who presents with hip pain.  Hip pain - Onset two days ago, persistent since onset - Pain is distinct from chronic back pain - Located in the middle of the hip, described as deep inside - No prior hip x-ray performed - No relief with Biofreeze; has not tried ice - Pain interferes with ambulation  Chronic back pain and radiculopathy - History of significant lumbar arthritis and two discs impinging on nerves, previously evaluated by back specialist - Received a back injection resulting in soreness but incomplete pain relief - No numbness in legs - Toes sometimes feel numb  Lower extremity swelling - Legs not swollen in the morning but tend to swell by the end of the day - MRI performed previously to rule out blood clot, negative result  Knee pain and degenerative changes - History of knee x-ray showing bone spurs  Diabetes mellitus - Currently taking Actos  30 mg daily - Blood glucose levels in the 130s to 140s  Other symptoms and review of systems - No fever, chills, sweats, earaches, sore throat, stuffy nose, headaches, chest pain, breathing problems, or bladder issues - Bowel movements are regular       01/23/2024    9:36 AM 11/21/2023   10:08 AM 09/25/2023    2:10 PM 09/08/2023   11:17 AM 05/20/2023    9:55 AM  Depression screen PHQ 2/9  Decreased Interest 0 0 0 0 1  Down, Depressed, Hopeless 0 1 0 0 1  PHQ - 2 Score 0 1 0 0 2  Altered sleeping  1   1  Tired, decreased energy  1   1  Change in appetite  0   0  Feeling bad or failure about yourself   0   0   Trouble concentrating  0   0  Moving slowly or fidgety/restless  1   0  Suicidal thoughts  0   0  PHQ-9 Score  4    4   Difficult doing work/chores  Somewhat difficult   Somewhat difficult     Data saved with a previous flowsheet row definition        02/02/2024   10:21 AM  Fall Risk   Falls in the past year? 0  Number falls in past yr: 0  Injury with Fall? 0  Risk for fall due to : No Fall Risks  Follow up Falls evaluation completed    Patient Care Team: Sherre Clapper, MD as PCP - General (Family Medicine) Monetta Redell PARAS, MD as PCP - Cardiology (Cardiology) Nyle Rankin POUR, [facility] (Inactive) as Pharmacist (Pharmacist) Charlanne Groom, MD as Consulting Physician (Gastroenterology) Monetta Redell PARAS, MD as Consulting Physician (Cardiology) Vannie Elsie HERO, OD (Ophthalmology) Joshua Alm Hamilton, MD as Consulting Physician (Neurosurgery)   Review of Systems  All other systems reviewed and are negative.   Current Outpatient Medications on File Prior to Visit  Medication Sig Dispense Refill   ACCU-CHEK GUIDE TEST test strip USE 1 STRIP WITH METER DAILY IN  THE AFTERNOON 100 strip 2   Accu-Chek  Softclix Lancets lancets CHECK BLOOD SUGAR DAILY 100 each 2   acetaminophen  (TYLENOL ) 500 MG tablet Take 1,000 mg by mouth every 8 (eight) hours as needed for mild pain (pain score 1-3), moderate pain (pain score 4-6) or headache.     AMBULATORY NON FORMULARY MEDICATION Diltiazem  2% compounded with lidocaine  5% ointment applied to the rectum 3 times daily for 8 weeks. 1 each 0   AMBULATORY NON FORMULARY MEDICATION Nitroglycerine ointment 0.125 %. Apply a pea sized amount internally four times daily. 30 g 2   Blood Glucose Monitoring Suppl (ACCU-CHEK GUIDE ME) w/Device KIT Use as directed 1 kit 0   Calcium Carb-Cholecalciferol (CALCIUM 600 + D PO) Take 2 tablets by mouth daily.     cetirizine (ZYRTEC) 10 MG tablet Take 10 mg by mouth daily.     clotrimazole -betamethasone  (LOTRISONE ) cream  Apply 1 Application topically daily. 45 g 3   famotidine  (PEPCID ) 40 MG tablet TAKE 1 TABLET BY MOUTH AT  BEDTIME 100 tablet 2   fluticasone  (FLONASE ) 50 MCG/ACT nasal spray Place 1 spray into the nose daily as needed for allergies.     Glycerin-Hypromellose-PEG 400 (DRY EYE RELIEF DROPS) 0.2-0.2-1 % SOLN Place 1 drop into both eyes daily as needed (Dry eyes).     hydrocortisone  (ANUSOL -HC) 25 MG suppository Place 1 suppository (25 mg total) rectally 2 (two) times daily. 30 suppository 2   Magnesium  250 MG CAPS Take 250 mg by mouth in the morning.     Melatonin 5 MG CAPS Take 5 mg by mouth at bedtime.     Multiple Vitamin (MULTIVITAMIN WITH MINERALS) TABS tablet Take 1 tablet by mouth daily.     Multiple Vitamins-Minerals (HAIR SKIN & NAILS) TABS Take 1 tablet by mouth in the morning.     nystatin  (MYCOSTATIN /NYSTOP ) powder Apply 1 Application topically 3 (three) times daily. 60 g 3   omeprazole  (PRILOSEC) 40 MG capsule TAKE 1 CAPSULE BY MOUTH TWICE  DAILY BEFORE MEALS 200 capsule 2   oxyCODONE -acetaminophen  (PERCOCET/ROXICET) 5-325 MG tablet Take 1 tablet by mouth every 4 (four) hours as needed for moderate pain (pain score 4-6) or severe pain (pain score 7-10).     pioglitazone  (ACTOS ) 30 MG tablet TAKE 1 TABLET BY MOUTH DAILY 100 tablet 2   potassium chloride  SA (KLOR-CON  M) 20 MEQ tablet TAKE 2 TABLETS BY MOUTH TWICE  DAILY 400 tablet 2   pravastatin  (PRAVACHOL ) 40 MG tablet TAKE 1 TABLET BY MOUTH AT  BEDTIME 100 tablet 2   PRESCRIPTION MEDICATION Place 1 Application rectally daily as needed (Hemorrhoids). NIFEDIPINE 5% ointment     traZODone  (DESYREL ) 100 MG tablet TAKE 1 TABLET BY MOUTH BEFORE  BEDTIME 90 tablet 3   triamcinolone  ointment (KENALOG ) 0.1 % Apply 1 Application topically 2 (two) times daily as needed (yeast / itching). Mix with nystatin      valsartan  (DIOVAN ) 320 MG tablet TAKE 1 TABLET BY MOUTH DAILY 100 tablet 2   No current facility-administered medications on file prior to  visit.   Past Medical History:  Diagnosis Date   Acute cystitis without hematuria 06/21/2022   Acute pain of right knee 02/02/2024   Allergic sinusitis 08/27/2022   Atrophic vaginitis 03/31/2021   Candidiasis, intertrigo 12/21/2023   Cataract    Chronic diastolic CHF (congestive heart failure), NYHA class 2 (HCC) 11/17/2023   Class 1 obesity due to excess calories with serious comorbidity and body mass index (BMI) of 33.0 to 33.9 in adult 03/28/2022   Class 1 obesity  due to excess calories with serious comorbidity and body mass index (BMI) of 34.0 to 34.9 in adult 03/28/2022   Class 2 severe obesity with serious comorbidity and body mass index (BMI) of 37.0 to 37.9 in adult 06/07/2019   Coronary atherosclerosis 01/21/2024   Dysuria 04/07/2021   Encounter for osteoporosis screening in asymptomatic postmenopausal patient 06/28/2022   Essential hypertension 05/20/2023   External hemorrhoids 11/12/2021   Gastro-esophageal reflux disease without esophagitis 06/03/2019   History of kidney stones    Joint pain 05/20/2023   Mixed hyperlipidemia    Mucositis (ulcerative) of vagina and vulva 06/21/2022   Myalgia 06/07/2019   Neck pain, chronic 10/18/2022   Need for hepatitis C screening test 11/23/2023   Numbness in right leg 09/08/2023   Pain in both hands 02/08/2023   Paresthesias 09/04/2021   Paroxysmal atrial fibrillation (HCC) 11/23/2023   Primary insomnia 06/03/2019   Rectal burning 12/03/2022   Rectal itching 12/03/2022   Rectal pain 09/04/2021   S/P aortic valve replacement with bioprosthetic valve 08/10/2020   21 mm Edwards Resilia Inspiria Bioprosthetic Tissue Valve  SN [id number] Model [id number]   S/P cervical spinal fusion 02/19/2023   Sleep apnea    cpap   Type 2 diabetes mellitus with diabetic polyneuropathy, without long-term current use of insulin  (HCC) 06/03/2019   Venous insufficiency of both lower extremities 09/08/2023   Past Surgical History:  Procedure Laterality  Date   ANTERIOR CERVICAL DECOMP/DISCECTOMY FUSION     x 2   ANTERIOR CERVICAL DECOMP/DISCECTOMY FUSION N/A 02/19/2023   Procedure: Anterior Cervical Discectomy Fusion-Cervical Three-Cervical Four, Removal Plate Cervical Four-Cervical Five;  Surgeon: Joshua Alm Hamilton, MD;  Location: [facility] OR;  Service: Neurosurgery;  Laterality: N/A;  3C   AORTIC VALVE REPLACEMENT N/A 08/10/2020   Procedure: AORTIC VALVE REPLACEMENT (AVR) USING INSPIRIS VALVE SIZE ;  Surgeon: Dusty Sudie DEL, MD;  Location: [facility] OR;  Service: Open Heart Surgery;  Laterality: N/A;   BACK SURGERY  11/17/2016   L3-L5   BILATERAL CARPAL TUNNEL RELEASE     bone spur Left    left shoulder   bone spur Right    Right shoulder   CATARACT EXTRACTION Left 09/18/2021   COLONOSCOPY  08/22/2011   Moderate predominantly sigmoid diverticulosis. Small internal hemorrhoids.    COLONOSCOPY     HEMORROIDECTOMY  02/2019   LEFT HEART CATH AND CORONARY ANGIOGRAPHY N/A 01/28/2024   Procedure: LEFT HEART CATH AND CORONARY ANGIOGRAPHY;  Surgeon: Ladona Heinz, MD;  Location: [facility] INVASIVE CV LAB;  Service: Cardiovascular;  Laterality: N/A;   RIGHT/LEFT HEART CATH AND CORONARY ANGIOGRAPHY N/A 07/27/2020   Procedure: RIGHT/LEFT HEART CATH AND CORONARY ANGIOGRAPHY;  Surgeon: Wonda Sharper, MD;  Location[facility] INVASIVE CV LAB;  Service: Cardiovascular;  Laterality: N/A;   TEE WITHOUT CARDIOVERSION N/A 08/10/2020   Procedure: TRANSESOPHAGEAL ECHOCARDIOGRAM (TEE);  Surgeon: Dusty Sudie DEL, MD;  Location: [facility] OR;  Service: Open Heart Surgery;  Laterality: N/A;   torn rotator cuff Right    Right shoulder   torn rotator cuff Left    left shoulder   TRIGGER FINGER RELEASE Left    thumb   TRIGGER FINGER RELEASE Right    thumb and middle finger   UPPER GASTROINTESTINAL ENDOSCOPY     WRIST SURGERY Left    torn ligament   WRIST SURGERY Right    cyst    Family History  Problem Relation Age of Onset   Diabetes Mother    Heart Problems Mother    Colon  cancer Brother 60   Heart Problems Maternal Grandfather    Colon cancer Paternal Grandmother    Esophageal cancer Neg Hx    Rectal cancer Neg Hx    Stomach cancer Neg Hx    Breast cancer Neg Hx    Social History   Socioeconomic History   Marital status: Married    Spouse name: Eddie   Number of children: 2   Years of education: Not on file   Highest education level: Not on file  Occupational History   Not on file  Tobacco Use   Smoking status: Never   Smokeless tobacco: Never  Vaping Use   Vaping status: Never Used  Substance and Sexual Activity   Alcohol use: Never   Drug use: Never   Sexual activity: Yes    Birth control/protection: Post-menopausal  Other Topics Concern   Not on file  Social History Narrative   One son lives in home, the other son lives about 3 miles away   Social Drivers of Health   Financial Resource Strain: Low Risk  (09/25/2023)   Overall Financial Resource Strain (CARDIA)    Difficulty of Paying Living Expenses: Not hard at all  Food Insecurity: No Food Insecurity (09/25/2023)   Hunger Vital Sign    Worried About Running Out of Food in the Last Year: Never true    Ran Out of Food in the Last Year: Never true  Transportation Needs: No Transportation Needs (09/25/2023)   PRAPARE - Administrator, Civil Service (Medical): No    Lack of Transportation (Non-Medical): No  Physical Activity: Inactive (09/25/2023)   Exercise Vital Sign    Days of Exercise per Week: 0 days    Minutes of Exercise per Session: 0 min  Stress: No Stress Concern Present (09/25/2023)   [employer] of Occupational Health - Occupational Stress Questionnaire    Feeling of Stress : Not at all  Social Connections: Moderately Integrated (09/25/2023)   Social Connection and Isolation Panel    Frequency of Communication with Friends and Family: More than three times a week    Frequency of Social Gatherings with Friends and Family: More than three times a week     Attends Religious Services: More than 4 times per year    Active Member of [employer] or Organizations: No    Attends Banker Meetings: Never    Marital Status: Married    Objective:  BP 128/74   Pulse 72   Temp 98.2 F (36.8 C)   Ht 5' 6 (1.676 m)   Wt 216 lb (98 kg)   SpO2 98%   BMI 34.86 kg/m      03/04/2024   10:10 AM 02/18/2024   11:10 AM 02/06/2024    3:13 PM  BP/Weight  Systolic BP 128 156 130  Diastolic BP 74 78 68  Wt. (Lbs) 216 218.4   BMI 34.86 kg/m2 35.25 kg/m2     Physical Exam Vitals reviewed.  Constitutional:      Appearance: Normal appearance. She is obese.  Neck:     Vascular: No carotid bruit.  Cardiovascular:     Rate and Rhythm: Normal rate and regular rhythm.     Heart sounds: Normal heart sounds.  Pulmonary:     Effort: Pulmonary effort is normal. No respiratory distress.     Breath sounds: Normal breath sounds.  Abdominal:     General: Abdomen is flat. Bowel sounds are normal.     Palpations: Abdomen is  soft.     Tenderness: There is no abdominal tenderness.  Neurological:     Mental Status: She is alert and oriented to person, place, and time.  Psychiatric:        Mood and Affect: Mood normal.        Behavior: Behavior normal.         Lab Results  Component Value Date   WBC 7.7 01/30/2024   HGB 11.9 (L) 01/30/2024   HCT 36.0 01/30/2024   PLT 227 01/30/2024   GLUCOSE 151 (H) 01/30/2024   CHOL 124 11/21/2023   TRIG 125 11/21/2023   HDL 39 (L) 11/21/2023   LDLCALC 63 11/21/2023   ALT 23 12/19/2023   AST 29 12/19/2023   NA 139 01/30/2024   K 3.6 01/30/2024   CL 100 01/30/2024   CREATININE 0.95 01/30/2024   BUN 12 01/30/2024   CO2 30 01/30/2024   TSH 2.100 02/04/2023   INR 1.0 02/13/2023   HGBA1C 6.7 03/04/2024    Results for orders placed or performed in visit on 03/04/24  HM DIABETES EYE EXAM   Collection Time: 01/14/24 12:00 AM  Result Value Ref Range   HM Diabetic Eye Exam No Retinopathy No Retinopathy   POCT Lipid Panel   Collection Time: 03/04/24 12:11 PM  Result Value Ref Range   TC 132    HDL 46    TRG 239    LDL 39    Non-HDL 86    TC/HDL 0.8   POCT glycosylated hemoglobin (Hb A1C)   Collection Time: 03/04/24 12:13 PM  Result Value Ref Range   Hemoglobin A1C     HbA1c POC (<> result, manual entry) 6.7 4.0 - 5.6 %   HbA1c, POC (prediabetic range)     HbA1c, POC (controlled diabetic range)    .  Assessment & Plan:   Assessment & Plan Type 2 diabetes mellitus with diabetic polyneuropathy, without long-term current use of insulin  (HCC) Type 2 diabetes mellitus control improved.  - POCT A1C 6.7. - Blood sugars are generally well-controlled, running in the 130s and 140s.  - Recent injection may have caused temporary elevation in blood sugars. - Continue Actos  30 mg daily.  Orders:   POCT glycosylated hemoglobin (Hb A1C)  Mixed hyperlipidemia Mixed hyperlipidemia. Currently managed with pravastatin . - Continue pravastatin  40 mg daily.  Orders:   POCT Lipid Panel  Chronic diastolic CHF (congestive heart failure), NYHA class 2 (HCC) Peripheral edema. No significant swelling noted during examination. - Continue Lasix  40 mg every other day. - Management per specialist.   Orders:   furosemide  (LASIX ) 40 MG tablet; Take 40 mg (1 tablet) daily for 3 days then take 40 mg (1 tablet) every other day  Essential hypertension At goal. Continue chlorthalidone  25 mg once daily Continue valsartan  320 mg once daily.   Orders:   chlorthalidone  (HYGROTON ) 25 MG tablet; Take 1 tablet (25 mg total) by mouth daily.   Left hip pain Hip pain likely referred from lumbar spine. Pain is located in the middle of the hip and is not exacerbated by hip movement, suggesting a non-hip origin. - Apply ice to the affected area. - Continue follow-up with Dr. Joshua for further evaluation.    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 Recent injection by Dr. Dartha resulted in soreness but no significant improvement. - Continue follow-up with Dr. Joshua for further evaluation and management.  Body mass index is 34.86 kg/m.      Meds ordered this encounter  Medications   furosemide  (LASIX ) 40 MG tablet    Sig: Take 40 mg (1 tablet) daily for 3 days then take 40 mg (1 tablet) every other day    Dispense:  48 tablet    Refill:  3   chlorthalidone  (HYGROTON ) 25 MG tablet    Sig: Take 1 tablet (25 mg total) by mouth daily.    Dispense:  90 tablet    Refill:  1    Please send a replace/new response with 100-Day Supply if appropriate to maximize member benefit. Requesting 1 year supply. Dose decreased to 25 mg daily    Orders Placed This Encounter  Procedures   POCT glycosylated hemoglobin (Hb A1C)   POCT Lipid Panel   HM DIABETES EYE EXAM    Follow-up: Return in about 3 months (around 06/04/2024) for chronic fasting.  An After Visit Summary was printed and given to the patient.  Abigail Free, MD [facility] [phone]

## 2024-03-06 NOTE — Assessment & Plan Note (Signed)
 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 Recent injection by Dr. Dartha resulted in soreness but no significant improvement. - Continue follow-up with Dr. Joshua for further evaluation and management.

## 2024-03-06 NOTE — Assessment & Plan Note (Signed)
 Hip pain likely referred from lumbar spine. Pain is located in the middle of the hip and is not exacerbated by hip movement, suggesting a non-hip origin. - Apply ice to the affected area. - Continue follow-up with Dr. Joshua for further evaluation.

## 2024-03-06 NOTE — Assessment & Plan Note (Signed)
 Type 2 diabetes mellitus control improved.  - POCT A1C 6.7. - Blood sugars are generally well-controlled, running in the 130s and 140s.  - Recent injection may have caused temporary elevation in blood sugars. - Continue Actos  30 mg daily.  Orders:   POCT glycosylated hemoglobin (Hb A1C)

## 2024-03-11 ENCOUNTER — Ambulatory Visit

## 2024-03-11 DIAGNOSIS — E782 Mixed hyperlipidemia: Secondary | ICD-10-CM

## 2024-03-11 DIAGNOSIS — I739 Peripheral vascular disease, unspecified: Secondary | ICD-10-CM

## 2024-03-11 LAB — VAS US ABI WITH/WO TBI
Left ABI: 0.98
Right ABI: 0.98

## 2024-03-11 NOTE — Telephone Encounter (Signed)
 Copied from CRM [id number]. Topic: Referral - Question >> Mar 11, 2024  2:36 PM Avram MATSU wrote: Reason for CRM: Patient spoke with the provider about seeing an ortho doctor and pt would like to be referred to MD [facility]. Please advise [phone]

## 2024-03-12 ENCOUNTER — Other Ambulatory Visit: Payer: Self-pay | Admitting: Family Medicine

## 2024-03-12 DIAGNOSIS — M25561 Pain in right knee: Secondary | ICD-10-CM

## 2024-03-15 NOTE — Telephone Encounter (Signed)
Spoke with patient, verbalized understanding and had no questions at this time.  

## 2024-03-22 ENCOUNTER — Ambulatory Visit (INDEPENDENT_AMBULATORY_CARE_PROVIDER_SITE_OTHER): Admitting: Physician Assistant

## 2024-03-22 ENCOUNTER — Encounter: Payer: Self-pay | Admitting: Physician Assistant

## 2024-03-22 VITALS — BP 136/60 | HR 87 | Temp 97.9°F | Resp 18 | Ht 66.0 in | Wt 219.2 lb

## 2024-03-22 DIAGNOSIS — B3731 Acute candidiasis of vulva and vagina: Secondary | ICD-10-CM

## 2024-03-22 DIAGNOSIS — N342 Other urethritis: Secondary | ICD-10-CM

## 2024-03-22 LAB — POCT URINALYSIS DIP (CLINITEK)
Bilirubin, UA: NEGATIVE
Blood, UA: NEGATIVE
Glucose, UA: NEGATIVE mg/dL
Ketones, POC UA: NEGATIVE mg/dL
Leukocytes, UA: NEGATIVE
Nitrite, UA: NEGATIVE
POC PROTEIN,UA: NEGATIVE
Spec Grav, UA: 1.01 (ref 1.010–1.025)
Urobilinogen, UA: 0.2 U/dL
pH, UA: 8.5 — AB (ref 5.0–8.0)

## 2024-03-22 MED ORDER — CIPROFLOXACIN HCL 250 MG PO TABS: 250.0000 mg | ORAL_TABLET | Freq: Two times a day (BID) | ORAL | 0 refills | Status: AC

## 2024-03-22 MED ORDER — KETOCONAZOLE 2 % EX CREA: 1.0000 | TOPICAL_CREAM | Freq: Every day | CUTANEOUS | 1 refills | Status: AC

## 2024-03-22 MED ORDER — FLUCONAZOLE 150 MG PO TABS: ORAL_TABLET | ORAL | 0 refills | Status: DC

## 2024-03-22 NOTE — Progress Notes (Signed)
 Acute Office Visit  Subjective:    Patient ID: Megan Galvan, female    DOB: [birth year], 73 y.o.   MRN: [id number]  Chief Complaint  Patient presents with   Dysuria   Anal Itching   Vaginal Itching    HPI: Patient is in today for complaints of urine urgency and dysuria for the past few days.  She has also had external vaginal and rectal itching Denies diarrhea.  No pelvic or abdominal pain Denies fever, nausea, vomiting No vaginal pain or discharge   Current Outpatient Medications:    Accu-Chek Softclix Lancets lancets, CHECK BLOOD SUGAR DAILY, Disp: 100 each, Rfl: 2   acetaminophen  (TYLENOL ) 500 MG tablet, Take 1,000 mg by mouth every 8 (eight) hours as needed for mild pain (pain score 1-3), moderate pain (pain score 4-6) or headache., Disp: , Rfl:    AMBULATORY NON FORMULARY MEDICATION, Diltiazem  2% compounded with lidocaine  5% ointment applied to the rectum 3 times daily for 8 weeks., Disp: 1 each, Rfl: 0   AMBULATORY NON FORMULARY MEDICATION, Nitroglycerine ointment 0.125 %. Apply a pea sized amount internally four times daily., Disp: 30 g, Rfl: 2   Blood Glucose Monitoring Suppl (ACCU-CHEK GUIDE ME) w/Device KIT, Use as directed, Disp: 1 kit, Rfl: 0   Calcium Carb-Cholecalciferol (CALCIUM 600 + D PO), Take 2 tablets by mouth daily., Disp: , Rfl:    cetirizine (ZYRTEC) 10 MG tablet, Take 10 mg by mouth daily., Disp: , Rfl:    chlorthalidone  (HYGROTON ) 25 MG tablet, Take 1 tablet (25 mg total) by mouth daily., Disp: 90 tablet, Rfl: 1   ciprofloxacin  (CIPRO ) 250 MG tablet, Take 1 tablet (250 mg total) by mouth 2 (two) times daily for 3 days., Disp: 6 tablet, Rfl: 0   famotidine  (PEPCID ) 40 MG tablet, TAKE 1 TABLET BY MOUTH AT  BEDTIME, Disp: 100 tablet, Rfl: 2   fluconazole  (DIFLUCAN ) 150 MG tablet, 1 po then repeat once in 3 days, Disp: 2 tablet, Rfl: 0   fluticasone  (FLONASE ) 50 MCG/ACT nasal spray, Place 1 spray into the nose daily as needed for allergies., Disp: , Rfl:     furosemide  (LASIX ) 40 MG tablet, Take 40 mg (1 tablet) daily for 3 days then take 40 mg (1 tablet) every other day, Disp: 48 tablet, Rfl: 3   Glycerin-Hypromellose-PEG 400 (DRY EYE RELIEF DROPS) 0.2-0.2-1 % SOLN, Place 1 drop into both eyes daily as needed (Dry eyes)., Disp: , Rfl:    hydrocortisone  (ANUSOL -HC) 25 MG suppository, Place 1 suppository (25 mg total) rectally 2 (two) times daily., Disp: 30 suppository, Rfl: 2   ketoconazole  (NIZORAL ) 2 % cream, Apply 1 Application topically daily., Disp: 30 g, Rfl: 1   Magnesium  250 MG CAPS, Take 250 mg by mouth in the morning., Disp: , Rfl:    Melatonin 5 MG CAPS, Take 5 mg by mouth at bedtime., Disp: , Rfl:    Multiple Vitamin (MULTIVITAMIN WITH MINERALS) TABS tablet, Take 1 tablet by mouth daily., Disp: , Rfl:    Multiple Vitamins-Minerals (HAIR SKIN & NAILS) TABS, Take 1 tablet by mouth in the morning., Disp: , Rfl:    omeprazole  (PRILOSEC) 40 MG capsule, TAKE 1 CAPSULE BY MOUTH TWICE  DAILY BEFORE MEALS, Disp: 200 capsule, Rfl: 2   pioglitazone  (ACTOS ) 30 MG tablet, TAKE 1 TABLET BY MOUTH DAILY, Disp: 100 tablet, Rfl: 2   potassium chloride  SA (KLOR-CON  M) 20 MEQ tablet, TAKE 2 TABLETS BY MOUTH TWICE  DAILY, Disp: 400 tablet, Rfl: 2  pravastatin  (PRAVACHOL ) 40 MG tablet, TAKE 1 TABLET BY MOUTH AT  BEDTIME, Disp: 100 tablet, Rfl: 2   PRESCRIPTION MEDICATION, Place 1 Application rectally daily as needed (Hemorrhoids). NIFEDIPINE 5% ointment, Disp: , Rfl:    traZODone  (DESYREL ) 100 MG tablet, TAKE 1 TABLET BY MOUTH BEFORE  BEDTIME, Disp: 90 tablet, Rfl: 3   triamcinolone  ointment (KENALOG ) 0.1 %, Apply 1 Application topically 2 (two) times daily as needed (yeast / itching). Mix with nystatin , Disp: , Rfl:    valsartan  (DIOVAN ) 320 MG tablet, TAKE 1 TABLET BY MOUTH DAILY, Disp: 100 tablet, Rfl: 2   ACCU-CHEK GUIDE TEST test strip, USE 1 STRIP WITH METER DAILY IN  THE AFTERNOON, Disp: 100 strip, Rfl: 2   oxyCODONE -acetaminophen  (PERCOCET/ROXICET) 5-325  MG tablet, Take 1 tablet by mouth every 4 (four) hours as needed for moderate pain (pain score 4-6) or severe pain (pain score 7-10)., Disp: , Rfl:   Allergies  Allergen Reactions   Ace Inhibitors Cough   Farxiga [Dapagliflozin] Other (See Comments)    Yeast infections    Jardiance [Empagliflozin] Other (See Comments)    Yeast infections   Keflex [Cephalexin] Itching   Latex Other (See Comments)    Burn and itch   Doxycycline Rash   Metformin And Related Diarrhea   Nexium [Esomeprazole Magnesium ] Diarrhea   Protonix  [Pantoprazole  Sodium] Other (See Comments)    Constipation    ROS CONSTITUTIONAL: Negative for chills, fatigue, fever,   CARDIOVASCULAR: Negative for chest pain,  RESPIRATORY: Negative for recent cough and dyspnea.  GASTROINTESTINAL: Negative for abdominal pain, acid reflux symptoms, constipation, diarrhea, nausea and vomiting.  GI - see HPI      Objective:    PHYSICAL EXAM:   BP 136/60   Pulse 87   Temp 97.9 F (36.6 C) (Temporal)   Resp 18   Ht 5' 6 (1.676 m)   Wt 219 lb 3.2 oz (99.4 kg)   SpO2 97%   BMI 35.38 kg/m    GEN: Well nourished, well developed, in no acute distress   Cardiac: RRR; no murmurs,  Respiratory:  normal respiratory rate and pattern with no distress - normal breath sounds with no rales, rhonchi, wheezes or rubs GI: normal bowel sounds, no masses or tenderness GU - external genitalia with erythema noted  Office Visit on 03/22/2024  Component Date Value Ref Range Status   Color, UA 03/22/2024 yellow  yellow Final   Clarity, UA 03/22/2024 clear  clear Final   Glucose, UA 03/22/2024 negative  negative mg/dL Final   Bilirubin, UA 88/75/7974 negative  negative Final   Ketones, POC UA 03/22/2024 negative  negative mg/dL Final   Spec Grav, UA 88/75/7974 1.010  1.010 - 1.025 Final   Blood, UA 03/22/2024 negative  negative Final   pH, UA 03/22/2024 8.5 (A)  5.0 - 8.0 Final   POC PROTEIN,UA 03/22/2024 negative  negative, trace  Final   Urobilinogen, UA 03/22/2024 0.2  0.2 or 1.0 E.U./dL Final   Nitrite, UA 88/75/7974 Negative  Negative Final   Leukocytes, UA 03/22/2024 Negative  Negative Final        Assessment & Plan:    Urethritis -     POCT URINALYSIS DIP (CLINITEK) -     Ciprofloxacin  HCl; Take 1 tablet (250 mg total) by mouth 2 (two) times daily for 3 days.  Dispense: 6 tablet; Refill: 0  Yeast vaginitis -     Fluconazole ; 1 po then repeat once in 3 days  Dispense: 2 tablet;  Refill: 0 -     Ketoconazole ; Apply 1 Application topically daily.  Dispense: 30 g; Refill: 1     Follow-up: Return if symptoms worsen or fail to improve.  An After Visit Summary was printed and given to the patient.  CAMIE JONELLE NICHOLAUS DEVONNA [facility] [phone]

## 2024-03-27 ENCOUNTER — Other Ambulatory Visit: Payer: Self-pay | Admitting: Family Medicine

## 2024-03-29 ENCOUNTER — Telehealth: Payer: Self-pay

## 2024-03-29 NOTE — Telephone Encounter (Signed)
 Copied from CRM [id number]. Topic: General - Other >> Mar 29, 2024 11:13 AM Zebedee SAUNDERS wrote: Reason for CRM: Pt want to let Dr. Sherre know that cyst is still on left arm. Dr. Elmer to order imaging. Please call pt at [phone]

## 2024-03-29 NOTE — Telephone Encounter (Signed)
Left message for patient to call our office to schedule appointment

## 2024-03-31 ENCOUNTER — Ambulatory Visit: Admitting: Family Medicine

## 2024-03-31 VITALS — BP 130/82 | HR 79 | Temp 98.1°F | Ht 66.0 in | Wt 213.0 lb

## 2024-03-31 DIAGNOSIS — M79602 Pain in left arm: Secondary | ICD-10-CM

## 2024-03-31 DIAGNOSIS — R2232 Localized swelling, mass and lump, left upper limb: Secondary | ICD-10-CM | POA: Diagnosis not present

## 2024-03-31 DIAGNOSIS — K219 Gastro-esophageal reflux disease without esophagitis: Secondary | ICD-10-CM

## 2024-03-31 DIAGNOSIS — B3731 Acute candidiasis of vulva and vagina: Secondary | ICD-10-CM | POA: Diagnosis not present

## 2024-03-31 MED ORDER — FLUCONAZOLE 150 MG PO TABS: ORAL_TABLET | ORAL | 0 refills | Status: DC

## 2024-03-31 NOTE — Progress Notes (Unsigned)
 Acute Office Visit  Subjective:    Patient ID: Megan Galvan, female    DOB: 11-Jun-1950, 73 y.o.   MRN: [id number]  Chief Complaint  Patient presents with   Cyst    Left forearm x  1 week     Discussed the use of AI scribe software for clinical note transcription with the patient, who gave verbal consent to proceed.  History of Present Illness Megan Galvan is a 73 year old female who presents with a sore lump on her arm.  Painful arm lump - Sore, swollen lump on the arm first noticed during a previous clinic visit for a different issue - Tender and painful to palpation - Confirmed by another individual who palpated the area - No recollection of trauma or injury to the arm - No recent trauma or injury reported  Analgesic use and gastrointestinal symptoms - Takes Tylenol  regularly for pain management - Previously used Aleve but discontinued due to stomach irritation - Takes two omeprazole  daily and an additional medication at night for stomach problems - History of stomach irritation and prior stomach examination - Currently on aspirin  but not on any other blood thinners - History of gastrointestinal symptoms influenced discontinuation of NSAIDs    Past Medical History:  Diagnosis Date   Acute cystitis without hematuria 06/21/2022   Acute pain of right knee 02/02/2024   Allergic sinusitis 08/27/2022   Atrophic vaginitis 03/31/2021   Candidiasis, intertrigo 12/21/2023   Cataract    Chronic diastolic CHF (congestive heart failure), NYHA class 2 (HCC) 11/17/2023   Class 1 obesity due to excess calories with serious comorbidity and body mass index (BMI) of 33.0 to 33.9 in adult 03/28/2022   Class 1 obesity due to excess calories with serious comorbidity and body mass index (BMI) of 34.0 to 34.9 in adult 03/28/2022   Class 2 severe obesity with serious comorbidity and body mass index (BMI) of 37.0 to 37.9 in adult 06/07/2019   Coronary atherosclerosis 01/21/2024    Dysuria 04/07/2021   Encounter for osteoporosis screening in asymptomatic postmenopausal patient 06/28/2022   Essential hypertension 05/20/2023   External hemorrhoids 11/12/2021   Gastro-esophageal reflux disease without esophagitis 06/03/2019   History of kidney stones    Joint pain 05/20/2023   Mixed hyperlipidemia    Mucositis (ulcerative) of vagina and vulva 06/21/2022   Myalgia 06/07/2019   Neck pain, chronic 10/18/2022   Need for hepatitis C screening test 11/23/2023   Numbness in right leg 09/08/2023   Pain in both hands 02/08/2023   Paresthesias 09/04/2021   Paroxysmal atrial fibrillation (HCC) 11/23/2023   Primary insomnia 06/03/2019   Rectal burning 12/03/2022   Rectal itching 12/03/2022   Rectal pain 09/04/2021   S/P aortic valve replacement with bioprosthetic valve 08/10/2020   21 mm Edwards Resilia Inspiria Bioprosthetic Tissue Valve  SN [id number] Model [id number]   S/P cervical spinal fusion 02/19/2023   Sleep apnea    cpap   Type 2 diabetes mellitus with diabetic polyneuropathy, without long-term current use of insulin  (HCC) 06/03/2019   Venous insufficiency of both lower extremities 09/08/2023    Past Surgical History:  Procedure Laterality Date   ANTERIOR CERVICAL DECOMP/DISCECTOMY FUSION     x 2   ANTERIOR CERVICAL DECOMP/DISCECTOMY FUSION N/A 02/19/2023   Procedure: Anterior Cervical Discectomy Fusion-Cervical Three-Cervical Four, Removal Plate Cervical Four-Cervical Five;  Surgeon: Joshua Alm Hamilton, MD;  Location: [facility] OR;  Service: Neurosurgery;  Laterality: N/A;  3C   AORTIC VALVE REPLACEMENT N/A  08/10/2020   Procedure: AORTIC VALVE REPLACEMENT (AVR) USING INSPIRIS VALVE SIZE ;  Surgeon: Dusty Sudie DEL, MD;  Location: [facility] OR;  Service: Open Heart Surgery;  Laterality: N/A;   BACK SURGERY  11/17/2016   L3-L5   BILATERAL CARPAL TUNNEL RELEASE     bone spur Left    left shoulder   bone spur Right    Right shoulder   CATARACT EXTRACTION Left 09/18/2021    COLONOSCOPY  08/22/2011   Moderate predominantly sigmoid diverticulosis. Small internal hemorrhoids.    COLONOSCOPY     HEMORROIDECTOMY  02/2019   LEFT HEART CATH AND CORONARY ANGIOGRAPHY N/A 01/28/2024   Procedure: LEFT HEART CATH AND CORONARY ANGIOGRAPHY;  Surgeon: Ladona Heinz, MD;  Location: [facility] INVASIVE CV LAB;  Service: Cardiovascular;  Laterality: N/A;   RIGHT/LEFT HEART CATH AND CORONARY ANGIOGRAPHY N/A 07/27/2020   Procedure: RIGHT/LEFT HEART CATH AND CORONARY ANGIOGRAPHY;  Surgeon: Wonda Sharper, MD;  Location: [facility] INVASIVE CV LAB;  Service: Cardiovascular;  Laterality: N/A;   TEE WITHOUT CARDIOVERSION N/A 08/10/2020   Procedure: TRANSESOPHAGEAL ECHOCARDIOGRAM (TEE);  Surgeon: Dusty Sudie DEL, MD;  Location: [facility] OR;  Service: Open Heart Surgery;  Laterality: N/A;   torn rotator cuff Right    Right shoulder   torn rotator cuff Left    left shoulder   TRIGGER FINGER RELEASE Left    thumb   TRIGGER FINGER RELEASE Right    thumb and middle finger   UPPER GASTROINTESTINAL ENDOSCOPY     WRIST SURGERY Left    torn ligament   WRIST SURGERY Right    cyst    Family History  Problem Relation Age of Onset   Diabetes Mother    Heart Problems Mother    Colon cancer Brother 79   Heart Problems Maternal Grandfather    Colon cancer Paternal Grandmother    Esophageal cancer Neg Hx    Rectal cancer Neg Hx    Stomach cancer Neg Hx    Breast cancer Neg Hx     Social History   Socioeconomic History   Marital status: Married    Spouse name: Eddie   Number of children: 2   Years of education: Not on file   Highest education level: Not on file  Occupational History   Not on file  Tobacco Use   Smoking status: Never   Smokeless tobacco: Never  Vaping Use   Vaping status: Never Used  Substance and Sexual Activity   Alcohol use: Never   Drug use: Never   Sexual activity: Yes    Birth control/protection: Post-menopausal  Other Topics Concern   Not on file  Social History  Narrative   One son lives in home, the other son lives about 3 miles away   Social Drivers of Health   Financial Resource Strain: Low Risk  (09/25/2023)   Overall Financial Resource Strain (CARDIA)    Difficulty of Paying Living Expenses: Not hard at all  Food Insecurity: No Food Insecurity (09/25/2023)   Hunger Vital Sign    Worried About Running Out of Food in the Last Year: Never true    Ran Out of Food in the Last Year: Never true  Transportation Needs: No Transportation Needs (09/25/2023)   PRAPARE - Administrator, Civil Service (Medical): No    Lack of Transportation (Non-Medical): No  Physical Activity: Inactive (09/25/2023)   Exercise Vital Sign    Days of Exercise per Week: 0 days    Minutes of Exercise per  Session: 0 min  Stress: No Stress Concern Present (09/25/2023)   [employer] of Occupational Health - Occupational Stress Questionnaire    Feeling of Stress : Not at all  Social Connections: Moderately Integrated (09/25/2023)   Social Connection and Isolation Panel    Frequency of Communication with Friends and Family: More than three times a week    Frequency of Social Gatherings with Friends and Family: More than three times a week    Attends Religious Services: More than 4 times per year    Active Member of [employer] or Organizations: No    Attends Banker Meetings: Never    Marital Status: Married  Catering Manager Violence: Not At Risk (09/25/2023)   Humiliation, Afraid, Rape, and Kick questionnaire    Fear of Current or Ex-Partner: No    Emotionally Abused: No    Physically Abused: No    Sexually Abused: No    Outpatient Medications Prior to Visit  Medication Sig Dispense Refill   ACCU-CHEK GUIDE TEST test strip USE 1 STRIP WITH METER DAILY IN  THE AFTERNOON 100 strip 2   Accu-Chek Softclix Lancets lancets CHECK BLOOD SUGAR DAILY 100 each 2   acetaminophen  (TYLENOL ) 500 MG tablet Take 1,000 mg by mouth every 8 (eight) hours as needed  for mild pain (pain score 1-3), moderate pain (pain score 4-6) or headache.     AMBULATORY NON FORMULARY MEDICATION Diltiazem  2% compounded with lidocaine  5% ointment applied to the rectum 3 times daily for 8 weeks. 1 each 0   AMBULATORY NON FORMULARY MEDICATION Nitroglycerine ointment 0.125 %. Apply a pea sized amount internally four times daily. 30 g 2   Blood Glucose Monitoring Suppl (ACCU-CHEK GUIDE ME) w/Device KIT Use as directed 1 kit 0   Calcium Carb-Cholecalciferol (CALCIUM 600 + D PO) Take 2 tablets by mouth daily.     cetirizine (ZYRTEC) 10 MG tablet Take 10 mg by mouth daily.     chlorthalidone  (HYGROTON ) 25 MG tablet Take 1 tablet (25 mg total) by mouth daily. 90 tablet 1   famotidine  (PEPCID ) 40 MG tablet TAKE 1 TABLET BY MOUTH AT  BEDTIME 100 tablet 2   fluticasone  (FLONASE ) 50 MCG/ACT nasal spray Place 1 spray into the nose daily as needed for allergies.     furosemide  (LASIX ) 40 MG tablet Take 40 mg (1 tablet) daily for 3 days then take 40 mg (1 tablet) every other day 48 tablet 3   Glycerin-Hypromellose-PEG 400 (DRY EYE RELIEF DROPS) 0.2-0.2-1 % SOLN Place 1 drop into both eyes daily as needed (Dry eyes).     hydrocortisone  (ANUSOL -HC) 25 MG suppository Place 1 suppository (25 mg total) rectally 2 (two) times daily. 30 suppository 2   ketoconazole  (NIZORAL ) 2 % cream Apply 1 Application topically daily. 30 g 1   Magnesium  250 MG CAPS Take 250 mg by mouth in the morning.     Melatonin 5 MG CAPS Take 5 mg by mouth at bedtime.     Multiple Vitamin (MULTIVITAMIN WITH MINERALS) TABS tablet Take 1 tablet by mouth daily.     Multiple Vitamins-Minerals (HAIR SKIN & NAILS) TABS Take 1 tablet by mouth in the morning.     omeprazole  (PRILOSEC) 40 MG capsule TAKE 1 CAPSULE BY MOUTH TWICE  DAILY BEFORE MEALS 200 capsule 2   pioglitazone  (ACTOS ) 30 MG tablet TAKE 1 TABLET BY MOUTH DAILY 100 tablet 2   potassium chloride  SA (KLOR-CON  M) 20 MEQ tablet TAKE 2 TABLETS BY MOUTH TWICE  DAILY 400  tablet 2   pravastatin  (PRAVACHOL ) 40 MG tablet TAKE 1 TABLET BY MOUTH AT  BEDTIME 100 tablet 2   PRESCRIPTION MEDICATION Place 1 Application rectally daily as needed (Hemorrhoids). NIFEDIPINE 5% ointment     traZODone  (DESYREL ) 100 MG tablet TAKE 1 TABLET BY MOUTH BEFORE  BEDTIME 90 tablet 3   triamcinolone  ointment (KENALOG ) 0.1 % Apply 1 Application topically 2 (two) times daily as needed (yeast / itching). Mix with nystatin      valsartan  (DIOVAN ) 320 MG tablet TAKE 1 TABLET BY MOUTH DAILY 100 tablet 2   fluconazole  (DIFLUCAN ) 150 MG tablet 1 po then repeat once in 3 days 2 tablet 0   No facility-administered medications prior to visit.    Allergies  Allergen Reactions   Ace Inhibitors Cough   Farxiga [Dapagliflozin] Other (See Comments)    Yeast infections    Jardiance [Empagliflozin] Other (See Comments)    Yeast infections   Keflex [Cephalexin] Itching   Latex Other (See Comments)    Burn and itch   Doxycycline Rash   Metformin And Related Diarrhea   Nexium [Esomeprazole Magnesium ] Diarrhea   Protonix  [Pantoprazole  Sodium] Other (See Comments)    Constipation    Review of Systems  Constitutional:  Negative for fever.  Genitourinary:  Positive for vaginal discharge (and itching. thinks she is not over her yeast infection from last week.).       Objective:        03/31/2024    3:25 PM 03/22/2024    2:47 PM 03/04/2024   10:10 AM  Vitals with BMI  Height 5' 6 5' 6 5' 6  Weight 213 lbs 219 lbs 3 oz 216 lbs  BMI 34.4 35.4 34.88  Systolic 130 136 871  Diastolic 82 60 74  Pulse 79 87 72    No data found.   Physical Exam Vitals reviewed.  Constitutional:      Appearance: Normal appearance.  Cardiovascular:     Rate and Rhythm: Normal rate and regular rhythm.     Heart sounds: Normal heart sounds.  Pulmonary:     Effort: Pulmonary effort is normal.     Breath sounds: Normal breath sounds.  Musculoskeletal:     Comments: Left proximal anterior lower arm  nodule - tender. No erythema. No warmth.      Health Maintenance Due  Topic Date Due   COVID-19 Vaccine (8 - 2025-26 season) 12/29/2023    There are no preventive care reminders to display for this patient.   Lab Results  Component Value Date   TSH 2.100 02/04/2023   Lab Results  Component Value Date   WBC 7.7 01/30/2024   HGB 11.9 (L) 01/30/2024   HCT 36.0 01/30/2024   MCV 98.1 01/30/2024   PLT 227 01/30/2024   Lab Results  Component Value Date   NA 139 01/30/2024   K 3.6 01/30/2024   CO2 30 01/30/2024   GLUCOSE 151 (H) 01/30/2024   BUN 12 01/30/2024   CREATININE 0.95 01/30/2024   BILITOT 0.3 12/19/2023   ALKPHOS 73 12/19/2023   AST 29 12/19/2023   ALT 23 12/19/2023   PROT 6.7 12/19/2023   ALBUMIN  4.2 12/19/2023   CALCIUM 9.5 01/30/2024   ANIONGAP 9 01/30/2024   EGFR 59 (L) 01/19/2024   Lab Results  Component Value Date   CHOL 124 11/21/2023   Lab Results  Component Value Date   HDL 39 (L) 11/21/2023   Lab Results  Component Value Date  LDLCALC 63 11/21/2023   Lab Results  Component Value Date   TRIG 125 11/21/2023   Lab Results  Component Value Date   CHOLHDL 3.2 11/21/2023   Lab Results  Component Value Date   HGBA1C 6.7 03/04/2024        Results for orders placed or performed in visit on 03/22/24  POCT URINALYSIS DIP (CLINITEK)   Collection Time: 03/22/24  2:49 PM  Result Value Ref Range   Color, UA yellow yellow   Clarity, UA clear clear   Glucose, UA negative negative mg/dL   Bilirubin, UA negative negative   Ketones, POC UA negative negative mg/dL   Spec Grav, UA 8.989 8.989 - 1.025   Blood, UA negative negative   pH, UA 8.5 (A) 5.0 - 8.0   POC PROTEIN,UA negative negative, trace   Urobilinogen, UA 0.2 0.2 or 1.0 E.U./dL   Nitrite, UA Negative Negative   Leukocytes, UA Negative Negative     Assessment & Plan:   Assessment & Plan Pain in left arm Tender lump likely due to superficial thrombophlebitis vs lipoma.  Tenderness suggests superficial vein thrombosis. Not an emergency as it lacks deep vein thrombosis risk. - Ordered ultrasound of the left arm. - Advised warm, wet compresses for discomfort. - Discussed Aleve or ibuprofen for pain, ensuring no contraindications. Orders:   US  Venous Img Upper Uni Left (DVT); Future  Arm mass, left Tender lump likely due to superficial thrombophlebitis vs lipoma. Tenderness suggests superficial vein thrombosis. Not an emergency as it lacks deep vein thrombosis risk. - Ordered ultrasound of the left arm. - Advised warm, wet compresses for discomfort. - Discussed Aleve or ibuprofen for pain, ensuring no contraindications.  Orders:   US  Venous Img Upper Uni Left (DVT); Future  Yeast vaginitis Sent Diflucan  Orders:   fluconazole  (DIFLUCAN ) 150 MG tablet; Take one daily x 3 days  Gastro-esophageal reflux disease without esophagitis Managed with omeprazole  and another medication. Aleve may have contributed to irritation. - Continue current gastric medications as prescribed.      Body mass index is 34.38 kg/m..  Meds ordered this encounter  Medications   fluconazole  (DIFLUCAN ) 150 MG tablet    Sig: Take one daily x 3 days    Dispense:  3 tablet    Refill:  0    Orders Placed This Encounter  Procedures   US  Venous Img Upper Uni Left (DVT)     I,Marla I Leal-Borjas,acting as a scribe for Abigail Free, MD.,have documented all relevant documentation on the behalf of Abigail Free, MD,as directed by  Abigail Free, MD while in the presence of Abigail Free, MD.   Follow-up: No follow-ups on file.  An After Visit Summary was printed and given to the patient.  I attest that I have reviewed this visit and agree with the plan scribed by my staff.   Abigail Free, MD [facility] [phone]

## 2024-03-31 NOTE — Patient Instructions (Signed)
   VISIT SUMMARY: You visited us  today because of a sore lump on your arm and to discuss your ongoing stomach issues. We have ordered an ultrasound to investigate the lump further and provided recommendations for managing your discomfort. We also reviewed your current medications for stomach irritation.  YOUR PLAN: TENDER LUMP ON LEFT ARM: You have a sore, swollen lump on your arm that is likely due to superficial thrombophlebitis, which is a minor vein inflammation. -We have ordered an ultrasound of your left arm to get a clearer picture of what is going on. -Apply warm, wet compresses to the area to help with the discomfort. -You can take dual action ibuprofen/acetaminophen  for pain.  If I have ordered a referral, lab work, or a test, please watch for messages/letters in your [place]. Please be aware of unknown numbers, as this may be a specialist's office attempting to call and schedule your appointment. You may wish to enter the specialist's phone number in your contacts, so your phone will not block the calls as SPAM. If you have NOT been contacted with in 48 hours.  Please call the specialist's office  Call [facility].

## 2024-04-01 ENCOUNTER — Ambulatory Visit (INDEPENDENT_AMBULATORY_CARE_PROVIDER_SITE_OTHER)
Admission: RE | Admit: 2024-04-01 | Discharge: 2024-04-01 | Disposition: A | Source: Ambulatory Visit | Attending: Family Medicine | Admitting: Family Medicine

## 2024-04-01 DIAGNOSIS — M79602 Pain in left arm: Secondary | ICD-10-CM

## 2024-04-01 DIAGNOSIS — R2232 Localized swelling, mass and lump, left upper limb: Secondary | ICD-10-CM

## 2024-04-02 ENCOUNTER — Ambulatory Visit: Payer: Self-pay | Admitting: Family Medicine

## 2024-04-03 DIAGNOSIS — R2232 Localized swelling, mass and lump, left upper limb: Secondary | ICD-10-CM | POA: Insufficient documentation

## 2024-04-03 DIAGNOSIS — M79602 Pain in left arm: Secondary | ICD-10-CM | POA: Insufficient documentation

## 2024-04-03 DIAGNOSIS — B3731 Acute candidiasis of vulva and vagina: Secondary | ICD-10-CM | POA: Insufficient documentation

## 2024-04-03 NOTE — Assessment & Plan Note (Addendum)
 Sent Diflucan  Orders:   fluconazole  (DIFLUCAN ) 150 MG tablet; Take one daily x 3 days

## 2024-04-03 NOTE — Assessment & Plan Note (Addendum)
 Tender lump likely due to superficial thrombophlebitis. Tenderness suggests superficial vein thrombosis. Not an emergency as it lacks deep vein thrombosis risk. - Ordered ultrasound of the left arm. - Advised warm, wet compresses for discomfort. - Discussed Aleve or ibuprofen for pain, ensuring no contraindications. Orders:   US  Venous Img Upper Uni Left (DVT); Future

## 2024-04-03 NOTE — Assessment & Plan Note (Signed)
 Managed with omeprazole  and another medication. Aleve may have contributed to irritation. - Continue current gastric medications as prescribed.

## 2024-04-04 ENCOUNTER — Encounter: Payer: Self-pay | Admitting: Family Medicine

## 2024-04-16 ENCOUNTER — Other Ambulatory Visit: Payer: Self-pay | Admitting: Family Medicine

## 2024-04-16 DIAGNOSIS — Z1231 Encounter for screening mammogram for malignant neoplasm of breast: Secondary | ICD-10-CM

## 2024-04-26 ENCOUNTER — Ambulatory Visit: Admission: RE | Admit: 2024-04-26 | Discharge: 2024-04-26 | Disposition: A | Source: Ambulatory Visit

## 2024-04-26 DIAGNOSIS — Z1231 Encounter for screening mammogram for malignant neoplasm of breast: Secondary | ICD-10-CM

## 2024-04-30 ENCOUNTER — Ambulatory Visit: Payer: Self-pay | Admitting: Family Medicine

## 2024-05-10 ENCOUNTER — Other Ambulatory Visit: Payer: Self-pay

## 2024-05-11 ENCOUNTER — Telehealth: Payer: Self-pay | Admitting: Family Medicine

## 2024-05-11 ENCOUNTER — Other Ambulatory Visit: Payer: Self-pay

## 2024-05-11 DIAGNOSIS — M545 Low back pain, unspecified: Secondary | ICD-10-CM

## 2024-05-11 DIAGNOSIS — M25561 Pain in right knee: Secondary | ICD-10-CM

## 2024-05-11 NOTE — Telephone Encounter (Signed)
 Referrals placed

## 2024-05-11 NOTE — Telephone Encounter (Signed)
 Patient came into the office and stated that with her new insurance she needs a referral sent to Dr. Larnell at [facility] for her legs and Dr. Joshua at [facility] for her back. She stated that she also needs one sent to [facility].

## 2024-05-12 ENCOUNTER — Other Ambulatory Visit: Payer: Self-pay | Admitting: Family Medicine

## 2024-05-12 DIAGNOSIS — I10 Essential (primary) hypertension: Secondary | ICD-10-CM

## 2024-05-14 ENCOUNTER — Ambulatory Visit: Payer: Self-pay

## 2024-05-14 NOTE — Telephone Encounter (Signed)
 FYI Only or Action Required?: Action required by provider: request for appointment.  Patient was last seen in primary care on 03/31/2024 by Sherre Clapper, MD.  Called Nurse Triage reporting Leg Pain.  Symptoms began several days ago.  Interventions attempted: Nothing.  Symptoms are: unchanged.  Triage Disposition: See HCP Within 4 Hours (Or PCP Triage)  Patient/caregiver understands and will follow disposition?: No, wishes to speak with PCP

## 2024-05-14 NOTE — Telephone Encounter (Signed)
 Reason for Triage: patient called stated her legs are giving her some problems as they are red, sore and swolle. She says the swelling makes it difficult to walk and her veins are popping out more.          Reason for Disposition  [1] Red area or streak AND [2] large (> 2 inches or 5 cm)  Answer Assessment - Initial Assessment Questions Bilateral leg pain, redness and swelling. States there are spots on both legs on the inside towards the ankle of redness that is about 5 inches. She states right is greater than left. She is also having some pain with walking, burning sensation, tender to touch, veins are staring to really show on her legs. She states both legs are swollen from ankle to knees. Denies any chest pain or shortness of breath. Rn advised UC as recs are to be seen today. Pt declined and states she would really rather see Dr. Sherre. Rn advised will send message requesting appt. Rn did give rationale as to why she should be seen today but wants to see Dr. Sherre.     1. ONSET: When did the pain start?      2-3 days ago 2. LOCATION: Where is the pain located?      Bilateral legs 3. PAIN: How bad is the pain?    (Scale 1-10; or mild, moderate, severe)     Mod to severe 4. WORK OR EXERCISE: Has there been any recent work or exercise that involved this part of the body?      unknown 5. CAUSE: What do you think is causing the leg pain?     unknown 6. OTHER SYMPTOMS: Do you have any other symptoms? (e.g., chest pain, back pain, breathing difficulty, swelling, rash, fever, numbness, weakness)     Swelling, redness  Protocols used: Leg Pain-A-AH

## 2024-05-17 ENCOUNTER — Other Ambulatory Visit: Payer: Self-pay | Admitting: Family Medicine

## 2024-05-17 ENCOUNTER — Telehealth: Payer: Self-pay

## 2024-05-17 DIAGNOSIS — M25552 Pain in left hip: Secondary | ICD-10-CM

## 2024-05-17 DIAGNOSIS — G8929 Other chronic pain: Secondary | ICD-10-CM

## 2024-05-17 NOTE — Telephone Encounter (Signed)
 Patient was informed.

## 2024-05-17 NOTE — Telephone Encounter (Signed)
 Copied from CRM [id number]. Topic: Referral - Status >> May 17, 2024 10:43 AM Corin V wrote: Reason for CRM: Patient called in stating Dr. Larnell and Dr. Joshua offices have not received the referrals for pain medicine and orthopedic surgery. She is supposed to see Dr. Larnell (ortho) tomorrow and the office stated they have not received the referral and [facility] care has not received the referral either. Please resend referrals today and call to ensure received so patient can still have that appointment if possible tomorrow and does not have to be rescheduled. CB: [phone]

## 2024-05-17 NOTE — Telephone Encounter (Signed)
DONE. DR. [name]

## 2024-05-18 ENCOUNTER — Ambulatory Visit: Admitting: Family Medicine

## 2024-05-18 VITALS — BP 132/76 | HR 79 | Temp 97.9°F | Ht 66.0 in | Wt 223.7 lb

## 2024-05-18 DIAGNOSIS — M79674 Pain in right toe(s): Secondary | ICD-10-CM

## 2024-05-18 DIAGNOSIS — M79675 Pain in left toe(s): Secondary | ICD-10-CM | POA: Diagnosis not present

## 2024-05-18 DIAGNOSIS — S82291A Other fracture of shaft of right tibia, initial encounter for closed fracture: Secondary | ICD-10-CM

## 2024-05-18 DIAGNOSIS — M8589 Other specified disorders of bone density and structure, multiple sites: Secondary | ICD-10-CM

## 2024-05-18 NOTE — Progress Notes (Signed)
 "  Acute Office Visit  Subjective:    Patient ID: Megan Galvan, female    DOB: March 03, 1951, 74 y.o.   MRN: [id number]  Chief Complaint  Patient presents with   Leg Pain    Right leg. States she seen Dr. Larnell this morning was told she has a torn meniscus, stress fracture and advised to rest and do exercises.     Discussed the use of AI scribe software for clinical note transcription with the patient, who gave verbal consent to proceed.  History of Present Illness Megan Galvan is a 74 year old female who presents with right leg pain.  Right leg pain - Acute onset of right leg pain beginning this morning - Known torn meniscus in the right knee - Patient saw Dr. Larnell this morning and was diagnosed with a right proximal tibial stress fracture - No recent injury - Decreased ambulation due to husband's health issues and providing him care.   Osteopenia - Mild worsening of osteopenia - Currently taking calcium with vitamin D  twice daily - Bone density test performed on Sep 11, 2022 - Next bone density test due in 2026  Polyarticular joint pain and gout - History of gout diagnosed last fall - Persistent pain in toes and fingers, especially big toe and thumbs - No redness or heat in affected joints - Uses Tylenol  and occasionally pregabalin  once daily at night   Peripheral neuropathy - Numbness in the fifth toe  Immunization status concern - Concerned about measles cases - Uncertain of measles vaccination status - Recalls having measles as a child    Past Medical History:  Diagnosis Date   Acute cystitis without hematuria 06/21/2022   Acute pain of right knee 02/02/2024   Allergic sinusitis 08/27/2022   Atrophic vaginitis 03/31/2021   Candidiasis, intertrigo 12/21/2023   Cataract    Chronic diastolic CHF (congestive heart failure), NYHA class 2 (HCC) 11/17/2023   Class 1 obesity due to excess calories with serious comorbidity and body mass index (BMI) of 33.0 to  33.9 in adult 03/28/2022   Class 1 obesity due to excess calories with serious comorbidity and body mass index (BMI) of 34.0 to 34.9 in adult 03/28/2022   Class 2 severe obesity with serious comorbidity and body mass index (BMI) of 37.0 to 37.9 in adult 06/07/2019   Coronary atherosclerosis 01/21/2024   Dysuria 04/07/2021   Encounter for osteoporosis screening in asymptomatic postmenopausal patient 06/28/2022   Essential hypertension 05/20/2023   External hemorrhoids 11/12/2021   Gastro-esophageal reflux disease without esophagitis 06/03/2019   History of kidney stones    Joint pain 05/20/2023   Mixed hyperlipidemia    Mucositis (ulcerative) of vagina and vulva 06/21/2022   Myalgia 06/07/2019   Neck pain, chronic 10/18/2022   Need for hepatitis C screening test 11/23/2023   Numbness in right leg 09/08/2023   Pain in Galvan hands 02/08/2023   Paresthesias 09/04/2021   Paroxysmal atrial fibrillation (HCC) 11/23/2023   Primary insomnia 06/03/2019   Rectal burning 12/03/2022   Rectal itching 12/03/2022   Rectal pain 09/04/2021   S/P aortic valve replacement with bioprosthetic valve 08/10/2020   21 mm Edwards Resilia Inspiria Bioprosthetic Tissue Valve  SN [id number] Model [id number]   S/P cervical spinal fusion 02/19/2023   Sleep apnea    cpap   Type 2 diabetes mellitus with diabetic polyneuropathy, without long-term current use of insulin  (HCC) 06/03/2019   Venous insufficiency of Galvan lower extremities 09/08/2023    Past Surgical History:  Procedure Laterality Date   ANTERIOR CERVICAL DECOMP/DISCECTOMY FUSION     x 2   ANTERIOR CERVICAL DECOMP/DISCECTOMY FUSION N/A 02/19/2023   Procedure: Anterior Cervical Discectomy Fusion-Cervical Three-Cervical Four, Removal Plate Cervical Four-Cervical Five;  Surgeon: Joshua Alm Hamilton, MD;  Location: [facility] OR;  Service: Neurosurgery;  Laterality: N/A;  3C   AORTIC VALVE REPLACEMENT N/A 08/10/2020   Procedure: AORTIC VALVE REPLACEMENT (AVR) USING  INSPIRIS VALVE SIZE ;  Surgeon: Dusty Sudie DEL, MD;  Location: [facility] OR;  Service: Open Heart Surgery;  Laterality: N/A;   BACK SURGERY  11/17/2016   L3-L5   BILATERAL CARPAL TUNNEL RELEASE     bone spur Left    left shoulder   bone spur Right    Right shoulder   CATARACT EXTRACTION Left 09/18/2021   COLONOSCOPY  08/22/2011   Moderate predominantly sigmoid diverticulosis. Small internal hemorrhoids.    COLONOSCOPY     HEMORROIDECTOMY  02/2019   LEFT HEART CATH AND CORONARY ANGIOGRAPHY N/A 01/28/2024   Procedure: LEFT HEART CATH AND CORONARY ANGIOGRAPHY;  Surgeon: Ladona Heinz, MD;  Location: [facility] INVASIVE CV LAB;  Service: Cardiovascular;  Laterality: N/A;   RIGHT/LEFT HEART CATH AND CORONARY ANGIOGRAPHY N/A 07/27/2020   Procedure: RIGHT/LEFT HEART CATH AND CORONARY ANGIOGRAPHY;  Surgeon: Wonda Sharper, MD;  Location: [facility] INVASIVE CV LAB;  Service: Cardiovascular;  Laterality: N/A;   TEE WITHOUT CARDIOVERSION N/A 08/10/2020   Procedure: TRANSESOPHAGEAL ECHOCARDIOGRAM (TEE);  Surgeon: Dusty Sudie DEL, MD;  Location: [facility] OR;  Service: Open Heart Surgery;  Laterality: N/A;   torn rotator cuff Right    Right shoulder   torn rotator cuff Left    left shoulder   TRIGGER FINGER RELEASE Left    thumb   TRIGGER FINGER RELEASE Right    thumb and middle finger   UPPER GASTROINTESTINAL ENDOSCOPY     WRIST SURGERY Left    torn ligament   WRIST SURGERY Right    cyst    Family History  Problem Relation Age of Onset   Diabetes Mother    Heart Problems Mother    Colon cancer Brother 28   Heart Problems Maternal Grandfather    Colon cancer Paternal Grandmother    Esophageal cancer Neg Hx    Rectal cancer Neg Hx    Stomach cancer Neg Hx    Breast cancer Neg Hx     Social History   Socioeconomic History   Marital status: Married    Spouse name: Eddie   Number of children: 2   Years of education: Not on file   Highest education level: Not on file  Occupational History   Not on file   Tobacco Use   Smoking status: Never   Smokeless tobacco: Never  Vaping Use   Vaping status: Never Used  Substance and Sexual Activity   Alcohol use: Never   Drug use: Never   Sexual activity: Yes    Birth control/protection: Post-menopausal  Other Topics Concern   Not on file  Social History Narrative   One son lives in home, the other son lives about 3 miles away   Social Drivers of Health   Tobacco Use: Low Risk (05/23/2024)   Patient History    Smoking Tobacco Use: Never    Smokeless Tobacco Use: Never    Passive Exposure: Not on file  Financial Resource Strain: Low Risk (09/25/2023)   Overall Financial Resource Strain (CARDIA)    Difficulty of Paying Living Expenses: Not hard at all  Food Insecurity:  No Food Insecurity (09/25/2023)   Hunger Vital Sign    Worried About Running Out of Food in the Last Year: Never true    Ran Out of Food in the Last Year: Never true  Transportation Needs: No Transportation Needs (09/25/2023)   PRAPARE - Administrator, Civil Service (Medical): No    Lack of Transportation (Non-Medical): No  Physical Activity: Inactive (09/25/2023)   Exercise Vital Sign    Days of Exercise per Week: 0 days    Minutes of Exercise per Session: 0 min  Stress: No Stress Concern Present (09/25/2023)   [employer] of Occupational Health - Occupational Stress Questionnaire    Feeling of Stress : Not at all  Social Connections: Moderately Integrated (09/25/2023)   Social Connection and Isolation Panel    Frequency of Communication with Friends and Family: More than three times a week    Frequency of Social Gatherings with Friends and Family: More than three times a week    Attends Religious Services: More than 4 times per year    Active Member of [employer] or Organizations: No    Attends Banker Meetings: Never    Marital Status: Married  Catering Manager Violence: Not At Risk (09/25/2023)   Humiliation, Afraid, Rape, and Kick  questionnaire    Fear of Current or Ex-Partner: No    Emotionally Abused: No    Physically Abused: No    Sexually Abused: No  Depression (PHQ2-9): Low Risk (01/23/2024)   Depression (PHQ2-9)    PHQ-2 Score: 0  Alcohol Screen: Low Risk (09/25/2023)   Alcohol Screen    Last Alcohol Screening Score (AUDIT): 0  Housing: Low Risk (09/25/2023)   Housing Stability Vital Sign    Unable to Pay for Housing in the Last Year: No    Number of Times Moved in the Last Year: 0    Homeless in the Last Year: No  Utilities: Not At Risk (09/25/2023)   AHC Utilities    Threatened with loss of utilities: No  Health Literacy: Adequate Health Literacy (09/25/2023)   B1300 Health Literacy    Frequency of need for help with medical instructions: Never    Outpatient Medications Prior to Visit  Medication Sig Dispense Refill   ACCU-CHEK GUIDE TEST test strip USE 1 STRIP WITH METER DAILY IN  THE AFTERNOON 100 strip 2   Accu-Chek Softclix Lancets lancets CHECK BLOOD SUGAR DAILY 100 each 2   acetaminophen  (TYLENOL ) 500 MG tablet Take 1,000 mg by mouth every 8 (eight) hours as needed for mild pain (pain score 1-3), moderate pain (pain score 4-6) or headache.     AMBULATORY NON FORMULARY MEDICATION Diltiazem  2% compounded with lidocaine  5% ointment applied to the rectum 3 times daily for 8 weeks. 1 each 0   AMBULATORY NON FORMULARY MEDICATION Nitroglycerine ointment 0.125 %. Apply a pea sized amount internally four times daily. 30 g 2   Blood Glucose Monitoring Suppl (ACCU-CHEK GUIDE ME) w/Device KIT Use as directed 1 kit 0   Calcium Carb-Cholecalciferol (CALCIUM 600 + D PO) Take 2 tablets by mouth daily.     cetirizine (ZYRTEC) 10 MG tablet Take 10 mg by mouth daily.     chlorthalidone  (HYGROTON ) 25 MG tablet TAKE 1 TABLET BY MOUTH DAILY 100 tablet 2   famotidine  (PEPCID ) 40 MG tablet TAKE 1 TABLET BY MOUTH AT  BEDTIME 100 tablet 2   fluticasone  (FLONASE ) 50 MCG/ACT nasal spray Place 1 spray into the nose daily as  needed for allergies.     furosemide  (LASIX ) 40 MG tablet Take 40 mg (1 tablet) daily for 3 days then take 40 mg (1 tablet) every other day 48 tablet 3   Glycerin-Hypromellose-PEG 400 (DRY EYE RELIEF DROPS) 0.2-0.2-1 % SOLN Place 1 drop into Galvan eyes daily as needed (Dry eyes).     hydrocortisone  (ANUSOL -HC) 25 MG suppository Place 1 suppository (25 mg total) rectally 2 (two) times daily. 30 suppository 2   ketoconazole  (NIZORAL ) 2 % cream Apply 1 Application topically daily. 30 g 1   Magnesium  250 MG CAPS Take 250 mg by mouth in the morning.     Melatonin 5 MG CAPS Take 5 mg by mouth at bedtime.     Multiple Vitamin (MULTIVITAMIN WITH MINERALS) TABS tablet Take 1 tablet by mouth daily.     Multiple Vitamins-Minerals (HAIR SKIN & NAILS) TABS Take 1 tablet by mouth in the morning.     omeprazole  (PRILOSEC) 40 MG capsule TAKE 1 CAPSULE BY MOUTH TWICE  DAILY BEFORE MEALS 200 capsule 2   pioglitazone  (ACTOS ) 30 MG tablet TAKE 1 TABLET BY MOUTH DAILY 100 tablet 2   potassium chloride  SA (KLOR-CON  M) 20 MEQ tablet TAKE 2 TABLETS BY MOUTH TWICE  DAILY 400 tablet 2   pravastatin  (PRAVACHOL ) 40 MG tablet TAKE 1 TABLET BY MOUTH AT  BEDTIME 100 tablet 2   PRESCRIPTION MEDICATION Place 1 Application rectally daily as needed (Hemorrhoids). NIFEDIPINE 5% ointment     traZODone  (DESYREL ) 100 MG tablet TAKE 1 TABLET BY MOUTH BEFORE  BEDTIME 90 tablet 3   triamcinolone  ointment (KENALOG ) 0.1 % Apply 1 Application topically 2 (two) times daily as needed (yeast / itching). Mix with nystatin      valsartan  (DIOVAN ) 320 MG tablet TAKE 1 TABLET BY MOUTH DAILY 100 tablet 2   fluconazole  (DIFLUCAN ) 150 MG tablet Take one daily x 3 days 3 tablet 0   No facility-administered medications prior to visit.    Allergies[1]  Review of Systems  Constitutional:  Negative for chills, fatigue and fever.  HENT:  Negative for congestion, ear pain and sore throat.   Respiratory:  Negative for cough and shortness of breath.    Cardiovascular:  Negative for chest pain.  Genitourinary:  Negative for dysuria and urgency.  Musculoskeletal:  Positive for arthralgias. Negative for myalgias.  Skin:  Negative for rash.  Neurological:  Negative for dizziness and headaches.  Psychiatric/Behavioral:  Negative for dysphoric mood. The patient is not nervous/anxious.        Objective:        05/18/2024    3:49 PM 03/31/2024    3:25 PM 03/22/2024    2:47 PM  Vitals with BMI  Height 5' 6 5' 6 5' 6  Weight 223 lbs 11 oz 213 lbs 219 lbs 3 oz  BMI 36.12 34.4 35.4  Systolic 132 130 863  Diastolic 76 82 60  Pulse 79 79 87    No data found.   Physical Exam Vitals reviewed.  Constitutional:      Appearance: Normal appearance. She is obese.  Cardiovascular:     Rate and Rhythm: Normal rate and regular rhythm.     Heart sounds: Normal heart sounds.  Pulmonary:     Effort: Pulmonary effort is normal. No respiratory distress.     Breath sounds: Normal breath sounds.  Musculoskeletal:        General: Tenderness (over proximal right tibia. hand and toes tender. bouchards and heberdens nodule BL.) present.  Neurological:     Mental Status: She is alert and oriented to person, place, and time.  Psychiatric:        Mood and Affect: Mood normal.        Behavior: Behavior normal.     Health Maintenance Due  Topic Date Due   Zoster Vaccines- Shingrix (1 of 2) 05/28/2001   COVID-19 Vaccine (8 - 2025-26 season) 12/29/2023   Diabetic kidney evaluation - Urine ACR  05/19/2024    There are no preventive care reminders to display for this patient.   Lab Results  Component Value Date   TSH 2.100 02/04/2023   Lab Results  Component Value Date   WBC 7.7 01/30/2024   HGB 11.9 (L) 01/30/2024   HCT 36.0 01/30/2024   MCV 98.1 01/30/2024   PLT 227 01/30/2024   Lab Results  Component Value Date   NA 139 01/30/2024   K 3.6 01/30/2024   CO2 30 01/30/2024   GLUCOSE 151 (H) 01/30/2024   BUN 12 01/30/2024    CREATININE 0.95 01/30/2024   BILITOT 0.3 12/19/2023   ALKPHOS 73 12/19/2023   AST 29 12/19/2023   ALT 23 12/19/2023   PROT 6.7 12/19/2023   ALBUMIN  4.2 12/19/2023   CALCIUM 9.6 05/18/2024   ANIONGAP 9 01/30/2024   EGFR 59 (L) 01/19/2024   Lab Results  Component Value Date   CHOL 124 11/21/2023   Lab Results  Component Value Date   HDL 39 (L) 11/21/2023   Lab Results  Component Value Date   LDLCALC 63 11/21/2023   Lab Results  Component Value Date   TRIG 125 11/21/2023   Lab Results  Component Value Date   CHOLHDL 3.2 11/21/2023   Lab Results  Component Value Date   HGBA1C 6.7 03/04/2024        Results for orders placed or performed in visit on 05/18/24  PTH, Intact and Calcium   Collection Time: 05/18/24  4:40 PM  Result Value Ref Range   Calcium 9.6 8.7 - 10.3 mg/dL   PTH 23 15 - 65 pg/mL   PTH Interp Comment   VITAMIN D  25 Hydroxy (Vit-D Deficiency, Fractures)   Collection Time: 05/18/24  4:40 PM  Result Value Ref Range   Vit D, 25-Hydroxy 43.1 30.0 - 100.0 ng/mL  Uric acid   Collection Time: 05/18/24  4:40 PM  Result Value Ref Range   Uric Acid 6.3 3.1 - 7.9 mg/dL     Assessment & Plan:   Assessment & Plan Other closed fracture of shaft of right tibia, initial encounter Chronic stress fracture - concerned may be pathological. Will check bone density early.  - Continue rest and exercises as advised by previous provider. - Ordered bone density test to assess for osteoporosis. - Ordered vitamin D , calcium, and parathyroid hormone levels.  Orders:   PTH, Intact and Calcium   VITAMIN D  25 Hydroxy (Vit-D Deficiency, Fractures)   Uric acid  Osteopenia of multiple sites Mild worsening osteopenia noted on previous bone density test. - Continue calcium with vitamin D  supplementation. - Ordered bone density test to monitor progression in light of tibial fracture Orders:   PTH, Intact and Calcium   VITAMIN D  25 Hydroxy (Vit-D Deficiency, Fractures)    Uric acid   DG Bone Density; Future  Pain in toes of Galvan feet History of gout, but her joint pain is multiple joints and unlikely to be gout. 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  - Ordered uric acid level to assess  current status. - Continue Tylenol  for pain management. - Avoid gabapentin  due to adverse effects. Orders:   Uric acid    Body mass index is 36.11 kg/m.SABRA  No orders of the defined types were placed in this encounter.   Orders Placed This Encounter  Procedures   DG Bone Density   PTH, Intact and Calcium   VITAMIN D  25 Hydroxy (Vit-D Deficiency, Fractures)   Uric acid     I,Marla I Leal-Borjas,acting as a scribe for Abigail Free, MD.,have documented all relevant documentation on the behalf of Abigail Free, MD,as directed by  Abigail Free, MD while in the presence of Abigail Free, MD.   Follow-up: as needed   An After Visit Summary was printed and given to the patient.  I attest that I have reviewed this visit and agree with the plan scribed by my staff.   Abigail Free, MD [facility] [phone]       [1]  Allergies Allergen Reactions   Ace Inhibitors Cough   Farxiga [Dapagliflozin] Other (See Comments)    Yeast infections    Jardiance [Empagliflozin] Other (See Comments)    Yeast infections   Keflex [Cephalexin] Itching   Latex Other (See Comments)    Burn and itch   Doxycycline Rash   Metformin And Related Diarrhea   Nexium [Esomeprazole Magnesium ] Diarrhea   Protonix  [Pantoprazole  Sodium] Other (See Comments)    Constipation   "

## 2024-05-19 LAB — VITAMIN D 25 HYDROXY (VIT D DEFICIENCY, FRACTURES): Vit D, 25-Hydroxy: 43.1 ng/mL (ref 30.0–100.0)

## 2024-05-19 LAB — PTH, INTACT AND CALCIUM
Calcium: 9.6 mg/dL (ref 8.7–10.3)
PTH: 23 pg/mL (ref 15–65)

## 2024-05-19 LAB — URIC ACID: Uric Acid: 6.3 mg/dL (ref 3.1–7.9)

## 2024-05-20 ENCOUNTER — Ambulatory Visit: Payer: Self-pay | Admitting: Family Medicine

## 2024-05-21 ENCOUNTER — Other Ambulatory Visit: Payer: Self-pay | Admitting: Family Medicine

## 2024-05-21 DIAGNOSIS — S82201A Unspecified fracture of shaft of right tibia, initial encounter for closed fracture: Secondary | ICD-10-CM | POA: Insufficient documentation

## 2024-05-21 DIAGNOSIS — M79674 Pain in right toe(s): Secondary | ICD-10-CM | POA: Insufficient documentation

## 2024-05-21 DIAGNOSIS — M8589 Other specified disorders of bone density and structure, multiple sites: Secondary | ICD-10-CM | POA: Insufficient documentation

## 2024-05-21 MED ORDER — MELOXICAM 7.5 MG PO TABS: 7.5000 mg | ORAL_TABLET | Freq: Two times a day (BID) | ORAL | 0 refills | Status: AC

## 2024-05-21 NOTE — Assessment & Plan Note (Signed)
 Mild worsening osteopenia noted on previous bone density test. - Continue calcium with vitamin D  supplementation. - Ordered bone density test to monitor progression. Orders:   PTH, Intact and Calcium   VITAMIN D  25 Hydroxy (Vit-D Deficiency, Fractures)   Uric acid   DG Bone Density; Future

## 2024-05-21 NOTE — Assessment & Plan Note (Signed)
 Chronic stress fracture, likely age-related, with persistent pain despite rest and exercises. - Continue rest and exercises as advised by previous provider. - Ordered bone density test to assess for osteoporosis. - Ordered vitamin D , calcium, and parathyroid hormone levels. Orders:   PTH, Intact and Calcium   VITAMIN D  25 Hydroxy (Vit-D Deficiency, Fractures)   Uric acid

## 2024-05-21 NOTE — Assessment & Plan Note (Signed)
 History of gout, but her joint pain is multiple joints and unlikely to be gout. 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  - Ordered uric acid level to assess current status. - Continue Tylenol  for pain management. - Avoid gabapentin  due to adverse effects. Orders:   Uric acid

## 2024-05-23 ENCOUNTER — Encounter: Payer: Self-pay | Admitting: Family Medicine

## 2024-05-28 ENCOUNTER — Ambulatory Visit (INDEPENDENT_AMBULATORY_CARE_PROVIDER_SITE_OTHER)
Admission: RE | Admit: 2024-05-28 | Discharge: 2024-05-28 | Disposition: A | Source: Ambulatory Visit | Attending: Family Medicine | Admitting: Family Medicine

## 2024-05-28 DIAGNOSIS — M8580 Other specified disorders of bone density and structure, unspecified site: Secondary | ICD-10-CM | POA: Diagnosis not present

## 2024-05-28 DIAGNOSIS — M8589 Other specified disorders of bone density and structure, multiple sites: Secondary | ICD-10-CM | POA: Diagnosis not present

## 2024-06-10 ENCOUNTER — Ambulatory Visit: Admitting: Family Medicine

## 2024-06-10 DIAGNOSIS — E782 Mixed hyperlipidemia: Secondary | ICD-10-CM

## 2024-06-10 DIAGNOSIS — E1142 Type 2 diabetes mellitus with diabetic polyneuropathy: Secondary | ICD-10-CM

## 2024-09-30 ENCOUNTER — Ambulatory Visit
